# Patient Record
Sex: Female | Born: 1935
Health system: Southern US, Community
[De-identification: ages and names within clinical notes are randomized; demographics above are authoritative.]

## PROBLEM LIST (undated history)

## (undated) DIAGNOSIS — E669 Obesity, unspecified: Secondary | ICD-10-CM

## (undated) DIAGNOSIS — L509 Urticaria, unspecified: Secondary | ICD-10-CM

## (undated) DIAGNOSIS — G8929 Other chronic pain: Secondary | ICD-10-CM

## (undated) DIAGNOSIS — R569 Unspecified convulsions: Secondary | ICD-10-CM

## (undated) DIAGNOSIS — R29818 Other symptoms and signs involving the nervous system: Secondary | ICD-10-CM

## (undated) DIAGNOSIS — M109 Gout, unspecified: Secondary | ICD-10-CM

## (undated) DIAGNOSIS — M48062 Spinal stenosis, lumbar region with neurogenic claudication: Secondary | ICD-10-CM

## (undated) DIAGNOSIS — R609 Edema, unspecified: Secondary | ICD-10-CM

## (undated) DIAGNOSIS — K648 Other hemorrhoids: Secondary | ICD-10-CM

## (undated) DIAGNOSIS — G40909 Epilepsy, unspecified, not intractable, without status epilepticus: Secondary | ICD-10-CM

## (undated) DIAGNOSIS — M545 Low back pain, unspecified: Secondary | ICD-10-CM

## (undated) DIAGNOSIS — R51 Headache: Secondary | ICD-10-CM

## (undated) DIAGNOSIS — I639 Cerebral infarction, unspecified: Secondary | ICD-10-CM

## (undated) DIAGNOSIS — L309 Dermatitis, unspecified: Secondary | ICD-10-CM

## (undated) DIAGNOSIS — I10 Essential (primary) hypertension: Secondary | ICD-10-CM

## (undated) DIAGNOSIS — R519 Headache, unspecified: Secondary | ICD-10-CM

## (undated) DIAGNOSIS — K449 Diaphragmatic hernia without obstruction or gangrene: Secondary | ICD-10-CM

## (undated) DIAGNOSIS — G9519 Other vascular myelopathies: Secondary | ICD-10-CM

## (undated) DIAGNOSIS — M1990 Unspecified osteoarthritis, unspecified site: Secondary | ICD-10-CM

## (undated) HISTORY — PX: RE-EXCISION OF BREAST LUMPECTOMY: SHX6048

## (undated) HISTORY — DX: Cerebral infarction, unspecified: I63.9

## (undated) HISTORY — DX: Edema, unspecified: R60.9

## (undated) HISTORY — DX: Other symptoms and signs involving the nervous system: R29.818

## (undated) HISTORY — PX: TUBAL LIGATION: SHX77

## (undated) HISTORY — DX: Other chronic pain: G89.29

## (undated) HISTORY — DX: Urticaria, unspecified: L50.9

## (undated) HISTORY — PX: CHOLECYSTECTOMY: SHX55

## (undated) HISTORY — PX: OTHER SURGICAL HISTORY: SHX169

## (undated) HISTORY — DX: Epilepsy, unspecified, not intractable, without status epilepticus: G40.909

## (undated) HISTORY — DX: Obesity, unspecified: E66.9

## (undated) HISTORY — PX: APPENDECTOMY: SHX54

## (undated) HISTORY — DX: Low back pain: M54.5

## (undated) HISTORY — PX: ABDOMINAL HYSTERECTOMY: SHX81

## (undated) HISTORY — DX: Headache: R51

## (undated) HISTORY — DX: Other hemorrhoids: K64.8

## (undated) HISTORY — DX: Other vascular myelopathies: G95.19

## (undated) HISTORY — DX: Diaphragmatic hernia without obstruction or gangrene: K44.9

## (undated) HISTORY — DX: Unspecified osteoarthritis, unspecified site: M19.90

## (undated) HISTORY — DX: Spinal stenosis, lumbar region with neurogenic claudication: M48.062

## (undated) HISTORY — DX: Headache, unspecified: R51.9

## (undated) HISTORY — DX: Low back pain, unspecified: M54.50

## (undated) HISTORY — DX: Unspecified convulsions: R56.9

## (undated) HISTORY — PX: NEUROPLASTY / TRANSPOSITION MEDIAN NERVE AT CARPAL TUNNEL BILATERAL: SUR894

## (undated) HISTORY — DX: Gout, unspecified: M10.9

## (undated) HISTORY — DX: Dermatitis, unspecified: L30.9

## (undated) HISTORY — DX: Essential (primary) hypertension: I10

---

## 2000-03-19 ENCOUNTER — Ambulatory Visit (HOSPITAL_COMMUNITY): Admission: RE | Admit: 2000-03-19 | Discharge: 2000-03-19 | Payer: Self-pay | Admitting: Gastroenterology

## 2000-07-05 ENCOUNTER — Encounter (INDEPENDENT_AMBULATORY_CARE_PROVIDER_SITE_OTHER): Payer: Self-pay | Admitting: Specialist

## 2000-07-05 ENCOUNTER — Encounter: Payer: Self-pay | Admitting: Family Medicine

## 2000-07-05 ENCOUNTER — Encounter: Payer: Self-pay | Admitting: Internal Medicine

## 2000-07-05 ENCOUNTER — Inpatient Hospital Stay (HOSPITAL_COMMUNITY): Admission: EM | Admit: 2000-07-05 | Discharge: 2000-07-11 | Payer: Self-pay | Admitting: Emergency Medicine

## 2000-07-05 ENCOUNTER — Encounter: Payer: Self-pay | Admitting: Emergency Medicine

## 2000-07-07 ENCOUNTER — Encounter: Payer: Self-pay | Admitting: Internal Medicine

## 2000-07-09 ENCOUNTER — Encounter: Payer: Self-pay | Admitting: Internal Medicine

## 2000-08-19 ENCOUNTER — Ambulatory Visit (HOSPITAL_COMMUNITY): Admission: RE | Admit: 2000-08-19 | Discharge: 2000-08-19 | Payer: Self-pay | Admitting: Family Medicine

## 2000-08-19 ENCOUNTER — Encounter: Payer: Self-pay | Admitting: Family Medicine

## 2001-01-20 ENCOUNTER — Other Ambulatory Visit: Admission: RE | Admit: 2001-01-20 | Discharge: 2001-01-20 | Payer: Self-pay | Admitting: Family Medicine

## 2001-06-12 ENCOUNTER — Inpatient Hospital Stay (HOSPITAL_COMMUNITY): Admission: RE | Admit: 2001-06-12 | Discharge: 2001-06-16 | Payer: Self-pay | Admitting: Specialist

## 2001-06-15 ENCOUNTER — Encounter: Payer: Self-pay | Admitting: Specialist

## 2001-06-16 ENCOUNTER — Inpatient Hospital Stay (HOSPITAL_COMMUNITY)
Admission: RE | Admit: 2001-06-16 | Discharge: 2001-06-23 | Payer: Self-pay | Admitting: Physical Medicine & Rehabilitation

## 2001-07-22 ENCOUNTER — Encounter: Admission: RE | Admit: 2001-07-22 | Discharge: 2001-09-11 | Payer: Self-pay | Admitting: *Deleted

## 2001-09-10 ENCOUNTER — Ambulatory Visit (HOSPITAL_COMMUNITY): Admission: RE | Admit: 2001-09-10 | Discharge: 2001-09-10 | Payer: Self-pay | Admitting: Family Medicine

## 2001-09-10 ENCOUNTER — Encounter: Payer: Self-pay | Admitting: Family Medicine

## 2001-10-22 ENCOUNTER — Ambulatory Visit (HOSPITAL_COMMUNITY): Admission: RE | Admit: 2001-10-22 | Discharge: 2001-10-22 | Payer: Self-pay | Admitting: Gastroenterology

## 2001-10-22 ENCOUNTER — Encounter (INDEPENDENT_AMBULATORY_CARE_PROVIDER_SITE_OTHER): Payer: Self-pay | Admitting: Specialist

## 2001-12-01 ENCOUNTER — Encounter: Admission: RE | Admit: 2001-12-01 | Discharge: 2002-01-13 | Payer: Self-pay | Admitting: Specialist

## 2002-01-26 ENCOUNTER — Ambulatory Visit: Admission: RE | Admit: 2002-01-26 | Discharge: 2002-01-26 | Payer: Self-pay | Admitting: Specialist

## 2002-03-07 ENCOUNTER — Emergency Department (HOSPITAL_COMMUNITY): Admission: EM | Admit: 2002-03-07 | Discharge: 2002-03-07 | Payer: Self-pay | Admitting: *Deleted

## 2002-03-07 ENCOUNTER — Encounter: Payer: Self-pay | Admitting: *Deleted

## 2002-09-14 ENCOUNTER — Encounter: Payer: Self-pay | Admitting: Family Medicine

## 2002-09-14 ENCOUNTER — Ambulatory Visit (HOSPITAL_COMMUNITY): Admission: RE | Admit: 2002-09-14 | Discharge: 2002-09-14 | Payer: Self-pay | Admitting: Family Medicine

## 2003-02-03 ENCOUNTER — Encounter: Payer: Self-pay | Admitting: Specialist

## 2003-02-09 ENCOUNTER — Inpatient Hospital Stay (HOSPITAL_COMMUNITY): Admission: RE | Admit: 2003-02-09 | Discharge: 2003-02-15 | Payer: Self-pay | Admitting: Specialist

## 2003-02-15 ENCOUNTER — Inpatient Hospital Stay (HOSPITAL_COMMUNITY)
Admission: RE | Admit: 2003-02-15 | Discharge: 2003-02-24 | Payer: Self-pay | Admitting: Physical Medicine & Rehabilitation

## 2003-03-22 ENCOUNTER — Encounter: Admission: RE | Admit: 2003-03-22 | Discharge: 2003-06-20 | Payer: Self-pay | Admitting: Specialist

## 2003-06-21 ENCOUNTER — Encounter: Admission: RE | Admit: 2003-06-21 | Discharge: 2003-07-15 | Payer: Self-pay | Admitting: Specialist

## 2003-09-16 ENCOUNTER — Ambulatory Visit (HOSPITAL_COMMUNITY): Admission: RE | Admit: 2003-09-16 | Discharge: 2003-09-16 | Payer: Self-pay | Admitting: Family Medicine

## 2003-12-07 ENCOUNTER — Other Ambulatory Visit: Admission: RE | Admit: 2003-12-07 | Discharge: 2003-12-07 | Payer: Self-pay | Admitting: Family Medicine

## 2004-09-04 ENCOUNTER — Emergency Department (HOSPITAL_COMMUNITY): Admission: EM | Admit: 2004-09-04 | Discharge: 2004-09-04 | Payer: Self-pay | Admitting: Emergency Medicine

## 2004-10-04 ENCOUNTER — Inpatient Hospital Stay (HOSPITAL_COMMUNITY): Admission: EM | Admit: 2004-10-04 | Discharge: 2004-10-06 | Payer: Self-pay | Admitting: Emergency Medicine

## 2005-03-06 ENCOUNTER — Ambulatory Visit: Payer: Self-pay | Admitting: Orthopedic Surgery

## 2005-03-08 ENCOUNTER — Inpatient Hospital Stay (HOSPITAL_COMMUNITY): Admission: EM | Admit: 2005-03-08 | Discharge: 2005-03-14 | Payer: Self-pay | Admitting: Emergency Medicine

## 2005-03-14 ENCOUNTER — Inpatient Hospital Stay: Admission: AD | Admit: 2005-03-14 | Discharge: 2005-03-27 | Payer: Self-pay | Admitting: Pulmonary Disease

## 2006-08-11 ENCOUNTER — Emergency Department (HOSPITAL_COMMUNITY): Admission: EM | Admit: 2006-08-11 | Discharge: 2006-08-11 | Payer: Self-pay | Admitting: Emergency Medicine

## 2006-08-13 ENCOUNTER — Emergency Department (HOSPITAL_COMMUNITY): Admission: EM | Admit: 2006-08-13 | Discharge: 2006-08-13 | Payer: Self-pay | Admitting: Emergency Medicine

## 2006-09-18 ENCOUNTER — Ambulatory Visit (HOSPITAL_COMMUNITY): Admission: RE | Admit: 2006-09-18 | Discharge: 2006-09-18 | Payer: Self-pay | Admitting: Family Medicine

## 2007-01-16 ENCOUNTER — Encounter: Admission: RE | Admit: 2007-01-16 | Discharge: 2007-01-16 | Payer: Self-pay | Admitting: Neurology

## 2007-07-03 ENCOUNTER — Encounter: Admission: RE | Admit: 2007-07-03 | Discharge: 2007-07-03 | Payer: Self-pay | Admitting: Orthopedic Surgery

## 2007-08-21 ENCOUNTER — Encounter: Admission: RE | Admit: 2007-08-21 | Discharge: 2007-10-21 | Payer: Self-pay | Admitting: Specialist

## 2007-09-23 ENCOUNTER — Ambulatory Visit (HOSPITAL_COMMUNITY): Admission: RE | Admit: 2007-09-23 | Discharge: 2007-09-23 | Payer: Self-pay | Admitting: Family Medicine

## 2008-03-22 ENCOUNTER — Encounter: Admission: RE | Admit: 2008-03-22 | Discharge: 2008-04-15 | Payer: Self-pay | Admitting: Family Medicine

## 2008-04-16 ENCOUNTER — Encounter: Admission: RE | Admit: 2008-04-16 | Discharge: 2008-07-15 | Payer: Self-pay | Admitting: Family Medicine

## 2008-11-02 ENCOUNTER — Ambulatory Visit (HOSPITAL_COMMUNITY): Admission: RE | Admit: 2008-11-02 | Discharge: 2008-11-02 | Payer: Self-pay | Admitting: Family Medicine

## 2009-09-09 ENCOUNTER — Ambulatory Visit (HOSPITAL_BASED_OUTPATIENT_CLINIC_OR_DEPARTMENT_OTHER): Admission: RE | Admit: 2009-09-09 | Discharge: 2009-09-09 | Payer: Self-pay | Admitting: Orthopedic Surgery

## 2009-11-03 ENCOUNTER — Ambulatory Visit (HOSPITAL_COMMUNITY): Admission: RE | Admit: 2009-11-03 | Discharge: 2009-11-03 | Payer: Self-pay | Admitting: Family Medicine

## 2010-05-06 ENCOUNTER — Encounter: Payer: Self-pay | Admitting: Internal Medicine

## 2010-05-07 ENCOUNTER — Encounter: Payer: Self-pay | Admitting: Orthopedic Surgery

## 2010-07-03 LAB — POCT I-STAT 4, (NA,K, GLUC, HGB,HCT)
Glucose, Bld: 96 mg/dL (ref 70–99)
HCT: 37 % (ref 36.0–46.0)
Hemoglobin: 12.6 g/dL (ref 12.0–15.0)
Potassium: 3.7 mEq/L (ref 3.5–5.1)
Sodium: 142 mEq/L (ref 135–145)

## 2010-09-01 NOTE — Discharge Summary (Signed)
[name], [name]                ACCOUNT NO.:  [id number]   MEDICAL RECORD NO.:  [id number]          PATIENT TYPE:  INP   LOCATION:  A313                          FACILITY:  [facility]   PHYSICIAN:  Bonnielee Haff, MD     DATE OF BIRTH:  09-11-1923   DATE OF ADMISSION:  10/04/2004  DATE OF DISCHARGE:  06/23/2006LH                                 DISCHARGE SUMMARY   DISCHARGE DIAGNOSES:  1.  Herpes zoster involving left T11-T12 dermatomal regions, improved.  2.  Seizure disorder.  3.  Hypertension.  4.  Morbid obesity.  5.  Dehydration, resolved   Please review H&P dictated June 21 for details regarding patient's  presenting illness.   BRIEF HOSPITAL COURSE:  Problem 1.  HERPES ZOSTER:  The patient was admitted with painful lesions  which were present in the left lower abdomen and left lower back, probably  in the T11 or T12 dermatomal regions.  These lesions had been there for  about one or two weeks, and the patient was having significant problems with  the pain and with local wound care.  She lives with her husband, who was  unable to also take care of the patient because of his old age also.  The  patient was admitted to the hospital, where we started her on valacyclovir,  Neurontin and Vicodin.  She was also put on prednisone as it has shown to  improve quality of life in such individuals.  The wound care nurse also saw  the patient and made recommendations for local care.  The patient has been  doing well since her admission and was able to move about on her own without  any difficulty, and we have arranged for a home health nurse to visit the  patient tomorrow to check on her as well as for her wound care.   Problem 2.  DEHYDRATION:  The patient at the time of admission was also  found to be dehydrated with an elevated creatinine.  The patient was given  IV fluids, with which her symptoms seem to have improved and her renal  function has also improved.  At the time of  discharge the patient's vital  signs are all stable and she is able to take p.o. intake adequately.   Problem 3.  Her other medical issues, which include seizure disorder,  hypertension, all remained stable during this admission.   Once again, on the day of discharge the patient was very minimally  symptomatic, vital signs were all stable.  Her exam findings showed that the  lesions were actually draining a slight amount of blood as well as some  minimal serous drainage had improved significantly.  As mentioned above, the  wound care nurse saw the patient and put occlusive dressings on this wound,  which also seem to have helped the patient significantly.  Based on the  above, the patient was considered stable for discharge.   DISCHARGE MEDICATIONS:  1.  Valacyclovir 1 g p.o. q.12h. for five more days.  2.  Prednisone taper 30 mg for one day, followed by 20  mg for two days,      followed by 10 mg for two days and then stop.  3.  Neurontin 300 mg p.o. b.i.d. p.r.n. for pain.  4.  Vicodin 5/500 mg one to two tablets every four hours as needed for pain.  5.  The patient was asked to resume all of her other outpatient medications      except the ones which were prescribed by her physician for these      lesions.   FOLLOW-UP CARE:  1.  With Chipper Herb, M.D., as needed and if the lesions are not      improving.  2.  Home health has been set up for this patient for wound care.  A home      health nurse will be visiting the patient tomorrow to be sure that she      is doing okay at home.  A dressing change will be done for this patient      every seven days, and these instructions are also being faxed to the      home health agency.   DIET:  The patient will eat a low-salt diet.   PHYSICAL ACTIVITY:  The patient was seen by physical therapy here, and they  considered her stable and she was able to ambulate well with her walker.  She does have a walker at home.       GK/MEDQ   D:  10/06/2004  T:  10/06/2004  Job:  [id number]   cc:   Chipper Herb, M.D.  [address] [place]  [place] [address]  Fax: [phone]

## 2010-09-01 NOTE — Discharge Summary (Signed)
NAMEALZIE, [name]                          ACCOUNT NO.:  [id number]   MEDICAL RECORD NO.:  [id number]                   PATIENT TYPE:  IPS   LOCATION:  4140                                 FACILITY:  [facility]   PHYSICIAN:  Jarvis Morgan, M.D.                DATE OF BIRTH:  08-Nov-1923   DATE OF ADMISSION:  02/15/2003  DATE OF DISCHARGE:  02/24/2003                                 DISCHARGE SUMMARY   DISCHARGE DIAGNOSES:  1. Right total-knee replacement.  2. Hypertension.  3. Gastroesophageal reflux disease.  4. Neuropathy, left upper extremity.   HISTORY OF PRESENT ILLNESS:  Ms. [name] is a 77 year old female with a  history of hypertension, seizure disorder, osteoarthritis, bilateral knee  with left total knee replacement.  Now with increased pain right knee.  She  elected to undergo right total knee replacement on October 26, by Dr. Hillery Aldo.  Postop, he was weightbearing as tolerated and Coumadin for DVT  prophylaxis.   Events past surgery include problems with lethargy, question seizure  episode, and __________ was resumed.  The patient with a history of last  seizure in September 2004, with fall and left shoulder pain onset.  She has  also had problems with numbness in the left upper extremity and MRA and x-  rays done were reportedly negative.  Her shoulder was injected on October  29, but the patient continues with left shoulder pain and neuropathy that  are limiting factors.   Physical therapy has been ongoing, and the patient is total assist 50% to  transfer and take a few steps.   PAST MEDICAL HISTORY:  Significant for hypertension, depression, seizure  disorder, OA, DJD, and DVT, GERD, hysterectomy, cholecystectomy and  incontinence secondary to urgency, anemia, appendectomy, excision of  bilateral cataracts, breast reduction, hiatal hernia, rectal bleeding with  hemorrhoids, antral gastritis.   ALLERGIES:  PENICILLIN, SULFA AND MORPHINE.   SOCIAL HISTORY:   The patient is married and lives with husband in a one-  level home with five steps at entry, was independent prior to admission.  She does not use any tobacco or alcohol.   HOSPITAL COURSE:  The patient was admitted to rehabilitation on February 15, 2003, for inpatient therapy to consist of PT and OT.  Past admission,  Coumadin was continued for DVT prophylaxis.  She was also continued on  Keppra and phenobarbital for seizure prophylaxis and has been seizure free  during her stay.  Blood pressure medicines were held secondary to reports of  hypotension.  Blood pressures were monitored on a b.i.d. basis, and  initially showed good control.  As the patient's mobility improved, the  patient's blood pressure started trending upwards to systolics in [id number] to  [id number], and her Lasix and Zestoretic were resumed by the time of discharge.   LABORATORY DATA:  Labs done at admission showed hemoglobin 9.5, hematocrit  28.1, white count  8.1, platelets 204,000.  Sodium 138, potassium 4.3,  chloride 106.  PO2 27.  BUN 11, creatinine 1.0, glucose 96.   The patient was maintained on iron supplement for postoperative anemia.  Of  issue during this stay had been problems with her right shoulder, pain and  neuropathy.  She was started on Celebrex to see if this would assist with  pain control.  However, she started developing reflux with chest pain, and  this was therefore discontinued.  An EKG was done secondary to chest  discomfort.  This showed normal sinus rhythm without any signs of ischemia.  The patient's right knee incision was noted to be healing well without any  signs or symptoms of infection, no drainage nor erythema was noted.  Range  of motion of knee was limited with patient with dislike of CPM machine.  During her stay in rehab, the patient progressed to being modified  independent for transfer, modified independent for ambulating 200 feet with  a rolling walker, supervision for navigating  four stairs.  Knee flexion was  at 90 degrees actively.  The patient required assistance with sit up and  supervision for bathing.  She requires min assist for low body dressing.  She was modified independent for toileting.  Required minimum assist for  kitchen tasks.  For followup therapies to include Home Health, PT/OT by  [place] with Home [facility] R.N. arranged for pro-time,  and [facility] Pharmacist to follow Coumadin.   On February 24, 2003, the patient is discharged to home.   DISCHARGE MEDICATIONS:  1. Zestoretic one p.o. b.i.d.  2. Lexapro 10 mg daily.  3. Protonix 40 mg a day.  4. Trinsicon one p.o. b.i.d.  5. Keppra 250 mg b.i.d.  6. Phenobarbital 100 mg p.o. q.h.s.  7. OxyContin 10 mg b.i.d. x1 week and one per day until gone.  8. Coumadin 10 mg, two p.o. q.h.s.  9. Milk of Magnesia, two tablespoons daily if no bowel movement.  10.      Lasix 40 mg a day.  11.      K-Dur 20 mEq a day.  12.      Oxycodone IR 5-10 mg q.4-6h. p.r.n. for pain.  13.      Robaxin 500 mg q.i.d. p.r.n. for spasm.   DISCHARGE ACTIVITY:  1. Use a walker.  2. Diet is regular.  3. Wound care:  Keep area clean and dry.   DISCHARGE INSTRUCTIONS:  1. No alcohol, no smoking, no driving, no aspirin or aspirin-containing     products while on Coumadin.  2. Coumadin continued through March 12, 2003.   FOLLOWUP:  1. The patient to follow up with Dr. Hillery Aldo for an appointment in one     to three weeks.  2. Follow up with Dr. Jarvis Morgan as needed.      Thornton Dales, P.A.                    Jarvis Morgan, M.D.    PP/MEDQ  D:  03/15/2003  T:  03/15/2003  Job:  [id number]   cc:   Chipper Herb, M.D.  [place]  [place] [address]  Fax: [phone]   Hillery Aldo, Dr.

## 2010-09-01 NOTE — Op Note (Signed)
[place]. [facility]  Patient:    [name], [name] Visit Number: [id number] MRN: [id number]          Service Type: SUR Location: [place] 01 Attending Physician:  Cynda Familia Dictated by:   R. Hart Robinsons, M.D. Proc. Date: 06/12/01 Admit Date:  06/12/2001                             Operative Report  PREOPERATIVE DIAGNOSIS:  Left knee end-stage osteoarthritis.  POSTOPERATIVE DIAGNOSIS:  Left knee end-stage osteoarthritis.  PROCEDURE:  Left total knee arthroplasty.  SURGEON:  Daniel L. Theda Sers, M.D.  ASSISTANT:  Duncan Dull. Troncale, P.A.C.  ANESTHESIA:  General followed by a postoperative femoral nerve block.  ESTIMATED BLOOD LOSS:  Less than 50 cc.  DRAINS:  Two medium Hemovac.  COMPLICATIONS:  None.  TOURNIQUET TIME:  1 hour 20 minutes at 400 mmHg.  DISPOSITION:  To PACU stable.  OPERATIVE IMPLANTS:  Osteonics components, posterior-stabilized.  All cemented.  Size 9 femur, size 7 tibia, 10 mm polyethylene insert, and 26 mm polyethylene patella.  DESCRIPTION OF PROCEDURE:  The patient was counseled in the holding area and the correct side was identified.  IV was started.  Due to allergy, she was given vancomycin preoperatively.  The chart was reviewed and signed appropriately.  Taken to the OR, placed in the supine position, general anesthesia.  The left knee was examined in 3 degrees of recurvatum, flexed to 120.  Foley catheter placed utilizing sterile technique by the OR circulating nurse.  The left lower extremity was elevated, prepped with Duraprep, and draped in sterile fashion.  She was also appropriately padded and bumped throughout. The left lower extremity was elevated, exsanguinated with the Esmarch.  The tourniquet was inflated to 400 mmHg.  A straight midline incision made through the skin and subcutaneous tissue, medial and lateral soft tissue flaps developed at the appropriate level, and hemostasis  obtained.  Medial parapatellar arthrotomy was performed, and a proximal medial soft tissue release was secondary to the varus knee.  The knee was then flexed.  End-stage osteoarthritic changes were found to be present. The cruciate ligaments were resected.  A starter hole made in the distal femur.  Canal was irrigated, and the intramedullary rod was gently placed.  We chose a 5 degree valgus cut, took an 8 mm cut off the distal femur secondary to her recurvatum.  The distal femur was found to be a size #9.  Rotation marks were made, and the distal femur was cut to fit a size #9.  Medial and lateral menisci were removed, geniculate vessels were coagulated.  Tibial osteophytes were removed, as was the tibial eminence.  The proximal tibia was found to be a size #7.  A starter hole was made for the intramedullary rod. The step drill was utilized.  The canal was irrigated until the effluent was clear.  The intramedullary rod was gently placed.  We took a 10 mm cut based upon the lateral side and did a 5 degree posterior slope after setting rotation.  Posteromedial and posterolateral femoral osteophytes were removed under direct visualization.  Then the femoral trochlea prepared in the standard fashion.  At this point in time with a size 9 femur and size 7 tibia with a 10 mm, we had excellent range of motion and soft tissue balance in flexion and extension.  Rotational marks were made, alignment was checked, and the delta  keel was performed in standard fashion.  The patella was found to be a satisfactory thickness.  It was found to be a size 26.  It was reamed to a depth of 10 mm, locking holes were made, and excess bone was removed.  At this point in time utilizing Modern cement technique, all components were cemented in place, a size 7 tibia, size 9 femur, with a 26 mm patella.  At this point in time we put in the 10 mm insert, we allowed the cement to cure.  With the 10 mm insert we had  excellent range of motion, flexion and extension, and soft tissue balance.  Patella tracking was tilting low but not subluxing, and a modified lateral release was performed, sparing the geniculate vessels.  The knee was then flexed, the tibial trial was removed, excess cement was removed, and then the tibial insert was placed, 10 mm thickness, posterior stabilized.  Again the knee was put through a range of motion and was stable throughout.  Bone wax was placed on exposed bony surfaces.  Two medium Hemovac drains were placed.  The knee was irrigated with antibiotic solution during the closure.  A sequential closure of layers was done.  Synovium with Vicryl, arthrotomy with Vicryl, subcu Vicryl, skin closed with subcuticular Monocryl suture.  Benzoin and Steri-Strips were applied.  A sterile compressive dressing applied to the knee, the tourniquet was deflated, and normal circulation and pulse to the foot and ankle at the end of the case.  An ice pack and knee immobilizer was applied.  She also was given a femoral nerve block postoperatively for anesthesia. There were no complications.  Sponge and needle count were correct.  Awakened, extubated, taken from the operating room in stable condition. Dictated by:   R. Hart Robinsons, M.D. Attending Physician:  Cynda Familia DD:  06/12/01 TD:  06/12/01 Job: [id number]

## 2010-09-01 NOTE — Discharge Summary (Signed)
[place]. [facility]  Patient:    [name], [name] Visit Number: [id number] MRN: [id number]          Service Type: [facility] Location: [place] 02 Attending Physician:  Alger Simons T Dictated by:   Placido Sou, P.A. Admit Date:  06/16/2001 Discharge Date: 06/23/2001   CC:         R. Hart Robinsons, M.D.  Clois Dupes, M.D.   Discharge Summary  DISCHARGE DIAGNOSES: 1. Left total knee arthroplasty secondary to end-stage osteoarthritis. 2. History of hypotension. 3. History of seizure disorder. 4. Status post cellulitis.  HISTORY OF PRESENT ILLNESS:  The patient is a 77 year old female with past medical history of hypertension and seizures and end-stage osteoarthritis which failed conservative treatment and elected to undergo a left total knee replacement on June 12, 2001, by R. Hart Robinsons, M.D.  Postoperative complications significant for chest pain and anemia and anxiety. The patient received blood transfusion prior to admission to rehab.  PT report at that time indicated the patient was ambulating with close supervision with standard walker 90 feet and transfers sit to stand minimum assist. She is weightbearing as tolerated.  The patient is on Coumadin for DVT prophylaxis. The patient was transferred to [facility] Rehab Department on June 16, 2001.  PAST MEDICAL HISTORY:  Significant for hypertension, gout, anemia, GERD, peptic ulcer disease, depression, increased cholesterol and seizure disorder and hiatal hernia.  PAST SURGICAL HISTORY:  Significant for right carpal tunnel, hysterectomy, breast reduction, cholecystectomy.  ALLERGIES:  SULFA and PENICILLIN.  PRIMARY CARE PHYSICIAN:  Clois Dupes, M.D.  SOCIAL HISTORY:  The patient is married and lives in a one level home with husband. There are three to four steps to entry.  She was independent prior to admission and denied tobacco or alcohol  use.  FAMILY HISTORY:  Noncontributory.  HOSPITAL COURSE:  Mrs. Bernardy was admitted to [facility] Rehab Department on June 16, 2001, for comprehensive inpatient rehabilitation where she received more than three hours of PT and OT therapy daily.  Her six-day hospital course was significant for cellulitis, pruritus, and occasional constipation and muscle spasms. The patient remained on Coumadin throughout her entire stay for DVT prophylaxis. She was started on Lovenox on June 17, 2001, until INR was greater than 2 at 30 mg subcu q.12h.  Day #1 of rehab, the patient began to complain of severe itching all over.  She received Benadryl p.r.n. and cortisone cream as needed. This did improve the itching some.  She remained on Trinsicon p.o. b.i.d. for anemia.  Initial hemoglobin was 9.9.  She was started on Keflex 250 mg p.o. q.i.d. day #2 of rehab due to significant cellulitis of the left knee.  After addition of Keflex, cellulitis did resolve.  Blood pressure remained fairly stable while in rehab. She did demonstrate some episodes of hypotension, therefore hydrochlorothiazide was placed on hold if systolic blood pressure was less than [id number] and diastolic pressure less than 50.  For constipation, the patient received sorbitol 30 cc p.r.n. and Robaxin as needed for muscle spasms.  There was no other significant medical complications that occurred while the patient was in rehab.  Latest hemoglobin was 10.4, hematocrit 31.6, white blood cell count 8.2, platelet count 293.  She had two Hemoccults performed which were negative. Latest INR was 1.5.  Latest sodium was 132, potassium 3.7, chloride 94, CO2 31, glucose 103, BUN 25, creatinine 1.4.  She had urine culture  performed on June 21, 2001, which demonstrated no growth x1 day. At the time of discharge, the patients blood pressure was 137/56, respiratory rate 22, pulse 97 and temperature 98.3.  PT report indicated the patient  was ambulating approximately modified independently 150 feet with rolling walker and could transfer sit to stand modified independently.  She could perform ADLs modified independently. The patient had approximately only 40 degrees flexion in her left knee and she refused CPM machine several times due to pain.  She was discharged home with her family.  DISCHARGE MEDICATIONS:  1. Zestoretic 20/25 mg daily.  2. Allegra 180 mg daily.  3. Lasix 40 mg daily.  4. Allopurinol 300 mg daily.  5. Aspirin - Do not take while on Coumadin.  6. Keppra 500 mg daily.  7. Phenobarbital one half tablet _________ one and a half tablet at night.  8. Protonix 40 mg daily.  9. Zoloft 50 mg daily. 10. Oxycodone 5 mg one to two tablets every four to six hours as needed for     pain. 11. Robaxin 500 mg every six hours as needed for spasms. 12. Coumadin 15 mg in the afternoon. 13. Lovenox injections 30 mg q.12h. x3 doses. 14. Benadryl p.r.n.  ACTIVITY:  No drinking, no driving, use walker.  DIET:  Low salt.  DISCHARGE INSTRUCTIONS:  She will have [facility] for PT, OT, INR and monitor Coumadin.  First draw will be on June 24, 2001.  FOLLOW-UP:  She is to follow up with Dr. Hart Robinsons in two weeks, follow up with Alger Simons, M.D., as needed, follow up with her primary care physician within six to eight weeks, Dr. Laurance Flatten. Dictated by:   Placido Sou, P.A. Attending Physician:  Alger Simons T DD:  07/01/01 TD:  07/02/01 Job: [id number]

## 2010-09-01 NOTE — H&P (Signed)
[place]  Patient:    [name], [name] Visit Number: [id number] MRN: [id number]          Service Type: Attending:  R. Hart Robinsons, M.D. Dictated by:   Jannette Fogo. Jenean Lindau, P.A.-C. Adm. Date:  06/05/01   CC:         Dr. Littie Deeds, [place]   History and Physical  DATE OF BIRTH:  1923/11/15  SOCIAL SECURITY NUMBER:  [id number]  CHIEF COMPLAINT:  Left knee pain.  HISTORY OF THE PRESENT ILLNESS:  Ms. Kusch is a very pleasant 77 year old black female who has a long history of left knee pain that has been increasing in frequency and duration.  Patient has been seen by Dr. Alfonso Patten. Hart Robinsons here at [facility] and found to have end-stage osteoarthritis of the left knee and despite conservative treatments, i.e., steroid injections, patient continues to have continuing disability and pain in the left knee with interference of her activities of daily living now. After a lengthy discussion with the patient, decision was made to proceed with operative intervention.  Risks and benefits have been discussed with the patient in great detail.  ALLERGIES:  SULFA and PENICILLIN.  MEDICATIONS:  1. She takes Celebrex daily.  2. Zestoretic 20/25 mg one daily.  3. Allegra 180 mg one tablet daily.  4. Furosemide 40 mg one daily.  5. Allopurinol 300 mg one daily.  6. Aspirin 81 mg one daily.  7. Hemocyte Plus one tablet daily.  8. Protonix 40 mg one daily.  9. Keppra 500 mg one daily. 10. Phenobarbital 97.2 mg one and a half tablet at bedtime.  PAST MEDICAL HISTORY:  History of anemia, history of hypertension, history of gout, history of peptic ulcer disease, history of osteoarthritis, history of hypercholesterolemia, history of degenerative disk disease at L4-L5, L5-S1, history of "partial seizure disorder," history of depression, now resolved.  PAST SURGICAL HISTORY:  She has had a hysterectomy and appendectomy and  breast reduction.  SOCIAL HISTORY:  She is married with four children.  She denies any tobacco or alcohol use.  She lives in a one-level home with three steps to her entrance. She is a Oceanographer.  Her family physician is Dr. Laurance Flatten.  She is also seen by a Mrs. Margarito Liner who apparently is a Designer, jewellery who works closely with Dr. Laurance Flatten.  FAMILY HISTORY:  Noncontributory.  REVIEW OF SYSTEMS:  In general, she says that she chills and freezes easily but no history of fever, recent weight loss.  HEENT:  She does wear dentures as well as corrective lenses.  She has occasional tinnitus in the left ear. No history of chronic sore throat or runny nose.  CHEST:  Patient states she is recently getting over a chest cold, has been coughing up some whitish-to-yellowish phlegm.  She had been seen and evaluated by Dr. Laurance Flatten for this and this is improving.  Otherwise, she denies any history of a chronic cough, history of a chronic productive cough or hemoptysis.  CARDIOVASCULAR: No history of chest pains, irregular heart beats.  GI/GU:  Had a short bout of diarrhea, status post her appendectomy, which now has resolved.  She denies any history of chronic constipation.  No problems with urination. EXTREMITIES:  Please see HPI.  NEUROLOGIC:  She has a history of "partial seizures."  PHYSICAL EXAMINATION:  VITAL SIGNS:  Her blood pressure is 110/70, respirations are 16, pulse is 82.  GENERAL:  This is an extremely pleasant 77 year old  black female in no acute distress.  HEENT:  Head is atraumatic and normocephalic.  NECK:  Supple without masses or carotid bruits.  CHEST:  Clear to auscultation bilaterally.  No wheezing or rhonchi noted.  BREASTS:  Examination deferred, not pertinent to present illness.  HEART:  Regular rate and rhythm.  S1 and S2.  ABDOMEN:  Soft.  Bowel sounds are positive.  No guarding.  No rebound.  GENITOURINARY:  Deferred, not pertinent to present  illness.  EXTREMITIES:  Patient has an antalgic gait.  Left knee has range of motion of 0 to about 125 degrees, no palpable effusion.  She does have some pain with range of motion.  LABORATORY AND ACCESSORY DATA:  Radiographs show end-stage osteoarthritis of the left knee with bone-on-bone contact.  IMPRESSION:  1. Left knee osteoarthritis.  2. History of anemia.  3. History of hypertension.  4. Gout.  5. Peptic ulcer disease.  6. Osteoarthritis.  7. Hypercholesterolemia.  8. Degenerative disk disease, L4-L5, L5-S1.  9. History of depression. 10. History of seizure disorder.  PLAN:  Patient will be admitted to [facility] to undergo a left total knee arthroplasty per Dr. Theda Sers on February 27th at 7:30 a.m.  She had recently seen Dr. Laurance Flatten for a preoperative medical clearance; we are currently awaiting this via fax.  All her questions have been answered for today. Dictated by:   Jannette Fogo. Jenean Lindau, P.A.-C. Attending:  R. Hart Robinsons, M.D. DD:  06/05/01 TD:  06/05/01 Job: [id number]

## 2010-09-01 NOTE — H&P (Signed)
NAMEJACKLYN, RUESCH                ACCOUNT NO.:  [id number]   MEDICAL RECORD NO.:  [id number]          PATIENT TYPE:  INP   LOCATION:  A325                          FACILITY:  [facility]   PHYSICIAN:  Debbe Odea, M.D.     DATE OF BIRTH:  [birth year]   DATE OF ADMISSION:  03/06/2005  DATE OF DISCHARGE:  LH                                HISTORY & PHYSICAL   PRIMARY CARE PHYSICIAN:  Chipper Herb, MD.   ORTHOPEDIC SURGEON:  Cynda Familia, MD.   PODIATRIST:  Doreatha Lew. Irving Shows, DPM.   NEUROLOGISTJill Alexanders, MD, in [place].   PRESENTING COMPLAINT:  Pain in bilateral ankles and inability to walk for  two days.   HISTORY OF PRESENT ILLNESS:  This is a 77 year old, African-American female  with a past medical history of obesity and arthritis.  She has had bilateral  total knee replacements over the past two years.  She states that she had  been walking with a cane, however, was having more physical pain and  therefore resorted to a walker.  Over this past weekend, she was even having  trouble with the walker.  She states that she has had severe pain in her  ankles and in her feet.  The pain is described as a burning.  She is unable  to flex and extend her ankle and over the past two days she has been unable  to bear any weight on her ankles, therefore, has not been standing or  walking.   She states that there has been some swelling in both ankles.  She has a  history of gout and wonders if this is gout.  She has no complaint of  fevers, no complaint of leg swelling, no numbness or tingling in her  extremities, no pain radiating from her back down to her legs.   All other review of systems is negative.   PAST MEDICAL HISTORY:  1.  Episode of herpes zoster involving T11 and T12 dermatomes in June of      2006.  2.  Hypertension.  3.  Seizure disorder.  4.  Morbid obesity.  5.  Arthritis.  6.  History of gout.   PAST SURGICAL HISTORY:  1.  Bilateral total knee  replacements over this past two years.  2.  Cholecystectomy.  3.  Appendectomy.  4.  Breast reduction.   ALLERGIES:  1.  PENICILLIN, which causes a rash.  2.  SULFA, which causes a rash.  Note:  She is not allergic to morphine as mentioned in her previous H&P.   MEDICATIONS:  1.  Potassium 20 mEq daily.  2.  Keppra 500 mg a half tab b.i.d.  3.  Lasix 40 mg b.i.d.  4.  Lisinopril 20 mg daily.  5.  Metolazone 2.5 mg daily.  6.  Aspirin 81 mg daily.  7.  Zetia 10 mg daily.  8.  Celebrex 200 mg daily.  9.  Lipitor 10 mg daily.  10. Patanol drops daily.  11. Multivitamins one tab daily.   SOCIAL HISTORY:  She lives  in [place] with her husband.  She is a nonsmoker  and nonalcoholic.   FAMILY HISTORY:  Significant for heart disease, hypertension, glaucoma,  diabetes, and prostate cancer.   PHYSICAL EXAMINATION:  VITAL SIGNS:  Temperature 98.2 degrees, blood  pressure 121/66, pulse 91, respiratory rate 21, pulse-ox 99% on room air.  GENERAL:  This is an obese, African-American female, who currently appears  to be lying comfortably in bed.  HEENT:  Atraumatic, normocephalic.  Pupils are equal, round, and reacting to  light.  Oral mucosa is moist.  NECK:  Supple.  HEART:  Regular rate and rhythm.  No murmurs.  LUNGS:  Clear bilaterally.  ABDOMEN:  Soft, nontender, nondistended.  Bowel sounds are positive.  EXTREMITIES:  No cyanosis, clubbing, or edema.  RHEUMATOLOGIC AL EXAM:  There are bilateral scars over her knees.  There is  no swelling of her knees.  Her ankles, there is some slight edema over her  lateral malleolus of her right ankle.  There is no edema or swelling in the  left ankle.  There is tenderness to pressure on both ankles.  On the left  ankle, the dorsal area has a firm, protruding mass, which is non-mobile and  mild to moderately tender.  There is no loss of pinprick or touch sensation  in her feet.  She is able to flex and extend the ankles with severe pain.   NEUROLOGIC:  Strength is 5/5 in all four extremities.  Reflexes are intact.  Sensations are intact as well.   BLOOD WORK:  WBC count is 11, hemoglobin 10.1, hematocrit 31.2, platelets  178.  Sodium 137, potassium 3.6, chloride 101, bicarb 27, glucose 110, BUN  11, creatinine 1.2.  LFTs are within normal limits.  Phenobarbital level is  31.  Urine is negative for infection.   Chest x-ray done in the ER shows cardiac enlargement with mild right lower  lobe atelectasis.  CT scan without contrast shows no acute intracranial  abnormalities.  There is mild mucosal thickening in the sphenoid sinus.   ASSESSMENT AND PLAN:  This is a 77 year old, African-American lady with  severe arthritis and pain in bilateral ankles, which is limiting her  mobility.  She does not look as if she is having an attack of gout; however,  a uric acid level can be checked.  I will continue her on pain medication.  She received morphine in the emergency room, which will be continued.  I  will obtain a consult with Dr. Aline Brochure in the morning.  The patient states  that she  has received injections in both ankles by her podiatrist in the past, which  has helped with the pain, but she is unsure of what type of medication it  was.  She also received Solu-Medrol 125 mg while she was in the emergency  room.  I will hold off on continuing this for now.      Debbe Odea, M.D.  Electronically Signed     SR/MEDQ  D:  03/06/2005  T:  03/06/2005  Job:  [id number]

## 2010-09-01 NOTE — Op Note (Signed)
[name], [name] NO.:  [id number]   MEDICAL RECORD NO.:  [id number]                   PATIENT TYPE:  INP   LOCATION:  X005                                 FACILITY:  [facility]   PHYSICIAN:  Cynda Familia, M.D.         DATE OF BIRTH:  [birth year]   DATE OF PROCEDURE:  02/09/2003  DATE OF DISCHARGE:                                 OPERATIVE REPORT   PREOPERATIVE DIAGNOSIS:  Right knee end-stage osteoarthritis.   POSTOPERATIVE DIAGNOSIS:  Right knee end-stage osteoarthritis.   PROCEDURE:  Right total knee arthroplasty.   SURGEON:  Cynda Familia, M.D.   ASSISTANT:  Judith Part. Chabon, P.A.   ANESTHESIA:  General.   ESTIMATED BLOOD LOSS:  Less than 100 mL.   DRAINS:  Two medium Hemovac.   COMPLICATIONS:  None.   TOURNIQUET TIME:  2 hours at 375 mmHg.   DISPOSITION:  To PACU stable.   OPERATIVE IMPLANTS:  Osteonics components, posterior-stabilized, all  cemented.  Size 7 femur, size 7 tibia, 10 mm flex insert, 26 patella.   OPERATIVE DETAILS:  The patient was counseled in the holding area and the  correct side was identified, IV started, antibiotics were given, vancomycin  due to a significant PENICILLIN allergy.  She was then taken to the  operating room and placed in the supine position.  She had chosen general  anesthesia with the anesthesiologist prior to surgery and it was her choice,  and it was approved and administered by the anesthesiologist.  He felt it  would be appropriate and safe to perform with a general anesthetic.  Following this a Foley catheter was placed utilizing sterile technique by  the OR circulating nurse.  The body was properly padded and bumped  throughout.  The right lower extremity was elevated.  She had a 3 degree  flexion contracture with flexion to 125 degrees.  She was elevated, prepped  with Duraprep, and all draped in a sterile fashion, exsanguinated with an  Esmarch, and the tourniquet was  elevated to 375 mmHg.   A straight midline incision made through the skin and subcutaneous tissues,  and small veins electrocoagulated.  Medial and lateral soft tissue flaps  were taken at the appropriate level.  A medial parapatellar arthrotomy was  performed and a proximal medial soft tissue release was done.  The knee was  then flexed, the patella was everted.  End-stage osteoarthritic change, bone-  against-bone contact in all compartments with appropriate synovitis.  Synovectomy was performed.   Cruciate ligaments were resected.  A starter hole made in the distal femur  and the canal was irrigated until the effluent was clear.  The  intramedullary rod was then placed, making sure it was well-ventilated.  We  chose a 10 mm cut off the distal femur with a 5 degree valgus angle.  This  was found to be a size #7.  Rotation marks were made,  the distal femur cut  to a size #7.  Proximal tibial osteophytes were removed, the tibial eminence  was resected.  Medial and lateral menisci were removed under direct  visualization.  Geniculate vessels were coagulated.  Posterior neurovascular  structures were sought out protected throughout the entire case.  The  proximal tibia was found to be a size #7.  A starter hole was made, step  reamer was utilized.  The canal was then thoroughly irrigated until the  effluent was clear.  The intramedullary rod was then gently placed down the  vented canal.  I initially took a 0 degree slope with a 2 mm cut off the  defect, then supplemented that with another 2 mm off the proximal tibia.  Posteromedial and posterolateral femoral osteophytes were removed under  direct visualization.  The femoral trochlea was prepared in the standard  fashion.  Size 7 femur, size 7 tibia, with a 10 mm flex insert was applied,  posterior-stabilized.  At this point in time we had excellent range of  motion and soft tissue balance in flexion and extension.  The patellar  tracking  was excellent.  She had excellent alignment, and rotation marks  were made along the proximal tibia and the delta keel was performed in the  standard fashion.   The patella was found to be a size 26.  The osteophytes were removed, reamed  to the appropriate depth.  Locking holes were made, and excess bone was  removed.   The knee was then thoroughly irrigated with pulsatile lavage and we utilized  Modern cement technique.  I then cemented in a size 7 tibia, a size 7 femur,  with a 26 patella.  We initially did trials of 10 and 12 mm thickness.  With  the 10 mm thickness we had range of motion of 0-130 degrees, only limited by  the drapes.  She was well-balanced in flexion and extension, and  patellofemoral tracking was anatomic.   The trial was then removed, excess cement was removed.  Final hemostasis was  obtained.  Bone was placed over the exposed bony surfaces, and a 10 mm flex  insert was applied, posterior-stabilized.   Two medium Hemovac drains were placed.  A sequential closure in layers was  done, the arthrotomy closed with Vicryl, subcu Vicryl, skin closed with  subcuticular Monocryl suture.  Short Steri-Strips were applied very loosely  without Benzoin.  Each layer was irrigated with antibiotic solution during  the closure.  Anesthesia 25 mL of 0.5% Marcaine with epinephrine was placed  through the drain to the knee joint.  A sterile compressive dressing was  applied, the tourniquet was deflated.  She had excellent clinical alignment  at the end of the case.  She had normal pulses to the foot and ankle.  An  ice pack was applied, knee immobilizer.  She was then awakened.  She was  taken from the operating room to PACU in stable condition.  Sponge and  needle count were correct.  There were no complications.   ADDENDUM:  The anesthesiologist has already discussed with her and will determine if she is going to get a femoral nerve block in the PACU.                                                Cynda Familia, M.D.  RAC/MEDQ  D:  02/09/2003  T:  02/09/2003  Job:  [id number]

## 2010-09-01 NOTE — Discharge Summary (Signed)
[place]  Patient:    [name], [name] Visit Number: [id number] MRN: [id number]          Service Type: SUR Location: [id number] Attending Physician:  Cynda Familia Dictated by:   Elodia Florence Clabe Seal, P.A. Admit Date:  06/12/2001 Disc. Date: 06/16/01                             Discharge Summary  ADMITTING DIAGNOSES: 1. End-stage osteoarthritis of the left knee. 2. Hypertension. 3. Multiple allergies. 4. History of seizures. 5. Hiatal hernia/reflux. 6. Osteoarthritis.  DISCHARGE DIAGNOSES: 1. End-stage osteoarthritis of the left knee. 2. Mild postoperative anemia, post transfusion. 3. Hypertension. 4. Multiple allergies. 5. History of seizures. 6. Hiatal hernia/reflux. 7. Osteoarthritis.  OPERATION:  On June 12, 2001 the patient underwent left total knee replacement arthroplasty with Duncan Dull. Sharion Dove, P.A.C. assisting.  CONSULTS:  Dr. Alger Simons of rehabilitation medicine.  BRIEF HISTORY:  This 77 year old lady with progressive deteriorating osteoarthritis of her left knee seen by [place] for continuing problems.  She is a very heavy lady and this of course contributed to the deterioration in the face of osteoarthritis.  We counseled her and Dr. Theda Sers explained the complications of surgery.  It was felt that she would benefit from a surgical procedure and she was scheduled for admission for the total knee replacement.  COURSE IN THE HOSPITAL:  The patient tolerated the surgical procedure quite well.  She was very slow early on with her rehabilitation.  She had a moderate amount of pain and discomfort.  She also had some urticarial-type rashes which was thought to be in all probability secondary to her bed sheets and perspiration.  We used Benadryl primarily for this.  We had to transfuse her 2 units of blood as her hemoglobin dropped from 11.2 to 8.1.  This resolved nicely.  The patient was ambulating in the  hall a good 40 feet at the time of transfer.  The wound was dry.  On June 15, 2001 about 9 p.m. the patient had some substernal chest pain with left arm pain.  Troponin I was done which was normal; CK-MB was slightly elevated.  Electrocardiogram was no change.  It was felt that the patients main problem with her substernal pain was esophageal in nature due to her history of reflux.  The left arm was the site of a previous IV infiltration. Heat was applied to the left arm and reassurance, as well as Mylanta was ordered for her substernal chest pain.  I discussed with her on June 16, 2001 the probable nature of her discomfort, reassured her that in all probability it was not cardiac in origin.  She seemed relieved.  The left arm showed no edema, neurovascular was intact, excellent grip.  She had good range of motion of the left shoulder and elbow and wrist as well.  Once it was known that she would have a bed in the rehab program, we made arrangements for her to be transferred.  She can continue weightbearing as tolerated.  LABORATORY VALUES IN THE HOSPITAL:  Preoperative CBC within normal limits with a slight anemia at hemoglobin 11.2, hematocrit 34.4.  Final hemoglobin 9.8, hematocrit 30.0.  Electrolytes were normal.  Urinalysis was negative for urinary tract infection.  Chest x-ray preoperatively showed no acute distress.  Electrocardiogram showed sinus tachycardia with occasional premature supraventricular complexes and this was x 2 with a repeat  mentioned above.  CONDITION ON DISCHARGE:  Improved/stable.  PLAN:  The patient is discharged to [place] for an inpatient rehab program.  She is to have any medical concerns addressed by the rehab physicians.  We are more than happy and plan to follow along with her during her rehab stay. Dictated by:   Elodia Florence. Clabe Seal, P.A. Attending Physician:  Cynda Familia DD:  06/16/01 TD:  06/16/01 Job:  [id number]

## 2010-09-01 NOTE — Procedures (Signed)
[facility]  Patient:    [name], [name] Visit Number: [id number] MRN: [id number]          Service Type: END Location: ENDO Attending Physician:  Rafael Bihari Dictated by:   Elyse Jarvis Amedeo Plenty, M.D. Proc. Date: 10/22/01 Admit Date:  10/22/2001 Discharge Date: 10/22/2001   CC:         Dr. Sherrlyn Hock   Procedure Report  PROCEDURE:  Esophagogastroduodenoscopy.  INDICATIONS FOR PROCEDURE:  Epigastric abdominal pain and anemia with previous diarrhea recently improved. Negative colonoscopy within the last year or two.  DESCRIPTION OF PROCEDURE:  The patient was placed in the left lateral decubitus position then placed on the pulse monitor with continuous low flow oxygen delivered by nasal cannula. She was sedated with sedated with 40 mcg IV Demerol and 5 mg IV Versed. The Olympus video endoscope was advanced under direct vision into the oropharynx and esophagus. The esophagus was straight and of normal caliber with the squamocolumnar line at 38 cm. There was no visible hiatal hernia, ring, stricture, or other abnormality of the GE junction. The stomach was entered and a small amount of liquid secretions were suctioned from the fundus. Retroflexed view of the cardia was unremarkable. The fundus and body appeared normal. The antrum showed some parallel streaks of erythema extending several centimeters proximal to the pylorus with no focal erosions or ulcers. The pylorus was nondeformed and easily allowed passage of the endoscope tip into the duodenum. Both the bulb and second portion are well inspected and appear to be within normal limits. The scope was advanced as far as possible down the post bulbar duodenum and biopsies were taken to rule out celiac disease. The scope was withdrawn into the stomach and a CLOtest obtained. The scope was then withdrawn and the patient returned to the recovery room in stable condition. The patient tolerated the procedure  well and there were no immediate complications.  IMPRESSION:  Antral gastritis otherwise normal endoscopy.  PLAN:  Await all biopsies. Will continue proton pump inhibitor recently started for now. Dictated by:   Elyse Jarvis Amedeo Plenty, M.D. Attending Physician:  Rafael Bihari DD:  10/22/01 TD:  10/24/01 Job: [id number]

## 2010-09-01 NOTE — H&P (Signed)
[name], [name]                ACCOUNT NO.:  [id number]   MEDICAL RECORD NO.:  [id number]          PATIENT TYPE:  EMS   LOCATION:  ED                            FACILITY:  [facility]   PHYSICIAN:  Bonnielee Haff, MD     DATE OF BIRTH:  June 12, 1923   DATE OF ADMISSION:  10/04/2004  DATE OF DISCHARGE:  LH                                HISTORY & PHYSICAL   PRIMARY MEDICAL DOCTOR:  Dr. Laurance Flatten.  Dr. Jannifer Franklin in [place], her neurologist.  Dr. Theda Sers, orthopedic surgeon.   ADMISSION DIAGNOSES:  1.  Herpes zoster involving the left T11-12 dermatome regions.  2.  Hypertension.  3.  Seizure disorder.  4.  Morbid obesity.   CHIEF COMPLAINT:  Left-sided chest pain for the past 3 weeks.   HISTORY OF PRESENT ILLNESS:  This is a 77 year old African-American female  with a past medical history of hypertension, seizure disorder, morbid  obesity who says she was doing well until about 2-3 weeks ago when she  started noticing sharp character pain in the left side of her abdomen.  The  pain used to fleeting, come and go, never constant.  They still radiate down  to her legs as well as up to her chest at times.  All of the pain was  located mostly on the left side.  She presented to the emergency department  at that time, and I see a note in the computer system from Sep 04, 2004 at  which time patient was thought to have pain related to her back problems.  However, a few days later the patient presented to her primary doctor with  similar complaints, and she was at that time noted to have shingles.  The  primary doctor prescribed acyclovir and Lyrica and other pain medications on  September 25, 2004 and had the patient follow up.  Patient noted blister  formation more than 10 days ago on her lower abdomen and her lower back on  the left side only.  These blisters were initially fluid-filled, and they  burst with some yellowish drainage but no blood.  Patient's pain has been  uncontrollable over the past  few days.  Her husband stays with her; however,  he has back problems, and he has been unable to really care for the patient  because of his own medical problems.  Patient has had very poor p.o. intake  in the past few days.  She has not been able to ambulate because of her pain  and feels very dehydrated.  Because of all these issues the patient again  came to the ER.  Currently is mentioning about 8/10 pain, sharp and shooting  in character.  Even light touch over the area with her cloth causes intense  lightning type of pain.  She denies any shortness of breath, palpitations,  diaphoresis.  She denies any urinary problems.  She denies any nausea,  vomiting, abdominal pain or diarrhea.  The patient did not give a history of  fever or chills at home.   MEDICATIONS:  1.  Potassium chloride 20 mEq  daily.  2.  Keppra 500 mg b.i.d.  3.  Lasix 40 mg b.i.d.  4.  Calcium citrate.  5.  Lisinopril 20 mg daily.  6.  Metolazone 7.5 mg daily.  7.  Aspirin 81 mg daily.  8.  Zetia 10 mg daily.  9.  Multivitamin.  10. Phenobarbital 2 tablets 97.2 mg at bedtime.  11. Acyclovir, Vicodin and Lyrica prescribed on September 25, 2004.   ALLERGIES:  Patient is allergic to PENICILLIN, SULFA, MORPHINE SULFATE.   PAST MEDICAL HISTORY:  1.  Seizure disorder for which she sees a neurologist, Dr. Jannifer Franklin at      [facility].  2.  Hypertension.  3.  Multiple joint problems status post bilateral total knee replacements.  4.  Patient has had a cholecystectomy and appendectomy in the past.  5.  She has had breast reduction surgery, and the surgical scar was      complicated by cheloid formation.  6.  Patient also has a history of hiatal hernia.  7.  She does not give any history of myocardial infarction or any cancers.      She does mention that she may have had fibroids in her uterus.   SOCIAL HISTORY:  Patient lives in [place] with her husband.  She does not  smoke, does not drink alcohol, no illicit drug use.   She usually uses a cane  to ambulate; however, for the past few days has been needing a walker.   FAMILY HISTORY:  Significant for heart disease, hypertension, glaucoma,  diabetes and prostate cancer.   REVIEW OF SYSTEMS:  A 10-point review of systems was done which was negative  except as mentioned under the HPI.   PHYSICAL EXAMINATION:  VITAL SIGNS:  Temperature 98.2, blood pressure  102/68, pulse 92, respiratory rate 16, oxygen saturation 98% on room air.  GENERAL:  This is a very morbidly obese female, very pleasant, very  talkative and in slight discomfort because of pain at this time.  She has  been medicated by the ED physician.  HEENT:  There is no pallor, no icterus.  Oral mucous membranes are very dry.  No oral lesions are seen.  NECK:  Soft and supple.  LUNGS:  Clear to auscultation bilaterally, no wheezes, rales or rhonchi  present.  CARDIOVASCULAR:  S1 and S2 are normal, regular, no murmurs appreciated.  ABDOMEN:  Very obese, soft, nontender and nondistended.  No mass or  organomegaly present.  EXTREMITIES:  Show mild edema bilaterally.  SKIN:  There are extensive open sores seen over her lower left abdomen and  lower left back, dermatomes T11-12.  No real blister formation is seen at  this time.  Some of these open sores are draining yellowish material.  These  sores, some of them are tender to palpation.  However, there is no warmth  over any of this area.  No lesions in any other dermatomal region are seen.  NEUROLOGIC:  Patient is grossly intact.  Alert and oriented x3.   DIAGNOSTIC STUDIES:  CBC showed white count to be 8.4 with a normal  differential.  Hemoglobin was 10.1, MCV 73, platelet count 203.  Sodium 137,  potassium 5.0, chloride 100, bicarb 32, glucose 113, BUN 36, creatinine 1.6.  Patient had a normal BUN and creatinine about a year ago.  Calcium 9.5.  No LFTs are available at this time.  No other lab results are available.  No  imaging studies have  been done.   IMPRESSION:  This  is a 77 year old African-American female with a history of  hypertension, seizure disorder and a remote history of chickenpox as a child  who presents with left-sided lower abdomen and lower back sharp neuropathic  pain.  She does seem to have a herpes zoster infection.  She was prescribed  acyclovir by her primary doctor; however, she presented to the ER because of  poorly controlled pain and difficulty in taking care of herself.  Patient  appears to be clinically dehydrated.  She does have elevation in her BUN and  creatinine which could be acute.   PLAN:  1.  Herpes zoster.  Ideally antiviral medication should be started within 48-      72 hours of symptom onset; however, in an elderly patient it think it      would be prudent to continue this at this time.  We will switch her over      to valacyclovir from her acyclovir.  Because of her renal insufficiency      dosage will be adjusted.  Patient will also be given a short course of      steroids which has been shown to improve symptoms and improve quality of      life.  Local care will be given in the form of wet-to-dry dressings.  I      will also give the patient analgesic agents.  We will give her Neurontin      to control the neuropathic pain.  None of the wounds look infected to me      at this time.  We will monitor patient closely for any fever.  No      antibacterials indicated at this time.  2.  Seizure disorder.  We will continue the patient on phenobarbital and      Keppra.  3.  Hypertension.  I will hold the patient's antihypertensive agents at this      time.  She is dehydration and I would like her to be hydrated before we      re-initiate her medications.  4.  Mild acute renal failure.  This appears to be secondary to poor p.o.      intake and prerenal azotemia.  We will put the patient on IV hydration      gently and repeat a BMP in the morning.   Further management of the patient will  be based on results of initial  testing and patient's response to treatment.       GK/MEDQ  D:  10/04/2004  T:  10/04/2004  Job:  [id number]   cc:   Chipper Herb, M.D.  [place]  [place] [address]  Fax: [phone]

## 2010-09-01 NOTE — Discharge Summary (Signed)
[name], [name]                ACCOUNT NO.:  [id number]   MEDICAL RECORD NO.:  [id number]          PATIENT TYPE:  INP   LOCATION:  A325                          FACILITY:  [facility]   PHYSICIAN:  Karlyn Agee, M.D. DATE OF BIRTH:  02/15/1924   DATE OF ADMISSION:  03/06/2005  DATE OF DISCHARGE:  11/29/2006LH                                 DISCHARGE SUMMARY   PRIMARY CARE PHYSICIAN:  Chipper Herb, M.D.   CONSULTATIONS:  Dr. Aline Brochure, orthopedist.   DISCHARGE DIAGNOSES:  1.  Acute flare of polyarticular gout.  2.  Osteoarthritis.  3.  History of seizure disorder.  4.  History of hyperlipidemia.  5.  History of depression.  6.  Acute renal insufficiency, resolved.   DISPOSITION:  Discharged to a skilled nursing facility, [facility]:  Stable.   DISCHARGE MEDICATIONS:  1.  Prednisone 20 mg b.i.d. x3 days, then 20 mg daily x3 days, then 10 mg      daily x3 days, then 5 mg daily x3 days, then stop.  2.  Indomethacin 50 mg t.i.d.  3.  Phenobarbital 100 mg at bedtime.  4.  Keppra 250 mg b.i.d.  5.  Lipitor 10 mg each evening.  6.  Aspirin 81 mg daily.  7.  Zetia 10 mg daily.  8.  Patanol eye drops 0.1% both eyes daily.  9.  Citracal with vitamin D one daily.  10. Lisinopril 20 mg daily.  11. Multivitamins one daily.  12. Protonix 40 mg b.i.d.  13. Ambien 5 to 10 mg at bedtime p.r.n. for insomnia.  14. Colace 100 mg b.i.d. p.r.n. for constipation.   HOSPITAL COURSE:  Please refer also to the admission history and physical.  This is a 77 year old African-American lady with a history including morbid  obesity, arthritis, status post bilateral total knee replacement over the  past two years, and also a history of gout, who presented with severe pains  in both ankles and the right shoulder interfering with her ability to walk,  the pain being worse in the left ankle.  The patient was admitted on  observation for pain control, but when examined the  following morning, the  left ankle was exquisitely inflamed and tender to touch, so much so that  even the bed clothes caused severe pains.  The patient's serum uric acid was  noted to be elevated, and the patient had the benefit of a consult with  orthopedic surgeon to consider aspiration of the joint to test for uric acid  crystals.  The surgeon felt that it would be unwise to attempt aspiration of  the joint and she should best be treated empirically for gout.  The patient  was treated initially with intravenous Solu-Medrol, and as she continued to  require increasing doses of morphine, Indomethacin was added to the regimen.  The patient developed acute renal insufficiency with Indomethacin and  required hydration.  Also, Lasix, which she was using at home for the  swelling of her feet, was discontinued.  Since that time, the patient has  been controlled on  a tapering dose of steroids and intermittent doses of  Indomethacin.  She has received intensive GI prophylaxis because of the  doses of Indocid that she has required.   On the patient's admission records, she indicated that she was taking  Celebrex at home, but on further questioning she reports that she is  supposed to be taking it, but she takes it only intermittently.  In the  initial phase of her hospitalization Celebrex did not help her acute  inflammation.   During the course of her hospitalization, the patient was started on Lexapro  and Klonopin because it was felt that she showed signs of depression and  maybe bipolar disorder.  It is unclear whether she has this diagnosis, but  after discussion with herself and her son, it was considered best to  discontinue these medications, and she could be followed up by a  psychiatrist as an outpatient to see if she does indeed need these  medications.  Since being in the hospital, she has had no overt episodes of  psychosis and has been appropriate and normal.  She was not taking   antidepressants or sedatives as an outpatient.  Today, the patient is alert,  oriented, and calm.  She is cooperating with physical therapy.  Yesterday,  she was able to walk from her bed to the door with assistance.  Her vital's  this morning are temperature 97.5, pulse 79, respirations 18, blood pressure  128/72.  Pupils are round and equal.  Her chest is clear.  She has no  respiratory distress.  Her cardiovascular system is regular rhythm.  Her  abdomen is soft and nontender.  Her joints:  Both ankles are somewhat  swollen and tender, the left more so than the right, but they can be  palpated without her squirming.  Right shoulder has markedly increased  mobility, she can now lift the hand above her head.   LABORATORY DATA:  Her white count has normalized, it is now 9.4, hemoglobin  is stable at 9.1, MCV is 73, platelets of 262.  Her sodium is 134, potassium  3.8, chloride 103, CO2 of 26, glucose 108, BUN 15, creatinine 1.  Her  calcium is 8.5.   X-rays:  The patient had x-rays of her ankle and shoulder which showed no  fracture, evidence of osteoporosis and mild soft tissue swelling without  bony abnormalities.  She did have prominent calcaneal spurs at the plantar  aspect and Achilles tendon of her right ankle.  She did not have a calcaneal  spur on her left foot.   FOLLOWUP:  The patient is to be followed up by a rheumatologist to be  assigned, appointment to be made tomorrow.  She has agreed to have a  psychiatric assessment as an outpatient, and an appointment can be made at  such time;  however, she is having no acute psychiatric issues.  The patient  is to be followed up by her primary care physician.   45 minutes of personal attention     Karlyn Agee, M.D.  Electronically Signed    LC/MEDQ  D:  03/13/2005  T:  03/13/2005  Job:  [id number]

## 2010-09-01 NOTE — H&P (Signed)
[name], [name]                          ACCOUNT NO.:  [id number]   MEDICAL RECORD NO.:  [id number]                   PATIENT TYPE:  INP   LOCATION:  NA                                   [facility]ACILITY[facility]  [facility]   PHYSICIAN:  Cynda Familia, M.D.         DATE OF BIRTH:  05-22-1923   DATE OF ADMISSION:  02/09/2003  DATE OF DISCHARGE:                                HISTORY & PHYSICAL   CHIEF COMPLAINT:  Right knee osteoarthritis.   HISTORY OF PRESENT ILLNESS:  [name] who is well known to  [place] with previous bilateral knee osteoarthritis.  She has undergone total  knee arthroplasty on the left knee with good result and due to continued  pain and failure of conservative therapy to manage her discomfort on her  right knee is now scheduled for a total knee arthroplasty of the right knee.  Surgery, risks, benefits, and after care were reviewed with the patient.  Questions were invited and answered and surgery go ahead as scheduled.  She  has gotten clearance by her medical doctor for surgery under spinal  anesthesia.   ALLERGIES:  PENICILLIN, SULFA, MORPHINE.   CURRENT MEDICATIONS:  1. Lexapro 10 mg daily.  2. Lasix 40 mg p.o. q.a.m.  3. KCL 20 mEq one daily.  4. Zestoretic 20/25 one daily.  5. Theogen one daily.  6. Hematinic Plus one daily.  7. Protonix 40 mg one daily.  8. Phenobarbital 100 mg two q.h.s.   PAST SURGICAL HISTORY:  1. Left total knee arthroplasty.  2. Appendectomy.  3. Hysterectomy.  4. Cholecystectomy.  5. Breast reduction.  6. Bilateral cataracts.   PAST MEDICAL HISTORY:  1. Hypertension.  2. Anemia.  3. Seizures.  4. Depression.  5. GERD.  6. Hiatal hernia.   FAMILY HISTORY:  Positive for hypertension, coronary artery disease, CVA,  and cancer.   REVIEW OF SYSTEMS:  CENTRAL NERVOUS SYSTEM:  Positive for history of seizure  disorder on phenobarbital.  PULMONARY:  Negative for shortness of breath,  PND, orthopnea.   CARDIOVASCULAR:  No chest pain, palpitations.  GASTROINTESTINAL:  Positive for GERD.  GENITOURINARY:  Negative for urinary  tract difficulty.  MUSCULOSKELETAL:  Positive as in HPI.   PHYSICAL EXAMINATION:  VITAL SIGNS:  Blood pressure 140/84, respirations 18,  pulse 88 and regular.  GENERAL:  This is a well-developed, well-nourished obese lady in no acute  distress.  HEENT:  Head:  Normocephalic.  Nose patent.  Ears patent.  Pupils are equal,  round, and reactive to light.  Throat without injection.  NECK:  Supple without adenopathy.  Carotids 2+ without bruit.  CHEST:  Clear to auscultation.  No rales or rhonchi.  Respirations 18.  HEART:  Regular rate and rhythm at 88 beats per minute without murmur.  ABDOMEN:  Soft with active bowel sounds.  No masses or organomegaly.  NEUROLOGIC:  The patient alert and oriented to  time, place, and person.  Cranial nerves II-XII grossly intact.  EXTREMITIES:  Within normal limits with the exception of left knee status  post total knee arthroplasty with 0-115 degree range of motion and good  stability.  The right knee shows swelling and crepitation with range of  motion 0-120 degrees with pain.  Dorsalis pedis and posterior tibialis  pulses are 2+ and skin is in good repair.   LABORATORIES:  X-rays show end-stage osteoarthritis right knee.   IMPRESSION:  Osteoarthritis right knee.   PLAN:  Total knee arthroplasty right knee.     Judith Part. Chabon, P.A.                   Cynda Familia, M.D.    SJC/MEDQ  D:  02/03/2003  T:  02/03/2003  Job:  [id number]

## 2010-09-01 NOTE — Consult Note (Signed)
[facility]  Patient:    [name], [name]                       MRN: [id number] Proc. Date: 07/06/00 Adm. Date:  [id number] Attending:  Tera Helper CC:         Jairo Ben, M.D.  Clois Dupes, M.D.   Consultation Report  REQUESTING PHYSICIAN:  Jairo Ben, M.D.  REASON FOR CONSULTATION:  Abdominal pain, gallstones, elevated liver function tests.  HISTORY OF PRESENT ILLNESS:  [name] is a 77 year old female who recently has noted having some substernal pain following meals.  It is sharp and pressure type.  She ate some turnip greens the night of her admission and had severe pain in the epigastric and substernal area.  It was associated with nausea and vomiting and some chills.  She then had some bouts of diarrhea. She went to see Dr. Redge Gainer, who sent her to the emergency department. She was subsequently admitted.  She underwent a cardiac evaluation with troponin, enzymes, and EKG.  A myocardial infarction was ruled out.  She was noted to have elevated liver function tests and elevated white blood cell count on admission and an ultrasound done, which demonstrated a contracted gallbladder with multiple gallstones.  The common bile duct diameter was 4 mm. Her liver function tests have declined toward a normal value since admission and her white blood cell count has declined as well.  Currently, she is pain free.  PAST MEDICAL HISTORY: 1. Hypertension. 2. Hiatal hernia. 3. Gout. 4. Hyperlipidemia. 5. Seizure disorder.  PAST SURGICAL HISTORY:  Abdominal hysterectomy, appendectomy, bilateral cataract extractions, mammoplasty.  ALLERGIES: 1. PENICILLIN. 2. SULFA.  CURRENT MEDICATIONS: 1. Clindamycin. 2. Lorazepam. 3. Phenobarbital. 4. Tequin. 5. Keppra. 6. Protonix. 7. Demerol p.r.n. 8. Phenergan p.r.n.  SOCIAL HISTORY:  No tobacco or alcohol use.  REVIEW OF SYSTEMS:  Cardiovascular:  She has  known hypertension but she had ruled out for an MI by enzymes and EKG.  Pulmonary:  No chronic lung disease. GI:  Known hiatal hernia and she has intermittent constipation.  PHYSICAL EXAMINATION:  GENERAL:  Obese female in no acute distress.  Very pleasant and cooperative.  VITAL SIGNS:  She is afebrile.  HEENT:  Her eyes are not icteric.  NECK:  Supple without palpable masses.  ABDOMEN:  Soft, mild right upper quadrant and epigastric tenderness.  There is a lower midline scar.  No palpable masses noted.  IMPRESSION:  Abdominal, epigastric, and substernal chest pain; very likely secondary to biliary colic.  She may have had transient choledocholithiasis. Liver function tests are returning to normal.  She also could have some subacute cholecystitis, currently improved on antibiotics.  RECOMMENDATIONS:  I have told her I feel likely her symptoms are secondary to gallbladder disease and have recommended a laparoscopic cholecystectomy.  I did explain the procedure and the risks including but not limited to bleeding, infection, common bile duct injury, hepatic injury with bile leak, intestinal injury, and the risk of the anesthesia.  She seems to understand all these and would like to think about the potential of having the operation.  I told her I would come by tomorrow morning and speak with her again regarding this. DD:  07/06/00 TD:  07/08/00 Job: [id number]

## 2010-09-01 NOTE — Op Note (Signed)
[facility]  Patient:    [name], HENSHALL                         MRN: [id number] Proc. Date: 03/19/00 Attending:  Elyse Jarvis. Amedeo Plenty, M.D. CC:         Clois Dupes, M.D.   Operative Report  PROCEDURE:  Colonoscopy.  GASTROENTEROLOGIST:  Elyse Jarvis. Amedeo Plenty, M.D.  INDICATIONS FOR PROCEDURE:  Recent onset of rectal bleeding with some anemia.  DESCRIPTION OF PROCEDURE:   The patient was placed in the left lateral decubitus position and placed on the pulse monitor with continuous low-flow oxygen delivered by nasal cannula.  She was sedated with 60 mg IV Demerol and 6 mg IV Versed.  The Olympus video colonoscope was inserted into the rectum and advanced to the cecum, confirmed by transillumination of McBurneys point and visualization of the ileocecal valve and appendiceal orifice.  The prep was good.  The cecum, ascending, transverse, descending, and sigmoid colon all appeared normal with no masses, polyps, diverticula, or other mucosal abnormalities.  The rectum likewise appeared normal on retroflexed view.  The anus did reveal some small internal hemorrhoids.  The colonoscope was then withdrawn, and the patient returned to the recovery room in stable condition. She tolerated the procedure well, and there were no immediate complications.  IMPRESSIONS:  Internal hemorrhoids, otherwise normal colonoscopy.  PLAN:  Treat hemorrhoids symptomatically. DD:  03/19/00 TD:  03/19/00 Job: [id number]

## 2010-09-01 NOTE — Discharge Summary (Signed)
[name], [name]                          ACCOUNT NO.:  [id number]   MEDICAL RECORD NO.:  [id number]                   PATIENT TYPE:  INP   LOCATION:  [id number]                                 FACILITY:  [facility]   PHYSICIAN:  Cynda Familia, M.D.         DATE OF BIRTH:  1923-12-05   DATE OF ADMISSION:  02/09/2003  DATE OF DISCHARGE:  02/15/2003                                 DISCHARGE SUMMARY   ADMISSION DIAGNOSIS:  Osteoarthritis, end stage, of right knee.   DISCHARGE DIAGNOSIS:  Osteoarthritis, end stage, of right knee.   OPERATION:  Total knee arthroplasty, right knee.   BRIEF HISTORY:  [name] well known to [place] with previous  bilateral knee osteoarthritis. She has undergone total knee arthroplasty of  the left knee with good result and due to continued pain failure and concern  of management of her right knee. She is now scheduled for total knee  arthroplasty of the right knee. The surgery, benefits, and risks were  discussed with the patient, answers were invited and answered, and surgery  was to go ahead as scheduled. She has medical clearance by her medical  doctor for surgery under spinal anesthesia.   LABORATORY VALUES:  Admission CBC showed hemoglobin low at 11.8, hematocrit  35.4, otherwise within normal limits.  Her hemoglobin and hematocrit reached  a low of 8.5 and 26.3 on 29th, but were back up to 9.6 and 29.8 on February 15, 2003. Her BMET  was within normal limits on admission.  She had some  hyponatremia that was treated and corrected during her hospitalization and  her glucose ran elevated in the 130s to 150s throughout the admission.   COURSE IN THE HOSPITAL:  The patient tolerated the operative procedure well.  She was seen by the [facility] hospitalist and medical management was undertaken by  them.  On postoperative visit the patient was feeling fine. She had some  moderate pain. Vital signs were stable. She was afebrile. Moderate drainage  was noted in the Hemovac. Dressing was dry.  Neurovascular status was intact  in the foot. Lungs were clear. The patient was alert and oriented.  On the  first postoperative day, the patient was complaining of an appropriate level  of pain. Vital signs were stable and she was afebrile. Hemovac showed  decreasing drainage and it was discontinued intact. Sensation and  circulation were intact in the foot. Hemoglobin was 9.4. Sodium was 139.  The patient was started in physical therapy.  On the second postoperative  day, the patient was feeling okay. She had noted some pain in her left  shoulder. Her vital signs were stable. No shortness of breath or chest pain.  The lungs were clear. Bowel sounds were sluggish. Calves were negative.  Dressing was changed and the wound was benign. Hemoglobin was 9.4,  hematocrit 28.3. BMET showed hyponatremia and this was treated by the  medical service.  Her left shoulder showed a positive impingement test,  positive rapid abduction test and pain with rotation. Neurovascular status  was intact and radial and ulnar pulses were 2+. Radiographic studies of her  left shoulder were obtained. The patient was noted to have hypotension and  tachycardia later in the day. She was given IV normal saline bolus by the  medical service and her BP medications were held, and this normalized. Her  shoulder x-ray later that evening showed a type 2+ acromion and ACR  osteoarthritis. She was subsequently injected with Xylocaine/Kenalog mixture  into the subacromial bursa without complication. On the third postoperative  day, she was feeling okay, her shoulder was better, her vital signs were  stable, and she was still mildly tachycardic. Her temperature was 100.1.  Input and output were good. Hemoglobin 8.5, hematocrit 26.3. She was still  hyponatremic. Her dressing was changed and wound was benign. She had no calf  tenderness or cords. Her shoulder range of motion was increased  with  decreased pain. Her lungs were clear. Bowel sounds were sluggish. Due to her  anemia and tachycardia, she was transfused two units of packed cells.  The  third postoperative day, the patient was doing well, pain was well  controlled, and she denied any nausea and vomiting.  She was on Coumadin  protocol and hemoglobin/hematocrit levels were pending and later were found  to have a hemoglobin of 11.7. She started having increased discomfort in her  shoulder and some numbness and tingling down her arm. There was concern of a  neurologic problem versus a cervical degenerative disk disease and CT of the  brain and MRI of the neck were read by the hospitalist.  Otherwise, her knee  was doing well. Her wound was benign, her calves were negative, and  sensation and circulation were intact. Range of motion was good. She was  noted to have reflexes of 1+ in the biceps, triceps, and brachial radialis  in the left arm and a slightly decreased grip strength in the left arm. Her  shoulder was feeling better. We are awaiting the results of her CT of head  and MRI of the neck. At this point the patient was stable for transfer to  the rehab floor for further rehabilitation of her knee  and her neck will be  followed on the rehab floor.  The patient was subsequently transferred to  the rehab floor in stable condition.     Judith Part. Chabon, P.A.                   Cynda Familia, M.D.    SJC/MEDQ  D:  03/16/2003  T:  03/16/2003  Job:  [id number]

## 2010-09-01 NOTE — Op Note (Signed)
[place]  Patient:    [name], [name]                       MRN: [id number] Proc. Date: 07/09/00 Adm. Date:  [id number] Attending:  Tera Helper CC:         Clois Dupes, M.D.             Jairo Ben, M.D.                           Operative Report  PREOPERATIVE DIAGNOSIS:  Acute cholecystitis.  POSTOPERATIVE DIAGNOSIS:  Acute cholecystitis.  PROCEDURE:  Laparoscopic cholecystectomy with intraoperative cholangiogram.  SURGEON:  Odis Hollingshead, M.D.  FIRST ASSISTANT:  Jaci Carrel, M.D.  SECOND ASSISTANT:  Cleopatra Cedar, M.D.  ANESTHESIA:  General.  INDICATION:  This 77 year old female was admitted July 06, 2000.  She was having some substernal chest pain following meals.  She went to see Dr. _____ and was sent to the emergency department.  She was initially admitted by Dr. Boyce Medici to the medical service and had a myocardial infarction ruled out. Abdominal ultrasound demonstrated a contracted gallbladder with multiple stones.  She had elevated liver function tests and white blood cell count. She was placed on antibiotics.  She completed her cardiac workup and has been treated for a urinary tract infection as well and now presents for cholecystectomy.  The procedure and the risks, including bleeding, infection, bile duct injury, hepatic injury, small intestinal injury, and the risks of anesthesia, were explained to her.  DESCRIPTION OF PROCEDURE:  She was placed supine on the operating table, and a general anesthetic was administered.  The abdomen was sterilely prepped and draped.  A subumbilical incision was made, incising the skin sharply, dissecting the subcutaneous tissue bluntly.  A 1.5 cm incision was made in the midline fascia.  The peritoneal cavity was entered bluntly and under direct vision.  A pursestring suture of 0 Vicryl was placed around the fascial edges. A Hasson trocar was introduced into  the peritoneal cavity, and a pneumoperitoneum was created by insufflation with CO2 gas.  The laparoscope was introduced into the abdominal cavity, and an edematous, erythematous gallbladder was noted.  An 11 mm trocar was placed through an epigastric incision and two 5 mm trocars placed to the right midabdomen.  The fundus of the gallbladder was grasped and retracted toward the right shoulder. The infundibulum was grasped, and adhesions between the omentum and gallbladder were taken down bluntly and sharply.  Next, using careful blunt dissection and staying on the gallbladder, I was able to identify the cystic duct and the cystic artery.  The cystic artery was covered by an enlarged, edematous lymph node.  I mobilized the infundibulum and isolated the cystic duct.  I then placed a clip at the cystic duct-gallbladder junction and made an incision in the cystic duct.  A cholangiocatheter was then passed through the anterior abdominal wall and into the cystic duct, and a cholangiogram was performed.  Under real time fluoroscopy, 50% contrast material was injected through the cystic duct and promptly went into the common bile duct and drained into the duodenum.  I did not see any obvious evidence of biliary obstruction.  The cholangiocatheter was removed.  The cystic duct was clipped three times proximally and divided.  The cystic artery was identified, clipped, and divided.  I then dissected the gallbladder free  from the liver bed with the cautery.  There were multiple bleeding points, which I had to control with the cautery.  The gallbladder continued to be edematous and inflamed.  The gallbladder was removed from the liver bed intact.  I re-examined the liver bed, irrigated it, and then cauterized most of the gallbladder fossa.  I then placed Surgicel in the gallbladder fossa, but at this point it was hemostatic, and there was no evidence of bile leak.  The gallbladder was placed in  an Endopouch bag and removed through the subumbilical port.  The perihepatic area was irrigated, and the irrigation fluid was drained.  There was no obvious bile leak or bleeding.  Subsequently, the trocars were removed and the pneumoperitoneum was released. The subumbilical fascial defect was closed by tightening up and tying down the pursestring suture.  The skin incisions were closed with 4-0 Monocryl subcuticular stitches, followed by Steri-Strips and sterile dressings.  She tolerated the procedure well without any apparent complications and was taken to recovery in satisfactory condition. DD:  07/09/00 TD:  07/10/00 Job: [id number]

## 2010-09-01 NOTE — Consult Note (Signed)
NAMEMARKENZIE, [name]                ACCOUNT NO.:  [id number]   MEDICAL RECORD NO.:  [id number]          PATIENT TYPE:  INP   LOCATION:  A325                          FACILITY:  [facility]   PHYSICIAN:  Carole Civil, M.D.DATE OF BIRTH:  Aug 01, 1923   DATE OF CONSULTATION:  03/07/2005  DATE OF DISCHARGE:                                   CONSULTATION   ORTHOPEDIC SURGERY CONSULTATION:   REQUESTING PHYSICIAN:  Consult was requested by Dr. Megan Salon.   HISTORY:  This is a 77 year old female status post bilateral total knee  replacements by Dr. Theda Sers in [place].  She is ambulatory with a cane,  approximately Thursday of last week started having difficulty ambulating and  went to a walker, complains of bilateral foot and ankle pain and inability  to stand.  She describes burning pain bottom of her feet, increased warmth  and swelling of both ankles.  She does have a history of gout.   PAST MEDICAL HISTORY:  She has a past medical history of herpes zoster  dermatomes T11 and 12, hypertension, seizure disorder, morbid obesity,  arthritis, gout.  Other surgeries cholecystectomy, appendectomy, breast  reduction surgery.  Allergy to PENICILLIN and SULFA - each causing a rash.   MEDICATIONS:  Potassium, Keppra, Lasix, lisinopril, metolazone, aspirin,  Zetia, Celebrex, Lipitor, Patanol Drops, multivitamin.   SOCIAL HISTORY:  She lives in [place] with her husband, does not smoke or  drink.   FAMILY HISTORY:  Family history of heart disease, hypertension, glaucoma,  diabetes and prostate cancer.   EXAMINATION:  VITAL SIGNS:  Temperature:  She was afebrile on admission.  Blood pressure was stable.  Pulse was normal.  Respiratory rate 21.  GENERAL EXAM:  Mildly obese woman who was lying flat in bed  appeared to be  comfortable.  Development, grooming, hygiene were normal.  PERIPHERAL VASCULAR SYSTEM:  Normal pulse, perfusion, temperature and no  major edema.  SKIN:  Warm, dry, intact.  No  rash, ulceration or lesions.  LYMPH NODES:  Normal in the lower extremities.  SENSATION:  Hypersensitivity to touch plantar aspect of both feet.  UPPER EXTREMITIES:  Range of motion, strength, stability, alignment normal  except for left small finger which had a flexion contracture.  She says she  fractured that about 2-3 weeks ago.  She also has a stiff right index finger  with tenderness over the A1 pulley consistent with flexor tenosynovitis.   LOWER EXTREMITIES:  Two knees anterior incisions consistent with total knee  replacements, range of motion 0 to 115, perhaps 120.  No instability noted.   Muscle tone is good.  She does have swelling of the ankle.  No joint  effusion.  Her ankles feel hot and warm.  She has painful range of motion to  each.  There is hypersensitivity at the plantar aspect of the foot.   X-RAYS:  Radiographs show no fracture.  There is some spurring noted on the  medial malleolus.   LABORATORY WORK:  Her lab work shows her white count is 11.  Other labs seem  to be normal.  RECOMMENDATIONS:  Recommend sed rate, uric acid and then start oral or IV  steroids for presumed gout.  Medicate with analgesics as needed.      Carole Civil, M.D.  Electronically Signed     SEH/MEDQ  D:  03/07/2005  T:  03/07/2005  Job:  [id number]

## 2010-09-01 NOTE — Discharge Summary (Signed)
[place]  Patient:    [name], [name]                       MRN: [id number] Adm. Date:  [id number] Disc. Date: [id number] Attending:  Tera Helper Dictator:   Helayne Seminole, P.A. CC:         Clois Dupes, M.D.  Odis Hollingshead, M.D.   Discharge Summary  DISCHARGE DIAGNOSES:  1. Fever.  2. Leukocytosis.  3. Left arm pain.  4. Cervical disk disease.  5. Elevated liver function tests.  6. Cholelithiasis.  7. Microcytic anemia.  HISTORY OF PRESENT ILLNESS:  [name] is a 77 year old African-American female who was seen by her primary physician on the morning of admission with complaints of chest pain.  The patient reports feeling weak all week.  On the day prior to admission after lunch she developed increased bloating and belching.  She developed vomiting.  The patient was unable to keep anything down.  She had no relief with Maalox.  She then developed left chest and epigastric pain that lasted all night.  She also complained of pain in her left shoulder that radiated down to her left hand, with numbness in her first and second fingers on the left hand.  She did not have any associated dyspnea or diaphoresis.  The patient also complained of diarrhea, having initially loose stools, progressing to watery stools.  The patient has been essentially n.p.o. since July 04, 2000.  She was evaluated by Dr. Laurance Flatten this morning and noted to be hypotensive.  She was sent to the [facility] Emergency Department for evaluation of her left arm pain and to rule out angina.  PAST MEDICAL HISTORY:  1. Hypertension.  2. Hiatal hernia.  3. Bilateral carpal tunnel disease.  4. Lower extremity edema.  5. Gout.  6. Hyperlipidemia.  7. Left knee osteoarthritis with a Bakers cyst.  8. Seizure disorder.  9. Status post appendectomy. 10. Total abdominal hysterectomy secondary to fibroids. 11. Bilateral cataract repair. 12. Mammoplasty.  HOSPITAL  COURSE: #1 - INFECTIOUS DISEASE:  The patient presented with a febrile illness and a white count of 20,000.  The patient also had elevated LFTs, and we were concerned about biliary colic.  The patient did have an abdominal ultrasound that did reveal cholelithiasis.  We empirically started the patient on IV Tequin, IV Protonix, and antiemetics.  We did ask for a surgery consult, and Dr. Zella Richer did see the patient.  His impression was that her abdominal, epigastric, and substernal chest pain were likely secondary to biliary colic. He did feel she may have had a transient ______ , however, liver function tests were normalizing.  He did feel she probably subacute cholecystitis that has improved with antibiotics.  He did feel that she would need a cholecystectomy. The patient underwent a laparoscopic cholecystectomy with intraoperative cholangiogram that confirmed acute calculus cholecystitis. This was performed on July 09, 2000, and the patient has done well since surgery, without any postoperative complications.  The patient has received a total of seven days of antibiotics, and these will be discontinued at discharge.  She is afebrile, and her white count has normalized.  #2 - LEFT ARM PAIN:  This seemed to be different from the chest pain she was having, and she seemed to have some radicular symptoms.  Therefore, we did obtain an MRI of her cervical spine.  This revealed a central disk herniation at C2-3 and a central  spur at C3-4, with some left foraminal stenosis.  There was also marked left foraminal stenosis at C4-5 due to spurring and possibly a disk herniation.  There was left-sided disk herniation at C5-6 with associated spondylosis.  #3 - MICROCYTIC ANEMIA:  The patient did also have anemia.  Her hemoglobin dropped from 12.2 to 9.7, possibly acutely exacerbated by her surgery. Currently, iron studies are pending, but we will empirically start the patient on iron at  discharge.  DISCHARGE LABORATORY DATA:  CBC and iron studies are pending.  Her last hemoglobin was 9.7.  Total cholesterol was 152, triglycerides 110, HDL 56, LDL 74.  DISCHARGE MEDICATIONS: 1. Aspirin 81 mg q.d. 2. Elavil 25 mg q.h.s. 3. Zestoretic 20/25 q.d. 4. Premarin 0.625 mg q.d. 5. Keflex 500 mg b.i.d. 6. Zoloft 50 mg q.d. 7. Allegra 60 mg b.i.d. 8. Iron sulfate 325 mg b.i.d.  9. Phenobarbital as at home. 10. Lasix 40 mg 1/2 to 1 tablet as at home. 11. Protonix 40 mg q.d. 12. Tylenol for pain.  FOLLOW-UP:  The patient is to follow up with Dr. Laurance Flatten in two to three weeks and with Dr. Zella Richer in two weeks. DD:  07/11/00 TD:  07/12/00 Job: [id number]

## 2012-04-07 ENCOUNTER — Other Ambulatory Visit: Payer: Self-pay | Admitting: Nephrology

## 2012-04-07 DIAGNOSIS — N040 Nephrotic syndrome with minor glomerular abnormality: Secondary | ICD-10-CM

## 2012-04-07 DIAGNOSIS — I10 Essential (primary) hypertension: Secondary | ICD-10-CM

## 2012-04-18 ENCOUNTER — Ambulatory Visit
Admission: RE | Admit: 2012-04-18 | Discharge: 2012-04-18 | Disposition: A | Discharge status: Home / Self Care | Payer: Self-pay | Source: Ambulatory Visit | Attending: Nephrology | Admitting: Nephrology

## 2012-04-18 DIAGNOSIS — N040 Nephrotic syndrome with minor glomerular abnormality: Secondary | ICD-10-CM

## 2012-04-18 DIAGNOSIS — I10 Essential (primary) hypertension: Secondary | ICD-10-CM

## 2012-07-02 ENCOUNTER — Other Ambulatory Visit: Payer: Self-pay | Admitting: Neurology

## 2012-07-05 ENCOUNTER — Other Ambulatory Visit: Payer: Self-pay | Admitting: *Deleted

## 2012-07-05 NOTE — Telephone Encounter (Signed)
Chart has uloric 40mg . Will send chart back for review. Thanks.

## 2012-07-07 ENCOUNTER — Telehealth: Payer: Self-pay | Admitting: Family Medicine

## 2012-07-07 MED ORDER — FEBUXOSTAT 80 MG PO TABS: ORAL_TABLET | ORAL | Status: DC

## 2012-07-07 NOTE — Telephone Encounter (Signed)
Patient is completely out of two medications. Uloric Acid 80mg  tab once daily and Furosemide 80mg  1tab  In am and 1 at noon. She needs these as soon as possible.

## 2012-07-08 ENCOUNTER — Other Ambulatory Visit: Payer: Self-pay

## 2012-07-08 MED ORDER — FEBUXOSTAT 80 MG PO TABS: ORAL_TABLET | ORAL | Status: DC

## 2012-07-29 ENCOUNTER — Telehealth: Payer: Self-pay | Admitting: Nurse Practitioner

## 2012-07-29 NOTE — Telephone Encounter (Signed)
Ok for [employer]

## 2012-07-29 NOTE — Telephone Encounter (Signed)
PLEASE ADVISE.

## 2012-07-29 NOTE — Telephone Encounter (Signed)
Oops benicar samples not crestor

## 2012-07-29 NOTE — Telephone Encounter (Signed)
SAMPLES UP FRONT

## 2012-07-31 ENCOUNTER — Other Ambulatory Visit: Payer: Self-pay

## 2012-07-31 MED ORDER — OLMESARTAN MEDOXOMIL 20 MG PO TABS: 20.0000 mg | ORAL_TABLET | Freq: Every day | ORAL | Status: DC

## 2012-08-21 ENCOUNTER — Ambulatory Visit (INDEPENDENT_AMBULATORY_CARE_PROVIDER_SITE_OTHER): Payer: Medicare Other | Admitting: Nurse Practitioner

## 2012-08-21 VITALS — BP 139/83 | HR 91 | Temp 97.9°F | Ht 65.5 in | Wt 279.0 lb

## 2012-08-21 DIAGNOSIS — K449 Diaphragmatic hernia without obstruction or gangrene: Secondary | ICD-10-CM | POA: Insufficient documentation

## 2012-08-21 DIAGNOSIS — R569 Unspecified convulsions: Secondary | ICD-10-CM

## 2012-08-21 DIAGNOSIS — I10 Essential (primary) hypertension: Secondary | ICD-10-CM

## 2012-08-21 DIAGNOSIS — N61 Mastitis without abscess: Secondary | ICD-10-CM

## 2012-08-21 MED ORDER — CEFTRIAXONE SODIUM 1 G IJ SOLR
1.0000 g | INTRAMUSCULAR | Status: AC
  Administered 2012-08-21: 1 g via INTRAMUSCULAR

## 2012-08-21 MED ORDER — CEPHALEXIN 500 MG PO CAPS: 500.0000 mg | ORAL_CAPSULE | Freq: Four times a day (QID) | ORAL | Status: DC

## 2012-08-21 NOTE — Progress Notes (Signed)
  Subjective:    Patient ID: [name], female    DOB: Jul 25, 1923, 79 y.o.   MRN: [id number]  HPI- Patient in C/O rash on her breast. Noticed it this AM. Painful to touch. No nipple discharge    Review of Systemssignificant covered in HPI     Objective:   Physical Exam  Pulmonary/Chest: Right breast exhibits skin change (10 cm annular erythematos hot to touch area right breast) and tenderness. Right breast exhibits no inverted nipple and no mass. Nipple discharge: nipple removed with breast reduction. Left breast exhibits no inverted nipple, no mass, no nipple discharge, no skin change and no tenderness.            Assessment & Plan:  1. Infection of breast, right cool compresses RTO office tomorrow for echeck - cefTRIAXone (ROCEPHIN) injection 1 g; Inject 1 g into the muscle now. - cephALEXin (KEFLEX) 500 MG capsule; Take 1 capsule (500 mg total) by mouth 4 (four) times daily.  Dispense: 30 capsule; Refill: 0  Mary-Margaret Hassell Done, [name]

## 2012-08-22 ENCOUNTER — Encounter: Payer: Self-pay | Admitting: Nurse Practitioner

## 2012-08-22 ENCOUNTER — Ambulatory Visit (INDEPENDENT_AMBULATORY_CARE_PROVIDER_SITE_OTHER): Payer: Medicare Other | Admitting: Nurse Practitioner

## 2012-08-22 VITALS — BP 167/111 | HR 110 | Temp 97.5°F | Ht 65.0 in | Wt 279.0 lb

## 2012-08-22 DIAGNOSIS — N61 Mastitis without abscess: Secondary | ICD-10-CM

## 2012-08-22 DIAGNOSIS — N611 Abscess of the breast and nipple: Secondary | ICD-10-CM

## 2012-08-22 MED ORDER — CEFTRIAXONE SODIUM 1 G IJ SOLR
1.0000 g | INTRAMUSCULAR | Status: AC
  Administered 2012-08-22: 1 g via INTRAMUSCULAR

## 2012-08-22 NOTE — Progress Notes (Signed)
  Subjective:    Patient ID: [name], female    DOB: 11-Mar-1924, 79 y.o.   MRN: [id number]  HPI- Patient in tday for recheck of right breast abscess. She was given a rocephin shot yesterday an [employer] administrator for [employer]. Patient said that are started draining yesterday.    Review of Systems  All other systems reviewed and are negative.       Objective:   Physical Exam  Constitutional: She appears well-developed and well-nourished.  Cardiovascular: Normal rate and normal heart sounds.   Pulmonary/Chest: Effort normal.  Skin:  Erythema decreasing right breast- Cooler to touch then it was yesterday  No drainage noted   BP 167/111  Pulse 110  Temp(Src) 97.5 F (36.4 C) (Oral)  Ht 5\' 5"  (1.651 m)  Wt 279 lb (126.554 kg)  BMI 46.43 kg/m2        Assessment & Plan:  Abscess right breast  Rocephin 1g IM now  Continue Keflex as rx  If no better by Monday RTO  Need mmmogram when infection clears.  Mary-Margaret Hassell Done, [name]

## 2012-09-29 ENCOUNTER — Other Ambulatory Visit: Payer: Self-pay | Admitting: Neurology

## 2012-09-29 ENCOUNTER — Telehealth: Payer: Self-pay | Admitting: Nurse Practitioner

## 2012-09-30 NOTE — Telephone Encounter (Signed)
Ok FOR SAMPLES

## 2012-09-30 NOTE — Telephone Encounter (Signed)
Ok to give if available?

## 2012-09-30 NOTE — Telephone Encounter (Signed)
Samples given to daughter

## 2012-10-02 ENCOUNTER — Other Ambulatory Visit: Payer: Self-pay | Admitting: Neurology

## 2012-10-03 NOTE — Telephone Encounter (Signed)
Patient has appt scheduled

## 2012-10-30 ENCOUNTER — Other Ambulatory Visit: Payer: Self-pay

## 2012-10-30 MED ORDER — FEBUXOSTAT 80 MG PO TABS: ORAL_TABLET | ORAL | Status: DC

## 2012-11-17 ENCOUNTER — Telehealth: Payer: Self-pay | Admitting: Nurse Practitioner

## 2012-11-18 MED ORDER — OLMESARTAN MEDOXOMIL 20 MG PO TABS: 20.0000 mg | ORAL_TABLET | Freq: Every day | ORAL | Status: DC

## 2012-11-18 NOTE — Telephone Encounter (Signed)
Samples up front please send in RX

## 2012-11-18 NOTE — Telephone Encounter (Signed)
rx sent to pharmacy

## 2012-11-18 NOTE — Telephone Encounter (Signed)
Patient aware samples up front and benicar rx sent to pharmacy

## 2012-12-09 ENCOUNTER — Other Ambulatory Visit: Payer: Self-pay | Admitting: Neurology

## 2012-12-10 ENCOUNTER — Other Ambulatory Visit: Payer: Self-pay

## 2012-12-10 MED ORDER — PHENOBARBITAL 97.2 MG PO TABS: 97.2000 mg | ORAL_TABLET | Freq: Every day | ORAL | Status: DC

## 2012-12-10 NOTE — Telephone Encounter (Signed)
Patient has an appt scheduled

## 2012-12-10 NOTE — Telephone Encounter (Signed)
Rx signed and faxed.

## 2013-01-06 ENCOUNTER — Ambulatory Visit (INDEPENDENT_AMBULATORY_CARE_PROVIDER_SITE_OTHER): Payer: Medicare Other | Admitting: General Practice

## 2013-01-06 ENCOUNTER — Encounter: Payer: Self-pay | Admitting: General Practice

## 2013-01-06 ENCOUNTER — Ambulatory Visit: Payer: Medicare Other | Admitting: Family Medicine

## 2013-01-06 VITALS — BP 147/82 | HR 101 | Temp 97.9°F | Ht 65.0 in | Wt 268.0 lb

## 2013-01-06 DIAGNOSIS — I10 Essential (primary) hypertension: Secondary | ICD-10-CM

## 2013-01-06 DIAGNOSIS — Z833 Family history of diabetes mellitus: Secondary | ICD-10-CM

## 2013-01-06 DIAGNOSIS — Z8639 Personal history of other endocrine, nutritional and metabolic disease: Secondary | ICD-10-CM

## 2013-01-06 DIAGNOSIS — Z09 Encounter for follow-up examination after completed treatment for conditions other than malignant neoplasm: Secondary | ICD-10-CM

## 2013-01-06 DIAGNOSIS — M25519 Pain in unspecified shoulder: Secondary | ICD-10-CM

## 2013-01-06 DIAGNOSIS — M542 Cervicalgia: Secondary | ICD-10-CM

## 2013-01-06 DIAGNOSIS — Z862 Personal history of diseases of the blood and blood-forming organs and certain disorders involving the immune mechanism: Secondary | ICD-10-CM

## 2013-01-06 DIAGNOSIS — G8929 Other chronic pain: Secondary | ICD-10-CM

## 2013-01-06 MED ORDER — OLMESARTAN MEDOXOMIL 20 MG PO TABS: 20.0000 mg | ORAL_TABLET | Freq: Every day | ORAL | Status: DC

## 2013-01-06 MED ORDER — OLOPATADINE HCL 0.2 % OP SOLN: 1.0000 [drp] | Freq: Every day | OPHTHALMIC | Status: DC | PRN

## 2013-01-06 NOTE — Progress Notes (Signed)
  Subjective:    Patient ID: [name], female    DOB: 1923-09-16, 79 y.o.   MRN: [id number]  HPI Patient presents today for chronic health check up. She reports having some redness to right eye, which she noticed on Sunday but has gradually gotten better. She reports using some pataday eye drops. Patient has a history of seizure, hypertension, and anemia. Reports seizure management is by her neurologist.     Review of Systems  Constitutional: Negative for fever and chills.  HENT: Negative for neck pain and neck stiffness.   Respiratory: Negative for chest tightness and shortness of breath.   Cardiovascular: Negative for chest pain and palpitations.  Gastrointestinal: Negative for abdominal pain and blood in stool.  Genitourinary: Negative for hematuria.  Musculoskeletal: Positive for back pain.       Chronic shoulder and back pain  Neurological: Negative for dizziness, weakness and headaches.       Objective:   Physical Exam  Constitutional: She is oriented to person, place, and time. She appears well-developed and well-nourished.  HENT:  Head: Normocephalic and atraumatic.  Right Ear: External ear normal.  Left Ear: External ear normal.  Eyes: EOM are normal. Pupils are equal, round, and reactive to light.  Neck: Normal range of motion. Neck supple. No thyromegaly present.  Cardiovascular: Regular rhythm and normal heart sounds.   Pulmonary/Chest: Effort normal and breath sounds normal. No respiratory distress.  Abdominal: Soft. Bowel sounds are normal. She exhibits no distension. There is no tenderness.  Musculoskeletal: She exhibits tenderness.  Tenderness to bilateral shoulders with palpation and movement  Lymphadenopathy:    She has no cervical adenopathy.  Neurological: She is alert and oriented to person, place, and time.  Skin: Skin is warm and dry.  Psychiatric: She has a normal mood and affect.          Assessment & Plan:  1. Hypertension - CMP14+EGFR;  Future - olmesartan (BENICAR) 20 MG tablet; Take 1 tablet (20 mg total) by mouth daily.  Dispense: 30 tablet; Refill: 3  2. Follow-up exam, 3-6 months since previous exam - POCT CBC; Future  3. Family history of diabetes mellitus - POCT glycosylated hemoglobin (Hb A1C); Future  4. History of hyperlipidemia - NMR, lipoprofile; Future  5. Chronic shoulder pain, unspecified laterality and 6. Chronic neck pain - Ambulatory referral to Orthopedic Surgery -Continue all current medications Labs pending, cmp F/u in 3 months Discussed exercise and diet  Patient verbalized understanding Erby Pian, [name]-C

## 2013-01-07 ENCOUNTER — Other Ambulatory Visit (INDEPENDENT_AMBULATORY_CARE_PROVIDER_SITE_OTHER): Payer: Medicare Other

## 2013-01-07 DIAGNOSIS — Z09 Encounter for follow-up examination after completed treatment for conditions other than malignant neoplasm: Secondary | ICD-10-CM

## 2013-01-07 DIAGNOSIS — Z8639 Personal history of other endocrine, nutritional and metabolic disease: Secondary | ICD-10-CM

## 2013-01-07 DIAGNOSIS — I10 Essential (primary) hypertension: Secondary | ICD-10-CM

## 2013-01-07 DIAGNOSIS — Z833 Family history of diabetes mellitus: Secondary | ICD-10-CM

## 2013-01-07 LAB — POCT CBC
Granulocyte percent: 48.6 %G (ref 37–80)
Hemoglobin: 11.6 g/dL — AB (ref 12.2–16.2)
MCV: 73.7 fL — AB (ref 80–97)
MPV: 8.3 fL (ref 0–99.8)
POC Granulocyte: 3.2 (ref 2–6.9)
POC LYMPH PERCENT: 47.7 %L (ref 10–50)
RBC: 5 M/uL (ref 4.04–5.48)
WBC: 6.5 10*3/uL (ref 4.6–10.2)

## 2013-01-07 LAB — POCT GLYCOSYLATED HEMOGLOBIN (HGB A1C): Hemoglobin A1C: 5.8

## 2013-01-07 NOTE — Progress Notes (Signed)
Pt came in for labs only 

## 2013-01-09 LAB — CMP14+EGFR
AST: 22 IU/L (ref 0–40)
Albumin/Globulin Ratio: 1.5 (ref 1.1–2.5)
Albumin: 4.4 g/dL (ref 3.5–4.7)
Alkaline Phosphatase: 70 IU/L (ref 39–117)
BUN: 24 mg/dL (ref 8–27)
CO2: 31 mmol/L — ABNORMAL HIGH (ref 18–29)
Calcium: 9.8 mg/dL (ref 8.6–10.2)
Creatinine, Ser: 1.28 mg/dL — ABNORMAL HIGH (ref 0.57–1.00)
GFR calc non Af Amer: 38 mL/min/{1.73_m2} — ABNORMAL LOW (ref >59)
Globulin, Total: 3 g/dL (ref 1.5–4.5)
Sodium: 143 mmol/L (ref 134–144)
Total Protein: 7.4 g/dL (ref 6.0–8.5)

## 2013-01-09 LAB — NMR, LIPOPROFILE
HDL Cholesterol by NMR: 45 mg/dL (ref >=40)
HDL Particle Number: 33.9 umol/L (ref >=30.5)
LDL Particle Number: 1446 nmol/L — ABNORMAL HIGH (ref <1000)
LDLC SERPL CALC-MCNC: 88 mg/dL (ref <100)
Small LDL Particle Number: 1314 nmol/L — ABNORMAL HIGH (ref <=527)
Triglycerides by NMR: 203 mg/dL — ABNORMAL HIGH (ref <150)

## 2013-01-15 ENCOUNTER — Encounter: Payer: Medicare Other | Admitting: Family Medicine

## 2013-01-21 ENCOUNTER — Other Ambulatory Visit: Payer: Self-pay | Admitting: General Practice

## 2013-01-21 DIAGNOSIS — E785 Hyperlipidemia, unspecified: Secondary | ICD-10-CM

## 2013-01-21 MED ORDER — COLESEVELAM HCL 625 MG PO TABS: 1875.0000 mg | ORAL_TABLET | Freq: Two times a day (BID) | ORAL | Status: DC

## 2013-02-02 ENCOUNTER — Other Ambulatory Visit: Payer: Self-pay | Admitting: Nurse Practitioner

## 2013-02-09 ENCOUNTER — Ambulatory Visit (INDEPENDENT_AMBULATORY_CARE_PROVIDER_SITE_OTHER): Payer: Medicare Other

## 2013-02-09 ENCOUNTER — Encounter: Payer: Self-pay | Admitting: Family Medicine

## 2013-02-09 ENCOUNTER — Ambulatory Visit (INDEPENDENT_AMBULATORY_CARE_PROVIDER_SITE_OTHER): Payer: Medicare Other | Admitting: Family Medicine

## 2013-02-09 VITALS — BP 177/90 | HR 81 | Temp 98.0°F | Wt 269.2 lb

## 2013-02-09 DIAGNOSIS — R0602 Shortness of breath: Secondary | ICD-10-CM

## 2013-02-09 DIAGNOSIS — J209 Acute bronchitis, unspecified: Secondary | ICD-10-CM

## 2013-02-09 DIAGNOSIS — G5600 Carpal tunnel syndrome, unspecified upper limb: Secondary | ICD-10-CM

## 2013-02-09 MED ORDER — LEVOFLOXACIN 250 MG PO TABS: 250.0000 mg | ORAL_TABLET | Freq: Every day | ORAL | Status: DC

## 2013-02-09 MED ORDER — BENZONATATE 200 MG PO CAPS: 200.0000 mg | ORAL_CAPSULE | Freq: Two times a day (BID) | ORAL | Status: DC | PRN

## 2013-02-09 NOTE — Progress Notes (Signed)
  Subjective:    Patient ID: [name], female    DOB: [birth year], 79 y.o.   MRN: [id number]  HPI This 79 y.o. female presents for evaluation of cough and congestion. She is having some chest wall pain.  She is coughing a lot and she Is having productive cough.  She is having some left wrist pain.   Review of Systems C/o cough and uri sx's, c/o left wrist discomfort No chest pain, SOB, HA, dizziness, vision change, N/V, diarrhea, constipation, dysuria, urinary urgency or frequency, myalgias, arthralgias or rash.     Objective:   Physical Exam Vital signs noted  Well developed well nourished female.  HEENT - Head atraumatic Normocephalic                Eyes - PERRLA, Conjuctiva - clear Sclera- Clear EOMI                Ears - EAC's Wnl TM's Wnl Gross Hearing WNL                Nose - Nares patent                 Throat - oropharanx wnl Respiratory - Lungs CTA bilateral Cardiac - RRR S1 and S2 without murmur GI - Abdomen soft Nontender and bowel sounds active x 4 Extremities - No edema. Neuro - Grossly intact. 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

## 2013-02-09 NOTE — Patient Instructions (Signed)

## 2013-02-10 ENCOUNTER — Other Ambulatory Visit: Payer: Self-pay | Admitting: Family Medicine

## 2013-03-26 ENCOUNTER — Ambulatory Visit: Payer: Medicare Other | Admitting: Neurology

## 2013-03-27 ENCOUNTER — Encounter: Payer: Self-pay | Admitting: Neurology

## 2013-03-27 ENCOUNTER — Ambulatory Visit (INDEPENDENT_AMBULATORY_CARE_PROVIDER_SITE_OTHER): Payer: Medicare Other | Admitting: Neurology

## 2013-03-27 ENCOUNTER — Encounter (INDEPENDENT_AMBULATORY_CARE_PROVIDER_SITE_OTHER): Payer: Self-pay

## 2013-03-27 VITALS — BP 164/93 | HR 83 | Wt 271.0 lb

## 2013-03-27 DIAGNOSIS — R569 Unspecified convulsions: Secondary | ICD-10-CM

## 2013-03-27 DIAGNOSIS — Z5181 Encounter for therapeutic drug level monitoring: Secondary | ICD-10-CM

## 2013-03-27 MED ORDER — PHENOBARBITAL 97.2 MG PO TABS: 97.2000 mg | ORAL_TABLET | Freq: Every day | ORAL | Status: DC

## 2013-03-27 MED ORDER — LEVETIRACETAM 1000 MG PO TABS: 1000.0000 mg | ORAL_TABLET | Freq: Two times a day (BID) | ORAL | Status: DC

## 2013-03-27 NOTE — Progress Notes (Signed)
Reason for visit: Seizures  [name] is an 79 y.o. female  History of present illness:  [name] is an 79 year old right-handed black female with a history of partial complex type seizures. The patient has been on phenobarbital for number of years, and she is also on Keppra taking 1000 mg twice daily. The patient has had one or 2 seizures over the last one year. The patient does not operate a motor vehicle. The seizure episodes are associated with confusion lasting several minutes, without loss of motor tone or generalized convulsions. The patient does not fall that she is standing during the seizure. The patient is tolerating her medications fairly well. The patient returns to this office for an evaluation. The patient has a lot of problems with neck and low back discomfort, and arthritis pain in the shoulders. The patient has not been seen through this office since 2012.  Past Medical History  Diagnosis Date  . Hypertension   . Hiatal hernia   . Stroke   . Edema   . Neurogenic claudication   . Seizures   . Internal hemorrhoids   . Eczema   . Obesity   . Seizure disorder   . Headache   . Gout   . Degenerative arthritis   . Chronic low back pain     Past Surgical History  Procedure Laterality Date  . Bilateral knees    . Appendectomy    . Mammoplasty reduction    . Neuroplasty / transposition median nerve at carpal tunnel bilateral    . Tubal ligation    . Cataracts    . Cholecystectomy    . Abdominal hysterectomy    . Appendectomy    . Re-excision of breast lumpectomy      Family History  Problem Relation Age of Onset  . Diabetes Mother   . Heart disease Mother   . Heart disease Father   . Cancer Sister   . Cancer Brother   . Cancer Sister     Social history:  reports that she has never smoked. She does not have any smokeless tobacco history on file. Her alcohol and drug histories are not on file.    Allergies  Allergen Reactions  . Advicor  [Niacin-Lovastatin Er]   . Caduet [Amlodipine-Atorvastatin]   . Codeine   . Dilantin [Phenytoin]   . Lescol [Fluvastatin]   . Pamelor [Nortriptyline]   . Penicillins   . Sulfa Antibiotics   . Trileptal [Oxcarbazepine]   . Ultram [Tramadol]   . Welchol [Colesevelam]     Medications:  Current Outpatient Prescriptions on File Prior to Visit  Medication Sig Dispense Refill  . ALREX 0.2 % SUSP       . aspirin 81 MG tablet Take 81 mg by mouth daily.      . benzonatate (TESSALON) 200 MG capsule Take 1 capsule (200 mg total) by mouth 2 (two) times daily as needed for cough.  20 capsule  0  . colesevelam (WELCHOL) 625 MG tablet Take 3 tablets (1,875 mg total) by mouth 2 (two) times daily with a meal.  180 tablet  3  . ferrous sulfate 325 (65 FE) MG tablet Take 325 mg by mouth daily with breakfast.      . furosemide (LASIX) 80 MG tablet TAKE 1 TABLET IN THE MORNING AND 1 TABLET AT NOON DAILY  60 tablet  15  . Multiple Vitamins-Minerals (MULTIVITAMIN WITH MINERALS) tablet Take 1 tablet by mouth daily.      Marland Kitchen  olmesartan (BENICAR) 20 MG tablet Take 1 tablet (20 mg total) by mouth daily.  30 tablet  3  . ULORIC 80 MG TABS TAKE 1 TABLET EVERY DAY  90 tablet  1   No current facility-administered medications on file prior to visit.    ROS:  Out of a complete 14 system review of symptoms, the patient complains only of the following symptoms, and all other reviewed systems are negative.  Neck stiffness, ear pain, ringing in the ears, runny nose Eye itching, eye redness Cough, wheezing, shortness of breath Cold intolerance, excessive eating Daytime sleepiness Food allergies Joint pain, joint swelling, achy muscles Memory loss, dizziness, numbness, weakness  Blood pressure 164/93, pulse 83, weight 271 lb (122.925 kg).  Physical Exam  General: The patient is alert and cooperative at the time of the examination. The patient is markedly obese.  Neuromuscular: The patient has limited abduction  of the shoulders bilaterally, is able to abduct the arms 60 or 70 bilaterally.  Skin: 2+ edema below the knees is seen bilaterally.   Neurologic Exam  Mental status: The patient is oriented x 3.  Cranial nerves: Facial symmetry is present. Speech is normal, no aphasia or dysarthria is noted. Extraocular movements are full. Visual fields are full.  Motor: The patient has good strength in all 4 extremities.  Sensory examination: Soft touch sensation on the face, arms, and legs is symmetric.  Coordination: The patient has good finger-nose-finger and heel-to-shin bilaterally. Some apraxia with the use of the lower extremities is seen.  Gait and station: The patient has a wide-based, slightly unsteady gait. The patient walks with a cane. The patient stooped with walking. Tandem gait was not attempted. Romberg is negative. No drift is seen.  Reflexes: Deep tendon reflexes are symmetric, but are depressed.   Assessment/Plan:  1. History of partial complex seizures  2. Obesity  3. Degenerative arthritis  The patient is continued to have an occasional seizure event. The seizures or nonconvulsive, and the patient does not drive, and the seizures have a little impact on her ability to function day to day. The patient will be maintained on her current dosing regimen, and she will have blood work done today. The patient will followup through this office in one year.  Jill Alexanders MD 03/28/2013 11:39 AM  [facility] [address] [place], [place] [address]  Phone [phone] Fax [phone]

## 2013-03-27 NOTE — Patient Instructions (Signed)
Seizure, Adult A seizure is abnormal electrical activity in the brain. Seizures can cause a change in attention or behavior (altered mental status). Seizures often involve uncontrollable shaking (convulsions). Seizures usually last from 30 seconds to 2 minutes. Epilepsy is a brain disorder in which a patient has repeated seizures over time. CAUSES  There are many different problems that can cause seizures. In some cases, no cause is found. Common causes of seizures include:  Head injuries.  Brain tumors.  Infections.  Imbalance of chemicals in the blood.  Kidney failure or liver failure.  Heart disease.  Drug abuse.  Stroke.  Withdrawal from certain drugs or alcohol.  Birth defects.  Malfunction of a neurosurgical device placed in the brain. SYMPTOMS  Symptoms vary depending on the part of the brain that is involved. Right before a seizure, you may have a warning (aura) that a seizure is about to occur. An aura may include the following symptoms:   Fear or anxiety.  Nausea.  Feeling like the room is spinning (vertigo).  Vision changes, such as seeing flashing lights or spots. Common symptoms during a seizure include:  Convulsions.  Drooling.  Rapid eye movements.  Grunting.  Loss of bladder and bowel control.  Bitter taste in the mouth. After a seizure, you may feel confused and sleepy. You may also have an injury resulting from convulsions during the seizure. DIAGNOSIS  Your caregiver will perform a physical exam and run some tests to determine the type and cause of your seizure. These tests may include:  Blood tests.  A lumbar puncture test. In this test, a small amount of fluid is removed from the spine and examined.  Electrocardiography (ECG). This test records the electrical activity in your heart.  Imaging tests, such as computed tomography (CT) scans or magnetic resonance imaging (MRI).  Electroencephalography (EEG). This test records the electrical  activity in your brain. TREATMENT  Seizures usually stop on their own. Treatment will depend on the cause of your seizure. In some cases, medicine may be given to prevent future seizures. HOME CARE INSTRUCTIONS   If you are given medicines, take them exactly as prescribed by your caregiver.  Keep all follow-up appointments as directed by your caregiver.  Do not swim or drive until your caregiver says it is okay.  Teach friends and family what to do if you have a seizure. They should:  Lay you on the ground to prevent a fall.  Put a cushion under your head.  Loosen any tight clothing around your neck.  Turn you on your side. If vomiting occurs, this helps keep your airway clear.  Stay with you until you recover. SEEK IMMEDIATE MEDICAL CARE IF:  The seizure lasts longer than 2 to 5 minutes.  The seizure is severe or the person does not wake up after the seizure.  The person has altered mental status. Drive the person to the emergency department or call your local emergency services (911 in [place] MAKE SURE YOU:  Understand these instructions.  Will watch your condition.  Will get help right away if you are not doing well or get worse. Document Released: 03/30/2000 Document Revised: 06/25/2011 Document Reviewed: 03/21/2011 [employer] Patient Information 2014 [employer].  

## 2013-04-24 ENCOUNTER — Ambulatory Visit: Payer: Medicare Other | Admitting: Neurology

## 2013-07-15 ENCOUNTER — Other Ambulatory Visit: Payer: Self-pay | Admitting: Neurology

## 2013-07-15 ENCOUNTER — Other Ambulatory Visit: Payer: Medicare Other

## 2013-07-15 ENCOUNTER — Ambulatory Visit (INDEPENDENT_AMBULATORY_CARE_PROVIDER_SITE_OTHER): Payer: Medicare Other | Admitting: Family Medicine

## 2013-07-15 ENCOUNTER — Encounter: Payer: Self-pay | Admitting: Family Medicine

## 2013-07-15 VITALS — BP 182/102 | HR 100 | Temp 97.3°F | Ht 65.0 in | Wt 271.0 lb

## 2013-07-15 DIAGNOSIS — B029 Zoster without complications: Secondary | ICD-10-CM

## 2013-07-15 DIAGNOSIS — M792 Neuralgia and neuritis, unspecified: Secondary | ICD-10-CM

## 2013-07-15 DIAGNOSIS — R21 Rash and other nonspecific skin eruption: Secondary | ICD-10-CM

## 2013-07-15 DIAGNOSIS — IMO0002 Reserved for concepts with insufficient information to code with codable children: Secondary | ICD-10-CM

## 2013-07-15 MED ORDER — KETOCONAZOLE 2 % EX CREA: 1.0000 "application " | TOPICAL_CREAM | Freq: Two times a day (BID) | CUTANEOUS | Status: DC | PRN

## 2013-07-15 MED ORDER — ACYCLOVIR 800 MG PO TABS: 800.0000 mg | ORAL_TABLET | Freq: Every day | ORAL | Status: DC

## 2013-07-15 NOTE — Progress Notes (Signed)
   Subjective:    Patient ID: [name], female    DOB: July 01, 1923, 79 y.o.   MRN: [id number]  HPI This 80 y.o. female presents for evaluation of numbness and tingling throughout her body. She has numbness and tingling in her upper extremities and lower extremities.  She has hx Of shingles.  She has hx of arthritis.   Review of Systems C/o numbness and tingling No chest pain, SOB, HA, dizziness, vision change, N/V, diarrhea, constipation, dysuria, urinary urgency or frequency, myalgias, arthralgias or rash.     Objective:   Physical Exam  Vital signs noted  Well developed well nourished female.  HEENT - Head atraumatic Normocephalic                Eyes - PERRLA, Conjuctiva - clear Sclera- Clear EOMI                Ears - EAC's Wnl TM's Wnl Gross Hearing WNL                 Throat - oropharanx wnl Respiratory - Lungs CTA bilateral Cardiac - RRR S1 and S2 without murmur GI - Abdomen soft Nontender and bowel sounds active x 4 Extremities - No edema. Neuro - Grossly intact. Skin - Bilateral breasts with rash and no vesicular rash noted     Assessment & Plan:  Rash and nonspecific skin eruption - Plan: ketoconazole (NIZORAL) 2 % cream  Shingles - Plan: acyclovir (ZOVIRAX) 800 MG tablet, CANCELED: Varicella-zoster vaccine subcutaneous  Neuralgia - Plan: POCT CBC, CMP14+EGFR, Vitamin B12  HTN - Take Bp meds and follow up in 2 weeks for re-eval  Lysbeth Penner [name]

## 2013-07-16 ENCOUNTER — Other Ambulatory Visit: Payer: Medicare Other

## 2013-07-16 LAB — POCT CBC
Granulocyte percent: 58.5 %G (ref 37–80)
HCT, POC: 34.8 % — AB (ref 37.7–47.9)
Hemoglobin: 10.8 g/dL — AB (ref 12.2–16.2)
Lymph, poc: 3 (ref 0.6–3.4)
MCH, POC: 23.5 pg — AB (ref 27–31.2)
MCHC: 31 g/dL — AB (ref 31.8–35.4)
MCV: 75.6 fL — AB (ref 80–97)
MPV: 8.3 fL (ref 0–99.8)
POC Granulocyte: 4.4 (ref 2–6.9)
POC LYMPH PERCENT: 39 %L (ref 10–50)
Platelet Count, POC: 158 10*3/uL (ref 142–424)
RBC: 4.6 M/uL (ref 4.04–5.48)
RDW, POC: 15.1 %
WBC: 7.6 10*3/uL (ref 4.6–10.2)

## 2013-07-17 LAB — CMP14+EGFR
ALT: 12 IU/L (ref 0–32)
AST: 20 IU/L (ref 0–40)
Albumin/Globulin Ratio: 1.5 (ref 1.1–2.5)
Albumin: 4.2 g/dL (ref 3.5–4.7)
Alkaline Phosphatase: 68 IU/L (ref 39–117)
BUN/Creatinine Ratio: 16 (ref 11–26)
BUN: 20 mg/dL (ref 8–27)
CO2: 28 mmol/L (ref 18–29)
Calcium: 9.8 mg/dL (ref 8.7–10.3)
Chloride: 98 mmol/L (ref 97–108)
Creatinine, Ser: 1.23 mg/dL — ABNORMAL HIGH (ref 0.57–1.00)
GFR calc Af Amer: 45 mL/min/{1.73_m2} — ABNORMAL LOW (ref >59)
GFR calc non Af Amer: 39 mL/min/{1.73_m2} — ABNORMAL LOW (ref >59)
Globulin, Total: 2.8 g/dL (ref 1.5–4.5)
Glucose: 97 mg/dL (ref 65–99)
Potassium: 3.9 mmol/L (ref 3.5–5.2)
Sodium: 141 mmol/L (ref 134–144)
Total Bilirubin: 0.4 mg/dL (ref 0.0–1.2)
Total Protein: 7 g/dL (ref 6.0–8.5)

## 2013-07-17 LAB — VITAMIN B12: Vitamin B-12: 813 pg/mL (ref 211–946)

## 2013-08-03 ENCOUNTER — Other Ambulatory Visit: Payer: Self-pay | Admitting: Family Medicine

## 2013-08-05 ENCOUNTER — Ambulatory Visit (INDEPENDENT_AMBULATORY_CARE_PROVIDER_SITE_OTHER): Payer: Medicare Other | Admitting: Family Medicine

## 2013-08-05 ENCOUNTER — Ambulatory Visit (INDEPENDENT_AMBULATORY_CARE_PROVIDER_SITE_OTHER): Payer: Medicare Other

## 2013-08-05 VITALS — BP 131/82 | HR 72 | Temp 97.7°F | Ht 65.0 in | Wt 265.0 lb

## 2013-08-05 DIAGNOSIS — Z833 Family history of diabetes mellitus: Secondary | ICD-10-CM

## 2013-08-05 DIAGNOSIS — M25512 Pain in left shoulder: Secondary | ICD-10-CM

## 2013-08-05 DIAGNOSIS — M542 Cervicalgia: Secondary | ICD-10-CM

## 2013-08-05 DIAGNOSIS — M25519 Pain in unspecified shoulder: Secondary | ICD-10-CM

## 2013-08-05 DIAGNOSIS — G8929 Other chronic pain: Secondary | ICD-10-CM

## 2013-08-05 LAB — POCT CBC
Granulocyte percent: 50.5 %G (ref 37–80)
HCT, POC: 34.3 % — AB (ref 37.7–47.9)
Hemoglobin: 10.7 g/dL — AB (ref 12.2–16.2)
LYMPH, POC: 3.2 (ref 0.6–3.4)
MCH: 23.6 pg — AB (ref 27–31.2)
MCHC: 31.3 g/dL — AB (ref 31.8–35.4)
MCV: 75.6 fL — AB (ref 80–97)
MPV: 9 fL (ref 0–99.8)
PLATELET COUNT, POC: 147 10*3/uL (ref 142–424)
POC Granulocyte: 3.6 (ref 2–6.9)
POC LYMPH %: 45.5 % (ref 10–50)
RBC: 4.5 M/uL (ref 4.04–5.48)
RDW, POC: 15.1 %
WBC: 7.1 10*3/uL (ref 4.6–10.2)

## 2013-08-05 MED ORDER — PREDNISONE 10 MG PO TABS: ORAL_TABLET | ORAL | Status: DC

## 2013-08-05 NOTE — Progress Notes (Signed)
Subjective:    Patient ID: [name], female    DOB: 01-25-1924, 80 y.o.   MRN: [id number]  HPI Patient here today for left side neck and arm pain that started 2 days ago.      Patient Active Problem List   Diagnosis Date Noted  . Hypertension 08/21/2012  . Hiatal hernia 08/21/2012  . Seizures 08/21/2012   Outpatient Encounter Prescriptions as of 08/05/2013  Medication Sig  . acyclovir (ZOVIRAX) 800 MG tablet Take 1 tablet (800 mg total) by mouth 5 (five) times daily.  . ALREX 0.2 % SUSP   . aspirin 81 MG tablet Take 81 mg by mouth daily.  . ferrous sulfate 325 (65 FE) MG tablet Take 325 mg by mouth daily with breakfast.  . furosemide (LASIX) 80 MG tablet TAKE 1 TABLET IN THE MORNING AND 1 TABLET AT NOON DAILY  . ketoconazole (NIZORAL) 2 % cream Apply 1 application topically 2 (two) times daily as needed for irritation.  . levETIRAcetam (KEPPRA) 1000 MG tablet Take 1 tablet (1,000 mg total) by mouth 2 (two) times daily.  . Multiple Vitamins-Minerals (MULTIVITAMIN WITH MINERALS) tablet Take 1 tablet by mouth daily.  Marland Kitchen olmesartan (BENICAR) 20 MG tablet Take 1 tablet (20 mg total) by mouth daily.  Marland Kitchen PHENobarbital (LUMINAL) 97.2 MG tablet Take 1 tablet (97.2 mg total) by mouth daily.  Marland Kitchen ULORIC 80 MG TABS TAKE 1 TABLET EVERY DAY    Review of Systems  Constitutional: Negative.   HENT: Negative.   Eyes: Negative.   Respiratory: Negative.   Cardiovascular: Negative.   Gastrointestinal: Negative.   Endocrine: Negative.   Genitourinary: Negative.   Musculoskeletal: Positive for arthralgias (left arm and shoulder) and neck pain (left side).  Skin: Negative.   Allergic/Immunologic: Negative.   Neurological: Negative.   Hematological: Negative.   Psychiatric/Behavioral: Negative.        Objective:   Physical Exam  Nursing note and vitals reviewed. Constitutional: She is oriented to person, place, and time. She appears well-developed and well-nourished. No distress.  She  appears younger than her stated age of 18 years she is alert and able to relay information about her current condition of left shoulder and neck pain  HENT:  Head: Normocephalic and atraumatic.  Eyes: Conjunctivae and EOM are normal. Pupils are equal, round, and reactive to light. Right eye exhibits no discharge. Left eye exhibits no discharge. No scleral icterus.  Neck: Normal range of motion. Neck supple. No thyromegaly present.  Cardiovascular: Normal rate, regular rhythm and normal heart sounds.  Exam reveals no gallop and no friction rub.   No murmur heard. Pulmonary/Chest: Effort normal and breath sounds normal. No respiratory distress. She has no wheezes. She has no rales. She exhibits no tenderness.  Breath sounds are clear anteriorly and posteriorly  Abdominal:  Obesity  Musculoskeletal: She exhibits tenderness (left shoulder). She exhibits no edema.  Unable to abduct left arm without using the other arm and hand to raise it  Lymphadenopathy:    She has no cervical adenopathy.  Neurological: She is alert and oriented to person, place, and time. No cranial nerve deficit.  Skin: Skin is warm and dry. No rash noted.  Psychiatric: She has a normal mood and affect. Her behavior is normal. Judgment and thought content normal.   BP 131/82  Pulse 72  Temp(Src) 97.7 F (36.5 C) (Oral)  Ht _0  (1.651 m)  Wt 265 lb (120.203 kg)  BMI 44.10 kg/m2  EKG:  normal  sinus rhythm","unchanged from previous tracings  [facility] reading (PRIMARY) by  Dr.Moore-C-spine, left shoulder  --- degenerative changes in both cervical spine and left shoulder                                       Assessment & Plan:  1. Neck pain on left side - POCT CBC - CMP14+EGFR - predniSONE (DELTASONE) 10 MG tablet; 1 tablet 4 times a day for 2 days,  1 tablet 3 times a day for 2 days,  1 tablet 2 times a day for 2 days, 1 tablet daily for 2 days  Dispense: 20 tablet; Refill: 0 - DG Shoulder Left; Future - DG  Cervical Spine Complete; Future  2. Left shoulder pain - predniSONE (DELTASONE) 10 MG tablet; 1 tablet 4 times a day for 2 days,  1 tablet 3 times a day for 2 days,  1 tablet 2 times a day for 2 days, 1 tablet daily for 2 days  Dispense: 20 tablet; Refill: 0 - DG Shoulder Left; Future  3. Family history of diabetes mellitus  4. Chronic neck pain Patient Instructions  Take medication as directed Use warm wet compresses 20 minutes 4 times daily to the left shoulder and neck We will call you with the results of the x-rays once those results are available of the neck and shoulder Take Tylenol if needed for pain   Arrie Senate MD

## 2013-08-05 NOTE — Patient Instructions (Signed)
Take medication as directed Use warm wet compresses 20 minutes 4 times daily to the left shoulder and neck We will call you with the results of the x-rays once those results are available of the neck and shoulder Take Tylenol if needed for pain

## 2013-08-06 ENCOUNTER — Telehealth: Payer: Self-pay

## 2013-08-06 LAB — CMP14+EGFR
ALK PHOS: 62 IU/L (ref 39–117)
ALT: 15 IU/L (ref 0–32)
AST: 20 IU/L (ref 0–40)
Albumin/Globulin Ratio: 1.4 (ref 1.1–2.5)
Albumin: 4.2 g/dL (ref 3.5–4.7)
BUN / CREAT RATIO: 19 (ref 11–26)
BUN: 25 mg/dL (ref 8–27)
CALCIUM: 9.3 mg/dL (ref 8.7–10.3)
CO2: 28 mmol/L (ref 18–29)
Chloride: 98 mmol/L (ref 97–108)
Creatinine, Ser: 1.31 mg/dL — ABNORMAL HIGH (ref 0.57–1.00)
GFR calc Af Amer: 42 mL/min/{1.73_m2} — ABNORMAL LOW (ref >59)
GFR calc non Af Amer: 36 mL/min/{1.73_m2} — ABNORMAL LOW (ref >59)
GLOBULIN, TOTAL: 2.9 g/dL (ref 1.5–4.5)
Glucose: 102 mg/dL — ABNORMAL HIGH (ref 65–99)
POTASSIUM: 4.4 mmol/L (ref 3.5–5.2)
SODIUM: 141 mmol/L (ref 134–144)
Total Bilirubin: <0.2 mg/dL (ref 0.0–1.2)
Total Protein: 7.1 g/dL (ref 6.0–8.5)

## 2013-08-06 NOTE — Telephone Encounter (Signed)
Message copied by Koren Bound on Thu Aug 06, 2013  9:15 AM ------      Message from: Chipper Herb      Created: Wed Aug 05, 2013  5:48 PM       As per radiology report--- there are a lot of degenerative changes as mentioned to the patient earlier in the shoulder joint as well as the acromioclavicular joint. If the prednisone has not helped her she needs to have an appointment with the orthopedic surgeon when he is here at the next visit----he make can inject her shoulder and give her more relief ------

## 2013-08-06 NOTE — Telephone Encounter (Signed)
Pt aware of shoulder x-ray results. Will call  us if she needs to be seen by ortho

## 2013-08-11 NOTE — Addendum Note (Signed)
Addended by: Ilean China on: 08/11/2013 10:04 AM   Modules accepted: Orders

## 2013-08-27 ENCOUNTER — Other Ambulatory Visit: Payer: Self-pay | Admitting: Orthopedic Surgery

## 2013-08-31 ENCOUNTER — Ambulatory Visit: Payer: Medicare Other | Attending: Orthopedic Surgery | Admitting: Physical Therapy

## 2013-08-31 DIAGNOSIS — I10 Essential (primary) hypertension: Secondary | ICD-10-CM | POA: Insufficient documentation

## 2013-08-31 DIAGNOSIS — M25659 Stiffness of unspecified hip, not elsewhere classified: Secondary | ICD-10-CM | POA: Diagnosis not present

## 2013-08-31 DIAGNOSIS — M5030 Other cervical disc degeneration, unspecified cervical region: Secondary | ICD-10-CM | POA: Insufficient documentation

## 2013-08-31 DIAGNOSIS — M25519 Pain in unspecified shoulder: Secondary | ICD-10-CM | POA: Insufficient documentation

## 2013-08-31 DIAGNOSIS — IMO0001 Reserved for inherently not codable concepts without codable children: Secondary | ICD-10-CM | POA: Diagnosis present

## 2013-08-31 DIAGNOSIS — R51 Headache: Secondary | ICD-10-CM | POA: Diagnosis not present

## 2013-08-31 DIAGNOSIS — R5381 Other malaise: Secondary | ICD-10-CM | POA: Insufficient documentation

## 2013-08-31 DIAGNOSIS — M542 Cervicalgia: Secondary | ICD-10-CM | POA: Diagnosis not present

## 2013-09-03 ENCOUNTER — Ambulatory Visit: Payer: Medicare Other | Admitting: *Deleted

## 2013-09-03 DIAGNOSIS — IMO0001 Reserved for inherently not codable concepts without codable children: Secondary | ICD-10-CM | POA: Diagnosis not present

## 2013-09-08 ENCOUNTER — Ambulatory Visit: Payer: Medicare Other | Admitting: Physical Therapy

## 2013-09-08 DIAGNOSIS — IMO0001 Reserved for inherently not codable concepts without codable children: Secondary | ICD-10-CM | POA: Diagnosis not present

## 2013-09-10 ENCOUNTER — Ambulatory Visit: Payer: Medicare Other | Admitting: Physical Therapy

## 2013-09-10 DIAGNOSIS — IMO0001 Reserved for inherently not codable concepts without codable children: Secondary | ICD-10-CM | POA: Diagnosis not present

## 2013-09-15 ENCOUNTER — Ambulatory Visit: Payer: Medicare Other | Attending: Orthopedic Surgery | Admitting: *Deleted

## 2013-09-15 DIAGNOSIS — R51 Headache: Secondary | ICD-10-CM | POA: Insufficient documentation

## 2013-09-15 DIAGNOSIS — M5030 Other cervical disc degeneration, unspecified cervical region: Secondary | ICD-10-CM | POA: Insufficient documentation

## 2013-09-15 DIAGNOSIS — M25659 Stiffness of unspecified hip, not elsewhere classified: Secondary | ICD-10-CM | POA: Insufficient documentation

## 2013-09-15 DIAGNOSIS — M25519 Pain in unspecified shoulder: Secondary | ICD-10-CM | POA: Diagnosis not present

## 2013-09-15 DIAGNOSIS — I10 Essential (primary) hypertension: Secondary | ICD-10-CM | POA: Diagnosis not present

## 2013-09-15 DIAGNOSIS — M542 Cervicalgia: Secondary | ICD-10-CM | POA: Insufficient documentation

## 2013-09-15 DIAGNOSIS — IMO0001 Reserved for inherently not codable concepts without codable children: Secondary | ICD-10-CM | POA: Insufficient documentation

## 2013-09-15 DIAGNOSIS — R5381 Other malaise: Secondary | ICD-10-CM | POA: Insufficient documentation

## 2013-09-17 ENCOUNTER — Ambulatory Visit: Payer: Medicare Other | Admitting: *Deleted

## 2013-09-17 DIAGNOSIS — IMO0001 Reserved for inherently not codable concepts without codable children: Secondary | ICD-10-CM | POA: Diagnosis not present

## 2013-09-22 ENCOUNTER — Ambulatory Visit: Payer: Medicare Other | Admitting: Physical Therapy

## 2013-09-22 DIAGNOSIS — IMO0001 Reserved for inherently not codable concepts without codable children: Secondary | ICD-10-CM | POA: Diagnosis not present

## 2013-09-24 ENCOUNTER — Ambulatory Visit: Payer: Medicare Other | Admitting: Physical Therapy

## 2013-09-24 DIAGNOSIS — IMO0001 Reserved for inherently not codable concepts without codable children: Secondary | ICD-10-CM | POA: Diagnosis not present

## 2013-10-15 LAB — CBC WITH DIFFERENTIAL
BASOS ABS: 0.1 10*3/uL (ref 0.0–0.2)
Basos: 1 %
EOS: 4 %
Eosinophils Absolute: 0.3 10*3/uL (ref 0.0–0.4)
HCT: 34.5 % (ref 34.0–46.6)
Hemoglobin: 11.3 g/dL (ref 11.1–15.9)
IMMATURE GRANULOCYTES: 0 %
Immature Grans (Abs): 0 10*3/uL (ref 0.0–0.1)
Lymphocytes Absolute: 3.5 10*3/uL — ABNORMAL HIGH (ref 0.7–3.1)
Lymphs: 48 %
MCH: 23.9 pg — ABNORMAL LOW (ref 26.6–33.0)
MCHC: 32.8 g/dL (ref 31.5–35.7)
MCV: 73 fL — ABNORMAL LOW (ref 79–97)
MONOCYTES: 6 %
Monocytes Absolute: 0.4 10*3/uL (ref 0.1–0.9)
Neutrophils Absolute: 2.9 10*3/uL (ref 1.4–7.0)
Neutrophils Relative %: 41 %
PLATELETS: 169 10*3/uL (ref 150–379)
RBC: 4.73 x10E6/uL (ref 3.77–5.28)
RDW: 16.3 % — AB (ref 12.3–15.4)
WBC: 7.2 10*3/uL (ref 3.4–10.8)

## 2013-10-15 LAB — COMPREHENSIVE METABOLIC PANEL
ALBUMIN: 4.4 g/dL (ref 3.5–4.7)
ALT: 15 IU/L (ref 0–32)
AST: 23 IU/L (ref 0–40)
Albumin/Globulin Ratio: 1.7 (ref 1.1–2.5)
Alkaline Phosphatase: 67 IU/L (ref 39–117)
BUN/Creatinine Ratio: 19 (ref 11–26)
BUN: 23 mg/dL (ref 8–27)
CHLORIDE: 98 mmol/L (ref 97–108)
CO2: 30 mmol/L — AB (ref 18–29)
Calcium: 9.6 mg/dL (ref 8.7–10.3)
Creatinine, Ser: 1.23 mg/dL — ABNORMAL HIGH (ref 0.57–1.00)
GFR calc Af Amer: 45 mL/min/{1.73_m2} — ABNORMAL LOW (ref >59)
GFR calc non Af Amer: 39 mL/min/{1.73_m2} — ABNORMAL LOW (ref >59)
Globulin, Total: 2.6 g/dL (ref 1.5–4.5)
Glucose: 101 mg/dL — ABNORMAL HIGH (ref 65–99)
Potassium: 3.9 mmol/L (ref 3.5–5.2)
Sodium: 143 mmol/L (ref 134–144)
TOTAL PROTEIN: 7 g/dL (ref 6.0–8.5)
Total Bilirubin: 0.3 mg/dL (ref 0.0–1.2)

## 2013-10-15 LAB — PHENOBARBITAL LEVEL: PHENOBARBITAL LVL: 16 ug/mL (ref 15–40)

## 2013-10-20 ENCOUNTER — Other Ambulatory Visit: Payer: Self-pay | Admitting: Neurology

## 2013-10-20 NOTE — Telephone Encounter (Signed)
Rx signed and faxed.

## 2013-11-04 ENCOUNTER — Other Ambulatory Visit: Payer: Self-pay | Admitting: Family Medicine

## 2013-12-08 ENCOUNTER — Other Ambulatory Visit: Payer: Self-pay | Admitting: Nurse Practitioner

## 2014-01-11 ENCOUNTER — Encounter: Payer: Self-pay | Admitting: Family Medicine

## 2014-01-11 ENCOUNTER — Ambulatory Visit (INDEPENDENT_AMBULATORY_CARE_PROVIDER_SITE_OTHER): Payer: Medicare Other | Admitting: Family Medicine

## 2014-01-11 VITALS — BP 150/84 | HR 100 | Temp 97.6°F

## 2014-01-11 DIAGNOSIS — E559 Vitamin D deficiency, unspecified: Secondary | ICD-10-CM

## 2014-01-11 DIAGNOSIS — R569 Unspecified convulsions: Secondary | ICD-10-CM

## 2014-01-11 DIAGNOSIS — J209 Acute bronchitis, unspecified: Secondary | ICD-10-CM

## 2014-01-11 DIAGNOSIS — R5381 Other malaise: Secondary | ICD-10-CM

## 2014-01-11 DIAGNOSIS — M109 Gout, unspecified: Secondary | ICD-10-CM

## 2014-01-11 DIAGNOSIS — D509 Iron deficiency anemia, unspecified: Secondary | ICD-10-CM

## 2014-01-11 DIAGNOSIS — Z79899 Other long term (current) drug therapy: Secondary | ICD-10-CM

## 2014-01-11 DIAGNOSIS — E785 Hyperlipidemia, unspecified: Secondary | ICD-10-CM

## 2014-01-11 DIAGNOSIS — I10 Essential (primary) hypertension: Secondary | ICD-10-CM

## 2014-01-11 DIAGNOSIS — R5383 Other fatigue: Secondary | ICD-10-CM

## 2014-01-11 LAB — POCT CBC
Granulocyte percent: 59.5 %G (ref 37–80)
HCT, POC: 38.2 % (ref 37.7–47.9)
Hemoglobin: 11.5 g/dL — AB (ref 12.2–16.2)
Lymph, poc: 2.9 (ref 0.6–3.4)
MCH, POC: 22.8 pg — AB (ref 27–31.2)
MCHC: 30.1 g/dL — AB (ref 31.8–35.4)
MCV: 75.8 fL — AB (ref 80–97)
MPV: 8.9 fL (ref 0–99.8)
PLATELET COUNT, POC: 146 10*3/uL (ref 142–424)
POC Granulocyte: 4.7 (ref 2–6.9)
POC LYMPH %: 36.8 % (ref 10–50)
RBC: 5 M/uL (ref 4.04–5.48)
RDW, POC: 14.4 %
WBC: 7.9 10*3/uL (ref 4.6–10.2)

## 2014-01-11 MED ORDER — AZITHROMYCIN 250 MG PO TABS: ORAL_TABLET | ORAL | Status: DC

## 2014-01-11 NOTE — Patient Instructions (Addendum)
Continue current medications. Continue good therapeutic lifestyle changes which include good diet and exercise. Fall precautions discussed with patient. If an FOBT was given today- please return it to our front desk. If you are over 78 years old - you may need Prevnar 39 or the adult Pneumonia vaccine.  Flu Shots will be available at our office starting mid- September. Please call and schedule a FLU CLINIC APPOINTMENT.  Use saline nose spray frequently in the day Use  Mucinex maximum strength, blue and white in color, over-the-counter, one twice daily with a large glass of water to loosen your cough Drink plenty of fluids Watch sodium intake Monitor blood pressures at home and bring these readings back in 2 weeks for review We will call you with the results of your lab work is as those results are available Take Tylenol as needed for aches pains and fever

## 2014-01-11 NOTE — Progress Notes (Signed)
Subjective:    Patient ID: [name], female    DOB: 1924-03-19, 80 y.o.   MRN: [id number]  HPI Pt is here today for follow up of chronic medical problems which include hypertension and seizures. The patient is merely here for surgical clearance for cataract surgery. Today she complains of fatigue headache ear pain and coughing up purulent sputum. This has been going on for 2 weeks. She also complains of left shoulder pain. She comes to the visit today with her daughter. The patient indicates that her home blood pressures have been running in the 135/80 range.           Patient Active Problem List   Diagnosis Date Noted  . Hypertension 08/21/2012  . Hiatal hernia 08/21/2012  . Seizures 08/21/2012   Outpatient Encounter Prescriptions as of 01/11/2014  Medication Sig  . ALREX 0.2 % SUSP   . aspirin 81 MG tablet Take 81 mg by mouth daily.  Marland Kitchen BENICAR 20 MG tablet TAKE 1 TABLET (20 MG TOTAL) BY MOUTH DAILY.  . ferrous sulfate 325 (65 FE) MG tablet Take 325 mg by mouth daily with breakfast.  . furosemide (LASIX) 80 MG tablet TAKE 1 TABLET IN THE MORNING AND 1 TABLET AT NOON DAILY  . levETIRAcetam (KEPPRA) 1000 MG tablet Take 1 tablet (1,000 mg total) by mouth 2 (two) times daily.  . Multiple Vitamins-Minerals (MULTIVITAMIN WITH MINERALS) tablet Take 1 tablet by mouth daily.  Marland Kitchen PHENobarbital (LUMINAL) 97.2 MG tablet TAKE 1 TABLET BY MOUTH DAILY  . ULORIC 80 MG TABS TAKE 1 TABLET BY MOUTH EVERY DAY  . acyclovir (ZOVIRAX) 800 MG tablet Take 1 tablet (800 mg total) by mouth 5 (five) times daily.  . [DISCONTINUED] ketoconazole (NIZORAL) 2 % cream Apply 1 application topically 2 (two) times daily as needed for irritation.  . [DISCONTINUED] predniSONE (DELTASONE) 10 MG tablet 1 tablet 4 times a day for 2 days,  1 tablet 3 times a day for 2 days,  1 tablet 2 times a day for 2 days, 1 tablet daily for 2 days       Review of Systems  Constitutional: Positive for fatigue.  HENT:  Positive for ear pain (slight).   Eyes: Positive for visual disturbance (sees Dr Jeneen Rinks, cataracts).  Respiratory: Positive for cough. Choking: productive, thick yellow.   Cardiovascular: Positive for leg swelling (intermitent).  Endocrine: Negative.   Genitourinary: Negative for urgency and frequency.  Musculoskeletal: Positive for arthralgias (Left shoulder, continued).  Allergic/Immunologic: Negative.   Neurological: Positive for headaches. Negative for dizziness and light-headedness.  Psychiatric/Behavioral: Negative for confusion. The patient is nervous/anxious.        Objective:   Physical Exam  Nursing note and vitals reviewed. Constitutional: She is oriented to person, place, and time. She appears well-developed and well-nourished. No distress.  The patient is alert but overweight  HENT:  Head: Normocephalic and atraumatic.  Right Ear: External ear normal.  Left Ear: External ear normal.  Mouth/Throat: No oropharyngeal exudate.  There is nasal pallor and turbinate swelling bilaterally and minimal erythema in her throat. There is no drainage obvious in the throat or nose  Eyes: Conjunctivae and EOM are normal. Pupils are equal, round, and reactive to light. Right eye exhibits no discharge. Left eye exhibits no discharge. No scleral icterus.  Neck: Normal range of motion. Neck supple. No thyromegaly present.  Cardiovascular: Normal rate, regular rhythm, normal heart sounds and intact distal pulses.   No murmur heard. The rhythm is regular  at 84 per minute  Pulmonary/Chest: Effort normal and breath sounds normal. No respiratory distress. She has no wheezes. She has no rales. She exhibits no tenderness.  The lungs are clear anteriorly and posteriorly but there are a few rhonchi with coughing  Abdominal: Soft. Bowel sounds are normal. She exhibits no mass. There is no tenderness. There is no rebound and no guarding.  Musculoskeletal: Normal range of motion. She exhibits no edema  and no tenderness.  Lymphadenopathy:    She has no cervical adenopathy.  Neurological: She is alert and oriented to person, place, and time.  Skin: Skin is warm and dry. No rash noted.  Psychiatric: She has a normal mood and affect. Her behavior is normal. Judgment and thought content normal.   BP 175/76  Pulse 100  Temp(Src) 97.6 F (36.4 C) (Oral)        Assessment & Plan:  1. Essential hypertension, benign - POCT CBC - BMP8+EGFR  2. Seizures - Phenobarbital level -Keppra level  3. Other malaise and fatigue - POCT CBC - BMP8+EGFR - Thyroid Panel With TSH  4. Hyperlipemia - NMR, lipoprofile - Hepatic function panel  5. Iron deficiency anemia - POCT CBC  6. Vitamin D deficiency - Vitamin D 1,25 dihydroxy  7. Gout, unspecified - Uric acid  8. High risk medication use - Levetiracetam level  9. Acute bronchitis, unspecified organism - azithromycin (ZITHROMAX) 250 MG tablet; 2 the first day then one daily for infection until complete  Dispense: 6 tablet; Refill: 0  Meds ordered this encounter  Medications  . azithromycin (ZITHROMAX) 250 MG tablet    Sig: 2 the first day then one daily for infection until complete    Dispense:  6 tablet    Refill:  0   Patient Instructions  Continue current medications. Continue good therapeutic lifestyle changes which include good diet and exercise. Fall precautions discussed with patient. If an FOBT was given today- please return it to our front desk. If you are over 30 years old - you may need Prevnar 40 or the adult Pneumonia vaccine.  Flu Shots will be available at our office starting mid- September. Please call and schedule a FLU CLINIC APPOINTMENT.  Use saline nose spray frequently in the day Use  Mucinex maximum strength, blue and white in color, over-the-counter, one twice daily with a large glass of water to loosen your cough Drink plenty of fluids Watch sodium intake Monitor blood pressures at home and bring  these readings back in 2 weeks for review We will call you with the results of your lab work is as those results are available Take Tylenol as needed for aches pains and fever   Arrie Senate MD

## 2014-01-13 LAB — HEPATIC FUNCTION PANEL
ALT: 15 IU/L (ref 0–32)
AST: 21 IU/L (ref 0–40)
Albumin: 4.5 g/dL (ref 3.5–4.7)
Alkaline Phosphatase: 62 IU/L (ref 39–117)
Bilirubin, Direct: 0.1 mg/dL (ref 0.00–0.40)
TOTAL PROTEIN: 7.1 g/dL (ref 6.0–8.5)
Total Bilirubin: 0.2 mg/dL (ref 0.0–1.2)

## 2014-01-13 LAB — NMR, LIPOPROFILE
Cholesterol: 180 mg/dL (ref 100–199)
HDL Cholesterol by NMR: 42 mg/dL (ref >39)
HDL PARTICLE NUMBER: 33.2 umol/L (ref >=30.5)
LDL Particle Number: 916 nmol/L (ref <1000)
LDL Size: 20.9 nm (ref >20.5)
LDLC SERPL CALC-MCNC: 95 mg/dL (ref 0–99)
LP-IR Score: 50 — ABNORMAL HIGH (ref <=45)
Small LDL Particle Number: 554 nmol/L — ABNORMAL HIGH (ref <=527)
Triglycerides by NMR: 215 mg/dL — ABNORMAL HIGH (ref 0–149)

## 2014-01-13 LAB — BMP8+EGFR
BUN / CREAT RATIO: 14 (ref 11–26)
BUN: 20 mg/dL (ref 8–27)
CO2: 26 mmol/L (ref 18–29)
CREATININE: 1.41 mg/dL — AB (ref 0.57–1.00)
Calcium: 10 mg/dL (ref 8.7–10.3)
Chloride: 99 mmol/L (ref 97–108)
GFR calc Af Amer: 38 mL/min/{1.73_m2} — ABNORMAL LOW (ref >59)
GFR calc non Af Amer: 33 mL/min/{1.73_m2} — ABNORMAL LOW (ref >59)
Glucose: 110 mg/dL — ABNORMAL HIGH (ref 65–99)
Potassium: 4.2 mmol/L (ref 3.5–5.2)
Sodium: 141 mmol/L (ref 134–144)

## 2014-01-13 LAB — THYROID PANEL WITH TSH
Free Thyroxine Index: 1.5 (ref 1.2–4.9)
T3 Uptake Ratio: 29 % (ref 24–39)
T4 TOTAL: 5.1 ug/dL (ref 4.5–12.0)
TSH: 5.28 u[IU]/mL — AB (ref 0.450–4.500)

## 2014-01-13 LAB — PHENOBARBITAL LEVEL: PHENOBARBITAL LVL: 10 ug/mL — AB (ref 15–40)

## 2014-01-13 LAB — LEVETIRACETAM LEVEL: Levetiracetam Lvl: 52.2 ug/mL — ABNORMAL HIGH (ref 10.0–40.0)

## 2014-01-13 LAB — URIC ACID: Uric Acid: 4.6 mg/dL (ref 2.5–7.1)

## 2014-01-13 LAB — VITAMIN D 1,25 DIHYDROXY: Vit D, 1,25-Dihydroxy: 57.2 pg/mL (ref 19.9–79.3)

## 2014-01-22 ENCOUNTER — Telehealth: Payer: Self-pay | Admitting: Family Medicine

## 2014-01-25 ENCOUNTER — Encounter: Payer: Self-pay | Admitting: Family Medicine

## 2014-01-25 ENCOUNTER — Ambulatory Visit (INDEPENDENT_AMBULATORY_CARE_PROVIDER_SITE_OTHER): Payer: Medicare Other | Admitting: Family Medicine

## 2014-01-25 VITALS — BP 154/82 | HR 104 | Temp 97.4°F | Ht 65.0 in | Wt 271.0 lb

## 2014-01-25 DIAGNOSIS — M542 Cervicalgia: Secondary | ICD-10-CM

## 2014-01-25 DIAGNOSIS — G8929 Other chronic pain: Secondary | ICD-10-CM

## 2014-01-25 DIAGNOSIS — J069 Acute upper respiratory infection, unspecified: Secondary | ICD-10-CM

## 2014-01-25 DIAGNOSIS — I10 Essential (primary) hypertension: Secondary | ICD-10-CM

## 2014-01-25 DIAGNOSIS — Z1212 Encounter for screening for malignant neoplasm of rectum: Secondary | ICD-10-CM

## 2014-01-25 NOTE — Patient Instructions (Signed)
Bring blood pressures for review in a couple weeks Watch sodium intake Take Tylenol for arthritis pain Reschedule eye exam

## 2014-01-25 NOTE — Progress Notes (Signed)
Subjective:    Patient ID: Stephens [name], female    DOB: 03/27/1924, 80 y.o.   MRN: [id number]  HPI Patient here for 2 week follow up on recent cold/ cough and on elevated blood pressure. She is accompanied today by her daughter. The patient is still having congestion and cough. The congestion is clear and the cough is better. The patient's home blood pressures are still running high for the systolic or the top number.        Patient Active Problem List   Diagnosis Date Noted  . Hypertension 08/21/2012  . Hiatal hernia 08/21/2012  . Seizures 08/21/2012   Outpatient Encounter Prescriptions as of 01/25/2014  Medication Sig  . acyclovir (ZOVIRAX) 800 MG tablet Take 1 tablet (800 mg total) by mouth 5 (five) times daily.  . ALREX 0.2 % SUSP   . aspirin 81 MG tablet Take 81 mg by mouth daily.  Marland Kitchen BENICAR 20 MG tablet TAKE 1 TABLET (20 MG TOTAL) BY MOUTH DAILY.  . ferrous sulfate 325 (65 FE) MG tablet Take 325 mg by mouth daily with breakfast.  . furosemide (LASIX) 80 MG tablet TAKE 1 TABLET IN THE MORNING AND 1 TABLET AT NOON DAILY  . levETIRAcetam (KEPPRA) 1000 MG tablet Take 1 tablet (1,000 mg total) by mouth 2 (two) times daily.  . Multiple Vitamins-Minerals (MULTIVITAMIN WITH MINERALS) tablet Take 1 tablet by mouth daily.  Marland Kitchen PHENobarbital (LUMINAL) 97.2 MG tablet TAKE 1 TABLET BY MOUTH DAILY  . ULORIC 80 MG TABS TAKE 1 TABLET BY MOUTH EVERY DAY  . [DISCONTINUED] azithromycin (ZITHROMAX) 250 MG tablet 2 the first day then one daily for infection until complete    Review of Systems  Constitutional: Negative.   HENT: Positive for congestion (more clear drainage now).   Eyes: Negative.   Respiratory: Negative.  Cough: is better.   Cardiovascular: Negative.   Gastrointestinal: Negative.   Endocrine: Negative.   Genitourinary: Negative.   Musculoskeletal: Negative.   Skin: Negative.   Allergic/Immunologic: Negative.   Neurological: Negative.   Hematological: Negative.     Psychiatric/Behavioral: Negative.        Objective:   Physical Exam  Nursing note and vitals reviewed. Constitutional: She is oriented to person, place, and time. She appears well-developed and well-nourished. No distress.  Alert and cooperative  HENT:  Head: Normocephalic and atraumatic.  Right Ear: External ear normal.  Left Ear: External ear normal.  Mouth/Throat: Oropharynx is clear and moist.  Nasal turbinate congestion  Eyes: Conjunctivae and EOM are normal. Pupils are equal, round, and reactive to light. Right eye exhibits no discharge. Left eye exhibits no discharge. No scleral icterus.  Neck: Normal range of motion. Neck supple. No thyromegaly present.  Cardiovascular: Normal rate, regular rhythm, normal heart sounds and intact distal pulses.  Exam reveals no gallop and no friction rub.   No murmur heard. Heart is 84 per minute with a regular rate and rhythm  Pulmonary/Chest: Effort normal and breath sounds normal. No respiratory distress. She has no wheezes. She has no rales. She exhibits no tenderness.  Lungs are clear anteriorly and posteriorly  Abdominal: Soft. Bowel sounds are normal. She exhibits no mass. There is no tenderness. There is no rebound and no guarding.  Abdomen is obese and nontender  Musculoskeletal: Normal range of motion. She exhibits edema. She exhibits no tenderness.  There is minimal pedal edema.  Lymphadenopathy:    She has no cervical adenopathy.  Neurological: She is alert and oriented to  person, place, and time.  Skin: Skin is warm and dry. No rash noted.  Psychiatric: She has a normal mood and affect. Her behavior is normal. Judgment and thought content normal.   BP 154/82  Pulse 104  Temp(Src) 97.4 F (36.3 C) (Oral)  Ht 5\' 5"  (1.651 m)  Wt 271 lb (122.925 kg)  BMI 45.10 kg/m2        Assessment & Plan:  1. Essential hypertension, benign  2. Chronic neck pain  3. Upper respiratory infection -Improved  Patient Instructions   Bring blood pressures for review in a couple weeks Watch sodium intake Take Tylenol for arthritis pain Reschedule eye exam   Arrie Senate MD

## 2014-01-25 NOTE — Addendum Note (Signed)
Addended by: Selmer Dominion on: 01/25/2014 05:14 PM   Modules accepted: Orders

## 2014-01-26 ENCOUNTER — Ambulatory Visit: Payer: Medicare Other | Admitting: Family Medicine

## 2014-01-26 LAB — FECAL OCCULT BLOOD, IMMUNOCHEMICAL: FECAL OCCULT BLD: NEGATIVE

## 2014-02-01 ENCOUNTER — Other Ambulatory Visit: Payer: Self-pay | Admitting: Family Medicine

## 2014-03-03 ENCOUNTER — Other Ambulatory Visit: Payer: Self-pay | Admitting: Family Medicine

## 2014-03-16 ENCOUNTER — Other Ambulatory Visit: Payer: Self-pay | Admitting: Family Medicine

## 2014-03-29 ENCOUNTER — Encounter: Payer: Self-pay | Admitting: Neurology

## 2014-03-29 ENCOUNTER — Ambulatory Visit (INDEPENDENT_AMBULATORY_CARE_PROVIDER_SITE_OTHER): Payer: Medicare Other | Admitting: Neurology

## 2014-03-29 VITALS — BP 185/102 | HR 94 | Ht 64.0 in | Wt 272.0 lb

## 2014-03-29 DIAGNOSIS — R569 Unspecified convulsions: Secondary | ICD-10-CM

## 2014-03-29 MED ORDER — LEVETIRACETAM 1000 MG PO TABS: 1000.0000 mg | ORAL_TABLET | Freq: Two times a day (BID) | ORAL | Status: DC

## 2014-03-29 MED ORDER — PHENOBARBITAL 97.2 MG PO TABS: 97.2000 mg | ORAL_TABLET | Freq: Every day | ORAL | Status: DC

## 2014-03-29 MED ORDER — GABAPENTIN 100 MG PO CAPS: 100.0000 mg | ORAL_CAPSULE | Freq: Three times a day (TID) | ORAL | Status: DC

## 2014-03-29 NOTE — Progress Notes (Signed)
Reason for visit: Seizures  [name] is an 80 y.o. female  History of present illness:  [name] is an 80 year old right-handed black female with a history of seizures. The patient has partial complex type seizures, but she reports that she has not had any seizures over the last one year. The patient is on phenobarbital and Keppra. She has had levels drawn in September 2015 the patient had a phenobarbital level of 10, and a Keppra level that was high at 52.2. The patient however, is not having any symptoms on the medication. The patient does not operate a motor vehicle. She has not had any significant issues with memory problems. She does have some chronic renal insufficiency, and this may be elevating the Keppra blood levels. The patient reports some chronic neck, right ear, and right arm discomfort. She has undergone an epidural steroid injection previously by Dr. Nelva Bush which was not effective. The patient has a lot of arthritis pain as well. She also reports some issues with swelling in the feet. She returns for an evaluation.  Past Medical History  Diagnosis Date  . Hypertension   . Hiatal hernia   . Stroke   . Edema   . Neurogenic claudication   . Seizures   . Internal hemorrhoids   . Eczema   . Obesity   . Seizure disorder   . Headache   . Gout   . Degenerative arthritis   . Chronic low back pain     Past Surgical History  Procedure Laterality Date  . Bilateral knees    . Appendectomy    . Mammoplasty reduction    . Neuroplasty / transposition median nerve at carpal tunnel bilateral    . Tubal ligation    . Cataracts    . Cholecystectomy    . Abdominal hysterectomy    . Appendectomy    . Re-excision of breast lumpectomy      Family History  Problem Relation Age of Onset  . Diabetes Mother   . Heart disease Mother   . Heart disease Father   . Cancer Sister   . Cancer Brother   . Cancer Sister     Social history:  reports that she has never smoked.  She has never used smokeless tobacco. She reports that she does not drink alcohol or use illicit drugs.    Allergies  Allergen Reactions  . Advicor [Niacin-Lovastatin Er]   . Caduet [Amlodipine-Atorvastatin]   . Codeine   . Dilantin [Phenytoin]   . Lescol [Fluvastatin]   . Pamelor [Nortriptyline]   . Penicillins   . Sulfa Antibiotics   . Trileptal [Oxcarbazepine]   . Ultram [Tramadol]   . Welchol [Colesevelam]     Medications:  Current Outpatient Prescriptions on File Prior to Visit  Medication Sig Dispense Refill  . aspirin 81 MG tablet Take 81 mg by mouth daily.    Marland Kitchen BENICAR 20 MG tablet TAKE 1 TABLET (20 MG TOTAL) BY MOUTH DAILY. 30 tablet 5  . ferrous sulfate 325 (65 FE) MG tablet Take 325 mg by mouth daily with breakfast.    . furosemide (LASIX) 80 MG tablet TAKE 1 TABLET IN THE MORNING AND 1 TABLET AT NOON DAILY 60 tablet 4  . Multiple Vitamins-Minerals (MULTIVITAMIN WITH MINERALS) tablet Take 1 tablet by mouth daily.    Marland Kitchen ULORIC 80 MG TABS TAKE 1 TABLET BY MOUTH EVERY DAY 90 tablet 1   No current facility-administered medications on file prior to visit.  ROS:  Out of a complete 14 system review of symptoms, the patient complains only of the following symptoms, and all other reviewed systems are negative.  Blurred vision Leg swelling Numbness  Itching   Blood pressure 185/102, pulse 94, height 5\' 4"  (1.626 m), weight 272 lb (123.378 kg).  Physical Exam  General: The patient is alert and cooperative at the time of the examination.The patient is moderately to markedly obese.  Skin: 1+ edema to ankles is noted.   Neurologic Exam  Mental status: The patient is oriented x 3.  Cranial nerves: Facial symmetry is present. Speech is normal, no aphasia or dysarthria is noted. Extraocular movements are full. Visual fields are full.  Motor: The patient has good strength in all 4 extremities.  Sensory examination: Soft touch sensation is symmetric on the face,  arms, and legs.  Coordination: The patient has good finger-nose-finger and heel-to-shin bilaterally.  Gait and station: The patient has a wide-based gait. The patient walks with a cane, and gait was not attempted.  Romberg is negative. No drift is seen.  Reflexes: Deep tendon reflexes are symmetric, but are depressed.     Assessment/Plan:  1. History of seizures  2. Chronic neck, left arm pain  3. Obesity  The patient is doing well with her seizures, we will not alter the seizure medication regimen. The patient will follow-up in one year. The patient will be given a low dose of gabapentin for the neck and arm discomfort. The patient likely has cervical arthritis.    Jill Alexanders MD 03/29/2014 8:57 PM  [facility] [address] [place], [place] [address]  Phone [phone] Fax [phone]

## 2014-03-29 NOTE — Patient Instructions (Signed)

## 2014-05-06 ENCOUNTER — Ambulatory Visit (INDEPENDENT_AMBULATORY_CARE_PROVIDER_SITE_OTHER): Payer: Medicare Other | Admitting: Family Medicine

## 2014-05-06 ENCOUNTER — Encounter: Payer: Self-pay | Admitting: Family Medicine

## 2014-05-06 VITALS — BP 196/97 | HR 98 | Temp 97.2°F | Ht 64.0 in | Wt 269.0 lb

## 2014-05-06 DIAGNOSIS — M25512 Pain in left shoulder: Secondary | ICD-10-CM

## 2014-05-06 DIAGNOSIS — M25511 Pain in right shoulder: Secondary | ICD-10-CM

## 2014-05-06 DIAGNOSIS — M159 Polyosteoarthritis, unspecified: Secondary | ICD-10-CM

## 2014-05-06 DIAGNOSIS — I10 Essential (primary) hypertension: Secondary | ICD-10-CM

## 2014-05-06 DIAGNOSIS — E559 Vitamin D deficiency, unspecified: Secondary | ICD-10-CM

## 2014-05-06 DIAGNOSIS — M542 Cervicalgia: Secondary | ICD-10-CM

## 2014-05-06 DIAGNOSIS — R7989 Other specified abnormal findings of blood chemistry: Secondary | ICD-10-CM

## 2014-05-06 DIAGNOSIS — E785 Hyperlipidemia, unspecified: Secondary | ICD-10-CM

## 2014-05-06 DIAGNOSIS — D509 Iron deficiency anemia, unspecified: Secondary | ICD-10-CM

## 2014-05-06 DIAGNOSIS — R569 Unspecified convulsions: Secondary | ICD-10-CM

## 2014-05-06 DIAGNOSIS — G629 Polyneuropathy, unspecified: Secondary | ICD-10-CM

## 2014-05-06 DIAGNOSIS — R946 Abnormal results of thyroid function studies: Secondary | ICD-10-CM

## 2014-05-06 DIAGNOSIS — M150 Primary generalized (osteo)arthritis: Secondary | ICD-10-CM

## 2014-05-06 MED ORDER — OLMESARTAN MEDOXOMIL 20 MG PO TABS: ORAL_TABLET | ORAL | Status: DC

## 2014-05-06 NOTE — Addendum Note (Signed)
Addended by: Earlene Plater on: 05/06/2014 12:55 PM   Modules accepted: Orders

## 2014-05-06 NOTE — Progress Notes (Signed)
Subjective:    Patient ID: [name], female    DOB: [birth year], 81 y.o.   MRN: [id number]  HPI Pt here for follow up and management of chronic medical problems which include hypertension, hyperlipidemia, and seizure disorder. She is taking medications regularly. She is accompanied today by her daughter. Patient is having nasal congestion, headaches, and increased swelling in her feet. Her blood pressure is also elevated today. The patient saw Dr. Jannifer Franklin, the neurologist in December and no changes were made in her treatment regimen regarding her seizures. Noted that at that time her blood pressure was elevated. On her last visit here the blood pressure is elevated at 154/82. The patient's last creatinine was 1.41 and this was in September. Her TSH was also elevated at that time. Both of these will need to be followed up on today. Recent x-rays on this patient were reviewed regarding her cervical spine and left shoulder. Left shoulder films indicate she had degenerative arthritis. The C-spine films less than 1 year ago indication moderate degree of arthritis in her cervical spine.        Patient Active Problem List   Diagnosis Date Noted  . Hypertension 08/21/2012  . Hiatal hernia 08/21/2012  . Seizures 08/21/2012   Outpatient Encounter Prescriptions as of 05/06/2014  Medication Sig  . aspirin 81 MG tablet Take 81 mg by mouth daily.  Marland Kitchen BENICAR 20 MG tablet TAKE 1 TABLET (20 MG TOTAL) BY MOUTH DAILY.  . ferrous sulfate 325 (65 FE) MG tablet Take 325 mg by mouth daily with breakfast.  . furosemide (LASIX) 80 MG tablet TAKE 1 TABLET IN THE MORNING AND 1 TABLET AT NOON DAILY  . gabapentin (NEURONTIN) 100 MG capsule Take 1 capsule (100 mg total) by mouth 3 (three) times daily.  Marland Kitchen levETIRAcetam (KEPPRA) 1000 MG tablet Take 1 tablet (1,000 mg total) by mouth 2 (two) times daily.  . Multiple Vitamins-Minerals (MULTIVITAMIN WITH MINERALS) tablet Take 1 tablet by mouth daily.  Marland Kitchen PHENobarbital  (LUMINAL) 97.2 MG tablet Take 1 tablet (97.2 mg total) by mouth daily.  Marland Kitchen ULORIC 80 MG TABS TAKE 1 TABLET BY MOUTH EVERY DAY    Review of Systems  Constitutional: Negative.   HENT: Positive for congestion (nasal ).   Eyes: Negative.   Respiratory: Negative.   Cardiovascular: Positive for leg swelling (bilateral).  Gastrointestinal: Negative.   Endocrine: Negative.   Genitourinary: Negative.   Musculoskeletal: Positive for arthralgias (bilateral legs).  Skin: Negative.   Allergic/Immunologic: Negative.   Neurological: Positive for headaches (and trouble with eyes - Dr Valetta Close ).  Hematological: Negative.   Psychiatric/Behavioral: Negative.        Objective:   Physical Exam  Constitutional: She is oriented to person, place, and time. She appears well-developed and well-nourished. No distress.  The patient is alert and overweight and this is no change from the past.  HENT:  Head: Normocephalic and atraumatic.  Right Ear: External ear normal.  Left Ear: External ear normal.  Mouth/Throat: Oropharynx is clear and moist.  There is some nasal congestion bilaterally  Eyes: Conjunctivae and EOM are normal. Pupils are equal, round, and reactive to light. Right eye exhibits no discharge. Left eye exhibits no discharge. No scleral icterus.  Neck: Normal range of motion. Neck supple. No thyromegaly present.  There are no carotid bruits or anterior cervical adenopathy.  Cardiovascular: Normal rate, regular rhythm, normal heart sounds and intact distal pulses.   No murmur heard. Heart has a regular rate and  rhythm at 72/m.  Pulmonary/Chest: Effort normal and breath sounds normal. No respiratory distress. She has no wheezes. She has no rales. She exhibits no tenderness.  Abdominal: Soft. Bowel sounds are normal. She exhibits no mass. There is no tenderness. There is no rebound and no guarding.  The abdomen is obese without masses or tenderness. There are no abdominal bruits.  Musculoskeletal:  Normal range of motion. She exhibits no edema or tenderness.  She has 1+ pretibial and ankle edema on the right and minimal edema on the left. She is unable to raise her arms above shoulder length due to pain in neck and shoulders.  Lymphadenopathy:    She has no cervical adenopathy.  Neurological: She is alert and oriented to person, place, and time. She has normal reflexes. No cranial nerve deficit.  Skin: Skin is warm and dry. No rash noted.  Psychiatric: She has a normal mood and affect. Her behavior is normal. Judgment and thought content normal.  Nursing note and vitals reviewed.  BP 196/97 mmHg  Pulse 98  Temp(Src) 97.2 F (36.2 C) (Oral)  Ht '5\' 4"'  (1.626 m)  Wt 269 lb (122.018 kg)  BMI 46.15 kg/m2        Assessment & Plan:  1. Essential hypertension, benign -Home blood pressures have been as low as the 120s over the 80s. -The daughter will bring blood pressure readings by for review in a couple weeks - POCT CBC - BMP8+EGFR - Hepatic function panel  2. Hyperlipemia -The patient is statin intolerant and has to control her medication and exercise, she should continue to do this as aggressively as possible - POCT CBC - Lipid panel  3. Seizures -Continue follow-up with the neurologist as he determines - POCT CBC  4. Iron deficiency anemia -We will check the CBC today and we'll determine about the continued need of the iron. - POCT CBC  5. Vitamin D deficiency -See future lab work for any changes in treatment of this problem - POCT CBC - Vit D  25 hydroxy (rtn osteoporosis monitoring)  6. Elevated TSH -Previous TSH was elevated and we will recheck this today and start medication as needed - Thyroid Panel With TSH  7. Primary osteoarthritis involving multiple joints -Because of your continued neck and shoulder pain and after reviewing your C-spine films we will arrange for you to have an MRI of the cervical spine and we'll make up for appropriate referral if  necessary to help alleviate some of the pain you're now experiencing  8. 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 olmesartan (BENICAR) 20 MG tablet    Sig: TAKE 1 TABLET (20 MG TOTAL) BY MOUTH DAILY.    Dispense:  90 tablet    Refill:  3   Patient Instructions                       Medicare Annual Wellness Visit  [facility] and the medical providers at [facility] strive to bring you the best medical care.  In doing so we not only want to address your current medical conditions and concerns but also to detect new conditions early and prevent illness, disease and health-related problems.    Medicare offers a yearly Wellness Visit which allows our clinical staff to assess your need for preventative services including immunizations, lifestyle education, counseling to decrease risk of preventable diseases and screening for fall risk and  other medical concerns.    This visit is provided free of charge (no copay) for all Medicare recipients. The clinical pharmacists at [facility] have begun to conduct these Wellness Visits which will also include a thorough review of all your medications.    As you primary medical provider recommend that you make an appointment for your Annual Wellness Visit if you have not done so already this year.  You may set up this appointment before you leave today or you may call back [phone]) and schedule an appointment.  Please make sure when you call that you mention that you are scheduling your Annual Wellness Visit with the clinical pharmacist so that the appointment may be made for the proper length of time.      Pneumococcal Vaccine, Polyvalent suspension for injection What is this medicine? PNEUMOCOCCAL VACCINE, POLYVALENT (NEU mo KOK al vak SEEN, pol ee VEY luhnt) is a vaccine to prevent pneumococcus bacteria infection. These bacteria  are a major cause of ear infections, 'Strep throat' infections, and serious pneumonia, meningitis, or blood infections worldwide. These vaccines help the body to produce antibodies (protective substances) that help your body defend against these bacteria. This vaccine is recommended for infants and young children. This vaccine will not treat an infection. This medicine may be used for other purposes; ask your health care provider or pharmacist if you have questions. COMMON BRAND NAME(S): Prevnar 13 What should I tell my health care provider before I take this medicine? They need to know if you have any of these conditions: -bleeding problems -fever -immune system problems -low platelet count in the blood -seizures -an unusual or allergic reaction to pneumococcal vaccine, diphtheria toxoid, other vaccines, latex, other medicines, foods, dyes, or preservatives -pregnant or trying to get pregnant -breast-feeding How should I use this medicine? This vaccine is for injection into a muscle. It is given by a health care professional. A copy of Vaccine Information Statements will be given before each vaccination. Read this sheet carefully each time. The sheet may change frequently. Talk to your pediatrician regarding the use of this medicine in children. While this drug may be prescribed for children as young as 3 weeks old for selected conditions, precautions do apply. Overdosage: If you think you have taken too much of this medicine contact a poison control center or emergency room at once. NOTE: This medicine is only for you. Do not share this medicine with others. What if I miss a dose? It is important not to miss your dose. Call your doctor or health care professional if you are unable to keep an appointment. What may interact with this medicine? -medicines for cancer chemotherapy -medicines that suppress your immune function -medicines that treat or prevent blood clots like warfarin,  enoxaparin, and dalteparin -steroid medicines like prednisone or cortisone This list may not describe all possible interactions. Give your health care provider a list of all the medicines, herbs, non-prescription drugs, or dietary supplements you use. Also tell them if you smoke, drink alcohol, or use illegal drugs. Some items may interact with your medicine. What should I watch for while using this medicine? Mild fever and pain should go away in 3 days or less. Report any unusual symptoms to your doctor or health care professional. What side effects may I notice from receiving this medicine? Side effects that you should report to your doctor or health care professional as soon as possible: -allergic reactions like skin rash, itching or hives, swelling of the  face, lips, or tongue -breathing problems -confused -fever over 102 degrees F -pain, tingling, numbness in the hands or feet -seizures -unusual bleeding or bruising -unusual muscle weakness Side effects that usually do not require medical attention (report to your doctor or health care professional if they continue or are bothersome): -aches and pains -diarrhea -fever of 102 degrees F or less -headache -irritable -loss of appetite -pain, tender at site where injected -trouble sleeping This list may not describe all possible side effects. Call your doctor for medical advice about side effects. You may report side effects to FDA at 1-800-FDA-1088. Where should I keep my medicine? This does not apply. This vaccine is given in a clinic, pharmacy, doctor's office, or other health care setting and will not be stored at home. NOTE: This sheet is a summary. It may not cover all possible information. If you have questions about this medicine, talk to your doctor, pharmacist, or health care provider.  2015, Elsevier/Gold Standard. (2008-06-15 10:17:22)     Continue current medications. Continue good therapeutic lifestyle changes which  include good diet and exercise. Fall precautions discussed with patient. If an FOBT was given today- please return it to our front desk. If you are over 61 years old - you may need Prevnar 14 or the adult Pneumonia vaccine.  Flu Shots will be available at our office starting mid- September. Please call and schedule a FLU CLINIC APPOINTMENT.   Watch sodium intake Monitor Blood Pressures more closely.  Bring readings by for review in 2 weeks Continue to use saline nose spray For your degenerative arthritis take Tylenol and use warm wet compresses to the joints that are hurting you When you're sitting around in the house during the day elevate your legs and feet higher than the level of your heart Because of your continued problems with your neck and left shoulder we will arrange to get a cervical spine MRI   Arrie Senate MD

## 2014-05-06 NOTE — Patient Instructions (Addendum)
Medicare Annual Wellness Visit  [facility] and the medical providers at [facility] strive to bring you the best medical care.  In doing so we not only want to address your current medical conditions and concerns but also to detect new conditions early and prevent illness, disease and health-related problems.    Medicare offers a yearly Wellness Visit which allows our clinical staff to assess your need for preventative services including immunizations, lifestyle education, counseling to decrease risk of preventable diseases and screening for fall risk and other medical concerns.    This visit is provided free of charge (no copay) for all Medicare recipients. The clinical pharmacists at [facility] have begun to conduct these Wellness Visits which will also include a thorough review of all your medications.    As you primary medical provider recommend that you make an appointment for your Annual Wellness Visit if you have not done so already this year.  You may set up this appointment before you leave today or you may call back [phone] and schedule an appointment.  Please make sure when you call that you mention that you are scheduling your Annual Wellness Visit with the clinical pharmacist so that the appointment may be made for the proper length of time.      Pneumococcal Vaccine, Polyvalent suspension for injection What is this medicine? PNEUMOCOCCAL VACCINE, POLYVALENT (NEU mo KOK al vak SEEN, pol ee VEY luhnt) is a vaccine to prevent pneumococcus bacteria infection. These bacteria are a major cause of ear infections, 'Strep throat' infections, and serious pneumonia, meningitis, or blood infections worldwide. These vaccines help the body to produce antibodies (protective substances) that help your body defend against these bacteria. This vaccine is recommended for infants and young children. This vaccine will not treat an  infection. This medicine may be used for other purposes; ask your health care provider or pharmacist if you have questions. COMMON BRAND NAME(S): Prevnar 13 What should I tell my health care provider before I take this medicine? They need to know if you have any of these conditions: -bleeding problems -fever -immune system problems -low platelet count in the blood -seizures -an unusual or allergic reaction to pneumococcal vaccine, diphtheria toxoid, other vaccines, latex, other medicines, foods, dyes, or preservatives -pregnant or trying to get pregnant -breast-feeding How should I use this medicine? This vaccine is for injection into a muscle. It is given by a health care professional. A copy of Vaccine Information Statements will be given before each vaccination. Read this sheet carefully each time. The sheet may change frequently. Talk to your pediatrician regarding the use of this medicine in children. While this drug may be prescribed for children as young as 54 weeks old for selected conditions, precautions do apply. Overdosage: If you think you have taken too much of this medicine contact a poison control center or emergency room at once. NOTE: This medicine is only for you. Do not share this medicine with others. What if I miss a dose? It is important not to miss your dose. Call your doctor or health care professional if you are unable to keep an appointment. What may interact with this medicine? -medicines for cancer chemotherapy -medicines that suppress your immune function -medicines that treat or prevent blood clots like warfarin, enoxaparin, and dalteparin -steroid medicines like prednisone or cortisone This list may not describe all possible interactions. Give your health care provider a list of all the medicines, herbs, non-prescription drugs, or dietary supplements  you use. Also tell them if you smoke, drink alcohol, or use illegal drugs. Some items may interact with your  medicine. What should I watch for while using this medicine? Mild fever and pain should go away in 3 days or less. Report any unusual symptoms to your doctor or health care professional. What side effects may I notice from receiving this medicine? Side effects that you should report to your doctor or health care professional as soon as possible: -allergic reactions like skin rash, itching or hives, swelling of the face, lips, or tongue -breathing problems -confused -fever over 102 degrees F -pain, tingling, numbness in the hands or feet -seizures -unusual bleeding or bruising -unusual muscle weakness Side effects that usually do not require medical attention (report to your doctor or health care professional if they continue or are bothersome): -aches and pains -diarrhea -fever of 102 degrees F or less -headache -irritable -loss of appetite -pain, tender at site where injected -trouble sleeping This list may not describe all possible side effects. Call your doctor for medical advice about side effects. You may report side effects to FDA at 1-800-FDA-1088. Where should I keep my medicine? This does not apply. This vaccine is given in a clinic, pharmacy, doctor's office, or other health care setting and will not be stored at home. NOTE: This sheet is a summary. It may not cover all possible information. If you have questions about this medicine, talk to your doctor, pharmacist, or health care provider.  2015, Elsevier/Gold Standard. (2008-06-15 10:17:22)     Continue current medications. Continue good therapeutic lifestyle changes which include good diet and exercise. Fall precautions discussed with patient. If an FOBT was given today- please return it to our front desk. If you are over 73 years old - you may need Prevnar 66 or the adult Pneumonia vaccine.  Flu Shots will be available at our office starting mid- September. Please call and schedule a FLU CLINIC  APPOINTMENT.   Watch sodium intake Monitor Blood Pressures more closely.  Bring readings by for review in 2 weeks Continue to use saline nose spray For your degenerative arthritis take Tylenol and use warm wet compresses to the joints that are hurting you When you're sitting around in the house during the day elevate your legs and feet higher than the level of your heart Because of your continued problems with your neck and left shoulder we will arrange to get a cervical spine MRI

## 2014-05-06 NOTE — Addendum Note (Signed)
Addended by: Earlene Plater on: 05/06/2014 12:54 PM   Modules accepted: Orders

## 2014-05-06 NOTE — Addendum Note (Signed)
Addended by: Earlene Plater on: 05/06/2014 01:03 PM   Modules accepted: Orders

## 2014-05-06 NOTE — Addendum Note (Signed)
Addended by: Zannie Cove on: 05/06/2014 12:25 PM   Modules accepted: Orders

## 2014-05-07 LAB — CBC WITH DIFFERENTIAL
BASOS: 1 %
Basophils Absolute: 0.1 10*3/uL (ref 0.0–0.2)
Eos: 4 %
Eosinophils Absolute: 0.3 10*3/uL (ref 0.0–0.4)
HEMATOCRIT: 35.1 % (ref 34.0–46.6)
Hemoglobin: 11.6 g/dL (ref 11.1–15.9)
Immature Grans (Abs): 0 10*3/uL (ref 0.0–0.1)
Immature Granulocytes: 0 %
Lymphocytes Absolute: 2.8 10*3/uL (ref 0.7–3.1)
Lymphs: 44 %
MCH: 24.1 pg — AB (ref 26.6–33.0)
MCHC: 33 g/dL (ref 31.5–35.7)
MCV: 73 fL — ABNORMAL LOW (ref 79–97)
MONOCYTES: 6 %
MONOS ABS: 0.4 10*3/uL (ref 0.1–0.9)
Neutrophils Absolute: 2.8 10*3/uL (ref 1.4–7.0)
Neutrophils Relative %: 45 %
PLATELETS: 159 10*3/uL (ref 150–379)
RBC: 4.81 x10E6/uL (ref 3.77–5.28)
RDW: 15.8 % — AB (ref 12.3–15.4)
WBC: 6.3 10*3/uL (ref 3.4–10.8)

## 2014-05-07 LAB — LIPID PANEL
CHOL/HDL RATIO: 4 ratio (ref 0.0–4.4)
CHOLESTEROL TOTAL: 181 mg/dL (ref 100–199)
HDL: 45 mg/dL (ref >39)
LDL CALC: 97 mg/dL (ref 0–99)
Triglycerides: 195 mg/dL — ABNORMAL HIGH (ref 0–149)
VLDL Cholesterol Cal: 39 mg/dL (ref 5–40)

## 2014-05-07 LAB — HEPATIC FUNCTION PANEL
ALBUMIN: 4.4 g/dL (ref 3.5–4.7)
ALT: 13 IU/L (ref 0–32)
AST: 24 IU/L (ref 0–40)
Alkaline Phosphatase: 70 IU/L (ref 39–117)
BILIRUBIN TOTAL: 0.3 mg/dL (ref 0.0–1.2)
Bilirubin, Direct: 0.11 mg/dL (ref 0.00–0.40)
Total Protein: 7.3 g/dL (ref 6.0–8.5)

## 2014-05-07 LAB — BMP8+EGFR
BUN/Creatinine Ratio: 14 (ref 11–26)
BUN: 20 mg/dL (ref 8–27)
CO2: 34 mmol/L — AB (ref 18–29)
CREATININE: 1.41 mg/dL — AB (ref 0.57–1.00)
Calcium: 10 mg/dL (ref 8.7–10.3)
Chloride: 98 mmol/L (ref 97–108)
GFR calc Af Amer: 38 mL/min/{1.73_m2} — ABNORMAL LOW (ref >59)
GFR, EST NON AFRICAN AMERICAN: 33 mL/min/{1.73_m2} — AB (ref >59)
GLUCOSE: 93 mg/dL (ref 65–99)
POTASSIUM: 4.1 mmol/L (ref 3.5–5.2)
Sodium: 143 mmol/L (ref 134–144)

## 2014-05-07 LAB — THYROID PANEL WITH TSH
Free Thyroxine Index: 1.4 (ref 1.2–4.9)
T3 Uptake Ratio: 30 % (ref 24–39)
T4, Total: 4.7 ug/dL (ref 4.5–12.0)
TSH: 4.82 u[IU]/mL — AB (ref 0.450–4.500)

## 2014-05-07 LAB — VITAMIN D 25 HYDROXY (VIT D DEFICIENCY, FRACTURES): Vit D, 25-Hydroxy: 28.7 ng/mL — ABNORMAL LOW (ref 30.0–100.0)

## 2014-05-24 ENCOUNTER — Ambulatory Visit
Admission: RE | Admit: 2014-05-24 | Discharge: 2014-05-24 | Disposition: A | Discharge status: Home / Self Care | Payer: Self-pay | Source: Ambulatory Visit | Attending: Family Medicine | Admitting: Family Medicine

## 2014-05-24 DIAGNOSIS — M159 Polyosteoarthritis, unspecified: Secondary | ICD-10-CM

## 2014-05-24 DIAGNOSIS — M150 Primary generalized (osteo)arthritis: Principal | ICD-10-CM

## 2014-05-24 DIAGNOSIS — G629 Polyneuropathy, unspecified: Secondary | ICD-10-CM

## 2014-05-24 DIAGNOSIS — M25512 Pain in left shoulder: Secondary | ICD-10-CM

## 2014-05-24 DIAGNOSIS — M542 Cervicalgia: Secondary | ICD-10-CM

## 2014-05-24 DIAGNOSIS — M25511 Pain in right shoulder: Secondary | ICD-10-CM

## 2014-05-25 ENCOUNTER — Other Ambulatory Visit: Payer: Self-pay | Admitting: *Deleted

## 2014-05-25 DIAGNOSIS — R221 Localized swelling, mass and lump, neck: Secondary | ICD-10-CM

## 2014-05-25 DIAGNOSIS — M4802 Spinal stenosis, cervical region: Secondary | ICD-10-CM

## 2014-05-26 ENCOUNTER — Encounter: Payer: Self-pay | Admitting: *Deleted

## 2014-05-31 ENCOUNTER — Other Ambulatory Visit: Payer: Medicare Other

## 2014-06-02 ENCOUNTER — Other Ambulatory Visit: Payer: Self-pay | Admitting: Family Medicine

## 2014-06-02 ENCOUNTER — Ambulatory Visit
Admission: RE | Admit: 2014-06-02 | Discharge: 2014-06-02 | Disposition: A | Discharge status: Home / Self Care | Payer: Medicare Other | Source: Ambulatory Visit | Attending: Family Medicine | Admitting: Family Medicine

## 2014-06-02 DIAGNOSIS — R221 Localized swelling, mass and lump, neck: Secondary | ICD-10-CM

## 2014-06-08 ENCOUNTER — Telehealth: Payer: Self-pay | Admitting: *Deleted

## 2014-06-08 NOTE — Telephone Encounter (Signed)
Daughter aware to call Dr Overton Mam office to set up appt .  (they had been trying to reach them )

## 2014-06-18 ENCOUNTER — Other Ambulatory Visit: Payer: Self-pay | Admitting: Family Medicine

## 2014-06-18 NOTE — Telephone Encounter (Signed)
I do not see on patients medication list is it ok to fill

## 2014-07-31 ENCOUNTER — Other Ambulatory Visit: Payer: Self-pay | Admitting: Family Medicine

## 2014-08-19 ENCOUNTER — Other Ambulatory Visit: Payer: Self-pay | Admitting: Family Medicine

## 2014-09-16 ENCOUNTER — Ambulatory Visit: Payer: Medicare Other | Admitting: Family Medicine

## 2014-09-21 ENCOUNTER — Ambulatory Visit: Payer: Medicare Other | Admitting: Family Medicine

## 2014-09-24 ENCOUNTER — Encounter: Payer: Self-pay | Admitting: Family Medicine

## 2014-09-30 ENCOUNTER — Ambulatory Visit (INDEPENDENT_AMBULATORY_CARE_PROVIDER_SITE_OTHER): Payer: Medicare Other | Admitting: Family Medicine

## 2014-09-30 ENCOUNTER — Encounter: Payer: Self-pay | Admitting: Family Medicine

## 2014-09-30 VITALS — BP 151/84 | HR 87 | Temp 97.4°F | Ht 64.0 in | Wt 278.0 lb

## 2014-09-30 DIAGNOSIS — I10 Essential (primary) hypertension: Secondary | ICD-10-CM

## 2014-09-30 DIAGNOSIS — R569 Unspecified convulsions: Secondary | ICD-10-CM | POA: Diagnosis not present

## 2014-09-30 DIAGNOSIS — R946 Abnormal results of thyroid function studies: Secondary | ICD-10-CM | POA: Diagnosis not present

## 2014-09-30 DIAGNOSIS — N2581 Secondary hyperparathyroidism of renal origin: Secondary | ICD-10-CM

## 2014-09-30 DIAGNOSIS — D631 Anemia in chronic kidney disease: Secondary | ICD-10-CM | POA: Diagnosis not present

## 2014-09-30 DIAGNOSIS — E559 Vitamin D deficiency, unspecified: Secondary | ICD-10-CM

## 2014-09-30 DIAGNOSIS — R609 Edema, unspecified: Secondary | ICD-10-CM | POA: Insufficient documentation

## 2014-09-30 DIAGNOSIS — E785 Hyperlipidemia, unspecified: Secondary | ICD-10-CM | POA: Diagnosis not present

## 2014-09-30 DIAGNOSIS — N189 Chronic kidney disease, unspecified: Secondary | ICD-10-CM | POA: Insufficient documentation

## 2014-09-30 DIAGNOSIS — R748 Abnormal levels of other serum enzymes: Secondary | ICD-10-CM

## 2014-09-30 DIAGNOSIS — E8770 Fluid overload, unspecified: Secondary | ICD-10-CM | POA: Diagnosis not present

## 2014-09-30 DIAGNOSIS — R7989 Other specified abnormal findings of blood chemistry: Secondary | ICD-10-CM

## 2014-09-30 DIAGNOSIS — D509 Iron deficiency anemia, unspecified: Secondary | ICD-10-CM

## 2014-09-30 DIAGNOSIS — R809 Proteinuria, unspecified: Secondary | ICD-10-CM | POA: Diagnosis not present

## 2014-09-30 DIAGNOSIS — N184 Chronic kidney disease, stage 4 (severe): Secondary | ICD-10-CM | POA: Insufficient documentation

## 2014-09-30 DIAGNOSIS — N183 Chronic kidney disease, stage 3 unspecified: Secondary | ICD-10-CM

## 2014-09-30 LAB — POCT CBC
Granulocyte percent: 46.7 %G (ref 37–80)
HCT, POC: 35.2 % — AB (ref 37.7–47.9)
Hemoglobin: 11 g/dL — AB (ref 12.2–16.2)
Lymph, poc: 3.9 — AB (ref 0.6–3.4)
MCH: 23.8 pg — AB (ref 27–31.2)
MCHC: 31.2 g/dL — AB (ref 31.8–35.4)
MCV: 76.4 fL — AB (ref 80–97)
MPV: 8.7 fL (ref 0–99.8)
PLATELET COUNT, POC: 142 10*3/uL (ref 142–424)
POC Granulocyte: 3.5 (ref 2–6.9)
POC LYMPH %: 51.4 % — AB (ref 10–50)
RBC: 4.61 M/uL (ref 4.04–5.48)
RDW, POC: 15.2 %
WBC: 7.6 10*3/uL (ref 4.6–10.2)

## 2014-09-30 MED ORDER — PHENOBARBITAL 97.2 MG PO TABS: 97.2000 mg | ORAL_TABLET | Freq: Every day | ORAL | Status: DC

## 2014-09-30 MED ORDER — FUROSEMIDE 80 MG PO TABS: ORAL_TABLET | ORAL | Status: DC

## 2014-09-30 NOTE — Patient Instructions (Addendum)
Medicare Annual Wellness Visit  [facility] and the medical providers at [facility] strive to bring you the best medical care.  In doing so we not only want to address your current medical conditions and concerns but also to detect new conditions early and prevent illness, disease and health-related problems.    Medicare offers a yearly Wellness Visit which allows our clinical staff to assess your need for preventative services including immunizations, lifestyle education, counseling to decrease risk of preventable diseases and screening for fall risk and other medical concerns.    This visit is provided free of charge (no copay) for all Medicare recipients. The clinical pharmacists at [facility] have begun to conduct these Wellness Visits which will also include a thorough review of all your medications.    As you primary medical provider recommend that you make an appointment for your Annual Wellness Visit if you have not done so already this year.  You may set up this appointment before you leave today or you may call back [phone] and schedule an appointment.  Please make sure when you call that you mention that you are scheduling your Annual Wellness Visit with the clinical pharmacist so that the appointment may be made for the proper length of time.     Continue current medications. Continue good therapeutic lifestyle changes which include good diet and exercise. Fall precautions discussed with patient. If an FOBT was given today- please return it to our front desk. If you are over 79 years old - you may need Prevnar 69 or the adult Pneumonia vaccine.  Flu Shots are still available at our office. If you still haven't had one please call to set up a nurse visit to get one.   After your visit with [place] today you will receive a survey in the mail or online from [employer] regarding your care with [place]. Please take a moment to  fill this out. Your feedback is very important to [place] as you can help [place] better understand your patient needs as well as improve your experience and satisfaction. WE CARE ABOUT YOU!!!   Follow-up with nephrologist as planned in 6 weeks Take additional medication prescribed by him as directed Watch sodium intake Stay well hydrated Use MiraLAX if needed for constipation Repeat BMP in 3 weeks----you do not have to be seen at that time Lay down a couple times during the day for 20-30 minutes and prop up your feet higher than the level of your heart

## 2014-09-30 NOTE — Progress Notes (Signed)
Subjective:    Patient ID: [name], female    DOB: [birth year], 81 y.o.   MRN: [id number]  HPI Pt here for follow up and management of chronic medical problems which includes hypertension, hyperlipidemia and seizures. She is taking medications regularly. She continues to have edema. She just saw the nephrologist who added Zaroxolyn 3 times weekly to her current medication regimen. She has just started this. She comes to the visit today with her daughter. She also complains of a dry cough and decreased appetite. He is due to get lab work. The patient's last weight in this office was 269 pounds. Her weight today is 278 pounds. Patient denies chest pain shortness of breath or PND. She does complain of some constipation alternating with loose bowel movements. She does complain of a dry throat. She denies any problems passing her water.       Patient Active Problem List   Diagnosis Date Noted  . Hypertension 08/21/2012  . Hiatal hernia 08/21/2012  . Seizures 08/21/2012   Outpatient Encounter Prescriptions as of 09/30/2014  Medication Sig  . acyclovir (ZOVIRAX) 800 MG tablet TAKE 1 TABLET (800 MG TOTAL) BY MOUTH 5 (FIVE) TIMES DAILY.  Marland Kitchen aspirin 81 MG tablet Take 81 mg by mouth daily.  . ferrous sulfate 325 (65 FE) MG tablet Take 325 mg by mouth daily with breakfast.  . furosemide (LASIX) 80 MG tablet TAKE 1 TABLET IN THE MORNING AND 1 TABLET AT NOON DAILY  . gabapentin (NEURONTIN) 100 MG capsule Take 1 capsule (100 mg total) by mouth 3 (three) times daily.  Marland Kitchen levETIRAcetam (KEPPRA) 1000 MG tablet Take 1 tablet (1,000 mg total) by mouth 2 (two) times daily.  . metolazone (ZAROXOLYN) 2.5 MG tablet Take 1 tablet by mouth 3 (three) times a week.  . Multiple Vitamins-Minerals (MULTIVITAMIN WITH MINERALS) tablet Take 1 tablet by mouth daily.  Marland Kitchen olmesartan (BENICAR) 20 MG tablet TAKE 1 TABLET (20 MG TOTAL) BY MOUTH DAILY.  Marland Kitchen PHENobarbital (LUMINAL) 97.2 MG tablet Take 1 tablet (97.2 mg total)  by mouth daily.  Marland Kitchen ULORIC 80 MG TABS TAKE 1 TABLET BY MOUTH EVERY DAY   No facility-administered encounter medications on file as of 09/30/2014.     Review of Systems  Constitutional: Positive for appetite change (decreased).  HENT: Negative.   Eyes: Negative.   Respiratory: Positive for cough ("dry throat").   Cardiovascular: Positive for leg swelling.  Gastrointestinal: Negative.   Endocrine: Negative.   Genitourinary: Negative.   Musculoskeletal: Negative.   Skin: Negative.   Allergic/Immunologic: Negative.   Neurological: Negative.   Hematological: Negative.   Psychiatric/Behavioral: Negative.        Objective:   Physical Exam  Constitutional: She is oriented to person, place, and time. She appears well-developed and well-nourished. No distress.  The patient is pleasant and alert and comes to the visit with her daughter.  HENT:  Head: Normocephalic and atraumatic.  Right Ear: External ear normal.  Left Ear: External ear normal.  Nose: Nose normal.  Mouth/Throat: Oropharynx is clear and moist. No oropharyngeal exudate.  No oropharyngeal abnormalities noted  Eyes: Conjunctivae and EOM are normal. Pupils are equal, round, and reactive to light. Right eye exhibits no discharge. Left eye exhibits no discharge. No scleral icterus.  Neck: Normal range of motion. Neck supple. No thyromegaly present.  The carotid bruits or anterior cervical adenopathy  Cardiovascular: Normal rate, regular rhythm, normal heart sounds and intact distal pulses.   No murmur heard. The heart  is regular at 84/m and no murmurs were audible  Pulmonary/Chest: Effort normal and breath sounds normal. No respiratory distress. She has no wheezes. She has no rales. She exhibits no tenderness.  Clear anteriorly and posteriorly  Abdominal: Soft. Bowel sounds are normal. She exhibits no mass. There is no tenderness. There is no rebound and no guarding.  Morbid obesity  Musculoskeletal: Normal range of motion.  She exhibits edema. She exhibits no tenderness.  Lymphadenopathy:    She has no cervical adenopathy.  Neurological: She is alert and oriented to person, place, and time.  Skin: Skin is warm and dry. No rash noted. No erythema. No pallor.  Psychiatric: She has a normal mood and affect. Her behavior is normal. Judgment and thought content normal.  Nursing note and vitals reviewed.  BP 151/84 mmHg  Pulse 87  Temp(Src) 97.4 F (36.3 C) (Oral)  Ht _0  (1.626 m)  Wt 278 lb (126.1 kg)  BMI 47.70 kg/m2  Patient has ear cerumen in the right ear canal and this will be irrigated to remove the cerumen before she leaves office.      Assessment & Plan:  1. Essential hypertension, benign -Blood pressure slightly elevated today and will be no change in treatment because she is just starting an additional fluid pill. - POCT CBC - BMP8+EGFR - Hepatic function panel  2. Hyperlipemia -The patient should continue with aggressive therapeutic lifestyle changes - POCT CBC - Lipid panel  3. Seizures -She should follow-up with neurology as planned - POCT CBC  4. Iron deficiency anemia -She should continue with her iron replacement as doing pending results of lab work - POCT CBC  5. Vitamin D deficiency -Currently she is not taking any vitamin D because of elevated creatinine. - POCT CBC - Vit D  25 hydroxy (rtn osteoporosis monitoring)  6. Elevated TSH -Awake lab work results before treatment is recommended - POCT CBC  7. Fluid retention -Continue metolazone 3 times weekly as recommended by nephrology  8. Elevated serum creatinine -Follow up with nephrology as planned in 6 weeks  9. Chronic kidney disease, stage III (moderate) -Continue follow-up with renal specialist  10. Anemia associated with chronic renal failure, stage 3 (moderate) -Continue current treatment  11. Proteinuria  12. Secondary hyperparathyroidism, renal   Patient Instructions                        Medicare Annual Wellness Visit  [facility] and the medical providers at [facility] strive to bring you the best medical care.  In doing so we not only want to address your current medical conditions and concerns but also to detect new conditions early and prevent illness, disease and health-related problems.    Medicare offers a yearly Wellness Visit which allows our clinical staff to assess your need for preventative services including immunizations, lifestyle education, counseling to decrease risk of preventable diseases and screening for fall risk and other medical concerns.    This visit is provided free of charge (no copay) for all Medicare recipients. The clinical pharmacists at [facility] have begun to conduct these Wellness Visits which will also include a thorough review of all your medications.    As you primary medical provider recommend that you make an appointment for your Annual Wellness Visit if you have not done so already this year.  You may set up this appointment before you leave today or you may call back [phone])  and schedule an appointment.  Please make sure when you call that you mention that you are scheduling your Annual Wellness Visit with the clinical pharmacist so that the appointment may be made for the proper length of time.     Continue current medications. Continue good therapeutic lifestyle changes which include good diet and exercise. Fall precautions discussed with patient. If an FOBT was given today- please return it to our front desk. If you are over 77 years old - you may need Prevnar 42 or the adult Pneumonia vaccine.  Flu Shots are still available at our office. If you still haven't had one please call to set up a nurse visit to get one.   After your visit with [place] today you will receive a survey in the mail or online from [employer] regarding your care with [place]. Please take a moment to fill this out. Your  feedback is very important to [place] as you can help [place] better understand your patient needs as well as improve your experience and satisfaction. WE CARE ABOUT YOU!!!   Follow-up with nephrologist as planned in 6 weeks Take additional medication prescribed by him as directed Watch sodium intake Stay well hydrated Use MiraLAX if needed for constipation Repeat BMP in 3 weeks----you do not have to be seen at that time Lay down a couple times during the day for 20-30 minutes and prop up your feet higher than the level of your heart   Arrie Senate MD

## 2014-10-01 LAB — BMP8+EGFR
BUN/Creatinine Ratio: 15 (ref 11–26)
BUN: 19 mg/dL (ref 8–27)
CALCIUM: 9.5 mg/dL (ref 8.7–10.3)
CHLORIDE: 101 mmol/L (ref 97–108)
CO2: 26 mmol/L (ref 18–29)
Creatinine, Ser: 1.3 mg/dL — ABNORMAL HIGH (ref 0.57–1.00)
GFR calc non Af Amer: 36 mL/min/{1.73_m2} — ABNORMAL LOW (ref >59)
GFR, EST AFRICAN AMERICAN: 42 mL/min/{1.73_m2} — AB (ref >59)
GLUCOSE: 86 mg/dL (ref 65–99)
POTASSIUM: 4.1 mmol/L (ref 3.5–5.2)
Sodium: 142 mmol/L (ref 134–144)

## 2014-10-01 LAB — LIPID PANEL
CHOL/HDL RATIO: 4.4 ratio (ref 0.0–4.4)
CHOLESTEROL TOTAL: 158 mg/dL (ref 100–199)
HDL: 36 mg/dL — AB (ref >39)
LDL Calculated: 70 mg/dL (ref 0–99)
TRIGLYCERIDES: 260 mg/dL — AB (ref 0–149)
VLDL Cholesterol Cal: 52 mg/dL — ABNORMAL HIGH (ref 5–40)

## 2014-10-01 LAB — HEPATIC FUNCTION PANEL
ALBUMIN: 4.3 g/dL (ref 3.5–4.7)
ALT: 16 IU/L (ref 0–32)
AST: 18 IU/L (ref 0–40)
Alkaline Phosphatase: 62 IU/L (ref 39–117)
Bilirubin, Direct: 0.09 mg/dL (ref 0.00–0.40)
Total Protein: 7.1 g/dL (ref 6.0–8.5)

## 2014-10-01 LAB — VITAMIN D 25 HYDROXY (VIT D DEFICIENCY, FRACTURES): Vit D, 25-Hydroxy: 29.8 ng/mL — ABNORMAL LOW (ref 30.0–100.0)

## 2014-10-25 ENCOUNTER — Encounter: Payer: Self-pay | Admitting: Family Medicine

## 2014-10-25 ENCOUNTER — Ambulatory Visit (INDEPENDENT_AMBULATORY_CARE_PROVIDER_SITE_OTHER): Payer: Medicare Other | Admitting: Family Medicine

## 2014-10-25 ENCOUNTER — Ambulatory Visit (INDEPENDENT_AMBULATORY_CARE_PROVIDER_SITE_OTHER): Payer: Medicare Other

## 2014-10-25 VITALS — BP 124/73 | HR 96 | Temp 97.3°F | Ht 64.0 in | Wt 272.0 lb

## 2014-10-25 DIAGNOSIS — M546 Pain in thoracic spine: Secondary | ICD-10-CM

## 2014-10-25 DIAGNOSIS — R7989 Other specified abnormal findings of blood chemistry: Secondary | ICD-10-CM

## 2014-10-25 DIAGNOSIS — M5440 Lumbago with sciatica, unspecified side: Secondary | ICD-10-CM | POA: Diagnosis not present

## 2014-10-25 DIAGNOSIS — R748 Abnormal levels of other serum enzymes: Secondary | ICD-10-CM | POA: Diagnosis not present

## 2014-10-25 DIAGNOSIS — R946 Abnormal results of thyroid function studies: Secondary | ICD-10-CM | POA: Diagnosis not present

## 2014-10-25 DIAGNOSIS — N183 Chronic kidney disease, stage 3 unspecified: Secondary | ICD-10-CM

## 2014-10-25 DIAGNOSIS — R5383 Other fatigue: Secondary | ICD-10-CM

## 2014-10-25 LAB — POCT CBC
Granulocyte percent: 52 %G (ref 37–80)
HCT, POC: 34.7 % — AB (ref 37.7–47.9)
Hemoglobin: 10.9 g/dL — AB (ref 12.2–16.2)
Lymph, poc: 3.6 — AB (ref 0.6–3.4)
MCH, POC: 22.8 pg — AB (ref 27–31.2)
MCHC: 31.3 g/dL — AB (ref 31.8–35.4)
MCV: 72.8 fL — AB (ref 80–97)
MPV: 8.5 fL (ref 0–99.8)
POC Granulocyte: 4.3 (ref 2–6.9)
POC LYMPH PERCENT: 43.5 %L (ref 10–50)
Platelet Count, POC: 154 10*3/uL (ref 142–424)
RBC: 4.77 M/uL (ref 4.04–5.48)
RDW, POC: 14.4 %
WBC: 8.2 10*3/uL (ref 4.6–10.2)

## 2014-10-25 NOTE — Progress Notes (Signed)
Subjective:    Patient ID: [name], female    DOB: November 03, 1923, 81 y.o.   MRN: [id number]  HPI   81 year old female comes in today with complaints of weakness and general malaise x 1 month.  Patient Active Problem List   Diagnosis Date Noted  . Fluid retention 09/30/2014  . Elevated serum creatinine 09/30/2014  . Essential hypertension, benign 09/30/2014  . Secondary hyperparathyroidism, renal 09/30/2014  . Proteinuria 09/30/2014  . Anemia associated with chronic renal failure 09/30/2014  . Chronic kidney disease, stage III (moderate) 09/30/2014  . Hypertension 08/21/2012  . Hiatal hernia 08/21/2012  . Seizures 08/21/2012   Outpatient Encounter Prescriptions as of 10/25/2014  Medication Sig  . acyclovir (ZOVIRAX) 800 MG tablet TAKE 1 TABLET (800 MG TOTAL) BY MOUTH 5 (FIVE) TIMES DAILY.  Marland Kitchen aspirin 81 MG tablet Take 81 mg by mouth daily.  . ferrous sulfate 325 (65 FE) MG tablet Take 325 mg by mouth daily with breakfast.  . furosemide (LASIX) 80 MG tablet TAKE 1 TABLET IN THE MORNING AND 1 TABLET AT NOON DAILY  . gabapentin (NEURONTIN) 100 MG capsule Take 1 capsule (100 mg total) by mouth 3 (three) times daily.  Marland Kitchen levETIRAcetam (KEPPRA) 1000 MG tablet Take 1 tablet (1,000 mg total) by mouth 2 (two) times daily.  . metolazone (ZAROXOLYN) 2.5 MG tablet Take 1 tablet by mouth 3 (three) times a week.  . Multiple Vitamins-Minerals (MULTIVITAMIN WITH MINERALS) tablet Take 1 tablet by mouth daily.  Marland Kitchen olmesartan (BENICAR) 20 MG tablet TAKE 1 TABLET (20 MG TOTAL) BY MOUTH DAILY.  Marland Kitchen PHENobarbital (LUMINAL) 97.2 MG tablet Take 1 tablet (97.2 mg total) by mouth daily.  Marland Kitchen ULORIC 80 MG TABS TAKE 1 TABLET BY MOUTH EVERY DAY   No facility-administered encounter medications on file as of 10/25/2014.      Review of Systems  Constitutional: Positive for activity change (decrease in activity level) and fatigue.  HENT: Positive for postnasal drip. Drooling: episodic dry cough.   Eyes:  Negative.   Respiratory: Positive for cough and shortness of breath (with exertion).   Cardiovascular: Positive for leg swelling (swelling is improving). Negative for chest pain and palpitations.  Gastrointestinal: Positive for diarrhea and constipation. Negative for abdominal pain and blood in stool. Rectal pain: Large amount of watery stool after starting fluid pill.       Heartburn-Has been taking Alka Seltzer which helps  Endocrine: Negative.   Genitourinary: Negative.   Musculoskeletal: Positive for arthralgias (chronic).  Skin: Positive for rash (areas that come and go on face, hand, and under breast).       Pruritis  Allergic/Immunologic: Negative.   Neurological: Positive for dizziness and headaches (sharp pain).  Hematological: Negative.   Psychiatric/Behavioral: Positive for sleep disturbance.       Objective:   Physical Exam  Constitutional: She is oriented to person, place, and time. She appears well-developed and well-nourished. No distress.  Overall the patient is doing well. She is alert and responding appropriately to questions asked of her  HENT:  Head: Normocephalic and atraumatic.  Eyes: Conjunctivae and EOM are normal. Pupils are equal, round, and reactive to light. Right eye exhibits no discharge. Left eye exhibits no discharge. No scleral icterus.  Neck: Normal range of motion. Neck supple. No thyromegaly present.  The neck is without thyromegaly or adenopathy  Cardiovascular: Normal rate, regular rhythm and normal heart sounds.   No murmur heard. Heart rhythm is regular at 84/m  Pulmonary/Chest: Effort  normal and breath sounds normal. No respiratory distress. She has no wheezes. She has no rales. She exhibits no tenderness.  Lungs are clear anteriorly and posteriorly  Abdominal: Soft. Bowel sounds are normal. She exhibits no mass. There is no tenderness. There is no rebound and no guarding.  The abdomen is obese with normal bowel sounds and without masses or  organ enlargement or suprapubic tenderness.  Musculoskeletal: Normal range of motion. She exhibits no edema or tenderness.  The patient uses a cane for ambulation. She is tender in the lower thoracic upper lumbar spine area to palpation and there is no rash in this area.  Lymphadenopathy:    She has no cervical adenopathy.  Neurological: She is alert and oriented to person, place, and time. She has normal reflexes. No cranial nerve deficit.  Skin: Skin is warm and dry. No rash noted. No erythema.  Psychiatric: She has a normal mood and affect. Her behavior is normal. Judgment and thought content normal.  Nursing note and vitals reviewed.   BP 124/73 mmHg  Pulse 96  Temp(Src) 97.3 F (36.3 C) (Oral)  Ht _0  (1.626 m)  Wt 272 lb (123.378 kg)  BMI 46.67 kg/m2  [facility] reading (PRIMARY) by  Dr. Glendon Axe or lumbar spine films   --- arthritic, degenerative changes and scoliosis                                   Assessment & Plan:  1. Other fatigue -V his TSH was slightly elevated in January and we will recheck her thyroid profile today. - BMP8+EGFR - POCT CBC  2. Chronic kidney disease, stage III (moderate) -The patient follows up regularly with the nephrologist, Dr. Justin Mend. She has been taking Zaroxolyn 3 days a week and this has helped her leg edema tremendously. She will continue to take this 3 days a week until the lab work is returned and we will discuss how she will take it from that point on when that is returned.  3. Elevated serum creatinine -The most recent creatinine is improved and we will be checking that again today because of taking the Zaroxolyn.  4. Elevated TSH -She is complaining of fatigue and she has had an elevated TSH in early January.  5. Midline low back pain with sciatica, sciatica laterality unspecified -Thoracic or lumbar and LS spine films  Patient Instructions  Continue all meds--we may call you back and have you reduce the fluid pill that you're  taking Monday Wednesday and Friday but we will wait until the lab work is returned before we do that. Continue Lifestyle modification- diet and exercise Fall precautions discussed Keep follow up appointments with any specialists We will call you with lab work results once they become available--please combine pickup a copy of the one lab work that we're doing today said that you may take this to the nephrologist     Arrie Senate MD

## 2014-10-25 NOTE — Patient Instructions (Signed)
Continue all meds--we may call you back and have you reduce the fluid pill that you're taking Monday Wednesday and Friday but we will wait until the lab work is returned before we do that. Continue Lifestyle modification- diet and exercise Fall precautions discussed Keep follow up appointments with any specialists We will call you with lab work results once they become available--please combine pickup a copy of the one lab work that we're doing today said that you may take this to the nephrologist

## 2014-10-26 LAB — BMP8+EGFR
BUN/Creatinine Ratio: 26 (ref 11–26)
BUN: 69 mg/dL — AB (ref 8–27)
CO2: 28 mmol/L (ref 18–29)
CREATININE: 2.69 mg/dL — AB (ref 0.57–1.00)
Calcium: 9.9 mg/dL (ref 8.7–10.3)
Chloride: 93 mmol/L — ABNORMAL LOW (ref 97–108)
GFR calc non Af Amer: 15 mL/min/{1.73_m2} — ABNORMAL LOW (ref >59)
GFR, EST AFRICAN AMERICAN: 17 mL/min/{1.73_m2} — AB (ref >59)
Glucose: 123 mg/dL — ABNORMAL HIGH (ref 65–99)
POTASSIUM: 4 mmol/L (ref 3.5–5.2)
SODIUM: 140 mmol/L (ref 134–144)

## 2014-10-26 LAB — THYROID PANEL WITH TSH
FREE THYROXINE INDEX: 1.2 (ref 1.2–4.9)
T3 Uptake Ratio: 30 % (ref 24–39)
T4 TOTAL: 4 ug/dL — AB (ref 4.5–12.0)
TSH: 4.26 u[IU]/mL (ref 0.450–4.500)

## 2014-10-29 ENCOUNTER — Other Ambulatory Visit: Payer: Self-pay | Admitting: Family Medicine

## 2014-11-01 ENCOUNTER — Other Ambulatory Visit (INDEPENDENT_AMBULATORY_CARE_PROVIDER_SITE_OTHER): Payer: Medicare Other

## 2014-11-01 DIAGNOSIS — R799 Abnormal finding of blood chemistry, unspecified: Secondary | ICD-10-CM | POA: Diagnosis not present

## 2014-11-02 ENCOUNTER — Other Ambulatory Visit: Payer: Self-pay | Admitting: Family Medicine

## 2014-11-02 LAB — BMP8+EGFR
BUN / CREAT RATIO: 29 — AB (ref 11–26)
BUN: 51 mg/dL — ABNORMAL HIGH (ref 8–27)
CO2: 25 mmol/L (ref 18–29)
Calcium: 9.7 mg/dL (ref 8.7–10.3)
Chloride: 99 mmol/L (ref 97–108)
Creatinine, Ser: 1.77 mg/dL — ABNORMAL HIGH (ref 0.57–1.00)
GFR, EST AFRICAN AMERICAN: 29 mL/min/{1.73_m2} — AB (ref >59)
GFR, EST NON AFRICAN AMERICAN: 25 mL/min/{1.73_m2} — AB (ref >59)
Glucose: 149 mg/dL — ABNORMAL HIGH (ref 65–99)
POTASSIUM: 4.1 mmol/L (ref 3.5–5.2)
SODIUM: 142 mmol/L (ref 134–144)

## 2014-12-17 ENCOUNTER — Ambulatory Visit (INDEPENDENT_AMBULATORY_CARE_PROVIDER_SITE_OTHER): Payer: Medicare Other | Admitting: Family Medicine

## 2014-12-17 ENCOUNTER — Encounter: Payer: Self-pay | Admitting: Family Medicine

## 2014-12-17 VITALS — BP 138/76 | HR 101 | Temp 97.7°F | Ht 64.0 in | Wt 266.0 lb

## 2014-12-17 DIAGNOSIS — R2681 Unsteadiness on feet: Secondary | ICD-10-CM

## 2014-12-17 DIAGNOSIS — E860 Dehydration: Secondary | ICD-10-CM

## 2014-12-17 DIAGNOSIS — R531 Weakness: Secondary | ICD-10-CM | POA: Diagnosis not present

## 2014-12-17 DIAGNOSIS — R05 Cough: Secondary | ICD-10-CM

## 2014-12-17 DIAGNOSIS — G40909 Epilepsy, unspecified, not intractable, without status epilepticus: Secondary | ICD-10-CM

## 2014-12-17 DIAGNOSIS — R0989 Other specified symptoms and signs involving the circulatory and respiratory systems: Secondary | ICD-10-CM | POA: Diagnosis not present

## 2014-12-17 DIAGNOSIS — D509 Iron deficiency anemia, unspecified: Secondary | ICD-10-CM

## 2014-12-17 DIAGNOSIS — J189 Pneumonia, unspecified organism: Secondary | ICD-10-CM

## 2014-12-17 DIAGNOSIS — R059 Cough, unspecified: Secondary | ICD-10-CM

## 2014-12-17 MED ORDER — ALBUTEROL SULFATE (2.5 MG/3ML) 0.083% IN NEBU: 2.5000 mg | INHALATION_SOLUTION | Freq: Four times a day (QID) | RESPIRATORY_TRACT | Status: DC | PRN

## 2014-12-17 NOTE — Progress Notes (Signed)
Subjective:    Patient ID: [name], female    DOB: [birth year], 81 y.o.   MRN: [id number]  HPI Patient here today for hospital follow up. She was admitted to [facility] for pneumonia and dehydration. She has been in [place] center for rehabilitation for 2 weeks. She is accompanied today by her daughter [name]. The patient is still on the treatments as well as Lasix seizure medication and allopurinol. She has a long history of a seizure disorder. She is in good spirits and alert today during the initial talk with her. She denies any chest pain or shortness of breath currently. Her bowels are moving okay and she is not having any problems with passing her water. She has not had any seizures.       Patient Active Problem List   Diagnosis Date Noted  . Fluid retention 09/30/2014  . Elevated serum creatinine 09/30/2014  . Essential hypertension, benign 09/30/2014  . Secondary hyperparathyroidism, renal 09/30/2014  . Proteinuria 09/30/2014  . Anemia associated with chronic renal failure 09/30/2014  . Chronic kidney disease, stage III (moderate) 09/30/2014  . Hypertension 08/21/2012  . Hiatal hernia 08/21/2012  . Seizures 08/21/2012   Outpatient Encounter Prescriptions as of 12/17/2014  Medication Sig  . acyclovir (ZOVIRAX) 800 MG tablet TAKE 1 TABLET (800 MG TOTAL) BY MOUTH 5 (FIVE) TIMES DAILY.  Marland Kitchen aspirin 81 MG tablet Take 81 mg by mouth daily.  . ferrous sulfate 325 (65 FE) MG tablet Take 325 mg by mouth daily with breakfast.  . furosemide (LASIX) 80 MG tablet TAKE 1 TABLET IN THE MORNING AND 1 TABLET AT NOON DAILY  . gabapentin (NEURONTIN) 100 MG capsule Take 1 capsule (100 mg total) by mouth 3 (three) times daily.  Marland Kitchen levETIRAcetam (KEPPRA) 1000 MG tablet Take 1 tablet (1,000 mg total) by mouth 2 (two) times daily.  . metolazone (ZAROXOLYN) 2.5 MG tablet Take 1 tablet by mouth 3 (three) times a week.  . Multiple Vitamins-Minerals (MULTIVITAMIN WITH MINERALS)  tablet Take 1 tablet by mouth daily.  Marland Kitchen olmesartan (BENICAR) 20 MG tablet TAKE 1 TABLET (20 MG TOTAL) BY MOUTH DAILY.  Marland Kitchen PHENobarbital (LUMINAL) 97.2 MG tablet Take 1 tablet (97.2 mg total) by mouth daily.  Marland Kitchen ULORIC 80 MG TABS TAKE 1 TABLET BY MOUTH EVERY DAY   No facility-administered encounter medications on file as of 12/17/2014.     Review of Systems  Constitutional: Negative.   HENT: Positive for congestion (clear).   Eyes: Negative.   Respiratory: Positive for cough (after breathing tx's).   Cardiovascular: Negative.   Gastrointestinal: Negative.   Endocrine: Negative.   Genitourinary: Negative.   Musculoskeletal: Negative.   Skin: Negative.   Allergic/Immunologic: Negative.   Neurological: Negative.   Hematological: Negative.   Psychiatric/Behavioral: Negative.        Objective:   Physical Exam  Constitutional: She is oriented to person, place, and time. She appears well-developed and well-nourished. No distress.  The patient is alert and with it  HENT:  Head: Normocephalic and atraumatic.  Right Ear: External ear normal.  Left Ear: External ear normal.  Nose: Nose normal.  Mouth/Throat: Oropharynx is clear and moist. No oropharyngeal exudate.  Eyes: Conjunctivae and EOM are normal. Pupils are equal, round, and reactive to light. Right eye exhibits no discharge. Left eye exhibits no discharge. No scleral icterus.  Neck: Normal range of motion. Neck supple. No thyromegaly present.  Cardiovascular: Normal rate, regular rhythm and normal heart sounds.  Exam reveals  no friction rub.   No murmur heard. The heart is regular at 84/m  Pulmonary/Chest: Effort normal. No respiratory distress. She has wheezes. She has no rales. She exhibits no tenderness.  Sparse wheezes on inspiration otherwise lungs are clear  Abdominal: Soft. Bowel sounds are normal. There is no tenderness.  Musculoskeletal: Normal range of motion. She exhibits no edema.  Lymphadenopathy:    She has no  cervical adenopathy.  Neurological: She is alert and oriented to person, place, and time.  Skin: Skin is warm and dry. No rash noted.  Psychiatric: She has a normal mood and affect. Her behavior is normal. Judgment and thought content normal.  Nursing note and vitals reviewed.  BP 138/76 mmHg  Pulse 101  Temp(Src) 97.7 F (36.5 C) (Oral)  Ht '5\' 4"'  (1.626 m)  Wt 266 lb (120.657 kg)  BMI 45.64 kg/m2        Assessment & Plan:  1. Gait instability - Ambulatory referral to Physical Therapy  2. Weakness generalized -This is improving but we will get her to be seen by physical therapy in a week to continue her rehabilitation on an outpatient basis. - Ambulatory referral to Physical Therapy - BMP8+EGFR - CBC with Differential/Platelet - DME Other see comment  3. CAP (community acquired pneumonia) -She is continuing to improve with this. She still has some scattered wheezes. We will continue with her nebulizer treatments at home what she leaves the nursing center 4 times daily. She will return to clinic in about 2 weeks and we will get another chest x-ray and listen to her chest again at that time. - DG Chest 2 View; Future - DME Other see comment  4. Iron deficiency anemia -Currently she is not taking her iron medication and we will wait until we get the CBC back to determine if she needs further iron treatment that she was taking originally.  5. Seizure disorder -She should continue with her seizure medications  6. Cough -Continue with her current cough medicine and albuterol nebulizer treatment - DME Other see comment  7. Chest congestion -Continue with nebulizer treatments - DME Other see comment  8. Dehydration -Continue drinking fluids when she comes home and watch her sodium intake  Meds ordered this encounter  Medications  . albuterol (PROVENTIL) (2.5 MG/3ML) 0.083% nebulizer solution    Sig: Take 3 mLs (2.5 mg total) by nebulization every 6 (six) hours as needed  for wheezing or shortness of breath.    Dispense:  150 mL    Refill:  11   Patient Instructions  Continue neb treatments Continue current medication treatments Hold off for the time being on iron and Benicar Reschedule appointment with nephrologist Return to clinic in a couple weeks for recheck and get a chest x-ray at that time Wait at least one week and then start physical therapy treatments for strengthening purposes Drink plenty of water and fluids and stay well hydrated   Arrie Senate MD

## 2014-12-17 NOTE — Patient Instructions (Signed)
Continue neb treatments Continue current medication treatments Hold off for the time being on iron and Benicar Reschedule appointment with nephrologist Return to clinic in a couple weeks for recheck and get a chest x-ray at that time Wait at least one week and then start physical therapy treatments for strengthening purposes Drink plenty of water and fluids and stay well hydrated

## 2014-12-18 LAB — BMP8+EGFR
BUN / CREAT RATIO: 17 (ref 11–26)
BUN: 21 mg/dL (ref 8–27)
CO2: 31 mmol/L — AB (ref 18–29)
Calcium: 10.8 mg/dL — ABNORMAL HIGH (ref 8.7–10.3)
Chloride: 100 mmol/L (ref 97–108)
Creatinine, Ser: 1.26 mg/dL — ABNORMAL HIGH (ref 0.57–1.00)
GFR calc Af Amer: 44 mL/min/{1.73_m2} — ABNORMAL LOW (ref >59)
GFR, EST NON AFRICAN AMERICAN: 38 mL/min/{1.73_m2} — AB (ref >59)
Glucose: 90 mg/dL (ref 65–99)
Potassium: 4.8 mmol/L (ref 3.5–5.2)
SODIUM: 142 mmol/L (ref 134–144)

## 2014-12-18 LAB — CBC WITH DIFFERENTIAL/PLATELET
BASOS: 1 %
Basophils Absolute: 0.1 10*3/uL (ref 0.0–0.2)
EOS (ABSOLUTE): 0.4 10*3/uL (ref 0.0–0.4)
EOS: 6 %
HEMATOCRIT: 33.8 % — AB (ref 34.0–46.6)
HEMOGLOBIN: 11 g/dL — AB (ref 11.1–15.9)
IMMATURE GRANS (ABS): 0 10*3/uL (ref 0.0–0.1)
IMMATURE GRANULOCYTES: 0 %
LYMPHS: 40 %
Lymphocytes Absolute: 2.9 10*3/uL (ref 0.7–3.1)
MCH: 24.4 pg — ABNORMAL LOW (ref 26.6–33.0)
MCHC: 32.5 g/dL (ref 31.5–35.7)
MCV: 75 fL — AB (ref 79–97)
MONOCYTES: 5 %
MONOS ABS: 0.3 10*3/uL (ref 0.1–0.9)
NEUTROS PCT: 48 %
Neutrophils Absolute: 3.6 10*3/uL (ref 1.4–7.0)
Platelets: 238 10*3/uL (ref 150–379)
RBC: 4.51 x10E6/uL (ref 3.77–5.28)
RDW: 17.9 % — AB (ref 12.3–15.4)
WBC: 7.4 10*3/uL (ref 3.4–10.8)

## 2014-12-29 ENCOUNTER — Ambulatory Visit: Payer: Medicare Other | Admitting: Family Medicine

## 2015-01-03 ENCOUNTER — Ambulatory Visit (INDEPENDENT_AMBULATORY_CARE_PROVIDER_SITE_OTHER): Payer: Medicare Other

## 2015-01-03 ENCOUNTER — Encounter: Payer: Self-pay | Admitting: Family Medicine

## 2015-01-03 ENCOUNTER — Ambulatory Visit (INDEPENDENT_AMBULATORY_CARE_PROVIDER_SITE_OTHER): Payer: Medicare Other | Admitting: Family Medicine

## 2015-01-03 VITALS — BP 136/88 | HR 116 | Temp 98.3°F | Ht 64.0 in | Wt 259.0 lb

## 2015-01-03 DIAGNOSIS — R569 Unspecified convulsions: Secondary | ICD-10-CM | POA: Diagnosis not present

## 2015-01-03 DIAGNOSIS — R531 Weakness: Secondary | ICD-10-CM

## 2015-01-03 DIAGNOSIS — I10 Essential (primary) hypertension: Secondary | ICD-10-CM

## 2015-01-03 DIAGNOSIS — J189 Pneumonia, unspecified organism: Secondary | ICD-10-CM | POA: Diagnosis not present

## 2015-01-03 DIAGNOSIS — R2681 Unsteadiness on feet: Secondary | ICD-10-CM

## 2015-01-03 DIAGNOSIS — R059 Cough, unspecified: Secondary | ICD-10-CM

## 2015-01-03 DIAGNOSIS — R05 Cough: Secondary | ICD-10-CM | POA: Diagnosis not present

## 2015-01-03 MED ORDER — ALBUTEROL SULFATE (2.5 MG/3ML) 0.083% IN NEBU: 2.5000 mg | INHALATION_SOLUTION | Freq: Every evening | RESPIRATORY_TRACT | Status: DC | PRN

## 2015-01-03 MED ORDER — ALBUTEROL SULFATE HFA 108 (90 BASE) MCG/ACT IN AERS: 2.0000 | INHALATION_SPRAY | Freq: Four times a day (QID) | RESPIRATORY_TRACT | Status: DC | PRN

## 2015-01-03 NOTE — Progress Notes (Signed)
Subjective:    Patient ID: [name], female    DOB: 06-21-1923, 81 y.o.   MRN: [id number]  HPI Pt here for follow up on pneumonia and htn. She is accompanied today by her daughter. The patient continues to do better. She still has a cough and congestion and drainage is clear. Her blood pressure is also elevated slightly today. The family is questioning whether the she should continue with her breathing treatments are not. The patient comes to the visit today with her daughter and she is much more perky and has gotten her strength back. She has been using her nebulizer treatments regularly at home. She also brings in blood pressures for review and all of the home readings are excellent.       Patient Active Problem List   Diagnosis Date Noted  . Fluid retention 09/30/2014  . Elevated serum creatinine 09/30/2014  . Essential hypertension, benign 09/30/2014  . Secondary hyperparathyroidism, renal 09/30/2014  . Proteinuria 09/30/2014  . Anemia associated with chronic renal failure 09/30/2014  . Chronic kidney disease, stage III (moderate) 09/30/2014  . Hypertension 08/21/2012  . Hiatal hernia 08/21/2012  . Seizures 08/21/2012   Outpatient Encounter Prescriptions as of 01/03/2015  Medication Sig  . acyclovir (ZOVIRAX) 800 MG tablet TAKE 1 TABLET (800 MG TOTAL) BY MOUTH 5 (FIVE) TIMES DAILY.  Marland Kitchen albuterol (PROVENTIL) (2.5 MG/3ML) 0.083% nebulizer solution Take 3 mLs (2.5 mg total) by nebulization every 6 (six) hours as needed for wheezing or shortness of breath.  Marland Kitchen aspirin 81 MG tablet Take 81 mg by mouth daily.  . ferrous sulfate 325 (65 FE) MG tablet Take 325 mg by mouth daily with breakfast.  . furosemide (LASIX) 80 MG tablet TAKE 1 TABLET IN THE MORNING AND 1 TABLET AT NOON DAILY  . gabapentin (NEURONTIN) 100 MG capsule Take 1 capsule (100 mg total) by mouth 3 (three) times daily.  Marland Kitchen levETIRAcetam (KEPPRA) 1000 MG tablet Take 1 tablet (1,000 mg total) by mouth 2 (two) times  daily.  . metolazone (ZAROXOLYN) 2.5 MG tablet Take 1 tablet by mouth 3 (three) times a week.  . Multiple Vitamins-Minerals (MULTIVITAMIN WITH MINERALS) tablet Take 1 tablet by mouth daily.  Marland Kitchen olmesartan (BENICAR) 20 MG tablet TAKE 1 TABLET (20 MG TOTAL) BY MOUTH DAILY.  Marland Kitchen PHENobarbital (LUMINAL) 97.2 MG tablet Take 1 tablet (97.2 mg total) by mouth daily.  Marland Kitchen ULORIC 80 MG TABS TAKE 1 TABLET BY MOUTH EVERY DAY   No facility-administered encounter medications on file as of 01/03/2015.     Review of Systems  Constitutional: Negative.   HENT: Positive for congestion (slight - clear congestion - agfter treatments).   Eyes: Negative.   Respiratory: Negative.  Negative for cough.   Cardiovascular: Negative.   Gastrointestinal: Negative.   Endocrine: Negative.   Genitourinary: Negative.   Musculoskeletal: Negative.   Skin: Negative.   Allergic/Immunologic: Negative.   Neurological: Negative.   Hematological: Negative.   Psychiatric/Behavioral: Negative.        Objective:   Physical Exam  Constitutional: She is oriented to person, place, and time. She appears well-developed and well-nourished. No distress.  The patient is alert and cooperative  HENT:  Head: Normocephalic and atraumatic.  Right Ear: External ear normal.  Left Ear: External ear normal.  Nose: Nose normal.  Mouth/Throat: Oropharynx is clear and moist.  Eyes: Conjunctivae and EOM are normal. Pupils are equal, round, and reactive to light. Right eye exhibits no discharge. Left eye exhibits no discharge.  No scleral icterus.  Neck: Normal range of motion. Neck supple. No thyromegaly present.  No bruits auscultated  Cardiovascular: Normal rate, regular rhythm and normal heart sounds.  Exam reveals no friction rub.   No murmur heard. The rhythm is minimally irregular at about 84/m rate  Pulmonary/Chest: Effort normal and breath sounds normal. No respiratory distress. She has no wheezes. She has no rales. She exhibits no  tenderness.  The lungs are clear front to back with no wheezes rales  Musculoskeletal: She exhibits no edema or tenderness.  The patient uses a cane for ambulation and some assistance with getting off the table.  Lymphadenopathy:    She has no cervical adenopathy.  Neurological: She is alert and oriented to person, place, and time.  Skin: Skin is warm and dry. No rash noted.  Psychiatric: She has a normal mood and affect. Her behavior is normal. Judgment and thought content normal.  Nursing note and vitals reviewed.  BP 142/91 mmHg  Pulse 116  Temp(Src) 98.3 F (36.8 C) (Oral)  Ht 5\' 4"  (1.626 m)  Wt 259 lb (117.482 kg)  BMI 44.44 kg/m2  Repeat blood pressure 136/88 right arm large cuff sitting [facility] reading (PRIMARY) by  DrMoore-chest x-ray-the pneumonia appears to be completely resolved.                                      Assessment & Plan:  1. Cough -This has improved and she will continue to take her Mucinex and drink plenty of fluids and avoid irritating environments. - DG Chest 2 View; Future  2. Seizures -The patient has had no seizures since her last visit. She continues to take her medication.  3. Weakness generalized -Her weakness has improved.  4. Gait instability -Her gait instability has improved and she still uses a cane.  5. Essential hypertension, benign -Repeat blood pressure was good today and she will continue current treatment  6. CAP (community acquired pneumonia) -The patient is doing better and this appears to be completely resolved based on the chest x-ray reviewed today.  The patient will be given a prescription for an albuterol inhaler and we will also refill the nebulizer solutions to keep on hand at home. She will use the new inhaler as directed.Anoro  No orders of the defined types were placed in this encounter.   Patient Instructions  The patient should finish her albuterol nebulizer by using one neb nightly at bedtime. She will be  changed over to the albuterol inhaler to use every 4-6 hours as needed She will be given an anoro inhaler to use 1 puff once daily--she will call sooner couple weeks if she is doing well we will probably continue with this inhaler and call a prescription in. She will keep the nebulizer on hand at home with solution just in case she develops any further congestion or cough. She will use the Mucinex regularly one by mouth twice daily with a large glass of water   Arrie Senate MD

## 2015-01-03 NOTE — Addendum Note (Signed)
Addended by: Zannie Cove on: 01/03/2015 11:56 AM   Modules accepted: Orders

## 2015-01-03 NOTE — Patient Instructions (Signed)
The patient should finish her albuterol nebulizer by using one neb nightly at bedtime. She will be changed over to the albuterol inhaler to use every 4-6 hours as needed She will be given an anoro inhaler to use 1 puff once daily--she will call sooner couple weeks if she is doing well we will probably continue with this inhaler and call a prescription in. She will keep the nebulizer on hand at home with solution just in case she develops any further congestion or cough. She will use the Mucinex regularly one by mouth twice daily with a large glass of water

## 2015-01-04 ENCOUNTER — Ambulatory Visit: Payer: Medicare Other | Attending: Family Medicine | Admitting: Physical Therapy

## 2015-01-04 DIAGNOSIS — R2681 Unsteadiness on feet: Secondary | ICD-10-CM | POA: Insufficient documentation

## 2015-01-04 NOTE — Therapy (Signed)
[facility] [place], [place], [address] Phone: [phone]   Fax:  [phone]  Physical Therapy Evaluation  Patient Details  Name: [name] MRN: [id number] Date of Birth: 06-02-1923 Referring Provider:  Chipper Herb, MD  Encounter Date: 01/04/2015      PT End of Session - 01/04/15 1346    Visit Number 1   Number of Visits 12   Date for PT Re-Evaluation 02/15/15   PT Start Time 0100   PT Stop Time 0134   PT Time Calculation (min) 34 min   Activity Tolerance Patient tolerated treatment well   Behavior During Therapy [facility] for tasks assessed/performed      Past Medical History  Diagnosis Date  . Hypertension   . Hiatal hernia   . Stroke   . Edema   . Neurogenic claudication   . Seizures   . Internal hemorrhoids   . Eczema   . Obesity   . Seizure disorder   . Headache   . Gout   . Degenerative arthritis   . Chronic low back pain     Past Surgical History  Procedure Laterality Date  . Bilateral knees    . Appendectomy    . Mammoplasty reduction    . Neuroplasty / transposition median nerve at carpal tunnel bilateral    . Tubal ligation    . Cataracts    . Cholecystectomy    . Abdominal hysterectomy    . Appendectomy    . Re-excision of breast lumpectomy      Filed Vitals:   01/04/15 1314  SpO2: 96%    Visit Diagnosis:  Unsteadiness      Subjective Assessment - 01/04/15 1310    Subjective Most all of August I was very sick and had pneumonia.  I had therapy in the hospital.   Patient Stated Goals Get stronger.            [facility] PT Assessment - 01/04/15 0001    Assessment   Medical Diagnosis Gait instability.   Onset Date/Surgical Date --  August 2016.   Precautions   Precautions Fall   Precaution Comments Uses a cane.   Restrictions   Weight Bearing Restrictions No   Balance Screen   Has the patient fallen in the past 6 months No   Has the patient had a decrease in  activity level because of a fear of falling?  No   Is the patient reluctant to leave their home because of a fear of falling?  No   Home Environment   Living Environment Private residence   Prior Function   Level of Independence Requires assistive device for independence   ROM / Strength   AROM / PROM / Strength AROM;Strength   AROM   Overall AROM Comments WFL for bilateral LE's.   Strength   Overall Strength Comments Bilateral LE strength= 4+/5 for major muscle groups.   Special Tests    Special Tests --  Unable to elicit bil LE DTR's.   Ambulation/Gait   Gait Comments Wide BOS gait wiht a straight cane.   Standardized Balance Assessment   Standardized Balance Assessment Berg Balance Test   Berg Balance Test   Sit to Stand Able to stand without using hands and stabilize independently   Standing Unsupported Able to stand safely 2 minutes   Sitting with Back Unsupported but Feet Supported on Floor or Stool Able to sit safely and securely 2 minutes   Stand to  Sit Sits safely with minimal use of hands   Transfers Able to transfer safely, minor use of hands   Standing Unsupported with Eyes Closed Able to stand 10 seconds with supervision   Standing Ubsupported with Feet Together Able to place feet together independently and stand for 1 minute with supervision   From Standing, Reach Forward with Outstretched Arm Can reach forward >12 cm safely (5")   From Standing Position, Pick up Object from Floor Able to pick up shoe, needs supervision   From Standing Position, Turn to Look Behind Over each Shoulder Looks behind one side only/other side shows less weight shift   Turn 360 Degrees Able to turn 360 degrees safely but slowly   Standing Unsupported, Alternately Place Feet on Step/Stool Able to complete >2 steps/needs minimal assist   Standing Unsupported, One Foot in Front Able to take small step independently and hold 30 seconds   Standing on One Leg Able to lift leg independently and hold  equal to or more than 3 seconds   Total Score 42                             PT Short Term Goals - 01-05-2015 1347    PT SHORT TERM GOAL #1   Title Ind with HEP.   Time 6   Period Weeks   Status New   PT SHORT TERM GOAL #2   Title --           PT Long Term Goals - 01-05-2015 1350    PT LONG TERM GOAL #1   Title Increase Berg score to 48-50-56.   Time 6   Period Weeks   Status New               Plan - 01-05-15 1343    Clinical Impression Statement The patint became very ill due to pneumonia in August of 2016 which required an extended hospital stay and rehab.  She is doing much better would would like to continue working on balance and strength.   Pt will benefit from skilled therapeutic intervention in order to improve on the following deficits Decreased activity tolerance;Abnormal gait   Rehab Potential Excellent   PT Frequency 2x / week   PT Duration 6 weeks   PT Treatment/Interventions Therapeutic exercise;Balance training;Neuromuscular re-education;Patient/family education   PT Next Visit Plan Balance and gait program; include LE exercise.          G-Codes - 05-Jan-2015 1339    Functional Assessment Tool Used Clinical Judgement.   Functional Limitation Mobility: Walking and moving around   Mobility: Walking and Moving Around Current Status [phone]) At least 40 percent but less than 60 percent impaired, limited or restricted   Mobility: Walking and Moving Around Goal Status [phone]) At least 1 percent but less than 20 percent impaired, limited or restricted       Problem List Patient Active Problem List   Diagnosis Date Noted  . Fluid retention 09/30/2014  . Elevated serum creatinine 09/30/2014  . Essential hypertension, benign 09/30/2014  . Secondary hyperparathyroidism, renal 09/30/2014  . Proteinuria 09/30/2014  . Anemia associated with chronic renal failure 09/30/2014  . Chronic kidney disease, stage III (moderate) 09/30/2014  .  Hypertension 08/21/2012  . Hiatal hernia 08/21/2012  . Seizures 08/21/2012    APPLEGATE, [place] MPT 01-05-15, 1:53 PM  [facility] [address] [place], [place], [address] Phone: [phone]   Fax:  [phone]     

## 2015-01-06 ENCOUNTER — Ambulatory Visit: Payer: Medicare Other | Admitting: Physical Therapy

## 2015-01-06 ENCOUNTER — Encounter: Payer: Self-pay | Admitting: Physical Therapy

## 2015-01-06 DIAGNOSIS — R2681 Unsteadiness on feet: Secondary | ICD-10-CM | POA: Diagnosis not present

## 2015-01-06 NOTE — Patient Instructions (Signed)
  Bridging   Slowly raise buttocks from floor, keeping stomach tight. Repeat _10___ times per set. Do __2__ sets per session. Do __2__ sessions per day.   Hip Flexion   Hold on to counter or chair then march legs left then right. Hold __2__ seconds. Repeat on other leg. Do __10__ repetitions, _2___ sets.     Half Squat to Chair   Stand with feet shoulder width apart. Push buttocks backward and lower slowly, sitting in chair lightly and returning to standing position. Complete _2_ sets of 10_ repetitions. Perform __2-3_ sessions per day.

## 2015-01-06 NOTE — Therapy (Signed)
[facility] [place], [place], [address] Phone: [phone]   Fax:  [phone]  Physical Therapy Treatment  Patient Details  Name: [name] MRN: [id number] Date of Birth: 18-Apr-1923 Referring Provider:  Chipper Herb, MD  Encounter Date: 01/06/2015      PT End of Session - 01/06/15 0939    Visit Number 2   Number of Visits 12   Date for PT Re-Evaluation 02/15/15   PT Start Time 0904   PT Stop Time 0944   PT Time Calculation (min) 40 min   Activity Tolerance Patient tolerated treatment well   Behavior During Therapy [facility] for tasks assessed/performed      Past Medical History  Diagnosis Date  . Hypertension   . Hiatal hernia   . Stroke   . Edema   . Neurogenic claudication   . Seizures   . Internal hemorrhoids   . Eczema   . Obesity   . Seizure disorder   . Headache   . Gout   . Degenerative arthritis   . Chronic low back pain     Past Surgical History  Procedure Laterality Date  . Bilateral knees    . Appendectomy    . Mammoplasty reduction    . Neuroplasty / transposition median nerve at carpal tunnel bilateral    . Tubal ligation    . Cataracts    . Cholecystectomy    . Abdominal hysterectomy    . Appendectomy    . Re-excision of breast lumpectomy      There were no vitals filed for this visit.  Visit Diagnosis:  Unsteadiness      Subjective Assessment - 01/06/15 0908    Subjective feeling good today per patient   Patient Stated Goals Get stronger.   Currently in Pain? No/denies                         [facility] Adult PT Treatment/Exercise - 01/06/15 0001    Exercises   Exercises Lumbar;Knee/Hip   Lumbar Exercises: Seated   Sit to Stand 10 reps   Lumbar Exercises: Supine   Bridge 10 reps   Knee/Hip Exercises: Aerobic   Nustep x69mn UE/LE monitored   Knee/Hip Exercises: Standing   Forward Step Up Both;2 sets;10 reps;Hand Hold: 1;Step Height: 6"             Balance Exercises - 01/06/15 0922    Balance Exercises: Standing   Standing Eyes Opened Wide (BOA);Foam/compliant surface  airex 3105m    Rockerboard Anterior/posterior;UE support;Intermittent UE support  49m16m  Step Ups Forward;6 inch;UE support 1  toe taps for weight shifting / balance 2x10   Marching Limitations x5           PT Education - 01/06/15 0938    Education provided Yes   Education Details HEP   Person(s) Educated Patient   Methods Explanation;Demonstration;Handout   Comprehension Verbalized understanding;Returned demonstration          PT Short Term Goals - 01/06/15 0943    PT SHORT TERM GOAL #1   Title Ind with HEP.   Time 6   Period Weeks   Status Achieved           PT Long Term Goals - 01/04/15 1350    PT LONG TERM GOAL #1   Title Increase Berg score to 48-50-56.   Time 6   Period Weeks   Status New  Plan - 01/06/15 0944    Clinical Impression Statement Patient progressing with all strength and balance activities. Patient was given HEP today. Patient was able to tolerate all exercises with no difficulty. Patient met STG #1 others ongoing due to balance deficits   Pt will benefit from skilled therapeutic intervention in order to improve on the following deficits Decreased activity tolerance;Abnormal gait   Rehab Potential Excellent   PT Frequency 2x / week   PT Duration 6 weeks   PT Treatment/Interventions Therapeutic exercise;Balance training;Neuromuscular re-education;Patient/family education   PT Next Visit Plan Balance and gait program; include LE exercise.   Consulted and Agree with Plan of Care Patient        Problem List Patient Active Problem List   Diagnosis Date Noted  . Fluid retention 09/30/2014  . Elevated serum creatinine 09/30/2014  . Essential hypertension, benign 09/30/2014  . Secondary hyperparathyroidism, renal 09/30/2014  . Proteinuria 09/30/2014  . Anemia associated with chronic renal  failure 09/30/2014  . Chronic kidney disease, stage III (moderate) 09/30/2014  . Hypertension 08/21/2012  . Hiatal hernia 08/21/2012  . Seizures 08/21/2012    Phillips Climes, PTA 01/06/2015, 9:46 AM  [facility] [address] [place], [place], [address] Phone: [phone]   Fax:  [phone]

## 2015-01-10 ENCOUNTER — Ambulatory Visit: Payer: Medicare Other | Admitting: Physical Therapy

## 2015-01-10 DIAGNOSIS — R2681 Unsteadiness on feet: Secondary | ICD-10-CM | POA: Diagnosis not present

## 2015-01-10 NOTE — Therapy (Signed)
[facility] [place], [place], [address] Phone: [phone]   Fax:  [phone]  Physical Therapy Treatment  Patient Details  Name: SANTRICE GRIGNON MRN: [id number] Date of Birth: [birth year] Referring Provider:  Chipper Herb, MD  Encounter Date: 01/10/2015      PT End of Session - 01/10/15 1516    Visit Number 3   Number of Visits 12   Date for PT Re-Evaluation 02/15/15   PT Start Time 0230   PT Stop Time 0306   PT Time Calculation (min) 36 min   Activity Tolerance Patient tolerated treatment well   Behavior During Therapy [facility] for tasks assessed/performed      Past Medical History  Diagnosis Date  . Hypertension   . Hiatal hernia   . Stroke   . Edema   . Neurogenic claudication   . Seizures   . Internal hemorrhoids   . Eczema   . Obesity   . Seizure disorder   . Headache   . Gout   . Degenerative arthritis   . Chronic low back pain     Past Surgical History  Procedure Laterality Date  . Bilateral knees    . Appendectomy    . Mammoplasty reduction    . Neuroplasty / transposition median nerve at carpal tunnel bilateral    . Tubal ligation    . Cataracts    . Cholecystectomy    . Abdominal hysterectomy    . Appendectomy    . Re-excision of breast lumpectomy      There were no vitals filed for this visit.  Visit Diagnosis:  Unsteadiness      Subjective Assessment - 01/10/15 1512    Subjective Feeling good.                                   PT Short Term Goals - 01/06/15 [id number]    PT SHORT TERM GOAL #1   Title Ind with HEP.   Time 6   Period Weeks   Status Achieved           PT Long Term Goals - 01/04/15 1350    PT LONG TERM GOAL #1   Title Increase Berg score to 48-50-56.   Time 6   Period Weeks   Status New               Problem List Patient Active Problem List   Diagnosis Date Noted  . Fluid retention 09/30/2014  . Elevated serum  creatinine 09/30/2014  . Essential hypertension, benign 09/30/2014  . Secondary hyperparathyroidism, renal 09/30/2014  . Proteinuria 09/30/2014  . Anemia associated with chronic renal failure 09/30/2014  . Chronic kidney disease, stage III (moderate) 09/30/2014  . Hypertension 08/21/2012  . Hiatal hernia 08/21/2012  . Seizures 08/21/2012   Treatment:  Nustep level 4 x 20 minutes; XTS orange resisted walking forward and backward 4 minutes; Rockerboard in parallel bars x 2 minutes; Airex standing x 2 minutes and 6 inches step-up 2 x 15.  Akeria Hedstrom, [place] MPT 01/10/2015, 3:16 PM  [facility] [address] [place], [place], [address] Phone: [phone]   Fax:  [phone]

## 2015-01-13 ENCOUNTER — Ambulatory Visit: Payer: Medicare Other | Admitting: Physical Therapy

## 2015-01-13 ENCOUNTER — Encounter: Payer: Self-pay | Admitting: Physical Therapy

## 2015-01-13 DIAGNOSIS — R2681 Unsteadiness on feet: Secondary | ICD-10-CM

## 2015-01-13 NOTE — Therapy (Signed)
[facility] [place], [place], [address] Phone: [phone]   Fax:  [phone]  Physical Therapy Treatment  Patient Details  Name: [name] MRN: [id number] Date of Birth: 1923-08-03 Referring Provider:  Chipper Herb, MD  Encounter Date: 01/13/2015      PT End of Session - 01/13/15 1310    Visit Number 4   Number of Visits 12   Date for PT Re-Evaluation 02/15/15   PT Start Time 1307   PT Stop Time 1342   PT Time Calculation (min) 35 min   Activity Tolerance Patient tolerated treatment well   Behavior During Therapy [facility] for tasks assessed/performed      Past Medical History  Diagnosis Date  . Hypertension   . Hiatal hernia   . Stroke   . Edema   . Neurogenic claudication   . Seizures   . Internal hemorrhoids   . Eczema   . Obesity   . Seizure disorder   . Headache   . Gout   . Degenerative arthritis   . Chronic low back pain     Past Surgical History  Procedure Laterality Date  . Bilateral knees    . Appendectomy    . Mammoplasty reduction    . Neuroplasty / transposition median nerve at carpal tunnel bilateral    . Tubal ligation    . Cataracts    . Cholecystectomy    . Abdominal hysterectomy    . Appendectomy    . Re-excision of breast lumpectomy      There were no vitals filed for this visit.  Visit Diagnosis:  Unsteadiness      Subjective Assessment - 01/13/15 1308    Subjective Reports not feeling too well today and her knees have been stiff. Had a sharp pain across her head yesterday and the day before and wants BP taken before she leaves today.   Patient Stated Goals Get stronger.   Currently in Pain? No/denies            [facility] PT Assessment - 01/13/15 0001    Assessment   Medical Diagnosis Gait instability.   Precautions   Precautions Fall   Precaution Comments Uses a cane.                     [facility] Adult PT Treatment/Exercise - 01/13/15 0001    Lumbar Exercises: Aerobic   Stationary Bike NuStep L5 x15 min, seat 14   Lumbar Exercises: Seated   Sit to Stand 20 reps             Balance Exercises - 01/13/15 1339    Balance Exercises: Standing   Standing Eyes Opened Wide (BOA);Foam/compliant surface  x2 min   Standing Eyes Closed Wide (BOA);Foam/compliant surface;Other reps (comment)  x2 min   Rockerboard Anterior/posterior;EO;UE support  3 min   Step Ups Forward;UE support 2  x20 reps   Marching Limitations x30 reps on airex             PT Short Term Goals - 01/06/15 [id number]    PT SHORT TERM GOAL #1   Title Ind with HEP.   Time 6   Period Weeks   Status Achieved           PT Long Term Goals - 01/04/15 1350    PT LONG TERM GOAL #1   Title Increase Berg score to 48-50-56.   Time 6   Period Weeks   Status New  Plan - 01/13/15 1401    Clinical Impression Statement Patient tolerated today's treatment fairly well today although she had reported some stiffness in BLEs today. Completed all exercises well today and as directed with minimal multimodal cueing and required supervision and close supervision with all balance exercises. Vitals taken following today's treatment were measured at 124/78, 87 bpm, 98% O2 sat.   Pt will benefit from skilled therapeutic intervention in order to improve on the following deficits Decreased activity tolerance;Abnormal gait   Rehab Potential Excellent   PT Frequency 2x / week   PT Duration 6 weeks   PT Treatment/Interventions Therapeutic exercise;Balance training;Neuromuscular re-education;Patient/family education   PT Next Visit Plan Balance and gait program; include LE exercise.   Consulted and Agree with Plan of Care Patient        Problem List Patient Active Problem List   Diagnosis Date Noted  . Fluid retention 09/30/2014  . Elevated serum creatinine 09/30/2014  . Essential hypertension, benign 09/30/2014  . Secondary hyperparathyroidism,  renal 09/30/2014  . Proteinuria 09/30/2014  . Anemia associated with chronic renal failure 09/30/2014  . Chronic kidney disease, stage III (moderate) 09/30/2014  . Hypertension 08/21/2012  . Hiatal hernia 08/21/2012  . Seizures 08/21/2012    Wynelle Fanny, PTA 01/13/2015, 2:08 PM  [facility] [address] [place], [place], [address] Phone: [phone]   Fax:  [phone]

## 2015-01-18 ENCOUNTER — Encounter: Payer: Self-pay | Admitting: *Deleted

## 2015-01-18 ENCOUNTER — Ambulatory Visit: Payer: Medicare Other | Attending: Family Medicine | Admitting: *Deleted

## 2015-01-18 DIAGNOSIS — R2681 Unsteadiness on feet: Secondary | ICD-10-CM | POA: Insufficient documentation

## 2015-01-18 NOTE — Therapy (Signed)
[facility] [place], [place], [address] Phone: [phone]   Fax:  [phone]  Physical Therapy Treatment  Patient Details  Name: [name] MRN: [id number] Date of Birth: 1923-12-11 Referring Provider:  Chipper Herb, MD  Encounter Date: 01/18/2015      PT End of Session - 01/18/15 1527    Visit Number 5   Number of Visits 12   Date for PT Re-Evaluation 02/15/15   PT Start Time [id number]   PT Stop Time 1605   PT Time Calculation (min) 50 min   Activity Tolerance Patient tolerated treatment well   Behavior During Therapy [facility] for tasks assessed/performed      Past Medical History  Diagnosis Date  . Hypertension   . Hiatal hernia   . Stroke ([place])   . Edema   . Neurogenic claudication   . Seizures ([place])   . Internal hemorrhoids   . Eczema   . Obesity   . Seizure disorder ([place])   . Headache   . Gout   . Degenerative arthritis   . Chronic low back pain     Past Surgical History  Procedure Laterality Date  . Bilateral knees    . Appendectomy    . Mammoplasty reduction    . Neuroplasty / transposition median nerve at carpal tunnel bilateral    . Tubal ligation    . Cataracts    . Cholecystectomy    . Abdominal hysterectomy    . Appendectomy    . Re-excision of breast lumpectomy      There were no vitals filed for this visit.  Visit Diagnosis:  Unsteadiness      Subjective Assessment - 01/18/15 1528    Subjective Both shldrs hurt today.Balance is doing better. LT is swollen   Patient Stated Goals Get stronger.                         [facility] Adult PT Treatment/Exercise - 01/18/15 0001    Lumbar Exercises: Aerobic   Stationary Bike --   Lumbar Exercises: Seated   Sit to Stand 20 reps   Knee/Hip Exercises: Aerobic   Nustep x30min UE/LE monitored for tolerance and progression   Knee/Hip Exercises: Seated   Long Arc Quad Strengthening;Both;2 sets;20 reps              Balance Exercises - 01/18/15 1721    Balance Exercises: Standing   Standing Eyes Opened Wide (BOA);Foam/compliant surface  x2 min   Standing Eyes Closed Wide (BOA);Foam/compliant surface;Other reps (comment)  x2 min   Rockerboard Anterior/posterior;EO;UE support  3 min   Step Ups Forward;UE support 2  x20 reps   Marching Limitations x30 reps on airex                                                                                                    Standing on airex. Reaching diagonals both UEs 3x10 each side, mini-squats 2x10        PT Short Term Goals - 01/06/15 [id number]  PT SHORT TERM GOAL #1   Title Ind with HEP.   Time 6   Period Weeks   Status Achieved           PT Long Term Goals - 01/04/15 1350    PT LONG TERM GOAL #1   Title Increase Berg score to 48-50-56.   Time 6   Period Weeks   Status New               Plan - 01/18/15 1732    Clinical Impression Statement Pt did well today and was able to perform exs and balance act.'s. She continues to improve with balance and strength. She had more endurance today and was able to perform more act.'s today.    Pt will benefit from skilled therapeutic intervention in order to improve on the following deficits Decreased activity tolerance;Abnormal gait   Rehab Potential Excellent   PT Frequency 2x / week   PT Duration 6 weeks   PT Treatment/Interventions Therapeutic exercise;Balance training;Neuromuscular re-education;Patient/family education   PT Next Visit Plan Balance and gait program; include LE exercise.   Consulted and Agree with Plan of Care Patient        Problem List Patient Active Problem List   Diagnosis Date Noted  . Fluid retention 09/30/2014  . Elevated serum creatinine 09/30/2014  . Essential hypertension, benign 09/30/2014  . Secondary hyperparathyroidism, renal ([place]) 09/30/2014  . Proteinuria 09/30/2014  . Anemia associated with chronic renal failure 09/30/2014  . Chronic kidney  disease, stage III (moderate) 09/30/2014  . Hypertension 08/21/2012  . Hiatal hernia 08/21/2012  . Seizures ([place]) 08/21/2012    RAMSEUR,CHRIS, PTA 01/18/2015, 5:51 PM  [facility] [address] [place], [place], [address] Phone: [phone]   Fax:  [phone]

## 2015-01-20 ENCOUNTER — Encounter: Payer: Self-pay | Admitting: Physical Therapy

## 2015-01-20 ENCOUNTER — Ambulatory Visit: Payer: Medicare Other | Admitting: Physical Therapy

## 2015-01-20 DIAGNOSIS — R2681 Unsteadiness on feet: Secondary | ICD-10-CM

## 2015-01-20 NOTE — Therapy (Signed)
[facility] [place], [place], [address] Phone: [phone]   Fax:  [phone]  Physical Therapy Treatment  Patient Details  Name: [name] MRN: [id number] Date of Birth: 10-25-1923 Referring Provider:  Chipper Herb, MD  Encounter Date: 01/20/2015      PT End of Session - 01/20/15 1317    Visit Number 6   Number of Visits 12   Date for PT Re-Evaluation 02/15/15   PT Start Time [id number]   PT Stop Time 1339   PT Time Calculation (min) 26 min   Activity Tolerance Patient tolerated treatment well   Behavior During Therapy [facility] for tasks assessed/performed      Past Medical History  Diagnosis Date  . Hypertension   . Hiatal hernia   . Stroke ([place])   . Edema   . Neurogenic claudication   . Seizures ([place])   . Internal hemorrhoids   . Eczema   . Obesity   . Seizure disorder ([place])   . Headache   . Gout   . Degenerative arthritis   . Chronic low back pain     Past Surgical History  Procedure Laterality Date  . Bilateral knees    . Appendectomy    . Mammoplasty reduction    . Neuroplasty / transposition median nerve at carpal tunnel bilateral    . Tubal ligation    . Cataracts    . Cholecystectomy    . Abdominal hysterectomy    . Appendectomy    . Re-excision of breast lumpectomy      There were no vitals filed for this visit.  Visit Diagnosis:  Unsteadiness      Subjective Assessment - 01/20/15 1315    Subjective Reports arthritis is trying to get the worst of her today mostly in shoulders and neck.   Patient Stated Goals Get stronger.   Currently in Pain? No/denies            [facility] PT Assessment - 01/20/15 0001    Assessment   Medical Diagnosis Gait instability.   Precautions   Precautions Fall   Precaution Comments Uses a cane.                     [facility] Adult PT Treatment/Exercise - 01/20/15 0001    Lumbar Exercises: Aerobic   Stationary Bike NuStep L5 x15 min    Knee/Hip Exercises: Seated   Long Arc Quad Strengthening;Both;3 sets;10 reps   Long Arc Quad Weight 3 lbs.             Balance Exercises - 01/20/15 1332    Balance Exercises: Standing   Standing Eyes Closed Foam/compliant surface  x2 min   Tandem Stance Eyes open;Intermittent upper extremity support;Other reps (comment)  x1 min   Rockerboard Anterior/posterior;EO;UE support  x3 min   Marching Limitations --             PT Short Term Goals - 01/06/15 0943    PT SHORT TERM GOAL #1   Title Ind with HEP.   Time 6   Period Weeks   Status Achieved           PT Long Term Goals - 01/04/15 1350    PT LONG TERM GOAL #1   Title Increase Berg score to 48-50-56.   Time 6   Period Weeks   Status New               Plan - 01/20/15 1357  Clinical Impression Statement Patient tolerated today's treatment well with all exercises and balance activities. Continues to show improvement regarding strength and balance. BP taken following today's treatment was 125/70 with 82 bpm.   Pt will benefit from skilled therapeutic intervention in order to improve on the following deficits Decreased activity tolerance;Abnormal gait   Rehab Potential Excellent   PT Frequency 2x / week   PT Duration 6 weeks   PT Treatment/Interventions Therapeutic exercise;Balance training;Neuromuscular re-education;Patient/family education   PT Next Visit Plan Balance and gait program; include LE exercise.   Consulted and Agree with Plan of Care Patient        Problem List Patient Active Problem List   Diagnosis Date Noted  . Fluid retention 09/30/2014  . Elevated serum creatinine 09/30/2014  . Essential hypertension, benign 09/30/2014  . Secondary hyperparathyroidism, renal ([place]) 09/30/2014  . Proteinuria 09/30/2014  . Anemia associated with chronic renal failure 09/30/2014  . Chronic kidney disease, stage III (moderate) 09/30/2014  . Hypertension 08/21/2012  . Hiatal hernia 08/21/2012   . Seizures ([place]) 08/21/2012    Wynelle Fanny, PTA 01/20/2015, 2:00 PM  [facility] [address] [place], [place], [address] Phone: [phone]   Fax:  [phone]

## 2015-01-24 ENCOUNTER — Encounter: Payer: Self-pay | Admitting: Physical Therapy

## 2015-01-24 ENCOUNTER — Ambulatory Visit: Payer: Medicare Other | Admitting: Physical Therapy

## 2015-01-24 DIAGNOSIS — R2681 Unsteadiness on feet: Secondary | ICD-10-CM | POA: Diagnosis not present

## 2015-01-24 NOTE — Therapy (Signed)
[facility] [place], [place], [address] Phone: [phone]   Fax:  [phone]  Physical Therapy Treatment  Patient Details  Name: [name] MRN: [id number] Date of Birth: May 17, 1923 Referring Provider:  Chipper Herb, MD  Encounter Date: 01/24/2015      PT End of Session - 01/24/15 1020    Visit Number 7   Number of Visits 12   Date for PT Re-Evaluation 02/15/15   PT Start Time 0949   PT Stop Time 1020   PT Time Calculation (min) 31 min   Activity Tolerance Patient tolerated treatment well;Patient limited by fatigue;Patient limited by pain   Behavior During Therapy [facility] for tasks assessed/performed      Past Medical History  Diagnosis Date  . Hypertension   . Hiatal hernia   . Stroke ([place])   . Edema   . Neurogenic claudication   . Seizures ([place])   . Internal hemorrhoids   . Eczema   . Obesity   . Seizure disorder ([place])   . Headache   . Gout   . Degenerative arthritis   . Chronic low back pain     Past Surgical History  Procedure Laterality Date  . Bilateral knees    . Appendectomy    . Mammoplasty reduction    . Neuroplasty / transposition median nerve at carpal tunnel bilateral    . Tubal ligation    . Cataracts    . Cholecystectomy    . Abdominal hysterectomy    . Appendectomy    . Re-excision of breast lumpectomy      There were no vitals filed for this visit.  Visit Diagnosis:  Unsteadiness      Subjective Assessment - 01/24/15 0954    Subjective stiff and sore back today per patient   Patient Stated Goals Get stronger.   Currently in Pain? No/denies            [facility] PT Assessment - 01/24/15 0001    Berg Balance Test   Sit to Stand Able to stand without using hands and stabilize independently   Standing Unsupported Able to stand safely 2 minutes   Sitting with Back Unsupported but Feet Supported on Floor or Stool Able to sit safely and securely 2 minutes   Stand to Sit  Sits safely with minimal use of hands   Transfers Able to transfer safely, minor use of hands   Standing Unsupported with Eyes Closed Able to stand 10 seconds safely   Standing Ubsupported with Feet Together Able to place feet together independently and stand 1 minute safely   From Standing, Reach Forward with Outstretched Arm Can reach confidently >25 cm (10")   From Standing Position, Pick up Object from Floor Able to pick up shoe safely and easily   From Standing Position, Turn to Look Behind Over each Shoulder Looks behind one side only/other side shows less weight shift   Turn 360 Degrees Able to turn 360 degrees safely but slowly   Standing Unsupported, Alternately Place Feet on Step/Stool Able to complete 4 steps without aid or supervision   Standing Unsupported, One Foot in Front Able to take small step independently and hold 30 seconds   Standing on One Leg Able to lift leg independently and hold equal to or more than 3 seconds   Total Score 47                     [facility] Adult PT  Treatment/Exercise - 01/24/15 0001    Lumbar Exercises: Aerobic   Stationary Bike NuStep L5 x15 min, monitored for progression   Knee/Hip Exercises: Seated   Long Arc Quad Strengthening;Both;3 sets;10 reps   Long Arc Quad Weight 3 lbs.   Other Seated Knee/Hip Exercises marching 3x10   Marching Weights 3 lbs.                  PT Short Term Goals - 01/06/15 [id number]    PT SHORT TERM GOAL #1   Title Ind with HEP.   Time 6   Period Weeks   Status Achieved           PT Long Term Goals - 01/24/15 1024    PT LONG TERM GOAL #1   Title Increase Berg score to 48-50-56.   Time 6   Period Weeks   Status On-going  47/56 (01/24/15)               Plan - 01/24/15 1025    Clinical Impression Statement Patient tolerated treatment well, yet had to end early due to pain in back and overall fatigue. BP after ex's 132/74 then BP after rest  121/ 69 Patient was able to tolerate  seated exercise and some standing balance exercises for BERG test. Patient improved BERG score yet unable to meet goal today due to balance deficits.   Pt will benefit from skilled therapeutic intervention in order to improve on the following deficits Decreased activity tolerance;Abnormal gait   Rehab Potential Excellent   PT Frequency 2x / week   PT Duration 6 weeks   PT Treatment/Interventions Therapeutic exercise;Balance training;Neuromuscular re-education;Patient/family education   PT Next Visit Plan Balance and gait program; include LE exercise.   Consulted and Agree with Plan of Care Patient;Family member/caregiver        Problem List Patient Active Problem List   Diagnosis Date Noted  . Fluid retention 09/30/2014  . Elevated serum creatinine 09/30/2014  . Essential hypertension, benign 09/30/2014  . Secondary hyperparathyroidism, renal ([place]) 09/30/2014  . Proteinuria 09/30/2014  . Anemia associated with chronic renal failure 09/30/2014  . Chronic kidney disease, stage III (moderate) 09/30/2014  . Hypertension 08/21/2012  . Hiatal hernia 08/21/2012  . Seizures ([place]) 08/21/2012    Corri Delapaz P, PTA 01/24/2015, 10:34 AM  [facility] [address] [place], [place], [address] Phone: [phone]   Fax:  [phone]

## 2015-01-26 ENCOUNTER — Encounter: Payer: Self-pay | Admitting: Physical Therapy

## 2015-01-26 ENCOUNTER — Ambulatory Visit: Payer: Medicare Other | Admitting: Physical Therapy

## 2015-01-26 DIAGNOSIS — R2681 Unsteadiness on feet: Secondary | ICD-10-CM

## 2015-01-26 NOTE — Therapy (Signed)
[facility] [place], [place], [address] Phone: [phone]   Fax:  [phone]  Physical Therapy Treatment  Patient Details  Name: [name] MRN: [id number] Date of Birth: December 17, 1923 Referring Provider:  Chipper Herb, MD  Encounter Date: 01/26/2015      PT End of Session - 01/26/15 1018    Visit Number 8   Number of Visits 12   Date for PT Re-Evaluation 02/15/15   PT Start Time 0952   PT Stop Time 1022   PT Time Calculation (min) 30 min   Activity Tolerance Patient limited by pain;Patient tolerated treatment well   Behavior During Therapy [facility] for tasks assessed/performed      Past Medical History  Diagnosis Date  . Hypertension   . Hiatal hernia   . Stroke ([place])   . Edema   . Neurogenic claudication   . Seizures ([place])   . Internal hemorrhoids   . Eczema   . Obesity   . Seizure disorder ([place])   . Headache   . Gout   . Degenerative arthritis   . Chronic low back pain     Past Surgical History  Procedure Laterality Date  . Bilateral knees    . Appendectomy    . Mammoplasty reduction    . Neuroplasty / transposition median nerve at carpal tunnel bilateral    . Tubal ligation    . Cataracts    . Cholecystectomy    . Abdominal hysterectomy    . Appendectomy    . Re-excision of breast lumpectomy      There were no vitals filed for this visit.  Visit Diagnosis:  Unsteadiness      Subjective Assessment - 01/26/15 0959    Subjective back is very sore today and limiting her from a lot of standing activities.   Patient Stated Goals Get stronger.   Currently in Pain? Other (Comment)  back pain in low back limiting her from standing activities 6-8/10                         [facility] Adult PT Treatment/Exercise - 01/26/15 0001    Lumbar Exercises: Aerobic   Stationary Bike NuStep L5 x17 min, monitored for progression and cues for posture   Knee/Hip Exercises: Seated   Long Arc  Quad Strengthening;Both;3 sets;10 reps   Long Arc Quad Weight 3 lbs.   Other Seated Knee/Hip Exercises marching 2x10   Marching Weights 3 lbs.                  PT Short Term Goals - 01/06/15 [id number]    PT SHORT TERM GOAL #1   Title Ind with HEP.   Time 6   Period Weeks   Status Achieved           PT Long Term Goals - 01/24/15 1024    PT LONG TERM GOAL #1   Title Increase Berg score to 48-50-56.   Time 6   Period Weeks   Status On-going  47/56 (01/24/15)               Plan - 01/26/15 1022    Clinical Impression Statement Patient tolerated seated exercises well today, although unable to perform standing activities due to high pain level in low back. Patient is to call MD for back pain. Patient has improved overall yet continues to be limited this week from back pain. Patient unable to meet any current  goals due to balace deficit.   Pt will benefit from skilled therapeutic intervention in order to improve on the following deficits Decreased activity tolerance;Abnormal gait   Rehab Potential Excellent   PT Frequency 2x / week   PT Duration 6 weeks   PT Treatment/Interventions Therapeutic exercise;Balance training;Neuromuscular re-education;Patient/family education   PT Next Visit Plan Balance and gait program; include LE exercise.   Consulted and Agree with Plan of Care Patient        Problem List Patient Active Problem List   Diagnosis Date Noted  . Fluid retention 09/30/2014  . Elevated serum creatinine 09/30/2014  . Essential hypertension, benign 09/30/2014  . Secondary hyperparathyroidism, renal ([place]) 09/30/2014  . Proteinuria 09/30/2014  . Anemia associated with chronic renal failure 09/30/2014  . Chronic kidney disease, stage III (moderate) 09/30/2014  . Hypertension 08/21/2012  . Hiatal hernia 08/21/2012  . Seizures ([place]) 08/21/2012    Kori Goins P, PTA 01/26/2015, 10:25 AM  [facility] [address] [place], [place], [address] Phone: [phone]   Fax:  [phone]

## 2015-02-01 ENCOUNTER — Encounter: Payer: Self-pay | Admitting: Physical Therapy

## 2015-02-01 ENCOUNTER — Ambulatory Visit: Payer: Medicare Other | Admitting: Physical Therapy

## 2015-02-01 DIAGNOSIS — R2681 Unsteadiness on feet: Secondary | ICD-10-CM

## 2015-02-01 NOTE — Therapy (Signed)
[facility] [place], [place], [address] Phone: [phone]   Fax:  [phone]  Physical Therapy Treatment  Patient Details  Name: [name] MRN: [id number] Date of Birth: 12-05-1923 No Data Recorded  Encounter Date: 02/01/2015      PT End of Session - 02/01/15 1023    Visit Number 9   Number of Visits 12   Date for PT Re-Evaluation 02/15/15   PT Start Time 0954   PT Stop Time 1023   PT Time Calculation (min) 29 min      Past Medical History  Diagnosis Date  . Hypertension   . Hiatal hernia   . Stroke ([place])   . Edema   . Neurogenic claudication   . Seizures ([place])   . Internal hemorrhoids   . Eczema   . Obesity   . Seizure disorder ([place])   . Headache   . Gout   . Degenerative arthritis   . Chronic low back pain     Past Surgical History  Procedure Laterality Date  . Bilateral knees    . Appendectomy    . Mammoplasty reduction    . Neuroplasty / transposition median nerve at carpal tunnel bilateral    . Tubal ligation    . Cataracts    . Cholecystectomy    . Abdominal hysterectomy    . Appendectomy    . Re-excision of breast lumpectomy      There were no vitals filed for this visit.  Visit Diagnosis:  Unsteadiness      Subjective Assessment - 02/01/15 0956    Subjective back seems to be some better today   Patient Stated Goals Get stronger.   Currently in Pain? No/denies                         [facility] Adult PT Treatment/Exercise - 02/01/15 0001    Lumbar Exercises: Aerobic   Stationary Bike Nustep L5 x96min monitored progression   Knee/Hip Exercises: Seated   Long Arc Quad Strengthening;Both;3 sets;10 reps   Long Arc Quad Weight 3 lbs.   Other Seated Knee/Hip Exercises marching 3x10   Marching Weights 3 lbs.   Abd/Adduction Limitations seated hip abd with red-t-band 4x10                  PT Short Term Goals - 01/06/15 0943    PT SHORT TERM GOAL #1   Title Ind with HEP.   Time 6   Period Weeks   Status Achieved           PT Long Term Goals - 01/24/15 1024    PT LONG TERM GOAL #1   Title Increase Berg score to 48-50-56.   Time 6   Period Weeks   Status On-going  47/56 (01/24/15)               Plan - 02/01/15 1023    Clinical Impression Statement Patient progressing slowly today. patient only wanted to perform seated exercises today and had c/o foot pain from a pedicure. patient was able to perform LE exercises with no difficulty. unable to meet any current goals due to balance deficit and unable to perform balance activitied due to patinet refusing standing balance at this time.   Pt will benefit from skilled therapeutic intervention in order to improve on the following deficits Decreased activity tolerance;Abnormal gait   Rehab Potential Excellent   PT Frequency 2x / week  PT Duration 6 weeks   PT Treatment/Interventions Therapeutic exercise;Balance training;Neuromuscular re-education;Patient/family education   PT Next Visit Plan Balance and gait program; include LE exercise.   Consulted and Agree with Plan of Care Patient        Problem List Patient Active Problem List   Diagnosis Date Noted  . Fluid retention 09/30/2014  . Elevated serum creatinine 09/30/2014  . Essential hypertension, benign 09/30/2014  . Secondary hyperparathyroidism, renal ([place]) 09/30/2014  . Proteinuria 09/30/2014  . Anemia associated with chronic renal failure 09/30/2014  . Chronic kidney disease, stage III (moderate) 09/30/2014  . Hypertension 08/21/2012  . Hiatal hernia 08/21/2012  . Seizures ([place]) 08/21/2012    [name], PTA 02/01/2015, 10:30 AM  [facility] [place], [place], [address] Phone: [phone]   Fax:  [phone]  Name: [name] MRN: [id number] Date of Birth: 1924/02/10

## 2015-02-02 ENCOUNTER — Ambulatory Visit: Payer: Medicare Other | Admitting: Family Medicine

## 2015-02-03 ENCOUNTER — Encounter: Payer: Self-pay | Admitting: Family Medicine

## 2015-02-03 ENCOUNTER — Ambulatory Visit: Payer: Medicare Other | Admitting: Physical Therapy

## 2015-02-03 DIAGNOSIS — R2681 Unsteadiness on feet: Secondary | ICD-10-CM | POA: Diagnosis not present

## 2015-02-03 NOTE — Therapy (Signed)
[facility] [place], [place], [address] Phone: [phone]   Fax:  [phone]  Physical Therapy Treatment  Patient Details  Name: [name] MRN: [id number] Date of Birth: Jun 05, 1923 No Data Recorded  Encounter Date: 02/03/2015      PT End of Session - 02/03/15 0959    Visit Number 10   Number of Visits 12   Date for PT Re-Evaluation 02/15/15   PT Start Time 0955   PT Stop Time 1030   PT Time Calculation (min) 35 min   Activity Tolerance Patient limited by fatigue   Behavior During Therapy [facility] for tasks assessed/performed      Past Medical History  Diagnosis Date  . Hypertension   . Hiatal hernia   . Stroke ([place])   . Edema   . Neurogenic claudication   . Seizures ([place])   . Internal hemorrhoids   . Eczema   . Obesity   . Seizure disorder ([place])   . Headache   . Gout   . Degenerative arthritis   . Chronic low back pain     Past Surgical History  Procedure Laterality Date  . Bilateral knees    . Appendectomy    . Mammoplasty reduction    . Neuroplasty / transposition median nerve at carpal tunnel bilateral    . Tubal ligation    . Cataracts    . Cholecystectomy    . Abdominal hysterectomy    . Appendectomy    . Re-excision of breast lumpectomy      There were no vitals filed for this visit.  Visit Diagnosis:  Unsteadiness      Subjective Assessment - 02/03/15 1002    Subjective Patient 10 min late. Patient reports she had a bad night and her foot stil hurts. She is going to doctor for foot after PT. She reports improvement overall. She is able to do her washing and work a little around the house which she was unable to do before. She states she can squat down fairly well to do chores.   Patient Stated Goals Get stronger.   Currently in Pain? No/denies            [facility] PT Assessment - 02/03/15 0001    Assessment   Medical Diagnosis Gait instability.   Berg Balance Test   Sit to  Stand Able to stand without using hands and stabilize independently   Standing Unsupported Able to stand safely 2 minutes   Sitting with Back Unsupported but Feet Supported on Floor or Stool Able to sit safely and securely 2 minutes   Stand to Sit Sits safely with minimal use of hands   Transfers Able to transfer safely, minor use of hands   Standing Unsupported with Eyes Closed Able to stand 10 seconds safely   Standing Ubsupported with Feet Together Able to place feet together independently and stand 1 minute safely   From Standing, Reach Forward with Outstretched Arm Can reach confidently >25 cm (10")   From Standing Position, Pick up Object from Floor Able to pick up shoe safely and easily   From Standing Position, Turn to Look Behind Over each Shoulder Looks behind one side only/other side shows less weight shift   Turn 360 Degrees Able to turn 360 degrees safely but slowly   Standing Unsupported, Alternately Place Feet on Step/Stool Able to complete >2 steps/needs minimal assist   Standing Unsupported, One Foot in Front Able to plae foot ahead of the  other independently and hold 30 seconds   Standing on One Leg Able to lift leg independently and hold equal to or more than 3 seconds   Total Score 47                     [facility] Adult PT Treatment/Exercise - February 18, 2015 0001    Lumbar Exercises: Aerobic   Stationary Bike Nustep L5 x72min monitored progression  attempted stationary bike, but couldn't keep feet in pedals                  PT Short Term Goals - 01/06/15 0943    PT SHORT TERM GOAL #1   Title Ind with HEP.   Time 6   Period Weeks   Status Achieved           PT Long Term Goals - 2015/02/18 1234    PT LONG TERM GOAL #1   Title Increase Berg score to 48-50-56.  47/56 on 02/18/2015   Time 6   Status On-going               Plan - 2015-02-18 1229    Clinical Impression Statement Patient did not tolerate therex at all today other than NuStep.  Her score on the BERG stayed the same since eval. She improved in one area but decreased in alternating toe taps which may be due to her foot pain which she is seeing the doctor for today.  Patient does not seem as willing to participate in therapy the past few visits which may be the reason for her lack of progress.   Pt will benefit from skilled therapeutic intervention in order to improve on the following deficits Decreased activity tolerance;Abnormal gait   Rehab Potential Excellent   PT Frequency 2x / week   PT Duration 6 weeks   PT Treatment/Interventions Therapeutic exercise;Balance training;Neuromuscular re-education;Patient/family education   PT Next Visit Plan Balance and gait program; include LE exercise. Work on alternating toe taps.          G-Codes - 18-Feb-2015 1234    Functional Assessment Tool Used Clinical Judgement.   Functional Limitation Mobility: Walking and moving around   Mobility: Walking and Moving Around Current Status [phone]) At least 40 percent but less than 60 percent impaired, limited or restricted   Mobility: Walking and Moving Around Goal Status [phone]) At least 1 percent but less than 20 percent impaired, limited or restricted      Problem List Patient Active Problem List   Diagnosis Date Noted  . Fluid retention 09/30/2014  . Elevated serum creatinine 09/30/2014  . Essential hypertension, benign 09/30/2014  . Secondary hyperparathyroidism, renal ([place]) 09/30/2014  . Proteinuria 09/30/2014  . Anemia associated with chronic renal failure 09/30/2014  . Chronic kidney disease, stage III (moderate) 09/30/2014  . Hypertension 08/21/2012  . Hiatal hernia 08/21/2012  . Seizures ([place]) 08/21/2012    Laraya Pestka 2015/02/18, 12:36 PM  [facility] [address] [place], [place], [address] Phone: [phone]   Fax:  [phone]  Name: ANNETRA BEAGLEY MRN: [id number] Date of Birth: 1923/12/22

## 2015-02-07 ENCOUNTER — Ambulatory Visit: Payer: Medicare Other | Admitting: Physical Therapy

## 2015-02-07 ENCOUNTER — Encounter: Payer: Self-pay | Admitting: Physical Therapy

## 2015-02-07 DIAGNOSIS — R2681 Unsteadiness on feet: Secondary | ICD-10-CM

## 2015-02-07 NOTE — Therapy (Signed)
[facility] [place], [place], [address] Phone: [phone]   Fax:  [phone]  Physical Therapy Treatment  Patient Details  Name: [name] MRN: [id number] Date of Birth: [birth year] No Data Recorded  Encounter Date: 02/07/2015      PT End of Session - 02/07/15 1147    Visit Number 11   Number of Visits 12   Date for PT Re-Evaluation 02/15/15   PT Start Time 1115   PT Stop Time 1144   PT Time Calculation (min) 29 min   Activity Tolerance Patient limited by fatigue   Behavior During Therapy [facility] for tasks assessed/performed      Past Medical History  Diagnosis Date  . Hypertension   . Hiatal hernia   . Stroke ([place])   . Edema   . Neurogenic claudication   . Seizures ([place])   . Internal hemorrhoids   . Eczema   . Obesity   . Seizure disorder ([place])   . Headache   . Gout   . Degenerative arthritis   . Chronic low back pain     Past Surgical History  Procedure Laterality Date  . Bilateral knees    . Appendectomy    . Mammoplasty reduction    . Neuroplasty / transposition median nerve at carpal tunnel bilateral    . Tubal ligation    . Cataracts    . Cholecystectomy    . Abdominal hysterectomy    . Appendectomy    . Re-excision of breast lumpectomy      There were no vitals filed for this visit.  Visit Diagnosis:  Unsteadiness      Subjective Assessment - 02/07/15 1119    Subjective Patient arrived with c/o legs not feeling strong and has not called Dr. about back, foot or legs at this time due to having too many other apts   Patient Stated Goals Get stronger.   Currently in Pain? No/denies                         [facility] Adult PT Treatment/Exercise - 02/07/15 0001    Lumbar Exercises: Aerobic   Stationary Bike Nustep L5 x14min monitored progression   Knee/Hip Exercises: Seated   Long Arc Quad Strengthening;Both;3 sets;10 reps   Long Arc Quad Weight 3 lbs.   Other Seated  Knee/Hip Exercises marching x10 each LE   Marching Weights 3 lbs.                  PT Short Term Goals - 01/06/15 [id number]    PT SHORT TERM GOAL #1   Title Ind with HEP.   Time 6   Period Weeks   Status Achieved           PT Long Term Goals - 02/03/15 1234    PT LONG TERM GOAL #1   Title Increase Berg score to 48-50-56.  47/56 on 02/03/15   Time 6   Status On-going               Plan - 02/07/15 1157    Clinical Impression Statement patient slowly progressing due to other medical issues with fatigue and weakness all over. patient only wanted to perform seated exercises today and ended treatment early due to fatigue. unable to meet any further goals due to balance and new ongoing fatigue. educated patient on seeing an MD to address new issues   Pt will benefit from skilled therapeutic intervention  in order to improve on the following deficits Decreased activity tolerance;Abnormal gait   Rehab Potential Excellent   PT Frequency 2x / week   PT Duration 6 weeks   PT Treatment/Interventions Therapeutic exercise;Balance training;Neuromuscular re-education;Patient/family education   PT Next Visit Plan Balance and gait program; include LE exercise. Work on alternating toe taps./ 1 visit left   Consulted and Agree with Plan of Care Patient        Problem List Patient Active Problem List   Diagnosis Date Noted  . Fluid retention 09/30/2014  . Elevated serum creatinine 09/30/2014  . Essential hypertension, benign 09/30/2014  . Secondary hyperparathyroidism, renal ([place]) 09/30/2014  . Proteinuria 09/30/2014  . Anemia associated with chronic renal failure 09/30/2014  . Chronic kidney disease, stage III (moderate) 09/30/2014  . Hypertension 08/21/2012  . Hiatal hernia 08/21/2012  . Seizures ([place]) 08/21/2012    Dylynn Ketner P, PTA 02/07/2015, 12:05 PM  [facility] [place], [place], [address] Phone:  [phone]   Fax:  [phone]  Name: [name] MRN: [id number] Date of Birth: 11-Jan-1924

## 2015-02-09 ENCOUNTER — Encounter: Payer: Self-pay | Admitting: Physical Therapy

## 2015-02-09 ENCOUNTER — Ambulatory Visit: Payer: Medicare Other | Admitting: Physical Therapy

## 2015-02-09 DIAGNOSIS — R2681 Unsteadiness on feet: Secondary | ICD-10-CM

## 2015-02-09 NOTE — Therapy (Signed)
[facility] [place], [place], [address] Phone: [phone]   Fax:  [phone]  Physical Therapy Treatment  Patient Details  Name: [name] MRN: [id number] Date of Birth: April 03, 1924 No Data Recorded  Encounter Date: 02/09/2015      PT End of Session - 02/09/15 0941    Visit Number 12   Number of Visits 12   Date for PT Re-Evaluation 02/15/15   PT Start Time 0903   PT Stop Time 0938   PT Time Calculation (min) 35 min   Activity Tolerance Patient limited by fatigue;Patient tolerated treatment well   Behavior During Therapy [facility] for tasks assessed/performed      Past Medical History  Diagnosis Date  . Hypertension   . Hiatal hernia   . Stroke ([place])   . Edema   . Neurogenic claudication   . Seizures ([place])   . Internal hemorrhoids   . Eczema   . Obesity   . Seizure disorder ([place])   . Headache   . Gout   . Degenerative arthritis   . Chronic low back pain     Past Surgical History  Procedure Laterality Date  . Bilateral knees    . Appendectomy    . Mammoplasty reduction    . Neuroplasty / transposition median nerve at carpal tunnel bilateral    . Tubal ligation    . Cataracts    . Cholecystectomy    . Abdominal hysterectomy    . Appendectomy    . Re-excision of breast lumpectomy      There were no vitals filed for this visit.  Visit Diagnosis:  Unsteadiness      Subjective Assessment - 02/09/15 0911    Subjective Patient has not contacted dr at this time yet reported she will soon, today she would like to DC   Patient Stated Goals Get stronger.   Currently in Pain? No/denies            [facility] PT Assessment - 02/09/15 0001    Berg Balance Test   Sit to Stand Able to stand without using hands and stabilize independently   Standing Unsupported Able to stand safely 2 minutes   Sitting with Back Unsupported but Feet Supported on Floor or Stool Able to sit safely and securely 2 minutes   Stand to Sit Sits safely with minimal use of hands   Transfers Able to transfer safely, minor use of hands   Standing Unsupported with Eyes Closed Able to stand 10 seconds safely   Standing Ubsupported with Feet Together Able to place feet together independently and stand 1 minute safely   From Standing, Reach Forward with Outstretched Arm Can reach confidently >25 cm (10")   From Standing Position, Pick up Object from Floor Able to pick up shoe safely and easily   From Standing Position, Turn to Look Behind Over each Shoulder Looks behind from both sides and weight shifts well   Turn 360 Degrees Able to turn 360 degrees safely but slowly   Standing Unsupported, Alternately Place Feet on Step/Stool Able to complete >2 steps/needs minimal assist   Standing Unsupported, One Foot in Front Able to plae foot ahead of the other independently and hold 30 seconds   Standing on One Leg Able to lift leg independently and hold equal to or more than 3 seconds   Total Score 48  [facility] Adult PT Treatment/Exercise - 02/27/2015 0001    Lumbar Exercises: Aerobic   Stationary Bike Nustep L5 x45mn monitored progression             Balance Exercises - 111/13/160932    Balance Exercises: Standing   Standing Eyes Opened Wide (BOA);Foam/compliant surface   Tandem Stance Eyes open;Intermittent upper extremity support;Other reps (comment)   Step Ups Forward;6 inch   Partial Tandem Stance Eyes open;Intermittent upper extremity support;4 reps   Turning Both;Limitations  slow yet able to shift independently             PT Short Term Goals - 01/06/15 0943    PT SHORT TERM GOAL #1   Title Ind with HEP.   Time 6   Period Weeks   Status Achieved           PT Long Term Goals - 12016/11/130942    PT LONG TERM GOAL #1   Title Increase Berg score to 48-50-56.   Time 6   Period Weeks   Status Achieved  48/56 12016-11-13              Plan - [id number]     Clinical Impression Statement Patient has met all current goals for therapy and she would like today to be last day. She continues to have minimal balance difficulty although uses a cane for safety and she will be contacting MD re: fatigue all over.   Pt will benefit from skilled therapeutic intervention in order to improve on the following deficits Decreased activity tolerance;Abnormal gait   Rehab Potential Excellent   PT Frequency 2x / week   PT Duration 6 weeks   PT Treatment/Interventions Therapeutic exercise;Balance training;Neuromuscular re-education;Patient/family education   PT Next Visit Plan DC per patient/MPT   Consulted and Agree with Plan of Care Patient          G-Codes - 12016-11-131043    Functional Assessment Tool Used Clinical Judgement.   Functional Limitation Mobility: Walking and moving around   Mobility: Walking and Moving Around Current Status [phone] At least 1 percent but less than 20 percent impaired, limited or restricted   Mobility: Walking and Moving Around Goal Status [phone] At least 1 percent but less than 20 percent impaired, limited or restricted   Mobility: Walking and Moving Around Discharge Status [phone] At least 1 percent but less than 20 percent impaired, limited or restricted      Problem List Patient Active Problem List   Diagnosis Date Noted  . Fluid retention 09/30/2014  . Elevated serum creatinine 09/30/2014  . Essential hypertension, benign 09/30/2014  . Secondary hyperparathyroidism, renal ([place] 09/30/2014  . Proteinuria 09/30/2014  . Anemia associated with chronic renal failure 09/30/2014  . Chronic kidney disease, stage III (moderate) 09/30/2014  . Hypertension 08/21/2012  . Hiatal hernia 08/21/2012  . Seizures ([place] 08/21/2012   PHYSICAL THERAPY DISCHARGE SUMMARY  Visits from Start of Care: 12  Current functional level related to goals / functional outcomes: All goals met.   Remaining deficits: All goals met.   Education /  Equipment: HEP Plan: Patient agrees to discharge.  Patient goals were met. Patient is being discharged due to meeting the stated rehab goals.  ?????       Davena Julian, CMaliMPT 111/13/2016 10:44 AM  [facility] [address]Phone: [phone]  Fax:  [phone] Name: [name] GODBEEMRN: [id number] of Birth: 208-21-1925

## 2015-02-09 NOTE — Therapy (Signed)
[facility] [place], [place], [address] Phone: [phone]   Fax:  [phone]  Physical Therapy Treatment  Patient Details  Name: [name] MRN: [id number] Date of Birth: 01/05/1924 No Data Recorded  Encounter Date: 02/09/2015      PT End of Session - 02/09/15 0941    Visit Number 12   Number of Visits 12   Date for PT Re-Evaluation 02/15/15   PT Start Time 0903   PT Stop Time 0938   PT Time Calculation (min) 35 min   Activity Tolerance Patient limited by fatigue;Patient tolerated treatment well   Behavior During Therapy [facility] for tasks assessed/performed      Past Medical History  Diagnosis Date  . Hypertension   . Hiatal hernia   . Stroke ([place])   . Edema   . Neurogenic claudication   . Seizures ([place])   . Internal hemorrhoids   . Eczema   . Obesity   . Seizure disorder ([place])   . Headache   . Gout   . Degenerative arthritis   . Chronic low back pain     Past Surgical History  Procedure Laterality Date  . Bilateral knees    . Appendectomy    . Mammoplasty reduction    . Neuroplasty / transposition median nerve at carpal tunnel bilateral    . Tubal ligation    . Cataracts    . Cholecystectomy    . Abdominal hysterectomy    . Appendectomy    . Re-excision of breast lumpectomy      There were no vitals filed for this visit.  Visit Diagnosis:  Unsteadiness      Subjective Assessment - 02/09/15 0911    Subjective Patient has not contacted dr at this time yet reported she will soon, today she would like to DC   Patient Stated Goals Get stronger.   Currently in Pain? No/denies            [facility] PT Assessment - 02/09/15 0001    Berg Balance Test   Sit to Stand Able to stand without using hands and stabilize independently   Standing Unsupported Able to stand safely 2 minutes   Sitting with Back Unsupported but Feet Supported on Floor or Stool Able to sit safely and securely 2 minutes   Stand to Sit Sits safely with minimal use of hands   Transfers Able to transfer safely, minor use of hands   Standing Unsupported with Eyes Closed Able to stand 10 seconds safely   Standing Ubsupported with Feet Together Able to place feet together independently and stand 1 minute safely   From Standing, Reach Forward with Outstretched Arm Can reach confidently >25 cm (10")   From Standing Position, Pick up Object from Floor Able to pick up shoe safely and easily   From Standing Position, Turn to Look Behind Over each Shoulder Looks behind from both sides and weight shifts well   Turn 360 Degrees Able to turn 360 degrees safely but slowly   Standing Unsupported, Alternately Place Feet on Step/Stool Able to complete >2 steps/needs minimal assist   Standing Unsupported, One Foot in Front Able to plae foot ahead of the other independently and hold 30 seconds   Standing on One Leg Able to lift leg independently and hold equal to or more than 3 seconds   Total Score 48  [facility] Adult PT Treatment/Exercise - 02/09/15 0001    Lumbar Exercises: Aerobic   Stationary Bike Nustep L5 x29mn monitored progression             Balance Exercises - 02/09/15 0932    Balance Exercises: Standing   Standing Eyes Opened Wide (BOA);Foam/compliant surface   Tandem Stance Eyes open;Intermittent upper extremity support;Other reps (comment)   Step Ups Forward;6 inch   Partial Tandem Stance Eyes open;Intermittent upper extremity support;4 reps   Turning Both;Limitations  slow yet able to shift independently             PT Short Term Goals - 01/06/15 0943    PT SHORT TERM GOAL #1   Title Ind with HEP.   Time 6   Period Weeks   Status Achieved           PT Long Term Goals - 02/09/15 0942    PT LONG TERM GOAL #1   Title Increase Berg score to 48-50-56.   Time 6   Period Weeks   Status Achieved  48/56 02/09/15               Plan - 02/09/15 0942     Clinical Impression Statement Patient has met all current goals for therapy and she would like today to be last day. She continues to have minimal balance difficulty although uses a cane for safety and she will be contacting MD re: fatigue all over.   Pt will benefit from skilled therapeutic intervention in order to improve on the following deficits Decreased activity tolerance;Abnormal gait   Rehab Potential Excellent   PT Frequency 2x / week   PT Duration 6 weeks   PT Treatment/Interventions Therapeutic exercise;Balance training;Neuromuscular re-education;Patient/family education   PT Next Visit Plan DC per patient/MPT   Consulted and Agree with Plan of Care Patient        Problem List Patient Active Problem List   Diagnosis Date Noted  . Fluid retention 09/30/2014  . Elevated serum creatinine 09/30/2014  . Essential hypertension, benign 09/30/2014  . Secondary hyperparathyroidism, renal ([place] 09/30/2014  . Proteinuria 09/30/2014  . Anemia associated with chronic renal failure 09/30/2014  . Chronic kidney disease, stage III (moderate) 09/30/2014  . Hypertension 08/21/2012  . Hiatal hernia 08/21/2012  . [place]iz[place] ([place] 08/21/2012   CLadean Raya PTA 02/09/2015 9:45 AM   Britley Gashi, CVenetia Maxon PTA 02/09/2015, 9:45 AM  [facility] [address]Phone: [phone]  Fax:  [phone] Name: [name] SCALIMRN: [id number] of Birth: [birth year]

## 2015-02-14 ENCOUNTER — Encounter: Payer: Medicare Other | Admitting: Physical Therapy

## 2015-02-16 ENCOUNTER — Encounter: Payer: Medicare Other | Admitting: Physical Therapy

## 2015-02-18 ENCOUNTER — Other Ambulatory Visit: Payer: Self-pay | Admitting: Family Medicine

## 2015-03-08 ENCOUNTER — Encounter: Payer: Self-pay | Admitting: Family Medicine

## 2015-03-08 ENCOUNTER — Ambulatory Visit (INDEPENDENT_AMBULATORY_CARE_PROVIDER_SITE_OTHER): Payer: Medicare Other | Admitting: Family Medicine

## 2015-03-08 VITALS — BP 141/84 | HR 111 | Temp 97.4°F | Ht 64.0 in | Wt 259.0 lb

## 2015-03-08 DIAGNOSIS — M546 Pain in thoracic spine: Secondary | ICD-10-CM | POA: Diagnosis not present

## 2015-03-08 DIAGNOSIS — M549 Dorsalgia, unspecified: Secondary | ICD-10-CM | POA: Diagnosis not present

## 2015-03-08 NOTE — Progress Notes (Signed)
Subjective:    Patient ID: [name], female    DOB: 1923-05-04, 81 y.o.   MRN: [id number]  HPI Patient here today for back pain and back swollen x 1 week. Pain is mid back and neck pain. She is accompanied today by her daughter, [name]. The patient was upset today because her nephew is not doing well after having a massive heart attack and is at [facility]. She is also complaining with some mid back pain with no history of any injury. The patient was also concerned about why she was taking the medications that she was taking. With her daughter present we went through each medication and the reason that she was taking each medication and both the patient and her daughter understood the reasoning behind the medicines that are being taken.      Patient Active Problem List   Diagnosis Date Noted  . Fluid retention 09/30/2014  . Elevated serum creatinine 09/30/2014  . Essential hypertension, benign 09/30/2014  . Secondary hyperparathyroidism, renal ([place]) 09/30/2014  . Proteinuria 09/30/2014  . Anemia associated with chronic renal failure 09/30/2014  . Chronic kidney disease, stage III (moderate) 09/30/2014  . Hypertension 08/21/2012  . Hiatal hernia 08/21/2012  . Seizures ([place]) 08/21/2012   Outpatient Encounter Prescriptions as of 03/08/2015  Medication Sig  . acyclovir (ZOVIRAX) 800 MG tablet TAKE 1 TABLET (800 MG TOTAL) BY MOUTH 5 (FIVE) TIMES DAILY.  Marland Kitchen albuterol (PROVENTIL HFA;VENTOLIN HFA) 108 (90 BASE) MCG/ACT inhaler Inhale 2 puffs into the lungs every 6 (six) hours as needed for wheezing or shortness of breath.  Marland Kitchen albuterol (PROVENTIL) (2.5 MG/3ML) 0.083% nebulizer solution Take 3 mLs (2.5 mg total) by nebulization at bedtime as needed for wheezing or shortness of breath.  Marland Kitchen aspirin 81 MG tablet Take 81 mg by mouth daily.  . ferrous sulfate 325 (65 FE) MG tablet Take 325 mg by mouth daily with breakfast.  . furosemide (LASIX) 80 MG tablet TAKE 1 TABLET IN THE MORNING AND  1 TABLET AT NOON DAILY  . gabapentin (NEURONTIN) 100 MG capsule Take 1 capsule (100 mg total) by mouth 3 (three) times daily.  Marland Kitchen levETIRAcetam (KEPPRA) 1000 MG tablet Take 1 tablet (1,000 mg total) by mouth 2 (two) times daily.  . metolazone (ZAROXOLYN) 2.5 MG tablet Take 1 tablet by mouth 3 (three) times a week.  . Multiple Vitamins-Minerals (MULTIVITAMIN WITH MINERALS) tablet Take 1 tablet by mouth daily.  Marland Kitchen olmesartan (BENICAR) 20 MG tablet TAKE 1 TABLET (20 MG TOTAL) BY MOUTH DAILY.  Marland Kitchen PHENobarbital (LUMINAL) 97.2 MG tablet Take 1 tablet (97.2 mg total) by mouth daily.  Marland Kitchen ULORIC 80 MG TABS TAKE 1 TABLET BY MOUTH EVERY DAY   No facility-administered encounter medications on file as of 03/08/2015.      Review of Systems  Constitutional: Negative.   HENT: Negative.   Eyes: Negative.   Respiratory: Negative.   Cardiovascular: Negative.   Gastrointestinal: Negative.   Endocrine: Negative.   Genitourinary: Negative.   Musculoskeletal: Positive for back pain (and swelling).  Skin: Negative.   Allergic/Immunologic: Negative.   Neurological: Negative.   Hematological: Negative.   Psychiatric/Behavioral: Negative.        Objective:   Physical Exam  Constitutional: She is oriented to person, place, and time. She appears well-developed and well-nourished. She appears distressed.  The patient is alert and kyphotic and will soon be 81 years of age. She uses a cane for ambulation.  HENT:  Head: Normocephalic and atraumatic.  Eyes: Conjunctivae and EOM are normal. Pupils are equal, round, and reactive to light. Right eye exhibits no discharge. Left eye exhibits no discharge. No scleral icterus.  Neck: Normal range of motion.  Cardiovascular: Normal rate, regular rhythm and normal heart sounds.   No murmur heard. At 96/m  Pulmonary/Chest: Effort normal and breath sounds normal. No respiratory distress. She has no wheezes. She has no rales. She exhibits no tenderness.  Musculoskeletal:  She exhibits no edema.  The patient is tender in the mid thoracic spine area there is no sign of any rash or swelling or redness. The patient is kyphotic and uses a cane for ambulation  Neurological: She is alert and oriented to person, place, and time.  Skin: Skin is warm and dry. No rash noted.  Psychiatric: She has a normal mood and affect. Her behavior is normal. Judgment and thought content normal.  Nursing note and vitals reviewed.  BP 141/84 mmHg  Pulse 111  Temp(Src) 97.4 F (36.3 C) (Oral)  Ht 5' 4" (1.626 m)  Wt 259 lb (117.482 kg)  BMI 44.44 kg/m2        Assessment & Plan:  1. Back pain, unspecified location -Use warm wet compresses 20 minutes 4 times daily -Take Tylenol arthritis every 8 hours if needed -Try salonpas - POCT urinalysis dipstick - POCT UA - Microscopic Only - Urine culture - BMP8+EGFR  2. Thoracic spine pain -Do as recommended above -If pain continues beyond 2 weeks return to clinic and get thoracic spine films  3. Chronic kidney disease -BMP and CBC--- fax to Dr. Justin Mend  Patient Instructions  Take Tylenol arthritis Consider Solonpas  We will call you with labs results Use Moist heat 20 mins four times a day   Arrie Senate MD

## 2015-03-08 NOTE — Patient Instructions (Signed)
Take Tylenol arthritis Consider Solonpas  We will call you with labs results Use Moist heat 20 mins four times a day

## 2015-03-09 ENCOUNTER — Other Ambulatory Visit: Payer: Medicare Other

## 2015-03-09 LAB — POCT URINALYSIS DIPSTICK
Bilirubin, UA: NEGATIVE
Glucose, UA: NEGATIVE
KETONES UA: NEGATIVE
Nitrite, UA: NEGATIVE
PH UA: 5
PROTEIN UA: NEGATIVE
SPEC GRAV UA: 1.015
Urobilinogen, UA: NEGATIVE

## 2015-03-09 LAB — BMP8+EGFR
BUN / CREAT RATIO: 18 (ref 11–26)
BUN: 35 mg/dL — ABNORMAL HIGH (ref 8–27)
CO2: 27 mmol/L (ref 18–29)
Calcium: 10.7 mg/dL — ABNORMAL HIGH (ref 8.7–10.3)
Chloride: 99 mmol/L (ref 97–106)
Creatinine, Ser: 1.98 mg/dL — ABNORMAL HIGH (ref 0.57–1.00)
GFR calc Af Amer: 25 mL/min/{1.73_m2} — ABNORMAL LOW (ref >59)
GFR calc non Af Amer: 22 mL/min/{1.73_m2} — ABNORMAL LOW (ref >59)
GLUCOSE: 92 mg/dL (ref 65–99)
POTASSIUM: 4.2 mmol/L (ref 3.5–5.2)
SODIUM: 141 mmol/L (ref 136–144)

## 2015-03-09 LAB — POCT UA - MICROSCOPIC ONLY
CASTS, UR, LPF, POC: NEGATIVE
Crystals, Ur, HPF, POC: NEGATIVE
MUCUS UA: NEGATIVE
RBC, urine, microscopic: NEGATIVE
WBC, UR, HPF, POC: NEGATIVE
YEAST UA: NEGATIVE

## 2015-03-11 LAB — URINE CULTURE: Organism ID, Bacteria: NO GROWTH

## 2015-03-21 ENCOUNTER — Other Ambulatory Visit: Payer: Self-pay | Admitting: Neurology

## 2015-03-21 ENCOUNTER — Other Ambulatory Visit: Payer: Self-pay | Admitting: Family Medicine

## 2015-03-23 ENCOUNTER — Other Ambulatory Visit: Payer: Medicare Other

## 2015-03-23 ENCOUNTER — Other Ambulatory Visit: Payer: Self-pay | Admitting: *Deleted

## 2015-03-23 DIAGNOSIS — D649 Anemia, unspecified: Secondary | ICD-10-CM

## 2015-03-23 MED ORDER — ACYCLOVIR 800 MG PO TABS: 800.0000 mg | ORAL_TABLET | Freq: Every day | ORAL | Status: DC

## 2015-03-23 NOTE — Progress Notes (Signed)
Lab only 

## 2015-03-24 LAB — CBC WITH DIFFERENTIAL/PLATELET
BASOS ABS: 0.1 10*3/uL (ref 0.0–0.2)
BASOS: 2 %
EOS (ABSOLUTE): 0.4 10*3/uL (ref 0.0–0.4)
Eos: 6 %
HEMOGLOBIN: 10.6 g/dL — AB (ref 11.1–15.9)
Hematocrit: 32.7 % — ABNORMAL LOW (ref 34.0–46.6)
IMMATURE GRANS (ABS): 0 10*3/uL (ref 0.0–0.1)
IMMATURE GRANULOCYTES: 0 %
LYMPHS: 49 %
Lymphocytes Absolute: 3 10*3/uL (ref 0.7–3.1)
MCH: 23.8 pg — ABNORMAL LOW (ref 26.6–33.0)
MCHC: 32.4 g/dL (ref 31.5–35.7)
MCV: 74 fL — AB (ref 79–97)
MONOCYTES: 5 %
Monocytes Absolute: 0.3 10*3/uL (ref 0.1–0.9)
NEUTROS PCT: 38 %
Neutrophils Absolute: 2.3 10*3/uL (ref 1.4–7.0)
PLATELETS: 166 10*3/uL (ref 150–379)
RBC: 4.45 x10E6/uL (ref 3.77–5.28)
RDW: 16.9 % — ABNORMAL HIGH (ref 12.3–15.4)
WBC: 6.2 10*3/uL (ref 3.4–10.8)

## 2015-03-30 ENCOUNTER — Ambulatory Visit: Payer: Medicare Other | Admitting: Adult Health

## 2015-04-04 ENCOUNTER — Other Ambulatory Visit: Payer: Self-pay | Admitting: Neurology

## 2015-04-12 ENCOUNTER — Other Ambulatory Visit: Payer: Self-pay | Admitting: Family Medicine

## 2015-04-19 ENCOUNTER — Telehealth: Payer: Self-pay | Admitting: Family Medicine

## 2015-04-19 NOTE — Telephone Encounter (Signed)
Take extra strength Tylenol 1 every 4-6 hours if needed for severe pain

## 2015-04-19 NOTE — Telephone Encounter (Signed)
Taking medication for her outbreak but it seems she needs a stronger dose or something else. Please advise.  Can not come to office because she broke her teeth and just to anxious and sick with this.

## 2015-04-19 NOTE — Telephone Encounter (Signed)
PT DAUGHTER AWARE 

## 2015-05-21 ENCOUNTER — Other Ambulatory Visit: Payer: Self-pay | Admitting: Family Medicine

## 2015-05-21 NOTE — Therapy (Signed)
[facility] [place], [place], [address] Phone: [phone]   Fax:  [phone]  Physical Therapy Treatment  Patient Details  Name: RAIZY AUZENNE MRN: [id number] Date of Birth: Feb 11, 1924 No Data Recorded  Encounter Date: 02/09/2015    Past Medical History  Diagnosis Date  . Hypertension   . Hiatal hernia   . Stroke ([place])   . Edema   . Neurogenic claudication   . Seizures ([place])   . Internal hemorrhoids   . Eczema   . Obesity   . Seizure disorder ([place])   . Headache   . Gout   . Degenerative arthritis   . Chronic low back pain     Past Surgical History  Procedure Laterality Date  . Bilateral knees    . Appendectomy    . Mammoplasty reduction    . Neuroplasty / transposition median nerve at carpal tunnel bilateral    . Tubal ligation    . Cataracts    . Cholecystectomy    . Abdominal hysterectomy    . Appendectomy    . Re-excision of breast lumpectomy      There were no vitals filed for this visit.  Visit Diagnosis:  Unsteadiness                                 PT Short Term Goals - 01/06/15 0943    PT SHORT TERM GOAL #1   Title Ind with HEP.   Time 6   Period Weeks   Status Achieved           PT Long Term Goals - 02/09/15 0942    PT LONG TERM GOAL #1   Title Increase Berg score to 48-50-56.   Time 6   Period Weeks   Status Achieved  48/56 02/09/15               Problem List Patient Active Problem List   Diagnosis Date Noted  . Fluid retention 09/30/2014  . Elevated serum creatinine 09/30/2014  . Essential hypertension, benign 09/30/2014  . Secondary hyperparathyroidism, renal ([place]) 09/30/2014  . Proteinuria 09/30/2014  . Anemia associated with chronic renal failure 09/30/2014  . Chronic kidney disease, stage III (moderate) 09/30/2014  . Hypertension 08/21/2012  . Hiatal hernia 08/21/2012  . Seizures ([place]) 08/21/2012   PHYSICAL THERAPY  DISCHARGE SUMMARY  Visits from Start of Care: 13  Current functional level related to goals / functional outcomes: Please see above.   Remaining deficits: None.  Education / Equipment: HEP.  Plan: Patient agrees to discharge.  Patient goals were met. Patient is being discharged due to meeting the stated rehab goals.  ?????      Tarhonda Hollenberg, [place] MPT 05/21/2015, 4:23 PM  [facility] [address] [place], [place], [address] Phone: [phone]   Fax:  [phone]  Name: DOREENE FORREY MRN: [id number] Date of Birth: 1923/04/29

## 2015-05-30 ENCOUNTER — Other Ambulatory Visit: Payer: Self-pay | Admitting: Family Medicine

## 2015-05-30 NOTE — Telephone Encounter (Signed)
Last seen 03/08/15 DWM  If approved route to nurse to call into [facility]

## 2015-06-13 ENCOUNTER — Other Ambulatory Visit: Payer: Self-pay | Admitting: Family Medicine

## 2015-06-19 ENCOUNTER — Other Ambulatory Visit: Payer: Self-pay | Admitting: Neurology

## 2015-06-21 ENCOUNTER — Other Ambulatory Visit: Payer: Self-pay | Admitting: Family Medicine

## 2015-06-28 ENCOUNTER — Other Ambulatory Visit: Payer: Self-pay | Admitting: Neurology

## 2015-06-28 ENCOUNTER — Other Ambulatory Visit: Payer: Self-pay | Admitting: Family Medicine

## 2015-07-07 ENCOUNTER — Ambulatory Visit (INDEPENDENT_AMBULATORY_CARE_PROVIDER_SITE_OTHER): Payer: Medicare Other | Admitting: Neurology

## 2015-07-07 ENCOUNTER — Encounter: Payer: Self-pay | Admitting: Neurology

## 2015-07-07 VITALS — BP 134/72 | HR 101 | Ht 64.0 in | Wt 250.0 lb

## 2015-07-07 DIAGNOSIS — R569 Unspecified convulsions: Secondary | ICD-10-CM

## 2015-07-07 MED ORDER — LEVETIRACETAM ER 500 MG PO TB24: 1500.0000 mg | ORAL_TABLET | Freq: Every day | ORAL | Status: DC

## 2015-07-07 NOTE — Progress Notes (Signed)
Reason for visit: Seizures  [name] is an 82 y.o. female  History of present illness:  [name] is a 82 year old right-handed black female with a history of partial complex type seizures. The patient has been on phenobarbital for number of years, and she has also been on Keppra. The patient is on 1000 mg twice daily of the Keppra, but she believes this is causing some drowsiness during the day. The patient indicates that she naps off and on throughout the day, but she has difficulty sleeping at nighttime. The patient takes phenobarbital at nighttime. She has had one seizure that occurred in early December 2016. She lives alone, her seizures are usually associated with a brief staring episode. She does not operate a motor vehicle. She wishes to cut back on her medication because of the drowsiness. She comes to this office for an evaluation.  Past Medical History  Diagnosis Date  . Hypertension   . Hiatal hernia   . Stroke ([place])   . Edema   . Neurogenic claudication   . Seizures ([place])   . Internal hemorrhoids   . Eczema   . Obesity   . Seizure disorder ([place])   . Headache   . Gout   . Degenerative arthritis   . Chronic low back pain     Past Surgical History  Procedure Laterality Date  . Bilateral knees    . Appendectomy    . Mammoplasty reduction    . Neuroplasty / transposition median nerve at carpal tunnel bilateral    . Tubal ligation    . Cataracts    . Cholecystectomy    . Abdominal hysterectomy    . Appendectomy    . Re-excision of breast lumpectomy      Family History  Problem Relation Age of Onset  . Diabetes Mother   . Heart disease Mother   . Heart disease Father   . Cancer Sister   . Cancer Brother   . Cancer Sister     Social history:  reports that she has never smoked. She has never used smokeless tobacco. She reports that she does not drink alcohol or use illicit drugs.    Allergies  Allergen Reactions  . Advicor [Niacin-Lovastatin Er]    . Caduet [Amlodipine-Atorvastatin]   . Codeine   . Dilantin [Phenytoin]   . Lescol [Fluvastatin]   . Pamelor [Nortriptyline]   . Penicillins   . Sulfa Antibiotics   . Trileptal [Oxcarbazepine]   . Ultram [Tramadol]   . Welchol [Colesevelam]     Medications:  Prior to Admission medications   Medication Sig Start Date End Date Taking? Authorizing Provider  acyclovir (ZOVIRAX) 800 MG tablet TAKE 1 TABLET (800 MG TOTAL) BY MOUTH 5 (FIVE) TIMES DAILY. 06/22/15   Chipper Herb, MD  albuterol (PROVENTIL HFA;VENTOLIN HFA) 108 (90 BASE) MCG/ACT inhaler Inhale 2 puffs into the lungs every 6 (six) hours as needed for wheezing or shortness of breath. 01/03/15   Chipper Herb, MD  albuterol (PROVENTIL) (2.5 MG/3ML) 0.083% nebulizer solution Take 3 mLs (2.5 mg total) by nebulization at bedtime as needed for wheezing or shortness of breath. 01/03/15   Chipper Herb, MD  aspirin 81 MG tablet Take 81 mg by mouth daily.    Historical Provider, MD  ferrous sulfate 325 (65 FE) MG tablet Take 325 mg by mouth daily with breakfast.    Historical Provider, MD  furosemide (LASIX) 80 MG tablet TAKE 1 TABLET IN THE MORNING  AND 1 TABLET AT NOON DAILY 04/13/15   Chipper Herb, MD  gabapentin (NEURONTIN) 100 MG capsule Take 1 capsule (100 mg total) by mouth 3 (three) times daily. 03/29/14   Kathrynn Ducking, MD  levETIRAcetam (KEPPRA) 1000 MG tablet TAKE 1 TABLET (1,000 MG TOTAL) BY MOUTH 2 (TWO) TIMES DAILY. 06/28/15   Kathrynn Ducking, MD  metolazone (ZAROXOLYN) 2.5 MG tablet TAKE 1 TABLET ON MON, WED, AND FRIDAY 06/29/15   Chipper Herb, MD  Multiple Vitamins-Minerals (MULTIVITAMIN WITH MINERALS) tablet Take 1 tablet by mouth daily.    Historical Provider, MD  olmesartan (BENICAR) 20 MG tablet TAKE 1 TABLET (20 MG TOTAL) BY MOUTH DAILY. 06/13/15   Chipper Herb, MD  PHENobarbital (LUMINAL) 97.2 MG tablet TAKE 1 TABLET EVERY DAY 05/30/15   Chipper Herb, MD  ULORIC 80 MG TABS TAKE 1 TABLET BY MOUTH EVERY DAY 05/23/15    Chipper Herb, MD    ROS:  Out of a complete 14 system review of symptoms, the patient complains only of the following symptoms, and all other reviewed systems are negative.  Seizures Drowsiness  Blood pressure 134/72, pulse 101, height 5\' 4"  (1.626 m), weight 250 lb (113.399 kg).  Physical Exam  General: The patient is alert and cooperative at the time of the examination. The patient is moderately to markedly obese.  Skin: No significant peripheral edema is noted.   Neurologic Exam  Mental status: The patient is alert and oriented x 3 at the time of the examination. The patient has apparent normal recent and remote memory, with an apparently normal attention span and concentration ability.   Cranial nerves: Facial symmetry is present. Speech is normal, no aphasia or dysarthria is noted. Extraocular movements are full. Visual fields are full.  Motor: The patient has good strength in all 4 extremities.  Sensory examination: Soft touch sensation is symmetric on the face, arms, and legs.  Coordination: The patient has good finger-nose-finger and heel-to-shin bilaterally.  Gait and station: The patient has a somewhat wide-based gait. The patient uses a cane for ambulation. Tandem gait was not attempted. Romberg is negative. No drift is seen.  Reflexes: Deep tendon reflexes are symmetric, but are depressed.   Assessment/Plan:  1. Partial complex seizures  2. Gait disorder  3. Excessive daytime drowsiness  The patient is having some drowsiness on the seizure medications. We will cut back on the Keppra taking 1500 mg daily. If the seizure frequency significantly increases, we may have to go back up on the dose. The patient will be changed to a long-acting preparation taking 500 mg XR tablet, 3 at night. The patient may take the medication at the same time she takes the phenobarbital. She will follow-up through this office in one year, sooner if needed. A prescription was  written for the Rainbow.  Jill Alexanders MD 07/07/2015 6:47 PM  [facility] [address] [place], [place] [address]  Phone [phone] Fax [phone]

## 2015-08-04 ENCOUNTER — Other Ambulatory Visit: Payer: Medicare Other

## 2015-08-04 DIAGNOSIS — N183 Chronic kidney disease, stage 3 unspecified: Secondary | ICD-10-CM

## 2015-08-04 DIAGNOSIS — D509 Iron deficiency anemia, unspecified: Secondary | ICD-10-CM

## 2015-08-04 DIAGNOSIS — I10 Essential (primary) hypertension: Secondary | ICD-10-CM

## 2015-08-04 DIAGNOSIS — E559 Vitamin D deficiency, unspecified: Secondary | ICD-10-CM

## 2015-08-04 DIAGNOSIS — G40909 Epilepsy, unspecified, not intractable, without status epilepticus: Secondary | ICD-10-CM

## 2015-08-04 DIAGNOSIS — E785 Hyperlipidemia, unspecified: Secondary | ICD-10-CM

## 2015-08-05 LAB — CBC WITH DIFFERENTIAL/PLATELET
BASOS: 1 %
Basophils Absolute: 0.1 10*3/uL (ref 0.0–0.2)
EOS (ABSOLUTE): 1.6 10*3/uL — ABNORMAL HIGH (ref 0.0–0.4)
EOS: 19 %
HEMATOCRIT: 32.8 % — AB (ref 34.0–46.6)
HEMOGLOBIN: 10.6 g/dL — AB (ref 11.1–15.9)
IMMATURE GRANS (ABS): 0 10*3/uL (ref 0.0–0.1)
Immature Granulocytes: 0 %
LYMPHS: 34 %
Lymphocytes Absolute: 2.8 10*3/uL (ref 0.7–3.1)
MCH: 24.1 pg — AB (ref 26.6–33.0)
MCHC: 32.3 g/dL (ref 31.5–35.7)
MCV: 75 fL — AB (ref 79–97)
MONOCYTES: 4 %
Monocytes Absolute: 0.3 10*3/uL (ref 0.1–0.9)
NEUTROS ABS: 3.6 10*3/uL (ref 1.4–7.0)
Neutrophils: 42 %
Platelets: 168 10*3/uL (ref 150–379)
RBC: 4.4 x10E6/uL (ref 3.77–5.28)
RDW: 16.2 % — ABNORMAL HIGH (ref 12.3–15.4)
WBC: 8.4 10*3/uL (ref 3.4–10.8)

## 2015-08-05 LAB — LIPID PANEL
CHOLESTEROL TOTAL: 172 mg/dL (ref 100–199)
Chol/HDL Ratio: 4.5 ratio units — ABNORMAL HIGH (ref 0.0–4.4)
HDL: 38 mg/dL — ABNORMAL LOW (ref >39)
LDL Calculated: 83 mg/dL (ref 0–99)
Triglycerides: 255 mg/dL — ABNORMAL HIGH (ref 0–149)
VLDL Cholesterol Cal: 51 mg/dL — ABNORMAL HIGH (ref 5–40)

## 2015-08-05 LAB — BMP8+EGFR
BUN/Creatinine Ratio: 24 (ref 12–28)
BUN: 47 mg/dL — AB (ref 10–36)
CALCIUM: 10.5 mg/dL — AB (ref 8.7–10.3)
CO2: 27 mmol/L (ref 18–29)
CREATININE: 1.98 mg/dL — AB (ref 0.57–1.00)
Chloride: 94 mmol/L — ABNORMAL LOW (ref 96–106)
GFR, EST AFRICAN AMERICAN: 25 mL/min/{1.73_m2} — AB (ref >59)
GFR, EST NON AFRICAN AMERICAN: 22 mL/min/{1.73_m2} — AB (ref >59)
Glucose: 106 mg/dL — ABNORMAL HIGH (ref 65–99)
POTASSIUM: 3.7 mmol/L (ref 3.5–5.2)
Sodium: 139 mmol/L (ref 134–144)

## 2015-08-05 LAB — HEPATIC FUNCTION PANEL
ALK PHOS: 50 IU/L (ref 39–117)
ALT: 14 IU/L (ref 0–32)
AST: 22 IU/L (ref 0–40)
Albumin: 4.4 g/dL (ref 3.2–4.6)
BILIRUBIN TOTAL: 0.3 mg/dL (ref 0.0–1.2)
BILIRUBIN, DIRECT: 0.09 mg/dL (ref 0.00–0.40)
Total Protein: 7.2 g/dL (ref 6.0–8.5)

## 2015-08-05 LAB — VITAMIN D 25 HYDROXY (VIT D DEFICIENCY, FRACTURES): VIT D 25 HYDROXY: 32.1 ng/mL (ref 30.0–100.0)

## 2015-08-19 ENCOUNTER — Other Ambulatory Visit: Payer: Self-pay | Admitting: Family Medicine

## 2015-09-13 ENCOUNTER — Other Ambulatory Visit: Payer: Self-pay | Admitting: Family Medicine

## 2015-09-13 NOTE — Telephone Encounter (Signed)
last seen 03/08/15  DWM

## 2015-09-15 ENCOUNTER — Other Ambulatory Visit: Payer: Self-pay | Admitting: Family Medicine

## 2015-09-15 ENCOUNTER — Ambulatory Visit: Payer: Medicare Other | Admitting: Family

## 2015-09-16 ENCOUNTER — Ambulatory Visit (INDEPENDENT_AMBULATORY_CARE_PROVIDER_SITE_OTHER): Payer: Medicare Other | Admitting: Family

## 2015-09-16 ENCOUNTER — Encounter: Payer: Self-pay | Admitting: Family

## 2015-09-16 VITALS — BP 95/67 | HR 104 | Temp 97.4°F | Ht 64.0 in | Wt 238.8 lb

## 2015-09-16 DIAGNOSIS — B372 Candidiasis of skin and nail: Secondary | ICD-10-CM

## 2015-09-16 DIAGNOSIS — Z0000 Encounter for general adult medical examination without abnormal findings: Secondary | ICD-10-CM | POA: Diagnosis not present

## 2015-09-16 DIAGNOSIS — L309 Dermatitis, unspecified: Secondary | ICD-10-CM

## 2015-09-16 MED ORDER — HYDROXYZINE HCL 50 MG PO TABS: 50.0000 mg | ORAL_TABLET | Freq: Three times a day (TID) | ORAL | Status: DC | PRN

## 2015-09-16 MED ORDER — NYSTATIN-TRIAMCINOLONE 100000-0.1 UNIT/GM-% EX OINT: 1.0000 "application " | TOPICAL_OINTMENT | Freq: Two times a day (BID) | CUTANEOUS | Status: DC

## 2015-09-16 MED ORDER — NYSTATIN 100000 UNIT/GM EX POWD: Freq: Three times a day (TID) | CUTANEOUS | Status: DC

## 2015-09-16 NOTE — Progress Notes (Signed)
Subjective:   [name] is a 82 y.o. female who presents for Medicare Annual (Subsequent) preventive examination.  Review of Systems:  Generalized rash under breast, groin, and abdomen.        Objective:     Vitals: BP 95/67 mmHg  Pulse 104  Temp(Src) 97.4 F (36.3 C) (Oral)  Ht 5\' 4"  (1.626 m)  Wt 238 lb 12.8 oz (108.319 kg)  BMI 40.97 kg/m2  Body mass index is 40.97 kg/(m^2).   Tobacco History  Smoking status  . Never Smoker   Smokeless tobacco  . Never Used     Counseling given: Not Answered   Past Medical History  Diagnosis Date  . Hypertension   . Hiatal hernia   . Stroke ([place])   . Edema   . Neurogenic claudication   . Seizures ([place])   . Internal hemorrhoids   . Eczema   . Obesity   . Seizure disorder ([place])   . Headache   . Gout   . Degenerative arthritis   . Chronic low back pain    Past Surgical History  Procedure Laterality Date  . Bilateral knees    . Appendectomy    . Mammoplasty reduction    . Neuroplasty / transposition median nerve at carpal tunnel bilateral    . Tubal ligation    . Cataracts    . Cholecystectomy    . Abdominal hysterectomy    . Appendectomy    . Re-excision of breast lumpectomy     Family History  Problem Relation Age of Onset  . Diabetes Mother   . Heart disease Mother   . Heart disease Father   . Cancer Sister   . Cancer Brother   . Cancer Sister    History  Sexual Activity  . Sexual Activity: Not on file    Outpatient Encounter Prescriptions as of 09/16/2015  Medication Sig  . acyclovir (ZOVIRAX) 800 MG tablet TAKE 1 TABLET (800 MG TOTAL) BY MOUTH 5 (FIVE) TIMES DAILY.  Marland Kitchen albuterol (PROVENTIL HFA;VENTOLIN HFA) 108 (90 BASE) MCG/ACT inhaler Inhale 2 puffs into the lungs every 6 (six) hours as needed for wheezing or shortness of breath.  Marland Kitchen albuterol (PROVENTIL) (2.5 MG/3ML) 0.083% nebulizer solution Take 3 mLs (2.5 mg total) by nebulization at bedtime as needed for wheezing or shortness of breath.    Marland Kitchen aspirin 81 MG tablet Take 81 mg by mouth daily.  . ferrous sulfate 325 (65 FE) MG tablet Take 325 mg by mouth daily with breakfast.  . furosemide (LASIX) 80 MG tablet TAKE 1 TABLET IN THE MORNING AND 1 TABLET AT NOON DAILY  . gabapentin (NEURONTIN) 100 MG capsule Take 1 capsule (100 mg total) by mouth 3 (three) times daily.  Marland Kitchen levETIRAcetam (KEPPRA XR) 500 MG 24 hr tablet Take 3 tablets (1,500 mg total) by mouth at bedtime.  . metolazone (ZAROXOLYN) 2.5 MG tablet TAKE 1 TABLET ON MON, WED, AND FRIDAY  . Multiple Vitamins-Minerals (MULTIVITAMIN WITH MINERALS) tablet Take 1 tablet by mouth daily.  Marland Kitchen olmesartan (BENICAR) 20 MG tablet TAKE 1 TABLET (20 MG TOTAL) BY MOUTH DAILY.  Marland Kitchen PHENobarbital (LUMINAL) 97.2 MG tablet TAKE 1 TABLET EVERY DAY  . ULORIC 80 MG TABS TAKE 1 TABLET BY MOUTH EVERY DAY  . levETIRAcetam (KEPPRA) 1000 MG tablet Reported on 09/16/2015  . triamcinolone cream (KENALOG) 0.1 % APPLY 1 APPLICATION ON THE SKIN TWICE A DAY AS NEEDED (PRN)   No facility-administered encounter medications on file as of  09/16/2015.    Activities of Daily Living In your present state of health, do you have any difficulty performing the following activities: 09/16/2015  Hearing? N  Vision? N  Difficulty concentrating or making decisions? N  Walking or climbing stairs? Y  Dressing or bathing? Y  Doing errands, shopping? Y  Preparing Food and eating ? Y  Using the Toilet? Y  In the past six months, have you accidently leaked urine? Y  Do you have problems with loss of bowel control? Y  Managing your Medications? N  Managing your Finances? N  Housekeeping or managing your Housekeeping? N    Patient Care Team: [name], [name] as PCP - General (Family Medicine) Edrick Oh, MD as Consulting Physician (Nephrology)    Assessment:    Exercise Activities and Dietary recommendations  Increase physical activity and encourage healthy diet  Goals    None     Fall Risk Fall Risk  09/16/2015  03/08/2015 01/03/2015 12/17/2014 10/25/2014  Falls in the past year? No No No No No   Depression Screen PHQ 2/9 Scores 09/16/2015 03/08/2015 01/03/2015 12/17/2014  PHQ - 2 Score 1 0 0 0  PHQ- 9 Score 7 - - -     Cognitive Testing No flowsheet data found.  There is no immunization history for the selected administration types on file for this patient. Screening Tests Health Maintenance  Topic Date Due  . TETANUS/TDAP  05/26/1942  . ZOSTAVAX  05/26/1985  . DEXA SCAN  05/26/1990  . PNA vac Low Risk Adult (1 of 2 - PCV13) 05/26/1990  . INFLUENZA VACCINE  11/15/2015      Plan:   During the course of the visit the patient was educated and counseled about the following appropriate screening and preventive services:   Vaccines to include Pneumoccal, Influenza, Hepatitis B, Td, Zostavax, HCV  Electrocardiogram  Cardiovascular Disease  Colorectal cancer screening  Bone density screening  Diabetes screening  Glaucoma screening  Mammography/PAP  Nutrition counseling   Patient Instructions (the written plan) was given to the patient.   [name], [place]  09/16/2015

## 2015-09-16 NOTE — Patient Instructions (Addendum)
Health Maintenance, Female Adopting a healthy lifestyle and getting preventive care can go a long way to promote health and wellness. Talk with your health care provider about what schedule of regular examinations is right for you. This is a good chance for you to check in with your provider about disease prevention and staying healthy. In between checkups, there are plenty of things you can do on your own. Experts have done a lot of research about which lifestyle changes and preventive measures are most likely to keep you healthy. Ask your health care provider for more information. WEIGHT AND DIET  Eat a healthy diet  Be sure to include plenty of vegetables, fruits, low-fat dairy products, and lean protein.  Do not eat a lot of foods high in solid fats, added sugars, or salt.  Get regular exercise. This is one of the most important things you can do for your health.  Most adults should exercise for at least 150 minutes each week. The exercise should increase your heart rate and make you sweat (moderate-intensity exercise).  Most adults should also do strengthening exercises at least twice a week. This is in addition to the moderate-intensity exercise.  Maintain a healthy weight  Body mass index (BMI) is a measurement that can be used to identify possible weight problems. It estimates body fat based on height and weight. Your health care provider can help determine your BMI and help you achieve or maintain a healthy weight.  For females 20 years of age and older:   A BMI below 18.5 is considered underweight.  A BMI of 18.5 to 24.9 is normal.  A BMI of 25 to 29.9 is considered overweight.  A BMI of 30 and above is considered obese.  Watch levels of cholesterol and blood lipids  You should start having your blood tested for lipids and cholesterol at 80 years of age, then have this test every 5 years.  You may need to have your cholesterol levels checked more often if:  Your lipid  or cholesterol levels are high.  You are older than 80 years of age.  You are at high risk for heart disease.  CANCER SCREENING   Lung Cancer  Lung cancer screening is recommended for adults 55-80 years old who are at high risk for lung cancer because of a history of smoking.  A yearly low-dose CT scan of the lungs is recommended for people who:  Currently smoke.  Have quit within the past 15 years.  Have at least a 30-pack-year history of smoking. A pack year is smoking an average of one pack of cigarettes a day for 1 year.  Yearly screening should continue until it has been 15 years since you quit.  Yearly screening should stop if you develop a health problem that would prevent you from having lung cancer treatment.  Breast Cancer  Practice breast self-awareness. This means understanding how your breasts normally appear and feel.  It also means doing regular breast self-exams. Let your health care provider know about any changes, no matter how small.  If you are in your 20s or 30s, you should have a clinical breast exam (CBE) by a health care provider every 1-3 years as part of a regular health exam.  If you are 40 or older, have a CBE every year. Also consider having a breast X-ray (mammogram) every year.  If you have a family history of breast cancer, talk to your health care provider about genetic screening.  If you   are at high risk for breast cancer, talk to your health care provider about having an MRI and a mammogram every year.  Breast cancer gene (BRCA) assessment is recommended for women who have family members with BRCA-related cancers. BRCA-related cancers include:  Breast.  Ovarian.  Tubal.  Peritoneal cancers.  Results of the assessment will determine the need for genetic counseling and BRCA1 and BRCA2 testing. Cervical Cancer Your health care provider may recommend that you be screened regularly for cancer of the pelvic organs (ovaries, uterus, and  vagina). This screening involves a pelvic examination, including checking for microscopic changes to the surface of your cervix (Pap test). You may be encouraged to have this screening done every 3 years, beginning at age 21.  For women ages 30-65, health care providers may recommend pelvic exams and Pap testing every 3 years, or they may recommend the Pap and pelvic exam, combined with testing for human papilloma virus (HPV), every 5 years. Some types of HPV increase your risk of cervical cancer. Testing for HPV may also be done on women of any age with unclear Pap test results.  Other health care providers may not recommend any screening for nonpregnant women who are considered low risk for pelvic cancer and who do not have symptoms. Ask your health care provider if a screening pelvic exam is right for you.  If you have had past treatment for cervical cancer or a condition that could lead to cancer, you need Pap tests and screening for cancer for at least 20 years after your treatment. If Pap tests have been discontinued, your risk factors (such as having a new sexual partner) need to be reassessed to determine if screening should resume. Some women have medical problems that increase the chance of getting cervical cancer. In these cases, your health care provider may recommend more frequent screening and Pap tests. Colorectal Cancer  This type of cancer can be detected and often prevented.  Routine colorectal cancer screening usually begins at 80 years of age and continues through 80 years of age.  Your health care provider may recommend screening at an earlier age if you have risk factors for colon cancer.  Your health care provider may also recommend using home test kits to check for hidden blood in the stool.  A small camera at the end of a tube can be used to examine your colon directly (sigmoidoscopy or colonoscopy). This is done to check for the earliest forms of colorectal  cancer.  Routine screening usually begins at age 50.  Direct examination of the colon should be repeated every 5-10 years through 80 years of age. However, you may need to be screened more often if early forms of precancerous polyps or small growths are found. Skin Cancer  Check your skin from head to toe regularly.  Tell your health care provider about any new moles or changes in moles, especially if there is a change in a mole's shape or color.  Also tell your health care provider if you have a mole that is larger than the size of a pencil eraser.  Always use sunscreen. Apply sunscreen liberally and repeatedly throughout the day.  Protect yourself by wearing long sleeves, pants, a wide-brimmed hat, and sunglasses whenever you are outside. HEART DISEASE, DIABETES, AND HIGH BLOOD PRESSURE   High blood pressure causes heart disease and increases the risk of stroke. High blood pressure is more likely to develop in:  People who have blood pressure in the high end   of the normal range (130-139/85-89 mm Hg).  People who are overweight or obese.  People who are African American.  If you are 38-23 years of age, have your blood pressure checked every 3-5 years. If you are 61 years of age or older, have your blood pressure checked every year. You should have your blood pressure measured twice--once when you are at a hospital or clinic, and once when you are not at a hospital or clinic. Record the average of the two measurements. To check your blood pressure when you are not at a hospital or clinic, you can use:  An automated blood pressure machine at a pharmacy.  A home blood pressure monitor.  If you are between 45 years and 39 years old, ask your health care provider if you should take aspirin to prevent strokes.  Have regular diabetes screenings. This involves taking a blood sample to check your fasting blood sugar level.  If you are at a normal weight and have a low risk for diabetes,  have this test once every three years after 80 years of age.  If you are overweight and have a high risk for diabetes, consider being tested at a younger age or more often. PREVENTING INFECTION  Hepatitis B  If you have a higher risk for hepatitis B, you should be screened for this virus. You are considered at high risk for hepatitis B if:  You were born in a country where hepatitis B is common. Ask your health care provider which countries are considered high risk.  Your parents were born in a high-risk country, and you have not been immunized against hepatitis B (hepatitis B vaccine).  You have HIV or AIDS.  You use needles to inject street drugs.  You live with someone who has hepatitis B.  You have had sex with someone who has hepatitis B.  You get hemodialysis treatment.  You take certain medicines for conditions, including cancer, organ transplantation, and autoimmune conditions. Hepatitis C  Blood testing is recommended for:  Everyone born from [birth year] through 1963.  Anyone with known risk factors for hepatitis C. Sexually transmitted infections (STIs)  You should be screened for sexually transmitted infections (STIs) including gonorrhea and chlamydia if:  You are sexually active and are younger than 80 years of age.  You are older than 80 years of age and your health care provider tells you that you are at risk for this type of infection.  Your sexual activity has changed since you were last screened and you are at an increased risk for chlamydia or gonorrhea. Ask your health care provider if you are at risk.  If you do not have HIV, but are at risk, it may be recommended that you take a prescription medicine daily to prevent HIV infection. This is called pre-exposure prophylaxis (PrEP). You are considered at risk if:  You are sexually active and do not regularly use condoms or know the HIV status of your partner(s).  You take drugs by injection.  You are sexually  active with a partner who has HIV. Talk with your health care provider about whether you are at high risk of being infected with HIV. If you choose to begin PrEP, you should first be tested for HIV. You should then be tested every 3 months for as long as you are taking PrEP.  PREGNANCY   If you are premenopausal and you may become pregnant, ask your health care provider about preconception counseling.  If you may  become pregnant, take 400 to 800 micrograms (mcg) of folic acid every day.  If you want to prevent pregnancy, talk to your health care provider about birth control (contraception). OSTEOPOROSIS AND MENOPAUSE   Osteoporosis is a disease in which the bones lose minerals and strength with aging. This can result in serious bone fractures. Your risk for osteoporosis can be identified using a bone density scan.  If you are 10 years of age or older, or if you are at risk for osteoporosis and fractures, ask your health care provider if you should be screened.  Ask your health care provider whether you should take a calcium or vitamin D supplement to lower your risk for osteoporosis.  Menopause may have certain physical symptoms and risks.  Hormone replacement therapy may reduce some of these symptoms and risks. Talk to your health care provider about whether hormone replacement therapy is right for you.  HOME CARE INSTRUCTIONS   Schedule regular health, dental, and eye exams.  Stay current with your immunizations.   Do not use any tobacco products including cigarettes, chewing tobacco, or electronic cigarettes.  If you are pregnant, do not drink alcohol.  If you are breastfeeding, limit how much and how often you drink alcohol.  Limit alcohol intake to no more than 1 drink per day for nonpregnant women. One drink equals 12 ounces of beer, 5 ounces of wine, or 1 ounces of hard liquor.  Do not use street drugs.  Do not share needles.  Ask your health care provider for help if  you need support or information about quitting drugs.  Tell your health care provider if you often feel depressed.  Tell your health care provider if you have ever been abused or do not feel safe at home.   This information is not intended to replace advice given to you by your health care provider. Make sure you discuss any questions you have with your health care provider.   Document Released: 10/16/2010 Document Revised: 04/23/2014 Document Reviewed: 03/04/2013 Elsevier Interactive Patient Education 2016 [place] Maintenance, Female Adopting a healthy lifestyle and getting preventive care can go a long way to promote health and wellness. Talk with your health care provider about what schedule of regular examinations is right for you. This is a good chance for you to check in with your provider about disease prevention and staying healthy. In between checkups, there are plenty of things you can do on your own. Experts have done a lot of research about which lifestyle changes and preventive measures are most likely to keep you healthy. Ask your health care provider for more information. WEIGHT AND DIET  Eat a healthy diet  Be sure to include plenty of vegetables, fruits, low-fat dairy products, and lean protein.  Do not eat a lot of foods high in solid fats, added sugars, or salt.  Get regular exercise. This is one of the most important things you can do for your health.  Most adults should exercise for at least 150 minutes each week. The exercise should increase your heart rate and make you sweat (moderate-intensity exercise).  Most adults should also do strengthening exercises at least twice a week. This is in addition to the moderate-intensity exercise.  Maintain a healthy weight  Body mass index (BMI) is a measurement that can be used to identify possible weight problems. It estimates body fat based on height and weight. Your health care provider can help determine  your BMI and help you  achieve or maintain a healthy weight.  For females 32 years of age and older:   A BMI below 18.5 is considered underweight.  A BMI of 18.5 to 24.9 is normal.  A BMI of 25 to 29.9 is considered overweight.  A BMI of 30 and above is considered obese.  Watch levels of cholesterol and blood lipids  You should start having your blood tested for lipids and cholesterol at 80 years of age, then have this test every 5 years.  You may need to have your cholesterol levels checked more often if:  Your lipid or cholesterol levels are high.  You are older than 80 years of age.  You are at high risk for heart disease.  CANCER SCREENING   Lung Cancer  Lung cancer screening is recommended for adults 75-68 years old who are at high risk for lung cancer because of a history of smoking.  A yearly low-dose CT scan of the lungs is recommended for people who:  Currently smoke.  Have quit within the past 15 years.  Have at least a 30-pack-year history of smoking. A pack year is smoking an average of one pack of cigarettes a day for 1 year.  Yearly screening should continue until it has been 15 years since you quit.  Yearly screening should stop if you develop a health problem that would prevent you from having lung cancer treatment.  Breast Cancer  Practice breast self-awareness. This means understanding how your breasts normally appear and feel.  It also means doing regular breast self-exams. Let your health care provider know about any changes, no matter how small.  If you are in your 20s or 30s, you should have a clinical breast exam (CBE) by a health care provider every 1-3 years as part of a regular health exam.  If you are 89 or older, have a CBE every year. Also consider having a breast X-ray (mammogram) every year.  If you have a family history of breast cancer, talk to your health care provider about genetic screening.  If you are at high risk for breast  cancer, talk to your health care provider about having an MRI and a mammogram every year.  Breast cancer gene (BRCA) assessment is recommended for women who have family members with BRCA-related cancers. BRCA-related cancers include:  Breast.  Ovarian.  Tubal.  Peritoneal cancers.  Results of the assessment will determine the need for genetic counseling and BRCA1 and BRCA2 testing. Cervical Cancer Your health care provider may recommend that you be screened regularly for cancer of the pelvic organs (ovaries, uterus, and vagina). This screening involves a pelvic examination, including checking for microscopic changes to the surface of your cervix (Pap test). You may be encouraged to have this screening done every 3 years, beginning at age 45.  For women ages 22-65, health care providers may recommend pelvic exams and Pap testing every 3 years, or they may recommend the Pap and pelvic exam, combined with testing for human papilloma virus (HPV), every 5 years. Some types of HPV increase your risk of cervical cancer. Testing for HPV may also be done on women of any age with unclear Pap test results.  Other health care providers may not recommend any screening for nonpregnant women who are considered low risk for pelvic cancer and who do not have symptoms. Ask your health care provider if a screening pelvic exam is right for you.  If you have had past treatment for cervical cancer or a condition  that could lead to cancer, you need Pap tests and screening for cancer for at least 20 years after your treatment. If Pap tests have been discontinued, your risk factors (such as having a new sexual partner) need to be reassessed to determine if screening should resume. Some women have medical problems that increase the chance of getting cervical cancer. In these cases, your health care provider may recommend more frequent screening and Pap tests. Colorectal Cancer  This type of cancer can be detected and  often prevented.  Routine colorectal cancer screening usually begins at 80 years of age and continues through 80 years of age.  Your health care provider may recommend screening at an earlier age if you have risk factors for colon cancer.  Your health care provider may also recommend using home test kits to check for hidden blood in the stool.  A small camera at the end of a tube can be used to examine your colon directly (sigmoidoscopy or colonoscopy). This is done to check for the earliest forms of colorectal cancer.  Routine screening usually begins at age 57.  Direct examination of the colon should be repeated every 5-10 years through 79 years of age. However, you may need to be screened more often if early forms of precancerous polyps or small growths are found. Skin Cancer  Check your skin from head to toe regularly.  Tell your health care provider about any new moles or changes in moles, especially if there is a change in a mole's shape or color.  Also tell your health care provider if you have a mole that is larger than the size of a pencil eraser.  Always use sunscreen. Apply sunscreen liberally and repeatedly throughout the day.  Protect yourself by wearing long sleeves, pants, a wide-brimmed hat, and sunglasses whenever you are outside. HEART DISEASE, DIABETES, AND HIGH BLOOD PRESSURE   High blood pressure causes heart disease and increases the risk of stroke. High blood pressure is more likely to develop in:  People who have blood pressure in the high end of the normal range (130-139/85-89 mm Hg).  People who are overweight or obese.  People who are African American.  If you are 71-45 years of age, have your blood pressure checked every 3-5 years. If you are 74 years of age or older, have your blood pressure checked every year. You should have your blood pressure measured twice--once when you are at a hospital or clinic, and once when you are not at a hospital or clinic.  Record the average of the two measurements. To check your blood pressure when you are not at a hospital or clinic, you can use:  An automated blood pressure machine at a pharmacy.  A home blood pressure monitor.  If you are between 60 years and 22 years old, ask your health care provider if you should take aspirin to prevent strokes.  Have regular diabetes screenings. This involves taking a blood sample to check your fasting blood sugar level.  If you are at a normal weight and have a low risk for diabetes, have this test once every three years after 80 years of age.  If you are overweight and have a high risk for diabetes, consider being tested at a younger age or more often. PREVENTING INFECTION  Hepatitis B  If you have a higher risk for hepatitis B, you should be screened for this virus. You are considered at high risk for hepatitis B if:  You were born  in a country where hepatitis B is common. Ask your health care provider which countries are considered high risk.  Your parents were born in a high-risk country, and you have not been immunized against hepatitis B (hepatitis B vaccine).  You have HIV or AIDS.  You use needles to inject street drugs.  You live with someone who has hepatitis B.  You have had sex with someone who has hepatitis B.  You get hemodialysis treatment.  You take certain medicines for conditions, including cancer, organ transplantation, and autoimmune conditions. Hepatitis C  Blood testing is recommended for:  Everyone born from [birth year] through 1963.  Anyone with known risk factors for hepatitis C. Sexually transmitted infections (STIs)  You should be screened for sexually transmitted infections (STIs) including gonorrhea and chlamydia if:  You are sexually active and are younger than 80 years of age.  You are older than 80 years of age and your health care provider tells you that you are at risk for this type of infection.  Your sexual activity  has changed since you were last screened and you are at an increased risk for chlamydia or gonorrhea. Ask your health care provider if you are at risk.  If you do not have HIV, but are at risk, it may be recommended that you take a prescription medicine daily to prevent HIV infection. This is called pre-exposure prophylaxis (PrEP). You are considered at risk if:  You are sexually active and do not regularly use condoms or know the HIV status of your partner(s).  You take drugs by injection.  You are sexually active with a partner who has HIV. Talk with your health care provider about whether you are at high risk of being infected with HIV. If you choose to begin PrEP, you should first be tested for HIV. You should then be tested every 3 months for as long as you are taking PrEP.  PREGNANCY   If you are premenopausal and you may become pregnant, ask your health care provider about preconception counseling.  If you may become pregnant, take 400 to 800 micrograms (mcg) of folic acid every day.  If you want to prevent pregnancy, talk to your health care provider about birth control (contraception). OSTEOPOROSIS AND MENOPAUSE   Osteoporosis is a disease in which the bones lose minerals and strength with aging. This can result in serious bone fractures. Your risk for osteoporosis can be identified using a bone density scan.  If you are 80 years of age or older, or if you are at risk for osteoporosis and fractures, ask your health care provider if you should be screened.  Ask your health care provider whether you should take a calcium or vitamin D supplement to lower your risk for osteoporosis.  Menopause may have certain physical symptoms and risks.  Hormone replacement therapy may reduce some of these symptoms and risks. Talk to your health care provider about whether hormone replacement therapy is right for you.  HOME CARE INSTRUCTIONS   Schedule regular health, dental, and eye  exams.  Stay current with your immunizations.   Do not use any tobacco products including cigarettes, chewing tobacco, or electronic cigarettes.  If you are pregnant, do not drink alcohol.  If you are breastfeeding, limit how much and how often you drink alcohol.  Limit alcohol intake to no more than 1 drink per day for nonpregnant women. One drink equals 12 ounces of beer, 5 ounces of wine, or 1 ounces of hard liquor.  Do not use street drugs.  Do not share needles.  Ask your health care provider for help if you need support or information about quitting drugs.  Tell your health care provider if you often feel depressed.  Tell your health care provider if you have ever been abused or do not feel safe at home.   This information is not intended to replace advice given to you by your health care provider. Make sure you discuss any questions you have with your health care provider.   Document Released: 10/16/2010 Document Revised: 04/23/2014 Document Reviewed: 03/04/2013 [employer] Interactive Patient Education 2016 [employer] [employer] Eczema Eczema, also called atopic dermatitis, is a skin disorder that causes inflammation of the skin. It causes a red rash and dry, scaly skin. The skin becomes very itchy. Eczema is generally worse during the cooler winter months and often improves with the warmth of summer. Eczema usually starts showing signs in infancy. Some children outgrow eczema, but it may last through adulthood.  CAUSES  The exact cause of eczema is not known, but it appears to run in families. People with eczema often have a family history of eczema, allergies, asthma, or hay fever. Eczema is not contagious. Flare-ups of the condition may be caused by:   Contact with something you are sensitive or allergic to.   Stress. SIGNS AND SYMPTOMS  Dry, scaly skin.   Red, itchy rash.   Itchiness. This may occur before the skin rash and may be very intense.  DIAGNOSIS  The  diagnosis of eczema is usually made based on symptoms and medical history. TREATMENT  Eczema cannot be cured, but symptoms usually can be controlled with treatment and other strategies. A treatment plan might include:  Controlling the itching and scratching.   Use over-the-counter antihistamines as directed for itching. This is especially useful at night when the itching tends to be worse.   Use over-the-counter steroid creams as directed for itching.   Avoid scratching. Scratching makes the rash and itching worse. It may also result in a skin infection (impetigo) due to a break in the skin caused by scratching.   Keeping the skin well moisturized with creams every day. This will seal in moisture and help prevent dryness. Lotions that contain alcohol and water should be avoided because they can dry the skin.   Limiting exposure to things that you are sensitive or allergic to (allergens).   Recognizing situations that cause stress.   Developing a plan to manage stress.  HOME CARE INSTRUCTIONS   Only take over-the-counter or prescription medicines as directed by your health care provider.   Do not use anything on the skin without checking with your health care provider.   Keep baths or showers short (5 minutes) in warm (not hot) water. Use mild cleansers for bathing. These should be unscented. You may add nonperfumed bath oil to the bath water. It is best to avoid soap and bubble bath.   Immediately after a bath or shower, when the skin is still damp, apply a moisturizing ointment to the entire body. This ointment should be a petroleum ointment. This will seal in moisture and help prevent dryness. The thicker the ointment, the better. These should be unscented.   Keep fingernails cut short. Children with eczema may need to wear soft gloves or mittens at night after applying an ointment.   Dress in clothes made of cotton or cotton blends. Dress lightly, because heat increases  itching.   A child with eczema should  stay away from anyone with fever blisters or cold sores. The virus that causes fever blisters (herpes simplex) can cause a serious skin infection in children with eczema. SEEK MEDICAL CARE IF:   Your itching interferes with sleep.   Your rash gets worse or is not better within 1 week after starting treatment.   You see pus or soft yellow scabs in the rash area.   You have a fever.   You have a rash flare-up after contact with someone who has fever blisters.    This information is not intended to replace advice given to you by your health care provider. Make sure you discuss any questions you have with your health care provider.   Document Released: 03/30/2000 Document Revised: 01/21/2013 Document Reviewed: 11/03/2012 Elsevier Interactive Patient Education [employer].

## 2015-09-27 ENCOUNTER — Other Ambulatory Visit: Payer: Self-pay | Admitting: Family Medicine

## 2015-09-29 ENCOUNTER — Other Ambulatory Visit: Payer: Self-pay | Admitting: Family

## 2015-10-03 ENCOUNTER — Telehealth: Payer: Self-pay

## 2015-10-03 ENCOUNTER — Other Ambulatory Visit: Payer: Self-pay | Admitting: Family

## 2015-10-03 MED ORDER — CYPROHEPTADINE HCL 4 MG PO TABS: ORAL_TABLET | ORAL | Status: DC

## 2015-10-03 NOTE — Telephone Encounter (Signed)
Patient not eating and feeling weak. Daughter would like appetite stimulant. Please send to [facility]. Daughter is already aware that you will send

## 2015-10-04 ENCOUNTER — Telehealth: Payer: Self-pay

## 2015-10-04 NOTE — Telephone Encounter (Signed)
Decreased appetite, weakness

## 2015-10-04 NOTE — Telephone Encounter (Signed)
Insurance needing prior auth for Cyproheptadine   What dx do I use for this?

## 2015-10-07 ENCOUNTER — Telehealth: Payer: Self-pay

## 2015-10-07 NOTE — Telephone Encounter (Signed)
Insurance denied authorization for Cyrproheptadine HCL  Drugs used for anorexia ,weight loss or weight gain are excluded from coverage under Medicare rules

## 2015-10-07 NOTE — Telephone Encounter (Signed)
Pt aware.

## 2015-10-07 NOTE — Telephone Encounter (Signed)
Drugs used for anorexia ,weight loss or weight gain are excluded from coverage under Medicare rules     Please let family know. Thanks

## 2015-10-11 ENCOUNTER — Telehealth: Payer: Self-pay | Admitting: *Deleted

## 2015-10-11 ENCOUNTER — Emergency Department (HOSPITAL_COMMUNITY): Payer: Medicare Other

## 2015-10-11 ENCOUNTER — Encounter (HOSPITAL_COMMUNITY): Payer: Self-pay | Admitting: Emergency Medicine

## 2015-10-11 ENCOUNTER — Observation Stay (HOSPITAL_COMMUNITY)
Admission: EM | Admit: 2015-10-11 | Discharge: 2015-10-14 | Disposition: A | Discharge status: Home / Self Care | Payer: Medicare Other | Attending: Internal Medicine | Admitting: Internal Medicine

## 2015-10-11 DIAGNOSIS — N179 Acute kidney failure, unspecified: Principal | ICD-10-CM

## 2015-10-11 DIAGNOSIS — E860 Dehydration: Secondary | ICD-10-CM | POA: Diagnosis present

## 2015-10-11 DIAGNOSIS — G9519 Other vascular myelopathies: Secondary | ICD-10-CM | POA: Insufficient documentation

## 2015-10-11 DIAGNOSIS — Z79899 Other long term (current) drug therapy: Secondary | ICD-10-CM | POA: Diagnosis not present

## 2015-10-11 DIAGNOSIS — R55 Syncope and collapse: Secondary | ICD-10-CM | POA: Diagnosis not present

## 2015-10-11 DIAGNOSIS — Z7982 Long term (current) use of aspirin: Secondary | ICD-10-CM | POA: Diagnosis not present

## 2015-10-11 DIAGNOSIS — R531 Weakness: Secondary | ICD-10-CM

## 2015-10-11 DIAGNOSIS — N183 Chronic kidney disease, stage 3 unspecified: Secondary | ICD-10-CM | POA: Diagnosis present

## 2015-10-11 DIAGNOSIS — M48062 Spinal stenosis, lumbar region with neurogenic claudication: Secondary | ICD-10-CM | POA: Insufficient documentation

## 2015-10-11 DIAGNOSIS — I10 Essential (primary) hypertension: Secondary | ICD-10-CM | POA: Diagnosis not present

## 2015-10-11 DIAGNOSIS — R627 Adult failure to thrive: Secondary | ICD-10-CM | POA: Diagnosis not present

## 2015-10-11 DIAGNOSIS — I959 Hypotension, unspecified: Secondary | ICD-10-CM | POA: Diagnosis present

## 2015-10-11 DIAGNOSIS — R569 Unspecified convulsions: Secondary | ICD-10-CM

## 2015-10-11 DIAGNOSIS — I9589 Other hypotension: Secondary | ICD-10-CM

## 2015-10-11 DIAGNOSIS — G40909 Epilepsy, unspecified, not intractable, without status epilepticus: Secondary | ICD-10-CM

## 2015-10-11 DIAGNOSIS — I639 Cerebral infarction, unspecified: Secondary | ICD-10-CM | POA: Insufficient documentation

## 2015-10-11 DIAGNOSIS — R609 Edema, unspecified: Secondary | ICD-10-CM | POA: Insufficient documentation

## 2015-10-11 LAB — URINALYSIS, ROUTINE W REFLEX MICROSCOPIC
Bilirubin Urine: NEGATIVE
GLUCOSE, UA: NEGATIVE mg/dL
HGB URINE DIPSTICK: NEGATIVE
Ketones, ur: NEGATIVE mg/dL
LEUKOCYTES UA: NEGATIVE
Nitrite: NEGATIVE
PH: 5.5 (ref 5.0–8.0)
Protein, ur: NEGATIVE mg/dL
Specific Gravity, Urine: 1.01 (ref 1.005–1.030)

## 2015-10-11 LAB — COMPREHENSIVE METABOLIC PANEL
ALBUMIN: 4.4 g/dL (ref 3.5–5.0)
ALK PHOS: 33 U/L — AB (ref 38–126)
ALT: 15 U/L (ref 14–54)
AST: 20 U/L (ref 15–41)
Anion gap: 12 (ref 5–15)
BUN: 98 mg/dL — AB (ref 6–20)
CALCIUM: 10.3 mg/dL (ref 8.9–10.3)
CO2: 27 mmol/L (ref 22–32)
CREATININE: 4.1 mg/dL — AB (ref 0.44–1.00)
Chloride: 96 mmol/L — ABNORMAL LOW (ref 101–111)
GFR calc Af Amer: 10 mL/min — ABNORMAL LOW (ref >60)
GFR calc non Af Amer: 9 mL/min — ABNORMAL LOW (ref >60)
GLUCOSE: 91 mg/dL (ref 65–99)
Potassium: 4.2 mmol/L (ref 3.5–5.1)
SODIUM: 135 mmol/L (ref 135–145)
Total Bilirubin: 0.5 mg/dL (ref 0.3–1.2)
Total Protein: 7.7 g/dL (ref 6.5–8.1)

## 2015-10-11 LAB — CBC
HEMATOCRIT: 30.3 % — AB (ref 36.0–46.0)
Hemoglobin: 10.1 g/dL — ABNORMAL LOW (ref 12.0–15.0)
MCH: 24.4 pg — AB (ref 26.0–34.0)
MCHC: 33.3 g/dL (ref 30.0–36.0)
MCV: 73.2 fL — AB (ref 78.0–100.0)
Platelets: 151 10*3/uL (ref 150–400)
RBC: 4.14 MIL/uL (ref 3.87–5.11)
RDW: 14 % (ref 11.5–15.5)
WBC: 7.4 10*3/uL (ref 4.0–10.5)

## 2015-10-11 LAB — CBG MONITORING, ED: Glucose-Capillary: 96 mg/dL (ref 65–99)

## 2015-10-11 LAB — TSH: TSH: 3.451 u[IU]/mL (ref 0.350–4.500)

## 2015-10-11 LAB — TROPONIN I: Troponin I: <0.03 ng/mL (ref <0.03)

## 2015-10-11 MED ORDER — LORAZEPAM 2 MG/ML IJ SOLN
1.0000 mg | Freq: Once | INTRAMUSCULAR | Status: AC
  Administered 2015-10-12: 1 mg via INTRAVENOUS
  Filled 2015-10-11: qty 1

## 2015-10-11 MED ORDER — ACETAMINOPHEN 325 MG PO TABS
650.0000 mg | ORAL_TABLET | Freq: Four times a day (QID) | ORAL | Status: DC | PRN
  Administered 2015-10-14: 650 mg via ORAL
  Filled 2015-10-11 (×2): qty 2

## 2015-10-11 MED ORDER — FEBUXOSTAT 40 MG PO TABS
80.0000 mg | ORAL_TABLET | Freq: Every day | ORAL | Status: DC
  Administered 2015-10-12 – 2015-10-14 (×3): 80 mg via ORAL
  Filled 2015-10-11 (×6): qty 2

## 2015-10-11 MED ORDER — ALBUTEROL SULFATE (2.5 MG/3ML) 0.083% IN NEBU: 3.0000 mL | INHALATION_SOLUTION | Freq: Four times a day (QID) | RESPIRATORY_TRACT | Status: DC | PRN

## 2015-10-11 MED ORDER — ASPIRIN 81 MG PO CHEW
81.0000 mg | CHEWABLE_TABLET | Freq: Every day | ORAL | Status: DC
  Administered 2015-10-12 – 2015-10-14 (×3): 81 mg via ORAL
  Filled 2015-10-11 (×4): qty 1

## 2015-10-11 MED ORDER — NYSTATIN-TRIAMCINOLONE 100000-0.1 UNIT/GM-% EX OINT
1.0000 "application " | TOPICAL_OINTMENT | Freq: Two times a day (BID) | CUTANEOUS | Status: DC
  Administered 2015-10-11: 1 via TOPICAL
  Filled 2015-10-11: qty 15

## 2015-10-11 MED ORDER — SODIUM CHLORIDE 0.9 % IV BOLUS (SEPSIS)
1000.0000 mL | Freq: Once | INTRAVENOUS | Status: AC
  Administered 2015-10-11: 1000 mL via INTRAVENOUS

## 2015-10-11 MED ORDER — NYSTATIN 100000 UNIT/GM EX POWD
Freq: Three times a day (TID) | CUTANEOUS | Status: DC
  Administered 2015-10-11 – 2015-10-14 (×7): via TOPICAL
  Filled 2015-10-11: qty 15

## 2015-10-11 MED ORDER — CYPROHEPTADINE HCL 4 MG PO TABS
4.0000 mg | ORAL_TABLET | Freq: Four times a day (QID) | ORAL | Status: DC
  Administered 2015-10-11 – 2015-10-14 (×9): 4 mg via ORAL
  Filled 2015-10-11 (×23): qty 1

## 2015-10-11 MED ORDER — LEVETIRACETAM 500 MG PO TABS
ORAL_TABLET | ORAL | Status: AC
  Filled 2015-10-11: qty 3

## 2015-10-11 MED ORDER — ALBUTEROL SULFATE (2.5 MG/3ML) 0.083% IN NEBU: 2.5000 mg | INHALATION_SOLUTION | Freq: Every evening | RESPIRATORY_TRACT | Status: DC | PRN

## 2015-10-11 MED ORDER — POLYETHYLENE GLYCOL 3350 17 G PO PACK: 17.0000 g | PACK | Freq: Every day | ORAL | Status: DC | PRN

## 2015-10-11 MED ORDER — SODIUM CHLORIDE 0.9 % IV SOLN
INTRAVENOUS | Status: DC
  Administered 2015-10-11: 21:00:00 via INTRAVENOUS

## 2015-10-11 MED ORDER — PHENOBARBITAL 97.2 MG PO TABS
97.2000 mg | ORAL_TABLET | Freq: Every day | ORAL | Status: DC
  Administered 2015-10-12 – 2015-10-14 (×3): 97.2 mg via ORAL
  Filled 2015-10-11 (×4): qty 1

## 2015-10-11 MED ORDER — GABAPENTIN 100 MG PO CAPS
100.0000 mg | ORAL_CAPSULE | Freq: Three times a day (TID) | ORAL | Status: DC
  Administered 2015-10-11 – 2015-10-14 (×7): 100 mg via ORAL
  Filled 2015-10-11 (×8): qty 1

## 2015-10-11 MED ORDER — LEVETIRACETAM ER 500 MG PO TB24
1500.0000 mg | ORAL_TABLET | Freq: Every day | ORAL | Status: DC
  Administered 2015-10-11 – 2015-10-13 (×3): 1500 mg via ORAL
  Filled 2015-10-11 (×6): qty 3

## 2015-10-11 MED ORDER — ACETAMINOPHEN 650 MG RE SUPP: 650.0000 mg | Freq: Four times a day (QID) | RECTAL | Status: DC | PRN

## 2015-10-11 MED ORDER — NYSTATIN 100000 UNIT/GM EX POWD
CUTANEOUS | Status: AC
  Filled 2015-10-11: qty 15

## 2015-10-11 MED ORDER — ENOXAPARIN SODIUM 30 MG/0.3ML ~~LOC~~ SOLN
30.0000 mg | SUBCUTANEOUS | Status: DC
  Administered 2015-10-12 – 2015-10-13 (×2): 30 mg via SUBCUTANEOUS
  Filled 2015-10-11 (×2): qty 0.3

## 2015-10-11 NOTE — Telephone Encounter (Signed)
Patients BP was 68/47 and she was complaining with feeling weak, blurred vision and chest pain. Patients daughter advised that she should call 911 and have patient transported to the ER for evaluation.

## 2015-10-11 NOTE — ED Notes (Addendum)
Went to move pt to room and pt having labs drawn currently.   Will room when she returns.

## 2015-10-11 NOTE — H&P (Signed)
History and Physical  [name] [id number] DOB: 09-01-1923 DOA: 10/11/2015  Referring physician: Dr Sabra Heck, ED physician PCP: [name], [name]  Outpatient Specialists:   Dr Jannifer Franklin (Neuro)  Chief Complaint: Weakness  HPI: [name] is a 82 y.o. female with a history of seizure disorder, hypertension, history of stroke, obesity, degenerative arthritis, peripheral edema on diuretics. Patient presents with 40 pound weight loss over the past year due to decreased oral intake of both food and liquid and weakness with hypotension earlier today. Patient was out shopping with her daughter and when she returned home she felt very weak and laid on the bed. Her daughter took her blood pressure and found it to be 60/40. She was brought to the emergency department for evaluation. She was given a couple liters of fluid, which improved her blood pressure. Patient would feel dizzy and lightheaded when standing. No fevers, chills, nausea, vomiting. She has been on an appetite stimulant, but this hasn't helped much for her weight loss.   Review of Systems:   Pt denies any fevers, chills, nausea, vomiting, diarrhea, constipation, abdominal pain, shortness of breath, dyspnea on exertion, orthopnea, cough, wheezing, palpitations, headache, vision changes, lightheadedness, dizziness, melena, rectal bleeding.  Review of systems are otherwise negative  Past Medical History  Diagnosis Date  . Hypertension   . Hiatal hernia   . Stroke ([place])   . Edema   . Neurogenic claudication   . Seizures ([place])   . Internal hemorrhoids   . Eczema   . Obesity   . Seizure disorder ([place])   . Headache   . Gout   . Degenerative arthritis   . Chronic low back pain    Past Surgical History  Procedure Laterality Date  . Bilateral knees    . Appendectomy    . Mammoplasty reduction    . Neuroplasty / transposition median nerve at carpal tunnel bilateral    . Tubal ligation    . Cataracts    . Cholecystectomy     . Abdominal hysterectomy    . Appendectomy    . Re-excision of breast lumpectomy     Social History:  reports that she has never smoked. She has never used smokeless tobacco. She reports that she does not drink alcohol or use illicit drugs. Patient lives at [place]  . Advicor [Niacin-Lovastatin Er]   . Caduet [Amlodipine-Atorvastatin]   . Codeine   . Dilantin [Phenytoin]   . Lescol [Fluvastatin]   . Pamelor [Nortriptyline]   . Penicillins   . Sulfa Antibiotics   . Trileptal [Oxcarbazepine]   . Ultram [Tramadol]   . Welchol [Colesevelam]     Family History  Problem Relation Age of Onset  . Diabetes Mother   . Heart disease Mother   . Heart disease Father   . Cancer Sister   . Cancer Brother   . Cancer Sister      Prior to Admission medications   Medication Sig Start Date End Date Taking? Authorizing Provider  acyclovir (ZOVIRAX) 800 MG tablet TAKE 1 TABLET (800 MG TOTAL) BY MOUTH 5 (FIVE) TIMES DAILY. 06/22/15   Chipper Herb, MD  albuterol (PROVENTIL HFA;VENTOLIN HFA) 108 (90 BASE) MCG/ACT inhaler Inhale 2 puffs into the lungs every 6 (six) hours as needed for wheezing or shortness of breath. 01/03/15   Chipper Herb, MD  albuterol (PROVENTIL) (2.5 MG/3ML) 0.083% nebulizer solution Take 3 mLs (2.5 mg total) by nebulization at bedtime as needed for  wheezing or shortness of breath. 01/03/15   Chipper Herb, MD  aspirin 81 MG tablet Take 81 mg by mouth daily.    Historical Provider, MD  cyproheptadine (PERIACTIN) 4 MG tablet Start 2 mg QID then increase by 2 mg weekly until 8 mg qid 10/03/15   [name], [name]  ferrous sulfate 325 (65 FE) MG tablet Take 325 mg by mouth daily with breakfast.    Historical Provider, MD  furosemide (LASIX) 80 MG tablet TAKE 1 TABLET IN THE MORNING AND 1 TABLET AT NOON DAILY 09/15/15   [name], [name]  gabapentin (NEURONTIN) 100 MG capsule Take 1 capsule (100 mg total) by mouth 3 (three) times daily. 03/29/14    Kathrynn Ducking, MD  hydrOXYzine (ATARAX/VISTARIL) 50 MG tablet TAKE 1 TABLET (50 MG TOTAL) BY MOUTH EVERY 8 (EIGHT) HOURS AS NEEDED FOR ITCHING. 09/30/15   [name], [name]  levETIRAcetam (KEPPRA XR) 500 MG 24 hr tablet Take 3 tablets (1,500 mg total) by mouth at bedtime. 07/07/15   Kathrynn Ducking, MD  levETIRAcetam (KEPPRA) 1000 MG tablet Reported on 09/16/2015 06/28/15   Historical Provider, MD  metolazone (ZAROXOLYN) 2.5 MG tablet TAKE 1 TABLET ON MON, WED, AND FRIDAY 09/27/15   Chipper Herb, MD  Multiple Vitamins-Minerals (MULTIVITAMIN WITH MINERALS) tablet Take 1 tablet by mouth daily.    Historical Provider, MD  nystatin (NYSTATIN) powder Apply topically 3 (three) times daily. 09/16/15   [name], [name]  nystatin-triamcinolone ointment (MYCOLOG) Apply 1 application topically 2 (two) times daily. 09/16/15   [name], [name]  olmesartan (BENICAR) 20 MG tablet TAKE 1 TABLET (20 MG TOTAL) BY MOUTH DAILY. 09/13/15   Chipper Herb, MD  PHENobarbital (LUMINAL) 97.2 MG tablet TAKE 1 TABLET EVERY DAY 05/30/15   Chipper Herb, MD  triamcinolone cream (KENALOG) 0.1 % APPLY 1 APPLICATION ON THE SKIN TWICE A DAY AS NEEDED (PRN) 09/06/15   Historical Provider, MD  ULORIC 80 MG TABS TAKE 1 TABLET BY MOUTH EVERY DAY 08/22/15   Chipper Herb, MD    Physical Exam: BP 100/53 mmHg  Pulse 86  Temp(Src) 98.6 F (37 C) (Oral)  Resp 13  Ht 5\' 6"  (1.676 m)  Wt 107.956 kg (238 lb)  BMI 38.43 kg/m2  SpO2 100%  General: Elderly black female. Awake and alert and oriented x3. No acute cardiopulmonary distress.  HEENT: Normocephalic atraumatic.  Right and left ears normal in appearance.  Pupils equal, round, reactive to light. Extraocular muscles are intact. Sclerae anicteric and noninjected.  Moist mucosal membranes. No mucosal lesions.  Neck: Neck supple without lymphadenopathy. No carotid bruits. No masses palpated.  Cardiovascular: Regular rate with normal S1-S2 sounds. No murmurs, rubs, gallops  auscultated. No JVD.  Respiratory: Good respiratory effort with no wheezes, rales, rhonchi. Lungs clear to auscultation bilaterally.  No accessory muscle use. Abdomen: Soft, nontender, nondistended. Active bowel sounds. No masses or hepatosplenomegaly  Skin: No rashes, lesions, or ulcerations.  Dry, warm to touch. 2+ dorsalis pedis and radial pulses. Musculoskeletal: No calf or leg pain. All major joints not erythematous nontender.  No upper or lower joint deformation.  Good ROM.  No contractures  Psychiatric: Intact judgment and insight. Pleasant and cooperative. Neurologic: No focal neurological deficits. Strength is 5/5 and symmetric in upper and lower extremities.  Cranial nerves II through XII are grossly intact.           Labs on Admission: I have personally reviewed following  labs and imaging studies  CBC:  Recent Labs Lab 10/11/15 1545  WBC 7.4  HGB 10.1*  HCT 30.3*  MCV 73.2*  PLT 123XX123   Basic Metabolic Panel:  Recent Labs Lab 10/11/15 1545  NA 135  K 4.2  CL 96*  CO2 27  GLUCOSE 91  BUN 98*  CREATININE 4.10*  CALCIUM 10.3   GFR: Estimated Creatinine Clearance: 11.3 mL/min (by C-G formula based on Cr of 4.1). Liver Function Tests:  Recent Labs Lab 10/11/15 1545  AST 20  ALT 15  ALKPHOS 33*  BILITOT 0.5  PROT 7.7  ALBUMIN 4.4   No results for input(s): LIPASE, AMYLASE in the last 168 hours. No results for input(s): AMMONIA in the last 168 hours. Coagulation Profile: No results for input(s): INR, PROTIME in the last 168 hours. Cardiac Enzymes:  Recent Labs Lab 10/11/15 1545  TROPONINI <0.03   BNP (last 3 results) No results for input(s): PROBNP in the last 8760 hours. HbA1C: No results for input(s): HGBA1C in the last 72 hours. CBG:  Recent Labs Lab 10/11/15 1611  GLUCAP 96   Lipid Profile: No results for input(s): CHOL, HDL, LDLCALC, TRIG, CHOLHDL, LDLDIRECT in the last 72 hours. Thyroid Function Tests:  Recent Labs  10/11/15 1545   TSH 3.451   Anemia Panel: No results for input(s): VITAMINB12, FOLATE, FERRITIN, TIBC, IRON, RETICCTPCT in the last 72 hours. Urine analysis:    Component Value Date/Time   BILIRUBINUR neg 03/09/2015 1652   PROTEINUR neg 03/09/2015 1652   UROBILINOGEN negative 03/09/2015 1652   NITRITE neg 03/09/2015 1652   LEUKOCYTESUR Trace* 03/09/2015 1652   Sepsis Labs: @LABRCNTIP (procalcitonin:4,lacticidven:4) )No results found for this or any previous visit (from the past 240 hour(s)).   Radiological Exams on Admission: Ct Head Wo Contrast  10/11/2015  CLINICAL DATA:  Syncope EXAM: CT HEAD WITHOUT CONTRAST TECHNIQUE: Contiguous axial images were obtained from the base of the skull through the vertex without intravenous contrast. COMPARISON:  06/08/2014 FINDINGS: Brain: No intracranial hemorrhage, mass effect or midline shift. No acute cortical infarction. No mass lesion is noted on this unenhanced scan. Vascular: Minimal atherosclerotic calcifications of carotid siphon. Skull: No skull fracture. Sinuses/Orbits: Paranasal sinuses and mastoid air cells are unremarkable. Other: None IMPRESSION: No acute intracranial abnormality. Electronically Signed   By: Lahoma Crocker M.D.   On: 10/11/2015 18:04    EKG: Independently reviewed. Sinus rhythm with borderline left axis deviation. No acute ST elevation or depression. Voltage is low.  Assessment/Plan: Principal Problem:   Acute renal failure superimposed on stage 3 chronic kidney disease (HCC) Active Problems:   Seizures ([place])   Dehydration   Failure to thrive in adult   Hypotension    This patient was discussed with the ED physician, including pertinent vitals, physical exam findings, labs, and imaging.  We also discussed care given by the ED provider.  #1 acute renal failure superimposed on stage III chronic kidney disease  Observation overnight  Recheck metabolic panel in the morning  We'll run IV fluids at 100 mL per hour. Patient has  received 2 L of fluid bolus in the emergency department #2 Dehydration  Continue rehydration as above  Hold diuretics #3 failure to thrive in adult  Nutrition consult  Patient is not extremely malnourished given her albumin and protein levels are normal  Check vitamin 0000000 and folic acid  TSH and UA are normal #4 hypotension  Hold antihypertensives #5 seizure disorder  Continue antiepileptics  DVT prophylaxis: Lovenox Consultants: Nutritional  services Code Status: Full code Family Communication: None  Disposition Plan: Observation with likely discharge to home tomorrow   Truett Mainland, DO [facility] Pager [phone]  If 7PM-7AM, please contact night-coverage [email or web address] Password [email or web address]

## 2015-10-11 NOTE — ED Notes (Signed)
Pt states she has not felt good for "a while now" states she has just been very weak and fatigued.  Worse today after trying to go out with her daughter.

## 2015-10-11 NOTE — ED Provider Notes (Signed)
CSN: [id number]     Arrival date & time 10/11/15  1444 History   First MD Initiated Contact with Patient 10/11/15 1457     Chief Complaint  Patient presents with  . Weakness     (Consider location/radiation/quality/duration/timing/severity/associated sxs/prior Treatment) HPI  The patient is a 82 year old female, she currently lives by herself but lives near a family member, her daughter, who checks on her daily. The patient did have breakfast this morning which was a sausage and egg biscuit, she ate the whole thing, drank some coffee and was in the car with her daughter running some errands today. The patient reports that she has had approximately 100 pound weight loss since February, she has not had an appetite, she has been rapidly losing weight and does not drink very much fluid. In addition to that the patient has a history of peripheral edema for which she takes both Lasix and Zaroxolyn. She does have a history of stroke as well as hypertension as well as seizure disorder. The daughter reports that after they came in from the house the patient was feeling weak, she took her blood pressure and it was 60/40. This occurred immediately after the patient had what appeared to be a syncopal episode after trying to lean over and pick up some lotion from the floor. The daughter reports that it lasted a short time, there was no postictal period, there is no witnessed seizure activity. The patient does not have memory of this. The patient does report that she has been having some intermittent chest pains which she describes as acid reflux or heartburn  Past Medical History  Diagnosis Date  . Hypertension   . Hiatal hernia   . Stroke ([place])   . Edema   . Neurogenic claudication   . Seizures ([place])   . Internal hemorrhoids   . Eczema   . Obesity   . Seizure disorder ([place])   . Headache   . Gout   . Degenerative arthritis   . Chronic low back pain    Past Surgical History  Procedure Laterality  Date  . Bilateral knees    . Appendectomy    . Mammoplasty reduction    . Neuroplasty / transposition median nerve at carpal tunnel bilateral    . Tubal ligation    . Cataracts    . Cholecystectomy    . Abdominal hysterectomy    . Appendectomy    . Re-excision of breast lumpectomy     Family History  Problem Relation Age of Onset  . Diabetes Mother   . Heart disease Mother   . Heart disease Father   . Cancer Sister   . Cancer Brother   . Cancer Sister    Social History  Substance Use Topics  . Smoking status: Never Smoker   . Smokeless tobacco: Never Used  . Alcohol Use: No   OB History    No data available     Review of Systems  All other systems reviewed and are negative.     Allergies  Advicor; Caduet; Codeine; Dilantin; Lescol; Pamelor; Penicillins; Sulfa antibiotics; Trileptal; Ultram; and Welchol  Home Medications   Prior to Admission medications   Medication Sig Start Date End Date Taking? Authorizing Provider  acyclovir (ZOVIRAX) 800 MG tablet TAKE 1 TABLET (800 MG TOTAL) BY MOUTH 5 (FIVE) TIMES DAILY. 06/22/15   Chipper Herb, MD  albuterol (PROVENTIL HFA;VENTOLIN HFA) 108 (90 BASE) MCG/ACT inhaler Inhale 2 puffs into the lungs every 6 (six)  hours as needed for wheezing or shortness of breath. 01/03/15   Chipper Herb, MD  albuterol (PROVENTIL) (2.5 MG/3ML) 0.083% nebulizer solution Take 3 mLs (2.5 mg total) by nebulization at bedtime as needed for wheezing or shortness of breath. 01/03/15   Chipper Herb, MD  aspirin 81 MG tablet Take 81 mg by mouth daily.    Historical Provider, MD  cyproheptadine (PERIACTIN) 4 MG tablet Start 2 mg QID then increase by 2 mg weekly until 8 mg qid 10/03/15   [name], [name]  ferrous sulfate 325 (65 FE) MG tablet Take 325 mg by mouth daily with breakfast.    Historical Provider, MD  furosemide (LASIX) 80 MG tablet TAKE 1 TABLET IN THE MORNING AND 1 TABLET AT NOON DAILY 09/15/15   [name], [name]  gabapentin (NEURONTIN)  100 MG capsule Take 1 capsule (100 mg total) by mouth 3 (three) times daily. 03/29/14   Kathrynn Ducking, MD  hydrOXYzine (ATARAX/VISTARIL) 50 MG tablet TAKE 1 TABLET (50 MG TOTAL) BY MOUTH EVERY 8 (EIGHT) HOURS AS NEEDED FOR ITCHING. 09/30/15   [name], [name]  levETIRAcetam (KEPPRA XR) 500 MG 24 hr tablet Take 3 tablets (1,500 mg total) by mouth at bedtime. 07/07/15   Kathrynn Ducking, MD  levETIRAcetam (KEPPRA) 1000 MG tablet Reported on 09/16/2015 06/28/15   Historical Provider, MD  metolazone (ZAROXOLYN) 2.5 MG tablet TAKE 1 TABLET ON MON, WED, AND FRIDAY 09/27/15   Chipper Herb, MD  Multiple Vitamins-Minerals (MULTIVITAMIN WITH MINERALS) tablet Take 1 tablet by mouth daily.    Historical Provider, MD  nystatin (NYSTATIN) powder Apply topically 3 (three) times daily. 09/16/15   [name], [name]  nystatin-triamcinolone ointment (MYCOLOG) Apply 1 application topically 2 (two) times daily. 09/16/15   [name], [name]  olmesartan (BENICAR) 20 MG tablet TAKE 1 TABLET (20 MG TOTAL) BY MOUTH DAILY. 09/13/15   Chipper Herb, MD  PHENobarbital (LUMINAL) 97.2 MG tablet TAKE 1 TABLET EVERY DAY 05/30/15   Chipper Herb, MD  triamcinolone cream (KENALOG) 0.1 % APPLY 1 APPLICATION ON THE SKIN TWICE A DAY AS NEEDED (PRN) 09/06/15   Historical Provider, MD  ULORIC 80 MG TABS TAKE 1 TABLET BY MOUTH EVERY DAY 08/22/15   Chipper Herb, MD   BP 100/53 mmHg  Pulse 86  Temp(Src) 98.6 F (37 C) (Oral)  Resp 13  Ht 5\' 6"  (1.676 m)  Wt 238 lb (107.956 kg)  BMI 38.43 kg/m2  SpO2 100% Physical Exam  Constitutional: She appears well-developed and well-nourished. No distress.  HENT:  Head: Normocephalic and atraumatic.  Mouth/Throat: No oropharyngeal exudate.  Mucous membranes are slightly dehydrated  Eyes: Conjunctivae and EOM are normal. Pupils are equal, round, and reactive to light. Right eye exhibits no discharge. Left eye exhibits no discharge. No scleral icterus.  Neck: Normal range of motion. Neck  supple. No JVD present. No thyromegaly present.  Cardiovascular: Normal rate, regular rhythm, normal heart sounds and intact distal pulses.  Exam reveals no gallop and no friction rub.   No murmur heard. Pulmonary/Chest: Effort normal and breath sounds normal. No respiratory distress. She has no wheezes. She has no rales.  Abdominal: Soft. Bowel sounds are normal. She exhibits no distension and no mass. There is no tenderness.  Musculoskeletal: Normal range of motion. She exhibits no edema or tenderness.  Lymphadenopathy:    She has no cervical adenopathy.  Neurological: She is alert. Coordination normal.  The patient is  awake, alert, follows commands without difficulty, normal strength in all 4 extremities. Speech is normal, cranial nerves III through XII appear normal.  Skin: Skin is warm and dry. No rash noted. No erythema.  Psychiatric: She has a normal mood and affect. Her behavior is normal.  Nursing note and vitals reviewed.   ED Course  Procedures (including critical care time) Labs Review Labs Reviewed  CBC - Abnormal; Notable for the following:    Hemoglobin 10.1 (*)    HCT 30.3 (*)    MCV 73.2 (*)    MCH 24.4 (*)    All other components within normal limits  COMPREHENSIVE METABOLIC PANEL - Abnormal; Notable for the following:    Chloride 96 (*)    BUN 98 (*)    Creatinine, Ser 4.10 (*)    Alkaline Phosphatase 33 (*)    GFR calc non Af Amer 9 (*)    GFR calc Af Amer 10 (*)    All other components within normal limits  URINE CULTURE  TROPONIN I  TSH  URINALYSIS, ROUTINE W REFLEX MICROSCOPIC (NOT AT [facility])  CBG MONITORING, ED    Imaging Review Ct Head Wo Contrast  10/11/2015  CLINICAL DATA:  Syncope EXAM: CT HEAD WITHOUT CONTRAST TECHNIQUE: Contiguous axial images were obtained from the base of the skull through the vertex without intravenous contrast. COMPARISON:  06/08/2014 FINDINGS: Brain: No intracranial hemorrhage, mass effect or midline shift. No acute cortical  infarction. No mass lesion is noted on this unenhanced scan. Vascular: Minimal atherosclerotic calcifications of carotid siphon. Skull: No skull fracture. Sinuses/Orbits: Paranasal sinuses and mastoid air cells are unremarkable. Other: None IMPRESSION: No acute intracranial abnormality. Electronically Signed   By: Lahoma Crocker M.D.   On: 10/11/2015 18:04   I have personally reviewed and evaluated these images and lab results as part of my medical decision-making.   EKG Interpretation   Date/Time:  Tuesday October 11 2015 16:04:42 EDT Ventricular Rate:  87 PR Interval:  160 QRS Duration: 95 QT Interval:  386 QTC Calculation: 465 R Axis:   -19 Text Interpretation:  Sinus rhythm Borderline left axis deviation Low  voltage, extremity and precordial leads since last tracing no significant  change Confirmed by Rehan Holness  MD, Jazsmine Macari ([id number]) on 10/11/2015 4:20:54 PM      MDM   Final diagnoses:  AKI (acute kidney injury) ([place])  Weakness    The patient has had generalized fatigue which has been progressive associated with a massive weight loss. She is also on diuretic medications which could indicate that the patient has significant dehydration or possible renal injury, this does not sound much like seizure activity as it does a possible syncopal episode. Her blood pressure on arrival was 90 systolic but without any intervention it has spontaneously risen to [id number] systolic. She has strong peripheral pulses and no signs of edema or JVD. Lungs are clear, heart is regular, EKG is unremarkable other than low voltage diffusely. We'll obtain labs, CT scan of the brain, cardiac monitoring.  AKI with Cr of close to 4 an BUN of 98, likely pre renal, d/w Dr. Nehemiah Settle - will dmit Holding orders requested.  Meds given in ED:  Medications  sodium chloride 0.9 % bolus 1,000 mL (not administered)  sodium chloride 0.9 % bolus 1,000 mL (1,000 mLs Intravenous New Bag/Given 10/11/15 1709)       Noemi Chapel,  MD 10/11/15 [phone]

## 2015-10-11 NOTE — ED Notes (Signed)
Patient placed on continuous cardiac monitoring, continuous pulse 0x monitoring, and placed patient of 2L O2 [address].  EKG completed

## 2015-10-12 ENCOUNTER — Other Ambulatory Visit: Payer: Self-pay | Admitting: Family

## 2015-10-12 DIAGNOSIS — R627 Adult failure to thrive: Secondary | ICD-10-CM | POA: Diagnosis not present

## 2015-10-12 DIAGNOSIS — N183 Chronic kidney disease, stage 3 (moderate): Secondary | ICD-10-CM

## 2015-10-12 DIAGNOSIS — N179 Acute kidney failure, unspecified: Secondary | ICD-10-CM | POA: Diagnosis not present

## 2015-10-12 LAB — FOLATE: Folate: 35 ng/mL (ref >5.9)

## 2015-10-12 LAB — BASIC METABOLIC PANEL
ANION GAP: 6 (ref 5–15)
BUN: 90 mg/dL — ABNORMAL HIGH (ref 6–20)
CALCIUM: 9.5 mg/dL (ref 8.9–10.3)
CO2: 30 mmol/L (ref 22–32)
Chloride: 101 mmol/L (ref 101–111)
Creatinine, Ser: 3.59 mg/dL — ABNORMAL HIGH (ref 0.44–1.00)
GFR calc Af Amer: 12 mL/min — ABNORMAL LOW (ref >60)
GFR, EST NON AFRICAN AMERICAN: 10 mL/min — AB (ref >60)
Glucose, Bld: 91 mg/dL (ref 65–99)
POTASSIUM: 3.7 mmol/L (ref 3.5–5.1)
SODIUM: 137 mmol/L (ref 135–145)

## 2015-10-12 LAB — VITAMIN B12: VITAMIN B 12: 1134 pg/mL — AB (ref 180–914)

## 2015-10-12 MED ORDER — PRO-STAT SUGAR FREE PO LIQD
30.0000 mL | Freq: Two times a day (BID) | ORAL | Status: DC
  Administered 2015-10-12 – 2015-10-14 (×4): 30 mL via ORAL
  Filled 2015-10-12 (×3): qty 30

## 2015-10-12 MED ORDER — NYSTATIN-TRIAMCINOLONE 100000-0.1 UNIT/GM-% EX CREA
TOPICAL_CREAM | Freq: Two times a day (BID) | CUTANEOUS | Status: DC
  Administered 2015-10-12 – 2015-10-14 (×5): via TOPICAL
  Filled 2015-10-12 (×2): qty 15

## 2015-10-12 NOTE — Care Management Obs Status (Signed)
[facility] NOTIFICATION   Patient Details  Name: [name] MRN: [id number] Date of Birth: 12-Jul-1923   Medicare Observation Status Notification Given:  Yes    Sherald Barge, RN 10/12/2015, 2:12 PM

## 2015-10-12 NOTE — Progress Notes (Signed)
Initial Nutrition Assessment  DOCUMENTATION CODES:   Obesity unspecified  INTERVENTION:  Boost Breeze po TID, each supplement provides 250 kcal and 9 grams of protein   ProStat 30 ml TID (each 30 ml provides 100 kcal, 15 gr protein)   NUTRITION DIAGNOSIS:   Inadequate oral intake related to poor appetite as evidenced by per patient/family report lack of interest in food or beverages and 15% or (40#) unplanned weight loss over the past year.   GOAL:   Patient will meet greater than or equal to 90% of their needs   MONITOR:   Po intake (meals and supplements), labs and wt trends     REASON FOR ASSESSMENT:   Malnutrition Screening Tool, Consult Poor PO  ASSESSMENT:  Patient presents with acute kidney injury, poor appetite and unplanned weight loss. Her weight has decreased 40# (15%) over the past year.  She lives alone and says she feels like since her husband died back in 2008/11/10 she has just been "floating along". Pt says she uses a breathing machine at night and has been having pain in her knees which she says limits her mobility and ability to stand to prepare her meals. Her daughter helps her with set-up for her showers but question patients ability to maintain adeuquate nutrition independently.  Pt says she just doesn't "think about" eating and sometimes she has a taste for something such as a hamburger she will eat 1-2 bites and be finished with it. She says this has been happening several months and she is concerned about it and understands she needs to eat and drink sufficiently in order to help maintain her health. Her intake here is very poor - only 20% of meal thus far.   She doesn't drink milk products at all or regular Ayesha Rumpf but is agreeable to try [employer].   NFPE: unable to perform- full exam at this time. Upper body -no visible signs of malnutrition or edema.   Recent Labs Lab 10/11/15 1545 10/12/15 0540  NA 135 137  K 4.2 3.7  CL 96* 101   CO2 27 30  BUN 98* 90*  CREATININE 4.10* 3.59*  CALCIUM 10.3 9.5  GLUCOSE 91 91    Labs: BUN and Creatinine-high   Diet Order:  Diet regular Room service appropriate?: Yes; Fluid consistency:: Thin  Skin:    intact  Last BM:  6/28  Height:   Ht Readings from Last 1 Encounters:  10/11/15 5\' 6"  (1.676 m)    Weight:   Wt Readings from Last 1 Encounters:  10/11/15 230 lb (104.327 kg)    Ideal Body Weight:  59 kg  BMI:  Body mass index is 37.14 kg/(m^2).  Estimated Nutritional Needs:   Kcal:  [id number]  Protein:  118 gr  Fluid:  >1.6 liters daily  EDUCATION NEEDS:   No education needs identified at this time  Colman Cater MS,RD,CSG,LDN Office: [phone] Pager: [phone]

## 2015-10-12 NOTE — Progress Notes (Signed)
PROGRESS NOTE    [name]  [id number] DOB: September 21, 1923 DOA: 10/11/2015 PCP: [name], [name]     Brief Narrative:  82 year old woman admitted on 6/27 with complaints of generalized weakness. Patient went out shopping with her daughter yesterday but she felt very weak, her blood pressure was found to be 60/40, given some IV fluids but was found to be in acute renal failure which is the reason for admission.   Assessment & Plan:   Principal Problem:   Acute renal failure superimposed on stage 3 chronic kidney disease (HCC) Active Problems:   Seizures (HCC)   Dehydration   Failure to thrive in adult   Hypotension   Acute on chronic kidney disease stage III -Baseline creatinine appears to be around 1.9, creatinine was 4.1 on admission and down to 3.59 on 6/28.  -Due to prerenal azotemia plus likely a component of ATN given hypotension. -Continue IV fluids. -Recheck renal function in a.m.  Seizure disorder -Continue antiepileptics, no active seizures while in the hospital  Hypotension -BP low-normal, agree with holding antihypertensive agents today.   DVT prophylaxis: Lovenox Code Status: Full code Family Communication: Son at bedside updated on plan of care and all questions answered Disposition Plan: Discharge home once medically ready, will request PT evaluation  Consultants:   None  Procedures:   None  Antimicrobials:   None    Subjective: Feels improved, still weak  Objective: Filed Vitals:   10/11/15 2023 10/12/15 0550 10/12/15 0933 10/12/15 1436  BP: 148/74 92/54 101/51 124/53  Pulse: 89 79  78  Temp: 97.8 F (36.6 C) 98.5 F (36.9 C)  97.4 F (36.3 C)  TempSrc: Axillary Oral  Oral  Resp: 20 20  18   Height: 5\' 6"  (1.676 m)     Weight: 104.327 kg (230 lb)     SpO2: 100% 97%  100%    Intake/Output Summary (Last 24 hours) at 10/12/15 1644 Last data filed at 10/12/15 1621  Gross per 24 hour  Intake    480 ml  Output    850 ml  Net    -370 ml   Filed Weights   10/11/15 1449 10/11/15 2023  Weight: 107.956 kg (238 lb) 104.327 kg (230 lb)    Examination:  General exam: Alert, awake, oriented x 3 Respiratory system: Clear to auscultation. Respiratory effort normal. Cardiovascular system:RRR. No murmurs, rubs, gallops. Gastrointestinal system: Abdomen is nondistended, soft and nontender. No organomegaly or masses felt. Normal bowel sounds heard. Central nervous system: Alert and oriented. No focal neurological deficits. Extremities: No C/C/E, +pedal pulses Skin: No rashes, lesions or ulcers Psychiatry: Judgement and insight appear normal. Mood & affect appropriate.     Data Reviewed: I have personally reviewed following labs and imaging studies  CBC:  Recent Labs Lab 10/11/15 1545  WBC 7.4  HGB 10.1*  HCT 30.3*  MCV 73.2*  PLT [id number]   Basic Metabolic Panel:  Recent Labs Lab 10/11/15 1545 10/12/15 0540  NA 135 137  K 4.2 3.7  CL 96* 101  CO2 27 30  GLUCOSE 91 91  BUN 98* 90*  CREATININE 4.10* 3.59*  CALCIUM 10.3 9.5   GFR: Estimated Creatinine Clearance: 12.7 mL/min (by C-G formula based on Cr of 3.59). Liver Function Tests:  Recent Labs Lab 10/11/15 1545  AST 20  ALT 15  ALKPHOS 33*  BILITOT 0.5  PROT 7.7  ALBUMIN 4.4   No results for input(s): LIPASE, AMYLASE in the last 168 hours. No results for input(s):  AMMONIA in the last 168 hours. Coagulation Profile: No results for input(s): INR, PROTIME in the last 168 hours. Cardiac Enzymes:  Recent Labs Lab 10/11/15 1545  TROPONINI <0.03   BNP (last 3 results) No results for input(s): PROBNP in the last 8760 hours. HbA1C: No results for input(s): HGBA1C in the last 72 hours. CBG:  Recent Labs Lab 10/11/15 1611  GLUCAP 96   Lipid Profile: No results for input(s): CHOL, HDL, LDLCALC, TRIG, CHOLHDL, LDLDIRECT in the last 72 hours. Thyroid Function Tests:  Recent Labs  10/11/15 1545  TSH 3.451   Anemia Panel:  Recent  Labs  10/12/15 0543 10/12/15 0544  VITAMINB12 1134*  --   FOLATE  --  35.0   Urine analysis:    Component Value Date/Time   COLORURINE YELLOW 10/11/2015 1805   APPEARANCEUR CLEAR 10/11/2015 1805   LABSPEC 1.010 10/11/2015 1805   PHURINE 5.5 10/11/2015 1805   GLUCOSEU NEGATIVE 10/11/2015 1805   HGBUR NEGATIVE 10/11/2015 1805   BILIRUBINUR NEGATIVE 10/11/2015 1805   BILIRUBINUR neg 03/09/2015 1652   KETONESUR NEGATIVE 10/11/2015 1805   PROTEINUR NEGATIVE 10/11/2015 1805   PROTEINUR neg 03/09/2015 1652   UROBILINOGEN negative 03/09/2015 1652   NITRITE NEGATIVE 10/11/2015 1805   NITRITE neg 03/09/2015 1652   LEUKOCYTESUR NEGATIVE 10/11/2015 1805   Sepsis Labs: @LABRCNTIP (procalcitonin:4,lacticidven:4)  )No results found for this or any previous visit (from the past 240 hour(s)).       Radiology Studies: Ct Head Wo Contrast  10/11/2015  CLINICAL DATA:  Syncope EXAM: CT HEAD WITHOUT CONTRAST TECHNIQUE: Contiguous axial images were obtained from the base of the skull through the vertex without intravenous contrast. COMPARISON:  06/08/2014 FINDINGS: Brain: No intracranial hemorrhage, mass effect or midline shift. No acute cortical infarction. No mass lesion is noted on this unenhanced scan. Vascular: Minimal atherosclerotic calcifications of carotid siphon. Skull: No skull fracture. Sinuses/Orbits: Paranasal sinuses and mastoid air cells are unremarkable. Other: None IMPRESSION: No acute intracranial abnormality. Electronically Signed   By: Lahoma Crocker M.D.   On: 10/11/2015 18:04        Scheduled Meds: . aspirin  81 mg Oral Daily  . cyproheptadine  4 mg Oral QID  . enoxaparin (LOVENOX) injection  30 mg Subcutaneous Q24H  . febuxostat  80 mg Oral Daily  . feeding supplement (PRO-STAT SUGAR FREE 64)  30 mL Oral BID  . gabapentin  100 mg Oral TID  . levETIRAcetam  1,500 mg Oral QHS  . nystatin   Topical TID  . nystatin-triamcinolone   Topical BID  . PHENobarbital  97.2 mg  Oral Daily   Continuous Infusions: . sodium chloride 100 mL/hr at 10/11/15 2030        Time spent: 25 minutes. Greater than 50% of this time was spent in direct contact with the patient coordinating care.     Lelon Frohlich, MD [facility] Pager [phone]  If 7PM-7AM, please contact night-coverage [email or web address] Password [facility] 10/12/2015, 4:44 PM

## 2015-10-12 NOTE — Care Management Note (Signed)
Case Management Note  Patient Details  Name: LURA SHUSTERMAN MRN: [id number] Date of Birth: [birth year]  Subjective/Objective:                  Pt is from home, lives alone and is ind with ADL's. Pt has daughter who visits daily, takes her to appointments and to do shopping and who prepares meals when pt is unable to do. Pt ambulates with cane. Pt states she has been in SNF last year for approx 2 weeks and did no receive [facility] services afterwards. Pt plans to return home with self care and support of her daughter. Pt also has son who lives in [place].   Action/Plan: Anticipate return home with self care. No CM needs anticipated.   Expected Discharge Date:  10/12/15               Expected Discharge Plan:  Home/Self Care  In-House Referral:  NA  Discharge planning Services  CM Consult  Post Acute Care Choice:  NA Choice offered to:  NA  DME Arranged:    DME Agency:     HH Arranged:    HH Agency:     Status of Service:  Completed, signed off  If discussed at [employer] of Stay Meetings, dates discussed:    Additional Comments:  Sherald Barge, RN 10/12/2015, 2:13 PM

## 2015-10-13 DIAGNOSIS — N179 Acute kidney failure, unspecified: Secondary | ICD-10-CM | POA: Diagnosis not present

## 2015-10-13 DIAGNOSIS — N183 Chronic kidney disease, stage 3 (moderate): Secondary | ICD-10-CM | POA: Diagnosis not present

## 2015-10-13 LAB — BASIC METABOLIC PANEL
ANION GAP: 7 (ref 5–15)
BUN: 81 mg/dL — ABNORMAL HIGH (ref 6–20)
CALCIUM: 9.1 mg/dL (ref 8.9–10.3)
CHLORIDE: 105 mmol/L (ref 101–111)
CO2: 28 mmol/L (ref 22–32)
Creatinine, Ser: 2.54 mg/dL — ABNORMAL HIGH (ref 0.44–1.00)
GFR, EST AFRICAN AMERICAN: 18 mL/min — AB (ref >60)
GFR, EST NON AFRICAN AMERICAN: 16 mL/min — AB (ref >60)
GLUCOSE: 90 mg/dL (ref 65–99)
POTASSIUM: 4.3 mmol/L (ref 3.5–5.1)
Sodium: 140 mmol/L (ref 135–145)

## 2015-10-13 LAB — URINE CULTURE: Culture: NO GROWTH

## 2015-10-13 MED ORDER — LEVETIRACETAM 500 MG PO TABS
ORAL_TABLET | ORAL | Status: AC
  Filled 2015-10-13: qty 1

## 2015-10-13 NOTE — Evaluation (Signed)
Physical Therapy Evaluation Patient Details Name: [name] MRN: [id number] DOB: [birth year] Today's Date: 10/13/2015   History of Present Illness  82 yo F admitted with acute renal failure superimposed on stage 3 chronic kidney disease, dehydration, hypotension, and failure to thrive. Chief complaint is weakness. Head CT (-). PMH: seizure disorder, HTN, CVA, obesity, degenerative arthritis, peripheral edema, hiatal hernia, neurogenic claudication, chronic LBP, B knee surgery, appendectomy, mammoplasty reduction, neuroplasty - transposition median nerve at carpal tunnel, appendectomy.   Clinical Impression  Pt received in bed, and was agreeable to PT evaluation.  Pt states she normally walks with a cane at home, and she independent with dressing, but her dtr assists her with showering.  Her dtr comes by every day to take her to doctor's appointments and such.  During today's evaluation, she ambulated 131ft with cane, and supervision, as well as negotiated 3 steps with cane and supervision.  Pt expressed that she is at her baseline level of functioning.  No skilled PT needs at this time, therefore, will sign off.     Follow Up Recommendations No PT follow up    Equipment Recommendations  None recommended by PT    Recommendations for Other Services       Precautions / Restrictions Precautions Precautions: None Restrictions Weight Bearing Restrictions: No      Mobility  Bed Mobility Overal bed mobility: Modified Independent                Transfers Overall transfer level: Modified independent Equipment used: None                Ambulation/Gait Ambulation/Gait assistance: Min guard;Supervision Ambulation Distance (Feet): 150 Feet Assistive device: Straight cane Gait Pattern/deviations: Step-through pattern;Wide base of support;Trunk flexed     General Gait Details: Pt demonstrates fwd flexed posture, and increased lateral sway during gait, however no LOB.    Stairs Stairs: Yes Stairs assistance: Supervision Stair Management: Alternating pattern;One rail Left;With cane Number of Stairs: 3 General stair comments: Pt negotiated stairs quickly, and safely.   Wheelchair Mobility    Modified Rankin (Stroke Patients Only)       Balance Overall balance assessment: Needs assistance         Standing balance support: Single extremity supported Standing balance-Leahy Scale: Fair                               Pertinent Vitals/Pain Pain Assessment: No/denies pain    Home Living   Living Arrangements: Alone   Type of Home: House Home Access: Stairs to enter Entrance Stairs-Rails: Right Entrance Stairs-Number of Steps: 3 steps going in the back Home Layout: One level Home Equipment: Bedside commode;Cane - single point;Walker - 2 wheels;Walker - 4 wheels;Shower seat      Prior Function     Gait / Transfers Assistance Needed: Pt states that she walks with a cane all the time.  She also uses a RW occasionally.   ADL's / Homemaking Assistance Needed: Dtr assists with shower, but able to dress herself.         Hand Dominance   Dominant Hand: Right    Extremity/Trunk Assessment                         Communication   Communication: No difficulties  Cognition Arousal/Alertness: Awake/alert Behavior During Therapy: WFL for tasks assessed/performed Overall Cognitive Status: Within Functional Limits for tasks assessed  General Comments      Exercises        Assessment/Plan    PT Assessment Patent does not need any further PT services  PT Diagnosis Difficulty walking;Generalized weakness;Abnormality of gait   PT Problem List    PT Treatment Interventions     PT Goals (Current goals can be found in the Care Plan section) Acute Rehab PT Goals Patient Stated Goal: Pt wants to go home.  PT Goal Formulation: With patient Time For Goal Achievement: 10/20/15 Potential  to Achieve Goals: Good    Frequency     Barriers to discharge        Co-evaluation               End of Session Equipment Utilized During Treatment: Gait belt Activity Tolerance: Patient tolerated treatment well Patient left: in chair Nurse Communication: Mobility status    Functional Assessment Tool Used: [employer] "6-clicks"  Functional Limitation: Mobility: Walking and moving around Mobility: Walking and Moving Around Current Status [phone]): At least 1 percent but less than 20 percent impaired, limited or restricted Mobility: Walking and Moving Around Goal Status [phone]): At least 1 percent but less than 20 percent impaired, limited or restricted Mobility: Walking and Moving Around Discharge Status [phone]): At least 1 percent but less than 20 percent impaired, limited or restricted    Time: 1006-1040 PT Time Calculation (min) (ACUTE ONLY): 34 min   Charges:   PT Evaluation $PT Eval Low Complexity: 1 Procedure PT Treatments $Gait Training: 8-22 mins   PT G Codes:   PT G-Codes **NOT FOR INPATIENT CLASS** Functional Assessment Tool Used: [employer] "6-clicks"  Functional Limitation: Mobility: Walking and moving around Mobility: Walking and Moving Around Current Status [phone]): At least 1 percent but less than 20 percent impaired, limited or restricted Mobility: Walking and Moving Around Goal Status [phone]): At least 1 percent but less than 20 percent impaired, limited or restricted Mobility: Walking and Moving Around Discharge Status [phone]): At least 1 percent but less than 20 percent impaired, limited or restricted    Beth Tramane Gorum, PT, DPT X: [phone]

## 2015-10-13 NOTE — Progress Notes (Signed)
PROGRESS NOTE    NIMRAT [name]  [id number] DOB: 1923/10/01 DOA: 10/11/2015 PCP: [name], [name]     Brief Narrative:  82 year old woman admitted on 6/27 with complaints of generalized weakness. Patient went out shopping with her daughter yesterday but she felt very weak, her blood pressure was found to be 60/40, given some IV fluids but was found to be in acute renal failure which is the reason for admission.   Assessment & Plan:   Principal Problem:   Acute renal failure superimposed on stage 3 chronic kidney disease (HCC) Active Problems:   Seizures (HCC)   Dehydration   Failure to thrive in adult   Hypotension   Acute on chronic kidney disease stage III -Baseline creatinine appears to be around 1.9, creatinine was 4.1 on admission and down to 3.59 on 6/28, 2.54 on 6/29. -Due to prerenal azotemia plus likely a component of ATN given hypotension. -Continue IV fluids. -Recheck renal function in a.m.  Seizure disorder -Continue antiepileptics, no active seizures while in the hospital  Hypotension -BP low-normal, agree with holding antihypertensive agents today.   DVT prophylaxis: Lovenox Code Status: Full code Family Communication: Patient only Disposition Plan: Discharge home once medically ready, will request PT evaluation  Consultants:   None  Procedures:   None  Antimicrobials:   None    Subjective: Feels improved, still weak  Objective: Filed Vitals:   10/12/15 1436 10/12/15 1937 10/12/15 2148 10/13/15 1500  BP: 124/53  121/57 107/60  Pulse: 78  84 85  Temp: 97.4 F (36.3 C)  98.2 F (36.8 C) 97.7 F (36.5 C)  TempSrc: Oral  Oral Oral  Resp: 18  20 18   Height:      Weight:      SpO2: 100% 93% 100% 99%    Intake/Output Summary (Last 24 hours) at 10/13/15 1628 Last data filed at 10/13/15 1500  Gross per 24 hour  Intake    720 ml  Output    950 ml  Net   -230 ml   Filed Weights   10/11/15 1449 10/11/15 2023  Weight: 107.956 kg  (238 lb) 104.327 kg (230 lb)    Examination:  General exam: Alert, awake, oriented x 3 Respiratory system: Clear to auscultation. Respiratory effort normal. Cardiovascular system:RRR. No murmurs, rubs, gallops. Gastrointestinal system: Abdomen is nondistended, soft and nontender. No organomegaly or masses felt. Normal bowel sounds heard. Central nervous system: Alert and oriented. No focal neurological deficits. Extremities: No C/C/E, +pedal pulses Skin: No rashes, lesions or ulcers Psychiatry: Judgement and insight appear normal. Mood & affect appropriate.     Data Reviewed: I have personally reviewed following labs and imaging studies  CBC:  Recent Labs Lab 10/11/15 1545  WBC 7.4  HGB 10.1*  HCT 30.3*  MCV 73.2*  PLT [id number]   Basic Metabolic Panel:  Recent Labs Lab 10/11/15 1545 10/12/15 0540 10/13/15 0551  NA 135 137 140  K 4.2 3.7 4.3  CL 96* 101 105  CO2 27 30 28   GLUCOSE 91 91 90  BUN 98* 90* 81*  CREATININE 4.10* 3.59* 2.54*  CALCIUM 10.3 9.5 9.1   GFR: Estimated Creatinine Clearance: 18 mL/min (by C-G formula based on Cr of 2.54). Liver Function Tests:  Recent Labs Lab 10/11/15 1545  AST 20  ALT 15  ALKPHOS 33*  BILITOT 0.5  PROT 7.7  ALBUMIN 4.4   No results for input(s): LIPASE, AMYLASE in the last 168 hours. No results for input(s): AMMONIA in the last  168 hours. Coagulation Profile: No results for input(s): INR, PROTIME in the last 168 hours. Cardiac Enzymes:  Recent Labs Lab 10/11/15 1545  TROPONINI <0.03   BNP (last 3 results) No results for input(s): PROBNP in the last 8760 hours. HbA1C: No results for input(s): HGBA1C in the last 72 hours. CBG:  Recent Labs Lab 10/11/15 1611  GLUCAP 96   Lipid Profile: No results for input(s): CHOL, HDL, LDLCALC, TRIG, CHOLHDL, LDLDIRECT in the last 72 hours. Thyroid Function Tests:  Recent Labs  10/11/15 1545  TSH 3.451   Anemia Panel:  Recent Labs  10/12/15 0543  10/12/15 0544  VITAMINB12 1134*  --   FOLATE  --  35.0   Urine analysis:    Component Value Date/Time   COLORURINE YELLOW 10/11/2015 1805   APPEARANCEUR CLEAR 10/11/2015 1805   LABSPEC 1.010 10/11/2015 1805   PHURINE 5.5 10/11/2015 1805   GLUCOSEU NEGATIVE 10/11/2015 1805   HGBUR NEGATIVE 10/11/2015 1805   BILIRUBINUR NEGATIVE 10/11/2015 1805   BILIRUBINUR neg 03/09/2015 1652   KETONESUR NEGATIVE 10/11/2015 1805   PROTEINUR NEGATIVE 10/11/2015 1805   PROTEINUR neg 03/09/2015 1652   UROBILINOGEN negative 03/09/2015 1652   NITRITE NEGATIVE 10/11/2015 1805   NITRITE neg 03/09/2015 1652   LEUKOCYTESUR NEGATIVE 10/11/2015 1805   Sepsis Labs: @LABRCNTIP (procalcitonin:4,lacticidven:4)  ) Recent Results (from the past 240 hour(s))  Urine culture     Status: None   Collection Time: 10/11/15  6:05 PM  Result Value Ref Range Status   Specimen Description URINE, CATHETERIZED  Final   Special Requests NONE  Final   Culture NO GROWTH Performed at [facility]   Final   Report Status 10/13/2015 FINAL  Final         Radiology Studies: Ct Head Wo Contrast  10/11/2015  CLINICAL DATA:  Syncope EXAM: CT HEAD WITHOUT CONTRAST TECHNIQUE: Contiguous axial images were obtained from the base of the skull through the vertex without intravenous contrast. COMPARISON:  06/08/2014 FINDINGS: Brain: No intracranial hemorrhage, mass effect or midline shift. No acute cortical infarction. No mass lesion is noted on this unenhanced scan. Vascular: Minimal atherosclerotic calcifications of carotid siphon. Skull: No skull fracture. Sinuses/Orbits: Paranasal sinuses and mastoid air cells are unremarkable. Other: None IMPRESSION: No acute intracranial abnormality. Electronically Signed   By: Lahoma Crocker M.D.   On: 10/11/2015 18:04        Scheduled Meds: . aspirin  81 mg Oral Daily  . cyproheptadine  4 mg Oral QID  . enoxaparin (LOVENOX) injection  30 mg Subcutaneous Q24H  . febuxostat  80 mg  Oral Daily  . feeding supplement (PRO-STAT SUGAR FREE 64)  30 mL Oral BID  . gabapentin  100 mg Oral TID  . levETIRAcetam  1,500 mg Oral QHS  . nystatin   Topical TID  . nystatin-triamcinolone   Topical BID  . PHENobarbital  97.2 mg Oral Daily   Continuous Infusions: . sodium chloride 100 mL/hr at 10/11/15 2030        Time spent: 25 minutes. Greater than 50% of this time was spent in direct contact with the patient coordinating care.     Lelon Frohlich, MD [facility] Pager [phone]  If 7PM-7AM, please contact night-coverage [email or web address] Password [facility] 10/13/2015, 4:28 PM

## 2015-10-14 DIAGNOSIS — N179 Acute kidney failure, unspecified: Secondary | ICD-10-CM | POA: Diagnosis not present

## 2015-10-14 DIAGNOSIS — N183 Chronic kidney disease, stage 3 (moderate): Secondary | ICD-10-CM | POA: Diagnosis not present

## 2015-10-14 DIAGNOSIS — R627 Adult failure to thrive: Secondary | ICD-10-CM

## 2015-10-14 LAB — BASIC METABOLIC PANEL
Anion gap: 5 (ref 5–15)
BUN: 71 mg/dL — ABNORMAL HIGH (ref 6–20)
CALCIUM: 9 mg/dL (ref 8.9–10.3)
CO2: 27 mmol/L (ref 22–32)
CREATININE: 2.11 mg/dL — AB (ref 0.44–1.00)
Chloride: 106 mmol/L (ref 101–111)
GFR calc Af Amer: 23 mL/min — ABNORMAL LOW (ref >60)
GFR calc non Af Amer: 20 mL/min — ABNORMAL LOW (ref >60)
GLUCOSE: 86 mg/dL (ref 65–99)
Potassium: 4 mmol/L (ref 3.5–5.1)
Sodium: 138 mmol/L (ref 135–145)

## 2015-10-14 MED ORDER — FUROSEMIDE 40 MG PO TABS: 40.0000 mg | ORAL_TABLET | Freq: Every day | ORAL | Status: DC

## 2015-10-14 NOTE — Progress Notes (Signed)
Patient to be discharged home.  Reviewed DC instructions and medications, pt verbalizes understanding.  Follow up in place. IV removed - WNL.  No questions at this time.  Awaiting for daughter to arrive for pick - up.  Patient in NAD at this time.

## 2015-10-14 NOTE — Discharge Summary (Signed)
Physician Discharge Summary  [name] [id number] DOB: February 27, 1924 DOA: 10/11/2015  PCP: [name], [name]  Admit date: 10/11/2015 Discharge date: 10/14/2015  Time spent: 45 minutes  Recommendations for Outpatient Follow-up:  -Will be discharged home today. -Advised to follow up with PCP in 1 weeks at which time renal function should be checked. -Please note that lasix dose has been decreased and metolazone has been discontinued.  Discharge Diagnoses:  Principal Problem:   Acute renal failure superimposed on stage 3 chronic kidney disease ([place]) Active Problems:   Seizures ([place])   Dehydration   Failure to thrive in adult   Hypotension   Discharge Condition: Stable and improved  Filed Weights   10/11/15 1449 10/11/15 2023  Weight: 107.956 kg (238 lb) 104.327 kg (230 lb)    History of present illness:  As per Dr. Nehemiah Settle on 6/27: [name] ZWEIFEL is a 82 y.o. female with a history of seizure disorder, hypertension, history of stroke, obesity, degenerative arthritis, peripheral edema on diuretics. Patient presents with 40 pound weight loss over the past year due to decreased oral intake of both food and liquid and weakness with hypotension earlier today. Patient was out shopping with her daughter and when she returned home she felt very weak and laid on the bed. Her daughter took her blood pressure and found it to be 60/40. She was brought to the emergency department for evaluation. She was given a couple liters of fluid, which improved her blood pressure. Patient would feel dizzy and lightheaded when standing. No fevers, chills, nausea, vomiting. She has been on an appetite stimulant, but this hasn't helped much for her weight loss.  Hospital Course:   Acute on chronic kidney disease stage III -Baseline creatinine appears to be around 1.9, creatinine was 4.1 on admission and down to 3.59 on 6/28, 2.54 on 6/29 and 2.01 on day of DC. -Due to prerenal azotemia plus likely a  component of ATN given hypotension. -Lasix dose has been decreased from 80 mg BID to 40 mg daily. Metolazone has been discontinued. ARB remains on hold as well.  Seizure disorder -Continue antiepileptics, no active seizures while in the hospital  Hypotension -BP has recovered.  Procedures:  None   Consultations:  None  Discharge Instructions  Discharge Instructions    Diet - low sodium heart healthy    Complete by:  As directed      Increase activity slowly    Complete by:  As directed             Medication List    STOP taking these medications        metolazone 2.5 MG tablet  Commonly known as:  ZAROXOLYN     olmesartan 20 MG tablet  Commonly known as:  BENICAR     triamcinolone cream 0.1 %  Commonly known as:  KENALOG      TAKE these medications        albuterol 108 (90 Base) MCG/ACT inhaler  Commonly known as:  PROVENTIL HFA;VENTOLIN HFA  Inhale 2 puffs into the lungs every 6 (six) hours as needed for wheezing or shortness of breath.     albuterol (2.5 MG/3ML) 0.083% nebulizer solution  Commonly known as:  PROVENTIL  Take 3 mLs (2.5 mg total) by nebulization at bedtime as needed for wheezing or shortness of breath.     aspirin 81 MG tablet  Take 81 mg by mouth daily.     cyproheptadine 4 MG tablet  Commonly  known as:  PERIACTIN  Start 2 mg QID then increase by 2 mg weekly until 8 mg qid     ferrous sulfate 325 (65 FE) MG tablet  Take 325 mg by mouth every Monday, Wednesday, and Friday.     furosemide 40 MG tablet  Commonly known as:  LASIX  Take 1 tablet (40 mg total) by mouth daily.     hydrOXYzine 50 MG tablet  Commonly known as:  ATARAX/VISTARIL  TAKE 1 TABLET (50 MG TOTAL) BY MOUTH EVERY 8 (EIGHT) HOURS AS NEEDED FOR ITCHING.     levETIRAcetam 500 MG 24 hr tablet  Commonly known as:  KEPPRA XR  Take 3 tablets (1,500 mg total) by mouth at bedtime.     multivitamin with minerals tablet  Take 1 tablet by mouth daily.     nystatin  powder  Commonly known as:  nystatin  Apply topically 3 (three) times daily.     nystatin-triamcinolone ointment  Commonly known as:  MYCOLOG  Apply 1 application topically 2 (two) times daily.     PHENobarbital 97.2 MG tablet  Commonly known as:  LUMINAL  TAKE 1 TABLET EVERY DAY     ULORIC 80 MG Tabs  Generic drug:  Febuxostat  TAKE 1 TABLET BY MOUTH EVERY DAY       Allergies  Allergen Reactions  . Advicor [Niacin-Lovastatin Er] Other (See Comments)    unknown  . Caduet [Amlodipine-Atorvastatin] Other (See Comments)    unknown  . Codeine Other (See Comments)    unknown  . Dilantin [Phenytoin] Other (See Comments)    unknown  . Lescol [Fluvastatin] Other (See Comments)  . Pamelor [Nortriptyline] Other (See Comments)    unknown  . Penicillins Other (See Comments)    unknown  . Sulfa Antibiotics Other (See Comments)    unknown  . Trileptal [Oxcarbazepine] Other (See Comments)    unknown  . Ultram [Tramadol] Other (See Comments)    unknown  . Welchol [Colesevelam] Other (See Comments)    unknown       Follow-up Information    Follow up with [name], [name]. Schedule an appointment as soon as possible for a visit on 11/01/2015.   Specialty:  Family Medicine   Why:  11:25   Contact information:   [place] [address] [phone]        The results of significant diagnostics from this hospitalization (including imaging, microbiology, ancillary and laboratory) are listed below for reference.    Significant Diagnostic Studies: Ct Head Wo Contrast  10/11/2015  CLINICAL DATA:  Syncope EXAM: CT HEAD WITHOUT CONTRAST TECHNIQUE: Contiguous axial images were obtained from the base of the skull through the vertex without intravenous contrast. COMPARISON:  06/08/2014 FINDINGS: Brain: No intracranial hemorrhage, mass effect or midline shift. No acute cortical infarction. No mass lesion is noted on this unenhanced scan. Vascular: Minimal  atherosclerotic calcifications of carotid siphon. Skull: No skull fracture. Sinuses/Orbits: Paranasal sinuses and mastoid air cells are unremarkable. Other: None IMPRESSION: No acute intracranial abnormality. Electronically Signed   By: Lahoma Crocker M.D.   On: 10/11/2015 18:04    Microbiology: Recent Results (from the past 240 hour(s))  Urine culture     Status: None   Collection Time: 10/11/15  6:05 PM  Result Value Ref Range Status   Specimen Description URINE, CATHETERIZED  Final   Special Requests NONE  Final   Culture NO GROWTH Performed at [facility]   Final   Report  Status 10/13/2015 FINAL  Final     Labs: Basic Metabolic Panel:  Recent Labs Lab 10/11/15 1545 10/12/15 0540 10/13/15 0551 10/14/15 0623  NA 135 137 140 138  K 4.2 3.7 4.3 4.0  CL 96* 101 105 106  CO2 27 30 28 27   GLUCOSE 91 91 90 86  BUN 98* 90* 81* 71*  CREATININE 4.10* 3.59* 2.54* 2.11*  CALCIUM 10.3 9.5 9.1 9.0   Liver Function Tests:  Recent Labs Lab 10/11/15 1545  AST 20  ALT 15  ALKPHOS 33*  BILITOT 0.5  PROT 7.7  ALBUMIN 4.4   No results for input(s): LIPASE, AMYLASE in the last 168 hours. No results for input(s): AMMONIA in the last 168 hours. CBC:  Recent Labs Lab 10/11/15 1545  WBC 7.4  HGB 10.1*  HCT 30.3*  MCV 73.2*  PLT 151   Cardiac Enzymes:  Recent Labs Lab 10/11/15 1545  TROPONINI <0.03   BNP: BNP (last 3 results) No results for input(s): BNP in the last 8760 hours.  ProBNP (last 3 results) No results for input(s): PROBNP in the last 8760 hours.  CBG:  Recent Labs Lab 10/11/15 1611  GLUCAP 96       Signed:  HERNANDEZ ACOSTA,ESTELA  [facility] Pager: [phone] 10/14/2015, 2:41 PM

## 2015-11-01 ENCOUNTER — Ambulatory Visit (INDEPENDENT_AMBULATORY_CARE_PROVIDER_SITE_OTHER): Payer: Medicare Other | Admitting: Family

## 2015-11-01 ENCOUNTER — Encounter: Payer: Self-pay | Admitting: Family

## 2015-11-01 VITALS — BP 101/64 | HR 107 | Temp 97.8°F | Ht 66.0 in | Wt 231.8 lb

## 2015-11-01 DIAGNOSIS — R748 Abnormal levels of other serum enzymes: Secondary | ICD-10-CM

## 2015-11-01 DIAGNOSIS — I9589 Other hypotension: Secondary | ICD-10-CM | POA: Diagnosis not present

## 2015-11-01 DIAGNOSIS — N183 Chronic kidney disease, stage 3 unspecified: Secondary | ICD-10-CM

## 2015-11-01 DIAGNOSIS — I10 Essential (primary) hypertension: Secondary | ICD-10-CM | POA: Diagnosis not present

## 2015-11-01 DIAGNOSIS — R7989 Other specified abnormal findings of blood chemistry: Secondary | ICD-10-CM

## 2015-11-01 DIAGNOSIS — N179 Acute kidney failure, unspecified: Secondary | ICD-10-CM

## 2015-11-01 DIAGNOSIS — Z09 Encounter for follow-up examination after completed treatment for conditions other than malignant neoplasm: Secondary | ICD-10-CM

## 2015-11-01 MED ORDER — FUROSEMIDE 40 MG PO TABS: 40.0000 mg | ORAL_TABLET | Freq: Every day | ORAL | Status: DC

## 2015-11-01 NOTE — Progress Notes (Signed)
Subjective:    Patient ID: [name], female    DOB: 11-16-1923, 82 y.o.   MRN: [id number]  HPI Pt presents to the office today for hospital follow up. PT went to the ED on 10/11/15 for hypotension and chronic kidney disease. Pt was found to have a creatinine of 4.1 on admission. Pt's baseline is creatinine 1.9. When patient was discharged from the hospital she was told to decrease Lasix to 40 mg daily from 80 mg daily. Pt states when she picked up her rx she picked up 80 mg daily. PT states she has been taking her lasix 80 mg daily and benicar 20 mg. Pt denies any weight gain, edema, or leg swelling.    Review of Systems  Constitutional: Positive for fatigue.  HENT: Negative.   Eyes: Negative.   Respiratory: Positive for shortness of breath ("at times").   Cardiovascular: Negative.  Negative for palpitations.  Gastrointestinal: Negative.   Endocrine: Negative.   Genitourinary: Negative.   Musculoskeletal: Negative.   Neurological: Negative.  Negative for headaches.  Hematological: Negative.   Psychiatric/Behavioral: Negative.   All other systems reviewed and are negative.      Objective:   Physical Exam  Constitutional: She is oriented to person, place, and time. She appears well-developed and well-nourished. No distress.  Obese   HENT:  Head: Normocephalic and atraumatic.  Right Ear: External ear normal.  Mouth/Throat: Oropharynx is clear and moist.  Eyes: Pupils are equal, round, and reactive to light.  Neck: Normal range of motion. Neck supple. No thyromegaly present.  Cardiovascular: Normal rate, regular rhythm, normal heart sounds and intact distal pulses.   No murmur heard. Pulmonary/Chest: Effort normal and breath sounds normal. No respiratory distress. She has no wheezes.  Abdominal: Soft. Bowel sounds are normal. She exhibits no distension. There is no tenderness.  Musculoskeletal: Normal range of motion. She exhibits no edema or tenderness.  Pt walking with  can  Neurological: She is alert and oriented to person, place, and time.  Skin: Skin is warm and dry.  Psychiatric: She has a normal mood and affect. Her behavior is normal. Judgment and thought content normal.  Vitals reviewed.     BP 101/64 mmHg  Pulse 107  Temp(Src) 97.8 F (36.6 C)  Ht '5\' 6"'  (1.676 m)  Wt 231 lb 12.8 oz (105.144 kg)  BMI 37.43 kg/m2     Assessment & Plan:   1. Essential hypertension, benign - furosemide (LASIX) 40 MG tablet; Take 1 tablet (40 mg total) by mouth daily.  Dispense: 30 tablet; Refill: 3 - BMP8+EGFR  2. Elevated serum creatinine - furosemide (LASIX) 40 MG tablet; Take 1 tablet (40 mg total) by mouth daily.  Dispense: 30 tablet; Refill: 3 - BMP8+EGFR  3. Acute renal failure superimposed on stage 3 chronic kidney disease (HCC) - furosemide (LASIX) 40 MG tablet; Take 1 tablet (40 mg total) by mouth daily.  Dispense: 30 tablet; Refill: 3 - BMP8+EGFR  4. Chronic kidney disease, stage III (moderate) - furosemide (LASIX) 40 MG tablet; Take 1 tablet (40 mg total) by mouth daily.  Dispense: 30 tablet; Refill: 3 - BMP8+EGFR  5. Other specified hypotension - furosemide (LASIX) 40 MG tablet; Take 1 tablet (40 mg total) by mouth daily.  Dispense: 30 tablet; Refill: 3 - BMP8+EGFR  6. Hospital discharge follow-up - furosemide (LASIX) 40 MG tablet; Take 1 tablet (40 mg total) by mouth daily.  Dispense: 30 tablet; Refill: 3 - BMP8+EGFR  PT's lasix decreased to 40  mg from 80 mg daily Pt told stop benicar 20 mg daily Pt told to stop metolazone Print out of new medication list given to patient and long discussion of what medicaitons to take RTO in 2 weeks to recheck BP  [name], [name]

## 2015-11-01 NOTE — Patient Instructions (Signed)
Lasix decreased to 40 mg from 80 mg daily Stop benicar 20 mg daily Stop metolazone

## 2015-11-02 LAB — BMP8+EGFR
BUN/Creatinine Ratio: 20 (ref 12–28)
BUN: 69 mg/dL — AB (ref 10–36)
CALCIUM: 10.4 mg/dL — AB (ref 8.7–10.3)
CHLORIDE: 93 mmol/L — AB (ref 96–106)
CO2: 24 mmol/L (ref 18–29)
CREATININE: 3.39 mg/dL — AB (ref 0.57–1.00)
GFR, EST AFRICAN AMERICAN: 13 mL/min/{1.73_m2} — AB (ref >59)
GFR, EST NON AFRICAN AMERICAN: 11 mL/min/{1.73_m2} — AB (ref >59)
Glucose: 113 mg/dL — ABNORMAL HIGH (ref 65–99)
Potassium: 4.6 mmol/L (ref 3.5–5.2)
Sodium: 137 mmol/L (ref 134–144)

## 2015-11-03 ENCOUNTER — Other Ambulatory Visit: Payer: Self-pay | Admitting: Family

## 2015-11-03 DIAGNOSIS — R7989 Other specified abnormal findings of blood chemistry: Secondary | ICD-10-CM

## 2015-11-03 DIAGNOSIS — N183 Chronic kidney disease, stage 3 (moderate): Secondary | ICD-10-CM

## 2015-11-03 DIAGNOSIS — N179 Acute kidney failure, unspecified: Secondary | ICD-10-CM

## 2015-11-04 ENCOUNTER — Other Ambulatory Visit: Payer: Medicare Other

## 2015-11-04 DIAGNOSIS — N183 Chronic kidney disease, stage 3 unspecified: Secondary | ICD-10-CM

## 2015-11-04 DIAGNOSIS — N179 Acute kidney failure, unspecified: Secondary | ICD-10-CM

## 2015-11-04 DIAGNOSIS — R7989 Other specified abnormal findings of blood chemistry: Secondary | ICD-10-CM

## 2015-11-05 LAB — BMP8+EGFR
BUN / CREAT RATIO: 23 (ref 12–28)
BUN: 67 mg/dL — AB (ref 10–36)
CO2: 25 mmol/L (ref 18–29)
CREATININE: 2.88 mg/dL — AB (ref 0.57–1.00)
Calcium: 9.8 mg/dL (ref 8.7–10.3)
Chloride: 96 mmol/L (ref 96–106)
GFR, EST AFRICAN AMERICAN: 16 mL/min/{1.73_m2} — AB (ref >59)
GFR, EST NON AFRICAN AMERICAN: 14 mL/min/{1.73_m2} — AB (ref >59)
Glucose: 113 mg/dL — ABNORMAL HIGH (ref 65–99)
Potassium: 4.3 mmol/L (ref 3.5–5.2)
Sodium: 140 mmol/L (ref 134–144)

## 2015-11-16 ENCOUNTER — Ambulatory Visit (INDEPENDENT_AMBULATORY_CARE_PROVIDER_SITE_OTHER): Payer: Medicare Other | Admitting: Family

## 2015-11-16 ENCOUNTER — Encounter: Payer: Self-pay | Admitting: Family

## 2015-11-16 VITALS — BP 120/75 | HR 105 | Temp 97.1°F | Ht 66.0 in | Wt 233.4 lb

## 2015-11-16 DIAGNOSIS — N183 Chronic kidney disease, stage 3 unspecified: Secondary | ICD-10-CM

## 2015-11-16 DIAGNOSIS — I10 Essential (primary) hypertension: Secondary | ICD-10-CM

## 2015-11-16 DIAGNOSIS — R609 Edema, unspecified: Secondary | ICD-10-CM | POA: Diagnosis not present

## 2015-11-16 DIAGNOSIS — R748 Abnormal levels of other serum enzymes: Secondary | ICD-10-CM | POA: Diagnosis not present

## 2015-11-16 DIAGNOSIS — B372 Candidiasis of skin and nail: Secondary | ICD-10-CM | POA: Diagnosis not present

## 2015-11-16 DIAGNOSIS — R7989 Other specified abnormal findings of blood chemistry: Secondary | ICD-10-CM

## 2015-11-16 MED ORDER — NYSTATIN 100000 UNIT/GM EX POWD: Freq: Three times a day (TID) | CUTANEOUS | 0 refills | Status: DC

## 2015-11-16 MED ORDER — NYSTATIN-TRIAMCINOLONE 100000-0.1 UNIT/GM-% EX OINT: 1.0000 "application " | TOPICAL_OINTMENT | Freq: Two times a day (BID) | CUTANEOUS | 1 refills | Status: DC

## 2015-11-16 NOTE — Progress Notes (Signed)
   Subjective:    Patient ID: [name], female    DOB: 01-02-1924, 82 y.o.   MRN: [id number]  HPI PT presents to the office today to recheck creatinine. PT went to the ED on 10/11/15 for chronic kidney disease and hypotension. Pt's lasix was decreased to 40 mg a day. Pt had misunderstood and continued to take laxis 80 mg daily and her benicar 20 mg. PT has since decreased her lasix to 40 mg daily and discontinued her benicar. PT's BP is WNL. Pt denies any SOB, chest pain, edema, or weight gain.    Review of Systems  Constitutional: Positive for fatigue.  All other systems reviewed and are negative.      Objective:   Physical Exam  Constitutional: She is oriented to person, place, and time. She appears well-developed and well-nourished. No distress.  Morbid obese   HENT:  Head: Normocephalic and atraumatic.  Eyes: Pupils are equal, round, and reactive to light.  Neck: Normal range of motion. Neck supple. No thyromegaly present.  Cardiovascular: Normal rate, regular rhythm, normal heart sounds and intact distal pulses.   No murmur heard. Pulmonary/Chest: Effort normal and breath sounds normal. No respiratory distress. She has no wheezes.  Abdominal: Soft. Bowel sounds are normal. She exhibits no distension. There is no tenderness.  Musculoskeletal: Normal range of motion. She exhibits no edema or tenderness.  Neurological: She is alert and oriented to person, place, and time.  Skin: Skin is warm and dry. Rash noted.  Yeast rash under bilateral breast, groin, and lower back   Psychiatric: She has a normal mood and affect. Her behavior is normal. Judgment and thought content normal.  Vitals reviewed.     BP 120/75   Pulse (!) 105   Temp 97.1 F (36.2 C) (Oral)   Ht '5\' 6"'$  (1.676 m)   Wt 233 lb 6.4 oz (105.9 kg)   BMI 37.67 kg/m      Assessment & Plan:  1. Skin yeast infection -Keep clean and dry -Do not scratch - nystatin (NYSTATIN) powder; Apply topically 3  (three) times daily.  Dispense: 60 g; Refill: 0 - nystatin-triamcinolone ointment (MYCOLOG); Apply 1 application topically 2 (two) times daily.  Dispense: 90 g; Refill: 1 - BMP8+EGFR  2. Chronic kidney disease, stage III (moderate) - BMP8+EGFR  3. Essential hypertension, benign - BMP8+EGFR  4. Elevated serum creatinine - BMP8+EGFR  5. Edema, unspecified type - BMP8+EGFR   Continue all meds, continue lasix at 40 mg daily, continue to stop Benicar Labs pending Diet and exercise encouraged RTO 1 month  [name], [name]

## 2015-11-16 NOTE — Patient Instructions (Signed)
Cutaneous Candidiasis °Cutaneous candidiasis is a condition in which there is an overgrowth of yeast (candida) on the skin. Yeast normally live on the skin, but in small enough numbers not to cause any symptoms. In certain cases, increased growth of the yeast may cause an actual yeast infection. This kind of infection usually occurs in areas of the skin that are constantly warm and moist, such as the armpits or the groin. Yeast is the most common cause of diaper rash in babies and in people who cannot control their bowel movements (incontinence). °CAUSES  °The fungus that most often causes cutaneous candidiasis is Candida albicans. Conditions that can increase the risk of getting a yeast infection of the skin include: °· Obesity. °· Pregnancy. °· Diabetes. °· Taking antibiotic medicine. °· Taking birth control pills. °· Taking steroid medicines. °· Thyroid disease. °· An iron or zinc deficiency. °· Problems with the immune system. °SYMPTOMS  °· Red, swollen area of the skin. °· Bumps on the skin. °· Itchiness. °DIAGNOSIS  °The diagnosis of cutaneous candidiasis is usually based on its appearance. Light scrapings of the skin may also be taken and viewed under a microscope to identify the presence of yeast. °TREATMENT  °Antifungal creams may be applied to the infected skin. In severe cases, oral medicines may be needed.  °HOME CARE INSTRUCTIONS  °· Keep your skin clean and dry. °· Maintain a healthy weight. °· If you have diabetes, keep your blood sugar under control. °SEEK IMMEDIATE MEDICAL CARE IF: °· Your rash continues to spread despite treatment. °· You have a fever, chills, or abdominal pain. °  °This information is not intended to replace advice given to you by your health care provider. Make sure you discuss any questions you have with your health care provider. °  °Document Released: 12/19/2010 Document Revised: 06/25/2011 Document Reviewed: 10/04/2014 °Elsevier Interactive Patient Education ©2016 Elsevier  Inc. ° °

## 2015-11-17 LAB — BMP8+EGFR
BUN / CREAT RATIO: 17 (ref 12–28)
BUN: 33 mg/dL (ref 10–36)
CHLORIDE: 99 mmol/L (ref 96–106)
CO2: 24 mmol/L (ref 18–29)
CREATININE: 2 mg/dL — AB (ref 0.57–1.00)
Calcium: 10.2 mg/dL (ref 8.7–10.3)
GFR calc non Af Amer: 22 mL/min/{1.73_m2} — ABNORMAL LOW (ref >59)
GFR, EST AFRICAN AMERICAN: 25 mL/min/{1.73_m2} — AB (ref >59)
Glucose: 93 mg/dL (ref 65–99)
POTASSIUM: 4.3 mmol/L (ref 3.5–5.2)
SODIUM: 141 mmol/L (ref 134–144)

## 2015-11-21 ENCOUNTER — Other Ambulatory Visit: Payer: Self-pay | Admitting: Family Medicine

## 2015-12-11 ENCOUNTER — Other Ambulatory Visit: Payer: Self-pay | Admitting: Family Medicine

## 2015-12-26 ENCOUNTER — Ambulatory Visit (INDEPENDENT_AMBULATORY_CARE_PROVIDER_SITE_OTHER): Payer: Medicare Other | Admitting: Family

## 2015-12-26 ENCOUNTER — Encounter: Payer: Self-pay | Admitting: Family

## 2015-12-26 VITALS — BP 135/69 | HR 101 | Temp 97.4°F | Ht 66.0 in | Wt 224.4 lb

## 2015-12-26 DIAGNOSIS — I10 Essential (primary) hypertension: Secondary | ICD-10-CM | POA: Diagnosis not present

## 2015-12-26 DIAGNOSIS — R7989 Other specified abnormal findings of blood chemistry: Secondary | ICD-10-CM

## 2015-12-26 DIAGNOSIS — N179 Acute kidney failure, unspecified: Secondary | ICD-10-CM | POA: Diagnosis not present

## 2015-12-26 DIAGNOSIS — R748 Abnormal levels of other serum enzymes: Secondary | ICD-10-CM

## 2015-12-26 DIAGNOSIS — B360 Pityriasis versicolor: Secondary | ICD-10-CM | POA: Diagnosis not present

## 2015-12-26 DIAGNOSIS — N183 Chronic kidney disease, stage 3 unspecified: Secondary | ICD-10-CM

## 2015-12-26 LAB — CMP14+EGFR
A/G RATIO: 1.4 (ref 1.2–2.2)
ALBUMIN: 4.2 g/dL (ref 3.2–4.6)
ALT: 10 IU/L (ref 0–32)
AST: 17 IU/L (ref 0–40)
Alkaline Phosphatase: 46 IU/L (ref 39–117)
BILIRUBIN TOTAL: 0.3 mg/dL (ref 0.0–1.2)
BUN / CREAT RATIO: 24 (ref 12–28)
BUN: 57 mg/dL — AB (ref 10–36)
CALCIUM: 10.7 mg/dL — AB (ref 8.7–10.3)
CHLORIDE: 93 mmol/L — AB (ref 96–106)
CO2: 31 mmol/L — ABNORMAL HIGH (ref 18–29)
Creatinine, Ser: 2.36 mg/dL — ABNORMAL HIGH (ref 0.57–1.00)
GFR, EST AFRICAN AMERICAN: 20 mL/min/{1.73_m2} — AB (ref >59)
GFR, EST NON AFRICAN AMERICAN: 18 mL/min/{1.73_m2} — AB (ref >59)
GLUCOSE: 93 mg/dL (ref 65–99)
Globulin, Total: 2.9 g/dL (ref 1.5–4.5)
Potassium: 3.6 mmol/L (ref 3.5–5.2)
Sodium: 139 mmol/L (ref 134–144)
Total Protein: 7.1 g/dL (ref 6.0–8.5)

## 2015-12-26 NOTE — Progress Notes (Signed)
   Subjective:    Patient ID: [name], female    DOB: [birth year], 82 y.o.   MRN: [id number]  Pt presents to the office today to recheck HTN, chronic kidney disease, and rash. PT's BP is WNL today. Pt's rash is gradually improved.   Hypertension  This is a chronic problem. The current episode started more than 1 year ago. The problem has been resolved since onset. The problem is controlled. Pertinent negatives include no headaches, malaise/fatigue, palpitations, peripheral edema or shortness of breath. Risk factors for coronary artery disease include dyslipidemia and obesity. The current treatment provides mild improvement.  Rash  This is a chronic problem. The current episode started more than 1 year ago. The problem has been waxing and waning since onset. The affected locations include the back. The rash is characterized by dryness and itchiness. She was exposed to nothing. Pertinent negatives include no shortness of breath. Past treatments include anti-itch cream, antihistamine and moisturizer. The treatment provided mild relief.      Review of Systems  Constitutional: Negative for malaise/fatigue.  Respiratory: Negative for shortness of breath.   Cardiovascular: Negative for palpitations.  Skin: Positive for rash.  Neurological: Negative for headaches.  All other systems reviewed and are negative.      Objective:   Physical Exam  Constitutional: She is oriented to person, place, and time. She appears well-developed and well-nourished. No distress.  HENT:  Head: Normocephalic and atraumatic.  Eyes: Pupils are equal, round, and reactive to light.  Neck: Normal range of motion. Neck supple. No thyromegaly present.  Cardiovascular: Normal rate, regular rhythm, normal heart sounds and intact distal pulses.   No murmur heard. Pulmonary/Chest: Effort normal and breath sounds normal. No respiratory distress. She has no wheezes.  Abdominal: Soft. Bowel sounds are normal. She exhibits  no distension. There is no tenderness.  Musculoskeletal: Normal range of motion. She exhibits no edema or tenderness.  Neurological: She is alert and oriented to person, place, and time.  Skin: Skin is warm and dry. Rash noted.  Fungal rash under bilateral breast and abdomen, improved from last visit  Psychiatric: She has a normal mood and affect. Her behavior is normal. Judgment and thought content normal.  Vitals reviewed.     BP 135/69   Pulse (!) 101   Temp 97.4 F (36.3 C) (Oral)   Ht '5\' 6"'$  (1.676 m)   Wt 224 lb 6.4 oz (101.8 kg)   BMI 36.22 kg/m      Assessment & Plan:  1. Essential hypertension, benign -Labs pending -Dash diet information given -Exercise encouraged - Stress Management  -Continue current meds -RTO in 3 months - CMP14+EGFR  2. Acute renal failure superimposed on stage 3 chronic kidney disease ([place]) - CMP14+EGFR  3. Elevated serum creatinine - CMP14+EGFR  4. Tinea versicolor -Continue nystatin -Do not scratch -start using Head and Shoulders during shower   Continue all meds Labs pending Health Maintenance reviewed Diet and exercise encouraged RTO [place], [name]

## 2015-12-26 NOTE — Patient Instructions (Signed)
Tinea Versicolor Tinea versicolor is a common fungal infection of the skin. It causes a rash that appears as light or dark patches on the skin. The rash most often occurs on the chest, back, neck, or upper arms. This condition is more common during warm weather. Other than affecting how your skin looks, tinea versicolor usually does not cause other problems. In most cases, the infection goes away in a few weeks with treatment. It may take a few months for the patches on your skin to clear up. CAUSES Tinea versicolor occurs when a type of fungus that is normally present on the skin starts to overgrow. This fungus is a kind of yeast. The exact cause of the overgrowth is not known. This condition cannot be passed from one person to another (noncontagious). RISK FACTORS This condition is more likely to develop when certain factors are present, such as:  Heat and humidity.  Sweating too much.  Hormone changes.  Oily skin.  A weak defense (immune) system. SYMPTOMS Symptoms of this condition may include:  A rash on your skin that is made up of light or dark patches. The rash may have:  Patches of tan or pink spots on light skin.  Patches of white or brown spots on dark skin.  Patches of skin that do not tan.  Well-marked edges.  Scales on the discolored areas.  Mild itching. DIAGNOSIS A health care provider can usually diagnose this condition by looking at your skin. During the exam, he or she may use ultraviolet light to help determine the extent of the infection. In some cases, a skin sample may be taken by scraping the rash. This sample will be viewed under a microscope to check for yeast overgrowth. TREATMENT Treatment for this condition may include:  Dandruff shampoo that is applied to the affected skin during showers or bathing.  Over-the-counter medicated skin cream, lotion, or soaps.  Prescription antifungal medicine in the form of skin cream or pills.  Medicine to help  reduce itching. HOME CARE INSTRUCTIONS  Take medicines only as directed by your health care provider.  Apply dandruff shampoo to the affected area if told to do so by your health care provider. You may be instructed to scrub the affected skin for several minutes each day.  Do not scratch the affected area of skin.  Avoid hot and humid conditions.  Do not use tanning booths.  Try to avoid sweating a lot. SEEK MEDICAL CARE IF:  Your symptoms get worse.  You have a fever.  You have redness, swelling, or pain at the site of your rash.  You have fluid, blood, or pus coming from your rash.  Your rash returns after treatment.   This information is not intended to replace advice given to you by your health care provider. Make sure you discuss any questions you have with your health care provider.   Document Released: 03/30/2000 Document Revised: 04/23/2014 Document Reviewed: 01/12/2014 Elsevier Interactive Patient Education 2016 Elsevier Inc.  

## 2015-12-27 ENCOUNTER — Other Ambulatory Visit: Payer: Self-pay | Admitting: Family

## 2015-12-27 MED ORDER — FUROSEMIDE 20 MG PO TABS: 20.0000 mg | ORAL_TABLET | Freq: Every day | ORAL | 1 refills | Status: DC

## 2016-01-04 ENCOUNTER — Other Ambulatory Visit: Payer: Self-pay | Admitting: Family Medicine

## 2016-01-13 ENCOUNTER — Other Ambulatory Visit: Payer: Self-pay | Admitting: Neurology

## 2016-02-17 ENCOUNTER — Other Ambulatory Visit: Payer: Self-pay | Admitting: Family

## 2016-02-19 ENCOUNTER — Other Ambulatory Visit: Payer: Self-pay | Admitting: Family

## 2016-02-27 ENCOUNTER — Emergency Department (HOSPITAL_COMMUNITY): Payer: Medicare Other

## 2016-02-27 ENCOUNTER — Encounter (HOSPITAL_COMMUNITY): Payer: Self-pay | Admitting: Emergency Medicine

## 2016-02-27 ENCOUNTER — Emergency Department (HOSPITAL_COMMUNITY)
Admission: EM | Admit: 2016-02-27 | Discharge: 2016-02-28 | Disposition: A | Discharge status: Home / Self Care | Payer: Medicare Other | Attending: Emergency Medicine | Admitting: Emergency Medicine

## 2016-02-27 DIAGNOSIS — N183 Chronic kidney disease, stage 3 (moderate): Secondary | ICD-10-CM | POA: Diagnosis not present

## 2016-02-27 DIAGNOSIS — M7989 Other specified soft tissue disorders: Secondary | ICD-10-CM | POA: Diagnosis not present

## 2016-02-27 DIAGNOSIS — Z79899 Other long term (current) drug therapy: Secondary | ICD-10-CM | POA: Insufficient documentation

## 2016-02-27 DIAGNOSIS — I129 Hypertensive chronic kidney disease with stage 1 through stage 4 chronic kidney disease, or unspecified chronic kidney disease: Secondary | ICD-10-CM | POA: Diagnosis not present

## 2016-02-27 DIAGNOSIS — Z7982 Long term (current) use of aspirin: Secondary | ICD-10-CM | POA: Diagnosis not present

## 2016-02-27 LAB — CBC WITH DIFFERENTIAL/PLATELET
BASOS ABS: 0.1 10*3/uL (ref 0.0–0.1)
BASOS PCT: 1 %
EOS ABS: 0.8 10*3/uL — AB (ref 0.0–0.7)
EOS PCT: 10 %
HCT: 28.1 % — ABNORMAL LOW (ref 36.0–46.0)
HEMOGLOBIN: 9.1 g/dL — AB (ref 12.0–15.0)
Lymphocytes Relative: 39 %
Lymphs Abs: 3.2 10*3/uL (ref 0.7–4.0)
MCH: 23.7 pg — AB (ref 26.0–34.0)
MCHC: 32.4 g/dL (ref 30.0–36.0)
MCV: 73.2 fL — ABNORMAL LOW (ref 78.0–100.0)
Monocytes Absolute: 0.3 10*3/uL (ref 0.1–1.0)
Monocytes Relative: 4 %
NEUTROS PCT: 46 %
Neutro Abs: 3.9 10*3/uL (ref 1.7–7.7)
PLATELETS: 147 10*3/uL — AB (ref 150–400)
RBC: 3.84 MIL/uL — AB (ref 3.87–5.11)
RDW: 16.5 % — ABNORMAL HIGH (ref 11.5–15.5)
WBC: 8.4 10*3/uL (ref 4.0–10.5)

## 2016-02-27 LAB — BASIC METABOLIC PANEL
Anion gap: 5 (ref 5–15)
BUN: 20 mg/dL (ref 6–20)
CO2: 26 mmol/L (ref 22–32)
Calcium: 9.5 mg/dL (ref 8.9–10.3)
Chloride: 108 mmol/L (ref 101–111)
Creatinine, Ser: 1.47 mg/dL — ABNORMAL HIGH (ref 0.44–1.00)
GFR, EST AFRICAN AMERICAN: 35 mL/min — AB (ref >60)
GFR, EST NON AFRICAN AMERICAN: 30 mL/min — AB (ref >60)
Glucose, Bld: 86 mg/dL (ref 65–99)
POTASSIUM: 4 mmol/L (ref 3.5–5.1)
SODIUM: 139 mmol/L (ref 135–145)

## 2016-02-27 MED ORDER — CEPHALEXIN 250 MG PO CAPS: 250.0000 mg | ORAL_CAPSULE | Freq: Four times a day (QID) | ORAL | 0 refills | Status: DC

## 2016-02-27 NOTE — ED Notes (Signed)
Pt taken to xray via stretcher  

## 2016-02-27 NOTE — ED Notes (Signed)
This nurse and patient spoke with pt daughter on speaker phone with pt permission to speak about medical tests, results, and plan.  Informed daughter of plan including xray and blood work. Informed daughter that I did not anticipate her mother to be admitted to hospital.  Will call daughter with disposition.

## 2016-02-27 NOTE — ED Provider Notes (Signed)
[facility] DEPT Provider Note   CSN: [id number] Arrival date & time: 02/27/16  1859     History   Chief Complaint Chief Complaint  Patient presents with  . Leg Swelling  . Wound Infection    HPI [name] is a 82 y.o. female.  HPI Patient presents to the emergency room with complaints of foot swelling and a rash. She states she's had a diffuse rash for the last 6 months. It involves areas in her back, abdomen, as well as her lower extremities. She has seen her dermatologist and has been treated for a possible fungal infection according to the patient. She has various creams that she puts on her rash. She has had a rash on the dorsal aspect of her right big toe as well as some chronic discoloration. However today she started noticing increasing swelling. This concerned her so she came to the emergency room for evaluation. She denies any fevers or chills. No recent injuries. Past Medical History:  Diagnosis Date  . Chronic low back pain   . Degenerative arthritis   . Eczema   . Edema   . Gout   . Headache   . Hiatal hernia   . Hypertension   . Internal hemorrhoids   . Neurogenic claudication   . Obesity   . Seizure disorder ([place])   . Seizures ([place])   . Stroke [facility])     Patient Active Problem List   Diagnosis Date Noted  . Acute renal failure superimposed on stage 3 chronic kidney disease ([place]) 10/11/2015  . Dehydration 10/11/2015  . Failure to thrive in adult 10/11/2015  . Hypotension 10/11/2015  . Stroke ([place])   . Neurogenic claudication   . Obesity   . Edema   . Fluid retention 09/30/2014  . Elevated serum creatinine 09/30/2014  . Essential hypertension, benign 09/30/2014  . Secondary hyperparathyroidism, renal ([place]) 09/30/2014  . Proteinuria 09/30/2014  . Anemia associated with chronic renal failure 09/30/2014  . Chronic kidney disease, stage III (moderate) 09/30/2014  . Hypertension 08/21/2012  . Hiatal hernia 08/21/2012  . Seizures ([place])  08/21/2012    Past Surgical History:  Procedure Laterality Date  . ABDOMINAL HYSTERECTOMY    . APPENDECTOMY    . APPENDECTOMY    . bilateral knees    . cataracts    . CHOLECYSTECTOMY    . mammoplasty reduction    . NEUROPLASTY / TRANSPOSITION MEDIAN NERVE AT CARPAL TUNNEL BILATERAL    . RE-EXCISION OF BREAST LUMPECTOMY    . TUBAL LIGATION      OB History    Gravida Para Term Preterm AB Living   6 4 4   2      SAB TAB Ectopic Multiple Live Births   2               Home Medications    Prior to Admission medications   Medication Sig Start Date End Date Taking? Authorizing Provider  albuterol (PROVENTIL HFA;VENTOLIN HFA) 108 (90 BASE) MCG/ACT inhaler Inhale 2 puffs into the lungs every 6 (six) hours as needed for wheezing or shortness of breath. 01/03/15  Yes Chipper Herb, MD  albuterol (PROVENTIL) (2.5 MG/3ML) 0.083% nebulizer solution Take 3 mLs (2.5 mg total) by nebulization at bedtime as needed for wheezing or shortness of breath. Patient taking differently: Take 2.5 mg by nebulization every 6 (six) hours as needed for wheezing or shortness of breath.  01/03/15  Yes Chipper Herb, MD  aspirin 81 MG tablet Take  81 mg by mouth daily.   Yes Historical Provider, MD  aspirin-sod bicarb-citric acid (ALKA-SELTZER) 325 MG TBEF tablet Take 325-650 mg by mouth daily as needed (for pain).   Yes Historical Provider, MD  diphenhydrAMINE (BENADRYL) 25 MG tablet Take 25 mg by mouth every morning.   Yes Historical Provider, MD  levETIRAcetam (KEPPRA XR) 500 MG 24 hr tablet TAKE 3 TABLETS (1,500 MG TOTAL) BY MOUTH AT BEDTIME. 01/13/16  Yes Kathrynn Ducking, MD  nystatin (NYSTATIN) powder Apply topically 3 (three) times daily. 11/16/15  Yes [name], [name]  nystatin-triamcinolone ointment (MYCOLOG) Apply 1 application topically 2 (two) times daily. 11/16/15  Yes [name], [name]  ULORIC 80 MG TABS TAKE 1 TABLET EVERY DAY 02/17/16  Yes [name], [name]  cephALEXin (KEFLEX) 250 MG capsule  Take 1 capsule (250 mg total) by mouth 4 (four) times daily. 02/27/16   Dorie Rank, MD  ferrous sulfate 325 (65 FE) MG tablet Take 325 mg by mouth every Monday, Wednesday, and Friday.     Historical Provider, MD  furosemide (LASIX) 20 MG tablet TAKE 1 TABLET (20 MG TOTAL) BY MOUTH DAILY. Patient not taking: Reported on 02/27/2016 02/20/16   [name], [name]  hydrOXYzine (ATARAX/VISTARIL) 50 MG tablet TAKE 1 TABLET (50 MG TOTAL) BY MOUTH EVERY 8 (EIGHT) HOURS AS NEEDED FOR ITCHING. Patient not taking: Reported on 11/16/2015 09/30/15   [name], [name]  PHENobarbital (LUMINAL) 97.2 MG tablet TAKE 1 TABLET EVERY DAY Patient not taking: Reported on 02/27/2016 05/30/15   Chipper Herb, MD    Family History Family History  Problem Relation Age of Onset  . Diabetes Mother   . Heart disease Mother   . Heart disease Father   . Cancer Sister   . Cancer Brother   . Cancer Sister     Social History Social History  Substance Use Topics  . Smoking status: Never Smoker  . Smokeless tobacco: Never Used  . Alcohol use No     Allergies   Advicor [niacin-lovastatin er]; Caduet [amlodipine-atorvastatin]; Codeine; Dilantin [phenytoin]; Lescol [fluvastatin]; Pamelor [nortriptyline]; Penicillins; Sulfa antibiotics; Trileptal [oxcarbazepine]; Ultram [tramadol]; and Welchol [colesevelam]   Review of Systems Review of Systems  All other systems reviewed and are negative.    Physical Exam Updated Vital Signs BP 162/88   Pulse 85   Temp 98.1 F (36.7 C) (Oral)   Resp 17   Ht 5\' 6"  (1.676 m)   Wt 105.2 kg   SpO2 99%   BMI 37.45 kg/m   Physical Exam  Constitutional: No distress.  Overweight  HENT:  Head: Normocephalic and atraumatic.  Right Ear: External ear normal.  Left Ear: External ear normal.  Eyes: Conjunctivae are normal. Right eye exhibits no discharge. Left eye exhibits no discharge. No scleral icterus.  Neck: Neck supple. No tracheal deviation present.  Cardiovascular:  Normal rate, regular rhythm and intact distal pulses.   Pulmonary/Chest: Effort normal and breath sounds normal. No stridor. No respiratory distress. She has no wheezes. She has no rales.  Abdominal: Soft. Bowel sounds are normal. She exhibits no distension. There is no tenderness. There is no rebound and no guarding.  Musculoskeletal: She exhibits edema. She exhibits no tenderness.  Bilateral onychomycosis of the great toes, dark discoloration of the dorsal aspect of the right big toe, distal tip of the toe is pink and well perfused, normal pulses bilaterally, mild edema without any surrounding erythema, no increased warmth or purulent drainage  Neurological:  She is alert. She has normal strength. No cranial nerve deficit (no facial droop, extraocular movements intact, no slurred speech) or sensory deficit. She exhibits normal muscle tone. She displays no seizure activity. Coordination normal.  Skin: Skin is warm and dry. Rash noted.  Few areas of erythematous plaques with scaling, no surrounding erythema  Psychiatric: She has a normal mood and affect.  Nursing note and vitals reviewed.    ED Treatments / Results  Labs (all labs ordered are listed, but only abnormal results are displayed) Labs Reviewed  CBC WITH DIFFERENTIAL/PLATELET - Abnormal; Notable for the following:       Result Value   RBC 3.84 (*)    Hemoglobin 9.1 (*)    HCT 28.1 (*)    MCV 73.2 (*)    MCH 23.7 (*)    RDW 16.5 (*)    Platelets 147 (*)    Eosinophils Absolute 0.8 (*)    All other components within normal limits  BASIC METABOLIC PANEL - Abnormal; Notable for the following:    Creatinine, Ser 1.47 (*)    GFR calc non Af Amer 30 (*)    GFR calc Af Amer 35 (*)    All other components within normal limits     Radiology Dg Foot Complete Right  Result Date: 02/27/2016 CLINICAL DATA:  Acute onset of right great toe discoloration and diffuse right toe swelling. Initial encounter. EXAM: RIGHT FOOT COMPLETE - 3+  VIEW COMPARISON:  Right ankle radiographs performed 03/07/2005 FINDINGS: There is no evidence of fracture or dislocation. The joint spaces are preserved. There is no evidence of talar subluxation; the subtalar joint is unremarkable in appearance. An os peroneum is noted. Plantar and posterior calcaneal spurs are seen. Mild degenerative change is noted at the midfoot. Mild soft tissue swelling is noted at the forefoot. IMPRESSION: 1. No evidence of fracture or dislocation. 2. Os peroneum noted. Electronically Signed   By: Garald Balding M.D.   On: 02/27/2016 20:48    Procedures Procedures (including critical care time)  Medications Ordered in ED Medications - No data to display   Initial Impression / Assessment and Plan / ED Course  I have reviewed the triage vital signs and the nursing notes.  Pertinent labs & imaging results that were available during my care of the patient were reviewed by me and considered in my medical decision making (see chart for details).  Clinical Course    The discoloration on the patient's toe appears chronic. She has no evidence of lymphangitic streaking, no increased warmth.  However, the patient complains there is new swelling today.  I will start her on a course of Keflex for the possibility of mild early cellulitis. I recommend follow-up with her primary doctor and dermatologist  Final Clinical Impressions(s) / ED Diagnoses   Final diagnoses:  Toe swelling    New Prescriptions New Prescriptions   CEPHALEXIN (KEFLEX) 250 MG CAPSULE    Take 1 capsule (250 mg total) by mouth 4 (four) times daily.     Dorie Rank, MD 02/27/16 [id number]

## 2016-02-27 NOTE — Discharge Instructions (Signed)
Take the medications as prescribed, follow up with your primary care doctor and or dermatologist

## 2016-02-27 NOTE — ED Notes (Signed)
Spoke with pt daughter, [name] who says she is unable to come pick up pt for discharge. Pt son, Eddie Dibbles called and spoke with pt and myself on speaker phone informing him of situation with pt permission. Eddie Dibbles is coming from [place] to pick up pt for discharge. Informed Eddie Dibbles that his mother was resting in stretcher beside nursing desk and had been given warm blanket and is comfortable. Eddie Dibbles expressed gratitude and said he was on his way.

## 2016-02-27 NOTE — ED Triage Notes (Signed)
Pt reports LE edema with clear blistering, has been treated by PCP but continues to worsen. Pt also has infection in her R great toe.

## 2016-02-29 ENCOUNTER — Other Ambulatory Visit: Payer: Self-pay | Admitting: Family Medicine

## 2016-03-10 ENCOUNTER — Emergency Department (HOSPITAL_COMMUNITY): Payer: Medicare Other

## 2016-03-10 ENCOUNTER — Emergency Department (HOSPITAL_COMMUNITY)
Admission: EM | Admit: 2016-03-10 | Discharge: 2016-03-10 | Disposition: A | Discharge status: Home / Self Care | Payer: Medicare Other | Attending: Emergency Medicine | Admitting: Emergency Medicine

## 2016-03-10 ENCOUNTER — Encounter (HOSPITAL_COMMUNITY): Payer: Self-pay | Admitting: Emergency Medicine

## 2016-03-10 DIAGNOSIS — Z8673 Personal history of transient ischemic attack (TIA), and cerebral infarction without residual deficits: Secondary | ICD-10-CM | POA: Insufficient documentation

## 2016-03-10 DIAGNOSIS — K5900 Constipation, unspecified: Secondary | ICD-10-CM | POA: Insufficient documentation

## 2016-03-10 DIAGNOSIS — I129 Hypertensive chronic kidney disease with stage 1 through stage 4 chronic kidney disease, or unspecified chronic kidney disease: Secondary | ICD-10-CM | POA: Insufficient documentation

## 2016-03-10 DIAGNOSIS — Z7982 Long term (current) use of aspirin: Secondary | ICD-10-CM | POA: Diagnosis not present

## 2016-03-10 DIAGNOSIS — N183 Chronic kidney disease, stage 3 (moderate): Secondary | ICD-10-CM | POA: Insufficient documentation

## 2016-03-10 MED ORDER — ACETAMINOPHEN 325 MG PO TABS
650.0000 mg | ORAL_TABLET | Freq: Once | ORAL | Status: AC
  Administered 2016-03-10: 650 mg via ORAL
  Filled 2016-03-10: qty 2

## 2016-03-10 NOTE — ED Notes (Signed)
Bed: FK92 Expected date: 03/10/16 Expected time: 3:52 PM Means of arrival: Ambulance Comments:  Rm 19 Constipation

## 2016-03-10 NOTE — Discharge Instructions (Signed)
Please read attached information. If you experience any new or worsening signs or symptoms please return to the emergency room for evaluation. Please follow-up with your primary care provider or specialist as discussed.  °

## 2016-03-10 NOTE — ED Triage Notes (Signed)
Patient here via EMS from home with complaints of constipation. No bowel movement since Wednesday. Denies n/v.

## 2016-03-10 NOTE — ED Provider Notes (Signed)
[facility] DEPT Provider Note   CSN: [id number] Arrival date & time: 03/10/16  1554   History   Chief Complaint Chief Complaint  Patient presents with  . Constipation    HPI [name] is a 82 y.o. female.  HPI    Abdominal pain since Wednesday not passing stool or gas, no nausea or vomiting, decreased PO intake, drinking water. Patient started to settle reports she is constipated, history of the same, reports that she needs disimpaction. She denies any other concerning etiology. No history of abdominal surgeries.     Past Medical History:  Diagnosis Date  . Chronic low back pain   . Degenerative arthritis   . Eczema   . Edema   . Gout   . Headache   . Hiatal hernia   . Hypertension   . Internal hemorrhoids   . Neurogenic claudication   . Obesity   . Seizure disorder ([place])   . Seizures ([place])   . Stroke [facility])     Patient Active Problem List   Diagnosis Date Noted  . Acute renal failure superimposed on stage 3 chronic kidney disease ([place]) 10/11/2015  . Dehydration 10/11/2015  . Failure to thrive in adult 10/11/2015  . Hypotension 10/11/2015  . Stroke ([place])   . Neurogenic claudication   . Obesity   . Edema   . Fluid retention 09/30/2014  . Elevated serum creatinine 09/30/2014  . Essential hypertension, benign 09/30/2014  . Secondary hyperparathyroidism, renal ([place]) 09/30/2014  . Proteinuria 09/30/2014  . Anemia associated with chronic renal failure 09/30/2014  . Chronic kidney disease, stage III (moderate) 09/30/2014  . Hypertension 08/21/2012  . Hiatal hernia 08/21/2012  . Seizures ([place]) 08/21/2012    Past Surgical History:  Procedure Laterality Date  . ABDOMINAL HYSTERECTOMY    . APPENDECTOMY    . APPENDECTOMY    . bilateral knees    . cataracts    . CHOLECYSTECTOMY    . mammoplasty reduction    . NEUROPLASTY / TRANSPOSITION MEDIAN NERVE AT CARPAL TUNNEL BILATERAL    . RE-EXCISION OF BREAST LUMPECTOMY    . TUBAL LIGATION      OB  History    Gravida Para Term Preterm AB Living   6 4 4   2      SAB TAB Ectopic Multiple Live Births   2               Home Medications    Prior to Admission medications   Medication Sig Start Date End Date Taking? Authorizing Provider  albuterol (PROVENTIL HFA;VENTOLIN HFA) 108 (90 BASE) MCG/ACT inhaler Inhale 2 puffs into the lungs every 6 (six) hours as needed for wheezing or shortness of breath. 01/03/15   Chipper Herb, MD  albuterol (PROVENTIL) (2.5 MG/3ML) 0.083% nebulizer solution Take 3 mLs (2.5 mg total) by nebulization at bedtime as needed for wheezing or shortness of breath. Patient taking differently: Take 2.5 mg by nebulization every 6 (six) hours as needed for wheezing or shortness of breath.  01/03/15   Chipper Herb, MD  aspirin 81 MG tablet Take 81 mg by mouth daily.    Historical Provider, MD  aspirin-sod bicarb-citric acid (ALKA-SELTZER) 325 MG TBEF tablet Take 325-650 mg by mouth daily as needed (for pain).    Historical Provider, MD  cephALEXin (KEFLEX) 250 MG capsule Take 1 capsule (250 mg total) by mouth 4 (four) times daily. 02/27/16   Dorie Rank, MD  diphenhydrAMINE (BENADRYL) 25 MG tablet Take 25 mg  by mouth every morning.    Historical Provider, MD  ferrous sulfate 325 (65 FE) MG tablet Take 325 mg by mouth every Monday, Wednesday, and Friday.     Historical Provider, MD  furosemide (LASIX) 20 MG tablet TAKE 1 TABLET (20 MG TOTAL) BY MOUTH DAILY. Patient not taking: Reported on 02/27/2016 02/20/16   [name], [name]  hydrOXYzine (ATARAX/VISTARIL) 50 MG tablet TAKE 1 TABLET (50 MG TOTAL) BY MOUTH EVERY 8 (EIGHT) HOURS AS NEEDED FOR ITCHING. Patient not taking: Reported on 11/16/2015 09/30/15   [name], [name]  levETIRAcetam (KEPPRA XR) 500 MG 24 hr tablet TAKE 3 TABLETS (1,500 MG TOTAL) BY MOUTH AT BEDTIME. 01/13/16   Kathrynn Ducking, MD  metolazone (ZAROXOLYN) 2.5 MG tablet TAKE 1 TABLET ON MON, WED, AND FRIDAY 02/29/16   [name], [name]  nystatin  (NYSTATIN) powder Apply topically 3 (three) times daily. 11/16/15   [name], [name]  nystatin-triamcinolone ointment (MYCOLOG) Apply 1 application topically 2 (two) times daily. 11/16/15   [name], [name]  PHENobarbital (LUMINAL) 97.2 MG tablet TAKE 1 TABLET EVERY DAY Patient not taking: Reported on 02/27/2016 05/30/15   Chipper Herb, MD  ULORIC 31 MG TABS TAKE 1 TABLET EVERY DAY 02/17/16   [name], [name]    Family History Family History  Problem Relation Age of Onset  . Diabetes Mother   . Heart disease Mother   . Heart disease Father   . Cancer Sister   . Cancer Brother   . Cancer Sister     Social History Social History  Substance Use Topics  . Smoking status: Never Smoker  . Smokeless tobacco: Never Used  . Alcohol use No     Allergies   Advicor [niacin-lovastatin er]; Caduet [amlodipine-atorvastatin]; Codeine; Dilantin [phenytoin]; Lescol [fluvastatin]; Pamelor [nortriptyline]; Penicillins; Sulfa antibiotics; Trileptal [oxcarbazepine]; Ultram [tramadol]; and Welchol [colesevelam]   Review of Systems Review of Systems  All other systems reviewed and are negative.   Physical Exam Updated Vital Signs BP 162/98 (BP Location: Left Arm)   Pulse 105   Temp 98.5 F (36.9 C) (Oral)   Resp 18   SpO2 97%   Physical Exam  Constitutional: She is oriented to person, place, and time. She appears well-developed and well-nourished.  HENT:  Head: Normocephalic and atraumatic.  Eyes: Conjunctivae are normal. Pupils are equal, round, and reactive to light. Right eye exhibits no discharge. Left eye exhibits no discharge. No scleral icterus.  Neck: Normal range of motion. No JVD present. No tracheal deviation present.  Pulmonary/Chest: Effort normal. No stridor.  Abdominal: Soft. She exhibits no distension and no mass. There is no tenderness. There is no rebound and no guarding. No hernia.  Genitourinary:  Genitourinary Comments: Numerous impacted stool balls    Neurological: She is alert and oriented to person, place, and time. Coordination normal.  Psychiatric: She has a normal mood and affect. Her behavior is normal. Judgment and thought content normal.  Nursing note and vitals reviewed.   ED Treatments / Results  Labs (all labs ordered are listed, but only abnormal results are displayed) Labs Reviewed - No data to display  EKG  EKG Interpretation None       Radiology Dg Abdomen 1 View  Result Date: 03/10/2016 CLINICAL DATA:  Constipation. EXAM: ABDOMEN - 1 VIEW COMPARISON:  None. FINDINGS: Visualized bowel gas pattern is nonobstructive. No evidence of soft tissue mass or abnormal fluid collection. No evidence of free intraperitoneal air. Cholecystectomy  clips noted in the right upper quadrant. Lung bases appear clear. Degenerative changes of the thoracolumbar spine, at least moderate in degree. No acute appearing osseous abnormality. IMPRESSION: Nonobstructive bowel gas pattern and no evidence of acute intra-abdominal abnormality. Electronically Signed   By: Franki Cabot M.D.   On: 03/10/2016 17:40    Procedures Procedures (including critical care time)  Medications Ordered in ED Medications  acetaminophen (TYLENOL) tablet 650 mg (not administered)     Initial Impression / Assessment and Plan / ED Course  I have reviewed the triage vital signs and the nursing notes.  Pertinent labs & imaging results that were available during my care of the patient were reviewed by me and considered in my medical decision making (see chart for details).  Clinical Course     Labs:   Imaging:  Consults:  Therapeutics:  Discharge Meds:   Assessment/Plan:  Patient presents with constipation secondary to rectal impaction. I disimpacted a large volume of stool. Patient reports immediately relief of discomfort. No concern for bowel obstruction are concerning etiology. Patient be discharged home with her daughter with primary care  follow-up, instructions to drink plenty of water, fiber. The patient and her daughter verbalize understanding and agreement to today's plan had no further questions or concerns.   Final Clinical Impressions(s) / ED Diagnoses   Final diagnoses:  Constipation, unspecified constipation type    New Prescriptions New Prescriptions   No medications on file     Okey Regal, PA-C 03/10/16 2010    Sherwood Gambler, MD 03/19/16 [phone]

## 2016-03-11 ENCOUNTER — Other Ambulatory Visit: Payer: Self-pay | Admitting: Family

## 2016-03-15 ENCOUNTER — Other Ambulatory Visit: Payer: Self-pay | Admitting: Family

## 2016-05-14 ENCOUNTER — Telehealth: Payer: Self-pay | Admitting: Family

## 2016-05-14 NOTE — Telephone Encounter (Signed)
LMOVM NTBS. Last OV here in our office was 12/2015. To noted ED visits were in November.

## 2016-05-17 ENCOUNTER — Telehealth: Payer: Self-pay | Admitting: Family

## 2016-05-17 NOTE — Telephone Encounter (Signed)
Pt given appt with [name] tomorrow at 9:40.

## 2016-05-18 ENCOUNTER — Ambulatory Visit (INDEPENDENT_AMBULATORY_CARE_PROVIDER_SITE_OTHER): Payer: Medicare Other | Admitting: Family

## 2016-05-18 ENCOUNTER — Encounter: Payer: Self-pay | Admitting: Family

## 2016-05-18 VITALS — BP 139/83 | HR 97 | Temp 98.3°F | Ht 66.0 in | Wt 223.8 lb

## 2016-05-18 DIAGNOSIS — L209 Atopic dermatitis, unspecified: Secondary | ICD-10-CM

## 2016-05-18 DIAGNOSIS — B372 Candidiasis of skin and nail: Secondary | ICD-10-CM | POA: Diagnosis not present

## 2016-05-18 MED ORDER — FLUCONAZOLE 150 MG PO TABS: 150.0000 mg | ORAL_TABLET | ORAL | 0 refills | Status: DC | PRN

## 2016-05-18 MED ORDER — BETAMETHASONE DIPROPIONATE 0.05 % EX LOTN: TOPICAL_LOTION | Freq: Two times a day (BID) | CUTANEOUS | 0 refills | Status: DC

## 2016-05-18 NOTE — Patient Instructions (Signed)
Atopic Dermatitis Atopic dermatitis is a skin disorder that causes inflammation of the skin. This is the most common type of eczema. Eczema is a group of skin conditions that cause the skin to be itchy, red, and swollen. This condition is generally worse during the cooler winter months and often improves during the warm summer months. Symptoms can vary from person to person. Atopic dermatitis usually starts showing signs in infancy and can last through adulthood. This condition cannot be passed from one person to another (non-contagious), but is more common in families. Atopic dermatitis may not always be present. When it is present, it is called a flare-up. What are the causes? The exact cause of this condition is not known. Flare-ups of the condition may be triggered by:  Contact with something you are sensitive or allergic to.  Stress.  Certain foods.  Extremely hot or cold weather.  Harsh chemicals and soaps.  Dry air.  Chlorine. What increases the risk? This condition is more likely to develop in people who have a personal history or family history of eczema, allergies, asthma, or hay fever. What are the signs or symptoms? Symptoms of this condition include:  Dry, scaly skin.  Red, itchy rash.  Itchiness, which can be severe. This may occur before the skin rash. This can make sleeping difficult.  Skin thickening and cracking can occur over time. How is this diagnosed? This condition is diagnosed based on your symptoms, a medical history, and a physical exam. How is this treated? There is no cure for this condition, but symptoms can usually be controlled. Treatment focuses on:  Controlling the itching and scratching. You may be given medicines, such as antihistamines or steroid creams.  Limiting exposure to things that you are sensitive or allergic to (allergens).  Recognizing situations that cause stress and developing a plan to manage stress. If your atopic dermatitis  does not get better with medicines or is all over your body (widespread) , a treatment using a specific type of light (phototherapy) may be used. Follow these instructions at home: Skin care  Keep your skin well-moisturized. This seals in moisture and help prevent dryness.  Use unscented lotions that have petroleum in them.  Avoid lotions that contain alcohol and water. They can dry the skin.  Keep baths or showers short (less than 5 minutes) in warm water. Do not use hot water.  Use mild, unscented cleansers for bathing. Avoid soap and bubble bath.  Apply a moisturizer to your skin right after a bath or shower.   Do not apply anything to your skin without checking with your health care provider. General instructions  Dress in clothes made of cotton or cotton blends. Dress lightly because heat increases itching.  When washing your clothes, rinse your clothes twice so all of the soap is removed.  Avoid any triggers that can cause a flare-up.  Try to manage your stress.  Keep your fingernails cut short.  Avoid scratching. Scratching makes the rash and itching worse. It may also result in a skin infection (impetigo) due to a break in the skin caused by scratching.  Take or apply over-the-counter and prescription medicines only as told by your health care provider.  Keep all follow-up visits as told by your health care provider. This is important.  Do not be around people who have cold sores or fever blisters. If you get the infection, it may cause your atopic dermatitis to worsen. Contact a health care provider if:  Your itching  interferes with sleep.  Your rash gets worse or is not better within one week of starting treatment.  You have a fever.  You have a rash flare-up after having contact with someone who has cold sores or fever blisters. Get help right away if:  You develop pus or soft yellow scabs in the rash area. Summary  This condition causes a red rash and  itchy, dry, scaly skin.  Treatment focuses on controlling the itching and scratching, limiting exposure to things that you are sensitive or allergic to (allergens), and recognizing situations that cause stress and developing a plan to manage stress.  Keep your skin well-moisturized.  Keep baths or showers less than 5 minutes. This information is not intended to replace advice given to you by your health care provider. Make sure you discuss any questions you have with your health care provider. Document Released: 03/30/2000 Document Revised: 09/08/2015 Document Reviewed: 11/03/2012 Elsevier Interactive Patient Education  2017 [employer].

## 2016-05-18 NOTE — Telephone Encounter (Signed)
Patient is here being seen now

## 2016-05-18 NOTE — Progress Notes (Signed)
   Subjective:    Patient ID: [name], female    DOB: [birth year], 83 y.o.   MRN: [id number]  Pt presents to the office today  Hypertension  Pertinent negatives include no shortness of breath.  Rash  This is a recurrent problem. The current episode started more than 1 year ago. The problem has been waxing and waning since onset. The affected locations include the chest, back, groin and abdomen. The rash is characterized by itchiness. Pertinent negatives include no diarrhea, eye pain, rhinorrhea, shortness of breath or sore throat. Past treatments include cold compress, moisturizer, antihistamine and anti-itch cream. The treatment provided mild relief. Her past medical history is significant for allergies.      Review of Systems  HENT: Negative for rhinorrhea and sore throat.   Eyes: Negative for pain.  Respiratory: Negative for shortness of breath.   Gastrointestinal: Negative for diarrhea.  Skin: Positive for rash.       Objective:   Physical Exam  Constitutional: She is oriented to person, place, and time. She appears well-developed and well-nourished. No distress.  HENT:  Head: Normocephalic.  Eyes: Pupils are equal, round, and reactive to light.  Neck: Normal range of motion. Neck supple. No thyromegaly present.  Cardiovascular: Normal rate, regular rhythm, normal heart sounds and intact distal pulses.   No murmur heard. Pulmonary/Chest: Effort normal and breath sounds normal. No respiratory distress. She has no wheezes.  Abdominal: Soft. Bowel sounds are normal. She exhibits no distension. There is no tenderness.  Musculoskeletal: Normal range of motion. She exhibits no edema or tenderness.  Neurological: She is alert and oriented to person, place, and time. She has normal reflexes. No cranial nerve deficit.  Skin: Skin is warm and dry. Rash noted.  Dry darken rash on back, abdomen, and groin  Psychiatric: She has a normal mood and affect. Her behavior is normal.  Judgment and thought content normal.     BP 139/83   Pulse 97   Temp 98.3 F (36.8 C) (Oral)   Ht 5\' 6"  (1.676 m)   Wt 223 lb 12.8 oz (101.5 kg)   BMI 36.12 kg/m      Assessment & Plan:  1. Atopic dermatitis, unspecified type - Ambulatory referral to Allergy - betamethasone dipropionate 0.05 % lotion; Apply topically 2 (two) times daily.  Dispense: 120 mL; Refill: 0  2. Candidiasis of skin - fluconazole (DIFLUCAN) 150 MG tablet; Take 1 tablet (150 mg total) by mouth every three (3) days as needed.  Dispense: 4 tablet; Refill: 0  Limit hot showers Continue with aveeno lotion Referral to Allergen specialists to see if an agent is making rash worse Avoid scratching RTO Prn   [name], [name]

## 2016-05-19 ENCOUNTER — Other Ambulatory Visit: Payer: Self-pay | Admitting: Family

## 2016-05-21 ENCOUNTER — Ambulatory Visit: Payer: Medicare Other | Admitting: Family

## 2016-07-10 ENCOUNTER — Ambulatory Visit (INDEPENDENT_AMBULATORY_CARE_PROVIDER_SITE_OTHER): Payer: Medicare Other | Admitting: Adult Health

## 2016-07-10 ENCOUNTER — Encounter: Payer: Self-pay | Admitting: Adult Health

## 2016-07-10 VITALS — BP 153/82 | HR 87 | Ht 66.0 in | Wt 227.0 lb

## 2016-07-10 DIAGNOSIS — R569 Unspecified convulsions: Secondary | ICD-10-CM

## 2016-07-10 MED ORDER — LEVETIRACETAM ER 500 MG PO TB24: 1500.0000 mg | ORAL_TABLET | Freq: Every day | ORAL | 11 refills | Status: DC

## 2016-07-10 NOTE — Progress Notes (Signed)
I have read the note, and I agree with the clinical assessment and plan.  Audie Wieser KEITH   

## 2016-07-10 NOTE — Patient Instructions (Signed)
Continue Keppra.  If your symptoms worsen or you develop new symptoms please let us know.   

## 2016-07-10 NOTE — Progress Notes (Signed)
PATIENT: DECLYN OFFIELD DOB: 1923/11/01  REASON FOR VISIT: follow up- seizures HISTORY FROM: patient  HISTORY OF PRESENT ILLNESS: Ms. Korol is a 83 year old female with a history of partial complex type seizures. She returns today for follow-up. At the last visit she was switched to Keppra extended release 1500 mg daily. It was intended that the patient also stay on phenobarbital however she states that she was confused and stopped the phenobarbital and  only been on Keppra for the last year. The patient has not had any seizures. She has done well just on Keppra. She does not operate a motor vehicle. Her daughter helps her with her medications. She states her main concern today is itching and a rash that has persisted over the last year. She states that she did see her primary care and she thought they were referring her to a dermatologist. She returns today for an evaluation.  HISTORY 07/07/15: Ms. Seifried is a 82 year old right-handed black female with a history of partial complex type seizures. The patient has been on phenobarbital for number of years, and she has also been on Keppra. The patient is on 1000 mg twice daily of the Keppra, but she believes this is causing some drowsiness during the day. The patient indicates that she naps off and on throughout the day, but she has difficulty sleeping at nighttime. The patient takes phenobarbital at nighttime. She has had one seizure that occurred in early December 2016. She lives alone, her seizures are usually associated with a brief staring episode. She does not operate a motor vehicle. She wishes to cut back on her medication because of the drowsiness. She comes to this office for an evaluation.  REVIEW OF SYSTEMS: Out of a complete 14 system review of symptoms, the patient complains only of the following symptoms, and all other reviewed systems are negative.  See history of present illness  ALLERGIES: Allergies  Allergen Reactions  . Advicor  [Niacin-Lovastatin Er] Other (See Comments)    unknown  . Caduet [Amlodipine-Atorvastatin] Other (See Comments)    unknown  . Codeine Other (See Comments)    unknown  . Dilantin [Phenytoin] Other (See Comments)    unknown  . Lescol [Fluvastatin] Other (See Comments)  . Pamelor [Nortriptyline] Other (See Comments)    unknown  . Penicillins Other (See Comments)    unknown  . Sulfa Antibiotics Other (See Comments)    unknown  . Trileptal [Oxcarbazepine] Other (See Comments)    unknown  . Ultram [Tramadol] Other (See Comments)    unknown  . Welchol [Colesevelam] Other (See Comments)    unknown    HOME MEDICATIONS: Outpatient Medications Prior to Visit  Medication Sig Dispense Refill  . albuterol (PROVENTIL HFA;VENTOLIN HFA) 108 (90 BASE) MCG/ACT inhaler Inhale 2 puffs into the lungs every 6 (six) hours as needed for wheezing or shortness of breath. 1 Inhaler 6  . albuterol (PROVENTIL) (2.5 MG/3ML) 0.083% nebulizer solution Take 3 mLs (2.5 mg total) by nebulization at bedtime as needed for wheezing or shortness of breath. (Patient taking differently: Take 2.5 mg by nebulization every 6 (six) hours as needed for wheezing or shortness of breath. ) 90 mL 3  . aspirin 81 MG tablet Take 81 mg by mouth daily.    . betamethasone dipropionate 0.05 % lotion Apply topically 2 (two) times daily. 120 mL 0  . diphenhydrAMINE (BENADRYL) 25 MG tablet Take 25 mg by mouth every morning.    . ferrous sulfate 325 (65 FE)  MG tablet Take 325 mg by mouth every Monday, Wednesday, and Friday.     . fluconazole (DIFLUCAN) 150 MG tablet Take 1 tablet (150 mg total) by mouth every three (3) days as needed. 4 tablet 0  . furosemide (LASIX) 20 MG tablet TAKE 1 TABLET (20 MG TOTAL) BY MOUTH DAILY. 30 tablet 3  . hydrOXYzine (ATARAX/VISTARIL) 50 MG tablet TAKE 1 TABLET (50 MG TOTAL) BY MOUTH EVERY 8 (EIGHT) HOURS AS NEEDED FOR ITCHING. (Patient not taking: Reported on 05/18/2016) 30 tablet 2  . levETIRAcetam (KEPPRA  XR) 500 MG 24 hr tablet TAKE 3 TABLETS (1,500 MG TOTAL) BY MOUTH AT BEDTIME. 90 tablet 5  . metolazone (ZAROXOLYN) 2.5 MG tablet TAKE 1 TABLET ON MON, WED, AND FRIDAY 15 tablet 1  . nystatin-triamcinolone ointment (MYCOLOG) Apply 1 application topically 2 (two) times daily. 90 g 1  . ULORIC 80 MG TABS TAKE 1 TABLET EVERY DAY 90 tablet 1   No facility-administered medications prior to visit.     PAST MEDICAL HISTORY: Past Medical History:  Diagnosis Date  . Chronic low back pain   . Degenerative arthritis   . Eczema   . Edema   . Gout   . Headache   . Hiatal hernia   . Hypertension   . Internal hemorrhoids   . Neurogenic claudication   . Obesity   . Seizure disorder ([place])   . Seizures ([place])   . Stroke [facility])     PAST SURGICAL HISTORY: Past Surgical History:  Procedure Laterality Date  . ABDOMINAL HYSTERECTOMY    . APPENDECTOMY    . APPENDECTOMY    . bilateral knees    . cataracts    . CHOLECYSTECTOMY    . mammoplasty reduction    . NEUROPLASTY / TRANSPOSITION MEDIAN NERVE AT CARPAL TUNNEL BILATERAL    . RE-EXCISION OF BREAST LUMPECTOMY    . TUBAL LIGATION      FAMILY HISTORY: Family History  Problem Relation Age of Onset  . Diabetes Mother   . Heart disease Mother   . Heart disease Father   . Cancer Sister   . Cancer Brother   . Cancer Sister     SOCIAL HISTORY: Social History   Social History  . Marital status: Widowed    Spouse name: N/A  . Number of children: 4  . Years of education: N/A   Occupational History  . Not on file.   Social History Main Topics  . Smoking status: Never Smoker  . Smokeless tobacco: Never Used  . Alcohol use No  . Drug use: No  . Sexual activity: Not on file   Other Topics Concern  . Not on file   Social History Narrative   Patient is right handed.   Patient drinks some caffeine daily.         PHYSICAL EXAM  Vitals:   07/10/16 0923 07/10/16 1017  BP: (!) 193/87 (!) 153/82  Pulse: 87   Weight: 227 lb  (103 kg)   Height: 5\' 6"  (1.676 m)    Body mass index is 36.64 kg/m.  Generalized: Well developed, in no acute distress   Neurological examination  Mentation: Alert oriented to time, place, history taking. Follows all commands speech and language fluent Cranial nerve II-XII: Pupils were equal round reactive to light. Extraocular movements were full, visual field were full on confrontational test. Facial sensation and strength were normal. Uvula tongue midline. Head turning and shoulder shrug  were normal and symmetric. Motor: The  motor testing reveals 5 over 5 strength of all 4 extremities. Good symmetric motor tone is noted throughout.  Sensory: Sensory testing is intact to soft touch on all 4 extremities. No evidence of extinction is noted.  Coordination: Cerebellar testing reveals good finger-nose-finger and heel-to-shin bilaterally.  Gait and station: Patient uses a cane when ambulating. Tandem gait not attempted. Reflexes: Deep tendon reflexes are symmetric and normal bilaterally.   DIAGNOSTIC DATA (LABS, IMAGING, TESTING) - I reviewed patient records, labs, notes, testing and imaging myself where available.  Lab Results  Component Value Date   WBC 8.4 02/27/2016   HGB 9.1 (L) 02/27/2016   HCT 28.1 (L) 02/27/2016   MCV 73.2 (L) 02/27/2016   PLT 147 (L) 02/27/2016      Component Value Date/Time   NA 139 02/27/2016 2028   NA 139 12/26/2015 1150   K 4.0 02/27/2016 2028   CL 108 02/27/2016 2028   CO2 26 02/27/2016 2028   GLUCOSE 86 02/27/2016 2028   BUN 20 02/27/2016 2028   BUN 57 (H) 12/26/2015 1150   CREATININE 1.47 (H) 02/27/2016 2028   CALCIUM 9.5 02/27/2016 2028   PROT 7.1 12/26/2015 1150   ALBUMIN 4.2 12/26/2015 1150   AST 17 12/26/2015 1150   ALT 10 12/26/2015 1150   ALKPHOS 46 12/26/2015 1150   BILITOT 0.3 12/26/2015 1150   GFRNONAA 30 (L) 02/27/2016 2028   GFRAA 35 (L) 02/27/2016 2028   Lab Results  Component Value Date   CHOL 172 08/04/2015   HDL 38 (L)  08/04/2015   LDLCALC 83 08/04/2015   TRIG 255 (H) 08/04/2015   CHOLHDL 4.5 (H) 08/04/2015   Lab Results  Component Value Date   HGBA1C 5.8% 01/07/2013   Lab Results  Component Value Date   VITAMINB12 1,134 (H) 10/12/2015   Lab Results  Component Value Date   TSH 3.451 10/11/2015      ASSESSMENT AND PLAN 83 y.o. year old female  has a past medical history of Chronic low back pain; Degenerative arthritis; Eczema; Edema; Gout; Headache; Hiatal hernia; Hypertension; Internal hemorrhoids; Neurogenic claudication; Obesity; Seizure disorder ([place]); Seizures ([place]); and Stroke ([place]). here with:  1. Seizures  Overall the patient has done well. At the last visit she did stop taking phenobarbital and is only been taking Keppra. Fortunately she has done well on Keppra. At this time we will not restart phenobarbital. She will continue on Keppra XR 1500 mg daily. Advised that if she has any seizure events she should let us know. Also advised that she should follow-up with her primary care for skin issues. She will return to our office in one year or sooner if needed.  I spent 15 minutes with the patient 50% of this time was spent reviewing the patient's medication.     Ward Givens, MSN, NP-C 07/10/2016, 9:23 AM [facility] [address], [place], [place] [address] [phone]

## 2016-07-17 ENCOUNTER — Encounter: Payer: Self-pay | Admitting: Family

## 2016-07-17 ENCOUNTER — Ambulatory Visit (INDEPENDENT_AMBULATORY_CARE_PROVIDER_SITE_OTHER): Payer: Medicare Other | Admitting: Family

## 2016-07-17 VITALS — BP 152/87 | HR 102 | Temp 97.4°F | Ht 66.0 in | Wt 226.0 lb

## 2016-07-17 DIAGNOSIS — K5900 Constipation, unspecified: Secondary | ICD-10-CM

## 2016-07-17 DIAGNOSIS — I10 Essential (primary) hypertension: Secondary | ICD-10-CM

## 2016-07-17 DIAGNOSIS — F411 Generalized anxiety disorder: Secondary | ICD-10-CM

## 2016-07-17 DIAGNOSIS — L209 Atopic dermatitis, unspecified: Secondary | ICD-10-CM | POA: Diagnosis not present

## 2016-07-17 MED ORDER — ESCITALOPRAM OXALATE 5 MG PO TABS: 5.0000 mg | ORAL_TABLET | Freq: Every day | ORAL | 1 refills | Status: DC

## 2016-07-17 NOTE — Patient Instructions (Signed)

## 2016-07-17 NOTE — Progress Notes (Signed)
Subjective:    Patient ID: [name], female    DOB: [birth year], 83 y.o.   MRN: [id number]  Pt presents to the office today for recurrent pruritus. Pt was referred to an [facility], but states she was never contacted with an appointment.   Rash  This is a recurrent problem. The current episode started more than 1 year ago. The problem has been waxing and waning since onset. The affected locations include the chest, back, groin and abdomen. The rash is characterized by itchiness. Pertinent negatives include no diarrhea, eye pain, rhinorrhea, shortness of breath or sore throat. Past treatments include cold compress, moisturizer, antihistamine and anti-itch cream. The treatment provided mild relief. Her past medical history is significant for allergies.  Hypertension  This is a chronic problem. The current episode started more than 1 year ago. The problem has been waxing and waning since onset. The problem is uncontrolled. Associated symptoms include anxiety and peripheral edema. Pertinent negatives include no headaches, palpitations or shortness of breath. Risk factors for coronary artery disease include obesity and post-menopausal state. Past treatments include diuretics. The current treatment provides mild improvement.  Constipation  This is a chronic problem. The current episode started more than 1 year ago. Her stool frequency is 2 to 3 times per week. Pertinent negatives include no diarrhea. Risk factors include obesity. She has tried laxatives for the symptoms. The treatment provided mild relief.  Anxiety  Presents for follow-up visit. Symptoms include decreased concentration, depressed mood, excessive worry, irritability and nervous/anxious behavior. Patient reports no palpitations or shortness of breath. Symptoms occur occasionally. The severity of symptoms is moderate.        Review of Systems  Constitutional: Positive for irritability.  HENT: Negative for rhinorrhea and  sore throat.   Eyes: Negative for pain.  Respiratory: Negative for shortness of breath.   Cardiovascular: Positive for leg swelling. Negative for palpitations.  Gastrointestinal: Positive for constipation. Negative for diarrhea.  Skin: Positive for rash.  Neurological: Negative for headaches.  Psychiatric/Behavioral: Positive for decreased concentration. The patient is nervous/anxious.   All other systems reviewed and are negative.      Objective:   Physical Exam  Constitutional: She is oriented to person, place, and time. She appears well-developed and well-nourished. No distress.  Morbid obese   HENT:  Head: Normocephalic.  Eyes: Pupils are equal, round, and reactive to light.  Neck: Normal range of motion. Neck supple. No thyromegaly present.  Cardiovascular: Normal rate, regular rhythm, normal heart sounds and intact distal pulses.   No murmur heard. Pulmonary/Chest: Effort normal and breath sounds normal. No respiratory distress. She has no wheezes.  Abdominal: Soft. Bowel sounds are normal. She exhibits no distension. There is no tenderness.  Musculoskeletal: Normal range of motion. She exhibits no edema or tenderness.  Neurological: She is alert and oriented to person, place, and time.  Skin: Skin is warm and dry. Rash noted.  Dry darken rash on back, abdomen, and groin  Psychiatric: Her behavior is normal. Judgment and thought content normal. Her mood appears anxious.     BP (!) 152/87   Pulse (!) 102   Temp 97.4 F (36.3 C) (Oral)   Ht 5\' 6"  (1.676 m)   Wt 226 lb (102.5 kg)   BMI 36.48 kg/m      Assessment & Plan:  1. Atopic dermatitis, unspecified type - Ambulatory referral to Dermatology - Ambulatory referral to Allergy  2. Essential hypertension -Start lasix daily  3. Constipation, unspecified constipation  type -Start daily stool soften and mirlax as needed  4. GAD (generalized anxiety disorder) -Started on Lexapro 5 mg today Stress management  RTO  in 6 weeks - escitalopram (LEXAPRO) 5 MG tablet; Take 1 tablet (5 mg total) by mouth daily.  Dispense: 90 tablet; Refill: 1   Limit hot showers Continue with aveeno lotion Referral to [facility] specialists to see if an agent is making rash worse Avoid scratching RTO 6 weeks to recheck GAD   [name], [name]

## 2016-08-01 ENCOUNTER — Ambulatory Visit (INDEPENDENT_AMBULATORY_CARE_PROVIDER_SITE_OTHER): Payer: Medicare Other | Admitting: Allergy and Immunology

## 2016-08-01 ENCOUNTER — Encounter: Payer: Self-pay | Admitting: Allergy and Immunology

## 2016-08-01 VITALS — BP 138/76 | HR 92 | Temp 97.8°F | Resp 16 | Ht 66.0 in | Wt 284.0 lb

## 2016-08-01 DIAGNOSIS — D721 Eosinophilia, unspecified: Secondary | ICD-10-CM

## 2016-08-01 DIAGNOSIS — L989 Disorder of the skin and subcutaneous tissue, unspecified: Secondary | ICD-10-CM

## 2016-08-01 DIAGNOSIS — D649 Anemia, unspecified: Secondary | ICD-10-CM | POA: Diagnosis not present

## 2016-08-01 DIAGNOSIS — L308 Other specified dermatitis: Secondary | ICD-10-CM | POA: Diagnosis not present

## 2016-08-01 DIAGNOSIS — L500 Allergic urticaria: Secondary | ICD-10-CM

## 2016-08-01 DIAGNOSIS — R634 Abnormal weight loss: Secondary | ICD-10-CM | POA: Diagnosis not present

## 2016-08-01 MED ORDER — MONTELUKAST SODIUM 10 MG PO TABS: 10.0000 mg | ORAL_TABLET | Freq: Every day | ORAL | 5 refills | Status: DC

## 2016-08-01 MED ORDER — CETIRIZINE HCL 10 MG PO TABS: 10.0000 mg | ORAL_TABLET | Freq: Two times a day (BID) | ORAL | 5 refills | Status: DC

## 2016-08-01 MED ORDER — MOMETASONE FUROATE 0.1 % EX CREA: 1.0000 "application " | TOPICAL_CREAM | Freq: Every day | CUTANEOUS | 0 refills | Status: DC

## 2016-08-01 MED ORDER — METHYLPREDNISOLONE ACETATE 80 MG/ML IJ SUSP
80.0000 mg | Freq: Once | INTRAMUSCULAR | Status: AC
  Administered 2016-08-01: 80 mg via INTRAMUSCULAR

## 2016-08-01 MED ORDER — RANITIDINE HCL 150 MG PO TABS: 150.0000 mg | ORAL_TABLET | Freq: Two times a day (BID) | ORAL | 5 refills | Status: DC

## 2016-08-01 NOTE — Progress Notes (Signed)
Dear [name],  Thank you for referring [name] to the [facility] on 08/01/2016.   Below is a summation of this patient's evaluation and recommendations.  Thank you for your referral. I will keep you informed about this patient's response to treatment.   If you have any questions please do not hesitate to contact me.   Sincerely,  Jiles Prows, MD Allergy / Immunology [place]   ______________________________________________________________________    NEW PATIENT NOTE  Referring Provider: [name], [name] Primary Provider: [name], [name] Date of office visit: 08/01/2016    Subjective:   Chief Complaint:  [name] (DOB: Aug 27, 1923) is a 83 y.o. female who presents to the clinic on 08/01/2016 with a chief complaint of Urticaria (has "blister" like bumps that pop up at random all over. leave very dark circles when they go away. lots of itching. ) .     HPI: [name] presents to this clinic in evaluation of a itchy dermatitis. Apparently at the very beginning of 2017 she developed a rash on her back that progressed to involve all areas of her body. She has intense itching and intense scratching. She states that she develops "blisters". She did develop what sounds like a localized cutaneous infection affecting her left inner shin that left behind a scar but otherwise she has been developing hyperpigmentation across her body but no obvious scar. She's been given multiple creams and pills which do not really help her. She has no associated systemic or constitutional symptoms and no obvious provoking factor giving rise to this issue.  She apparently does have an anemia although the cause of this anemia is not entirely clear. She has apparently lost 50 pounds of weight in 2017 because she has "no appetite". She has never had a colonoscopy. She does have heartburn for  which she will use Tums one time per week.  She does have a history of a pneumonia in 2016 for which she was given albuterol nebulization. She's been using albuterol nebulization 3 times per day since that event although she has no coughing or wheezing or shortness of breath. She occasionally does get nasal congestion and some sneezing. There is no obvious provoking factor giving rise to this issue.  Past Medical History:  Diagnosis Date  . Chronic low back pain   . Degenerative arthritis   . Eczema   . Edema   . Gout   . Headache   . Hiatal hernia   . Hypertension   . Internal hemorrhoids   . Neurogenic claudication   . Obesity   . Seizure disorder ([place])   . Seizures ([place])   . Stroke ([place])   . Urticaria     Past Surgical History:  Procedure Laterality Date  . ABDOMINAL HYSTERECTOMY    . APPENDECTOMY    . APPENDECTOMY    . bilateral knees    . cataracts    . CHOLECYSTECTOMY    . mammoplasty reduction    . NEUROPLASTY / TRANSPOSITION MEDIAN NERVE AT CARPAL TUNNEL BILATERAL    . RE-EXCISION OF BREAST LUMPECTOMY    . TUBAL LIGATION      Allergies as of 08/01/2016      Reactions   Advicor [niacin-lovastatin Er] Other (See Comments)   unknown   Caduet [amlodipine-atorvastatin] Other (See Comments)   unknown   Codeine Other (See Comments)   unknown  Dilantin [phenytoin] Other (See Comments)   unknown   Lescol [fluvastatin] Other (See Comments)   Pamelor [nortriptyline] Other (See Comments)   unknown   Penicillins Other (See Comments)   unknown   Sulfa Antibiotics Other (See Comments)   unknown   Trileptal [oxcarbazepine] Other (See Comments)   unknown   Ultram [tramadol] Other (See Comments)   unknown   Welchol [colesevelam] Other (See Comments)   unknown      Medication List      albuterol 108 (90 Base) MCG/ACT inhaler Commonly known as:  PROVENTIL HFA;VENTOLIN HFA Inhale 2 puffs into the lungs every 6 (six) hours as needed for wheezing or shortness of  breath.   albuterol (2.5 MG/3ML) 0.083% nebulizer solution Commonly known as:  PROVENTIL Take 3 mLs (2.5 mg total) by nebulization at bedtime as needed for wheezing or shortness of breath.   aspirin 81 MG tablet Take 81 mg by mouth daily.   betamethasone dipropionate 0.05 % lotion Apply topically 2 (two) times daily.   diphenhydrAMINE 25 MG tablet Commonly known as:  BENADRYL Take 25 mg by mouth every morning.   escitalopram 5 MG tablet Commonly known as:  LEXAPRO Take 1 tablet (5 mg total) by mouth daily.   ferrous sulfate 325 (65 FE) MG tablet Take 325 mg by mouth every Monday, Wednesday, and Friday.   furosemide 20 MG tablet Commonly known as:  LASIX TAKE 1 TABLET (20 MG TOTAL) BY MOUTH DAILY.   hydrOXYzine 50 MG tablet Commonly known as:  ATARAX/VISTARIL TAKE 1 TABLET (50 MG TOTAL) BY MOUTH EVERY 8 (EIGHT) HOURS AS NEEDED FOR ITCHING.   levETIRAcetam 500 MG 24 hr tablet Commonly known as:  KEPPRA XR Take 3 tablets (1,500 mg total) by mouth at bedtime.   metolazone 2.5 MG tablet Commonly known as:  ZAROXOLYN TAKE 1 TABLET ON MON, WED, AND FRIDAY   nystatin-triamcinolone ointment Commonly known as:  MYCOLOG Apply 1 application topically 2 (two) times daily.   ULORIC 80 MG Tabs Generic drug:  Febuxostat TAKE 1 TABLET EVERY DAY       Review of systems negative except as noted in HPI / PMHx or noted below:  Review of Systems  Constitutional: Negative.   HENT: Negative.   Eyes: Negative.   Respiratory: Negative.   Cardiovascular: Negative.   Gastrointestinal: Negative.   Genitourinary: Negative.   Musculoskeletal: Negative.   Skin: Negative.   Neurological: Negative.   Endo/Heme/Allergies: Negative.   Psychiatric/Behavioral: Negative.     Family History  Problem Relation Age of Onset  . Diabetes Mother   . Heart disease Mother   . Heart disease Father   . Cancer Sister   . Cancer Brother   . Cancer Sister     Social History   Social History   . Marital status: Widowed    Spouse name: N/A  . Number of children: 4  . Years of education: N/A   Occupational History  . Not on file.   Social History Main Topics  . Smoking status: Never Smoker  . Smokeless tobacco: Never Used  . Alcohol use No  . Drug use: No  . Sexual activity: Not on file   Other Topics Concern  . Not on file   Social History Narrative   Patient is right handed.   Patient drinks some caffeine daily.       Environmental and Social history  Lives in a house with a dry environment, no animals located inside the household, carpeting in  the bedroom, plastic on the bed and pillow, and no smoking ongoing with inside the household.  Objective:   Vitals:   08/01/16 0918  BP: 138/76  Pulse: 92  Resp: 16  Temp: 97.8 F (36.6 C)   Height: 5\' 6"  (167.6 cm) Weight: 284 lb (128.8 kg)  Physical Exam  Constitutional: She is well-developed, well-nourished, and in no distress.  HENT:  Head: Normocephalic. Head is without right periorbital erythema and without left periorbital erythema.  Right Ear: Tympanic membrane, external ear and ear canal normal.  Left Ear: Tympanic membrane, external ear and ear canal normal.  Nose: Nose normal. No mucosal edema or rhinorrhea.  Mouth/Throat: Uvula is midline, oropharynx is clear and moist and mucous membranes are normal. No oropharyngeal exudate.  Eyes: Conjunctivae and lids are normal. Pupils are equal, round, and reactive to light.  Neck: Trachea normal. No tracheal tenderness present. No tracheal deviation present. No thyromegaly present.  Cardiovascular: Normal rate, regular rhythm, S1 normal, S2 normal and normal heart sounds.   No murmur heard. Pulmonary/Chest: Effort normal and breath sounds normal. No stridor. No tachypnea. No respiratory distress. She has no wheezes. She has no rales. She exhibits no tenderness.  Abdominal: Soft. She exhibits no distension and no mass. There is no hepatosplenomegaly. There is  no tenderness. There is no rebound and no guarding.  Musculoskeletal: She exhibits no edema or tenderness.  Lymphadenopathy:       Head (right side): No tonsillar adenopathy present.       Head (left side): No tonsillar adenopathy present.    She has no cervical adenopathy.    She has no axillary adenopathy.  Neurological: She is alert. Gait normal.  Skin: No rash (Symmetrical hyperpigmentation and slight lichenification affecting trunk and perineum and buttocks. A single 2 cm diameter hyperpigmented area affecting left medial upper shin) noted. She is not diaphoretic. No erythema. No pallor. Nails show no clubbing.  Psychiatric: Mood and affect normal.    Diagnostics: Allergy skin tests were not performed secondary to the recent administration of antihistamine..   Results of blood tests obtained 02/27/2016 identified a white blood cell count of 8.4 with a total eosinophil count of 800, hemoglobin 9.1, platelets 147.  Results of blood tests obtained 12/26/2015 identified normal hepatic function and a creatinine of 2.36.  Results of a chest x-ray obtained 01/03/2015 identified the following:  Heart is upper limits normal in size. Mediastinal contours are within normal limits. Mild hyperinflation. No confluent opacities or effusions. No acute bony abnormality.   Assessment and Plan:    1. Allergic urticaria   2. Inflammatory dermatosis   3. Anemia, mild   4. Weight loss   5. Eosinophilia     1. Blood - CBC w/diff, CMP, SED, TSH, T4, TP, IgE, Anemia profile B, ANA with reflex  2. Every day use the following:   A. Cetirizine 10mg  one tablet two times per day  B. Ranitidine 150 one tablet two times per day  C. Montelukast 10mg  one tablet one time per day  D. Shower followed by mometasone 0.1% ointment one time per day  3. ONLY use albuterol nebulization or Proventil HFA IF NEEDED  4. Depo-Medrol 80 IM delivered in clinic today  5. Return to clinic in 2 weeks or earlier if  problem  [name] has some form of immunological hyperreactivity presenting as a inflammatory dermatosis with urticarial features and eosinophilia. It is worrisome that she has had a 70 pound weight loss throughout 2017 suggesting the  possibility that there is a neoplastic process within her viscera giving rise to her immunological hyperreactivity. This would certainly explain her anemia but she certainly has other reasons to have anemia including her chronic renal disease. I'm going to order some blood tests in investigation of a systemic disease contributing to her immunological hyperreactivity and I have given her a plan to address this issue with the hope of raising her itch threshold and also decreasing the inflammation on her skin. She has been using albuterol nebulization ever since her pneumonia although she has no symptoms to suggest significant airway obstruction and I have informed her that she should only use this medication if needed. I'll see her back in this clinic in 2 weeks or earlier if there is a problem.  Jiles Prows, MD [facility]

## 2016-08-01 NOTE — Patient Instructions (Addendum)
  1. Blood - CBC w/diff, CMP, SED, TSH, T4, TP, IgE, Anemia profile B, ANA with reflex  2. Every day use the following:   A. Cetirizine 10mg  one tablet two times per day  B. Ranitidine 150 one tablet two times per day  C. Montelukast 10mg  one tablet one time per day  D. Shower followed by mometasone 0.1% ointment one time per day  3. ONLY use albuterol nebulization or Proventil HFA IF NEEDED  4. Depo-Medrol 80 IM delivered in clinic today  5. Return to clinic in 2 weeks or earlier if problem

## 2016-08-06 LAB — ANEMIA PROFILE B
Basophils Absolute: 0.1 10*3/uL (ref 0.0–0.2)
Basos: 2 %
EOS (ABSOLUTE): 0.4 10*3/uL (ref 0.0–0.4)
EOS: 5 %
FERRITIN: 319 ng/mL — AB (ref 15–150)
Folate: >20 ng/mL (ref >3.0)
HEMATOCRIT: 33.4 % — AB (ref 34.0–46.6)
HEMOGLOBIN: 10.9 g/dL — AB (ref 11.1–15.9)
IMMATURE GRANS (ABS): 0 10*3/uL (ref 0.0–0.1)
IMMATURE GRANULOCYTES: 0 %
IRON SATURATION: 23 % (ref 15–55)
Iron: 73 ug/dL (ref 27–139)
LYMPHS: 37 %
Lymphocytes Absolute: 3.1 10*3/uL (ref 0.7–3.1)
MCH: 23.6 pg — ABNORMAL LOW (ref 26.6–33.0)
MCHC: 32.6 g/dL (ref 31.5–35.7)
MCV: 72 fL — ABNORMAL LOW (ref 79–97)
MONOCYTES: 3 %
Monocytes Absolute: 0.2 10*3/uL (ref 0.1–0.9)
NEUTROS PCT: 53 %
Neutrophils Absolute: 4.4 10*3/uL (ref 1.4–7.0)
Platelets: 162 10*3/uL (ref 150–379)
RBC: 4.62 x10E6/uL (ref 3.77–5.28)
RDW: 17.8 % — ABNORMAL HIGH (ref 12.3–15.4)
RETIC CT PCT: 0.9 % (ref 0.6–2.6)
TIBC: 320 ug/dL (ref 250–450)
UIBC: 247 ug/dL (ref 118–369)
Vitamin B-12: 1367 pg/mL — ABNORMAL HIGH (ref 232–1245)
WBC: 8.3 10*3/uL (ref 3.4–10.8)

## 2016-08-06 LAB — COMPREHENSIVE METABOLIC PANEL
ALT: 12 IU/L (ref 0–32)
AST: 17 IU/L (ref 0–40)
Albumin/Globulin Ratio: 1.7 (ref 1.2–2.2)
Albumin: 4.5 g/dL (ref 3.2–4.6)
Alkaline Phosphatase: 45 IU/L (ref 39–117)
BUN/Creatinine Ratio: 32 — ABNORMAL HIGH (ref 12–28)
BUN: 43 mg/dL — AB (ref 10–36)
Bilirubin Total: 0.4 mg/dL (ref 0.0–1.2)
CALCIUM: 10.5 mg/dL — AB (ref 8.7–10.3)
CO2: 30 mmol/L — ABNORMAL HIGH (ref 18–29)
Chloride: 95 mmol/L — ABNORMAL LOW (ref 96–106)
Creatinine, Ser: 1.34 mg/dL — ABNORMAL HIGH (ref 0.57–1.00)
GFR calc non Af Amer: 35 mL/min/{1.73_m2} — ABNORMAL LOW (ref >59)
GFR, EST AFRICAN AMERICAN: 40 mL/min/{1.73_m2} — AB (ref >59)
GLUCOSE: 85 mg/dL (ref 65–99)
Globulin, Total: 2.7 g/dL (ref 1.5–4.5)
Potassium: 4.5 mmol/L (ref 3.5–5.2)
Sodium: 140 mmol/L (ref 134–144)
TOTAL PROTEIN: 7.2 g/dL (ref 6.0–8.5)

## 2016-08-06 LAB — T4, FREE: Free T4: 1.16 ng/dL (ref 0.82–1.77)

## 2016-08-06 LAB — TRYPTASE: TRYPTASE: 6.3 ug/L (ref 2.2–13.2)

## 2016-08-06 LAB — ANA W/REFLEX IF POSITIVE: Anti Nuclear Antibody(ANA): NEGATIVE

## 2016-08-06 LAB — TSH: TSH: 1.74 u[IU]/mL (ref 0.450–4.500)

## 2016-08-06 LAB — IGE: IGE (IMMUNOGLOBULIN E), SERUM: 1776 [IU]/mL — AB (ref 0–100)

## 2016-08-06 LAB — SEDIMENTATION RATE: Sed Rate: 14 mm/hr (ref 0–40)

## 2016-08-21 ENCOUNTER — Encounter: Payer: Self-pay | Admitting: Allergy and Immunology

## 2016-08-21 ENCOUNTER — Ambulatory Visit (INDEPENDENT_AMBULATORY_CARE_PROVIDER_SITE_OTHER): Payer: Medicare Other | Admitting: Allergy and Immunology

## 2016-08-21 VITALS — BP 120/70 | HR 72 | Resp 16

## 2016-08-21 DIAGNOSIS — L308 Other specified dermatitis: Secondary | ICD-10-CM | POA: Diagnosis not present

## 2016-08-21 DIAGNOSIS — J4531 Mild persistent asthma with (acute) exacerbation: Secondary | ICD-10-CM | POA: Diagnosis not present

## 2016-08-21 DIAGNOSIS — D721 Eosinophilia, unspecified: Secondary | ICD-10-CM

## 2016-08-21 DIAGNOSIS — L500 Allergic urticaria: Secondary | ICD-10-CM | POA: Diagnosis not present

## 2016-08-21 DIAGNOSIS — D649 Anemia, unspecified: Secondary | ICD-10-CM | POA: Diagnosis not present

## 2016-08-21 DIAGNOSIS — L989 Disorder of the skin and subcutaneous tissue, unspecified: Secondary | ICD-10-CM

## 2016-08-21 DIAGNOSIS — K219 Gastro-esophageal reflux disease without esophagitis: Secondary | ICD-10-CM

## 2016-08-21 MED ORDER — FLUTICASONE FUROATE 200 MCG/ACT IN AEPB
1.0000 | INHALATION_SPRAY | Freq: Every day | RESPIRATORY_TRACT | 5 refills | Status: DC
Start: 2016-08-21 — End: 2016-11-13

## 2016-08-21 MED ORDER — MOMETASONE FUROATE 0.1 % EX OINT: TOPICAL_OINTMENT | CUTANEOUS | 3 refills | Status: DC

## 2016-08-21 MED ORDER — OMEPRAZOLE 40 MG PO CPDR: 40.0000 mg | DELAYED_RELEASE_CAPSULE | Freq: Every day | ORAL | 5 refills | Status: DC

## 2016-08-21 NOTE — Patient Instructions (Addendum)
  1. Start the following:   A. Arnuity 200 - one inhalation one time per day  B. Omeprazole 40 - one tablet one time per day  2. Continue using the following every day:   A. Cetirizine 10mg  one tablet two times per day  B. Ranitidine 150 one tablet two times per day  C. Montelukast 10mg  one tablet one time per day  D. Shower followed by mometasone 0.1% ointment one time per day  3. ONLY use albuterol nebulization or Proventil HFA IF NEEDED  4. Return to clinic in 4 weeks or earlier if problem

## 2016-08-21 NOTE — Progress Notes (Signed)
Follow-up Note  Referring Provider: [name], [name] Primary Provider: [name], [name] Date of Office Visit: 08/21/2016  Subjective:   [name] (DOB: [birth year]) is a 83 y.o. female who returns to the [facility] [place] on 08/21/2016 in re-evaluation of the following:  HPI: [name] presents to this clinic in reevaluation of her pruritic dermatitis manifested as urticaria with inflammatory component, anemia, asthma, and reflux. I last saw her in this clinic during her initial evaluation of 08/01/2016.  With medical therapy established during her last visit she has had a dramatic improvement regarding her skin condition. She has minimally itching at this point in time and does not rub her skin anymore and she has noticed a dramatic improvement regarding the color and appearance of her skin. She has continued to use medical therapy prescribed.  She states that she attempted to discontinue her albuterol but has a need to use this medication for if she does not use albuterol she gets "blocked up" in her chest and she has coughing.  As well, she has regurgitation and burning up in her sternal region for which she will use Tums a few times per week.  Allergies as of 08/21/2016      Reactions   Advicor [niacin-lovastatin Er] Other (See Comments)   unknown   Caduet [amlodipine-atorvastatin] Other (See Comments)   unknown   Codeine Other (See Comments)   unknown   Dilantin [phenytoin] Other (See Comments)   unknown   Lescol [fluvastatin] Other (See Comments)   Pamelor [nortriptyline] Other (See Comments)   unknown   Penicillins Other (See Comments)   unknown   Sulfa Antibiotics Other (See Comments)   unknown   Trileptal [oxcarbazepine] Other (See Comments)   unknown   Ultram [tramadol] Other (See Comments)   unknown   Welchol [colesevelam] Other (See Comments)   unknown      Medication List      albuterol 108 (90 Base) MCG/ACT inhaler Commonly known  as:  PROVENTIL HFA;VENTOLIN HFA Inhale 2 puffs into the lungs every 6 (six) hours as needed for wheezing or shortness of breath.   albuterol (2.5 MG/3ML) 0.083% nebulizer solution Commonly known as:  PROVENTIL Take 3 mLs (2.5 mg total) by nebulization at bedtime as needed for wheezing or shortness of breath.   aspirin 81 MG tablet Take 81 mg by mouth daily.   betamethasone dipropionate 0.05 % lotion Apply topically 2 (two) times daily.   cetirizine 10 MG tablet Commonly known as:  ZYRTEC Take 1 tablet (10 mg total) by mouth 2 (two) times daily.   diphenhydrAMINE 25 MG tablet Commonly known as:  BENADRYL Take 25 mg by mouth every morning.   escitalopram 5 MG tablet Commonly known as:  LEXAPRO Take 1 tablet (5 mg total) by mouth daily.   ferrous sulfate 325 (65 FE) MG tablet Take 325 mg by mouth every Monday, Wednesday, and Friday.   furosemide 20 MG tablet Commonly known as:  LASIX TAKE 1 TABLET (20 MG TOTAL) BY MOUTH DAILY.   hydrOXYzine 50 MG tablet Commonly known as:  ATARAX/VISTARIL TAKE 1 TABLET (50 MG TOTAL) BY MOUTH EVERY 8 (EIGHT) HOURS AS NEEDED FOR ITCHING.   levETIRAcetam 500 MG 24 hr tablet Commonly known as:  KEPPRA XR Take 3 tablets (1,500 mg total) by mouth at bedtime.   metolazone 2.5 MG tablet Commonly known as:  ZAROXOLYN TAKE 1 TABLET ON MON, WED, AND FRIDAY   mometasone 0.1 % cream Commonly  known as:  ELOCON Apply 1 application topically daily.   montelukast 10 MG tablet Commonly known as:  SINGULAIR Take 1 tablet (10 mg total) by mouth at bedtime.   nystatin-triamcinolone ointment Commonly known as:  MYCOLOG Apply 1 application topically 2 (two) times daily.   ranitidine 150 MG tablet Commonly known as:  ZANTAC Take 1 tablet (150 mg total) by mouth 2 (two) times daily.   ULORIC 80 MG Tabs Generic drug:  Febuxostat TAKE 1 TABLET EVERY DAY       Past Medical History:  Diagnosis Date  . Chronic low back pain   . Degenerative  arthritis   . Eczema   . Edema   . Gout   . Headache   . Hiatal hernia   . Hypertension   . Internal hemorrhoids   . Neurogenic claudication   . Obesity   . Seizure disorder ([place])   . Seizures ([place])   . Stroke ([place])   . Urticaria     Past Surgical History:  Procedure Laterality Date  . ABDOMINAL HYSTERECTOMY    . APPENDECTOMY    . APPENDECTOMY    . bilateral knees    . cataracts    . CHOLECYSTECTOMY    . mammoplasty reduction    . NEUROPLASTY / TRANSPOSITION MEDIAN NERVE AT CARPAL TUNNEL BILATERAL    . RE-EXCISION OF BREAST LUMPECTOMY    . TUBAL LIGATION      Review of systems negative except as noted in HPI / PMHx or noted below:  Review of Systems  Constitutional: Negative.   HENT: Negative.   Eyes: Negative.   Respiratory: Negative.   Cardiovascular: Negative.   Gastrointestinal: Negative.   Genitourinary: Negative.   Musculoskeletal: Negative.   Skin: Negative.   Neurological: Negative.   Endo/Heme/Allergies: Negative.   Psychiatric/Behavioral: Negative.      Objective:   Vitals:   08/21/16 0935  BP: 120/70  Pulse: 72  Resp: 16          Physical Exam  Constitutional: She is well-developed, well-nourished, and in no distress.  HENT:  Head: Normocephalic.  Right Ear: Tympanic membrane, external ear and ear canal normal.  Left Ear: Tympanic membrane, external ear and ear canal normal.  Nose: Nose normal. No mucosal edema or rhinorrhea.  Mouth/Throat: Uvula is midline, oropharynx is clear and moist and mucous membranes are normal. No oropharyngeal exudate.  Eyes: Conjunctivae are normal.  Neck: Trachea normal. No tracheal tenderness present. No tracheal deviation present. No thyromegaly present.  Cardiovascular: Normal rate, regular rhythm, S1 normal, S2 normal and normal heart sounds.   No murmur heard. Pulmonary/Chest: Breath sounds normal. No stridor. No respiratory distress. She has no wheezes. She has no rales.  Musculoskeletal: She  exhibits no edema.  Lymphadenopathy:       Head (right side): No tonsillar adenopathy present.       Head (left side): No tonsillar adenopathy present.    She has no cervical adenopathy.  Neurological: She is alert. Gait normal.  Skin: Rash (Dramatic decrease in hyperpigmentation and lichenification and scale affecting trunk and buttocks.) noted. She is not diaphoretic. No erythema. Nails show no clubbing.  Psychiatric: Mood and affect normal.    Diagnostics: Results of blood tests obtained 08/01/2016 identified normal hepatic and renal function other than a creatinine of 1.34 which is less than her creatinine of 1.47 obtained on 02/27/2016. White blood cell count 8.3 with a normal differential and an absolute eosinophil count of 400, hemoglobin 10.9, platelet 162.  Sedimentation rate 14. Normal TSH and T4. Tryptase 6.3 g/L. Negative ANA. IgE 1776 international units/mL. Iron 73 g/DL, ferritin 319 ng/ML, B12 1367 pg/mL, folate greater than 20 ng/ML.  Spirometry was performed. She demonstrated a FEV1 of 1.57 which is 94% of predicted.  Assessment and Plan:   1. Allergic urticaria   2. Inflammatory dermatosis   3. Eosinophilia   4. Anemia, mild   5. Asthma, not well controlled, mild persistent, with acute exacerbation   6. Gastroesophageal reflux disease, esophagitis presence not specified     1. Start the following:   A. Arnuity 200 - one inhalation one time per day  B. Omeprazole 40 - one tablet one time per day  2. Continue using the following every day:   A. Cetirizine 10mg  one tablet two times per day  B. Ranitidine 150 one tablet two times per day  C. Montelukast 10mg  one tablet one time per day  D. Shower followed by mometasone 0.1% ointment one time per day  3. ONLY use albuterol nebulization or Proventil HFA IF NEEDED  4. Return to clinic in 4 weeks or earlier if problem  Lashanna appears to have a pretty good response regarding her pruritic dermatitis manifested as  urticaria with inflammatory component. We will keep her on the plan mentioned above at this point in time. I have made an attempt to address the issue with her lower airway complaints and her reflux by introducing Arnuity and omeprazole and we will see what type of response we get over the course of the next 4 weeks. She has significant immunologic dysregulation with eosinophilia and a very high IgE. At some point we will need to get her skin tested but I would like for her to be a little bit more stable before we remove her antihistamines for skin testing. I will see her back in this clinic in 4 weeks or earlier if there is a problem.  Allena Katz, MD Allergy / Immunology [place]

## 2016-08-30 ENCOUNTER — Encounter: Payer: Self-pay | Admitting: Family

## 2016-08-30 ENCOUNTER — Ambulatory Visit: Payer: Medicare Other | Admitting: Family

## 2016-08-30 ENCOUNTER — Ambulatory Visit (INDEPENDENT_AMBULATORY_CARE_PROVIDER_SITE_OTHER): Payer: Medicare Other | Admitting: Family

## 2016-08-30 VITALS — BP 118/77 | HR 78 | Temp 97.6°F | Ht 66.0 in | Wt 221.0 lb

## 2016-08-30 DIAGNOSIS — N183 Chronic kidney disease, stage 3 unspecified: Secondary | ICD-10-CM

## 2016-08-30 DIAGNOSIS — R609 Edema, unspecified: Secondary | ICD-10-CM | POA: Diagnosis not present

## 2016-08-30 DIAGNOSIS — R569 Unspecified convulsions: Secondary | ICD-10-CM | POA: Diagnosis not present

## 2016-08-30 DIAGNOSIS — L500 Allergic urticaria: Secondary | ICD-10-CM | POA: Diagnosis not present

## 2016-08-30 DIAGNOSIS — M1A0720 Idiopathic chronic gout, left ankle and foot, without tophus (tophi): Secondary | ICD-10-CM

## 2016-08-30 DIAGNOSIS — I10 Essential (primary) hypertension: Secondary | ICD-10-CM | POA: Diagnosis not present

## 2016-08-30 DIAGNOSIS — M109 Gout, unspecified: Secondary | ICD-10-CM | POA: Insufficient documentation

## 2016-08-30 DIAGNOSIS — D631 Anemia in chronic kidney disease: Secondary | ICD-10-CM | POA: Diagnosis not present

## 2016-08-30 DIAGNOSIS — F411 Generalized anxiety disorder: Secondary | ICD-10-CM

## 2016-08-30 DIAGNOSIS — N189 Chronic kidney disease, unspecified: Secondary | ICD-10-CM

## 2016-08-30 NOTE — Progress Notes (Signed)
Subjective:    Patient ID: [name], female    DOB: 03/20/1924, 83 y.o.   MRN: [id number]  PT presents to the office today to recheck rash. PT is followed by Allergy and was diagnosed with "pruritic dermatitis manifested as urticaria with inflammatory component, anemia, asthma, and reflux." Pt was started on Arnuity, omeprazole and told to continue her zyrtec, zantac, singulair, and mometasone ointment daily. PT states her rash is improving and is "itching less and my skin color is improving".   PT was also started on Lexapro 5 mg for GAD. PT states he helps sometimes, but her skin continues to burn, itch, and stings and this increases her anxiety.  Hypertension  This is a chronic problem. The current episode started more than 1 year ago. The problem has been resolved since onset. The problem is controlled. Associated symptoms include anxiety, malaise/fatigue and peripheral edema ("at times"). Pertinent negatives include no palpitations or shortness of breath. The current treatment provides moderate improvement. Hypertensive end-organ damage includes CVA.  Anxiety  Presents for follow-up visit. Symptoms include excessive worry, irritability and nervous/anxious behavior. Patient reports no palpitations or shortness of breath. Symptoms occur occasionally. The quality of sleep is good.   Her past medical history is significant for anemia.  Seizures   This is a chronic (if followed by Neurologists annually) problem. Episode onset: taking keppra daily. Progression since onset: had one about 3 months ago. Characteristics do not include bowel incontinence or bladder incontinence.  Anemia  Presents for follow-up visit. Symptoms include malaise/fatigue. There has been no bruising/bleeding easily, leg swelling or palpitations.  Edema PT currently taking 20 mg daily. Do well. Gout Pt currently taking Uloric 80 mg daily. States she can not "remember her last attack, but I have weakness in my ankles  at times". Pt states her gout started in left ankle.    Review of Systems  Constitutional: Positive for irritability and malaise/fatigue.  Respiratory: Negative for shortness of breath.   Cardiovascular: Negative for palpitations.  Gastrointestinal: Negative for bowel incontinence.  Genitourinary: Negative for bladder incontinence.  Skin: Positive for rash.  Neurological: Positive for seizures.  Hematological: Does not bruise/bleed easily.  Psychiatric/Behavioral: The patient is nervous/anxious.   All other systems reviewed and are negative.       Objective:   Physical Exam  Constitutional: She is oriented to person, place, and time. She appears well-developed and well-nourished. No distress.  HENT:  Head: Normocephalic and atraumatic.  Right Ear: External ear normal.  Left Ear: External ear normal.  Nose: Nose normal.  Mouth/Throat: Oropharynx is clear and moist.  Eyes: Pupils are equal, round, and reactive to light.  Neck: Normal range of motion. Neck supple. No thyromegaly present.  Cardiovascular: Normal rate, regular rhythm, normal heart sounds and intact distal pulses.   No murmur heard. Pulmonary/Chest: Effort normal and breath sounds normal. No respiratory distress. She has no wheezes.  Abdominal: Soft. Bowel sounds are normal. She exhibits no distension. There is no tenderness.  Musculoskeletal: Normal range of motion. She exhibits no edema or tenderness.  Neurological: She is alert and oriented to person, place, and time. She has normal reflexes. No cranial nerve deficit.  Skin: Skin is warm and dry. Rash noted.  hyperpigmentation  Present on lower back and buttocks-improved since last visit   Psychiatric: She has a normal mood and affect. Her behavior is normal. Judgment and thought content normal.  Vitals reviewed.   BP 118/77   Pulse 78   Temp  97.6 F (36.4 C) (Oral)   Ht '5\' 6"'  (1.676 m)   Wt 221 lb (100.2 kg)   BMI 35.67 kg/m       Assessment & Plan:    1. Essential hypertension, benign - CMP14+EGFR  2. Chronic kidney disease, stage III (moderate) - CMP14+EGFR  3. Morbid obesity ([place]) - CMP14+EGFR  4. Allergic urticari - CMP14+EGFR  5. Edema, unspecified typ - CMP14+EGFR  6. GAD (generalized anxiety disorder - CMP14+EGFR  7. Seizures  - CMP14+EGFR  8. Anemia associated with chronic renal failure - CMP14+EGFR - Anemia Profile B  9. Chronic gout of left ankle, unspecified cause - CMP14+EGFR - Uric acid   Continue all meds and keep all follow up appts with Specialists  Labs pending Health Maintenance reviewed Diet and exercise encouraged RTO 6 months   [name], [name]

## 2016-08-30 NOTE — Patient Instructions (Signed)

## 2016-08-31 LAB — CMP14+EGFR
A/G RATIO: 1.5 (ref 1.2–2.2)
ALBUMIN: 4.2 g/dL (ref 3.2–4.6)
ALK PHOS: 44 IU/L (ref 39–117)
ALT: 13 IU/L (ref 0–32)
AST: 20 IU/L (ref 0–40)
BILIRUBIN TOTAL: 0.5 mg/dL (ref 0.0–1.2)
BUN / CREAT RATIO: 19 (ref 12–28)
BUN: 35 mg/dL (ref 10–36)
CALCIUM: 9.9 mg/dL (ref 8.7–10.3)
CO2: 31 mmol/L — ABNORMAL HIGH (ref 18–29)
Chloride: 95 mmol/L — ABNORMAL LOW (ref 96–106)
Creatinine, Ser: 1.87 mg/dL — ABNORMAL HIGH (ref 0.57–1.00)
GFR calc Af Amer: 27 mL/min/{1.73_m2} — ABNORMAL LOW (ref >59)
GFR calc non Af Amer: 23 mL/min/{1.73_m2} — ABNORMAL LOW (ref >59)
GLOBULIN, TOTAL: 2.8 g/dL (ref 1.5–4.5)
Glucose: 82 mg/dL (ref 65–99)
POTASSIUM: 3.7 mmol/L (ref 3.5–5.2)
SODIUM: 137 mmol/L (ref 134–144)
Total Protein: 7 g/dL (ref 6.0–8.5)

## 2016-08-31 LAB — ANEMIA PROFILE B
BASOS ABS: 0.1 10*3/uL (ref 0.0–0.2)
Basos: 2 %
EOS (ABSOLUTE): 0.3 10*3/uL (ref 0.0–0.4)
EOS: 3 %
Ferritin: 395 ng/mL — ABNORMAL HIGH (ref 15–150)
HEMOGLOBIN: 10.1 g/dL — AB (ref 11.1–15.9)
Hematocrit: 32 % — ABNORMAL LOW (ref 34.0–46.6)
IMMATURE GRANS (ABS): 0 10*3/uL (ref 0.0–0.1)
IRON SATURATION: 21 % (ref 15–55)
IRON: 57 ug/dL (ref 27–139)
Immature Granulocytes: 0 %
LYMPHS ABS: 2.8 10*3/uL (ref 0.7–3.1)
LYMPHS: 32 %
MCH: 23.7 pg — AB (ref 26.6–33.0)
MCHC: 31.6 g/dL (ref 31.5–35.7)
MCV: 75 fL — ABNORMAL LOW (ref 79–97)
MONOS ABS: 0.3 10*3/uL (ref 0.1–0.9)
Monocytes: 4 %
Neutrophils Absolute: 5.2 10*3/uL (ref 1.4–7.0)
Neutrophils: 59 %
PLATELETS: 163 10*3/uL (ref 150–379)
RBC: 4.27 x10E6/uL (ref 3.77–5.28)
RDW: 18.6 % — ABNORMAL HIGH (ref 12.3–15.4)
Retic Ct Pct: 1 % (ref 0.6–2.6)
TIBC: 275 ug/dL (ref 250–450)
UIBC: 218 ug/dL (ref 118–369)
Vitamin B-12: 1654 pg/mL — ABNORMAL HIGH (ref 232–1245)
WBC: 8.8 10*3/uL (ref 3.4–10.8)

## 2016-08-31 LAB — URIC ACID: URIC ACID: 3 mg/dL (ref 2.5–7.1)

## 2016-09-03 ENCOUNTER — Other Ambulatory Visit: Payer: Self-pay | Admitting: Family

## 2016-09-03 MED ORDER — FEBUXOSTAT 40 MG PO TABS: 40.0000 mg | ORAL_TABLET | Freq: Every day | ORAL | 1 refills | Status: DC

## 2016-09-05 ENCOUNTER — Other Ambulatory Visit: Payer: Self-pay | Admitting: Family Medicine

## 2016-09-18 ENCOUNTER — Ambulatory Visit (INDEPENDENT_AMBULATORY_CARE_PROVIDER_SITE_OTHER): Payer: Medicare Other | Admitting: Allergy and Immunology

## 2016-09-18 ENCOUNTER — Encounter: Payer: Self-pay | Admitting: Allergy and Immunology

## 2016-09-18 VITALS — BP 130/72 | HR 80 | Resp 20

## 2016-09-18 DIAGNOSIS — L500 Allergic urticaria: Secondary | ICD-10-CM | POA: Diagnosis not present

## 2016-09-18 DIAGNOSIS — L308 Other specified dermatitis: Secondary | ICD-10-CM

## 2016-09-18 DIAGNOSIS — J4530 Mild persistent asthma, uncomplicated: Secondary | ICD-10-CM | POA: Diagnosis not present

## 2016-09-18 DIAGNOSIS — K219 Gastro-esophageal reflux disease without esophagitis: Secondary | ICD-10-CM | POA: Diagnosis not present

## 2016-09-18 DIAGNOSIS — L989 Disorder of the skin and subcutaneous tissue, unspecified: Secondary | ICD-10-CM

## 2016-09-18 NOTE — Patient Instructions (Addendum)
  1. Continue the following:   A. Arnuity 200 - one inhalation one time per day  B. Omeprazole 40 - one tablet one time per day  C. Cetirizine 10mg  one tablet two times per day  D. Ranitidine 150 one tablet two times per day  E. Montelukast 10mg  one tablet one time per day  F. Shower followed by mometasone 0.1% ointment one time per day  2. ONLY use albuterol nebulization or Proventil HFA IF NEEDED  3. Return to clinic in 12 weeks or earlier if problem

## 2016-09-18 NOTE — Progress Notes (Signed)
Follow-up Note  Referring Provider: [name], [name] Primary Provider: [name], [name] Date of Office Visit: 09/18/2016  Subjective:   [name] (DOB: [birth year]) is a 83 y.o. female who returns to the [facility] [place] on 09/18/2016 in re-evaluation of the following:  HPI: [name] returns to this clinic in evaluation of her pruritic dermatitis with a component of her urticaria and inflammatory component and asthma and reflux. Her last visit to this clinic was May 2018.  Her skin still appears to be doing relatively well. She is now starting the taper off her topical steroid. She has noticed that she might have a little bit more activity involving her right flank and maybe her forearm since she has stopped her topical steroid.  During her last visit in this clinic we started her on an inhaled steroid because she felt that her airway was getting "blocked up" in her chest along with coughing. She has resolved all her respiratory tract issues at this point in time. She has no need to use a short acting bronchodilator at this point.  As well, during her last visit she complained about regurgitation and burning in her sternal region for which we started her on a proton pump inhibitor in addition to an H2 receptor blocker and she has resolved this issue.  Allergies as of 09/18/2016      Reactions   Advicor [niacin-lovastatin Er] Other (See Comments)   unknown   Caduet [amlodipine-atorvastatin] Other (See Comments)   unknown   Codeine Other (See Comments)   unknown   Dilantin [phenytoin] Other (See Comments)   unknown   Lescol [fluvastatin] Other (See Comments)   Pamelor [nortriptyline] Other (See Comments)   unknown   Penicillins Other (See Comments)   unknown   Sulfa Antibiotics Other (See Comments)   unknown   Trileptal [oxcarbazepine] Other (See Comments)   unknown   Ultram [tramadol] Other (See Comments)   unknown   Welchol [colesevelam] Other (See  Comments)   unknown      Medication List      albuterol 108 (90 Base) MCG/ACT inhaler Commonly known as:  PROVENTIL HFA;VENTOLIN HFA Inhale 2 puffs into the lungs every 6 (six) hours as needed for wheezing or shortness of breath.   albuterol (2.5 MG/3ML) 0.083% nebulizer solution Commonly known as:  PROVENTIL USE 3 ML EVERY 6 HOURS AS NEEDED FOR WHEEZING OR SHORTNESS OF BREATH   aspirin 81 MG tablet Take 81 mg by mouth daily.   betamethasone dipropionate 0.05 % lotion Apply topically 2 (two) times daily.   cetirizine 10 MG tablet Commonly known as:  ZYRTEC Take 1 tablet (10 mg total) by mouth 2 (two) times daily.   diphenhydrAMINE 25 MG tablet Commonly known as:  BENADRYL Take 25 mg by mouth every morning.   escitalopram 5 MG tablet Commonly known as:  LEXAPRO Take 1 tablet (5 mg total) by mouth daily.   ULORIC 80 MG Tabs Generic drug:  Febuxostat Take 1 tablet by mouth daily.   febuxostat 40 MG tablet Commonly known as:  ULORIC Take 1 tablet (40 mg total) by mouth daily.   ferrous sulfate 325 (65 FE) MG tablet Take 325 mg by mouth every Monday, Wednesday, and Friday.   Fluticasone Furoate 200 MCG/ACT Aepb Commonly known as:  ARNUITY ELLIPTA Inhale 1 Dose into the lungs daily. Rinse, gargle, and spit after use.   furosemide 20 MG tablet Commonly known as:  LASIX TAKE 1 TABLET (  20 MG TOTAL) BY MOUTH DAILY.   hydrOXYzine 50 MG tablet Commonly known as:  ATARAX/VISTARIL TAKE 1 TABLET (50 MG TOTAL) BY MOUTH EVERY 8 (EIGHT) HOURS AS NEEDED FOR ITCHING.   levETIRAcetam 500 MG 24 hr tablet Commonly known as:  KEPPRA XR Take 3 tablets (1,500 mg total) by mouth at bedtime.   metolazone 2.5 MG tablet Commonly known as:  ZAROXOLYN TAKE 1 TABLET ON MON, WED, AND FRIDAY   metolazone 2.5 MG tablet Commonly known as:  ZAROXOLYN TAKE 1 TABLET ON MON, WED, AND FRIDAY   mometasone 0.1 % ointment Commonly known as:  ELOCON Apply to skin once daily as directed after  bathing.   montelukast 10 MG tablet Commonly known as:  SINGULAIR Take 1 tablet (10 mg total) by mouth at bedtime.   nystatin-triamcinolone ointment Commonly known as:  MYCOLOG Apply 1 application topically 2 (two) times daily.   omeprazole 40 MG capsule Commonly known as:  PRILOSEC Take 1 capsule (40 mg total) by mouth daily.   ranitidine 150 MG tablet Commonly known as:  ZANTAC Take 1 tablet (150 mg total) by mouth 2 (two) times daily.       Past Medical History:  Diagnosis Date  . Chronic low back pain   . Degenerative arthritis   . Eczema   . Edema   . Gout   . Headache   . Hiatal hernia   . Hypertension   . Internal hemorrhoids   . Neurogenic claudication   . Obesity   . Seizure disorder ([place])   . Seizures ([place])   . Stroke ([place])   . Urticaria     Past Surgical History:  Procedure Laterality Date  . ABDOMINAL HYSTERECTOMY    . APPENDECTOMY    . APPENDECTOMY    . bilateral knees    . cataracts    . CHOLECYSTECTOMY    . mammoplasty reduction    . NEUROPLASTY / TRANSPOSITION MEDIAN NERVE AT CARPAL TUNNEL BILATERAL    . RE-EXCISION OF BREAST LUMPECTOMY    . TUBAL LIGATION      Review of systems negative except as noted in HPI / PMHx or noted below:  Review of Systems  Constitutional: Negative.   HENT: Negative.   Eyes: Negative.   Respiratory: Negative.   Cardiovascular: Negative.   Gastrointestinal: Negative.   Genitourinary: Negative.   Musculoskeletal: Negative.   Skin: Negative.   Neurological: Negative.   Endo/Heme/Allergies: Negative.   Psychiatric/Behavioral: Negative.      Objective:   Vitals:   09/18/16 1020  BP: 130/72  Pulse: 80  Resp: 20          Physical Exam  Constitutional: She is well-developed, well-nourished, and in no distress.  HENT:  Head: Normocephalic.  Right Ear: Tympanic membrane, external ear and ear canal normal.  Left Ear: Tympanic membrane, external ear and ear canal normal.  Nose: Nose normal. No  mucosal edema or rhinorrhea.  Mouth/Throat: Uvula is midline, oropharynx is clear and moist and mucous membranes are normal. No oropharyngeal exudate.  Eyes: Conjunctivae are normal.  Neck: Trachea normal. No tracheal tenderness present. No tracheal deviation present. No thyromegaly present.  Cardiovascular: Normal rate, regular rhythm, S1 normal, S2 normal and normal heart sounds.   No murmur heard. Pulmonary/Chest: Breath sounds normal. No stridor. No respiratory distress. She has no wheezes. She has no rales.  Musculoskeletal: She exhibits no edema.  Lymphadenopathy:       Head (right side): No tonsillar adenopathy present.  Head (left side): No tonsillar adenopathy present.    She has no cervical adenopathy.  Neurological: She is alert. Gait normal.  Skin: Rash (multiple areas of hyperpigmentation involving flanks without any evidence of significant erythema.) noted. She is not diaphoretic. No erythema. Nails show no clubbing.  Psychiatric: Mood and affect normal.    Diagnostics:    Spirometry was performed and demonstrated an FEV1 of 1.60 at 92 % of predicted.  The patient had an Asthma Control Test with the following results: ACT Total Score: 20.    Assessment and Plan:   1. Allergic urticaria   2. Inflammatory dermatosis   3. Asthma, well controlled, mild persistent   4. Gastroesophageal reflux disease, esophagitis presence not specified     1. Continue the following:   A. Arnuity 200 - one inhalation one time per day  B. Omeprazole 40 - one tablet one time per day  C. Cetirizine 10mg  one tablet two times per day  D. Ranitidine 150 one tablet two times per day  E. Montelukast 10mg  one tablet one time per day  F. Shower followed by mometasone 0.1% ointment one time per day  2. ONLY use albuterol nebulization or Proventil HFA IF NEEDED  3. Return to clinic in 12 weeks or earlier if problem  Cameka appears to be doing relatively well. I'm going to continue to have  her use a very large collection medical therapy for the next 12 weeks to make sure that this plan is working correctly. If she has a good 12 week interval there may be an opportunity to consolidate her treatment with her next visit. She will contact me should she have significant problems as she moves forward with this plan.  Allena Katz, MD Allergy / Immunology [place]

## 2016-09-19 NOTE — Addendum Note (Signed)
Addended by: Martyn Malay on: 09/19/2016 11:45 AM   Modules accepted: Orders

## 2016-10-03 ENCOUNTER — Ambulatory Visit: Payer: Medicare Other | Admitting: Allergy and Immunology

## 2016-10-06 ENCOUNTER — Other Ambulatory Visit: Payer: Self-pay | Admitting: Family

## 2016-10-24 ENCOUNTER — Other Ambulatory Visit: Payer: Self-pay | Admitting: Family

## 2016-10-24 DIAGNOSIS — F411 Generalized anxiety disorder: Secondary | ICD-10-CM

## 2016-11-13 ENCOUNTER — Observation Stay (HOSPITAL_COMMUNITY)
Admission: EM | Admit: 2016-11-13 | Discharge: 2016-11-15 | Disposition: A | Discharge status: Home / Self Care | Payer: Medicare Other | Attending: Internal Medicine | Admitting: Internal Medicine

## 2016-11-13 ENCOUNTER — Emergency Department (HOSPITAL_COMMUNITY): Payer: Medicare Other

## 2016-11-13 ENCOUNTER — Encounter (HOSPITAL_COMMUNITY): Payer: Self-pay | Admitting: Emergency Medicine

## 2016-11-13 DIAGNOSIS — D631 Anemia in chronic kidney disease: Secondary | ICD-10-CM

## 2016-11-13 DIAGNOSIS — Z7982 Long term (current) use of aspirin: Secondary | ICD-10-CM | POA: Diagnosis not present

## 2016-11-13 DIAGNOSIS — N2581 Secondary hyperparathyroidism of renal origin: Secondary | ICD-10-CM | POA: Diagnosis not present

## 2016-11-13 DIAGNOSIS — I129 Hypertensive chronic kidney disease with stage 1 through stage 4 chronic kidney disease, or unspecified chronic kidney disease: Secondary | ICD-10-CM | POA: Insufficient documentation

## 2016-11-13 DIAGNOSIS — R112 Nausea with vomiting, unspecified: Secondary | ICD-10-CM | POA: Diagnosis not present

## 2016-11-13 DIAGNOSIS — Z87898 Personal history of other specified conditions: Secondary | ICD-10-CM | POA: Diagnosis not present

## 2016-11-13 DIAGNOSIS — N183 Chronic kidney disease, stage 3 (moderate): Secondary | ICD-10-CM | POA: Diagnosis not present

## 2016-11-13 DIAGNOSIS — Z8669 Personal history of other diseases of the nervous system and sense organs: Secondary | ICD-10-CM | POA: Diagnosis not present

## 2016-11-13 DIAGNOSIS — L500 Allergic urticaria: Secondary | ICD-10-CM

## 2016-11-13 DIAGNOSIS — I10 Essential (primary) hypertension: Secondary | ICD-10-CM | POA: Diagnosis present

## 2016-11-13 DIAGNOSIS — N184 Chronic kidney disease, stage 4 (severe): Secondary | ICD-10-CM | POA: Diagnosis present

## 2016-11-13 DIAGNOSIS — Z8673 Personal history of transient ischemic attack (TIA), and cerebral infarction without residual deficits: Secondary | ICD-10-CM

## 2016-11-13 DIAGNOSIS — R079 Chest pain, unspecified: Principal | ICD-10-CM | POA: Insufficient documentation

## 2016-11-13 DIAGNOSIS — N189 Chronic kidney disease, unspecified: Secondary | ICD-10-CM | POA: Diagnosis present

## 2016-11-13 LAB — CBC
HCT: 32.5 % — ABNORMAL LOW (ref 36.0–46.0)
HCT: 32 % — ABNORMAL LOW (ref 36.0–46.0)
HEMOGLOBIN: 10.6 g/dL — AB (ref 12.0–15.0)
Hemoglobin: 10.5 g/dL — ABNORMAL LOW (ref 12.0–15.0)
MCH: 23.8 pg — ABNORMAL LOW (ref 26.0–34.0)
MCH: 24.2 pg — ABNORMAL LOW (ref 26.0–34.0)
MCHC: 32.6 g/dL (ref 30.0–36.0)
MCHC: 32.8 g/dL (ref 30.0–36.0)
MCV: 72.9 fL — ABNORMAL LOW (ref 78.0–100.0)
MCV: 73.9 fL — AB (ref 78.0–100.0)
Platelets: 169 10*3/uL (ref 150–400)
Platelets: 142 10*3/uL — ABNORMAL LOW (ref 150–400)
RBC: 4.46 MIL/uL (ref 3.87–5.11)
RBC: 4.33 MIL/uL (ref 3.87–5.11)
RDW: 17.9 % — ABNORMAL HIGH (ref 11.5–15.5)
RDW: 18 % — ABNORMAL HIGH (ref 11.5–15.5)
WBC: 10.4 10*3/uL (ref 4.0–10.5)
WBC: 10.3 10*3/uL (ref 4.0–10.5)

## 2016-11-13 LAB — BASIC METABOLIC PANEL
ANION GAP: 12 (ref 5–15)
BUN: 43 mg/dL — ABNORMAL HIGH (ref 6–20)
CALCIUM: 10.1 mg/dL (ref 8.9–10.3)
CO2: 27 mmol/L (ref 22–32)
Chloride: 100 mmol/L — ABNORMAL LOW (ref 101–111)
Creatinine, Ser: 2.02 mg/dL — ABNORMAL HIGH (ref 0.44–1.00)
GFR calc non Af Amer: 20 mL/min — ABNORMAL LOW (ref >60)
GFR, EST AFRICAN AMERICAN: 24 mL/min — AB (ref >60)
Glucose, Bld: 137 mg/dL — ABNORMAL HIGH (ref 65–99)
POTASSIUM: 3.2 mmol/L — AB (ref 3.5–5.1)
Sodium: 139 mmol/L (ref 135–145)

## 2016-11-13 LAB — TROPONIN I

## 2016-11-13 LAB — HEPATIC FUNCTION PANEL
ALT: 12 U/L — ABNORMAL LOW (ref 14–54)
AST: 28 U/L (ref 15–41)
Albumin: 3.9 g/dL (ref 3.5–5.0)
Alkaline Phosphatase: 48 U/L (ref 38–126)
Bilirubin, Direct: <0.1 mg/dL — ABNORMAL LOW (ref 0.1–0.5)
Total Bilirubin: 0.3 mg/dL (ref 0.3–1.2)
Total Protein: 7.2 g/dL (ref 6.5–8.1)

## 2016-11-13 LAB — CREATININE, SERUM
CREATININE: 1.89 mg/dL — AB (ref 0.44–1.00)
GFR, EST AFRICAN AMERICAN: 26 mL/min — AB (ref >60)
GFR, EST NON AFRICAN AMERICAN: 22 mL/min — AB (ref >60)

## 2016-11-13 LAB — I-STAT TROPONIN, ED: TROPONIN I, POC: 0 ng/mL (ref 0.00–0.08)

## 2016-11-13 LAB — LIPASE, BLOOD: Lipase: 53 U/L — ABNORMAL HIGH (ref 11–51)

## 2016-11-13 MED ORDER — ONDANSETRON HCL 4 MG/2ML IJ SOLN: 4.0000 mg | Freq: Four times a day (QID) | INTRAMUSCULAR | Status: DC | PRN

## 2016-11-13 MED ORDER — POLYETHYLENE GLYCOL 3350 17 G PO PACK
17.0000 g | PACK | Freq: Every day | ORAL | Status: DC
  Administered 2016-11-13: 17 g via ORAL
  Filled 2016-11-13 (×3): qty 1

## 2016-11-13 MED ORDER — ASPIRIN 81 MG PO TABS: 81.0000 mg | ORAL_TABLET | Freq: Every day | ORAL | Status: DC

## 2016-11-13 MED ORDER — BETAMETHASONE DIPROPIONATE 0.05 % EX LOTN: TOPICAL_LOTION | Freq: Two times a day (BID) | CUTANEOUS | Status: DC

## 2016-11-13 MED ORDER — ASPIRIN 81 MG PO CHEW
324.0000 mg | CHEWABLE_TABLET | Freq: Once | ORAL | Status: AC
  Administered 2016-11-13: 324 mg via ORAL
  Filled 2016-11-13: qty 4

## 2016-11-13 MED ORDER — POTASSIUM CHLORIDE CRYS ER 20 MEQ PO TBCR
40.0000 meq | EXTENDED_RELEASE_TABLET | Freq: Once | ORAL | Status: AC
  Administered 2016-11-13: 40 meq via ORAL
  Filled 2016-11-13: qty 2

## 2016-11-13 MED ORDER — SENNOSIDES-DOCUSATE SODIUM 8.6-50 MG PO TABS: 1.0000 | ORAL_TABLET | Freq: Every evening | ORAL | Status: DC | PRN

## 2016-11-13 MED ORDER — MONTELUKAST SODIUM 10 MG PO TABS
10.0000 mg | ORAL_TABLET | Freq: Every day | ORAL | Status: DC
  Administered 2016-11-13 – 2016-11-14 (×2): 10 mg via ORAL
  Filled 2016-11-13 (×2): qty 1

## 2016-11-13 MED ORDER — SODIUM CHLORIDE 0.9 % IV SOLN: INTRAVENOUS | Status: AC

## 2016-11-13 MED ORDER — FLUTICASONE FUROATE-VILANTEROL 100-25 MCG/INH IN AEPB
1.0000 | INHALATION_SPRAY | Freq: Every day | RESPIRATORY_TRACT | Status: DC
  Administered 2016-11-14 – 2016-11-15 (×2): 1 via RESPIRATORY_TRACT
  Filled 2016-11-13: qty 28

## 2016-11-13 MED ORDER — MORPHINE SULFATE (PF) 2 MG/ML IV SOLN: 2.0000 mg | INTRAVENOUS | Status: DC | PRN

## 2016-11-13 MED ORDER — FAMOTIDINE 20 MG PO TABS
20.0000 mg | ORAL_TABLET | Freq: Two times a day (BID) | ORAL | Status: DC
Start: 2016-11-13 — End: 2016-11-14
  Administered 2016-11-13 – 2016-11-14 (×2): 20 mg via ORAL
  Filled 2016-11-13 (×2): qty 1

## 2016-11-13 MED ORDER — LORATADINE 10 MG PO TABS
10.0000 mg | ORAL_TABLET | Freq: Every day | ORAL | Status: DC
  Administered 2016-11-14 – 2016-11-15 (×2): 10 mg via ORAL
  Filled 2016-11-13 (×2): qty 1

## 2016-11-13 MED ORDER — ACETAMINOPHEN 325 MG PO TABS
650.0000 mg | ORAL_TABLET | ORAL | Status: DC | PRN
  Administered 2016-11-14: 650 mg via ORAL
  Filled 2016-11-13: qty 2

## 2016-11-13 MED ORDER — ASPIRIN EC 81 MG PO TBEC
81.0000 mg | DELAYED_RELEASE_TABLET | Freq: Every day | ORAL | Status: DC
  Administered 2016-11-14 – 2016-11-15 (×2): 81 mg via ORAL
  Filled 2016-11-13 (×2): qty 1

## 2016-11-13 MED ORDER — ESCITALOPRAM OXALATE 10 MG PO TABS
5.0000 mg | ORAL_TABLET | Freq: Every day | ORAL | Status: DC
  Administered 2016-11-14 – 2016-11-15 (×2): 5 mg via ORAL
  Filled 2016-11-13 (×2): qty 1

## 2016-11-13 MED ORDER — ENOXAPARIN SODIUM 30 MG/0.3ML ~~LOC~~ SOLN
30.0000 mg | SUBCUTANEOUS | Status: DC
  Administered 2016-11-13 – 2016-11-14 (×2): 30 mg via SUBCUTANEOUS
  Filled 2016-11-13 (×2): qty 0.3

## 2016-11-13 MED ORDER — FLUTICASONE FUROATE 200 MCG/ACT IN AEPB: 1.0000 | INHALATION_SPRAY | Freq: Every day | RESPIRATORY_TRACT | Status: DC

## 2016-11-13 MED ORDER — FEBUXOSTAT 80 MG PO TABS: 1.0000 | ORAL_TABLET | Freq: Every day | ORAL | Status: DC

## 2016-11-13 MED ORDER — ONDANSETRON HCL 4 MG/2ML IJ SOLN
4.0000 mg | Freq: Once | INTRAMUSCULAR | Status: AC
  Administered 2016-11-13: 4 mg via INTRAVENOUS
  Filled 2016-11-13: qty 2

## 2016-11-13 MED ORDER — FEBUXOSTAT 40 MG PO TABS
80.0000 mg | ORAL_TABLET | Freq: Every day | ORAL | Status: DC
Start: 2016-11-14 — End: 2016-11-15
  Administered 2016-11-14 – 2016-11-15 (×2): 80 mg via ORAL
  Filled 2016-11-13 (×2): qty 2

## 2016-11-13 MED ORDER — LEVETIRACETAM ER 500 MG PO TB24
1500.0000 mg | ORAL_TABLET | Freq: Every day | ORAL | Status: DC
  Administered 2016-11-13 – 2016-11-14 (×2): 1500 mg via ORAL
  Filled 2016-11-13 (×2): qty 3

## 2016-11-13 MED ORDER — POTASSIUM CHLORIDE CRYS ER 20 MEQ PO TBCR: 40.0000 meq | EXTENDED_RELEASE_TABLET | Freq: Once | ORAL | Status: DC

## 2016-11-13 MED ORDER — PANTOPRAZOLE SODIUM 40 MG PO TBEC
40.0000 mg | DELAYED_RELEASE_TABLET | Freq: Every day | ORAL | Status: DC
  Administered 2016-11-14 – 2016-11-15 (×2): 40 mg via ORAL
  Filled 2016-11-13 (×2): qty 1

## 2016-11-13 MED ORDER — FERROUS SULFATE 325 (65 FE) MG PO TABS
325.0000 mg | ORAL_TABLET | ORAL | Status: DC
  Administered 2016-11-14: 325 mg via ORAL
  Filled 2016-11-13: qty 1

## 2016-11-13 NOTE — H&P (Signed)
History and Physical:    [name]   GQQ:[id number] DOB: December 31, 1923 DOA: 11/13/2016  Referring MD/provider: Forde Dandy, MD PCP: [name], [name]   Patient coming from:   Chief Complaint: Diaphoresis, left chest pain, nausea and vomiting  History of Present Illness:   [name] is an 83 y.o. female Is a 83 year old female with a PMH of stage III chronic kidney disease, stroke, seizure disorder, hypertension who presented to the hospital via EMS after she developed acute onset of nausea, vomiting, diaphoresis followed by chest discomfort while at her nephrologist's office earlier today. Patient vomited several times which made her feel a bit better. She describes the chest pain as a tightness.  ED Course:  EKG negative for ischemic changes, chest x-ray unremarkable, initial troponin negative.  ROS:   Review of Systems  Constitutional: Positive for weight loss.       277 pounds last year, down to 224.56 pounds at her physician's office today.  HENT: Negative.   Eyes: Negative.   Respiratory: Positive for shortness of breath. Negative for cough.   Cardiovascular: Positive for chest pain. Negative for palpitations and leg swelling.  Gastrointestinal: Positive for constipation, nausea and vomiting. Negative for abdominal pain, blood in stool, diarrhea and melena.  Genitourinary: Negative.   Musculoskeletal: Positive for back pain.  Skin: Positive for rash.  Neurological: Positive for dizziness, seizures and weakness.       History of seizures, none recently  Endo/Heme/Allergies: Positive for environmental allergies.  Psychiatric/Behavioral: Negative.     Past Medical History:   Past Medical History:  Diagnosis Date  . Chronic low back pain   . Degenerative arthritis   . Eczema   . Edema   . Gout   . Headache   . Hiatal hernia   . Hypertension   . Internal hemorrhoids   . Neurogenic claudication   . Obesity   . Seizure disorder ([place])   . Seizures  ([place])   . Stroke ([place])   . Urticaria     Past Surgical History:   Past Surgical History:  Procedure Laterality Date  . ABDOMINAL HYSTERECTOMY    . APPENDECTOMY    . APPENDECTOMY    . bilateral knees    . cataracts    . CHOLECYSTECTOMY    . mammoplasty reduction    . NEUROPLASTY / TRANSPOSITION MEDIAN NERVE AT CARPAL TUNNEL BILATERAL    . RE-EXCISION OF BREAST LUMPECTOMY    . TUBAL LIGATION      Social History:   Social History   Social History  . Marital status: Widowed    Spouse name: N/A  . Number of children: 4  . Years of education: N/A   Occupational History  . Not on file.   Social History Main Topics  . Smoking status: Never Smoker  . Smokeless tobacco: Never Used  . Alcohol use No  . Drug use: No  . Sexual activity: Not on file   Other Topics Concern  . Not on file   Social History Narrative   Patient is right handed.   Patient drinks some caffeine daily.       Allergies   Advicor [niacin-lovastatin er]; Caduet [amlodipine-atorvastatin]; Codeine; Dilantin [phenytoin]; Lescol [fluvastatin]; Pamelor [nortriptyline]; Penicillins; Sulfa antibiotics; Trileptal [oxcarbazepine]; Ultram [tramadol]; and Welchol [colesevelam]  Family history:   Family History  Problem Relation Age of Onset  . Diabetes Mother   . Heart disease Mother   . Heart disease Father   .  Cancer Sister   . Cancer Brother   . Cancer Sister     Current Medications:   Prior to Admission medications   Medication Sig Start Date End Date Taking? Authorizing Provider  aspirin 81 MG tablet Take 81 mg by mouth daily.   Yes [provider]  betamethasone dipropionate 0.05 % lotion Apply topically 2 (two) times daily. 05/18/16  Yes Hawks, Christy A, [name]  albuterol (PROVENTIL HFA;VENTOLIN HFA) 108 (90 BASE) MCG/ACT inhaler Inhale 2 puffs into the lungs every 6 (six) hours as needed for wheezing or shortness of breath. Patient not taking: Reported on 11/13/2016 01/03/15   Chipper Herb, MD  cetirizine (ZYRTEC) 10 MG tablet Take 1 tablet (10 mg total) by mouth 2 (two) times daily. 08/01/16   Kozlow, Donnamarie Poag, MD  diphenhydrAMINE (BENADRYL) 25 MG tablet Take 25 mg by mouth every morning.    [provider]  escitalopram (LEXAPRO) 5 MG tablet TAKE 1 TABLET (5 MG TOTAL) BY MOUTH DAILY. 10/25/16   [name], [name]  febuxostat (ULORIC) 40 MG tablet Take 1 tablet (40 mg total) by mouth daily. 09/03/16   [name], [name]  ferrous sulfate 325 (65 FE) MG tablet Take 325 mg by mouth every Monday, Wednesday, and Friday.     [provider]  Fluticasone Furoate (ARNUITY ELLIPTA) 200 MCG/ACT AEPB Inhale 1 Dose into the lungs daily. Rinse, gargle, and spit after use. 08/21/16   Kozlow, Donnamarie Poag, MD  furosemide (LASIX) 20 MG tablet TAKE 1 TABLET (20 MG TOTAL) BY MOUTH DAILY. Patient not taking: Reported on 11/13/2016 02/20/16   [name] A, [name]  furosemide (LASIX) 20 MG tablet TAKE 1 TABLET (20 MG TOTAL) BY MOUTH DAILY. 10/08/16   [name] A, [name]  hydrOXYzine (ATARAX/VISTARIL) 50 MG tablet TAKE 1 TABLET (50 MG TOTAL) BY MOUTH EVERY 8 (EIGHT) HOURS AS NEEDED FOR ITCHING. 09/30/15   [name] A, [name]  levETIRAcetam (KEPPRA XR) 500 MG 24 hr tablet Take 3 tablets (1,500 mg total) by mouth at bedtime. 07/10/16   Ward Givens, NP  metolazone (ZAROXOLYN) 2.5 MG tablet TAKE 1 TABLET ON MON, WED, AND FRIDAY 03/12/16   Hawks, Alyse Low A, [name]  metolazone (ZAROXOLYN) 2.5 MG tablet TAKE 1 TABLET ON MON, WED, AND FRIDAY Patient not taking: Reported on 11/13/2016 09/05/16   [name] A, [name]  mometasone (ELOCON) 0.1 % ointment Apply to skin once daily as directed after bathing. 08/21/16   Kozlow, Donnamarie Poag, MD  montelukast (SINGULAIR) 10 MG tablet Take 1 tablet (10 mg total) by mouth at bedtime. 08/01/16   Kozlow, Donnamarie Poag, MD  nystatin-triamcinolone ointment [facility]) Apply 1 application topically 2 (two) times daily. 11/16/15   [name], [name]  omeprazole (PRILOSEC) 40  MG capsule Take 1 capsule (40 mg total) by mouth daily. 08/21/16   Kozlow, Donnamarie Poag, MD  ranitidine (ZANTAC) 150 MG tablet Take 1 tablet (150 mg total) by mouth 2 (two) times daily. 08/01/16   Kozlow, Donnamarie Poag, MD  ULORIC 80 MG TABS Take 1 tablet by mouth daily. 08/20/16   [provider]    Physical Exam:   Vitals:   11/13/16 1500 11/13/16 1515 11/13/16 1530 11/13/16 1545  BP: 124/64 124/70 (!) 87/64 132/71  Pulse: 66 71 (!) 59 60  Resp: 12 18 12  (!) 22  Temp:      TempSrc:      SpO2: 100% 100% 100% 98%  Weight:      Height:  Physical Exam: Blood pressure 132/71, pulse 60, temperature 97.7 F (36.5 C), temperature source Oral, resp. rate (!) 22, height 5\' 6"  (1.676 m), weight 99.8 kg (220 lb), SpO2 98 %. Gen: No acute distress. Head: Normocephalic, atraumatic. Eyes: Pupils equal, round and reactive to light. Extraocular movements intact.  Sclerae nonicteric. No lid lag. Mouth: Oropharynx reveals moist mucous membranes. Edentulous with dentures to the upper and lower palate. Neck: Supple, no thyromegaly, no lymphadenopathy, no jugular venous distention. Chest: Lungs are clear to auscultation with good air movement. No rales, rhonchi or wheezes.  CV: Heart sounds are slightly irregular with an S1, S2. No murmurs, rubs, clicks, or gallops.  Abdomen: Soft, nontender, nondistended with normal active bowel sounds. No hepatosplenomegaly or palpable masses. Extremities: Extremities are without clubbing, or cyanosis. No edema. Pedal pulses 2+.  Skin: Warm and dry. No rashes, lesions or wounds. Neuro: Alert and oriented times 3; palate rises symmetrically, tongue midline, no focal neurological deficits, moves all extremities 4 equally.  Psych: Insight is good and judgment is appropriate. Mood and affect normal.   Data Review:    Labs: Basic Metabolic Panel:  Recent Labs Lab 11/13/16 1439  NA 139  K 3.2*  CL 100*  CO2 27  GLUCOSE 137*  BUN 43*  CREATININE 2.02*    CALCIUM 10.1   Liver Function Tests:  Recent Labs Lab 11/13/16 1453  AST 28  ALT 12*  ALKPHOS 48  BILITOT 0.3  PROT 7.2  ALBUMIN 3.9    Recent Labs Lab 11/13/16 1453  LIPASE 53*   No results for input(s): AMMONIA in the last 168 hours. CBC:  Recent Labs Lab 11/13/16 1439  WBC 10.4  HGB 10.6*  HCT 32.5*  MCV 72.9*  PLT 169   Cardiac Enzymes: No results for input(s): CKTOTAL, CKMB, CKMBINDEX, TROPONINI in the last 168 hours.  BNP (last 3 results) No results for input(s): PROBNP in the last 8760 hours. CBG: No results for input(s): GLUCAP in the last 168 hours.  Urinalysis    Component Value Date/Time   COLORURINE YELLOW 10/11/2015 1805   APPEARANCEUR CLEAR 10/11/2015 1805   LABSPEC 1.010 10/11/2015 1805   PHURINE 5.5 10/11/2015 1805   GLUCOSEU NEGATIVE 10/11/2015 1805   HGBUR NEGATIVE 10/11/2015 1805   BILIRUBINUR NEGATIVE 10/11/2015 1805   BILIRUBINUR neg 03/09/2015 1652   KETONESUR NEGATIVE 10/11/2015 1805   PROTEINUR NEGATIVE 10/11/2015 1805   UROBILINOGEN negative 03/09/2015 1652   NITRITE NEGATIVE 10/11/2015 1805   LEUKOCYTESUR NEGATIVE 10/11/2015 1805      Radiographic Studies: Dg Chest 2 View 11/13/2016: Personally reviewed and is as pictured below. No infiltrates. Heart not appreciably enlarged.    EKG: Independently reviewed. Sinus rhythm at 80 bpm with multiple PVCs. Left axis deviation. QTC 475 ms.   Assessment/Plan:   Principal Problem:   Chest pain associated with nausea, vomiting and diaphoresis Observe on telemetry overnight. Cycle cardiac markers. Continue aspirin. Use morphine as needed for pain control. Use Zofran as needed for nausea.  Active Problems:   Essential hypertension, benign Blood pressure is low/soft. Hold all antihypertensives for now.    Stage III chronic kidney disease/Secondary hyperparathyroidism, renal (HCC) Creatinine elevated over usual baseline values. Gently hydrate. Monitor.    Anemia associated  with chronic renal failure Hemoglobin 10.6, reasonable.    History of Morbid obesity (HCC)/abnormal weight loss Patient has lost approximately 50 pounds. May need a outpatient malignancy evaluation. BMI is currently 35.51 kg/m.    Allergic urticaria Continue antihistamines.  History of seizures Continue Keppra.    History of stroke Continue risk factor modification.    Hypokalemia Give 40 mEq of oral potassium now.   Other information:   DVT prophylaxis: Lovenox ordered. Code Status: Full code. Family Communication: No family at bedside, daughter is identified as next of kin.  Disposition Plan: Home 11/14/16. Consults called: None. Admission status: Observation.  The medical decision making on this patient was of high complexity and the patient is at high risk for clinical deterioration, therefore this is a level 3 visit.   RAMA,CHRISTINA [facility] Pager [phone] Cell: [phone]   If 7PM-7AM, please contact night-coverage [email or web address] Password [facility] 11/13/2016, 5:53 PM

## 2016-11-13 NOTE — Progress Notes (Signed)
Fully oriented patient received from ER. Pt was experiencing N/V and chest pain at Dr. Gabriel Carina - went to ER- CXR , troponin level, 12 leads EKG are normal and patient was transfered to telemetry for observation. Placed on cardiac monitor- get oriented to the unit- care plan and orders reviewed with patient- pt verbalized understanding.

## 2016-11-13 NOTE — ED Notes (Signed)
EDP aware of patient's blood pressure

## 2016-11-13 NOTE — ED Notes (Signed)
Pt denies any chest pain at this time 

## 2016-11-13 NOTE — ED Provider Notes (Signed)
[place] DEPT Provider Note   CSN: [id number] Arrival date & time: 11/13/16  1428     History   Chief Complaint Chief Complaint  Patient presents with  . Chest Pain    HPI [name] is a 83 y.o. female.  The history is provided by the patient.  Chest Pain   This is a new problem. The current episode started 1 to 2 hours ago. The problem occurs rarely. The problem has been resolved. The pain is associated with rest. The pain is present in the lateral region. The pain is mild. The quality of the pain is described as brief. The pain does not radiate. Associated symptoms include diaphoresis, nausea, near-syncope, vomiting and weakness. Pertinent negatives include no back pain, no cough, no fever, no leg pain, no lower extremity edema, no shortness of breath and no syncope. She has tried nothing for the symptoms. The treatment provided no relief.   83 year old female who presents with chest pain. She has a history of seizure disorder, TIA, and hypertension. States that she has been in her usual state of health. Was in the waiting area to see her nephrologist today when she suddenly be felt generalized weakness, unwell, with nausea, vomiting and diaphoresis. It was associated with mild discomfort over the left side of her chest. It was nonradiating. Denies any abdominal pain or diarrhea. No recent fevers. No similar symptoms in the past. Denies any vision or speech changes, focal numbness or weakness. Past Medical History:  Diagnosis Date  . Chronic low back pain   . Degenerative arthritis   . Eczema   . Edema   . Gout   . Headache   . Hiatal hernia   . Hypertension   . Internal hemorrhoids   . Neurogenic claudication   . Obesity   . Seizure disorder ([place])   . Seizures ([place])   . Stroke ([place])   . Urticaria     Patient Active Problem List   Diagnosis Date Noted  . Allergic urticaria 08/30/2016  . GAD (generalized anxiety disorder) 08/30/2016  . Gout 08/30/2016  .  Acute renal failure superimposed on stage 3 chronic kidney disease ([place]) 10/11/2015  . Stroke ([place])   . Neurogenic claudication   . Morbid obesity ([place])   . Edema   . Fluid retention 09/30/2014  . Elevated serum creatinine 09/30/2014  . Essential hypertension, benign 09/30/2014  . Secondary hyperparathyroidism, renal ([place]) 09/30/2014  . Proteinuria 09/30/2014  . Anemia associated with chronic renal failure 09/30/2014  . Chronic kidney disease, stage III (moderate) 09/30/2014  . Hiatal hernia 08/21/2012  . Seizures ([place]) 08/21/2012    Past Surgical History:  Procedure Laterality Date  . ABDOMINAL HYSTERECTOMY    . APPENDECTOMY    . APPENDECTOMY    . bilateral knees    . cataracts    . CHOLECYSTECTOMY    . mammoplasty reduction    . NEUROPLASTY / TRANSPOSITION MEDIAN NERVE AT CARPAL TUNNEL BILATERAL    . RE-EXCISION OF BREAST LUMPECTOMY    . TUBAL LIGATION      OB History    Gravida Para Term Preterm AB Living   6 4 4   2      SAB TAB Ectopic Multiple Live Births   2               Home Medications    Prior to Admission medications   Medication Sig Start Date End Date Taking? Authorizing Provider  albuterol (PROVENTIL HFA;VENTOLIN HFA) 108 (90  BASE) MCG/ACT inhaler Inhale 2 puffs into the lungs every 6 (six) hours as needed for wheezing or shortness of breath. Patient not taking: Reported on 11/13/2016 01/03/15   Chipper Herb, MD  albuterol (PROVENTIL) (2.5 MG/3ML) 0.083% nebulizer solution USE 3 ML EVERY 6 HOURS AS NEEDED FOR WHEEZING OR SHORTNESS OF BREATH 08/20/16   [provider]  aspirin 81 MG tablet Take 81 mg by mouth daily.    [provider]  betamethasone dipropionate 0.05 % lotion Apply topically 2 (two) times daily. 05/18/16   [name], [name]  cetirizine (ZYRTEC) 10 MG tablet Take 1 tablet (10 mg total) by mouth 2 (two) times daily. 08/01/16   Kozlow, Donnamarie Poag, MD  diphenhydrAMINE (BENADRYL) 25 MG tablet Take 25 mg by mouth every morning.     [provider]  escitalopram (LEXAPRO) 5 MG tablet TAKE 1 TABLET (5 MG TOTAL) BY MOUTH DAILY. 10/25/16   [name], [name]  febuxostat (ULORIC) 40 MG tablet Take 1 tablet (40 mg total) by mouth daily. 09/03/16   [name], [name]  ferrous sulfate 325 (65 FE) MG tablet Take 325 mg by mouth every Monday, Wednesday, and Friday.     [provider]  Fluticasone Furoate (ARNUITY ELLIPTA) 200 MCG/ACT AEPB Inhale 1 Dose into the lungs daily. Rinse, gargle, and spit after use. 08/21/16   Kozlow, Donnamarie Poag, MD  furosemide (LASIX) 20 MG tablet TAKE 1 TABLET (20 MG TOTAL) BY MOUTH DAILY. 02/20/16   [name] A, [name]  furosemide (LASIX) 20 MG tablet TAKE 1 TABLET (20 MG TOTAL) BY MOUTH DAILY. 10/08/16   [name] A, [name]  hydrOXYzine (ATARAX/VISTARIL) 50 MG tablet TAKE 1 TABLET (50 MG TOTAL) BY MOUTH EVERY 8 (EIGHT) HOURS AS NEEDED FOR ITCHING. 09/30/15   [name] A, [name]  levETIRAcetam (KEPPRA XR) 500 MG 24 hr tablet Take 3 tablets (1,500 mg total) by mouth at bedtime. 07/10/16   Ward Givens, NP  metolazone (ZAROXOLYN) 2.5 MG tablet TAKE 1 TABLET ON MON, WED, AND FRIDAY 03/12/16   Hawks, Alyse Low A, [name]  metolazone (ZAROXOLYN) 2.5 MG tablet TAKE 1 TABLET ON MON, WED, AND FRIDAY 09/05/16   Hawks, Christy A, [name]  mometasone (ELOCON) 0.1 % ointment Apply to skin once daily as directed after bathing. 08/21/16   Kozlow, Donnamarie Poag, MD  montelukast (SINGULAIR) 10 MG tablet Take 1 tablet (10 mg total) by mouth at bedtime. 08/01/16   Kozlow, Donnamarie Poag, MD  nystatin-triamcinolone ointment [facility]) Apply 1 application topically 2 (two) times daily. 11/16/15   [name], [name]  omeprazole (PRILOSEC) 40 MG capsule Take 1 capsule (40 mg total) by mouth daily. 08/21/16   Kozlow, Donnamarie Poag, MD  ranitidine (ZANTAC) 150 MG tablet Take 1 tablet (150 mg total) by mouth 2 (two) times daily. 08/01/16   Kozlow, Donnamarie Poag, MD  ULORIC 80 MG TABS Take 1 tablet by mouth daily. 08/20/16   [provider]     Family History Family History  Problem Relation Age of Onset  . Diabetes Mother   . Heart disease Mother   . Heart disease Father   . Cancer Sister   . Cancer Brother   . Cancer Sister     Social History Social History  Substance Use Topics  . Smoking status: Never Smoker  . Smokeless tobacco: Never Used  . Alcohol use No     Allergies   Advicor [niacin-lovastatin er]; Caduet [amlodipine-atorvastatin]; Codeine; Dilantin [phenytoin]; Lescol [fluvastatin]; Pamelor [  nortriptyline]; Penicillins; Sulfa antibiotics; Trileptal [oxcarbazepine]; Ultram [tramadol]; and Welchol [colesevelam]   Review of Systems Review of Systems  Constitutional: Positive for diaphoresis. Negative for fever.  Respiratory: Negative for cough and shortness of breath.   Cardiovascular: Positive for chest pain and near-syncope. Negative for syncope.  Gastrointestinal: Positive for nausea and vomiting.  Musculoskeletal: Negative for back pain.  Neurological: Positive for weakness.  All other systems reviewed and are negative.    Physical Exam Updated Vital Signs BP 132/71   Pulse 60   Temp 97.7 F (36.5 C) (Oral)   Resp (!) 22   Ht 5\' 6"  (1.676 m)   Wt 99.8 kg (220 lb)   SpO2 98%   BMI 35.51 kg/m   Physical Exam Physical Exam  Nursing note and vitals reviewed. Constitutional: Well developed, well nourished, non-toxic, and in no acute distress Head: Normocephalic and atraumatic.  Mouth/Throat: Oropharynx is clear and moist.  Neck: Normal range of motion. Neck supple.  Cardiovascular: Normal rate and regular rhythm.   Pulmonary/Chest: Effort normal and breath sounds normal.  Abdominal: Soft. There is no tenderness. There is no rebound and no guarding.  Musculoskeletal: Normal range of motion.  Neurological: Alert, PERRL, EOMI,  fluent speech, moves all extremities symmetrically, full strength in bilateral hand grip, no pronator drift, full strength bilateral ankle dorsi/plantarflexion.   Skin: Skin is warm and dry.  Psychiatric: Cooperative   ED Treatments / Results  Labs (all labs ordered are listed, but only abnormal results are displayed) Labs Reviewed  BASIC METABOLIC PANEL - Abnormal; Notable for the following:       Result Value   Potassium 3.2 (*)    Chloride 100 (*)    Glucose, Bld 137 (*)    BUN 43 (*)    Creatinine, Ser 2.02 (*)    GFR calc non Af Amer 20 (*)    GFR calc Af Amer 24 (*)    All other components within normal limits  CBC - Abnormal; Notable for the following:    Hemoglobin 10.6 (*)    HCT 32.5 (*)    MCV 72.9 (*)    MCH 23.8 (*)    RDW 17.9 (*)    All other components within normal limits  HEPATIC FUNCTION PANEL - Abnormal; Notable for the following:    ALT 12 (*)    Bilirubin, Direct <0.1 (*)    All other components within normal limits  LIPASE, BLOOD - Abnormal; Notable for the following:    Lipase 53 (*)    All other components within normal limits  I-STAT TROPONIN, ED    EKG  EKG Interpretation  Date/Time:  Tuesday November 13 2016 14:35:52 EDT Ventricular Rate:  80 PR Interval:    QRS Duration: 110 QT Interval:  411 QTC Calculation: 475 R Axis:   -35 Text Interpretation:  Sinus rhythm Multiple ventricular premature complexes Left axis deviation Low voltage, precordial leads similar to previous EKG other than PVCs Confirmed by Brantley Stage [phone]) on 11/13/2016 2:39:43 PM       Radiology Dg Chest 2 View  Result Date: 11/13/2016 CLINICAL DATA:  Chest pain, weakness EXAM: CHEST  2 VIEW COMPARISON:  01/03/2015 FINDINGS: Lungs are clear.  No pleural effusion or pneumothorax. The heart is normal in size. Degenerative changes of the visualized thoracolumbar spine. IMPRESSION: No evidence of acute cardiopulmonary disease. Electronically Signed   By: Julian Hy M.D.   On: 11/13/2016 16:06    Procedures Procedures (including critical care time)  Medications  Ordered in ED Medications  aspirin chewable tablet 324 mg (not  administered)  ondansetron (ZOFRAN) injection 4 mg (4 mg Intravenous Given 11/13/16 1518)     Initial Impression / Assessment and Plan / ED Course  I have reviewed the triage vital signs and the nursing notes.  Pertinent labs & imaging results that were available during my care of the patient were reviewed by me and considered in my medical decision making (see chart for details).     Presenting with episode of diaphoresis, nausea vomiting, and left-sided chest discomfort. The patient noted to be very diaphoretic she was being brought in by EMS. During ED course, symptoms resolved.   Her initial EKG does not show acute ischemic changes. Her first troponin is normal. She does have progressively worsening kidney disease with creatinine of 2.02 today. Chest x-ray visualized and shows no acute cardiopulmonary processes. History is not suggestive of PE or dissection at this time.  Heart score of 4-5, given concerning history. Plan to admit for ACS rule out.  Final Clinical Impressions(s) / ED Diagnoses   Final diagnoses:  Nonspecific chest pain  Non-intractable vomiting with nausea, unspecified vomiting type    New Prescriptions New Prescriptions   No medications on file     Forde Dandy, MD 11/13/16 [phone]

## 2016-11-13 NOTE — ED Notes (Signed)
ED Provider at bedside. 

## 2016-11-13 NOTE — ED Notes (Signed)
EDP at bedside  

## 2016-11-13 NOTE — ED Notes (Signed)
Got patient undress on the monitor did ekg shown to Dr Oleta Mouse

## 2016-11-13 NOTE — ED Triage Notes (Signed)
Pt arrives from [facility] for acute onset CP with generalized weakness, N/V, diaphoresis.

## 2016-11-14 ENCOUNTER — Other Ambulatory Visit: Payer: Self-pay | Admitting: Family

## 2016-11-14 ENCOUNTER — Observation Stay (HOSPITAL_BASED_OUTPATIENT_CLINIC_OR_DEPARTMENT_OTHER): Payer: Medicare Other

## 2016-11-14 DIAGNOSIS — N183 Chronic kidney disease, stage 3 (moderate): Secondary | ICD-10-CM

## 2016-11-14 DIAGNOSIS — R079 Chest pain, unspecified: Secondary | ICD-10-CM | POA: Diagnosis not present

## 2016-11-14 DIAGNOSIS — D631 Anemia in chronic kidney disease: Secondary | ICD-10-CM | POA: Diagnosis not present

## 2016-11-14 DIAGNOSIS — I10 Essential (primary) hypertension: Secondary | ICD-10-CM

## 2016-11-14 LAB — ECHOCARDIOGRAM COMPLETE
Height: 66 in
WEIGHTICAEL: 3547.2 [oz_av]

## 2016-11-14 LAB — NM MYOCAR MULTI W/SPECT W/WALL MOTION / EF
CHL CUP RESTING HR STRESS: 62 {beats}/min
CSEPED: 5 min
CSEPEW: 1 METS
CSEPPHR: 91 {beats}/min
Exercise duration (sec): 0 s

## 2016-11-14 LAB — MRSA PCR SCREENING: MRSA BY PCR: NEGATIVE

## 2016-11-14 LAB — BASIC METABOLIC PANEL
ANION GAP: 10 (ref 5–15)
BUN: 43 mg/dL — AB (ref 6–20)
CALCIUM: 9.7 mg/dL (ref 8.9–10.3)
CO2: 30 mmol/L (ref 22–32)
Chloride: 101 mmol/L (ref 101–111)
Creatinine, Ser: 1.73 mg/dL — ABNORMAL HIGH (ref 0.44–1.00)
GFR, EST AFRICAN AMERICAN: 29 mL/min — AB (ref >60)
GFR, EST NON AFRICAN AMERICAN: 25 mL/min — AB (ref >60)
Glucose, Bld: 92 mg/dL (ref 65–99)
POTASSIUM: 3.5 mmol/L (ref 3.5–5.1)
Sodium: 141 mmol/L (ref 135–145)

## 2016-11-14 LAB — TROPONIN I
Troponin I: <0.03 ng/mL (ref <0.03)
Troponin I: <0.03 ng/mL (ref <0.03)

## 2016-11-14 MED ORDER — TECHNETIUM TC 99M TETROFOSMIN IV KIT
30.0000 | PACK | Freq: Once | INTRAVENOUS | Status: AC | PRN
  Administered 2016-11-14: 30 via INTRAVENOUS

## 2016-11-14 MED ORDER — FAMOTIDINE 20 MG PO TABS
20.0000 mg | ORAL_TABLET | Freq: Every day | ORAL | Status: DC
Start: 2016-11-15 — End: 2016-11-15
  Administered 2016-11-15: 20 mg via ORAL
  Filled 2016-11-14: qty 1

## 2016-11-14 MED ORDER — REGADENOSON 0.4 MG/5ML IV SOLN
INTRAVENOUS | Status: AC
  Filled 2016-11-14: qty 5

## 2016-11-14 MED ORDER — TECHNETIUM TC 99M TETROFOSMIN IV KIT
10.0000 | PACK | Freq: Once | INTRAVENOUS | Status: AC | PRN
  Administered 2016-11-14: 10 via INTRAVENOUS

## 2016-11-14 MED ORDER — SODIUM CHLORIDE 0.9 % IV SOLN
INTRAVENOUS | Status: AC
  Administered 2016-11-14: 10:00:00 via INTRAVENOUS

## 2016-11-14 MED ORDER — REGADENOSON 0.4 MG/5ML IV SOLN
0.4000 mg | Freq: Once | INTRAVENOUS | Status: AC
  Administered 2016-11-14: 0.4 mg via INTRAVENOUS
  Filled 2016-11-14: qty 5

## 2016-11-14 NOTE — Discharge Summary (Signed)
Physician Discharge Summary  [name] JIR:[id number] DOB: 31-May-1923 DOA: 11/13/2016  PCP: [name], [name]  Admit date: 11/13/2016 Discharge date: 11/14/2016   Recommendations for Outpatient Follow-Up:   1. BMP 1 week re: Cr-- lasix held 2. Weight loss- outpatient malignancy work up if PCP finds necessary 3.    Discharge Diagnosis:   Principal Problem:   Chest pain Active Problems:   Essential hypertension, benign   Secondary hyperparathyroidism, renal (HCC)   Anemia associated with chronic renal failure   Chronic kidney disease, stage III (moderate)   Morbid obesity (HCC)   Allergic urticaria   History of seizures   History of stroke   Discharge disposition:  Home.  Discharge Condition: Improved.  Diet recommendation: Low sodium, heart healthy.  Wound care: None.   History of Present Illness:  [name] is an 83 y.o. female Is a 83 year old female with a PMH of stage III chronic kidney disease, stroke, seizure disorder, hypertension who presented to the hospital via EMS after she developed acute onset of nausea, vomiting, diaphoresis followed by chest discomfort while at her nephrologist's office earlier today. Patient vomited several times which made her feel a bit better. She describes the chest pain as a tightness.   Hospital Course by Problem:  Chest pain -echo/ST done by cardiology -no further work up indicated  AKI on CKD stage III -holding lasix -follow up outpatient  Hypotension -hold lasix  Anemia associated with chronic renal failure -stable  History of Morbid obesity (HCC)/abnormal weight loss Patient has lost approximately 50 pounds - defer to PCP outpatient malignancy evaluation. BMI is currently 35.51 kg/m.  Allergic urticaria Continue antihistamines.  History of seizures Continue Keppra.  History of stroke Continue risk factor modification.  Hypokalemia replete   Medical Consultants:     cards   Discharge Exam:   Vitals:   11/14/16 7510 11/14/16 1839  BP: 124/70 104/82  Pulse:    Resp:    Temp:     Vitals:   11/14/16 1241 11/14/16 1242 11/14/16 1244 11/14/16 1839  BP: (!) 157/87 131/79 124/70 104/82  Pulse:      Resp:      Temp:      TempSrc:      SpO2:      Weight:      Height:        Gen:  NAD   The results of significant diagnostics from this hospitalization (including imaging, microbiology, ancillary and laboratory) are listed below for reference.     Procedures and Diagnostic Studies:   Dg Chest 2 View  Result Date: 11/13/2016 CLINICAL DATA:  Chest pain, weakness EXAM: CHEST  2 VIEW COMPARISON:  01/03/2015 FINDINGS: Lungs are clear.  No pleural effusion or pneumothorax. The heart is normal in size. Degenerative changes of the visualized thoracolumbar spine. IMPRESSION: No evidence of acute cardiopulmonary disease. Electronically Signed   By: Julian Hy M.D.   On: 11/13/2016 16:06   Nm Myocar Multi W/spect W/wall Motion / Ef  Result Date: 11/14/2016 CLINICAL DATA:  Chest pain, hypertension, shortness of breath, chronic kidney disease stage 3 EXAM: MYOCARDIAL IMAGING WITH SPECT (REST AND PHARMACOLOGIC-STRESS) GATED LEFT VENTRICULAR WALL MOTION STUDY LEFT VENTRICULAR EJECTION FRACTION TECHNIQUE: Standard myocardial SPECT imaging was performed after resting intravenous injection of 10 mCi Tc-77m tetrofosmin. Subsequently, intravenous infusion of Lexiscan was performed under the supervision of the Cardiology staff. At peak effect of the drug, 30 mCi Tc-22m tetrofosmin was injected intravenously and standard myocardial SPECT imaging  was performed. Quantitative gated imaging was also performed to evaluate left ventricular wall motion, and estimate left ventricular ejection fraction. COMPARISON:  None available FINDINGS: Perfusion: Diffuse fixed perfusion defect of the apex, septum, and inferolateral walls. No definite significant inducible or  reversible ischemia with pharmacologic stress. Wall Motion: Mild global hypokinesis with apical dyskinesis. Left Ventricular Ejection Fraction: 54 % End diastolic volume 99 ml End systolic volume 40 ml IMPRESSION: 1. Diffuse fixed perfusion defect of the apex, septum and inferolateral walls. No significant reversible ischemia with pharmacologic stress within the limits of the study. 2. Global hypokinesis with apical dyskinesis. 3. Left ventricular ejection fraction 54% 4. Non invasive risk stratification*: Intermediate *2012 Appropriate Use Criteria for Coronary Revascularization Focused Update: J Am Coll Cardiol. 2778;24(2):353-614. http://content.airportbarriers.com.aspx?articleid=[email or web address] Electronically Signed   By: Jerilynn Mages.  Shick M.D.   On: 11/14/2016 16:09     Labs:   Basic Metabolic Panel:  Recent Labs Lab 11/13/16 1439 11/13/16 2052 11/14/16 0832  NA 139  --  141  K 3.2*  --  3.5  CL 100*  --  101  CO2 27  --  30  GLUCOSE 137*  --  92  BUN 43*  --  43*  CREATININE 2.02* 1.89* 1.73*  CALCIUM 10.1  --  9.7   GFR Estimated Creatinine Clearance: 25.3 mL/min (A) (by C-G formula based on SCr of 1.73 mg/dL (H)). Liver Function Tests:  Recent Labs Lab 11/13/16 1453  AST 28  ALT 12*  ALKPHOS 48  BILITOT 0.3  PROT 7.2  ALBUMIN 3.9    Recent Labs Lab 11/13/16 1453  LIPASE 53*   No results for input(s): AMMONIA in the last 168 hours. Coagulation profile No results for input(s): INR, PROTIME in the last 168 hours.  CBC:  Recent Labs Lab 11/13/16 1439 11/13/16 2052  WBC 10.4 10.3  HGB 10.6* 10.5*  HCT 32.5* 32.0*  MCV 72.9* 73.9*  PLT 169 142*   Cardiac Enzymes:  Recent Labs Lab 11/13/16 2052 11/14/16 0010 11/14/16 0253  TROPONINI <0.03 <0.03 <0.03   BNP: Invalid input(s): POCBNP CBG: No results for input(s): GLUCAP in the last 168 hours. D-Dimer No results for input(s): DDIMER in the last 72 hours. Hgb A1c No results for input(s): HGBA1C in the last 72  hours. Lipid Profile No results for input(s): CHOL, HDL, LDLCALC, TRIG, CHOLHDL, LDLDIRECT in the last 72 hours. Thyroid function studies No results for input(s): TSH, T4TOTAL, T3FREE, THYROIDAB in the last 72 hours.  Invalid input(s): FREET3 Anemia work up No results for input(s): VITAMINB12, FOLATE, FERRITIN, TIBC, IRON, RETICCTPCT in the last 72 hours. Microbiology Recent Results (from the past 240 hour(s))  MRSA PCR Screening     Status: None   Collection Time: 11/14/16  6:54 AM  Result Value Ref Range Status   MRSA by PCR NEGATIVE NEGATIVE Final    Comment:        The GeneXpert MRSA Assay (FDA approved for NASAL specimens only), is one component of a comprehensive MRSA colonization surveillance program. It is not intended to diagnose MRSA infection nor to guide or monitor treatment for MRSA infections.      Discharge Instructions:   Discharge Instructions    Diet - low sodium heart healthy    Complete by:  As directed    Discharge instructions    Complete by:  As directed    BMP 1 week Hold lasix until seen by PCP-- if unable to lay flat or starting to have LE edema- call PCP  to be seen ASAP   Increase activity slowly    Complete by:  As directed      Allergies as of 11/14/2016      Reactions   Advicor [niacin-lovastatin Er] Other (See Comments)   unknown   Caduet [amlodipine-atorvastatin] Other (See Comments)   unknown   Codeine Other (See Comments)   unknown   Dilantin [phenytoin] Other (See Comments)   unknown   Lescol [fluvastatin] Other (See Comments)   Pamelor [nortriptyline] Other (See Comments)   unknown   Penicillins Other (See Comments)   unknown   Sulfa Antibiotics Other (See Comments)   unknown   Trileptal [oxcarbazepine] Other (See Comments)   unknown   Ultram [tramadol] Other (See Comments)   unknown   Welchol [colesevelam] Other (See Comments)   unknown      Medication List    STOP taking these medications   albuterol 108 (90  Base) MCG/ACT inhaler Commonly known as:  PROVENTIL HFA;VENTOLIN HFA   furosemide 20 MG tablet Commonly known as:  LASIX     TAKE these medications   ARNUITY ELLIPTA 200 MCG/ACT Aepb Generic drug:  Fluticasone Furoate Inhale 1 puff into the lungs daily.   aspirin 81 MG tablet Take 81 mg by mouth daily.   betamethasone dipropionate 0.05 % lotion Apply topically 2 (two) times daily.   cetirizine 10 MG tablet Commonly known as:  ZYRTEC Take 1 tablet (10 mg total) by mouth 2 (two) times daily.   diphenhydrAMINE 25 MG tablet Commonly known as:  BENADRYL Take 25 mg by mouth every morning.   escitalopram 5 MG tablet Commonly known as:  LEXAPRO TAKE 1 TABLET (5 MG TOTAL) BY MOUTH DAILY.   ferrous sulfate 325 (65 FE) MG tablet Take 325 mg by mouth every Monday, Wednesday, and Friday.   hydrOXYzine 50 MG tablet Commonly known as:  ATARAX/VISTARIL TAKE 1 TABLET (50 MG TOTAL) BY MOUTH EVERY 8 (EIGHT) HOURS AS NEEDED FOR ITCHING.   levETIRAcetam 500 MG 24 hr tablet Commonly known as:  KEPPRA XR Take 3 tablets (1,500 mg total) by mouth at bedtime.   metolazone 2.5 MG tablet Commonly known as:  ZAROXOLYN TAKE 1 TABLET ON MON, WED, AND FRIDAY   mometasone 0.1 % ointment Commonly known as:  ELOCON Apply to skin once daily as directed after bathing.   montelukast 10 MG tablet Commonly known as:  SINGULAIR Take 1 tablet (10 mg total) by mouth at bedtime.   nystatin-triamcinolone ointment Commonly known as:  MYCOLOG Apply 1 application topically 2 (two) times daily.   omeprazole 40 MG capsule Commonly known as:  PRILOSEC Take 1 capsule (40 mg total) by mouth daily.   ranitidine 150 MG tablet Commonly known as:  ZANTAC Take 1 tablet (150 mg total) by mouth 2 (two) times daily.   ULORIC 80 MG Tabs Generic drug:  Febuxostat Take 1 tablet by mouth daily. What changed:  Another medication with the same name was removed. Continue taking this medication, and follow the  directions you see here.      Follow-up Information    Imogene Burn, PA-C Follow up on 11/26/2016.   Specialty:  Cardiology Why:  11:15 am for hospital follow up Contact information: [address] [place] [address] [phone]        [name] A, [name] Follow up in 1 week(s).   Specialty:  Family Medicine Contact information: [place] [address] [phone]  Time coordinating discharge: 35 min  Signed:  Loretto Belinsky U Clifford Benninger   [facility] 11/14/2016, 7:21 PM

## 2016-11-14 NOTE — Plan of Care (Signed)
Problem: Activity: Goal: Ability to tolerate increased activity will improve Outcome: Progressing Verbalized understanding

## 2016-11-14 NOTE — Consult Note (Signed)
Cardiology Consult    Patient ID: [name] MRN: [id number], DOB/AGE: July 11, 1923   Admit date: 11/13/2016 Date of Consult: 11/14/2016  Primary Physician: [name], [name] Primary Cardiologist: new - Dr. Radford Pax Requesting Provider: Dr. Eliseo Squires  Reason for Consult: chest pain  Patient Profile    [name] has a PMH significant for HTN, CKD stage III, hx of obesity, seizure disorder, hx of stroke, and LEE. She presented to [facility] with chest discomfort, nausea, and vomiting.  [name] is a 83 y.o. female who is being seen today for the evaluation of chest pain at the request of Dr. Eliseo Squires.   Past Medical History   Past Medical History:  Diagnosis Date  . Chronic low back pain   . Degenerative arthritis   . Eczema   . Edema   . Gout   . Headache   . Hiatal hernia   . Hypertension   . Internal hemorrhoids   . Neurogenic claudication   . Obesity   . Seizure disorder ([place])   . Seizures ([place])   . Stroke ([place])   . Urticaria     Past Surgical History:  Procedure Laterality Date  . ABDOMINAL HYSTERECTOMY    . APPENDECTOMY    . APPENDECTOMY    . bilateral knees    . cataracts    . CHOLECYSTECTOMY    . mammoplasty reduction    . NEUROPLASTY / TRANSPOSITION MEDIAN NERVE AT CARPAL TUNNEL BILATERAL    . RE-EXCISION OF BREAST LUMPECTOMY    . TUBAL LIGATION       Allergies  Allergies  Allergen Reactions  . Advicor [Niacin-Lovastatin Er] Other (See Comments)    unknown  . Caduet [Amlodipine-Atorvastatin] Other (See Comments)    unknown  . Codeine Other (See Comments)    unknown  . Dilantin [Phenytoin] Other (See Comments)    unknown  . Lescol [Fluvastatin] Other (See Comments)  . Pamelor [Nortriptyline] Other (See Comments)    unknown  . Penicillins Other (See Comments)    unknown  . Sulfa Antibiotics Other (See Comments)    unknown  . Trileptal [Oxcarbazepine] Other (See Comments)    unknown  . Ultram [Tramadol] Other (See Comments)    unknown  .  Welchol [Colesevelam] Other (See Comments)    unknown    History of Present Illness    [name] does not have a cardiac history and does not see a cardiologist at this time. History was difficult to obtain from patient.   On my interview, she describes and episode at her nephrology OP clinic visit. She was in a wheelchair and became diaphoretic and flush. She requested to open the room door for influx of cool air. She tried to go to the bathroom, but became nauseous and vomited several times. After vomiting, she felt much better. She denied chest pain. The nephrologist suggested she present to ED for further evaluation.  She states that she has been sick since 2016. She had PNA at that time and developed "chest pain" on her left side associated with the PNA. The pain is described as a nagging pain and has no relieving or aggravating factors. There are no associated symptoms. She also complains of left neck, shoulder, and arm tingling/pain that she attributes to arthritis. She has had this pain "all year" without relieving or aggravating factors. She denies chest pain in the past week and denies chest pain yesterday. She also denies palpitations, SOB, dizziness, and feelings of syncope.  Inpatient Medications    . aspirin EC  81 mg Oral Daily  . enoxaparin (LOVENOX) injection  30 mg Subcutaneous Q24H  . escitalopram  5 mg Oral Daily  . famotidine  20 mg Oral BID  . febuxostat  80 mg Oral Daily  . ferrous sulfate  325 mg Oral Q M,W,F  . fluticasone furoate-vilanterol  1 puff Inhalation Daily  . levETIRAcetam  1,500 mg Oral QHS  . loratadine  10 mg Oral Daily  . montelukast  10 mg Oral QHS  . pantoprazole  40 mg Oral Daily  . polyethylene glycol  17 g Oral Daily     Outpatient Medications    Prior to Admission medications   Medication Sig Start Date End Date Taking? Authorizing Provider  ARNUITY ELLIPTA 200 MCG/ACT AEPB Inhale 1 puff into the lungs daily. 10/30/16  Yes [provider]  aspirin 81 MG tablet Take 81 mg by mouth daily.   Yes [provider]  betamethasone dipropionate 0.05 % lotion Apply topically 2 (two) times daily. 05/18/16  Yes Hawks, Christy A, [name]  cetirizine (ZYRTEC) 10 MG tablet Take 1 tablet (10 mg total) by mouth 2 (two) times daily. 08/01/16  Yes Kozlow, Donnamarie Poag, MD  diphenhydrAMINE (BENADRYL) 25 MG tablet Take 25 mg by mouth every morning.   Yes [provider]  escitalopram (LEXAPRO) 5 MG tablet TAKE 1 TABLET (5 MG TOTAL) BY MOUTH DAILY. 10/25/16  Yes Hawks, Christy A, [name]  febuxostat (ULORIC) 40 MG tablet Take 1 tablet (40 mg total) by mouth daily. Patient taking differently: Take 20 mg by mouth daily.  09/03/16  Yes [name] A, [name]  ferrous sulfate 325 (65 FE) MG tablet Take 325 mg by mouth every Monday, Wednesday, and Friday.    Yes [provider]  furosemide (LASIX) 20 MG tablet TAKE 1 TABLET (20 MG TOTAL) BY MOUTH DAILY. 10/08/16  Yes Hawks, Christy A, [name]  levETIRAcetam (KEPPRA XR) 500 MG 24 hr tablet Take 3 tablets (1,500 mg total) by mouth at bedtime. 07/10/16  Yes Ward Givens, NP  metolazone (ZAROXOLYN) 2.5 MG tablet TAKE 1 TABLET ON MON, WED, AND FRIDAY 03/12/16  Yes Hawks, Christy A, [name]  mometasone (ELOCON) 0.1 % ointment Apply to skin once daily as directed after bathing. 08/21/16  Yes Kozlow, Donnamarie Poag, MD  montelukast (SINGULAIR) 10 MG tablet Take 1 tablet (10 mg total) by mouth at bedtime. 08/01/16  Yes Kozlow, Donnamarie Poag, MD  nystatin-triamcinolone ointment [facility]) Apply 1 application topically 2 (two) times daily. 11/16/15  Yes Hawks, Christy A, [name]  omeprazole (PRILOSEC) 40 MG capsule Take 1 capsule (40 mg total) by mouth daily. 08/21/16  Yes Kozlow, Donnamarie Poag, MD  ranitidine (ZANTAC) 150 MG tablet Take 1 tablet (150 mg total) by mouth 2 (two) times daily. 08/01/16  Yes Kozlow, Donnamarie Poag, MD  ULORIC 80 MG TABS Take 1 tablet by mouth daily. 08/20/16  Yes [provider]  albuterol (PROVENTIL  HFA;VENTOLIN HFA) 108 (90 BASE) MCG/ACT inhaler Inhale 2 puffs into the lungs every 6 (six) hours as needed for wheezing or shortness of breath. Patient not taking: Reported on 11/13/2016 01/03/15   Chipper Herb, MD  hydrOXYzine (ATARAX/VISTARIL) 50 MG tablet TAKE 1 TABLET (50 MG TOTAL) BY MOUTH EVERY 8 (EIGHT) HOURS AS NEEDED FOR ITCHING. 09/30/15   [name], [name]     Family History     Family History  Problem Relation Age of Onset  . Diabetes Mother   .  Heart disease Mother   . Heart disease Father   . Cancer Sister   . Cancer Brother   . Cancer Sister     Social History    Social History   Social History  . Marital status: Widowed    Spouse name: N/A  . Number of children: 4  . Years of education: N/A   Occupational History  . Not on file.   Social History Main Topics  . Smoking status: Never Smoker  . Smokeless tobacco: Never Used  . Alcohol use No  . Drug use: No  . Sexual activity: Not on file   Other Topics Concern  . Not on file   Social History Narrative   Patient is right handed.   Patient drinks some caffeine daily.        Review of Systems    General:  No chills, fever, night sweats or weight changes.  Cardiovascular:  No chest pain, dyspnea on exertion, edema, orthopnea, palpitations, paroxysmal nocturnal dyspnea. Dermatological: No rash, lesions/masses Respiratory: No cough, dyspnea Urologic: No hematuria, dysuria Abdominal:   No nausea, vomiting, diarrhea, bright red blood per rectum, melena, or hematemesis Neurologic:  No visual changes, wkns, changes in mental status. All other systems reviewed and are otherwise negative except as noted above.  Physical Exam    Blood pressure (!) 106/49, pulse 84, temperature 97.9 F (36.6 C), temperature source Oral, resp. rate 18, height 5\' 6"  (1.676 m), weight 221 lb 11.2 oz (100.6 kg), SpO2 100 %.  General: Pleasant, NAD Psych: Normal affect. Neuro: Alert and oriented X 3. Moves all  extremities spontaneously. HEENT: Normal  Neck: Supple without bruits or JVD. Lungs:  Resp regular and unlabored, CTA. Heart: RRR no s3, s4, or murmurs. Abdomen: Soft, non-tender, non-distended, BS + x 4.  Extremities: No clubbing, cyanosis or edema. DP/PT/Radials 2+ and equal bilaterally.  Labs    Troponin [facility] Test)  Recent Labs  11/13/16 1457  TROPIPOC 0.00    Recent Labs  11/13/16 2052 11/14/16 0010 11/14/16 0253  TROPONINI <0.03 <0.03 <0.03   Lab Results  Component Value Date   WBC 10.3 11/13/2016   HGB 10.5 (L) 11/13/2016   HCT 32.0 (L) 11/13/2016   MCV 73.9 (L) 11/13/2016   PLT 142 (L) 11/13/2016    Recent Labs Lab 11/13/16 1439 11/13/16 1453 11/13/16 2052  NA 139  --   --   K 3.2*  --   --   CL 100*  --   --   CO2 27  --   --   BUN 43*  --   --   CREATININE 2.02*  --  1.89*  CALCIUM 10.1  --   --   PROT  --  7.2  --   BILITOT  --  0.3  --   ALKPHOS  --  48  --   ALT  --  12*  --   AST  --  28  --   GLUCOSE 137*  --   --    Lab Results  Component Value Date   CHOL 172 08/04/2015   HDL 38 (L) 08/04/2015   LDLCALC 83 08/04/2015   TRIG 255 (H) 08/04/2015   No results found for: [facility]   Radiology Studies    Dg Chest 2 View  Result Date: 11/13/2016 CLINICAL DATA:  Chest pain, weakness EXAM: CHEST  2 VIEW COMPARISON:  01/03/2015 FINDINGS: Lungs are clear.  No pleural effusion or pneumothorax. The heart is normal  in size. Degenerative changes of the visualized thoracolumbar spine. IMPRESSION: No evidence of acute cardiopulmonary disease. Electronically Signed   By: Julian Hy M.D.   On: 11/13/2016 16:06    ECG & Cardiac Imaging    EKG 11/13/16: sinus rhythm  Assessment & Plan    1. Chest pain - troponin x 3 negative - EKG without signs of ischemia - administered ASA 324 mg  She describes atypical chest pain and left neck/shoulder/arm tingling since Jan 2018. She is somewhat of a poor historian.  Her episode yesterday at  the MD office is concerning though.  She had eaten about an hour before and then started having tightness in her chest.  She has risk factors for ACS including obesity and HTN.    2. HTN - pressure has been labile this admission, will hold off on antihypertensives for now - she is not on BP meds at home   3. CKD stage III - per primary team   4. Hx of seizures - continue keppra   5. Hypokalemia - replaced K 3.2 with kdur last night - repeat BMP, replace as needed    Signed, Ledora Bottcher, PA-C 11/14/2016, 7:42 AM [phone]  Patient seen and independently examined with Fabian Sharp, PA. We discussed all aspects of the encounter. I agree with the assessment and plan as stated above.  Patient has no history of CAD but has CRFs including advanced age, obesity, h/o CVA, remote tobacco use, family history of heart disease and HTN.  She has been having problems with left sided neck and shoulder pain since January and also occasional CP on her left side which is somewhat atypica.  Yesterday had eaten breakfast and felt tight in her chest.  She started to break out in a sweat and then have significant emesis.  She was brought to [facility] and EKG showed no ischemia.  Her Trop was negative x 3.  Despite her age, she gets around well and is able to do her ADLs with no problems.  She does have CKD and creatinine at baseline is around 1.3 but on admit was 2 likely from vomiting.  Her exam is benign today with clear lungs, heart RRR with no M/R/R and no edema of the LE.  Recommend proceeding with nuclear stress test to rule out ischemia.  At this time, as long as creatinine remains elevated, would only consider cath if there is a large amount of ischemia.   Signed: Fransico Him, MD [facility] 11/14/2016

## 2016-11-14 NOTE — Progress Notes (Signed)
  Echocardiogram 2D Echocardiogram has been performed.  [name] 11/14/2016, 2:07 PM

## 2016-11-14 NOTE — Plan of Care (Signed)
Problem: Health Behavior/Discharge Planning: Goal: Ability to safely manage health-related needs after discharge will improve Outcome: Progressing Verbalized understanding

## 2016-11-14 NOTE — Progress Notes (Signed)
PROGRESS NOTE    [name]  TGG:[id number] DOB: Oct 25, 1923 DOA: 11/13/2016 PCP: [name], [name]   Outpatient Specialists:     Brief Narrative:  [name] is an 83 y.o. female Is a 83 year old female with a PMH of stage III chronic kidney disease, stroke, seizure disorder, hypertension who presented to the hospital via EMS after she developed acute onset of nausea, vomiting, diaphoresis followed by chest discomfort while at her nephrologist's office earlier today. Patient vomited several times which made her feel a bit better. She describes the chest pain as a tightness.   Assessment & Plan:   Principal Problem:   Chest pain Active Problems:   Essential hypertension, benign   Secondary hyperparathyroidism, renal (HCC)   Anemia associated with chronic renal failure   Chronic kidney disease, stage III (moderate)   Morbid obesity (HCC)   Allergic urticaria   History of seizures   History of stroke   Chest pain associated with nausea, vomiting and diaphoresis -aspirin -CE: negative -cards consult-- plan for nuclear medicine    Essential hypertension, benign -BP low -Hold all antihypertensives for now.    Stage III chronic kidney disease/Secondary hyperparathyroidism, renal (HCC) -monitor Cr -avoid nephrotoxins    Anemia associated with chronic renal failure -stable    History of Morbid obesity (HCC)/abnormal weight loss Patient has lost approximately 50 pounds - defer to PCP outpatient malignancy evaluation. BMI is currently 35.51 kg/m.    Allergic urticaria Continue antihistamines.    History of seizures Continue Keppra.    History of stroke Continue risk factor modification.    Hypokalemia replete   DVT prophylaxis:  Lovenox   Code Status: Full Code   Family Communication:   Disposition Plan:     Consultants:   cards    Subjective: No further chest pain  Objective: Vitals:   11/14/16 1228 11/14/16 1241  11/14/16 1242 11/14/16 1244  BP: (!) 148/77 (!) 157/87 131/79 124/70  Pulse:      Resp:      Temp:      TempSrc:      SpO2:      Weight:      Height:        Intake/Output Summary (Last 24 hours) at 11/14/16 1319 Last data filed at 11/14/16 1000  Gross per 24 hour  Intake              437 ml  Output              750 ml  Net             -313 ml   Filed Weights   11/13/16 1438 11/13/16 2039 11/14/16 0300  Weight: 99.8 kg (220 lb) 100.4 kg (221 lb 6.4 oz) 100.6 kg (221 lb 11.2 oz)    Examination:  General exam: Appears calm and comfortable - teeth out and speech difficult Respiratory system: Clear to auscultation. Respiratory effort normal. Cardiovascular system: S1 & S2 heard, RRR. No JVD, murmurs, rubs, gallops or clicks. No pedal edema. Gastrointestinal system: Abdomen is nondistended, soft and nontender. No organomegaly or masses felt. Normal bowel sounds heard. Central nervous system: Alert. No focal neurological deficits. Extremities: Symmetric 5 x 5 power. Skin: No rashes, lesions or ulcers     Data Reviewed: I have personally reviewed following labs and imaging studies  CBC:  Recent Labs Lab 11/13/16 1439 11/13/16 2052  WBC 10.4 10.3  HGB 10.6* 10.5*  HCT 32.5* 32.0*  MCV 72.9* 73.9*  PLT 169 696*   Basic Metabolic Panel:  Recent Labs Lab 11/13/16 1439 11/13/16 2052 11/14/16 0832  NA 139  --  141  K 3.2*  --  3.5  CL 100*  --  101  CO2 27  --  30  GLUCOSE 137*  --  92  BUN 43*  --  43*  CREATININE 2.02* 1.89* 1.73*  CALCIUM 10.1  --  9.7   GFR: Estimated Creatinine Clearance: 25.3 mL/min (A) (by C-G formula based on SCr of 1.73 mg/dL (H)). Liver Function Tests:  Recent Labs Lab 11/13/16 1453  AST 28  ALT 12*  ALKPHOS 48  BILITOT 0.3  PROT 7.2  ALBUMIN 3.9    Recent Labs Lab 11/13/16 1453  LIPASE 53*   No results for input(s): AMMONIA in the last 168 hours. Coagulation Profile: No results for input(s): INR, PROTIME in the last  168 hours. Cardiac Enzymes:  Recent Labs Lab 11/13/16 2052 11/14/16 0010 11/14/16 0253  TROPONINI <0.03 <0.03 <0.03   BNP (last 3 results) No results for input(s): PROBNP in the last 8760 hours. HbA1C: No results for input(s): HGBA1C in the last 72 hours. CBG: No results for input(s): GLUCAP in the last 168 hours. Lipid Profile: No results for input(s): CHOL, HDL, LDLCALC, TRIG, CHOLHDL, LDLDIRECT in the last 72 hours. Thyroid Function Tests: No results for input(s): TSH, T4TOTAL, FREET4, T3FREE, THYROIDAB in the last 72 hours. Anemia Panel: No results for input(s): VITAMINB12, FOLATE, FERRITIN, TIBC, IRON, RETICCTPCT in the last 72 hours. Urine analysis:    Component Value Date/Time   COLORURINE YELLOW 10/11/2015 1805   APPEARANCEUR CLEAR 10/11/2015 1805   LABSPEC 1.010 10/11/2015 1805   PHURINE 5.5 10/11/2015 1805   GLUCOSEU NEGATIVE 10/11/2015 1805   HGBUR NEGATIVE 10/11/2015 1805   BILIRUBINUR NEGATIVE 10/11/2015 1805   BILIRUBINUR neg 03/09/2015 1652   KETONESUR NEGATIVE 10/11/2015 1805   PROTEINUR NEGATIVE 10/11/2015 1805   UROBILINOGEN negative 03/09/2015 1652   NITRITE NEGATIVE 10/11/2015 1805   LEUKOCYTESUR NEGATIVE 10/11/2015 1805    ) Recent Results (from the past 240 hour(s))  MRSA PCR Screening     Status: None   Collection Time: 11/14/16  6:54 AM  Result Value Ref Range Status   MRSA by PCR NEGATIVE NEGATIVE Final    Comment:        The GeneXpert MRSA Assay (FDA approved for NASAL specimens only), is one component of a comprehensive MRSA colonization surveillance program. It is not intended to diagnose MRSA infection nor to guide or monitor treatment for MRSA infections.       Anti-infectives    None       Radiology Studies: Dg Chest 2 View  Result Date: 11/13/2016 CLINICAL DATA:  Chest pain, weakness EXAM: CHEST  2 VIEW COMPARISON:  01/03/2015 FINDINGS: Lungs are clear.  No pleural effusion or pneumothorax. The heart is normal in  size. Degenerative changes of the visualized thoracolumbar spine. IMPRESSION: No evidence of acute cardiopulmonary disease. Electronically Signed   By: Julian Hy M.D.   On: 11/13/2016 16:06        Scheduled Meds: . aspirin EC  81 mg Oral Daily  . enoxaparin (LOVENOX) injection  30 mg Subcutaneous Q24H  . escitalopram  5 mg Oral Daily  . [START ON 11/15/2016] famotidine  20 mg Oral Daily  . febuxostat  80 mg Oral Daily  . ferrous sulfate  325 mg Oral Q M,W,F  . fluticasone furoate-vilanterol  1 puff Inhalation Daily  . levETIRAcetam  1,500 mg Oral QHS  . loratadine  10 mg Oral Daily  . montelukast  10 mg Oral QHS  . pantoprazole  40 mg Oral Daily  . polyethylene glycol  17 g Oral Daily  . regadenoson       Continuous Infusions: . sodium chloride 75 mL/hr at 11/14/16 1000     LOS: 0 days    Time spent: 25 min    [place], DO [facility] Pager [phone]  If 7PM-7AM, please contact night-coverage [email or web address] Password [email or web address] 11/14/2016, 1:19 PM

## 2016-11-14 NOTE — Plan of Care (Signed)
Problem: Cardiac: Goal: Ability to achieve and maintain adequate cardiovascular perfusion will improve Outcome: Progressing Verbalized understanding  Problem: Health Behavior/Discharge Planning: Goal: Ability to safely manage health-related needs after discharge will improve Outcome: Progressing Verbalized understanding

## 2016-11-14 NOTE — Care Management Obs Status (Signed)
[facility] NOTIFICATION   Patient Details  Name: [name] MRN: [id number] Date of Birth: 10/22/1923   Medicare Observation Status Notification Given:  Yes    Bethena Roys, RN 11/14/2016, 3:50 PM

## 2016-11-14 NOTE — Progress Notes (Signed)
Myoview result below:  IMPRESSION: 1. Diffuse fixed perfusion defect of the apex, septum and inferolateral walls.  No significant reversible ischemia with pharmacologic stress within the limits of the study.  2. Global hypokinesis with apical dyskinesis.  3. Left ventricular ejection fraction 54%  4. Non invasive risk stratification*: Intermediate   Echo result: LV EF: 55% -   60%  ------------------------------------------------------------------- Indications:      Chest pain 786.51.  ------------------------------------------------------------------- History:   Risk factors:  Hypertension. Obese.  ------------------------------------------------------------------- Study Conclusions  - Left ventricle: The cavity size was normal. Wall thickness was   increased in a pattern of mild LVH. Systolic function was normal.   The estimated ejection fraction was in the range of 55% to 60%.   Although no diagnostic regional wall motion abnormality was   identified, this possibility cannot be completely excluded on the   basis of this study. Doppler parameters are consistent with   abnormal left ventricular relaxation (grade 1 diastolic   dysfunction). - Aortic valve: There was no stenosis. - Mitral valve: There was trivial regurgitation. - Left atrium: The atrium was moderately dilated. - Right ventricle: The cavity size was normal. Systolic function   was normal. - Right atrium: The atrium was mildly dilated. - Tricuspid valve: Peak RV-RA gradient (S): 26 mm Hg. - Pulmonary arteries: PA peak pressure: 29 mm Hg (S). - Inferior vena cava: The vessel was normal in size. The   respirophasic diameter changes were in the normal range (>= 50%),   consistent with normal central venous pressure.  Impressions:  - Normal LV size with mild LV hypertrophy. EF 55-60%. Normal RV   size and systolic function. Biatrial enlargement. No significant   valvular  abnormalities.      No further workup from cardiology perspective. Patient is stable for discharge.  Hilbert Corrigan PA Pager: [phone]

## 2016-11-15 LAB — BASIC METABOLIC PANEL
ANION GAP: 9 (ref 5–15)
BUN: 38 mg/dL — AB (ref 6–20)
CO2: 28 mmol/L (ref 22–32)
Calcium: 9.5 mg/dL (ref 8.9–10.3)
Chloride: 102 mmol/L (ref 101–111)
Creatinine, Ser: 1.66 mg/dL — ABNORMAL HIGH (ref 0.44–1.00)
GFR calc Af Amer: 30 mL/min — ABNORMAL LOW (ref >60)
GFR calc non Af Amer: 26 mL/min — ABNORMAL LOW (ref >60)
Glucose, Bld: 91 mg/dL (ref 65–99)
POTASSIUM: 3.4 mmol/L — AB (ref 3.5–5.1)
Sodium: 139 mmol/L (ref 135–145)

## 2016-11-15 LAB — CBC
HEMATOCRIT: 29.6 % — AB (ref 36.0–46.0)
Hemoglobin: 9.7 g/dL — ABNORMAL LOW (ref 12.0–15.0)
MCH: 24.2 pg — AB (ref 26.0–34.0)
MCHC: 32.8 g/dL (ref 30.0–36.0)
MCV: 73.8 fL — AB (ref 78.0–100.0)
PLATELETS: 133 10*3/uL — AB (ref 150–400)
RBC: 4.01 MIL/uL (ref 3.87–5.11)
RDW: 17.8 % — AB (ref 11.5–15.5)
WBC: 8.4 10*3/uL (ref 4.0–10.5)

## 2016-11-15 MED ORDER — FUROSEMIDE 20 MG PO TABS: 20.0000 mg | ORAL_TABLET | Freq: Every day | ORAL | 4 refills | Status: DC

## 2016-11-15 NOTE — Progress Notes (Signed)
Nuclear stress test with no high risk features and no obvious ischemia.  Echo with normal LVF.  Given advanced age would not pursue further workup.  Will sign off.  Call with any questions.

## 2016-11-15 NOTE — Telephone Encounter (Signed)
Patient in the hospital right now   Did nt know if I should fill in case they change her meds

## 2016-11-15 NOTE — Progress Notes (Signed)
82 year old admitted for chest discomfort, underwent stress test, without any obvious signs of ischemia, not a candidate for further work up as per cardiology. Was discharged yesterday,but couldn't go home as she didn't have a ride.  She is stable to be discharged today. Please see d/c summary by dr Eliseo Squires from 8/1.    Hosie Poisson, MD [phone]

## 2016-11-16 ENCOUNTER — Telehealth: Payer: Self-pay | Admitting: *Deleted

## 2016-11-16 NOTE — Telephone Encounter (Signed)
Call Completed and Appointment Scheduled: Yes, Date: 11/23/16 with [name], [name]   DISCHARGE INFORMATION Date of Discharge:11/14/16  Discharge Facility: [facility]  Principal Discharge Diagnosis: chest pain  Patient and/or caregiver is knowledgeable of his/her condition(s) and treatment: Yes. Spoke with patient's daughter, [name]  MEDICATION RECONCILIATION Current medication list reviewed with patient:Yes  Outpatient Encounter Prescriptions as of 11/16/2016  Medication Sig  . ARNUITY ELLIPTA 200 MCG/ACT AEPB Inhale 1 puff into the lungs daily.  Marland Kitchen aspirin 81 MG tablet Take 81 mg by mouth daily.  . betamethasone dipropionate 0.05 % lotion Apply topically 2 (two) times daily.  . cetirizine (ZYRTEC) 10 MG tablet Take 1 tablet (10 mg total) by mouth 2 (two) times daily.  . diphenhydrAMINE (BENADRYL) 25 MG tablet Take 25 mg by mouth every morning.  . escitalopram (LEXAPRO) 5 MG tablet TAKE 1 TABLET (5 MG TOTAL) BY MOUTH DAILY.  . ferrous sulfate 325 (65 FE) MG tablet Take 325 mg by mouth every Monday, Wednesday, and Friday.   . hydrOXYzine (ATARAX/VISTARIL) 50 MG tablet TAKE 1 TABLET (50 MG TOTAL) BY MOUTH EVERY 8 (EIGHT) HOURS AS NEEDED FOR ITCHING.  . levETIRAcetam (KEPPRA XR) 500 MG 24 hr tablet Take 3 tablets (1,500 mg total) by mouth at bedtime.  . metolazone (ZAROXOLYN) 2.5 MG tablet TAKE 1 TABLET ON MON, WED, AND FRIDAY  . mometasone (ELOCON) 0.1 % ointment Apply to skin once daily as directed after bathing.  . montelukast (SINGULAIR) 10 MG tablet Take 1 tablet (10 mg total) by mouth at bedtime.  Marland Kitchen nystatin-triamcinolone ointment (MYCOLOG) Apply 1 application topically 2 (two) times daily.  Marland Kitchen omeprazole (PRILOSEC) 40 MG capsule Take 1 capsule (40 mg total) by mouth daily.  . ranitidine (ZANTAC) 150 MG tablet Take 1 tablet (150 mg total) by mouth 2 (two) times daily.  Marland Kitchen ULORIC 80 MG TABS TAKE 1 TABLET EVERY DAY  . [START ON 11/22/2016] furosemide (LASIX) 20 MG tablet Take 1 tablet (20 mg  total) by mouth daily. (Patient not taking: Reported on 11/16/2016)  . [DISCONTINUED] ULORIC 80 MG TABS Take 1 tablet by mouth daily.   No facility-administered encounter medications on file as of 11/16/2016.     Discharge Medications reviewed and reconciled with current medications.yes  Patient is able to obtain needed medications:Yes  ACTIVITIES OF DAILY LIVING  Is the patient able to perform his/her own ADLs: No. Has help from daughter.  Patient is receiving home health services: No.  PATIENT EDUCATION Questions/Concerns Discussed: Patient is holding lasix per instructions at discharge. No difficulty breathing or lower extremity edema. They will f/u sooner if necessary.  Needs BMP at visit.

## 2016-11-23 ENCOUNTER — Encounter: Payer: Self-pay | Admitting: *Deleted

## 2016-11-23 ENCOUNTER — Encounter: Payer: Self-pay | Admitting: Family

## 2016-11-23 ENCOUNTER — Ambulatory Visit (INDEPENDENT_AMBULATORY_CARE_PROVIDER_SITE_OTHER): Payer: Medicare Other | Admitting: Family

## 2016-11-23 VITALS — BP 136/87 | HR 96 | Temp 97.0°F | Ht 66.0 in | Wt 225.0 lb

## 2016-11-23 DIAGNOSIS — N183 Chronic kidney disease, stage 3 unspecified: Secondary | ICD-10-CM

## 2016-11-23 DIAGNOSIS — R112 Nausea with vomiting, unspecified: Secondary | ICD-10-CM

## 2016-11-23 DIAGNOSIS — I10 Essential (primary) hypertension: Secondary | ICD-10-CM

## 2016-11-23 DIAGNOSIS — Z09 Encounter for follow-up examination after completed treatment for conditions other than malignant neoplasm: Secondary | ICD-10-CM | POA: Diagnosis not present

## 2016-11-23 DIAGNOSIS — R079 Chest pain, unspecified: Secondary | ICD-10-CM | POA: Diagnosis not present

## 2016-11-23 MED ORDER — FEBUXOSTAT 40 MG PO TABS: 40.0000 mg | ORAL_TABLET | Freq: Every day | ORAL | 1 refills | Status: DC

## 2016-11-23 NOTE — Progress Notes (Signed)
   Subjective:    Patient ID: [name], female    DOB: Nov 19, 1923, 83 y.o.   MRN: [id number]  HPI Pt presents to the office for hospital follow up for nausea, vomiting, diaphoresis, and chest tightness on 11/13/16. PT was discharged on 11/14/16 and had RN contact pt on 11/16/16.  PT was told to hold lasix until we check her creatinine today. Pt' had negative ECHO, CXR.   Pt denies any nausea, vomiting, or chest tightness. States she is feeling much better.    Review of Systems  Constitutional: Positive for fatigue.  All other systems reviewed and are negative.      Objective:   Physical Exam  Constitutional: She is oriented to person, place, and time. She appears well-developed and well-nourished. No distress.  Morbid obese  HENT:  Head: Normocephalic and atraumatic.  Right Ear: External ear normal.  Left Ear: External ear normal.  Nose: Nose normal.  Mouth/Throat: Oropharynx is clear and moist.  Eyes: Pupils are equal, round, and reactive to light.  Neck: Normal range of motion. Neck supple. No thyromegaly present.  Cardiovascular: Normal rate, regular rhythm, normal heart sounds and intact distal pulses.   No murmur heard. Pulmonary/Chest: Effort normal and breath sounds normal. No respiratory distress. She has no wheezes.  Abdominal: Soft. Bowel sounds are normal. She exhibits no distension. There is no tenderness.  Musculoskeletal: Normal range of motion. She exhibits no edema or tenderness.  Neurological: She is alert and oriented to person, place, and time.  Skin: Skin is warm and dry.  Psychiatric: She has a normal mood and affect. Her behavior is normal. Judgment and thought content normal.  Vitals reviewed.    BP 136/87   Pulse 96   Temp (!) 97 F (36.1 C) (Oral)   Ht '5\' 6"'$  (1.676 m)   Wt 225 lb (102.1 kg)   BMI 36.32 kg/m      Assessment & Plan:  1. Hospital discharge follow-up - CMP14+EGFR  2. Essential hypertension, benign - CMP14+EGFR  3.  Chronic kidney disease, stage III (moderate) - CMP14+EGFR  4. Chest pain, unspecified type - CMP14+EGFR  Chest pain resolved at this time CMP pending- Pt has already restarted Lasix at this time Keep all follow up appts with Allergens and Nephrologists  RTO and keep chronic follow up   [name], [name]

## 2016-11-23 NOTE — Patient Instructions (Signed)
Chronic Kidney Disease, Adult Chronic kidney disease (CKD) occurs when the kidneys are damaged during a period of 3 or more months. The kidneys are two organs that do many important jobs in the body, which include:  Removing wastes and extra fluids from the blood.  Making hormones that maintain the amount of fluid in your tissues and blood vessels.  Maintaining the right amount of fluids and chemicals in the body.  A small amount of kidney damage may not cause problems, but a large amount of damage may make it difficult or impossible for the kidneys to work the way they should. If steps are not taken to slow down the kidney damage or stop it from getting worse, the kidneys may stop working permanently (end stage kidney disease). Most of the time, CKD does not go away, but it can often be controlled. People who have CKD can usually live normal lives. What are the causes? The most common causes of this condition are diabetes and high blood pressure (hypertension). Other causes include:  Heart and blood vessel (cardiovascular) disease.  Kidney diseases: ? Glomerulonephritis. ? Interstitial nephritis. ? Polycystic kidney disease. ? Renal vascular disease.  Diseases that affect the immune system.  Genetic diseases.  Medicines that damage the kidneys, such as anti-inflammatory medicines.  Poisoning.  Being around or in contact with poisonous (toxic) substances.  A kidney or urinary infection that occurs again (recurs).  Vasculitis.  Repeat kidney infections.  A problem with urine flow that may be caused by: ? Cancer. ? Having kidney stones more than one time. ? An enlarged prostate in males.  What increases the risk? This condition is more likely to develop in people who are:  Older than age 68.  Female.  Of African-American descent.  Current smokers or former smokers.  Obese.  You may also have an increased risk for CKD if you have a family history of CKDor  youfrequently take medicines that are damaging to the kidneys. What are the signs or symptoms? Symptoms develop slowly and may not be obvious until the kidney damage becomes severe. It is possible to have a kidney disease for years without showing any symptoms. Symptoms of this condition can include:  Swelling (edema) of the face, legs, ankles, or feet.  Numbness, tingling, or loss of feeling (sensation) in the hands or feet.  Tiredness (lethargy).  Nausea or vomiting.  Confusion or trouble concentrating.  Problems with urination, such as: ? Painful or burning feeling during urination. ? Decreased urine production. ? Frequent urination, especially at night. ? Bloody urine.  Muscle twitches and cramps, especially in the legs.  Shortness of breath.  Weakness.  Constant itchiness.  Loss of appetite.  Metallic taste in the mouth.  Trouble sleeping.  Pale lining of the eyelids and surface of the eye (conjunctiva).  How is this diagnosed? This condition may be diagnosed with various tests. Tests may include:  Blood tests.  Urine tests.  Imaging tests.  A test in which a sample of tissue is removed from the kidneys to be looked at under a microscope (kidney biopsy).  These test results will help your health care provider determine what class of CKD you have. How is this treated? Most cases of CKD cannot be cured. Treatment usually involves relieving symptoms and preventing or slowing the progression of the disease. Treatment may include:  A special diet, which may require you to avoid alcohol, salty foods (sodium), and foods that are high in potassium, calcium, and protein.  Medicines: ? To lower blood pressure. ? To relieve low blood count (anemia). ? To relieve swelling. ? To protect your bones. ? To improve the balance of electrolytes in your blood.  Removing toxic waste from the body by using hemodialysis or peritoneal dialysis if the kidneys can no longer do  their job (kidney failure).  Management of other conditions that are causing your CKD or making it worse.  Follow these instructions at home:  Follow your prescribed diet.  Take over-the-counter and prescription medicines only as told by your health care provider. ? Do not take any new medicines unless approved by your health care provider. Many medicines can worsen your kidney damage. ? Do not take any vitamin and mineral supplements unless approved by your health care provider. Many nutritional supplements can worsen your kidney damage. ? The dose of some medicines that you take may need to be adjusted.  Do not use any tobacco products, such as cigarettes, chewing tobacco, and e-cigarettes. If you need help quitting, ask your health care provider.  Keep all follow-up visits as told by your health care provider. This is important.  Keep track of your blood pressure. Report changes in your blood pressure as told by your health care provider.  Achieve and maintain a healthy weight. If you need help with this, ask your health care provider.  Start or continue an exercise plan. Try to exercise at least 30 minutes a day, 5 days a week.  Stay current with immunizations as told by your health care provider. Where to find more information:  American Association of Kidney Patients: BombTimer.gl  National Kidney Foundation: www.kidney.[place]: https://mathis.com/  [employer] [facility]: www.lifeoptions.org and www.kidneyschool.org Contact a health care provider if:  Your symptoms get worse.  You develop new symptoms. Get help right away if:  You develop symptoms of end-stage kidney disease, which include: ? Headaches. ? Abnormally dark or light skin. ? Numbness in the hands or feet. ? Easy bruising. ? Frequent hiccups. ? Chest pain. ? Shortness of breath. ? End of menstruation in women.  You have a fever.  You have decreased urine  production.  You have pain or bleeding when you urinate. This information is not intended to replace advice given to you by your health care provider. Make sure you discuss any questions you have with your health care provider. Document Released: 01/10/2008 Document Revised: 09/08/2015 Document Reviewed: 11/30/2011 Elsevier Interactive Patient Education  2017 [employer].

## 2016-11-24 LAB — CMP14+EGFR
ALT: 12 IU/L (ref 0–32)
AST: 20 IU/L (ref 0–40)
Albumin/Globulin Ratio: 1.6 (ref 1.2–2.2)
Albumin: 4.3 g/dL (ref 3.2–4.6)
Alkaline Phosphatase: 53 IU/L (ref 39–117)
BUN/Creatinine Ratio: 17 (ref 12–28)
BUN: 32 mg/dL (ref 10–36)
Bilirubin Total: 0.3 mg/dL (ref 0.0–1.2)
CALCIUM: 9.9 mg/dL (ref 8.7–10.3)
CO2: 27 mmol/L (ref 20–29)
Chloride: 99 mmol/L (ref 96–106)
Creatinine, Ser: 1.93 mg/dL — ABNORMAL HIGH (ref 0.57–1.00)
GFR, EST AFRICAN AMERICAN: 26 mL/min/{1.73_m2} — AB (ref >59)
GFR, EST NON AFRICAN AMERICAN: 22 mL/min/{1.73_m2} — AB (ref >59)
GLUCOSE: 80 mg/dL (ref 65–99)
Globulin, Total: 2.7 g/dL (ref 1.5–4.5)
Potassium: 4.1 mmol/L (ref 3.5–5.2)
Sodium: 140 mmol/L (ref 134–144)
TOTAL PROTEIN: 7 g/dL (ref 6.0–8.5)

## 2016-11-26 ENCOUNTER — Telehealth: Payer: Self-pay | Admitting: Family

## 2016-11-26 ENCOUNTER — Ambulatory Visit: Payer: Medicare Other | Admitting: Physician Assistant

## 2016-11-26 NOTE — Telephone Encounter (Signed)
Aware,  to stop lasix,(furosemide).

## 2016-12-19 ENCOUNTER — Ambulatory Visit (INDEPENDENT_AMBULATORY_CARE_PROVIDER_SITE_OTHER): Payer: Medicare Other | Admitting: Allergy and Immunology

## 2016-12-19 ENCOUNTER — Other Ambulatory Visit: Payer: Self-pay | Admitting: Family

## 2016-12-19 VITALS — BP 130/70 | HR 78 | Resp 16

## 2016-12-19 DIAGNOSIS — L500 Allergic urticaria: Secondary | ICD-10-CM | POA: Diagnosis not present

## 2016-12-19 DIAGNOSIS — L989 Disorder of the skin and subcutaneous tissue, unspecified: Secondary | ICD-10-CM

## 2016-12-19 DIAGNOSIS — L308 Other specified dermatitis: Secondary | ICD-10-CM | POA: Diagnosis not present

## 2016-12-19 DIAGNOSIS — J4530 Mild persistent asthma, uncomplicated: Secondary | ICD-10-CM | POA: Diagnosis not present

## 2016-12-19 DIAGNOSIS — K219 Gastro-esophageal reflux disease without esophagitis: Secondary | ICD-10-CM

## 2016-12-19 DIAGNOSIS — D649 Anemia, unspecified: Secondary | ICD-10-CM

## 2016-12-19 NOTE — Progress Notes (Signed)
Follow-up Note  Referring Provider: [name], [name] Primary Provider: [name], [name] Date of Office Visit: 12/19/2016  Subjective:   [name] (DOB: 03-Oct-1923) is a 83 y.o. female who returns to the [facility] on 12/19/2016 in re-evaluation of the following:  HPI: [name] returns to this clinic in reevaluation of her pruritic dermatitis, asthma, and reflux-induced respiratory disease. I last saw her in his clinic June 2018.  Her skin has done wonderful. She has very little itching and all the abnormalities on her skin have resolved. She is now using triamcinolone to her flanks almost every day. She does take an oatmeal bath and uses oatmeal soap.  She had no problems with her chest. She no longer gets "blocked up". She no longer needs to use a short acting bronchodilator.  She has not been having any reflux while utilizing her therapy. However, she has noticed a lot of gas throughout the daytime.  Allergies as of 12/19/2016      Reactions   Advicor [niacin-lovastatin Er] Other (See Comments)   unknown   Caduet [amlodipine-atorvastatin] Other (See Comments)   unknown   Codeine Other (See Comments)   unknown   Dilantin [phenytoin] Other (See Comments)   unknown   Lescol [fluvastatin] Other (See Comments)   Pamelor [nortriptyline] Other (See Comments)   unknown   Penicillins Other (See Comments)   unknown   Sulfa Antibiotics Other (See Comments)   unknown   Trileptal [oxcarbazepine] Other (See Comments)   unknown   Ultram [tramadol] Other (See Comments)   unknown   Welchol [colesevelam] Other (See Comments)   unknown      Medication List      ARNUITY ELLIPTA 200 MCG/ACT Aepb Generic drug:  Fluticasone Furoate Inhale 1 puff into the lungs daily.   aspirin 81 MG tablet Take 81 mg by mouth daily.   betamethasone dipropionate 0.05 % lotion Apply topically 2 (two) times daily.   cetirizine 10 MG tablet Commonly known as:   ZYRTEC Take 1 tablet (10 mg total) by mouth 2 (two) times daily.   diphenhydrAMINE 25 MG tablet Commonly known as:  BENADRYL Take 25 mg by mouth every morning.   escitalopram 5 MG tablet Commonly known as:  LEXAPRO TAKE 1 TABLET (5 MG TOTAL) BY MOUTH DAILY.   febuxostat 40 MG tablet Commonly known as:  ULORIC Take 1 tablet (40 mg total) by mouth daily.   ferrous sulfate 325 (65 FE) MG tablet Take 325 mg by mouth every Monday, Wednesday, and Friday.   furosemide 20 MG tablet Commonly known as:  LASIX Take 1 tablet (20 mg total) by mouth daily.   hydrOXYzine 50 MG tablet Commonly known as:  ATARAX/VISTARIL TAKE 1 TABLET (50 MG TOTAL) BY MOUTH EVERY 8 (EIGHT) HOURS AS NEEDED FOR ITCHING.   levETIRAcetam 500 MG 24 hr tablet Commonly known as:  KEPPRA XR Take 3 tablets (1,500 mg total) by mouth at bedtime.   metolazone 2.5 MG tablet Commonly known as:  ZAROXOLYN TAKE 1 TABLET ON MON, WED, AND FRIDAY   mometasone 0.1 % ointment Commonly known as:  ELOCON Apply to skin once daily as directed after bathing.   montelukast 10 MG tablet Commonly known as:  SINGULAIR Take 1 tablet (10 mg total) by mouth at bedtime.   nystatin-triamcinolone ointment Commonly known as:  MYCOLOG Apply 1 application topically 2 (two) times daily.   omeprazole 40 MG capsule Commonly known as:  PRILOSEC Take 1  capsule (40 mg total) by mouth daily.   ranitidine 150 MG tablet Commonly known as:  ZANTAC Take 1 tablet (150 mg total) by mouth 2 (two) times daily.       Past Medical History:  Diagnosis Date  . Chronic low back pain   . Degenerative arthritis   . Eczema   . Edema   . Gout   . Headache   . Hiatal hernia   . Hypertension   . Internal hemorrhoids   . Neurogenic claudication   . Obesity   . Seizure disorder ([place])   . Seizures ([place])   . Stroke ([place])   . Urticaria     Past Surgical History:  Procedure Laterality Date  . ABDOMINAL HYSTERECTOMY    . APPENDECTOMY    .  APPENDECTOMY    . bilateral knees    . cataracts    . CHOLECYSTECTOMY    . mammoplasty reduction    . NEUROPLASTY / TRANSPOSITION MEDIAN NERVE AT CARPAL TUNNEL BILATERAL    . RE-EXCISION OF BREAST LUMPECTOMY    . TUBAL LIGATION      Review of systems negative except as noted in HPI / PMHx or noted below:  Review of Systems  Constitutional: Negative.   HENT: Negative.   Eyes: Negative.   Respiratory: Negative.   Cardiovascular: Negative.   Gastrointestinal: Negative.   Genitourinary: Negative.   Musculoskeletal: Negative.   Skin: Negative.   Neurological: Negative.   Endo/Heme/Allergies: Negative.   Psychiatric/Behavioral: Negative.      Objective:   Vitals:   12/19/16 1107  BP: 130/70  Pulse: 78  Resp: 16  SpO2: 98%          Physical Exam  Constitutional: She is well-developed, well-nourished, and in no distress.  HENT:  Head: Normocephalic.  Right Ear: Tympanic membrane, external ear and ear canal normal.  Left Ear: Tympanic membrane, external ear and ear canal normal.  Nose: Nose normal. No mucosal edema or rhinorrhea.  Mouth/Throat: Uvula is midline, oropharynx is clear and moist and mucous membranes are normal. No oropharyngeal exudate.  Eyes: Conjunctivae are normal.  Neck: Trachea normal. No tracheal tenderness present. No tracheal deviation present. No thyromegaly present.  Cardiovascular: Normal rate, regular rhythm, S1 normal, S2 normal and normal heart sounds.   No murmur heard. Pulmonary/Chest: Breath sounds normal. No stridor. No respiratory distress. She has no wheezes. She has no rales.  Musculoskeletal: She exhibits no edema.  Lymphadenopathy:       Head (right side): No tonsillar adenopathy present.       Head (left side): No tonsillar adenopathy present.    She has no cervical adenopathy.  Neurological: She is alert. Gait normal.  Skin: Rash ( minimal hyperpigmented areas involving flanks bilaterally) noted. She is not diaphoretic. No  erythema. Nails show no clubbing.  Psychiatric: Mood and affect normal.    Diagnostics:    Spirometry was performed and demonstrated an FEV1 of 2.60 at 78  % of predicted.  The patient had an Asthma Control Test with the following results: ACT Total Score: 21.    Assessment and Plan:   1. Allergic urticaria   2. Inflammatory dermatosis   3. Asthma, well controlled, mild persistent   4. Gastroesophageal reflux disease, esophagitis presence not specified   5. Anemia, mild     1. Continue the following:   A. Arnuity 200 - one inhalation one time per day  B. Cetirizine 10mg  one tablet two times per day  C. Ranitidine  150 one tablet two times per day  D. Montelukast 10mg  one tablet one time per day  E. Shower followed by steroid ointment if needed  2. ONLY use albuterol nebulization or Proventil HFA IF NEEDED  3. DISCONTINUE Omeprazole.   4. Return to clinic in January 2019 or earlier if problem  5. Obtain fall flu vaccine  Mishelle appears to be doing very well regarding all her issues involving her skin and her respiratory tract. She is having lots of flatulence and we will now see if she does as well with her condition without the use of omeprazole and resolves her flatulence with this manipulation as well. If she does well I will see her back in this clinic in January 2019 or earlier if there is a problem.  Allena Katz, MD Allergy / Immunology [place]

## 2016-12-19 NOTE — Patient Instructions (Addendum)
  1. Continue the following:   A. Arnuity 200 - one inhalation one time per day  B. Cetirizine 10mg  one tablet two times per day  C. Ranitidine 150 one tablet two times per day  D. Montelukast 10mg  one tablet one time per day  E. Shower followed by steroid ointment if needed  2. ONLY use albuterol nebulization or Proventil HFA IF NEEDED - 3. DISCONTINUE Omeprazole.   4. Return to clinic in January 2019 or earlier if problem  5. Obtain fall flu vaccine

## 2016-12-20 ENCOUNTER — Encounter: Payer: Self-pay | Admitting: Allergy and Immunology

## 2016-12-20 ENCOUNTER — Ambulatory Visit (INDEPENDENT_AMBULATORY_CARE_PROVIDER_SITE_OTHER): Payer: Medicare Other | Admitting: Physician Assistant

## 2016-12-20 ENCOUNTER — Encounter: Payer: Self-pay | Admitting: Physician Assistant

## 2016-12-20 VITALS — BP 134/60 | HR 94 | Ht 66.0 in | Wt 230.8 lb

## 2016-12-20 DIAGNOSIS — I10 Essential (primary) hypertension: Secondary | ICD-10-CM | POA: Diagnosis not present

## 2016-12-20 DIAGNOSIS — R609 Edema, unspecified: Secondary | ICD-10-CM

## 2016-12-20 DIAGNOSIS — R079 Chest pain, unspecified: Secondary | ICD-10-CM | POA: Diagnosis not present

## 2016-12-20 DIAGNOSIS — N183 Chronic kidney disease, stage 3 unspecified: Secondary | ICD-10-CM

## 2016-12-20 NOTE — Patient Instructions (Signed)
Medication Instructions:  Your physician recommends that you continue on your current medications as directed. Please refer to the Current Medication list given to you today.   Labwork: -None  Testing/Procedures: -None  Follow-Up: Your physician wants you to follow-up in: 5 months with Dr. Radford Pax.  You will receive a reminder letter in the mail two months in advance. If you don't receive a letter, please call our office to schedule the follow-up appointment.   Any Other Special Instructions Will Be Listed Below (If Applicable).     If you need a refill on your cardiac medications before your next appointment, please call your pharmacy.  Low-Sodium Eating Plan Sodium, which is an element that makes up salt, helps you maintain a healthy balance of fluids in your body. Too much sodium can increase your blood pressure and cause fluid and waste to be held in your body. Your health care provider or dietitian may recommend following this plan if you have high blood pressure (hypertension), kidney disease, liver disease, or heart failure. Eating less sodium can help lower your blood pressure, reduce swelling, and protect your heart, liver, and kidneys. What are tips for following this plan? General guidelines  Most people on this plan should limit their sodium intake to 1,500-2,000 mg (milligrams) of sodium each day. Reading food labels  The Nutrition Facts label lists the amount of sodium in one serving of the food. If you eat more than one serving, you must multiply the listed amount of sodium by the number of servings.  Choose foods with less than 140 mg of sodium per serving.  Avoid foods with 300 mg of sodium or more per serving. Shopping  Look for lower-sodium products, often labeled as "low-sodium" or "no salt added."  Always check the sodium content even if foods are labeled as "unsalted" or "no salt added".  Buy fresh foods. ? Avoid canned foods and premade or frozen  meals. ? Avoid canned, cured, or processed meats  Buy breads that have less than 80 mg of sodium per slice. Cooking  Eat more home-cooked food and less restaurant, buffet, and fast food.  Avoid adding salt when cooking. Use salt-free seasonings or herbs instead of table salt or sea salt. Check with your health care provider or pharmacist before using salt substitutes.  Cook with plant-based oils, such as canola, sunflower, or olive oil. Meal planning  When eating at a restaurant, ask that your food be prepared with less salt or no salt, if possible.  Avoid foods that contain MSG (monosodium glutamate). MSG is sometimes added to [place] food, bouillon, and some canned foods. What foods are recommended? The items listed may not be a complete list. Talk with your dietitian about what dietary choices are best for you. Grains Low-sodium cereals, including oats, puffed wheat and rice, and shredded wheat. Low-sodium crackers. Unsalted rice. Unsalted pasta. Low-sodium bread. Whole-grain breads and whole-grain pasta. Vegetables Fresh or frozen vegetables. "No salt added" canned vegetables. "No salt added" tomato sauce and paste. Low-sodium or reduced-sodium tomato and vegetable juice. Fruits Fresh, frozen, or canned fruit. Fruit juice. Meats and other protein foods Fresh or frozen (no salt added) meat, poultry, seafood, and fish. Low-sodium canned tuna and salmon. Unsalted nuts. Dried peas, beans, and lentils without added salt. Unsalted canned beans. Eggs. Unsalted nut butters. Dairy Milk. Soy milk. Cheese that is naturally low in sodium, such as ricotta cheese, fresh mozzarella, or Swiss cheese Low-sodium or reduced-sodium cheese. Cream cheese. Yogurt. Fats and oils Unsalted butter.  Unsalted margarine with no trans fat. Vegetable oils such as canola or olive oils. Seasonings and other foods Fresh and dried herbs and spices. Salt-free seasonings. Low-sodium mustard and ketchup. Sodium-free  salad dressing. Sodium-free light mayonnaise. Fresh or refrigerated horseradish. Lemon juice. Vinegar. Homemade, reduced-sodium, or low-sodium soups. Unsalted popcorn and pretzels. Low-salt or salt-free chips. What foods are not recommended? The items listed may not be a complete list. Talk with your dietitian about what dietary choices are best for you. Grains Instant hot cereals. Bread stuffing, pancake, and biscuit mixes. Croutons. Seasoned rice or pasta mixes. Noodle soup cups. Boxed or frozen macaroni and cheese. Regular salted crackers. Self-rising flour. Vegetables Sauerkraut, pickled vegetables, and relishes. Olives. [place] fries. Onion rings. Regular canned vegetables (not low-sodium or reduced-sodium). Regular canned tomato sauce and paste (not low-sodium or reduced-sodium). Regular tomato and vegetable juice (not low-sodium or reduced-sodium). Frozen vegetables in sauces. Meats and other protein foods Meat or fish that is salted, canned, smoked, spiced, or pickled. Bacon, ham, sausage, hotdogs, corned beef, chipped beef, packaged lunch meats, salt pork, jerky, pickled herring, anchovies, regular canned tuna, sardines, salted nuts. Dairy Processed cheese and cheese spreads. Cheese curds. Blue cheese. Feta cheese. String cheese. Regular cottage cheese. Buttermilk. Canned milk. Fats and oils Salted butter. Regular margarine. Ghee. Bacon fat. Seasonings and other foods Onion salt, garlic salt, seasoned salt, table salt, and sea salt. Canned and packaged gravies. Worcestershire sauce. Tartar sauce. Barbecue sauce. Teriyaki sauce. Soy sauce, including reduced-sodium. Steak sauce. Fish sauce. Oyster sauce. Cocktail sauce. Horseradish that you find on the shelf. Regular ketchup and mustard. Meat flavorings and tenderizers. Bouillon cubes. Hot sauce and Tabasco sauce. Premade or packaged marinades. Premade or packaged taco seasonings. Relishes. Regular salad dressings. Salsa. Potato and tortilla  chips. Corn chips and puffs. Salted popcorn and pretzels. Canned or dried soups. Pizza. Frozen entrees and pot pies. Summary  Eating less sodium can help lower your blood pressure, reduce swelling, and protect your heart, liver, and kidneys.  Most people on this plan should limit their sodium intake to 1,500-2,000 mg (milligrams) of sodium each day.  Canned, boxed, and frozen foods are high in sodium. Restaurant foods, fast foods, and pizza are also very high in sodium. You also get sodium by adding salt to food.  Try to cook at home, eat more fresh fruits and vegetables, and eat less fast food, canned, processed, or prepared foods. This information is not intended to replace advice given to you by your health care provider. Make sure you discuss any questions you have with your health care provider. Document Released: 09/22/2001 Document Revised: 03/26/2016 Document Reviewed: 03/26/2016 Elsevier Interactive Patient Education  2017 [employer].

## 2016-12-20 NOTE — Progress Notes (Signed)
Cardiology Office Note    Date:  12/20/2016   ID:  [name], [name] Jan 21, 1924, MRN [id number]  PCP:  [name], [name]  Cardiologist: Dr. Radford Pax  Chief Complaint  Patient presents with  . Follow-up    History of Present Illness:  [name] is a 83 y.o. female has a PMH significant for HTN, CKD stage III, hx of obesity, seizure disorder, hx of stroke, and LEE. She presented to [name] with chest discomfort, nausea, and vomiting.  She was admitted to the [name] 11/14/16 with chest pain. Troponins were negative 3 and EKG without signs of ischemia. Chest pain was felt to be atypical and she is a very poor historian. She underwent stress testing that showed diffuse fixed perfusion defect at the apex septum and inferior lateral walls with global hypokinesis and apical dyskinesis EF 44% but no ischemia. 2-D echo showed normal LV function EF 55-60% with mild LVH. No significant wall motion abnormalities. Grade 1 DD.  Patient comes in today for post [name] follow-up accompanied by her daughter. Overall she is doing well. She denies any chest pain, palpitations, dyspnea, dyspnea on exertion, dizziness or presyncope. She does have some lower extremity edema. Her Lasix was stopped in the [name] because of worsening renal sufficiency and hypotension. She takes metolazone 3 times a week prescribed by Dr. Justin Mend. Last creatinine 11/23/16 was 1.93.       Past Medical History:  Diagnosis Date  . Chronic low back pain   . Degenerative arthritis   . Eczema   . Edema   . Gout   . Headache   . Hiatal hernia   . Hypertension   . Internal hemorrhoids   . Neurogenic claudication   . Obesity   . Seizure disorder ([name])   . Seizures ([name])   . Stroke ([place])   . Urticaria     Past Surgical History:  Procedure Laterality Date  . ABDOMINAL HYSTERECTOMY    . APPENDECTOMY    . APPENDECTOMY    . bilateral knees    . cataracts    . CHOLECYSTECTOMY    . mammoplasty reduction    .  NEUROPLASTY / TRANSPOSITION MEDIAN NERVE AT CARPAL TUNNEL BILATERAL    . RE-EXCISION OF BREAST LUMPECTOMY    . TUBAL LIGATION      Current Medications: Current Meds  Medication Sig  . ARNUITY ELLIPTA 200 MCG/ACT AEPB Inhale 1 puff into the lungs daily.  Marland Kitchen aspirin 81 MG tablet Take 81 mg by mouth daily.  . betamethasone dipropionate 0.05 % lotion Apply topically 2 (two) times daily.  . cetirizine (ZYRTEC) 10 MG tablet Take 1 tablet (10 mg total) by mouth 2 (two) times daily.  . diphenhydrAMINE (BENADRYL) 25 MG tablet Take 25 mg by mouth every morning.  . escitalopram (LEXAPRO) 5 MG tablet TAKE 1 TABLET (5 MG TOTAL) BY MOUTH DAILY.  . febuxostat (ULORIC) 40 MG tablet Take 1 tablet (40 mg total) by mouth daily.  . ferrous sulfate 325 (65 FE) MG tablet Take 325 mg by mouth every Monday, Wednesday, and Friday.   . furosemide (LASIX) 20 MG tablet Take 1 tablet (20 mg total) by mouth daily.  . hydrOXYzine (ATARAX/VISTARIL) 50 MG tablet TAKE 1 TABLET (50 MG TOTAL) BY MOUTH EVERY 8 (EIGHT) HOURS AS NEEDED FOR ITCHING.  . levETIRAcetam (KEPPRA XR) 500 MG 24 hr tablet Take 3 tablets (1,500 mg total) by mouth at bedtime.  . metolazone (ZAROXOLYN) 2.5 MG tablet TAKE 1 TABLET  ON MON, WED, AND FRIDAY  . mometasone (ELOCON) 0.1 % ointment Apply to skin once daily as directed after bathing.  . montelukast (SINGULAIR) 10 MG tablet Take 1 tablet (10 mg total) by mouth at bedtime.  Marland Kitchen nystatin-triamcinolone ointment (MYCOLOG) Apply 1 application topically 2 (two) times daily.  . ranitidine (ZANTAC) 150 MG tablet Take 1 tablet (150 mg total) by mouth 2 (two) times daily.     Allergies:   Advicor [niacin-lovastatin er]; Caduet [amlodipine-atorvastatin]; Codeine; Dilantin [phenytoin]; Lescol [fluvastatin]; Pamelor [nortriptyline]; Penicillins; Sulfa antibiotics; Trileptal [oxcarbazepine]; Ultram [tramadol]; and Welchol [colesevelam]   Social History   Social History  . Marital status: Widowed    Spouse name:  N/A  . Number of children: 4  . Years of education: N/A   Social History Main Topics  . Smoking status: Never Smoker  . Smokeless tobacco: Never Used  . Alcohol use No  . Drug use: No  . Sexual activity: Not Asked   Other Topics Concern  . None   Social History Narrative   Patient is right handed.   Patient drinks some caffeine daily.        Family History:  The patient's family history includes Cancer in her brother, sister, and sister; Diabetes in her mother; Heart disease in her father and mother.   ROS:   Please see the history of present illness.    Review of Systems  Constitution: Negative.  HENT: Negative.   Eyes: Negative.   Cardiovascular: Positive for leg swelling.  Respiratory: Negative.   Hematologic/Lymphatic: Negative.   Musculoskeletal: Positive for arthritis and joint swelling. Negative for joint pain.  Gastrointestinal: Negative.   Genitourinary: Negative.   Neurological: Negative.    All other systems reviewed and are negative.   PHYSICAL EXAM:   VS:  BP 134/60   Pulse 94   Ht 5\' 6"  (1.676 m)   Wt 230 lb 12.8 oz (104.7 kg)   SpO2 97%   BMI 37.25 kg/m   Physical Exam  GEN: Well nourished, well developed, in no acute distress  HEENT: normal  Neck: no JVD, carotid bruits, or masses Cardiac:RRR; no murmurs, rubs, or gallops  Respiratory:  clear to auscultation bilaterally, normal work of breathing GI: soft, nontender, nondistended, + BS Ext: without cyanosis, clubbing, or edema, Good distal pulses bilaterally MS: no deformity or atrophy  Skin: warm and dry, no rash Neuro:  Alert and Oriented x 3, Strength and sensation are intact Psych: euthymic mood, full affect  Wt Readings from Last 3 Encounters:  12/20/16 230 lb 12.8 oz (104.7 kg)  11/23/16 225 lb (102.1 kg)  11/15/16 224 lb (101.6 kg)      Studies/Labs Reviewed:   EKG:  EKG is not ordered today.    Recent Labs: 08/01/2016: TSH 1.740 11/15/2016: Hemoglobin 9.7; Platelets  133 11/23/2016: ALT 12; BUN 32; Creatinine, Ser 1.93; Potassium 4.1; Sodium 140   Lipid Panel    Component Value Date/Time   CHOL 172 08/04/2015 1124   TRIG 255 (H) 08/04/2015 1124   TRIG 215 (H) 01/11/2014 0903   HDL 38 (L) 08/04/2015 1124   HDL 42 01/11/2014 0903   CHOLHDL 4.5 (H) 08/04/2015 1124   LDLCALC 83 08/04/2015 1124   [place] 95 01/11/2014 0903    Additional studies/ records that were reviewed today include:   Myoview 11/14/16  IMPRESSION: 1. Diffuse fixed perfusion defect of the apex, septum and inferolateral walls.   No significant reversible ischemia with pharmacologic stress within the limits of the  study.   2. Global hypokinesis with apical dyskinesis.   3. Left ventricular ejection fraction 54%   4. Non invasive risk stratification*: Intermediate     Echo result: LV EF: 55% -   60%   Study Conclusions   - Left ventricle: The cavity size was normal. Wall thickness was   increased in a pattern of mild LVH. Systolic function was normal.   The estimated ejection fraction was in the range of 55% to 60%.   Although no diagnostic regional wall motion abnormality was   identified, this possibility cannot be completely excluded on the   basis of this study. Doppler parameters are consistent with   abnormal left ventricular relaxation (grade 1 diastolic   dysfunction). - Aortic valve: There was no stenosis. - Mitral valve: There was trivial regurgitation. - Left atrium: The atrium was moderately dilated. - Right ventricle: The cavity size was normal. Systolic function   was normal. - Right atrium: The atrium was mildly dilated. - Tricuspid valve: Peak RV-RA gradient (S): 26 mm Hg. - Pulmonary arteries: PA peak pressure: 29 mm Hg (S). - Inferior vena cava: The vessel was normal in size. The   respirophasic diameter changes were in the normal range (>= 50%),   consistent with normal central venous pressure.   Impressions:   - Normal LV size with mild LV  hypertrophy. EF 55-60%. Normal RV   size and systolic function. Biatrial enlargement. No significant   valvular abnormalities.   ASSESSMENT:    1. Chest pain, unspecified type   2. Essential hypertension, benign   3. Chronic kidney disease, stage III (moderate)   4. Edema, unspecified type      PLAN:  In order of problems listed above:  Chest pain felt to be atypical resolved. Intermediate risk Myoview because of fixed defect in the am. Walls EF 54% no ischemia. Follow-up echo normal LV size and function EF 55-60% with mild LVH. No wall motion abnormalities. Grade 1 DD. Follow-up with Dr. Radford Pax in 4-5 months.  Essential hypertension blood pressure controlled  CKD stage III followed closely by Dr. Justin Mend. Recommend follow-up with him with repeat labs.  Edema lower extremity patient has mild ankle edema today. She did get extra sodium in her diet. Reiterate 2 g sodium diet. She is on metolazone 3 times weekly prescribed by Dr. Justin Mend and Lasix was stopped in the [name] because of worsening renal function and hypotension. Follow-up with Dr. Justin Mend.    Medication Adjustments/Labs and Tests Ordered: Current medicines are reviewed at length with the patient today.  Concerns regarding medicines are outlined above.  Medication changes, Labs and Tests ordered today are listed in the Patient Instructions below. There are no Patient Instructions on file for this visit.   Sumner Boast, PA-C  12/20/2016 11:20 AM    [facility] [address], [place], [place]  [address] Phone: [phone]; Fax: [phone]

## 2017-01-16 ENCOUNTER — Other Ambulatory Visit: Payer: Self-pay | Admitting: Allergy and Immunology

## 2017-01-26 ENCOUNTER — Other Ambulatory Visit: Payer: Self-pay | Admitting: Allergy and Immunology

## 2017-02-01 ENCOUNTER — Other Ambulatory Visit: Payer: Self-pay | Admitting: Family

## 2017-02-02 ENCOUNTER — Other Ambulatory Visit: Payer: Self-pay | Admitting: Allergy and Immunology

## 2017-02-12 ENCOUNTER — Encounter: Payer: Self-pay | Admitting: Family

## 2017-02-12 ENCOUNTER — Ambulatory Visit (INDEPENDENT_AMBULATORY_CARE_PROVIDER_SITE_OTHER): Payer: Medicare Other | Admitting: Family

## 2017-02-12 VITALS — BP 126/75 | HR 87 | Temp 97.4°F | Ht 66.0 in | Wt 234.0 lb

## 2017-02-12 DIAGNOSIS — J45909 Unspecified asthma, uncomplicated: Secondary | ICD-10-CM | POA: Insufficient documentation

## 2017-02-12 DIAGNOSIS — R609 Edema, unspecified: Secondary | ICD-10-CM

## 2017-02-12 DIAGNOSIS — I10 Essential (primary) hypertension: Secondary | ICD-10-CM | POA: Diagnosis not present

## 2017-02-12 DIAGNOSIS — J4530 Mild persistent asthma, uncomplicated: Secondary | ICD-10-CM

## 2017-02-12 DIAGNOSIS — N184 Chronic kidney disease, stage 4 (severe): Secondary | ICD-10-CM

## 2017-02-12 NOTE — Patient Instructions (Signed)

## 2017-02-12 NOTE — Progress Notes (Signed)
   Subjective:    Patient ID: [name], female    DOB: 12/18/1923, 83 y.o.   MRN: [id number]  Pt presents to the office today for chronic follow up. Pt saw Cardiologist on 12/20/16 for HTN, chest pain, and edema. Pt had ECHO of 54%. Pt is followed by Nephrologists CKD stage 4 annually.  Hypertension  This is a chronic problem. The current episode started more than 1 year ago. The problem has been resolved since onset. The problem is controlled. Associated symptoms include peripheral edema ("at times"). Pertinent negatives include no blurred vision, headaches, malaise/fatigue or shortness of breath. The current treatment provides moderate improvement. Hypertensive end-organ damage includes kidney disease and CVA.  Asthma  There is no cough, shortness of breath or wheezing. This is a chronic problem. The current episode started more than 1 year ago. The problem has been resolved. Pertinent negatives include no fever, headaches, malaise/fatigue, rhinorrhea or trouble swallowing. Her past medical history is significant for asthma.  Peripheral Edema Complaining of intermittent swelling of ankles. Taking Metolazone Mon, Wed, and Friday.     Review of Systems  Constitutional: Negative for fever and malaise/fatigue.  HENT: Negative for rhinorrhea and trouble swallowing.   Eyes: Negative for blurred vision.  Respiratory: Negative for cough, shortness of breath and wheezing.   Neurological: Negative for headaches.  All other systems reviewed and are negative.      Objective:   Physical Exam  Constitutional: She is oriented to person, place, and time. She appears well-developed and well-nourished. No distress.  Morbidly obese   HENT:  Head: Normocephalic and atraumatic.  Right Ear: External ear normal.  Left Ear: External ear normal.  Nose: Nose normal.  Mouth/Throat: Oropharynx is clear and moist.  Eyes: Pupils are equal, round, and reactive to light.  Neck: Normal range of motion.  Neck supple. No thyromegaly present.  Cardiovascular: Normal rate, regular rhythm, normal heart sounds and intact distal pulses.   No murmur heard. Pulmonary/Chest: Effort normal and breath sounds normal. No respiratory distress. She has no wheezes.  Abdominal: Soft. Bowel sounds are normal. She exhibits no distension. There is no tenderness.  Musculoskeletal: Normal range of motion. She exhibits no edema (none noted) or tenderness.  Neurological: She is alert and oriented to person, place, and time. She has normal reflexes. No cranial nerve deficit.  Skin: Skin is warm and dry.  Psychiatric: She has a normal mood and affect. Her behavior is normal. Judgment and thought content normal.  Vitals reviewed.     BP 126/75   Pulse 87   Temp (!) 97.4 F (36.3 C) (Oral)   Ht '5\' 6"'$  (1.676 m)   Wt 234 lb (106.1 kg)   BMI 37.77 kg/m      Assessment & Plan:  1. Essential hypertension, benign - BMP8+EGFR  2. Chronic kidney disease (CKD), stage IV (severe) (HCC) - BMP8+EGFR  3. Morbid obesity ([place]) - BMP8+EGFR  4. Edema, unspecified type - BMP8+EGFR  5. Mild persistent asthma without complication   Continue all meds and keep all specialists appts Labs pending Health Maintenance reviewed Diet and exercise encouraged RTO 6 months   [name], [name]

## 2017-02-13 LAB — BMP8+EGFR
BUN / CREAT RATIO: 18 (ref 12–28)
BUN: 31 mg/dL (ref 10–36)
CHLORIDE: 105 mmol/L (ref 96–106)
CO2: 26 mmol/L (ref 20–29)
Calcium: 9.9 mg/dL (ref 8.7–10.3)
Creatinine, Ser: 1.71 mg/dL — ABNORMAL HIGH (ref 0.57–1.00)
GFR calc Af Amer: 30 mL/min/{1.73_m2} — ABNORMAL LOW (ref >59)
GFR calc non Af Amer: 26 mL/min/{1.73_m2} — ABNORMAL LOW (ref >59)
GLUCOSE: 98 mg/dL (ref 65–99)
Potassium: 4.5 mmol/L (ref 3.5–5.2)
SODIUM: 142 mmol/L (ref 134–144)

## 2017-03-15 ENCOUNTER — Other Ambulatory Visit: Payer: Self-pay

## 2017-03-15 MED ORDER — METOLAZONE 2.5 MG PO TABS: ORAL_TABLET | ORAL | 0 refills | Status: DC

## 2017-03-16 ENCOUNTER — Other Ambulatory Visit: Payer: Self-pay | Admitting: Family

## 2017-03-17 ENCOUNTER — Other Ambulatory Visit: Payer: Self-pay | Admitting: Allergy and Immunology

## 2017-03-19 ENCOUNTER — Other Ambulatory Visit: Payer: Self-pay | Admitting: *Deleted

## 2017-03-19 NOTE — Telephone Encounter (Signed)
Closing encounter This was sent on 03/15/17 for #45 which is a 90d supply

## 2017-04-01 ENCOUNTER — Other Ambulatory Visit: Payer: Self-pay | Admitting: Allergy and Immunology

## 2017-04-12 ENCOUNTER — Other Ambulatory Visit: Payer: Self-pay | Admitting: Family

## 2017-04-19 ENCOUNTER — Other Ambulatory Visit: Payer: Self-pay

## 2017-04-19 DIAGNOSIS — F411 Generalized anxiety disorder: Secondary | ICD-10-CM

## 2017-04-19 MED ORDER — ESCITALOPRAM OXALATE 5 MG PO TABS: 5.0000 mg | ORAL_TABLET | Freq: Every day | ORAL | 0 refills | Status: DC

## 2017-05-17 ENCOUNTER — Encounter: Payer: Self-pay | Admitting: Family

## 2017-05-17 ENCOUNTER — Ambulatory Visit: Payer: Medicare Other | Admitting: Family

## 2017-05-17 VITALS — BP 136/83 | HR 80 | Temp 97.3°F | Ht 66.0 in | Wt 239.0 lb

## 2017-05-17 DIAGNOSIS — I10 Essential (primary) hypertension: Secondary | ICD-10-CM

## 2017-05-17 DIAGNOSIS — R066 Hiccough: Secondary | ICD-10-CM | POA: Diagnosis not present

## 2017-05-17 NOTE — Patient Instructions (Signed)
Hiccups A hiccup is the result of a sudden shortening of the muscle below your lungs (diaphragm). This movement of your diaphragm causes a sudden inhalation followed by the closing of your vocal cords, which causes the hiccup sound. Most people get the hiccups. Typically, hiccups last only a short amount of time. There are three types of hiccups:  Benign. These hiccups last less than 48 hours.  Persistent. These hiccups last more than 48 hours, but less than 1 month.  Intractable. These hiccups last more than 1 month.  A hiccup is a reflex. You cannot control reflexes. Follow these instructions at home: Watch your hiccups for any changes. The following actions may help to lessen any discomfort that you are feeling:  Eat small meals.  Limit alcohol intake to no more than 1 drink per day for nonpregnant women and 2 drinks per day for men. One drink equals 12 oz of beer, 5 oz of wine, or 1 oz of hard liquor.  Limit drinking carbonated or fizzy drinks, such as soda.  Eat and chew your food slowly.  Avoid eating or drinking hot or spicy foods and drinks.  Take medicines only as directed by your health care provider.  Contact a health care provider if:  Your hiccups last for more than 48 hours.  Your hiccups do not improve with treatment.  You cannot sleep or eat due to the hiccups.  You have unexpected weight loss due to the hiccups.  You have a fever.  You have trouble breathing or swallowing.  You develop severe pain in your abdomen.  You develop numbness, tingling, or weakness. This information is not intended to replace advice given to you by your health care provider. Make sure you discuss any questions you have with your health care provider. Document Released: 06/11/2001 Document Revised: 09/08/2015 Document Reviewed: 03/29/2014 Elsevier Interactive Patient Education  [employer].

## 2017-05-17 NOTE — Progress Notes (Signed)
   Subjective:    Patient ID: [name], female    DOB: 04-07-1924, 84 y.o.   MRN: [id number]    Pt presents to the office today with elevated blood pressure readings at home. Reports she has been taken off her BP medications because of her kidney function. Pt's BP at home has been averaging 150's/80's.   Pt complaining of hiccups in the morning that started recently. Pt reports drinking Ginger Ale.  Hypertension  This is a chronic problem. The current episode started more than 1 year ago. The problem has been waxing and waning since onset. Associated symptoms include headaches, malaise/fatigue and peripheral edema. Pertinent negatives include no shortness of breath.      Review of Systems  Constitutional: Positive for malaise/fatigue.  Respiratory: Negative for shortness of breath.   Neurological: Positive for headaches.  All other systems reviewed and are negative.      Objective:   Physical Exam  Constitutional: She is oriented to person, place, and time. She appears well-developed and well-nourished. No distress.  HENT:  Head: Normocephalic and atraumatic.  Eyes: Pupils are equal, round, and reactive to light.  Neck: Normal range of motion. Neck supple. No thyromegaly present.  Cardiovascular: Normal rate, regular rhythm, normal heart sounds and intact distal pulses.  No murmur heard. Pulmonary/Chest: Effort normal. No respiratory distress. She has no wheezes.  Abdominal: Soft. She exhibits no distension. There is no tenderness.  Musculoskeletal: Normal range of motion. She exhibits edema (trace BLE). She exhibits no tenderness.  Neurological: She is alert and oriented to person, place, and time.  Skin: Skin is warm and dry.  Psychiatric: She has a normal mood and affect. Her behavior is normal. Judgment and thought content normal.  Vitals reviewed.     BP 136/83   Pulse 80   Temp (!) 97.3 F (36.3 C) (Oral)   Ht 5' 6" (1.676 m)   Wt 239 lb (108.4 kg)   BMI  38.58 kg/m      Assessment & Plan:  1. Essential hypertension BP is at goal today Discussed only taking BP once a day Labs pending Low salt diet - BMP8+EGFR  2. Hiccups Could be from sodas or chewing gum at night? I think this is benign   - BMP8+EGFR    cancer

## 2017-05-18 LAB — BMP8+EGFR
BUN / CREAT RATIO: 18 (ref 12–28)
BUN: 31 mg/dL (ref 10–36)
CO2: 27 mmol/L (ref 20–29)
CREATININE: 1.71 mg/dL — AB (ref 0.57–1.00)
Calcium: 10 mg/dL (ref 8.7–10.3)
Chloride: 100 mmol/L (ref 96–106)
GFR calc Af Amer: 30 mL/min/{1.73_m2} — ABNORMAL LOW (ref >59)
GFR, EST NON AFRICAN AMERICAN: 26 mL/min/{1.73_m2} — AB (ref >59)
Glucose: 92 mg/dL (ref 65–99)
Potassium: 4.8 mmol/L (ref 3.5–5.2)
Sodium: 142 mmol/L (ref 134–144)

## 2017-05-30 ENCOUNTER — Encounter: Payer: Self-pay | Admitting: Cardiology

## 2017-06-07 ENCOUNTER — Ambulatory Visit: Payer: Medicare Other | Admitting: Cardiology

## 2017-06-07 ENCOUNTER — Encounter: Payer: Self-pay | Admitting: Cardiology

## 2017-06-07 VITALS — BP 124/82 | HR 94 | Wt 243.4 lb

## 2017-06-07 DIAGNOSIS — R079 Chest pain, unspecified: Secondary | ICD-10-CM

## 2017-06-07 DIAGNOSIS — I10 Essential (primary) hypertension: Secondary | ICD-10-CM

## 2017-06-07 DIAGNOSIS — N184 Chronic kidney disease, stage 4 (severe): Secondary | ICD-10-CM

## 2017-06-07 NOTE — Patient Instructions (Signed)
Medication Instructions:  Your physician recommends that you continue on your current medications as directed. Please refer to the Current Medication list given to you today.  If you need a refill on your cardiac medications, please contact your pharmacy first.  Labwork: None ordered   Testing/Procedures: None ordered   Follow-Up: Your physician wants you to follow-up as needed with Dr. Radford Pax.   Any Other Special Instructions Will Be Listed Below (If Applicable).   Thank you for choosing [facility], RN  [phone]  If you need a refill on your cardiac medications before your next appointment, please call your pharmacy.

## 2017-06-07 NOTE — Progress Notes (Signed)
Cardiology Office Note:    Date:  06/07/2017   ID:  [name], DOB [birth year], MRN [id number]  PCP:  [name], [name]  Cardiologist:  No primary care provider on file.    Referring MD: [name], [name]   Chief Complaint  Patient presents with  . Chest Pain  . Hypertension    History of Present Illness:    [name] is a 84 y.o. female with a hx of HTN, CKD stage III, hx of obesity, seizure disorder, hx of stroke, and LEE.  She was admitted to the hospital 11/14/16 with chest pain. Troponins were negative 3 and EKG without signs of ischemia. Chest pain was felt to be atypical and she is a very poor historian. She underwent stress testing that showed diffuse fixed perfusion defect at the apex septum and inferior lateral walls with global hypokinesis and apical dyskinesis EF 44% but no ischemia. 2-D echo showed normal LV function EF 55-60% with mild LVH. No significant wall motion abnormalities. Grade 1 DD.  Given her advanced age and comorbidities medical management was recommended.    She is here today for followup and is doing well.  She has problems with indigestion.  She denies any anginal chest pain or pressure, PND, orthopnea,  dizziness, palpitations or syncope. She has chronic DOE which she attributes to her very sedentary state.  She has chronic LE edema which is stable.  She is compliant with her meds and is tolerating meds with no SE.     Past Medical History:  Diagnosis Date  . Chronic low back pain   . Degenerative arthritis   . Eczema   . Edema   . Gout   . Headache   . Hiatal hernia   . Hypertension   . Internal hemorrhoids   . Neurogenic claudication   . Obesity   . Seizure disorder ([place])   . Seizures ([place])   . Stroke ([place])   . Urticaria     Past Surgical History:  Procedure Laterality Date  . ABDOMINAL HYSTERECTOMY    . APPENDECTOMY    . APPENDECTOMY    . bilateral knees    . cataracts    . CHOLECYSTECTOMY    . mammoplasty reduction     . NEUROPLASTY / TRANSPOSITION MEDIAN NERVE AT CARPAL TUNNEL BILATERAL    . RE-EXCISION OF BREAST LUMPECTOMY    . TUBAL LIGATION      Current Medications: Current Meds  Medication Sig  . ARNUITY ELLIPTA 200 MCG/ACT AEPB INHALE 1 DOSE INTO THE LUNGS DAILY. RINSE, GARGLE, AND SPIT AFTER USE.  Marland Kitchen aspirin 81 MG tablet Take 81 mg by mouth daily.  . betamethasone dipropionate 0.05 % lotion Apply topically 2 (two) times daily.  . cetirizine (ZYRTEC) 10 MG tablet TAKE 1 TABLET (10 MG TOTAL) BY MOUTH 2 (TWO) TIMES DAILY.  . diphenhydrAMINE (BENADRYL) 25 MG tablet Take 25 mg by mouth every morning.  . escitalopram (LEXAPRO) 5 MG tablet Take 1 tablet (5 mg total) by mouth daily.  . febuxostat (ULORIC) 40 MG tablet Take 1 tablet (40 mg total) by mouth daily.  . ferrous sulfate 325 (65 FE) MG tablet Take 325 mg by mouth every Monday, Wednesday, and Friday.   . hydrOXYzine (ATARAX/VISTARIL) 50 MG tablet TAKE 1 TABLET (50 MG TOTAL) BY MOUTH EVERY 8 (EIGHT) HOURS AS NEEDED FOR ITCHING.  . levETIRAcetam (KEPPRA XR) 500 MG 24 hr tablet Take 3 tablets (1,500 mg total) by mouth at bedtime.  Marland Kitchen  metolazone (ZAROXOLYN) 2.5 MG tablet TAKE 1 TABLET ON MON, WED, AND FRIDAY  . mometasone (ELOCON) 0.1 % ointment Apply to skin once daily as directed after bathing.  . montelukast (SINGULAIR) 10 MG tablet TAKE 1 TABLET BY MOUTH EVERYDAY AT BEDTIME  . nystatin-triamcinolone ointment (MYCOLOG) Apply 1 application topically 2 (two) times daily.  Marland Kitchen omeprazole (PRILOSEC) 40 MG capsule TAKE 1 CAPSULE BY MOUTH EVERY DAY  . ranitidine (ZANTAC) 150 MG tablet TAKE 1 TABLET (150 MG TOTAL) BY MOUTH 2 (TWO) TIMES DAILY.     Allergies:   Advicor [niacin-lovastatin er]; Caduet [amlodipine-atorvastatin]; Codeine; Dilantin [phenytoin]; Lescol [fluvastatin]; Pamelor [nortriptyline]; Penicillins; Sulfa antibiotics; Trileptal [oxcarbazepine]; Ultram [tramadol]; and Welchol [colesevelam]   Social History   Socioeconomic History  .  Marital status: Widowed    Spouse name: None  . Number of children: 4  . Years of education: None  . Highest education level: None  Social Needs  . Financial resource strain: None  . Food insecurity - worry: None  . Food insecurity - inability: None  . Transportation needs - medical: None  . Transportation needs - non-medical: None  Occupational History  . None  Tobacco Use  . Smoking status: Never Smoker  . Smokeless tobacco: Never Used  Substance and Sexual Activity  . Alcohol use: No  . Drug use: No  . Sexual activity: None  Other Topics Concern  . None  Social History Narrative   Patient is right handed.   Patient drinks some caffeine daily.     Family History: The patient's family history includes Cancer in her brother, sister, and sister; Diabetes in her mother; Heart disease in her father and mother.  ROS:   Please see the history of present illness.    ROS  All other systems reviewed and negative.   EKGs/Labs/Other Studies Reviewed:    The following studies were reviewed today: none  EKG:  EKG is not ordered today.    Recent Labs: 08/01/2016: TSH 1.740 11/15/2016: Hemoglobin 9.7; Platelets 133 11/23/2016: ALT 12 05/17/2017: BUN 31; Creatinine, Ser 1.71; Potassium 4.8; Sodium 142   Recent Lipid Panel    Component Value Date/Time   CHOL 172 08/04/2015 1124   TRIG 255 (H) 08/04/2015 1124   TRIG 215 (H) 01/11/2014 0903   HDL 38 (L) 08/04/2015 1124   HDL 42 01/11/2014 0903   CHOLHDL 4.5 (H) 08/04/2015 1124   LDLCALC 83 08/04/2015 1124   LDLCALC 95 01/11/2014 0903    Physical Exam:    VS:  BP 124/82   Pulse 94   Wt 243 lb 6.4 oz (110.4 kg)   SpO2 98%   BMI 39.29 kg/m     Wt Readings from Last 3 Encounters:  06/07/17 243 lb 6.4 oz (110.4 kg)  05/17/17 239 lb (108.4 kg)  02/12/17 234 lb (106.1 kg)     GEN:  Well nourished, well developed in no acute distress HEENT: Normal NECK: No JVD; No carotid bruits LYMPHATICS: No lymphadenopathy CARDIAC:  RRR, no murmurs, rubs, gallops RESPIRATORY:  Clear to auscultation without rales, wheezing or rhonchi  ABDOMEN: Soft, non-tender, non-distended MUSCULOSKELETAL:  Trace edema; No deformity  SKIN: Warm and dry NEUROLOGIC:  Alert and oriented x 3 PSYCHIATRIC:  Normal affect   ASSESSMENT:    1. Chest pain, unspecified type   2. Essential hypertension, benign   3. Chronic kidney disease (CKD), stage IV (severe) (HCC)    PLAN:    In order of problems listed above:  1.  Chest  pain -she has not had any further episodes of chest pain.  Nuclear stress test done last year showed no inducible ischemia.  2D echo cardio gram showed normal LV function.  Her chest pain was felt to be atypical and no further cardiac workup was recommended after nuclear stress test showed no ischemia given her advanced age and comorbidities including chronic kidney disease stage IV.  She will continue aspirin 81 mg daily.  2.  Hypertension -blood pressures well controlled on exam today.  She is not on any antihypertensive medications at this time.  3.  Chronic kidney disease stage IV-this is followed by her nephrologist.3.    I recommended that she follow-up on a as needed basis given that she has not had any further chest pain and nuclear stress test showed no ischemia.   Medication Adjustments/Labs and Tests Ordered: Current medicines are reviewed at length with the patient today.  Concerns regarding medicines are outlined above.  No orders of the defined types were placed in this encounter.  No orders of the defined types were placed in this encounter.   Signed, Fransico Him, MD  06/07/2017 10:32 AM    [place]

## 2017-06-09 ENCOUNTER — Other Ambulatory Visit: Payer: Self-pay | Admitting: Allergy and Immunology

## 2017-06-10 ENCOUNTER — Other Ambulatory Visit: Payer: Self-pay

## 2017-06-10 NOTE — Telephone Encounter (Signed)
Received fax for 90 day supply of Omeprazole. Patient was last seen 12/19/2016. Patient was to return in 6 months. Patient needs office visit.

## 2017-06-14 ENCOUNTER — Other Ambulatory Visit: Payer: Self-pay | Admitting: Allergy and Immunology

## 2017-06-16 ENCOUNTER — Other Ambulatory Visit: Payer: Self-pay | Admitting: Adult Health

## 2017-07-09 ENCOUNTER — Other Ambulatory Visit: Payer: Self-pay

## 2017-07-10 ENCOUNTER — Ambulatory Visit: Payer: Medicare Other | Admitting: Adult Health

## 2017-07-10 ENCOUNTER — Encounter: Payer: Self-pay | Admitting: Adult Health

## 2017-07-10 VITALS — BP 148/94 | HR 72 | Wt 245.0 lb

## 2017-07-10 DIAGNOSIS — R569 Unspecified convulsions: Secondary | ICD-10-CM

## 2017-07-10 NOTE — Progress Notes (Signed)
I have read the note, and I agree with the clinical assessment and plan.  Gilverto Dileonardo K Carleta Woodrow   

## 2017-07-10 NOTE — Patient Instructions (Signed)
Your Plan:  Continue Keppra XR If you have any seizures please let us know If your symptoms worsen or you develop new symptoms please let us know.    Thank you for coming to see [place] at [facility]. I hope we have been able to provide you high quality care today.  You may receive a patient satisfaction survey over the next few weeks. We would appreciate your feedback and comments so that we may continue to improve ourselves and the health of our patients.

## 2017-07-10 NOTE — Progress Notes (Signed)
PATIENT: [name] DOB: October 28, 1923  REASON FOR VISIT: follow up HISTORY FROM: patient  HISTORY OF PRESENT ILLNESS: Today 07/10/17  [name] is a 84 year old female with a history of partial complex type seizures.  She returns today for follow-up.  She remains on Keppra extended release 1500 mg at bedtime.  She denies any seizure events.  She lives at home alone.  Is able to complete most ADLs independently.  She does require some supervision when taking a shower.  She does require help with housework.  She does not operate a motor vehicle.  Overall she reports that she is doing well.  She returns today for evaluation.  HISTORY 07/10/16: [name] is a 84 year old female with a history of partial complex type seizures. She returns today for follow-up. At the last visit she was switched to Keppra extended release 1500 mg daily. It was intended that the patient also stay on phenobarbital however she states that she was confused and stopped the phenobarbital and  only been on Keppra for the last year. The patient has not had any seizures. She has done well just on Keppra. She does not operate a motor vehicle. Her daughter helps her with her medications. She states her main concern today is itching and a rash that has persisted over the last year. She states that she did see her primary care and she thought they were referring her to a dermatologist. She returns today for an evaluation.    REVIEW OF SYSTEMS: Out of a complete 14 system review of symptoms, the patient complains only of the following symptoms, and all other reviewed systems are negative.  See HPI  ALLERGIES: Allergies  Allergen Reactions  . Advicor [Niacin-Lovastatin Er] Other (See Comments)    unknown  . Caduet [Amlodipine-Atorvastatin] Other (See Comments)    unknown  . Codeine Other (See Comments)    unknown  . Dilantin [Phenytoin] Other (See Comments)    unknown  . Lescol [Fluvastatin] Other (See Comments)  .  Pamelor [Nortriptyline] Other (See Comments)    unknown  . Penicillins Other (See Comments)    unknown  . Sulfa Antibiotics Other (See Comments)    unknown  . Trileptal [Oxcarbazepine] Other (See Comments)    unknown  . Ultram [Tramadol] Other (See Comments)    unknown  . Welchol [Colesevelam] Other (See Comments)    unknown    HOME MEDICATIONS: Outpatient Medications Prior to Visit  Medication Sig Dispense Refill  . ARNUITY ELLIPTA 200 MCG/ACT AEPB INHALE 1 DOSE INTO THE LUNGS DAILY. RINSE, GARGLE, AND SPIT AFTER USE. 30 each 0  . aspirin 81 MG tablet Take 81 mg by mouth daily.    . betamethasone dipropionate 0.05 % lotion Apply topically 2 (two) times daily. 120 mL 0  . cetirizine (ZYRTEC) 10 MG tablet TAKE 1 TABLET (10 MG TOTAL) BY MOUTH 2 (TWO) TIMES DAILY. 60 tablet 5  . diphenhydrAMINE (BENADRYL) 25 MG tablet Take 25 mg by mouth every morning.    . escitalopram (LEXAPRO) 5 MG tablet Take 1 tablet (5 mg total) by mouth daily. 90 tablet 0  . febuxostat (ULORIC) 40 MG tablet Take 1 tablet (40 mg total) by mouth daily. 90 tablet 1  . ferrous sulfate 325 (65 FE) MG tablet Take 325 mg by mouth every Monday, Wednesday, and Friday.     . hydrOXYzine (ATARAX/VISTARIL) 50 MG tablet TAKE 1 TABLET (50 MG TOTAL) BY MOUTH EVERY 8 (EIGHT) HOURS AS NEEDED FOR ITCHING. [place]  tablet 2  . levETIRAcetam (KEPPRA XR) 500 MG 24 hr tablet TAKE 3 TABLETS (1,500 MG TOTAL) BY MOUTH AT BEDTIME. 90 tablet 11  . metolazone (ZAROXOLYN) 2.5 MG tablet TAKE 1 TABLET ON MON, WED, AND FRIDAY 45 tablet 0  . mometasone (ELOCON) 0.1 % ointment Apply to skin once daily as directed after bathing. 90 g 3  . montelukast (SINGULAIR) 10 MG tablet TAKE 1 TABLET BY MOUTH EVERYDAY AT BEDTIME 30 tablet 2  . nystatin-triamcinolone ointment (MYCOLOG) Apply 1 application topically 2 (two) times daily. 90 g 1  . omeprazole (PRILOSEC) 40 MG capsule TAKE 1 CAPSULE BY MOUTH EVERY DAY 30 capsule 5  . ranitidine (ZANTAC) 150 MG tablet  TAKE 1 TABLET (150 MG TOTAL) BY MOUTH 2 (TWO) TIMES DAILY. 60 tablet 1   No facility-administered medications prior to visit.     PAST MEDICAL HISTORY: Past Medical History:  Diagnosis Date  . Chronic low back pain   . Degenerative arthritis   . Eczema   . Edema   . Gout   . Headache   . Hiatal hernia   . Hypertension   . Internal hemorrhoids   . Neurogenic claudication   . Obesity   . Seizure disorder ([place])   . Seizures ([place])   . Stroke ([place])   . Urticaria     PAST SURGICAL HISTORY: Past Surgical History:  Procedure Laterality Date  . ABDOMINAL HYSTERECTOMY    . APPENDECTOMY    . APPENDECTOMY    . bilateral knees    . cataracts    . CHOLECYSTECTOMY    . mammoplasty reduction    . NEUROPLASTY / TRANSPOSITION MEDIAN NERVE AT CARPAL TUNNEL BILATERAL    . RE-EXCISION OF BREAST LUMPECTOMY    . TUBAL LIGATION      FAMILY HISTORY: Family History  Problem Relation Age of Onset  . Diabetes Mother   . Heart disease Mother   . Heart disease Father   . Cancer Sister   . Cancer Brother   . Cancer Sister     SOCIAL HISTORY: Social History   Socioeconomic History  . Marital status: Widowed    Spouse name: Not on file  . Number of children: 4  . Years of education: Not on file  . Highest education level: Not on file  Occupational History  . Not on file  Social Needs  . Financial resource strain: Not on file  . Food insecurity:    Worry: Not on file    Inability: Not on file  . Transportation needs:    Medical: Not on file    Non-medical: Not on file  Tobacco Use  . Smoking status: Never Smoker  . Smokeless tobacco: Never Used  Substance and Sexual Activity  . Alcohol use: No  . Drug use: No  . Sexual activity: Not on file  Lifestyle  . Physical activity:    Days per week: Not on file    Minutes per session: Not on file  . Stress: Not on file  Relationships  . Social connections:    Talks on phone: Not on file    Gets together: Not on file     Attends religious service: Not on file    Active member of club or organization: Not on file    Attends meetings of clubs or organizations: Not on file    Relationship status: Not on file  . Intimate partner violence:    Fear of current or ex partner: Not on  file    Emotionally abused: Not on file    Physically abused: Not on file    Forced sexual activity: Not on file  Other Topics Concern  . Not on file  Social History Narrative   Patient is right handed.   Patient drinks some caffeine daily.      PHYSICAL EXAM  Vitals:   07/10/17 1007  BP: (!) 148/94  Pulse: 72  Weight: 245 lb (111.1 kg)   Body mass index is 39.54 kg/m.  Generalized: Well developed, in no acute distress   Neurological examination  Mentation: Alert oriented to time, place, history taking. Follows all commands speech and language fluent Cranial nerve II-XII: Pupils were equal round reactive to light. Extraocular movements were full, visual field were full on confrontational test. Facial sensation and strength were normal. Uvula tongue midline. Head turning and shoulder shrug  were normal and symmetric. Motor: The motor testing reveals 5 over 5 strength of all 4 extremities. Good symmetric motor tone is noted throughout.  Sensory: Sensory testing is intact to soft touch on all 4 extremities. No evidence of extinction is noted.  Coordination: Cerebellar testing reveals good finger-nose-finger and heel-to-shin bilaterally.  Gait and station: Patient uses a cane when ambulating.  Slightly stooped posture.  Tandem gait not attempted. Reflexes: Deep tendon reflexes are symmetric and normal bilaterally.   DIAGNOSTIC DATA (LABS, IMAGING, TESTING) - I reviewed patient records, labs, notes, testing and imaging myself where available.  Lab Results  Component Value Date   WBC 8.4 11/15/2016   HGB 9.7 (L) 11/15/2016   HCT 29.6 (L) 11/15/2016   MCV 73.8 (L) 11/15/2016   PLT 133 (L) 11/15/2016      Component  Value Date/Time   NA 142 05/17/2017 1323   K 4.8 05/17/2017 1323   CL 100 05/17/2017 1323   CO2 27 05/17/2017 1323   GLUCOSE 92 05/17/2017 1323   GLUCOSE 91 11/15/2016 0252   BUN 31 05/17/2017 1323   CREATININE 1.71 (H) 05/17/2017 1323   CALCIUM 10.0 05/17/2017 1323   PROT 7.0 11/23/2016 1028   ALBUMIN 4.3 11/23/2016 1028   AST 20 11/23/2016 1028   ALT 12 11/23/2016 1028   ALKPHOS 53 11/23/2016 1028   BILITOT 0.3 11/23/2016 1028   GFRNONAA 26 (L) 05/17/2017 1323   GFRAA 30 (L) 05/17/2017 1323   Lab Results  Component Value Date   CHOL 172 08/04/2015   HDL 38 (L) 08/04/2015   LDLCALC 83 08/04/2015   TRIG 255 (H) 08/04/2015   CHOLHDL 4.5 (H) 08/04/2015   Lab Results  Component Value Date   HGBA1C 5.8% 01/07/2013   Lab Results  Component Value Date   VITAMINB12 1,654 (H) 08/30/2016   Lab Results  Component Value Date   TSH 1.740 08/01/2016      ASSESSMENT AND PLAN 84 y.o. year old female  has a past medical history of Chronic low back pain, Degenerative arthritis, Eczema, Edema, Gout, Headache, Hiatal hernia, Hypertension, Internal hemorrhoids, Neurogenic claudication, Obesity, Seizure disorder ([place]), Seizures ([place]), Stroke ([place]), and Urticaria. here with :    1.  Seizures  Overall the patient is doing well.  He will continue on Keppra extended release 1500.  She is advised that if she has any seizure events she should let us know.  She will follow-up in 1 year or sooner if needed.  I spent 15 minutes with the patient. 50% of this time was spent discussing her medication   Ward Givens, MSN, NP-C 07/10/2017,  10:25 AM [facility] [address] [place], [place] [address] [phone]

## 2017-07-12 ENCOUNTER — Other Ambulatory Visit: Payer: Self-pay | Admitting: Allergy and Immunology

## 2017-07-12 ENCOUNTER — Other Ambulatory Visit: Payer: Self-pay

## 2017-07-12 DIAGNOSIS — F411 Generalized anxiety disorder: Secondary | ICD-10-CM

## 2017-07-12 MED ORDER — ESCITALOPRAM OXALATE 5 MG PO TABS: 5.0000 mg | ORAL_TABLET | Freq: Every day | ORAL | 0 refills | Status: DC

## 2017-07-30 ENCOUNTER — Other Ambulatory Visit: Payer: Self-pay

## 2017-07-30 MED ORDER — CETIRIZINE HCL 10 MG PO TABS: 10.0000 mg | ORAL_TABLET | Freq: Two times a day (BID) | ORAL | 0 refills | Status: DC

## 2017-08-03 ENCOUNTER — Other Ambulatory Visit: Payer: Self-pay | Admitting: Family

## 2017-08-09 ENCOUNTER — Other Ambulatory Visit: Payer: Self-pay | Admitting: Allergy and Immunology

## 2017-08-09 NOTE — Telephone Encounter (Signed)
RF for Arnuity denied at [facility], pt needs an OV

## 2017-08-30 ENCOUNTER — Other Ambulatory Visit: Payer: Self-pay | Admitting: Allergy and Immunology

## 2017-09-04 ENCOUNTER — Other Ambulatory Visit: Payer: Self-pay | Admitting: Allergy and Immunology

## 2017-09-10 ENCOUNTER — Encounter: Payer: Self-pay | Admitting: Allergy and Immunology

## 2017-09-10 ENCOUNTER — Ambulatory Visit: Payer: Medicare Other | Admitting: Allergy and Immunology

## 2017-09-10 VITALS — BP 114/70 | HR 84 | Resp 20

## 2017-09-10 DIAGNOSIS — L500 Allergic urticaria: Secondary | ICD-10-CM

## 2017-09-10 DIAGNOSIS — L308 Other specified dermatitis: Secondary | ICD-10-CM

## 2017-09-10 DIAGNOSIS — K219 Gastro-esophageal reflux disease without esophagitis: Secondary | ICD-10-CM | POA: Diagnosis not present

## 2017-09-10 DIAGNOSIS — J4530 Mild persistent asthma, uncomplicated: Secondary | ICD-10-CM

## 2017-09-10 DIAGNOSIS — L989 Disorder of the skin and subcutaneous tissue, unspecified: Secondary | ICD-10-CM

## 2017-09-10 MED ORDER — CETIRIZINE HCL 10 MG PO TABS: 10.0000 mg | ORAL_TABLET | Freq: Two times a day (BID) | ORAL | 5 refills | Status: DC

## 2017-09-10 MED ORDER — FLUTICASONE FUROATE 200 MCG/ACT IN AEPB: 1.0000 | INHALATION_SPRAY | Freq: Every day | RESPIRATORY_TRACT | 5 refills | Status: DC

## 2017-09-10 MED ORDER — RANITIDINE HCL 150 MG PO TABS: 150.0000 mg | ORAL_TABLET | Freq: Two times a day (BID) | ORAL | 5 refills | Status: DC

## 2017-09-10 MED ORDER — MONTELUKAST SODIUM 10 MG PO TABS: ORAL_TABLET | ORAL | 5 refills | Status: DC

## 2017-09-10 NOTE — Progress Notes (Signed)
Follow-up Note  Referring Provider: [name], [name] Primary Provider: [name], [name] Date of Office Visit: 09/10/2017  Subjective:   [name] (DOB: December 24, 1923) is a 84 y.o. female who returns to the [facility] [place] on 09/10/2017 in re-evaluation of the following:  HPI: [name] presents to this clinic in evaluation of her pruritic dermatitis with a combination of urticaria and inflammatory dermatosis, asthma, and history of reflux induced respiratory disease.  Her last visit to this clinic was 19 December 2016.  There is some confusion exactly what medication she is using but as far as I can tell she does use Arnuity and she uses cetirizine and montelukast.  She may not be treating her reflux on a consistent basis although occasionally she may take some ranitidine.  She does use a steroid ointment after taking a shower.  Overall she believes that her airway is doing very well and she rarely uses a short acting bronchodilator and she has not required a systemic steroid to treat an exacerbation.  She does not really exercise because of musculoskeletal issues.  She still has some itchiness of her skin although she is much better overall on her current plan.  She does rub her skin on occasion but does not need to scratch her skin.  She has apparently been having some increased activity with seizure disorder and she is following up with her neurologist.  Allergies as of 09/10/2017      Reactions   Advicor [niacin-lovastatin Er] Other (See Comments)   unknown   Caduet [amlodipine-atorvastatin] Other (See Comments)   unknown   Codeine Other (See Comments)   unknown   Dilantin [phenytoin] Other (See Comments)   unknown   Lescol [fluvastatin] Other (See Comments)   Pamelor [nortriptyline] Other (See Comments)   unknown   Penicillins Other (See Comments)   unknown   Sulfa Antibiotics Other (See Comments)   unknown   Trileptal [oxcarbazepine] Other (See  Comments)   unknown   Ultram [tramadol] Other (See Comments)   unknown   Welchol [colesevelam] Other (See Comments)   unknown      Medication List      ARNUITY ELLIPTA 200 MCG/ACT Aepb Generic drug:  Fluticasone Furoate INHALE 1 DOSE INTO THE LUNGS DAILY. RINSE, GARGLE, AND SPIT AFTER USE.   aspirin 81 MG tablet Take 81 mg by mouth daily.   betamethasone dipropionate 0.05 % lotion Apply topically 2 (two) times daily.   cetirizine 10 MG tablet Commonly known as:  ZYRTEC Take 1 tablet (10 mg total) by mouth 2 (two) times daily.   diphenhydrAMINE 25 MG tablet Commonly known as:  BENADRYL Take 25 mg by mouth every morning.   escitalopram 5 MG tablet Commonly known as:  LEXAPRO Take 1 tablet (5 mg total) by mouth daily.   febuxostat 40 MG tablet Commonly known as:  ULORIC Take 1 tablet (40 mg total) by mouth daily.   ferrous sulfate 325 (65 FE) MG tablet Take 325 mg by mouth every Monday, Wednesday, and Friday.   hydrOXYzine 50 MG tablet Commonly known as:  ATARAX/VISTARIL TAKE 1 TABLET (50 MG TOTAL) BY MOUTH EVERY 8 (EIGHT) HOURS AS NEEDED FOR ITCHING.   levETIRAcetam 500 MG 24 hr tablet Commonly known as:  KEPPRA XR TAKE 3 TABLETS (1,500 MG TOTAL) BY MOUTH AT BEDTIME.   metolazone 2.5 MG tablet Commonly known as:  ZAROXOLYN TAKE 1 TABLET ON MON, WED, AND FRIDAY   mometasone 0.1 % ointment  Commonly known as:  ELOCON Apply to skin once daily as directed after bathing.   montelukast 10 MG tablet Commonly known as:  SINGULAIR TAKE 1 TABLET BY MOUTH EVERYDAY AT BEDTIME   nystatin-triamcinolone ointment Commonly known as:  MYCOLOG Apply 1 application topically 2 (two) times daily.   omeprazole 40 MG capsule Commonly known as:  PRILOSEC TAKE 1 CAPSULE BY MOUTH EVERY DAY   ranitidine 150 MG tablet Commonly known as:  ZANTAC TAKE 1 TABLET BY MOUTH TWICE A DAY       Past Medical History:  Diagnosis Date  . Chronic low back pain   . Degenerative  arthritis   . Eczema   . Edema   . Gout   . Headache   . Hiatal hernia   . Hypertension   . Internal hemorrhoids   . Neurogenic claudication   . Obesity   . Seizure disorder ([place])   . Seizures ([place])   . Stroke ([place])   . Urticaria     Past Surgical History:  Procedure Laterality Date  . ABDOMINAL HYSTERECTOMY    . APPENDECTOMY    . APPENDECTOMY    . bilateral knees    . cataracts    . CHOLECYSTECTOMY    . mammoplasty reduction    . NEUROPLASTY / TRANSPOSITION MEDIAN NERVE AT CARPAL TUNNEL BILATERAL    . RE-EXCISION OF BREAST LUMPECTOMY    . TUBAL LIGATION      Review of systems negative except as noted in HPI / PMHx or noted below:  Review of Systems  Constitutional: Negative.   HENT: Negative.   Eyes: Negative.   Respiratory: Negative.   Cardiovascular: Negative.   Gastrointestinal: Negative.   Genitourinary: Negative.   Musculoskeletal: Negative.   Skin: Negative.   Neurological: Negative.   Endo/Heme/Allergies: Negative.   Psychiatric/Behavioral: Negative.      Objective:   Vitals:   09/10/17 1605  BP: 114/70  Pulse: 84  Resp: 20          Physical Exam  HENT:  Head: Normocephalic.  Right Ear: Tympanic membrane, external ear and ear canal normal.  Left Ear: Tympanic membrane, external ear and ear canal normal.  Nose: Nose normal. No mucosal edema or rhinorrhea.  Mouth/Throat: Uvula is midline, oropharynx is clear and moist and mucous membranes are normal. No oropharyngeal exudate.  Eyes: Conjunctivae are normal.  Neck: Trachea normal. No tracheal tenderness present. No tracheal deviation present. No thyromegaly present.  Cardiovascular: Normal rate, regular rhythm, S1 normal, S2 normal and normal heart sounds.  No murmur heard. Pulmonary/Chest: Breath sounds normal. No stridor. No respiratory distress. She has no wheezes. She has no rales.  Musculoskeletal: She exhibits no edema.  Lymphadenopathy:       Head (right side): No tonsillar  adenopathy present.       Head (left side): No tonsillar adenopathy present.    She has no cervical adenopathy.  Neurological: She is alert.  Skin: No rash noted. She is not diaphoretic. No erythema. Nails show no clubbing.    Diagnostics:   The patient had an Asthma Control Test with the following results: ACT Total Score: 24.    Assessment and Plan:   1. Asthma, well controlled, mild persistent   2. Gastroesophageal reflux disease, esophagitis presence not specified   3. Allergic urticaria   4. Inflammatory dermatosis     1. Continue the following:   A. Arnuity 200 - one inhalation one time per day  B. Cetirizine 10mg  one tablet  two times per day  C. Ranitidine 150 one tablet two times per day  D. Montelukast 10mg  one tablet one time per day  E. Shower followed by steroid ointment if needed  2. ONLY use albuterol nebulization or Proventil HFA IF NEEDED  3. Return to clinic in 6 months or earlier if problem    Aiana appears to be doing okay.  I have given her a written list of the medications that she should be using on a consistent basis to prevent her from developing significant problems with her skin and her airway.  If she does well I will see her back in this clinic in 6 months or earlier if there is a problem.  Allena Katz, MD Allergy / Immunology [place]

## 2017-09-10 NOTE — Patient Instructions (Addendum)
  1. Continue the following:   A. Arnuity 200 - one inhalation one time per day  B. Cetirizine 10mg  one tablet two times per day  C. Ranitidine 150 one tablet two times per day  D. Montelukast 10mg  one tablet one time per day  E. Shower followed by steroid ointment if needed  2. ONLY use albuterol nebulization or Proventil HFA IF NEEDED  3. Return to clinic in 6 months or earlier if problem

## 2017-09-11 ENCOUNTER — Encounter: Payer: Self-pay | Admitting: Allergy and Immunology

## 2017-09-12 ENCOUNTER — Encounter: Payer: Self-pay | Admitting: Adult Health

## 2017-09-12 ENCOUNTER — Ambulatory Visit: Payer: Medicare Other | Admitting: Adult Health

## 2017-09-12 VITALS — BP 149/85 | HR 85 | Ht 66.0 in | Wt 244.2 lb

## 2017-09-12 DIAGNOSIS — R569 Unspecified convulsions: Secondary | ICD-10-CM | POA: Diagnosis not present

## 2017-09-12 DIAGNOSIS — Z5181 Encounter for therapeutic drug level monitoring: Secondary | ICD-10-CM | POA: Diagnosis not present

## 2017-09-12 MED ORDER — LEVETIRACETAM ER 500 MG PO TB24: 2000.0000 mg | ORAL_TABLET | Freq: Every day | ORAL | 11 refills | Status: DC

## 2017-09-12 NOTE — Patient Instructions (Signed)
Your Plan:  Increase Keppra XR from 3 tablets at bedtime to 4 tablets at bedtime for a total of 2000 mg.  If your symptoms worsen or you develop new symptoms please let us know.   Thank you for coming to see [place] at [facility]. I hope we have been able to provide you high quality care today.  You may receive a patient satisfaction survey over the next few weeks. We would appreciate your feedback and comments so that we may continue to improve ourselves and the health of our patients.

## 2017-09-12 NOTE — Progress Notes (Signed)
I have read the note, and I agree with the clinical assessment and plan.  [name] K Nanami [place]   

## 2017-09-12 NOTE — Progress Notes (Signed)
PATIENT: LINDSAY STRAKA DOB: 03-23-1924  REASON FOR VISIT: follow up HISTORY FROM: patient  HISTORY OF PRESENT ILLNESS: Today 09/12/17 Ms. Saldarriaga is a 84 year old female with a history of partial complex type seizures.  She returns today for follow-up.  She is currently on Keppra extended release 1500 mg at bedtime.  She reports that since March she had 3 seizure events.  She reports she really does not occur during the night.  She states the third event she was sitting at her table when she had her neighbor over.  The neighbor said that she started mumbling and was "spacing out."  Her daughter is with her today.  She reports that this is typically how her seizures present.  In the past she has been on phenobarbital but there was some confusion on her part with her medication and she has stopped this medication several visits ago.  She reports that she has tolerated Keppra well.  She denies missing any medication.  There does seem to be some confusion surrounding her medications.  The patient does not operate a motor vehicle.  She returns today for evaluation.  HISTORY Merkle is a 84 year old female with a history of partial complex type seizures.  She returns today for follow-up.  She remains on Keppra extended release 1500 mg at bedtime.  She denies any seizure events.  She lives at home alone.  Is able to complete most ADLs independently.  She does require some supervision when taking a shower.  She does require help with housework.  She does not operate a motor vehicle.  Overall she reports that she is doing well.  She returns today for evaluation.    REVIEW OF SYSTEMS: Out of a complete 14 system review of symptoms, the patient complains only of the following symptoms, and all other reviewed systems are negative.  Chills, fatigue, facial swelling, ear pain, ringing in ears, runny nose, cough, seizure  ALLERGIES: Allergies  Allergen Reactions  . Advicor [Niacin-Lovastatin Er] Other (See  Comments)    unknown  . Caduet [Amlodipine-Atorvastatin] Other (See Comments)    unknown  . Codeine Other (See Comments)    unknown  . Dilantin [Phenytoin] Other (See Comments)    unknown  . Lescol [Fluvastatin] Other (See Comments)  . Pamelor [Nortriptyline] Other (See Comments)    unknown  . Penicillins Other (See Comments)    unknown  . Sulfa Antibiotics Other (See Comments)    unknown  . Trileptal [Oxcarbazepine] Other (See Comments)    unknown  . Ultram [Tramadol] Other (See Comments)    unknown  . Welchol [Colesevelam] Other (See Comments)    unknown    HOME MEDICATIONS: Outpatient Medications Prior to Visit  Medication Sig Dispense Refill  . aspirin 81 MG tablet Take 81 mg by mouth daily.    . betamethasone dipropionate 0.05 % lotion Apply topically 2 (two) times daily. 120 mL 0  . cetirizine (ZYRTEC) 10 MG tablet Take 1 tablet (10 mg total) by mouth 2 (two) times daily. 30 tablet 5  . diphenhydrAMINE (BENADRYL) 25 MG tablet Take 25 mg by mouth every morning.    . escitalopram (LEXAPRO) 5 MG tablet Take 1 tablet (5 mg total) by mouth daily. 90 tablet 0  . febuxostat (ULORIC) 40 MG tablet Take 1 tablet (40 mg total) by mouth daily. 90 tablet 1  . Fluticasone Furoate (ARNUITY ELLIPTA) 200 MCG/ACT AEPB Inhale 1 puff into the lungs daily. 30 each 5  . hydrOXYzine (ATARAX/VISTARIL) 50  MG tablet TAKE 1 TABLET (50 MG TOTAL) BY MOUTH EVERY 8 (EIGHT) HOURS AS NEEDED FOR ITCHING. 30 tablet 2  . levETIRAcetam (KEPPRA XR) 500 MG 24 hr tablet TAKE 3 TABLETS (1,500 MG TOTAL) BY MOUTH AT BEDTIME. 90 tablet 11  . mometasone (ELOCON) 0.1 % ointment Apply to skin once daily as directed after bathing. 90 g 3  . montelukast (SINGULAIR) 10 MG tablet TAKE 1 TABLET BY MOUTH EVERYDAY AT BEDTIME 30 tablet 5  . nystatin-triamcinolone ointment (MYCOLOG) Apply 1 application topically 2 (two) times daily. 90 g 1  . omeprazole (PRILOSEC) 40 MG capsule TAKE 1 CAPSULE BY MOUTH EVERY DAY 30 capsule 5  .  ranitidine (ZANTAC) 150 MG tablet Take 1 tablet (150 mg total) by mouth 2 (two) times daily. 30 tablet 5   No facility-administered medications prior to visit.     PAST MEDICAL HISTORY: Past Medical History:  Diagnosis Date  . Chronic low back pain   . Degenerative arthritis   . Eczema   . Edema   . Gout   . Headache   . Hiatal hernia   . Hypertension   . Internal hemorrhoids   . Neurogenic claudication   . Obesity   . Seizure disorder ([place])   . Seizures ([place])   . Stroke ([place])   . Urticaria     PAST SURGICAL HISTORY: Past Surgical History:  Procedure Laterality Date  . ABDOMINAL HYSTERECTOMY    . APPENDECTOMY    . APPENDECTOMY    . bilateral knees    . cataracts    . CHOLECYSTECTOMY    . mammoplasty reduction    . NEUROPLASTY / TRANSPOSITION MEDIAN NERVE AT CARPAL TUNNEL BILATERAL    . RE-EXCISION OF BREAST LUMPECTOMY    . TUBAL LIGATION      FAMILY HISTORY: Family History  Problem Relation Age of Onset  . Diabetes Mother   . Heart disease Mother   . Heart disease Father   . Cancer Sister   . Cancer Brother   . Cancer Sister     SOCIAL HISTORY: Social History   Socioeconomic History  . Marital status: Widowed    Spouse name: Not on file  . Number of children: 4  . Years of education: Not on file  . Highest education level: Not on file  Occupational History  . Not on file  Social Needs  . Financial resource strain: Not on file  . Food insecurity:    Worry: Not on file    Inability: Not on file  . Transportation needs:    Medical: Not on file    Non-medical: Not on file  Tobacco Use  . Smoking status: Never Smoker  . Smokeless tobacco: Never Used  Substance and Sexual Activity  . Alcohol use: No  . Drug use: No  . Sexual activity: Not on file  Lifestyle  . Physical activity:    Days per week: Not on file    Minutes per session: Not on file  . Stress: Not on file  Relationships  . Social connections:    Talks on phone: Not on file     Gets together: Not on file    Attends religious service: Not on file    Active member of club or organization: Not on file    Attends meetings of clubs or organizations: Not on file    Relationship status: Not on file  . Intimate partner violence:    Fear of current or ex partner: Not  on file    Emotionally abused: Not on file    Physically abused: Not on file    Forced sexual activity: Not on file  Other Topics Concern  . Not on file  Social History Narrative   Patient is right handed.   Patient drinks some caffeine daily.      PHYSICAL EXAM  Vitals:   09/12/17 0747  BP: (!) 149/85  Pulse: 85  Weight: 244 lb 3.2 oz (110.8 kg)  Height: 5\' 6"  (1.676 m)   Body mass index is 39.41 kg/m.  Generalized: Well developed, in no acute distress   Neurological examination  Mentation: Alert oriented to time, place, history taking. Follows all commands speech and language fluent Cranial nerve II-XII: Pupils were equal round reactive to light. Extraocular movements were full, visual field were full on confrontational test. Facial sensation and strength were normal. Uvula tongue midline. Head turning and shoulder shrug  were normal and symmetric. Motor: The motor testing reveals 5 over 5 strength of all 4 extremities. Good symmetric motor tone is noted throughout.  Sensory: Sensory testing is intact to soft touch on all 4 extremities. No evidence of extinction is noted.  Coordination: Cerebellar testing reveals good finger-nose-finger and heel-to-shin bilaterally.  Gait and station: Patient uses a cane when ambulating.  Tandem gait not attempted.  Posture when ambulating. Reflexes: Deep tendon reflexes are symmetric and normal bilaterally.   DIAGNOSTIC DATA (LABS, IMAGING, TESTING) - I reviewed patient records, labs, notes, testing and imaging myself where available.  Lab Results  Component Value Date   WBC 8.4 11/15/2016   HGB 9.7 (L) 11/15/2016   HCT 29.6 (L) 11/15/2016   MCV 73.8  (L) 11/15/2016   PLT 133 (L) 11/15/2016      Component Value Date/Time   NA 142 05/17/2017 1323   K 4.8 05/17/2017 1323   CL 100 05/17/2017 1323   CO2 27 05/17/2017 1323   GLUCOSE 92 05/17/2017 1323   GLUCOSE 91 11/15/2016 0252   BUN 31 05/17/2017 1323   CREATININE 1.71 (H) 05/17/2017 1323   CALCIUM 10.0 05/17/2017 1323   PROT 7.0 11/23/2016 1028   ALBUMIN 4.3 11/23/2016 1028   AST 20 11/23/2016 1028   ALT 12 11/23/2016 1028   ALKPHOS 53 11/23/2016 1028   BILITOT 0.3 11/23/2016 1028   GFRNONAA 26 (L) 05/17/2017 1323   GFRAA 30 (L) 05/17/2017 1323   Lab Results  Component Value Date   CHOL 172 08/04/2015   HDL 38 (L) 08/04/2015   LDLCALC 83 08/04/2015   TRIG 255 (H) 08/04/2015   CHOLHDL 4.5 (H) 08/04/2015   Lab Results  Component Value Date   HGBA1C 5.8% 01/07/2013   Lab Results  Component Value Date   VITAMINB12 1,654 (H) 08/30/2016   Lab Results  Component Value Date   TSH 1.740 08/01/2016      ASSESSMENT AND PLAN 84 y.o. year old female  has a past medical history of Chronic low back pain, Degenerative arthritis, Eczema, Edema, Gout, Headache, Hiatal hernia, Hypertension, Internal hemorrhoids, Neurogenic claudication, Obesity, Seizure disorder ([place]), Seizures ([place]), Stroke ([place]), and Urticaria. here with:  1.  Seizures  The patient has had 3 additional seizure since March.  I will increase Keppra extended release to 2000 mg at bedtime.  I have advised that if she is unable to tolerate this medication she will let me know.  Also recommended using a pillbox for her medication.  Perhaps her daughter could oversee this.  They voiced understanding.  I will check blood work today.  I advised that if she has any additional seizure events they should let us know.  We will follow-up in a month or sooner if needed.  I spent 15 minutes with the patient. 50% of this time was spent discussing seizure precaution   Ward Givens, MSN, NP-C 09/12/2017, 7:53 AM [facility] [address], [place], [place] [address] [phone]

## 2017-09-15 LAB — LEVETIRACETAM LEVEL: Levetiracetam Lvl: 69.4 ug/mL — ABNORMAL HIGH (ref 10.0–40.0)

## 2017-09-16 ENCOUNTER — Telehealth: Payer: Self-pay | Admitting: *Deleted

## 2017-09-16 NOTE — Telephone Encounter (Signed)
Spoke with patient and informed her that her Keppra level was slightly elevated, but it has been so in the past. Advised her there is no change in therapy. She is to continue taking Keppra 500 mg, take 4 tablets every night. She repeated directions correctly, verbalized understanding, appreciation.

## 2017-10-14 ENCOUNTER — Other Ambulatory Visit: Payer: Self-pay | Admitting: Family

## 2017-10-14 DIAGNOSIS — F411 Generalized anxiety disorder: Secondary | ICD-10-CM

## 2017-10-27 ENCOUNTER — Other Ambulatory Visit: Payer: Self-pay | Admitting: Family

## 2017-11-29 ENCOUNTER — Other Ambulatory Visit: Payer: Self-pay | Admitting: Family

## 2017-11-29 MED ORDER — ALLOPURINOL 100 MG PO TABS: 50.0000 mg | ORAL_TABLET | Freq: Every day | ORAL | 1 refills | Status: DC

## 2017-11-29 NOTE — Progress Notes (Signed)
Patients daughter aware and verbalizes understanding.

## 2017-11-29 NOTE — Progress Notes (Signed)
The FDA released a new black box warning for febuxostat regarding a higher rate of mortality in patients with cardiovascular disease. With her history of CVA we will  stop Uloric and start allopurinol 50 mg daily.

## 2017-12-07 ENCOUNTER — Other Ambulatory Visit: Payer: Self-pay | Admitting: Allergy and Immunology

## 2017-12-30 ENCOUNTER — Ambulatory Visit (INDEPENDENT_AMBULATORY_CARE_PROVIDER_SITE_OTHER): Payer: Medicare Other | Admitting: *Deleted

## 2017-12-30 ENCOUNTER — Encounter: Payer: Self-pay | Admitting: *Deleted

## 2017-12-30 VITALS — BP 132/74 | HR 84 | Ht 61.0 in | Wt 246.0 lb

## 2017-12-30 DIAGNOSIS — Z0000 Encounter for general adult medical examination without abnormal findings: Secondary | ICD-10-CM

## 2017-12-30 NOTE — Progress Notes (Addendum)
Subjective:   [name] is a 84 y.o. female who presents for a Medicare Annual Wellness Visit. [name] lives at home alone. She has four children. Her daughter lives in [place]. Her sons live in [place], [place], and [place]. Her daughter comes to check on her and talks with her often on the phone. She also has a neighbor that has a key to her home that checks on her daily.  She is a retired Education officer, museum and taught for 33 years. She had a large family; 48 brothers and sisters. Only two brothers are living.   Review of Systems    Patient reports that her overall health is unchanged compared to last year.  Cardiac Risk Factors include: obesity (BMI >30kg/m2);sedentary lifestyle;hypertension;dyslipidemia;advanced age (>62men, >69 women);family history of premature cardiovascular disease  Musculoskeletal: chronic lower back pain. Some left shoulder and arm pain since falling last week. Pain is improving though.  Neuro: Missed at least one dose of Keppra last week and then had an episode a week ago where she woke up on the floor wedged between the bed and the side table. She had some left shoulder and arm pain afterwards but no headache. Unsure if she lost consciousness. Likely seizure activity. Her neighbor heard her hollering for help and went in and found her. EMS was called and helped her out of the floor but they did not transport her for evaluation.    All other systems negative       Current Medications (verified) Outpatient Encounter Medications as of 12/30/2017  Medication Sig  . allopurinol (ZYLOPRIM) 100 MG tablet Take 0.5 tablets (50 mg total) by mouth daily.  Marland Kitchen aspirin 81 MG tablet Take 81 mg by mouth daily.  . betamethasone dipropionate 0.05 % lotion Apply topically 2 (two) times daily.  . cetirizine (ZYRTEC) 10 MG tablet TAKE 1 TABLET BY MOUTH TWICE A DAY  . diphenhydrAMINE (BENADRYL) 25 MG tablet Take 25 mg by mouth every morning.  . escitalopram (LEXAPRO) 5 MG  tablet TAKE 1 TABLET BY MOUTH EVERY DAY  . Fluticasone Furoate (ARNUITY ELLIPTA) 200 MCG/ACT AEPB Inhale 1 puff into the lungs daily.  . hydrOXYzine (ATARAX/VISTARIL) 50 MG tablet TAKE 1 TABLET (50 MG TOTAL) BY MOUTH EVERY 8 (EIGHT) HOURS AS NEEDED FOR ITCHING.  . levETIRAcetam (KEPPRA XR) 500 MG 24 hr tablet Take 4 tablets (2,000 mg total) by mouth at bedtime.  . metolazone (ZAROXOLYN) 2.5 MG tablet TAKE 1 TABLET ON MONDAYS, WEDNESDAYS, AND FRIDAYS  . mometasone (ELOCON) 0.1 % ointment Apply to skin once daily as directed after bathing.  . montelukast (SINGULAIR) 10 MG tablet TAKE 1 TABLET BY MOUTH EVERYDAY AT BEDTIME  . nystatin-triamcinolone ointment (MYCOLOG) Apply 1 application topically 2 (two) times daily.  Marland Kitchen omeprazole (PRILOSEC) 40 MG capsule TAKE 1 CAPSULE BY MOUTH EVERY DAY  . ranitidine (ZANTAC) 150 MG tablet TAKE 1 TABLET BY MOUTH TWICE A DAY   No facility-administered encounter medications on file as of 12/30/2017.     Allergies (verified) Advicor [niacin-lovastatin er]; Caduet [amlodipine-atorvastatin]; Codeine; Dilantin [phenytoin]; Lescol [fluvastatin]; Pamelor [nortriptyline]; Penicillins; Sulfa antibiotics; Trileptal [oxcarbazepine]; Ultram [tramadol]; and Welchol [colesevelam]   History: Past Medical History:  Diagnosis Date  . Chronic low back pain   . Degenerative arthritis   . Eczema   . Edema   . Gout   . Headache   . Hiatal hernia   . Hypertension   . Internal hemorrhoids   . Neurogenic claudication   . Obesity   .  Seizure disorder ([place])   . Seizures ([place])   . Stroke ([place])   . Urticaria    Past Surgical History:  Procedure Laterality Date  . ABDOMINAL HYSTERECTOMY    . APPENDECTOMY    . APPENDECTOMY    . bilateral knees    . cataracts    . CHOLECYSTECTOMY    . mammoplasty reduction    . NEUROPLASTY / TRANSPOSITION MEDIAN NERVE AT CARPAL TUNNEL BILATERAL    . RE-EXCISION OF BREAST LUMPECTOMY    . TUBAL LIGATION     Family History  Problem  Relation Age of Onset  . Diabetes Mother   . Heart disease Mother   . Heart disease Father   . Cancer Sister   . Cancer Brother   . Cancer Sister    Social History   Socioeconomic History  . Marital status: Widowed    Spouse name: Not on file  . Number of children: 4  . Years of education: Not on file  . Highest education level: Not on file  Occupational History  . Occupation: Pharmacist, hospital    Comment: retired  Scientific laboratory technician  . Financial resource strain: Not hard at all  . Food insecurity:    Worry: Never true    Inability: Never true  . Transportation needs:    Medical: No    Non-medical: No  Tobacco Use  . Smoking status: Never Smoker  . Smokeless tobacco: Never Used  Substance and Sexual Activity  . Alcohol use: No  . Drug use: No  . Sexual activity: Not Currently  Lifestyle  . Physical activity:    Days per week: 0 days    Minutes per session: 0 min  . Stress: Only a little  Relationships  . Social connections:    Talks on phone: More than three times a week    Gets together: More than three times a week    Attends religious service: More than 4 times per year    Active member of club or organization: Yes    Attends meetings of clubs or organizations: More than 4 times per year    Relationship status: Widowed  Other Topics Concern  . Not on file  Social History Narrative   Patient is right handed.   Patient drinks some caffeine daily.    Tobacco Use No.  Clinical Intake:  Pre-visit preparation completed: No  Pain : 0-10 Pain Score: 4  Pain Type: Acute pain Pain Location: Arm Pain Orientation: Left Pain Descriptors / Indicators: Aching Pain Onset: In the past 7 days Pain Frequency: Constant Effect of Pain on Daily Activities: moderate     Nutritional Status: BMI > 30  Obese Diabetes: No  How often do you need to have someone help you when you read instructions, pamphlets, or other written materials from your doctor or pharmacy?: 1 - Never What  is the last grade level you completed in school?: bachelor's degree-teacher     Information entered by :: Chong Sicilian, RN   Activities of Daily Living In your present state of health, do you have any difficulty performing the following activities: 12/30/2017  Hearing? Y  Comment thinks she has some wax buildup  Vision? N  Comment has routine eye exams  Difficulty concentrating or making decisions? N  Walking or climbing stairs? Y  Dressing or bathing? N  Doing errands, shopping? N  Preparing Food and eating ? N  Using the Toilet? N  In the past six months, have you accidently leaked  urine? Y  Do you have problems with loss of bowel control? Y  Managing your Medications? N  Managing your Finances? N  Housekeeping or managing your Housekeeping? N  Some recent data might be hidden    Diet Avtivia or Cereal for breakfast Daughter brings food at times. Has soups and quick foods available  Doesn't usually eat 3 meals but snacks more or less Decreased appetite-feels like she wants things but can't eat it all   Exercise Current Exercise Habits: The patient does not participate in regular exercise at present, Exercise limited by: neurologic condition(s);orthopedic condition(s)   Depression Screen PHQ 2/9 Scores 05/17/2017 02/12/2017 11/23/2016 08/30/2016 07/17/2016 05/18/2016 12/26/2015  PHQ - 2 Score 1 1 2  0 0 2 2  PHQ- 9 Score - - 6 - 3 6 6      Fall Risk Fall Risk  07/10/2017 05/17/2017 02/12/2017 11/23/2016 08/30/2016  Falls in the past year? No No No No No    Safety Is the patient's home free of loose throw rugs in walkways, pet beds, electrical cords, etc?   no      Handrails on the stairs?   yes      Adequate lighting?   yes  Patient Care Team: [name], [name] as PCP - General (Family Medicine) Edrick Oh, MD as Consulting Physician (Nephrology)  Hospitalizations, surgeries, and ER visits in previous 12 months No hospitalizations, ER visits, or surgeries this past  year.   Objective:    Today's Vitals   12/30/17 1111 12/30/17 1112  BP: (!) 157/92 132/74  Pulse: 86 84  Weight: 246 lb (111.6 kg)   Height: 5\' 1"  (1.549 m)   PainSc:  4    Body mass index is 46.48 kg/m.  Advanced Directives 11/13/2016 11/13/2016 02/27/2016 10/11/2015 09/16/2015 01/04/2015 03/29/2014  Does Patient Have a Medical Advance Directive? No No No No No No Yes  Type of Advance Directive - - - - - - Living will  Would patient like information on creating a medical advance directive? No - Patient declined - No - patient declined information No - patient declined information Yes - Educational materials given - -    Hearing/Vision  No hearing or vision deficits noted during visit.  Cognitive Function: MMSE - Mini Mental State Exam 12/30/2017 09/16/2015  Orientation to time 4 5  Orientation to Place 5 5  Registration 3 3  Attention/ Calculation 2 3  Recall 3 1  Language- name 2 objects 2 2  Language- repeat 1 1  Language- follow 3 step command 3 3  Language- read & follow direction 1 1  Write a sentence 1 1  Copy design 0 1  Total score 25 26       Normal Cognitive Function Screening: No: No real change from previous though    Immunizations and Health Maintenance There is no immunization history for the selected administration types on file for this patient. Health Maintenance Due  Topic Date Due  . TETANUS/TDAP  05/26/1944  . DEXA SCAN  05/26/1990  . PNA vac Low Risk Adult (1 of 2 - PCV13) 05/26/1990  . INFLUENZA VACCINE  11/14/2017   Health Maintenance  Topic Date Due  . Samul Dada  05/26/1944  . DEXA SCAN  05/26/1990  . PNA vac Low Risk Adult (1 of 2 - PCV13) 05/26/1990  . INFLUENZA VACCINE  11/14/2017        Assessment:   This is a routine wellness examination for [name].    Plan:  Goals    . Exercise 150 min/wk Moderate Activity        Additional Screening Recommendations: Lung: Low Dose CT Chest recommended if Age 34-80 years, 30  pack-year currently smoking OR have quit w/in 15years. Patient does not qualify. Hepatitis C Screening recommended: no   Keep f/u with [name], [name] and any other specialty appointments you may have Continue current medications Move carefully to avoid falls. Recommended rolling walker with seat and brakes instead of a one point cane. Aim for at least 150 minutes of moderate activity a week. This can be done with chair exercises if necessary. Handout given and reviewed.  Aveeno lotion for dry/itchy skin Read or work on puzzles daily Stay connected with friends and family  I have personally reviewed and noted the following in the patient's chart:   . Medical and social history . Use of alcohol, tobacco or illicit drugs  . Current medications and supplements . Functional ability and status . Nutritional status . Physical activity . Advanced directives . List of other physicians . Hospitalizations, surgeries, and ER visits in previous 12 months . Vitals . Screenings to include cognitive, depression, and falls . Referrals and appointments  In addition, I have reviewed and discussed with patient certain preventive protocols, quality metrics, and best practice recommendations. A written personalized care plan for preventive services as well as general preventive health recommendations were provided to patient.     Chong Sicilian, RN   12/30/2017     I have reviewed and agree with the above AWV documentation.   [name], [name]

## 2017-12-30 NOTE — Patient Instructions (Addendum)
  Ms. Badgett , Thank you for taking time to come for your Medicare Wellness Visit. I appreciate your ongoing commitment to your health goals. Please review the following plan we discussed and let me know if I can assist you in the future.   These are the goals we discussed: Goals    . Exercise 150 min/wk Moderate Activity       This is a list of the screening recommended for you and due dates:  Health Maintenance  Topic Date Due  . Tetanus Vaccine  05/26/1944  . DEXA scan (bone density measurement)  05/26/1990  . Pneumonia vaccines (1 of 2 - PCV13) 05/26/1990  . Flu Shot  11/14/2017   Aveeno or generic for dry skin

## 2018-01-05 ENCOUNTER — Other Ambulatory Visit: Payer: Self-pay | Admitting: Family

## 2018-01-06 NOTE — Telephone Encounter (Signed)
Last seen 05/17/17  [name]

## 2018-01-07 ENCOUNTER — Other Ambulatory Visit: Payer: Self-pay | Admitting: Family

## 2018-01-07 DIAGNOSIS — F411 Generalized anxiety disorder: Secondary | ICD-10-CM

## 2018-01-24 ENCOUNTER — Other Ambulatory Visit: Payer: Self-pay | Admitting: Family

## 2018-01-24 DIAGNOSIS — F411 Generalized anxiety disorder: Secondary | ICD-10-CM

## 2018-03-02 ENCOUNTER — Other Ambulatory Visit: Payer: Self-pay | Admitting: Allergy and Immunology

## 2018-03-04 ENCOUNTER — Encounter: Payer: Self-pay | Admitting: Allergy and Immunology

## 2018-03-04 ENCOUNTER — Ambulatory Visit: Payer: Medicare Other | Admitting: Allergy and Immunology

## 2018-03-04 VITALS — BP 124/98 | HR 93 | Resp 18 | Ht 64.0 in | Wt 248.0 lb

## 2018-03-04 DIAGNOSIS — K219 Gastro-esophageal reflux disease without esophagitis: Secondary | ICD-10-CM

## 2018-03-04 DIAGNOSIS — L500 Allergic urticaria: Secondary | ICD-10-CM

## 2018-03-04 DIAGNOSIS — L308 Other specified dermatitis: Secondary | ICD-10-CM | POA: Diagnosis not present

## 2018-03-04 DIAGNOSIS — J4530 Mild persistent asthma, uncomplicated: Secondary | ICD-10-CM | POA: Diagnosis not present

## 2018-03-04 DIAGNOSIS — L989 Disorder of the skin and subcutaneous tissue, unspecified: Secondary | ICD-10-CM

## 2018-03-04 DIAGNOSIS — Z87898 Personal history of other specified conditions: Secondary | ICD-10-CM

## 2018-03-04 MED ORDER — FAMOTIDINE 40 MG PO TABS: 40.0000 mg | ORAL_TABLET | Freq: Every day | ORAL | 5 refills | Status: DC

## 2018-03-04 MED ORDER — MOMETASONE FUROATE 0.1 % EX OINT: TOPICAL_OINTMENT | CUTANEOUS | 3 refills | Status: DC

## 2018-03-04 NOTE — Progress Notes (Signed)
Follow-up Note  Referring Provider: [name], [name] Primary Provider: [name], [name] Date of Office Visit: 03/04/2018  Subjective:   [name] (DOB: 05-Apr-1924) is a 84 y.o. female who returns to the [facility] on 03/04/2018 in re-evaluation of the following:  HPI: [name] returns to this clinic in reevaluation of her pruritic dermatitis with an inflammatory component, asthma, and reflux induced respiratory disease.  Her last visit to this clinic was 10 Sep 2017.  Her skin is dramatically improved.  She no longer sees any type of rash on her skin.  She still has some intermittent itching occasionally that appears to be episodes of 15 minutes or so may be 1 time per day for which she will utilize her topical agents with relief.  Her asthma has under excellent control.  She does not use a short acting bronchodilator and she has not had an exacerbation of asthma and has not required a systemic steroid to treat this condition.  She continues on Arnuity on a regular basis.  She has been having some epigastric "feelings".  It should be noted that she discontinued her ranitidine because of the media attention to contaminants.  She has also had some postnasal drip stuck in her throat since discontinuing this agent.  There appears to be 2 episodes of possible seizure activity in October.  She cannot remember the details of the event other than the fact that she has woken up on the ground after "falling out".  This appeared to occur on Keppra in the treatment of her seizure disorder but she may have missed a few pills around that point in time.  She is scheduled to see her neurologist on 24 February 2018.  Allergies as of 03/04/2018      Reactions   Advicor [niacin-lovastatin Er] Other (See Comments)   unknown   Caduet [amlodipine-atorvastatin] Other (See Comments)   unknown   Codeine Other (See Comments)   unknown   Dilantin [phenytoin] Other (See Comments)     unknown   Lescol [fluvastatin] Other (See Comments)   Pamelor [nortriptyline] Other (See Comments)   unknown   Penicillins Other (See Comments)   unknown   Sulfa Antibiotics Other (See Comments)   unknown   Trileptal [oxcarbazepine] Other (See Comments)   unknown   Ultram [tramadol] Other (See Comments)   unknown   Welchol [colesevelam] Other (See Comments)   unknown      Medication List      allopurinol 100 MG tablet Commonly known as:  ZYLOPRIM Take 0.5 tablets (50 mg total) by mouth daily.   aspirin 81 MG tablet Take 81 mg by mouth daily.   cetirizine 10 MG tablet Commonly known as:  ZYRTEC TAKE 1 TABLET BY MOUTH TWICE A DAY   diphenhydrAMINE 25 MG tablet Commonly known as:  BENADRYL Take 25 mg by mouth every morning.   escitalopram 5 MG tablet Commonly known as:  LEXAPRO TAKE 1 TABLET (5 MG TOTAL) BY MOUTH DAILY. (NEEDS TO BE SEEN BEFORE NEXT REFILL)   Fluticasone Furoate 200 MCG/ACT Aepb Inhale 1 puff into the lungs daily.   hydrOXYzine 50 MG tablet Commonly known as:  ATARAX/VISTARIL TAKE 1 TABLET (50 MG TOTAL) BY MOUTH EVERY 8 (EIGHT) HOURS AS NEEDED FOR ITCHING.   levETIRAcetam 500 MG 24 hr tablet Commonly known as:  KEPPRA XR Take 4 tablets (2,000 mg total) by mouth at bedtime.   metolazone 2.5 MG tablet Commonly known as:  ZAROXOLYN TAKE 1 TABLET ON MONDAYS, WEDNESDAYS, AND FRIDAYS   mometasone 0.1 % ointment Commonly known as:  ELOCON Apply to skin once daily as directed after bathing.   montelukast 10 MG tablet Commonly known as:  SINGULAIR TAKE 1 TABLET BY MOUTH EVERYDAY AT BEDTIME   nystatin-triamcinolone ointment Commonly known as:  MYCOLOG Apply 1 application topically 2 (two) times daily.   omeprazole 40 MG capsule Commonly known as:  PRILOSEC TAKE 1 CAPSULE BY MOUTH EVERY DAY   ranitidine 150 MG tablet Commonly known as:  ZANTAC TAKE 1 TABLET BY MOUTH TWICE A DAY       Past Medical History:  Diagnosis Date  . Chronic  low back pain   . Degenerative arthritis   . Eczema   . Edema   . Gout   . Headache   . Hiatal hernia   . Hypertension   . Internal hemorrhoids   . Neurogenic claudication   . Obesity   . Seizure disorder ([place])   . Seizures ([place])   . Stroke ([place])   . Urticaria     Past Surgical History:  Procedure Laterality Date  . ABDOMINAL HYSTERECTOMY    . APPENDECTOMY    . APPENDECTOMY    . bilateral knees    . cataracts    . CHOLECYSTECTOMY    . mammoplasty reduction    . NEUROPLASTY / TRANSPOSITION MEDIAN NERVE AT CARPAL TUNNEL BILATERAL    . RE-EXCISION OF BREAST LUMPECTOMY    . TUBAL LIGATION      Review of systems negative except as noted in HPI / PMHx or noted below:  Review of Systems  Constitutional: Negative.   HENT: Negative.   Eyes: Negative.   Respiratory: Negative.   Cardiovascular: Negative.   Gastrointestinal: Negative.   Genitourinary: Negative.   Musculoskeletal: Negative.   Skin: Negative.   Neurological: Negative.   Endo/Heme/Allergies: Negative.   Psychiatric/Behavioral: Negative.      Objective:   Vitals:   03/04/18 1000  BP: (!) 124/98  Pulse: 93  Resp: 18  SpO2: 98%   Height: 5\' 4"  (162.6 cm)  Weight: 248 lb (112.5 kg)   Physical Exam  HENT:  Head: Normocephalic.  Right Ear: Tympanic membrane, external ear and ear canal normal.  Left Ear: Tympanic membrane, external ear and ear canal normal.  Nose: Nose normal. No mucosal edema or rhinorrhea.  Mouth/Throat: Uvula is midline, oropharynx is clear and moist and mucous membranes are normal. No oropharyngeal exudate.  Eyes: Conjunctivae are normal.  Neck: Trachea normal. No tracheal tenderness present. No tracheal deviation present. No thyromegaly present.  Cardiovascular: Normal rate, regular rhythm, S1 normal, S2 normal and normal heart sounds.  No murmur heard. Pulmonary/Chest: Breath sounds normal. No stridor. No respiratory distress. She has no wheezes. She has no rales.    Musculoskeletal: She exhibits no edema.  Lymphadenopathy:       Head (right side): No tonsillar adenopathy present.       Head (left side): No tonsillar adenopathy present.    She has no cervical adenopathy.  Neurological: She is alert.  Skin: No rash noted. She is not diaphoretic. No erythema. Nails show no clubbing.    Diagnostics:    Spirometry was performed and demonstrated an FEV1 of 1.64 at 134 % of predicted.  The patient had an Asthma Control Test with the following results: ACT Total Score: 17.    Assessment and Plan:   1. Asthma, well controlled, mild persistent   2. Allergic  urticaria   3. Inflammatory dermatosis   4. Gastroesophageal reflux disease, esophagitis presence not specified   5. History of seizures     1. Continue the following:   A. Arnuity 200 - one inhalation one time per day  B. Cetirizine 10mg  one tablet two times per day  C. Famotidine 40 mg 1 tablet 1 time per day (replaces ranitidine)  D. Montelukast 10mg  one tablet one time per day  E. Shower followed by steroid ointment if needed  2. ONLY use albuterol nebulization or Proventil HFA IF NEEDED  3. Contact neurologist about episodes of seizure activity that developed in October  4. Return to clinic in 6 months or earlier if problem    Chanley appears to be doing relatively well and I am not really going to change much of her therapy today other than to encourage her to use an H2 receptor blocker as she probably has a component of reflux and LPR contributing to the postnasal drip stuck in her throat.  Overall her overactive immune system which previously was associated with eosinophilia appears to be under good control with her current plan.  She will continue on this collection of medical therapy and I will see her back in this clinic in 6 months or earlier if there is a problem.   Allena Katz, MD Allergy / Immunology [place]

## 2018-03-04 NOTE — Patient Instructions (Addendum)
  1. Continue the following:   A. Arnuity 200 - one inhalation one time per day  B. Cetirizine 10mg  one tablet two times per day  C. Famotidine 40 mg 1 tablet 1 time per day (replaces ranitidine)  D. Montelukast 10mg  one tablet one time per day  E. Shower followed by steroid ointment if needed  2. ONLY use albuterol nebulization or Proventil HFA IF NEEDED  3. Contact neurologist about episodes of seizure activity that developed in October  4. Return to clinic in 6 months or earlier if problem

## 2018-03-05 ENCOUNTER — Encounter: Payer: Self-pay | Admitting: Allergy and Immunology

## 2018-03-17 ENCOUNTER — Encounter: Payer: Self-pay | Admitting: Family

## 2018-03-17 ENCOUNTER — Ambulatory Visit: Payer: Medicare Other | Admitting: Family

## 2018-03-17 VITALS — BP 130/77 | HR 79 | Temp 97.7°F | Ht 64.0 in | Wt 241.8 lb

## 2018-03-17 DIAGNOSIS — I10 Essential (primary) hypertension: Secondary | ICD-10-CM | POA: Diagnosis not present

## 2018-03-17 DIAGNOSIS — J4530 Mild persistent asthma, uncomplicated: Secondary | ICD-10-CM | POA: Diagnosis not present

## 2018-03-17 DIAGNOSIS — N189 Chronic kidney disease, unspecified: Secondary | ICD-10-CM

## 2018-03-17 DIAGNOSIS — N184 Chronic kidney disease, stage 4 (severe): Secondary | ICD-10-CM | POA: Diagnosis not present

## 2018-03-17 DIAGNOSIS — L500 Allergic urticaria: Secondary | ICD-10-CM | POA: Diagnosis not present

## 2018-03-17 DIAGNOSIS — B370 Candidal stomatitis: Secondary | ICD-10-CM

## 2018-03-17 DIAGNOSIS — F411 Generalized anxiety disorder: Secondary | ICD-10-CM

## 2018-03-17 DIAGNOSIS — D631 Anemia in chronic kidney disease: Secondary | ICD-10-CM

## 2018-03-17 MED ORDER — ESCITALOPRAM OXALATE 5 MG PO TABS: ORAL_TABLET | ORAL | 1 refills | Status: DC

## 2018-03-17 MED ORDER — NYSTATIN 100000 UNIT/ML MT SUSP: 5.0000 mL | Freq: Four times a day (QID) | OROMUCOSAL | 0 refills | Status: DC

## 2018-03-17 NOTE — Progress Notes (Signed)
Subjective:    Patient ID: [name], female    DOB: 06-05-1923, 84 y.o.   MRN: [id number]  Chief Complaint  Patient presents with  . Medical Management of Chronic Issues    Pt presents to the office today for chronic follow up. Pt seen Cardiologist for HTN, chest pain, and edema. Pt had ECHO of 54%. Pt is followed by Nephrologists CKD stage 4 annually. She is followed by Neurologists every 6 months for seizures. She is followed by Allergists  For asthma, allergic urticaria, and GERD.    Hypertension  This is a chronic problem. The current episode started more than 1 year ago. The problem has been resolved since onset. The problem is controlled. Associated symptoms include anxiety, malaise/fatigue and peripheral edema ("at times"). Pertinent negatives include no headaches or shortness of breath. Risk factors for coronary artery disease include dyslipidemia, obesity and sedentary lifestyle. The current treatment provides moderate improvement. Hypertensive end-organ damage includes heart failure.  Asthma  She complains of hoarse voice. There is no cough ("every once in awhile"), shortness of breath or wheezing. This is a chronic problem. The current episode started more than 1 year ago. The problem occurs intermittently. Associated symptoms include malaise/fatigue. Pertinent negatives include no headaches. Her past medical history is significant for asthma.  Anxiety  Presents for follow-up visit. Symptoms include excessive worry, irritability, nervous/anxious behavior and restlessness. Patient reports no shortness of breath. Symptoms occur most days. The severity of symptoms is moderate. The quality of sleep is good.   Her past medical history is significant for anemia and asthma.  Anemia  Presents for follow-up visit. Symptoms include bruises/bleeds easily and malaise/fatigue. Past medical history includes heart failure.  Mouth Lesions   The current episode started more than 2 weeks ago.  The onset was sudden. The problem occurs occasionally. The problem is mild. Associated symptoms include mouth sores. Pertinent negatives include no headaches, no cough ("every once in awhile") and no wheezing.      Review of Systems  Constitutional: Positive for irritability and malaise/fatigue.  HENT: Positive for hoarse voice and mouth sores.   Respiratory: Negative for cough ("every once in awhile"), shortness of breath and wheezing.   Neurological: Negative for headaches.  Hematological: Bruises/bleeds easily.  Psychiatric/Behavioral: The patient is nervous/anxious.   All other systems reviewed and are negative.      Objective:   Physical Exam  Constitutional: She is oriented to person, place, and time. She appears well-developed and well-nourished. No distress.  HENT:  Head: Normocephalic and atraumatic.  Right Ear: External ear normal.  Left Ear: External ear normal.  Mouth/Throat: Oropharynx is clear and moist. She has dentures. Oral lesions present.  Eyes: Pupils are equal, round, and reactive to light.  Neck: Normal range of motion. Neck supple. No thyromegaly present.  Cardiovascular: Normal rate, regular rhythm, normal heart sounds and intact distal pulses.  No murmur heard. Pulmonary/Chest: Effort normal. No respiratory distress. She has decreased breath sounds. She has no wheezes.  Abdominal: Soft. Bowel sounds are normal. She exhibits no distension. There is no tenderness.  Musculoskeletal: Normal range of motion. She exhibits edema (trace BLE). She exhibits no tenderness.  Neurological: She is alert and oriented to person, place, and time. She has normal reflexes. No cranial nerve deficit.  Skin: Skin is warm and dry.  Psychiatric: She has a normal mood and affect. Her behavior is normal. Judgment and thought content normal.  Vitals reviewed.     BP 130/77  Pulse 79   Temp 97.7 F (36.5 C) (Oral)   Ht '5\' 4"'  (1.626 m)   Wt 241 lb 12.8 oz (109.7 kg)   BMI  41.50 kg/m      Assessment & Plan:  [name] comes in today with chief complaint of Medical Management of Chronic Issues   Diagnosis and orders addressed:  1. Mild persistent asthma without complication - QSY99+PMVA  2. Essential hypertension, benign - CMP14+EGFR  3. Allergic urticaria - CMP14+EGFR  4. Chronic kidney disease (CKD), stage IV (severe) (HCC) - CMP14+EGFR  5. Morbid obesity ([place]) - CMP14+EGFR  6. GAD (generalized anxiety disorder) We will increase Lexapro to 10 mg. She has not taken in over a week. So we will start 5 mg for 2 weeks and then increase to 10 mg. Stress management discussed - CMP14+EGFR - escitalopram (LEXAPRO) 5 MG tablet; Take 1 tablet (5 mg total) by mouth daily for 14 days, THEN 2 tablets (10 mg total) daily for 14 days.  Dispense: 90 tablet; Refill: 1  7. Anemia associated with chronic renal failure - Anemia Profile B - CMP14+EGFR  8. Oral thrush Rinse mouth after using inhalers - escitalopram (LEXAPRO) 5 MG tablet; Take 1 tablet (5 mg total) by mouth daily for 14 days, THEN 2 tablets (10 mg total) daily for 14 days.  Dispense: 90 tablet; Refill: 1   Labs pending Health Maintenance reviewed Diet and exercise encouraged  Follow up plan: 6 months    [name], [name]

## 2018-03-17 NOTE — Patient Instructions (Signed)
Oral Thrush, Adult Oral thrush, also called oral candidiasis, is a fungal infection that develops in the mouth and throat and on the tongue. It causes white patches to form on the mouth and tongue. Thrush is most common in older adults, but it can occur at any age. Many cases of thrush are mild, but this infection can also be serious. Thrush can be a repeated (recurrent) problem for certain people who have a weak body defense system (immune system). The weakness can be caused by chronic illnesses, or by taking medicines that limit the body's ability to fight infection. If a person has difficulty fighting infection, the fungus that causes thrush can spread through the body. This can cause life-threatening blood or organ infections. What are the causes? This condition is caused by a fungus (yeast) called Candida albicans.  This fungus is normally present in small amounts in the mouth and on other mucous membranes. It usually causes no harm.  If conditions are present that allow the fungus to grow without control, it invades surrounding tissues and becomes an infection.  Other Candida species can also lead to thrush (rare).  What increases the risk? This condition is more likely to develop in:  People with a weakened immune system.  Older adults.  People with HIV (human immunodeficiency virus).  People with diabetes.  People with dry mouth (xerostomia).  Pregnant women.  People with poor dental care, especially people who have false teeth.  People who use antibiotic medicines.  What are the signs or symptoms? Symptoms of this condition can vary from mild and moderate to severe and persistent. Symptoms may include:  A burning feeling in the mouth and throat. This can occur at the start of a thrush infection.  White patches that stick to the mouth and tongue. The tissue around the patches may be red, raw, and painful. If rubbed (during tooth brushing, for example), the patches and the  tissue of the mouth may bleed easily.  A bad taste in the mouth or difficulty tasting foods.  A cottony feeling in the mouth.  Pain during eating and swallowing.  Poor appetite.  Cracking at the corners of the mouth.  How is this diagnosed? This condition is diagnosed based on:  Physical exam. Your health care provider will look in your mouth.  Health history. Your health care provider will ask you questions about your health.  How is this treated? This condition is treated with medicines called antifungals, which prevent the growth of fungi. These medicines are either applied directly to the affected area (topical) or swallowed (oral). The treatment will depend on the severity of the condition. Mild thrush Mild cases of thrush may clear up with the use of an antifungal mouth rinse or lozenges. Treatment usually lasts about 14 days. Moderate to severe thrush  More severe thrush infections that have spread to the esophagus are treated with an oral antifungal medicine. A topical antifungal medicine may also be used.  For some severe infections, treatment may need to continue for more than 14 days.  Oral antifungal medicines are rarely used during pregnancy because they may be harmful to the unborn child. If you are pregnant, talk with your health care provider about options for treatment. Persistent or recurrent thrush For cases of thrush that do not go away or keep coming back:  Treatment may be needed twice as long as the symptoms last.  Treatment will include both oral and topical antifungal medicines.  People with a weakened immune   system can take an antifungal medicine on a continuous basis to prevent thrush infections.  It is important to treat conditions that make a person more likely to get thrush, such as diabetes or HIV. Follow these instructions at home: Medicines  Take over-the-counter and prescription medicines only as told by your health care provider.  Talk  with your health care provider about an over-the-counter medicine called gentian violet, which kills bacteria and fungi. Relieving soreness and discomfort To help reduce the discomfort of thrush:  Drink cold liquids such as water or iced tea.  Try flavored ice treats or frozen juices.  Eat foods that are easy to swallow, such as gelatin, ice cream, or custard.  Try drinking from a straw if the patches in your mouth are painful.  General instructions  Eat plain, unflavored yogurt as directed by your health care provider. Check the label to make sure the yogurt contains live cultures. This yogurt can help healthy bacteria to grow in the mouth and can stop the growth of the fungus that causes thrush.  If you wear dentures, remove the dentures before going to bed, brush them vigorously, and soak them in a cleaning solution as directed by your health care provider.  Rinse your mouth with a warm salt-water mixture several times a day. To make a salt-water mixture, completely dissolve 1/2-1 tsp of salt in 1 cup of warm water. Contact a health care provider if:  Your symptoms are getting worse or are not improving within 7 days of starting treatment.  You have symptoms of a spreading infection, such as white patches on the skin outside of the mouth. This information is not intended to replace advice given to you by your health care provider. Make sure you discuss any questions you have with your health care provider. Document Released: 12/27/2003 Document Revised: 12/26/2015 Document Reviewed: 12/26/2015 Elsevier Interactive Patient Education  2017 [employer]  

## 2018-03-18 ENCOUNTER — Telehealth: Payer: Self-pay | Admitting: *Deleted

## 2018-03-18 LAB — CMP14+EGFR
A/G RATIO: 1.5 (ref 1.2–2.2)
ALT: 11 IU/L (ref 0–32)
AST: 18 IU/L (ref 0–40)
Albumin: 4.2 g/dL (ref 3.2–4.6)
Alkaline Phosphatase: 57 IU/L (ref 39–117)
BUN/Creatinine Ratio: 17 (ref 12–28)
BUN: 25 mg/dL (ref 10–36)
Bilirubin Total: 0.4 mg/dL (ref 0.0–1.2)
CO2: 29 mmol/L (ref 20–29)
CREATININE: 1.47 mg/dL — AB (ref 0.57–1.00)
Calcium: 10 mg/dL (ref 8.7–10.3)
Chloride: 99 mmol/L (ref 96–106)
GFR, EST AFRICAN AMERICAN: 35 mL/min/{1.73_m2} — AB (ref >59)
GFR, EST NON AFRICAN AMERICAN: 31 mL/min/{1.73_m2} — AB (ref >59)
Globulin, Total: 2.8 g/dL (ref 1.5–4.5)
Glucose: 78 mg/dL (ref 65–99)
POTASSIUM: 4.3 mmol/L (ref 3.5–5.2)
Sodium: 142 mmol/L (ref 134–144)
Total Protein: 7 g/dL (ref 6.0–8.5)

## 2018-03-18 LAB — ANEMIA PROFILE B
BASOS ABS: 0.2 10*3/uL (ref 0.0–0.2)
BASOS: 2 %
EOS (ABSOLUTE): 0.3 10*3/uL (ref 0.0–0.4)
Eos: 4 %
FERRITIN: 225 ng/mL — AB (ref 15–150)
Folate: >20 ng/mL (ref >3.0)
HEMATOCRIT: 33.6 % — AB (ref 34.0–46.6)
HEMOGLOBIN: 11 g/dL — AB (ref 11.1–15.9)
IRON SATURATION: 25 % (ref 15–55)
IRON: 68 ug/dL (ref 27–139)
Immature Grans (Abs): 0.1 10*3/uL (ref 0.0–0.1)
Immature Granulocytes: 1 %
Lymphocytes Absolute: 2.9 10*3/uL (ref 0.7–3.1)
Lymphs: 33 %
MCH: 25.8 pg — ABNORMAL LOW (ref 26.6–33.0)
MCHC: 32.7 g/dL (ref 31.5–35.7)
MCV: 79 fL (ref 79–97)
MONOCYTES: 4 %
Monocytes Absolute: 0.3 10*3/uL (ref 0.1–0.9)
NEUTROS ABS: 4.9 10*3/uL (ref 1.4–7.0)
NRBC: 1 % — AB (ref 0–0)
Neutrophils: 56 %
Platelets: 162 10*3/uL (ref 150–450)
RBC: 4.27 x10E6/uL (ref 3.77–5.28)
RDW: 19.6 % — ABNORMAL HIGH (ref 12.3–15.4)
Retic Ct Pct: 1.6 % (ref 0.6–2.6)
TIBC: 270 ug/dL (ref 250–450)
UIBC: 202 ug/dL (ref 118–369)
Vitamin B-12: 1882 pg/mL — ABNORMAL HIGH (ref 232–1245)
WBC: 8.7 10*3/uL (ref 3.4–10.8)

## 2018-03-18 MED ORDER — TRIAMCINOLONE ACETONIDE 0.1 % EX CREA: 1.0000 "application " | TOPICAL_CREAM | Freq: Two times a day (BID) | CUTANEOUS | 2 refills | Status: DC

## 2018-03-18 NOTE — Telephone Encounter (Signed)
Patient called needed triamcinolone cream called in. States we sent in diff one writer did send in cream per last ov Dr Neldon Mc had verbal ordered all creams patient needed and we sent in

## 2018-03-26 ENCOUNTER — Other Ambulatory Visit: Payer: Self-pay | Admitting: Allergy and Immunology

## 2018-03-26 ENCOUNTER — Encounter: Payer: Self-pay | Admitting: Adult Health

## 2018-03-26 ENCOUNTER — Ambulatory Visit: Payer: Medicare Other | Admitting: Adult Health

## 2018-03-26 VITALS — BP 107/66 | HR 100 | Ht 64.0 in | Wt 241.0 lb

## 2018-03-26 DIAGNOSIS — R569 Unspecified convulsions: Secondary | ICD-10-CM

## 2018-03-26 DIAGNOSIS — Z5181 Encounter for therapeutic drug level monitoring: Secondary | ICD-10-CM

## 2018-03-26 MED ORDER — LEVETIRACETAM ER 500 MG PO TB24: 2000.0000 mg | ORAL_TABLET | Freq: Every day | ORAL | 3 refills | Status: DC

## 2018-03-26 NOTE — Progress Notes (Signed)
PATIENT: [name] DOB: 1923-08-24  REASON FOR VISIT: follow up HISTORY FROM: patient  HISTORY OF PRESENT ILLNESS: Today 03/26/18:   [name] is a 84 year old female with a history of partial complex type seizures.  She returns today for follow-up.  She states that she had 2 events in October.  These were unwitnessed events but she was found on the floor.  She states with both events she had to call EMS to help her get up.  She did refuse to go to the hospital.  She states that she is having some left arm pain from those falls but she has followed with her primary care provider.  She is unsure if these were seizure events.  Her daughter does state that during that time she was running out of her Keppra but did not make her daughter aware.  The patient does use a pillbox for her morning medications but does not use a pillbox for her nighttime medications.  She typically takes Keppra at bedtime.  She returns today for evaluation.  HISTORY 09/12/17 [name] is a 84 year old female with a history of partial complex type seizures.  She returns today for follow-up.  She is currently on Keppra extended release 1500 mg at bedtime.  She reports that since March she had 3 seizure events.  She reports she really does not occur during the night.  She states the third event she was sitting at her table when she had her neighbor over.  The neighbor said that she started mumbling and was "spacing out."  Her daughter is with her today.  She reports that this is typically how her seizures present.  In the past she has been on phenobarbital but there was some confusion on her part with her medication and she has stopped this medication several visits ago.  She reports that she has tolerated Keppra well.  She denies missing any medication.  There does seem to be some confusion surrounding her medications.  The patient does not operate a motor vehicle.  She returns today for evaluation.  REVIEW OF SYSTEMS: Out  of a complete 14 system review of symptoms, the patient complains only of the following symptoms, and all other reviewed systems are negative.  ALLERGIES: Allergies  Allergen Reactions  . Advicor [Niacin-Lovastatin Er] Other (See Comments)    unknown  . Caduet [Amlodipine-Atorvastatin] Other (See Comments)    unknown  . Codeine Other (See Comments)    unknown  . Dilantin [Phenytoin] Other (See Comments)    unknown  . Lescol [Fluvastatin] Other (See Comments)  . Pamelor [Nortriptyline] Other (See Comments)    unknown  . Penicillins Other (See Comments)    unknown  . Sulfa Antibiotics Other (See Comments)    unknown  . Trileptal [Oxcarbazepine] Other (See Comments)    unknown  . Ultram [Tramadol] Other (See Comments)    unknown  . Welchol [Colesevelam] Other (See Comments)    unknown    HOME MEDICATIONS: Outpatient Medications Prior to Visit  Medication Sig Dispense Refill  . acetaminophen (TYLENOL) 500 MG tablet Take 1,000 mg by mouth as needed.    Marland Kitchen allopurinol (ZYLOPRIM) 100 MG tablet Take 0.5 tablets (50 mg total) by mouth daily. 45 tablet 1  . ARNUITY ELLIPTA 200 MCG/ACT AEPB TAKE 1 PUFF BY MOUTH EVERY DAY 30 each 5  . aspirin 81 MG tablet Take 81 mg by mouth daily.    Marland Kitchen escitalopram (LEXAPRO) 5 MG tablet Take 1 tablet (5  mg total) by mouth daily for 14 days, THEN 2 tablets (10 mg total) daily for 14 days. 90 tablet 1  . famotidine (PEPCID) 40 MG tablet Take 1 tablet (40 mg total) by mouth daily. 30 tablet 5  . levETIRAcetam (KEPPRA XR) 500 MG 24 hr tablet Take 4 tablets (2,000 mg total) by mouth at bedtime. 120 tablet 11  . metolazone (ZAROXOLYN) 2.5 MG tablet TAKE 1 TABLET ON MONDAYS, WEDNESDAYS, AND FRIDAYS 36 tablet 1  . montelukast (SINGULAIR) 10 MG tablet TAKE 1 TABLET BY MOUTH EVERYDAY AT BEDTIME 30 tablet 5  . Propylene Glycol (SYSTANE BALANCE OP) Apply 1 drop to eye 2 (two) times daily.    Marland Kitchen triamcinolone cream (KENALOG) 0.1 % Apply 1 application topically 2 (two)  times daily. 453.6 g 2  . mometasone (ELOCON) 0.1 % ointment Apply to skin once daily as directed after bathing. (Patient not taking: Reported on 03/26/2018) 90 g 3  . nystatin (MYCOSTATIN) 100000 UNIT/ML suspension Take 5 mLs (500,000 Units total) by mouth 4 (four) times daily. (Patient not taking: Reported on 03/26/2018) 473 mL 0  . nystatin-triamcinolone ointment (MYCOLOG) Apply 1 application topically 2 (two) times daily. (Patient not taking: Reported on 03/26/2018) 90 g 1   No facility-administered medications prior to visit.     PAST MEDICAL HISTORY: Past Medical History:  Diagnosis Date  . Chronic low back pain   . Degenerative arthritis   . Eczema   . Edema   . Gout   . Headache   . Hiatal hernia   . Hypertension   . Internal hemorrhoids   . Neurogenic claudication   . Obesity   . Seizure disorder ([place])   . Seizures ([place])   . Stroke ([place])   . Urticaria     PAST SURGICAL HISTORY: Past Surgical History:  Procedure Laterality Date  . ABDOMINAL HYSTERECTOMY    . APPENDECTOMY    . APPENDECTOMY    . bilateral knees    . cataracts    . CHOLECYSTECTOMY    . mammoplasty reduction    . NEUROPLASTY / TRANSPOSITION MEDIAN NERVE AT CARPAL TUNNEL BILATERAL    . RE-EXCISION OF BREAST LUMPECTOMY    . TUBAL LIGATION      FAMILY HISTORY: Family History  Problem Relation Age of Onset  . Diabetes Mother   . Heart disease Mother   . Heart disease Father   . Cancer Sister   . Cancer Brother   . Cancer Sister     SOCIAL HISTORY: Social History   Socioeconomic History  . Marital status: Widowed    Spouse name: Not on file  . Number of children: 4  . Years of education: Not on file  . Highest education level: Not on file  Occupational History  . Occupation: Pharmacist, hospital    Comment: retired  Scientific laboratory technician  . Financial resource strain: Not hard at all  . Food insecurity:    Worry: Never true    Inability: Never true  . Transportation needs:    Medical: No     Non-medical: No  Tobacco Use  . Smoking status: Never Smoker  . Smokeless tobacco: Never Used  Substance and Sexual Activity  . Alcohol use: No  . Drug use: No  . Sexual activity: Not Currently  Lifestyle  . Physical activity:    Days per week: 0 days    Minutes per session: 0 min  . Stress: Only a little  Relationships  . Social connections:  Talks on phone: More than three times a week    Gets together: More than three times a week    Attends religious service: More than 4 times per year    Active member of club or organization: Yes    Attends meetings of clubs or organizations: More than 4 times per year    Relationship status: Widowed  . Intimate partner violence:    Fear of current or ex partner: No    Emotionally abused: No    Physically abused: No    Forced sexual activity: No  Other Topics Concern  . Not on file  Social History Narrative   Patient is right handed.   Patient drinks some caffeine daily.      PHYSICAL EXAM  Vitals:   03/26/18 0840  BP: 107/66  Pulse: 100  Weight: 241 lb (109.3 kg)  Height: 5\' 4"  (1.626 m)   Body mass index is 41.37 kg/m.  Generalized: Well developed, in no acute distress   Neurological examination  Mentation: Alert oriented to time, place, history taking. Follows all commands speech and language fluent Cranial nerve II-XII: Pupils were equal round reactive to light. Extraocular movements were full, visual field were full on confrontational test. Facial sensation and strength were normal. Uvula tongue midline. Head turning and shoulder shrug  were normal and symmetric. Motor: The motor testing reveals 5 over 5 strength of all 4 extremities. Good symmetric motor tone is noted throughout.  Sensory: Sensory testing is intact to soft touch on all 4 extremities. No evidence of extinction is noted.  Coordination: Cerebellar testing reveals good finger-nose-finger and heel-to-shin bilaterally.  Gait and station: Patient uses a  cane when ambulating.  Tandem gait not attempted. Reflexes: Deep tendon reflexes are symmetric and normal bilaterally.   DIAGNOSTIC DATA (LABS, IMAGING, TESTING) - I reviewed patient records, labs, notes, testing and imaging myself where available.  Lab Results  Component Value Date   WBC 8.7 03/17/2018   HGB 11.0 (L) 03/17/2018   HCT 33.6 (L) 03/17/2018   MCV 79 03/17/2018   PLT 162 03/17/2018      Component Value Date/Time   NA 142 03/17/2018 1051   K 4.3 03/17/2018 1051   CL 99 03/17/2018 1051   CO2 29 03/17/2018 1051   GLUCOSE 78 03/17/2018 1051   GLUCOSE 91 11/15/2016 0252   BUN 25 03/17/2018 1051   CREATININE 1.47 (H) 03/17/2018 1051   CALCIUM 10.0 03/17/2018 1051   PROT 7.0 03/17/2018 1051   ALBUMIN 4.2 03/17/2018 1051   AST 18 03/17/2018 1051   ALT 11 03/17/2018 1051   ALKPHOS 57 03/17/2018 1051   BILITOT 0.4 03/17/2018 1051   GFRNONAA 31 (L) 03/17/2018 1051   GFRAA 35 (L) 03/17/2018 1051   Lab Results  Component Value Date   CHOL 172 08/04/2015   HDL 38 (L) 08/04/2015   LDLCALC 83 08/04/2015   TRIG 255 (H) 08/04/2015   CHOLHDL 4.5 (H) 08/04/2015   Lab Results  Component Value Date   HGBA1C 5.8% 01/07/2013   Lab Results  Component Value Date   VITAMINB12 1,882 (H) 03/17/2018   Lab Results  Component Value Date   TSH 1.740 08/01/2016      ASSESSMENT AND PLAN 84 y.o. year old female  has a past medical history of Chronic low back pain, Degenerative arthritis, Eczema, Edema, Gout, Headache, Hiatal hernia, Hypertension, Internal hemorrhoids, Neurogenic claudication, Obesity, Seizure disorder ([place]), Seizures ([place]), Stroke ([place]), and Urticaria. here with:  1.  Seizures  The patient is encouraged to take [place] consistently.  She will continue on Keppra extended release 2000 mg at bedtime.  I have sent in a prescription for 90-day supply.  I encouraged the patient to get a nighttime pillbox in order to keep up with her medications.  She voiced  understanding.  Advised that she should never skip a dose of Keppra.  She was advised that if she has any additional events she should let us know.  She will follow-up in 6 months or sooner if needed.    Ward Givens, MSN, NP-C 03/26/2018, 8:55 AM [facility] [address] [place], [place] [address] [phone]

## 2018-03-26 NOTE — Patient Instructions (Signed)
Your Plan:  Continue Keppra ER 2000 mg Get pillbox for nighttime meds If your symptoms worsen or you develop new symptoms please let us know.   Thank you for coming to see [place] at [facility]. I hope we have been able to provide you high quality care today.  You may receive a patient satisfaction survey over the next few weeks. We would appreciate your feedback and comments so that we may continue to improve ourselves and the health of our patients.

## 2018-03-26 NOTE — Progress Notes (Signed)
I have read the note, and I agree with the clinical assessment and plan.  Josedejesus Marcum K Jobe Mutch   

## 2018-04-14 ENCOUNTER — Emergency Department (HOSPITAL_COMMUNITY)
Admission: EM | Admit: 2018-04-14 | Discharge: 2018-04-14 | Disposition: A | Discharge status: Home / Self Care | Payer: Medicare Other | Attending: Emergency Medicine | Admitting: Emergency Medicine

## 2018-04-14 ENCOUNTER — Encounter (HOSPITAL_COMMUNITY): Payer: Self-pay | Admitting: *Deleted

## 2018-04-14 ENCOUNTER — Emergency Department (HOSPITAL_COMMUNITY): Payer: Medicare Other

## 2018-04-14 ENCOUNTER — Other Ambulatory Visit: Payer: Self-pay

## 2018-04-14 DIAGNOSIS — Y9389 Activity, other specified: Secondary | ICD-10-CM | POA: Diagnosis not present

## 2018-04-14 DIAGNOSIS — W010XXA Fall on same level from slipping, tripping and stumbling without subsequent striking against object, initial encounter: Secondary | ICD-10-CM | POA: Insufficient documentation

## 2018-04-14 DIAGNOSIS — R000 Tachycardia, unspecified: Secondary | ICD-10-CM | POA: Diagnosis not present

## 2018-04-14 DIAGNOSIS — S8002XA Contusion of left knee, initial encounter: Secondary | ICD-10-CM | POA: Insufficient documentation

## 2018-04-14 DIAGNOSIS — N184 Chronic kidney disease, stage 4 (severe): Secondary | ICD-10-CM | POA: Diagnosis not present

## 2018-04-14 DIAGNOSIS — Y999 Unspecified external cause status: Secondary | ICD-10-CM | POA: Insufficient documentation

## 2018-04-14 DIAGNOSIS — J45909 Unspecified asthma, uncomplicated: Secondary | ICD-10-CM | POA: Diagnosis not present

## 2018-04-14 DIAGNOSIS — Z79899 Other long term (current) drug therapy: Secondary | ICD-10-CM | POA: Insufficient documentation

## 2018-04-14 DIAGNOSIS — I129 Hypertensive chronic kidney disease with stage 1 through stage 4 chronic kidney disease, or unspecified chronic kidney disease: Secondary | ICD-10-CM | POA: Diagnosis not present

## 2018-04-14 DIAGNOSIS — S8992XA Unspecified injury of left lower leg, initial encounter: Secondary | ICD-10-CM | POA: Diagnosis present

## 2018-04-14 DIAGNOSIS — Y92009 Unspecified place in unspecified non-institutional (private) residence as the place of occurrence of the external cause: Secondary | ICD-10-CM | POA: Diagnosis not present

## 2018-04-14 LAB — URINALYSIS, ROUTINE W REFLEX MICROSCOPIC
BILIRUBIN URINE: NEGATIVE
Glucose, UA: NEGATIVE mg/dL
Hgb urine dipstick: NEGATIVE
Ketones, ur: NEGATIVE mg/dL
Leukocytes, UA: NEGATIVE
Nitrite: NEGATIVE
Protein, ur: NEGATIVE mg/dL
Specific Gravity, Urine: 1.011 (ref 1.005–1.030)
pH: 6 (ref 5.0–8.0)

## 2018-04-14 LAB — COMPREHENSIVE METABOLIC PANEL
ALK PHOS: 43 U/L (ref 38–126)
ALT: 19 U/L (ref 0–44)
AST: 28 U/L (ref 15–41)
Albumin: 3.5 g/dL (ref 3.5–5.0)
Anion gap: 7 (ref 5–15)
BUN: 24 mg/dL — ABNORMAL HIGH (ref 8–23)
CO2: 27 mmol/L (ref 22–32)
Calcium: 9.8 mg/dL (ref 8.9–10.3)
Chloride: 106 mmol/L (ref 98–111)
Creatinine, Ser: 1.53 mg/dL — ABNORMAL HIGH (ref 0.44–1.00)
GFR calc Af Amer: 34 mL/min — ABNORMAL LOW (ref >60)
GFR calc non Af Amer: 29 mL/min — ABNORMAL LOW (ref >60)
Glucose, Bld: 121 mg/dL — ABNORMAL HIGH (ref 70–99)
Potassium: 3.6 mmol/L (ref 3.5–5.1)
SODIUM: 140 mmol/L (ref 135–145)
Total Bilirubin: 0.4 mg/dL (ref 0.3–1.2)
Total Protein: 6.4 g/dL — ABNORMAL LOW (ref 6.5–8.1)

## 2018-04-14 LAB — CBC WITH DIFFERENTIAL/PLATELET
Abs Immature Granulocytes: 0.06 10*3/uL (ref 0.00–0.07)
Basophils Absolute: 0.2 10*3/uL — ABNORMAL HIGH (ref 0.0–0.1)
Basophils Relative: 1 %
Eosinophils Absolute: 0.2 10*3/uL (ref 0.0–0.5)
Eosinophils Relative: 2 %
HCT: 32.5 % — ABNORMAL LOW (ref 36.0–46.0)
Hemoglobin: 10.2 g/dL — ABNORMAL LOW (ref 12.0–15.0)
Immature Granulocytes: 0 %
LYMPHS PCT: 15 %
Lymphs Abs: 2.1 10*3/uL (ref 0.7–4.0)
MCH: 25.1 pg — ABNORMAL LOW (ref 26.0–34.0)
MCHC: 31.4 g/dL (ref 30.0–36.0)
MCV: 79.9 fL — ABNORMAL LOW (ref 80.0–100.0)
Monocytes Absolute: 0.3 10*3/uL (ref 0.1–1.0)
Monocytes Relative: 2 %
Neutro Abs: 11.3 10*3/uL — ABNORMAL HIGH (ref 1.7–7.7)
Neutrophils Relative %: 80 %
Platelets: 172 10*3/uL (ref 150–400)
RBC: 4.07 MIL/uL (ref 3.87–5.11)
RDW: 19.3 % — ABNORMAL HIGH (ref 11.5–15.5)
WBC: 14.2 10*3/uL — ABNORMAL HIGH (ref 4.0–10.5)
nRBC: 0.6 % — ABNORMAL HIGH (ref 0.0–0.2)

## 2018-04-14 LAB — I-STAT TROPONIN, ED: Troponin i, poc: 0.05 ng/mL (ref 0.00–0.08)

## 2018-04-14 LAB — I-STAT CG4 LACTIC ACID, ED: Lactic Acid, Venous: 1.56 mmol/L (ref 0.5–1.9)

## 2018-04-14 MED ORDER — SODIUM CHLORIDE 0.9 % IV BOLUS
500.0000 mL | Freq: Once | INTRAVENOUS | Status: AC
  Administered 2018-04-14: 500 mL via INTRAVENOUS

## 2018-04-14 MED ORDER — ACETAMINOPHEN 325 MG PO TABS
650.0000 mg | ORAL_TABLET | Freq: Once | ORAL | Status: AC
  Administered 2018-04-14: 650 mg via ORAL
  Filled 2018-04-14: qty 2

## 2018-04-14 MED ORDER — DILTIAZEM HCL 25 MG/5ML IV SOLN
5.0000 mg | Freq: Once | INTRAVENOUS | Status: AC
  Administered 2018-04-14: 5 mg via INTRAVENOUS
  Filled 2018-04-14: qty 5

## 2018-04-14 MED ORDER — IBUPROFEN 400 MG PO TABS
400.0000 mg | ORAL_TABLET | Freq: Once | ORAL | Status: AC
  Administered 2018-04-14: 400 mg via ORAL
  Filled 2018-04-14: qty 1

## 2018-04-14 NOTE — ED Triage Notes (Signed)
Pt brought in by [facility] for c/o fall; pt hit her life alert button cause she fell in the floor; pt states she has been feeling weak for the last couple of weeks;

## 2018-04-14 NOTE — ED Provider Notes (Signed)
[facility] EMERGENCY DEPARTMENT Provider Note   CSN: [id number] Arrival date & time: 04/14/18  2633     History   Chief Complaint Chief Complaint  Patient presents with  . Fall    HPI [name] is a 84 y.o. female.  Patient brought to the emergency department by ambulance from home.  Patient had a fall tonight.  She thinks she was trying to reach to a bedside table for her water and slipped and fell.  She landed mostly on her knees but was unable to get back up after she fell, activated her life alert button.  Patient reports that she has been feeling very weak for the last couple of weeks.  She has not had any flu symptoms, vomiting, diarrhea, cough, shortness of breath, chest pain.  She reports that her left knee hurts since the fall.     Past Medical History:  Diagnosis Date  . Chronic low back pain   . Degenerative arthritis   . Eczema   . Edema   . Gout   . Headache   . Hiatal hernia   . Hypertension   . Internal hemorrhoids   . Neurogenic claudication   . Obesity   . Seizure disorder ([place])   . Seizures ([place])   . Stroke ([place])   . Urticaria     Patient Active Problem List   Diagnosis Date Noted  . Asthma 02/12/2017  . Chest pain 11/13/2016  . History of seizures 11/13/2016  . History of stroke 11/13/2016  . Allergic urticaria 08/30/2016  . GAD (generalized anxiety disorder) 08/30/2016  . Gout 08/30/2016  . Acute renal failure superimposed on stage 3 chronic kidney disease ([place]) 10/11/2015  . Neurogenic claudication   . Morbid obesity ([place])   . Edema   . Essential hypertension, benign 09/30/2014  . Secondary hyperparathyroidism, renal ([place]) 09/30/2014  . Proteinuria 09/30/2014  . Anemia associated with chronic renal failure 09/30/2014  . Chronic kidney disease (CKD), stage IV (severe) ([place]) 09/30/2014  . Hiatal hernia 08/21/2012    Past Surgical History:  Procedure Laterality Date  . ABDOMINAL HYSTERECTOMY    . APPENDECTOMY    .  APPENDECTOMY    . bilateral knees    . cataracts    . CHOLECYSTECTOMY    . mammoplasty reduction    . NEUROPLASTY / TRANSPOSITION MEDIAN NERVE AT CARPAL TUNNEL BILATERAL    . RE-EXCISION OF BREAST LUMPECTOMY    . TUBAL LIGATION       OB History    Gravida  6   Para  4   Term  4   Preterm      AB  2   Living        SAB  2   TAB      Ectopic      Multiple      Live Births               Home Medications    Prior to Admission medications   Medication Sig Start Date End Date Taking? Authorizing Provider  acetaminophen (TYLENOL) 500 MG tablet Take 1,000 mg by mouth as needed.    [provider]  allopurinol (ZYLOPRIM) 100 MG tablet Take 0.5 tablets (50 mg total) by mouth daily. 11/29/17   [name], [name]  ARNUITY ELLIPTA 200 MCG/ACT AEPB TAKE 1 PUFF BY MOUTH EVERY DAY 03/26/18   Kozlow, Donnamarie Poag, MD  aspirin 81 MG tablet Take 81 mg by mouth daily.  [provider]  escitalopram (LEXAPRO) 5 MG tablet Take 1 tablet (5 mg total) by mouth daily for 14 days, THEN 2 tablets (10 mg total) daily for 14 days. 03/17/18 04/14/18  [name], [name]  famotidine (PEPCID) 40 MG tablet Take 1 tablet (40 mg total) by mouth daily. 03/04/18   Kozlow, Donnamarie Poag, MD  levETIRAcetam (KEPPRA XR) 500 MG 24 hr tablet Take 4 tablets (2,000 mg total) by mouth at bedtime. 03/26/18   Ward Givens, NP  metolazone (ZAROXOLYN) 2.5 MG tablet TAKE 1 TABLET ON MONDAYS, WEDNESDAYS, AND FRIDAYS 10/29/17   Hawks, Christy A, [name]  mometasone (ELOCON) 0.1 % ointment Apply to skin once daily as directed after bathing. Patient not taking: Reported on 03/26/2018 03/04/18   Jiles Prows, MD  montelukast (SINGULAIR) 10 MG tablet TAKE 1 TABLET BY MOUTH EVERYDAY AT BEDTIME 09/10/17   Kozlow, Donnamarie Poag, MD  nystatin (MYCOSTATIN) 100000 UNIT/ML suspension Take 5 mLs (500,000 Units total) by mouth 4 (four) times daily. Patient not taking: Reported on 03/26/2018 03/17/18   [name], [name]    nystatin-triamcinolone ointment [facility]) Apply 1 application topically 2 (two) times daily. Patient not taking: Reported on 03/26/2018 11/16/15   [name] A, [name]  Propylene Glycol (SYSTANE BALANCE OP) Apply 1 drop to eye 2 (two) times daily.    [provider]  triamcinolone cream (KENALOG) 0.1 % Apply 1 application topically 2 (two) times daily. 03/18/18   Kozlow, Donnamarie Poag, MD    Family History Family History  Problem Relation Age of Onset  . Diabetes Mother   . Heart disease Mother   . Heart disease Father   . Cancer Sister   . Cancer Brother   . Cancer Sister     Social History Social History   Tobacco Use  . Smoking status: Never Smoker  . Smokeless tobacco: Never Used  Substance Use Topics  . Alcohol use: No  . Drug use: No     Allergies   Advicor [niacin-lovastatin er]; Caduet [amlodipine-atorvastatin]; Codeine; Dilantin [phenytoin]; Lescol [fluvastatin]; Pamelor [nortriptyline]; Penicillins; Sulfa antibiotics; Trileptal [oxcarbazepine]; Ultram [tramadol]; and Welchol [colesevelam]   Review of Systems Review of Systems  Constitutional: Positive for fatigue.  Neurological: Positive for weakness.  All other systems reviewed and are negative.    Physical Exam Updated Vital Signs BP 111/81   Pulse (!) 125   Temp 97.9 F (36.6 C) (Oral)   Resp (!) 23   Ht 5\' 4"  (1.626 m)   Wt 109 kg   SpO2 97%   BMI 41.25 kg/m   Physical Exam Vitals signs and nursing note reviewed.  Constitutional:      General: She is not in acute distress.    Appearance: Normal appearance. She is well-developed.  HENT:     Head: Normocephalic and atraumatic.     Right Ear: Hearing normal.     Left Ear: Hearing normal.     Nose: Nose normal.  Eyes:     Conjunctiva/sclera: Conjunctivae normal.     Pupils: Pupils are equal, round, and reactive to light.  Neck:     Musculoskeletal: Normal range of motion and neck supple.  Cardiovascular:     Rate and Rhythm: Regular  rhythm. Tachycardia present.     Heart sounds: S1 normal and S2 normal. No murmur. No friction rub. No gallop.   Pulmonary:     Effort: Pulmonary effort is normal. No respiratory distress.     Breath sounds: Normal breath sounds.  Chest:     Chest wall: No tenderness.  Abdominal:     General: Bowel sounds are normal.     Palpations: Abdomen is soft.     Tenderness: There is no abdominal tenderness. There is no guarding or rebound. Negative signs include Murphy's sign and McBurney's sign.     Hernia: No hernia is present.  Musculoskeletal: Normal range of motion.     Left knee: She exhibits normal range of motion, no swelling, no effusion and no deformity. Tenderness found.       Legs:  Skin:    General: Skin is warm and dry.     Findings: No rash.  Neurological:     Mental Status: She is alert and oriented to person, place, and time.     GCS: GCS eye subscore is 4. GCS verbal subscore is 5. GCS motor subscore is 6.     Cranial Nerves: No cranial nerve deficit.     Sensory: No sensory deficit.     Coordination: Coordination normal.  Psychiatric:        Speech: Speech normal.        Behavior: Behavior normal.        Thought Content: Thought content normal.      ED Treatments / Results  Labs (all labs ordered are listed, but only abnormal results are displayed) Labs Reviewed  CBC WITH DIFFERENTIAL/PLATELET - Abnormal; Notable for the following components:      Result Value   WBC 14.2 (*)    Hemoglobin 10.2 (*)    HCT 32.5 (*)    MCV 79.9 (*)    MCH 25.1 (*)    RDW 19.3 (*)    nRBC 0.6 (*)    Neutro Abs 11.3 (*)    Basophils Absolute 0.2 (*)    All other components within normal limits  COMPREHENSIVE METABOLIC PANEL - Abnormal; Notable for the following components:   Glucose, Bld 121 (*)    BUN 24 (*)    Creatinine, Ser 1.53 (*)    Total Protein 6.4 (*)    GFR calc non Af Amer 29 (*)    GFR calc Af Amer 34 (*)    All other components within normal limits    URINALYSIS, ROUTINE W REFLEX MICROSCOPIC  I-STAT CG4 LACTIC ACID, ED  I-STAT TROPONIN, ED    EKG EKG Interpretation  Date/Time:  Monday April 14 2018 04:10:18 EST Ventricular Rate:  129 PR Interval:    QRS Duration: 96 QT Interval:  329 QTC Calculation: 482 R Axis:   14 Text Interpretation:  Ectopic atrial tachycardia Prolonged PR interval Consider left atrial enlargement Low voltage, precordial leads Repolarization abnormality, prob rate related Confirmed by Orpah Greek [phone]) on 04/14/2018 7:10:12 AM   Radiology Dg Chest 2 View  Result Date: 04/14/2018 CLINICAL DATA:  Fall EXAM: CHEST - 2 VIEW COMPARISON:  11/13/2016 FINDINGS: There is shallow lung inflation. The cardiomediastinal contours are normal. There is no focal airspace consolidation or pulmonary edema. There is no pleural effusion or pneumothorax. IMPRESSION: No active cardiopulmonary disease. Electronically Signed   By: Ulyses Jarred M.D.   On: 04/14/2018 05:49   Dg Knee Complete 4 Views Left  Result Date: 04/14/2018 CLINICAL DATA:  Status post fall, with left knee pain. Initial encounter. EXAM: LEFT KNEE - COMPLETE 4+ VIEW COMPARISON:  None. FINDINGS: There is no evidence of fracture or dislocation. The patient's total knee arthroplasty is unremarkable in appearance, without evidence of loosening. No significant joint effusion is  seen. The visualized soft tissues are normal in appearance. IMPRESSION: No evidence of fracture or dislocation. Total knee arthroplasty is unremarkable in appearance. Electronically Signed   By: Garald Balding M.D.   On: 04/14/2018 05:49    Procedures Procedures (including critical care time)  Medications Ordered in ED Medications  sodium chloride 0.9 % bolus 500 mL (500 mLs Intravenous New Bag/Given 04/14/18 0635)  ibuprofen (ADVIL,MOTRIN) tablet 400 mg (has no administration in time range)  acetaminophen (TYLENOL) tablet 650 mg (has no administration in time range)   diltiazem (CARDIZEM) injection 5 mg (5 mg Intravenous Given 04/14/18 0636)     Initial Impression / Assessment and Plan / ED Course  I have reviewed the triage vital signs and the nursing notes.  Pertinent labs & imaging results that were available during my care of the patient were reviewed by me and considered in my medical decision making (see chart for details).     Patient presents to the emergency department for evaluation after a fall.  Patient fell from her bed, landing onto her knees.  She is able to move both hips without difficulty at arrival.  X-ray left knee shows intact prosthesis, no abnormality.  No other evidence of injury on examination.  Patient noted to be tachycardic at arrival.  Patient was 130 bpm, appeared to be an ectopic atrial tachycardia.  Patient given a small dose of Cardizem and converted to sinus rhythm.  No evidence of atrial fibrillation or any malignant arrhythmia that would require any further intervention or treatment.  Final Clinical Impressions(s) / ED Diagnoses   Final diagnoses:  Contusion of left knee, initial encounter    ED Discharge Orders    None       Jeanie Mccard, Gwenyth Allegra, MD 04/14/18 [phone]

## 2018-04-14 NOTE — ED Notes (Signed)
Pt ambulated in hallway with cane with stand-by nursing assist. Pt ambulated with steady gait and without difficulties.

## 2018-04-14 NOTE — ED Notes (Signed)
Checked on pt for urine sample,pt states she went before Ems got there.Will try back in 30 minutes.

## 2018-04-14 NOTE — ED Notes (Signed)
Patient repositioned in the bed.

## 2018-04-22 ENCOUNTER — Other Ambulatory Visit: Payer: Self-pay | Admitting: Family

## 2018-05-02 ENCOUNTER — Other Ambulatory Visit: Payer: Self-pay | Admitting: Family

## 2018-05-06 ENCOUNTER — Other Ambulatory Visit: Payer: Self-pay | Admitting: Allergy and Immunology

## 2018-05-06 ENCOUNTER — Other Ambulatory Visit: Payer: Self-pay | Admitting: Family

## 2018-07-03 ENCOUNTER — Telehealth: Payer: Self-pay | Admitting: Allergy and Immunology

## 2018-07-03 ENCOUNTER — Other Ambulatory Visit: Payer: Self-pay | Admitting: *Deleted

## 2018-07-03 MED ORDER — CETIRIZINE HCL 10 MG PO TABS: 10.0000 mg | ORAL_TABLET | Freq: Every day | ORAL | 5 refills | Status: DC

## 2018-07-03 MED ORDER — MONTELUKAST SODIUM 10 MG PO TABS: ORAL_TABLET | ORAL | 1 refills | Status: DC

## 2018-07-03 MED ORDER — OMEPRAZOLE 40 MG PO CPDR: 40.0000 mg | DELAYED_RELEASE_CAPSULE | Freq: Every day | ORAL | 5 refills | Status: AC

## 2018-07-03 NOTE — Telephone Encounter (Signed)
called pt and reschedule  And needs [facility] Fluticasone, singulair, prilosec, zyrtec [facility] [place] [phone].

## 2018-07-03 NOTE — Telephone Encounter (Signed)
Refills sent in

## 2018-07-08 ENCOUNTER — Ambulatory Visit: Payer: Medicare Other | Admitting: Allergy and Immunology

## 2018-07-14 ENCOUNTER — Ambulatory Visit: Payer: Medicare Other | Admitting: Adult Health

## 2018-08-12 ENCOUNTER — Ambulatory Visit (INDEPENDENT_AMBULATORY_CARE_PROVIDER_SITE_OTHER): Payer: Medicare Other | Admitting: Allergy and Immunology

## 2018-08-12 ENCOUNTER — Encounter: Payer: Self-pay | Admitting: Allergy and Immunology

## 2018-08-12 DIAGNOSIS — D72824 Basophilia: Secondary | ICD-10-CM

## 2018-08-12 DIAGNOSIS — L500 Allergic urticaria: Secondary | ICD-10-CM

## 2018-08-12 DIAGNOSIS — L989 Disorder of the skin and subcutaneous tissue, unspecified: Secondary | ICD-10-CM

## 2018-08-12 DIAGNOSIS — D721 Eosinophilia, unspecified: Secondary | ICD-10-CM

## 2018-08-12 DIAGNOSIS — D729 Disorder of white blood cells, unspecified: Secondary | ICD-10-CM

## 2018-08-12 DIAGNOSIS — L308 Other specified dermatitis: Secondary | ICD-10-CM | POA: Diagnosis not present

## 2018-08-12 DIAGNOSIS — K219 Gastro-esophageal reflux disease without esophagitis: Secondary | ICD-10-CM | POA: Diagnosis not present

## 2018-08-12 DIAGNOSIS — J4530 Mild persistent asthma, uncomplicated: Secondary | ICD-10-CM | POA: Diagnosis not present

## 2018-08-12 NOTE — Patient Instructions (Addendum)
  1. Continue the following:   A. Arnuity 200 - one inhalation one time per day  B. Cetirizine 10mg  - one tablet two times per day  C. Montelukast 10mg  - one tablet one time per day  D. Famotidine 40 mg - 1 tablet 1 time per day  E. Omeprazole 40 mg - one tablet one time per day  F. Triamcinolone 0.1% cream applied to skin after shower  2. Albuterol nebulization or Proventil HFA IF NEEDED  3. Return to clinic in 3 months or earlier if problem   4. Obtain blood: FISH for CML, Jak2 with reflex including CALR.

## 2018-08-12 NOTE — Progress Notes (Signed)
[facility]   Follow-up Note  Referring Provider: [name], [name] Primary Provider: [name], [name] Date of Office Visit: 08/12/2018  Subjective:   [name] (DOB: [birth year]) is a 85 y.o. female who returns to the [facility] on 08/12/2018 in re-evaluation of the following:  HPI: This is a E - Med visit requested by patient who is located at home.  [name] is followed in this clinic for a inflammatory pruritic dermatitis, asthma, and reflux induced respiratory disease.  Her last visit to this clinic was 04 March 2018.  Daily itching. Using benadryl plus prescribed medications but not using cetirizine.  Lungs good. No coughing. Using Arnuity. No SABA use. No need for systemic steroids or antibiotics to treat any type of respiratory issue.  Reflux good.  No throat issues.  Using omeprazole and may be using famotidine on occasion  Allergies as of 08/12/2018      Reactions   Advicor [niacin-lovastatin Er] Other (See Comments)   unknown   Caduet [amlodipine-atorvastatin] Other (See Comments)   unknown   Codeine Other (See Comments)   unknown   Dilantin [phenytoin] Other (See Comments)   unknown   Lescol [fluvastatin] Other (See Comments)   Pamelor [nortriptyline] Other (See Comments)   unknown   Penicillins Other (See Comments)   unknown   Sulfa Antibiotics Other (See Comments)   unknown   Trileptal [oxcarbazepine] Other (See Comments)   unknown   Ultram [tramadol] Other (See Comments)   unknown   Welchol [colesevelam] Other (See Comments)   unknown      Medication List      acetaminophen 500 MG tablet Commonly known as:  TYLENOL Take 1,000 mg by mouth as needed.   allopurinol 100 MG tablet Commonly known as:  ZYLOPRIM TAKE 1/2 TABLET BY MOUTH DAILY   Arnuity Ellipta 200 MCG/ACT Aepb Generic drug:  Fluticasone Furoate TAKE 1 PUFF BY MOUTH EVERY DAY   aspirin 81 MG tablet Take 81  mg by mouth daily.   cetirizine 10 MG tablet Commonly known as:  ZYRTEC Take 1 tablet (10 mg total) by mouth daily.   escitalopram 5 MG tablet Commonly known as:  LEXAPRO Take 1 tablet (5 mg total) by mouth daily for 14 days, THEN 2 tablets (10 mg total) daily for 14 days. Start taking on:  March 17, 2018   famotidine 40 MG tablet Commonly known as:  Pepcid Take 1 tablet (40 mg total) by mouth daily.   levETIRAcetam 500 MG 24 hr tablet Commonly known as:  KEPPRA XR Take 4 tablets (2,000 mg total) by mouth at bedtime.   metolazone 2.5 MG tablet Commonly known as:  ZAROXOLYN TAKE 1 TABLET BY MOUTH ON MONDAYS, WEDNESDAYS, AND FRIDAYS   mometasone 0.1 % ointment Commonly known as:  ELOCON Apply to skin once daily as directed after bathing.   montelukast 10 MG tablet Commonly known as:  SINGULAIR Take one tablet daily at bedtime.   nystatin 100000 UNIT/ML suspension Commonly known as:  MYCOSTATIN TAKE 5 MLS (500,000 UNITS TOTAL) BY MOUTH 4 (FOUR) TIMES DAILY.   nystatin-triamcinolone ointment Commonly known as:  MYCOLOG Apply 1 application topically 2 (two) times daily.   omeprazole 40 MG capsule Commonly known as:  PriLOSEC Take 1 capsule (40 mg total) by mouth daily.   SYSTANE BALANCE OP Apply 1 drop to eye 2 (two) times daily.   triamcinolone cream 0.1 % Commonly known as:  KENALOG Apply 1 application topically 2 (two) times daily.       Past Medical History:  Diagnosis Date  . Chronic low back pain   . Degenerative arthritis   . Eczema   . Edema   . Gout   . Headache   . Hiatal hernia   . Hypertension   . Internal hemorrhoids   . Neurogenic claudication   . Obesity   . Seizure disorder ([place])   . Seizures ([place])   . Stroke ([place])   . Urticaria     Past Surgical History:  Procedure Laterality Date  . ABDOMINAL HYSTERECTOMY    . APPENDECTOMY    . APPENDECTOMY    . bilateral knees    . cataracts    . CHOLECYSTECTOMY    . mammoplasty reduction     . NEUROPLASTY / TRANSPOSITION MEDIAN NERVE AT CARPAL TUNNEL BILATERAL    . RE-EXCISION OF BREAST LUMPECTOMY    . TUBAL LIGATION      Review of systems negative except as noted in HPI / PMHx or noted below:  Review of Systems  Constitutional: Negative.   HENT: Negative.   Eyes: Negative.   Respiratory: Negative.   Cardiovascular: Negative.   Gastrointestinal: Negative.   Genitourinary: Negative.   Musculoskeletal: Negative.   Skin: Negative.   Neurological: Negative.   Endo/Heme/Allergies: Negative.   Psychiatric/Behavioral: Negative.      Objective:   There were no vitals filed for this visit.        Physical Exam-deferred  Diagnostics: Results of blood tests obtained 14 April 2018 identifies WBC 14.2, absolute eosinophil 200, absolute basophil 200, absolute lymphocyte 2100, absolute neutrophil 11,300, hemoglobin 10.2, platelet 172  Assessment and Plan:   1. Asthma, well controlled, mild persistent   2. Allergic urticaria   3. Inflammatory dermatosis   4. Gastroesophageal reflux disease, esophagitis presence not specified   5. Basophilia   6. Neutrophilia   7. Eosinophilia     1. Continue the following:   A. Arnuity 200 - one inhalation one time per day  B. Cetirizine 10mg  - one tablet two times per day  C. Montelukast 10mg  - one tablet one time per day  D. Famotidine 40 mg - 1 tablet 1 time per day  E. Omeprazole 40 mg - one tablet one time per day  F. Triamcinolone 0.1% cream applied to skin after shower  2. Albuterol nebulization or Proventil HFA IF NEEDED  3. Return to clinic in 3 months or earlier if problem   4. Obtain blood: FISH for CML, Jak2 with reflex including CALR.   [name] will add in cetirizine utilized at 10 to 20 mg daily in addition to her topical agents to see if this helps with her pruritic inflammatory condition.  She will maintain anti-inflammatory agents for her airway including the use of an inhaled steroid and a leukotriene  modifier.  She will continue to treat her reflux with a combination of omeprazole and famotidine.  Assuming she does well I will see her back in this clinic in 3 months or earlier if there is a problem.  She does have laboratory abnormalities as noted above including neutrophilia, basophilia, eosinophilia, and a microcytic anemia.  With her pruritic disorder this always raises the possibility that she might have a myeloproliferative disease including CML.  I will obtain screening blood tests to look for these disease states.  Total patient interaction time 25 minutes.  Allena Katz, MD Allergy / Immunology [facility]

## 2018-08-13 ENCOUNTER — Encounter: Payer: Self-pay | Admitting: Allergy and Immunology

## 2018-08-13 NOTE — Progress Notes (Signed)
Patient is at home. Provider is in office. Start Time: 202p End time: 236p

## 2018-08-18 ENCOUNTER — Other Ambulatory Visit: Payer: Self-pay | Admitting: Allergy and Immunology

## 2018-09-15 ENCOUNTER — Other Ambulatory Visit: Payer: Self-pay

## 2018-09-15 ENCOUNTER — Encounter (INDEPENDENT_AMBULATORY_CARE_PROVIDER_SITE_OTHER): Payer: Self-pay

## 2018-09-15 ENCOUNTER — Telehealth: Payer: Self-pay

## 2018-09-15 NOTE — Telephone Encounter (Signed)
Patient was called to be be reminded of her appointment tomorrow and it was changed due to provider working remote . Patient was upset stating she felt like she could take her life due to her painful rash.  Patient thought she was going to come in tomorrow to get her rash looked at and was upset since she was not.  Schedule patient with Dr. Darnell Level for rash only and told patient she would have to re schedule her 6 month follow up with christy.  Patient states that she said she would take her life because she was upset and states she is fine now and does not have a plan to hurt herself.  States she was just in pain so she said it.

## 2018-09-16 ENCOUNTER — Ambulatory Visit: Payer: Medicare Other | Admitting: Family Medicine

## 2018-09-16 ENCOUNTER — Other Ambulatory Visit: Payer: Self-pay

## 2018-09-16 ENCOUNTER — Ambulatory Visit: Payer: Medicare Other | Admitting: Family

## 2018-09-16 VITALS — BP 132/79 | HR 91 | Temp 98.8°F | Ht 64.0 in | Wt 242.0 lb

## 2018-09-16 DIAGNOSIS — B372 Candidiasis of skin and nail: Secondary | ICD-10-CM | POA: Diagnosis not present

## 2018-09-16 MED ORDER — KETOCONAZOLE 2 % EX CREA: TOPICAL_CREAM | CUTANEOUS | 3 refills | Status: DC

## 2018-09-16 NOTE — Progress Notes (Signed)
Subjective: CC: Rash PCP: [name], [name] CWC:[name] is a 85 y.o. female presenting to clinic today for:  1. Rash Patient is accompanied by her daughter who notes that she has had a several week history of rash that is itchy underneath bilateral breasts, underneath her belly and in her groin region.  No skin breakdown or exudates.  Nontender.  They have been applying triamcinolone cream to the affected areas because they thought it might be eczema, which patient has a history of.  She reports that these symptoms seem to recur every year when it becomes warm.   ROS: Per HPI  Allergies  Allergen Reactions  . Advicor [Niacin-Lovastatin Er] Other (See Comments)    unknown  . Caduet [Amlodipine-Atorvastatin] Other (See Comments)    unknown  . Codeine Other (See Comments)    unknown  . Dilantin [Phenytoin] Other (See Comments)    unknown  . Lescol [Fluvastatin] Other (See Comments)  . Pamelor [Nortriptyline] Other (See Comments)    unknown  . Penicillins Other (See Comments)    unknown  . Sulfa Antibiotics Other (See Comments)    unknown  . Trileptal [Oxcarbazepine] Other (See Comments)    unknown  . Ultram [Tramadol] Other (See Comments)    unknown  . Welchol [Colesevelam] Other (See Comments)    unknown   Past Medical History:  Diagnosis Date  . Chronic low back pain   . Degenerative arthritis   . Eczema   . Edema   . Gout   . Headache   . Hiatal hernia   . Hypertension   . Internal hemorrhoids   . Neurogenic claudication   . Obesity   . Seizure disorder ([place])   . Seizures ([place])   . Stroke ([place])   . Urticaria     Current Outpatient Medications:  .  acetaminophen (TYLENOL) 500 MG tablet, Take 1,000 mg by mouth as needed., Disp: , Rfl:  .  allopurinol (ZYLOPRIM) 100 MG tablet, TAKE 1/2 TABLET BY MOUTH DAILY, Disp: 45 tablet, Rfl: 1 .  ARNUITY ELLIPTA 200 MCG/ACT AEPB, TAKE 1 PUFF BY MOUTH EVERY DAY, Disp: 30 each, Rfl: 5 .  aspirin 81 MG tablet,  Take 81 mg by mouth daily., Disp: , Rfl:  .  cetirizine (ZYRTEC) 10 MG tablet, Take 1 tablet (10 mg total) by mouth daily., Disp: 30 tablet, Rfl: 5 .  escitalopram (LEXAPRO) 5 MG tablet, Take 1 tablet (5 mg total) by mouth daily for 14 days, THEN 2 tablets (10 mg total) daily for 14 days., Disp: 90 tablet, Rfl: 1 .  famotidine (PEPCID) 40 MG tablet, TAKE 1 TABLET BY MOUTH EVERY DAY, Disp: 90 tablet, Rfl: 1 .  levETIRAcetam (KEPPRA XR) 500 MG 24 hr tablet, Take 4 tablets (2,000 mg total) by mouth at bedtime., Disp: 360 tablet, Rfl: 3 .  metolazone (ZAROXOLYN) 2.5 MG tablet, TAKE 1 TABLET BY MOUTH ON MONDAYS, WEDNESDAYS, AND FRIDAYS, Disp: 36 tablet, Rfl: 1 .  mometasone (ELOCON) 0.1 % ointment, Apply to skin once daily as directed after bathing., Disp: 90 g, Rfl: 3 .  montelukast (SINGULAIR) 10 MG tablet, Take one tablet daily at bedtime., Disp: 90 tablet, Rfl: 1 .  nystatin (MYCOSTATIN) 100000 UNIT/ML suspension, TAKE 5 MLS (500,000 UNITS TOTAL) BY MOUTH 4 (FOUR) TIMES DAILY., Disp: 473 mL, Rfl: 0 .  nystatin-triamcinolone ointment (MYCOLOG), Apply 1 application topically 2 (two) times daily., Disp: 90 g, Rfl: 1 .  omeprazole (PRILOSEC) 40 MG capsule, Take 1 capsule (  40 mg total) by mouth daily., Disp: 30 capsule, Rfl: 5 .  Propylene Glycol (SYSTANE BALANCE OP), Apply 1 drop to eye 2 (two) times daily., Disp: , Rfl:  .  triamcinolone cream (KENALOG) 0.1 %, Apply 1 application topically 2 (two) times daily., Disp: 453.6 g, Rfl: 2 Social History   Socioeconomic History  . Marital status: Widowed    Spouse name: Not on file  . Number of children: 4  . Years of education: Not on file  . Highest education level: Not on file  Occupational History  . Occupation: Pharmacist, hospital    Comment: retired  Scientific laboratory technician  . Financial resource strain: Not hard at all  . Food insecurity:    Worry: Never true    Inability: Never true  . Transportation needs:    Medical: No    Non-medical: No  Tobacco Use  .  Smoking status: Never Smoker  . Smokeless tobacco: Never Used  Substance and Sexual Activity  . Alcohol use: No  . Drug use: No  . Sexual activity: Not Currently  Lifestyle  . Physical activity:    Days per week: 0 days    Minutes per session: 0 min  . Stress: Only a little  Relationships  . Social connections:    Talks on phone: More than three times a week    Gets together: More than three times a week    Attends religious service: More than 4 times per year    Active member of club or organization: Yes    Attends meetings of clubs or organizations: More than 4 times per year    Relationship status: Widowed  . Intimate partner violence:    Fear of current or ex partner: No    Emotionally abused: No    Physically abused: No    Forced sexual activity: No  Other Topics Concern  . Not on file  Social History Narrative   Patient is right handed.   Patient drinks some caffeine daily.   Family History  Problem Relation Age of Onset  . Diabetes Mother   . Heart disease Mother   . Heart disease Father   . Cancer Sister   . Cancer Brother   . Cancer Sister     Objective: Office vital signs reviewed. BP 132/79   Pulse 91   Temp 98.8 F (37.1 C) (Oral)   Ht 5\' 4"  (1.626 m)   Wt 242 lb (109.8 kg)   BMI 41.54 kg/m   Physical Examination:  General: Awake, alert, obese, No acute distress Skin: Shiny areas of hyperpigmentation noted underneath bilateral breasts and underneath pannus.  There is no skin breakdown, tenderness to palpation or exudates.  Assessment/ Plan: 85 y.o. female   1. Candidal intertrigo Consistent with candidal intertrigo.  Start ketoconazole cream applied to the affected areas once daily.  Anticipate she will need this for at least the next 2 to 4 weeks.  We discussed that if she starts developing weeping lesions that we could consider switching to the nystatin powder instead.  We discussed keeping areas as dry as possible.  If she develops any signs  or symptoms of infection she is to contact the office immediately at which point we need to consider initiation of antibiotics. - ketoconazole (NIZORAL) 2 % cream; Apply to the affected areas underneath breasts, belly and in groin region ONCE daily x2-4 weeks.  Dispense: 60 g; Refill: 3   No orders of the defined types were placed in this encounter.  No orders of the defined types were placed in this encounter.    [name] Norlander, DO [facility] [phone]

## 2018-09-16 NOTE — Patient Instructions (Signed)
Intertrigo  Intertrigo is skin irritation or inflammation (dermatitis) that occurs when folds of skin rub together. The irritation can cause a rash and make skin raw and itchy. This condition most commonly occurs in the skin folds of these areas:  · Toes.  · Armpits.  · Groin.  · Under the belly.  · Under the breasts.  · Buttocks.  Intertrigo is not passed from person to person (is not contagious).  What are the causes?  This condition is caused by heat, moisture, rubbing (friction), and not enough air circulation. The condition can be made worse by:  · Sweat.  · Bacteria.  · A fungus, such as yeast.  What increases the risk?  This condition is more likely to occur if you have moisture in your skin folds. You are more likely to develop this condition if you:  · Have diabetes.  · Are overweight.  · Are not able to move around or are not active.  · Live in a warm and moist climate.  · Wear splints, braces, or other medical devices.  · Are not able to control your bowels or bladder (have incontinence).  What are the signs or symptoms?  Symptoms of this condition include:  · A pink or red skin rash in the skin fold or near the skin fold.  · Raw or scaly skin.  · Itchiness.  · A burning feeling.  · Bleeding.  · Leaking fluid.  · A bad smell.  How is this diagnosed?  This condition is diagnosed with a medical history and physical exam. You may also have a skin swab to test for bacteria or a fungus.  How is this treated?  This condition may be treated by:  · Cleaning and drying your skin.  · Taking an antibiotic medicine or using an antibiotic skin cream for a bacterial infection.  · Using an antifungal cream on your skin or taking pills for an infection that was caused by a fungus, such as yeast.  · Using a steroid ointment to relieve itchiness and irritation.  · Separating the skin fold with a clean cotton cloth to absorb moisture and allow air to flow into the area.  Follow these instructions at home:  · Keep the  affected area clean and dry.  · Do not scratch your skin.  · Stay in a cool environment as much as possible. Use an air conditioner or fan, if available.  · Apply over-the-counter and prescription medicines only as told by your health care provider.  · If you were prescribed an antibiotic medicine, use it as told by your health care provider. Do not stop using the antibiotic even if your condition improves.  · Keep all follow-up visits as told by your health care provider. This is important.  How is this prevented?    · Maintain a healthy weight.  · Take care of your feet, especially if you have diabetes. Foot care includes:  ? Wearing shoes that fit well.  ? Keeping your feet dry.  ? Wearing clean, breathable socks.  · Protect the skin around your groin and buttocks, especially if you have incontinence. Skin protection includes:  ? Following a regular cleaning routine.  ? Using skin protectant creams, powders, or ointments.  ? Changing protection pads frequently.  · Do not wear tight clothes. Wear clothes that are loose, absorbent, and made of cotton.  · Wear a bra that gives good support, if needed.  · Shower and dry yourself well   after activity or exercise. Use a hair dryer on a cool setting to dry between skin folds, especially after you bathe.  · If you have diabetes, keep your blood sugar under control.  Contact a health care provider if:  · Your symptoms do not improve with treatment.  · Your symptoms get worse or they spread.  · You notice increased redness and warmth.  · You have a fever.  Summary  · Intertrigo is skin irritation or inflammation (dermatitis) that occurs when folds of skin rub together.  · This condition is caused by heat, moisture, rubbing (friction), and not enough air circulation.  · This condition may be treated by cleaning and drying your skin and with medicines.  · Apply over-the-counter and prescription medicines only as told by your health care provider.  · Keep all follow-up visits  as told by your health care provider. This is important.  This information is not intended to replace advice given to you by your health care provider. Make sure you discuss any questions you have with your health care provider.  Document Released: 04/02/2005 Document Revised: 09/02/2017 Document Reviewed: 09/02/2017  [employer]vier Interactive Patient Education © 2019 [employer]

## 2018-10-08 ENCOUNTER — Other Ambulatory Visit: Payer: Self-pay | Admitting: Allergy and Immunology

## 2018-10-30 ENCOUNTER — Other Ambulatory Visit: Payer: Self-pay | Admitting: Family

## 2018-11-03 NOTE — Progress Notes (Signed)
PATIENT: [name] DOB: 10-03-1923  REASON FOR VISIT: follow up HISTORY FROM: patient  HISTORY OF PRESENT ILLNESS: Today 11/04/18 [name] is a 85 year old female with history of partial complex type seizures.  She had 2 seizure events in October 2019, and was taking Keppra inconsistently.  She lives alone.  She does manage her medications independently.  She fixes a weekly pillbox.  Her daughter indicates that since her last visit she has had seizures.  She says in the past her mother will follow with her seizures.  Fortunately this type of seizure has not occurred.  She reports twice she has been on the phone with her mother, and her speech has gotten mumbled for a few moments, then she will return back to normal or hang up on her daughter.  Another time, she was visiting with her mother and she stared off.  Apparently these are her typical seizure presentation in the past.  She indicates she has been compliant taking Keppra extended release 2000 mg at bedtime.  In the past she has been taking phenobarbital and Keppra, but the patient took herself off phenobarbital and has remained on Keppra alone since 2018.  She presents today for follow-up accompanied by her daughter.  HISTORY  03/26/2018 MM: [name] is a 85 year old female with a history of partial complex type seizures.  She returns today for follow-up.  She states that she had 2 events in October.  These were unwitnessed events but she was found on the floor.  She states with both events she had to call EMS to help her get up.  She did refuse to go to the hospital.  She states that she is having some left arm pain from those falls but she has followed with her primary care provider.  She is unsure if these were seizure events.  Her daughter does state that during that time she was running out of her Keppra but did not make her daughter aware.  The patient does use a pillbox for her morning medications but does not use a pillbox for  her nighttime medications.  She typically takes Keppra at bedtime.  She returns today for evaluation.  REVIEW OF SYSTEMS: Out of a complete 14 system review of symptoms, the patient complains only of the following symptoms, and all other reviewed systems are negative.  Seizures  ALLERGIES: Allergies  Allergen Reactions  . Advicor [Niacin-Lovastatin Er] Other (See Comments)    unknown  . Caduet [Amlodipine-Atorvastatin] Other (See Comments)    unknown  . Codeine Other (See Comments)    unknown  . Dilantin [Phenytoin] Other (See Comments)    unknown  . Lescol [Fluvastatin] Other (See Comments)  . Pamelor [Nortriptyline] Other (See Comments)    unknown  . Penicillins Other (See Comments)    unknown  . Sulfa Antibiotics Other (See Comments)    unknown  . Trileptal [Oxcarbazepine] Other (See Comments)    unknown  . Ultram [Tramadol] Other (See Comments)    unknown  . Welchol [Colesevelam] Other (See Comments)    unknown    HOME MEDICATIONS: Outpatient Medications Prior to Visit  Medication Sig Dispense Refill  . acetaminophen (TYLENOL) 500 MG tablet Take 1,000 mg by mouth as needed.    Marland Kitchen allopurinol (ZYLOPRIM) 100 MG tablet TAKE 1/2 TABLET BY MOUTH EVERY DAY 45 tablet 1  . ARNUITY ELLIPTA 200 MCG/ACT AEPB INHALE 1 PUFF BY MOUTH EVERY DAY 30 each 3  . aspirin 81 MG tablet Take  81 mg by mouth daily.    . famotidine (PEPCID) 40 MG tablet TAKE 1 TABLET BY MOUTH EVERY DAY 90 tablet 1  . ketoconazole (NIZORAL) 2 % cream Apply to the affected areas underneath breasts, belly and in groin region ONCE daily x2-4 weeks. 60 g 3  . levETIRAcetam (KEPPRA XR) 500 MG 24 hr tablet Take 4 tablets (2,000 mg total) by mouth at bedtime. 360 tablet 3  . metolazone (ZAROXOLYN) 2.5 MG tablet TAKE 1 TABLET BY MOUTH ON MONDAYS, WEDNESDAYS, AND FRIDAYS 36 tablet 1  . mometasone (ELOCON) 0.1 % ointment Apply to skin once daily as directed after bathing. 90 g 3  . nystatin-triamcinolone ointment (MYCOLOG)  Apply 1 application topically 2 (two) times daily. 90 g 1  . omeprazole (PRILOSEC) 40 MG capsule Take 1 capsule (40 mg total) by mouth daily. 30 capsule 5  . Propylene Glycol (SYSTANE BALANCE OP) Apply 1 drop to eye 2 (two) times daily.    Marland Kitchen triamcinolone cream (KENALOG) 0.1 % Apply 1 application topically 2 (two) times daily. 453.6 g 2  . escitalopram (LEXAPRO) 5 MG tablet Take 1 tablet (5 mg total) by mouth daily for 14 days, THEN 2 tablets (10 mg total) daily for 14 days. 90 tablet 1   No facility-administered medications prior to visit.     PAST MEDICAL HISTORY: Past Medical History:  Diagnosis Date  . Chronic low back pain   . Degenerative arthritis   . Eczema   . Edema   . Gout   . Headache   . Hiatal hernia   . Hypertension   . Internal hemorrhoids   . Neurogenic claudication   . Obesity   . Seizure disorder ([place])   . Seizures ([place])   . Stroke ([place])   . Urticaria     PAST SURGICAL HISTORY: Past Surgical History:  Procedure Laterality Date  . ABDOMINAL HYSTERECTOMY    . APPENDECTOMY    . APPENDECTOMY    . bilateral knees    . cataracts    . CHOLECYSTECTOMY    . mammoplasty reduction    . NEUROPLASTY / TRANSPOSITION MEDIAN NERVE AT CARPAL TUNNEL BILATERAL    . RE-EXCISION OF BREAST LUMPECTOMY    . TUBAL LIGATION      FAMILY HISTORY: Family History  Problem Relation Age of Onset  . Diabetes Mother   . Heart disease Mother   . Heart disease Father   . Cancer Sister   . Cancer Brother   . Cancer Sister     SOCIAL HISTORY: Social History   Socioeconomic History  . Marital status: Widowed    Spouse name: Not on file  . Number of children: 4  . Years of education: Not on file  . Highest education level: Not on file  Occupational History  . Occupation: Pharmacist, hospital    Comment: retired  Scientific laboratory technician  . Financial resource strain: Not hard at all  . Food insecurity    Worry: Never true    Inability: Never true  . Transportation needs    Medical: No     Non-medical: No  Tobacco Use  . Smoking status: Never Smoker  . Smokeless tobacco: Never Used  Substance and Sexual Activity  . Alcohol use: No  . Drug use: No  . Sexual activity: Not Currently  Lifestyle  . Physical activity    Days per week: 0 days    Minutes per session: 0 min  . Stress: Only a little  Relationships  .  Social connections    Talks on phone: More than three times a week    Gets together: More than three times a week    Attends religious service: More than 4 times per year    Active member of club or organization: Yes    Attends meetings of clubs or organizations: More than 4 times per year    Relationship status: Widowed  . Intimate partner violence    Fear of current or ex partner: No    Emotionally abused: No    Physically abused: No    Forced sexual activity: No  Other Topics Concern  . Not on file  Social History Narrative   Patient is right handed.   Patient drinks some caffeine daily.      PHYSICAL EXAM  Vitals:   11/04/18 0952  BP: (!) 158/94  Pulse: 96  Temp: (!) 97.5 F (36.4 C)  Weight: 237 lb 9.6 oz (107.8 kg)  Height: 5\' 4"  (1.626 m)   Body mass index is 40.78 kg/m.  Generalized: Well developed, in no acute distress   Neurological examination  Mentation: Alert oriented to time, place, history taking. Follows all commands speech and language fluent Cranial nerve II-XII: Pupils were equal round reactive to light. Extraocular movements were full, visual field were full on confrontational test. Facial sensation and strength were normal. Uvula tongue midline. Head turning and shoulder shrug  were normal and symmetric. Motor: The motor testing reveals 5 over 5 strength of all 4 extremities. Good symmetric motor tone is noted throughout.  Sensory: Sensory testing is intact to soft touch on all 4 extremities. No evidence of extinction is noted.  Coordination: Cerebellar testing reveals good finger-nose-finger and heel-to-shin bilaterally.   Gait and station: Gait is unsteady, uses a cane, tandem gait unsteady Reflexes: Deep tendon reflexes are symmetric but decreased bilaterally  DIAGNOSTIC DATA (LABS, IMAGING, TESTING) - I reviewed patient records, labs, notes, testing and imaging myself where available.  Lab Results  Component Value Date   WBC 14.2 (H) 04/14/2018   HGB 10.2 (L) 04/14/2018   HCT 32.5 (L) 04/14/2018   MCV 79.9 (L) 04/14/2018   PLT 172 04/14/2018      Component Value Date/Time   NA 140 04/14/2018 0451   NA 142 03/17/2018 1051   K 3.6 04/14/2018 0451   CL 106 04/14/2018 0451   CO2 27 04/14/2018 0451   GLUCOSE 121 (H) 04/14/2018 0451   BUN 24 (H) 04/14/2018 0451   BUN 25 03/17/2018 1051   CREATININE 1.53 (H) 04/14/2018 0451   CALCIUM 9.8 04/14/2018 0451   PROT 6.4 (L) 04/14/2018 0451   PROT 7.0 03/17/2018 1051   ALBUMIN 3.5 04/14/2018 0451   ALBUMIN 4.2 03/17/2018 1051   AST 28 04/14/2018 0451   ALT 19 04/14/2018 0451   ALKPHOS 43 04/14/2018 0451   BILITOT 0.4 04/14/2018 0451   BILITOT 0.4 03/17/2018 1051   GFRNONAA 29 (L) 04/14/2018 0451   GFRAA 34 (L) 04/14/2018 0451   Lab Results  Component Value Date   CHOL 172 08/04/2015   HDL 38 (L) 08/04/2015   LDLCALC 83 08/04/2015   TRIG 255 (H) 08/04/2015   CHOLHDL 4.5 (H) 08/04/2015   Lab Results  Component Value Date   HGBA1C 5.8% 01/07/2013   Lab Results  Component Value Date   VITAMINB12 1,882 (H) 03/17/2018   Lab Results  Component Value Date   TSH 1.740 08/01/2016      ASSESSMENT AND PLAN 85 y.o. year old  female  has a past medical history of Chronic low back pain, Degenerative arthritis, Eczema, Edema, Gout, Headache, Hiatal hernia, Hypertension, Internal hemorrhoids, Neurogenic claudication, Obesity, Seizure disorder ([place]), Seizures ([place]), Stroke ([place]), and Urticaria. here with:  1.  Seizures  The patient remains on Keppra extended release 2000 mg at bedtime.  She reports since her last visit she has had 3 seizure events.  Two events occurred while on the phone with her daugher, her speech became mumbled for a brief time, then returned to normal, one time her daughter was visiting with her and noticed that she stared off for a few seconds.  In 2018, she was taking phenobarbital and Keppra.  Apparently, she took herself off the phenobarbital and has remained on Keppra alone.  She has done well, up until May 2019 with report of seizures.  At that time, she had missed some doses of her Keppra.  Today, I will check a Keppra level.  We could potentially go up on her Keppra or may need to consider re-introducing phenobarbital.  On 09/12/2017 her Keppra XR was increased to 2000 mg at bedtime.  She will follow-up in 6 months or sooner if needed. Once Keppra level returns, will go up on the medication. If seizures continue we will likely need to start a second agent.   I spent 15 minutes with the patient. 50% of this time was spent discussing her plan of care.    Butler Denmark, AGNP-C, DNP 11/04/2018, 12:01 PM [facility] [address], [place], [place] [address] [phone]

## 2018-11-04 ENCOUNTER — Ambulatory Visit: Payer: Medicare Other | Admitting: Neurology

## 2018-11-04 ENCOUNTER — Ambulatory Visit: Payer: Medicare Other | Admitting: Adult Health

## 2018-11-04 ENCOUNTER — Other Ambulatory Visit: Payer: Self-pay

## 2018-11-04 ENCOUNTER — Encounter: Payer: Self-pay | Admitting: Neurology

## 2018-11-04 VITALS — BP 158/94 | HR 96 | Temp 97.5°F | Ht 64.0 in | Wt 237.6 lb

## 2018-11-04 DIAGNOSIS — Z87898 Personal history of other specified conditions: Secondary | ICD-10-CM

## 2018-11-04 NOTE — Patient Instructions (Signed)
It was wonderful to you today!

## 2018-11-04 NOTE — Progress Notes (Signed)
I have read the note, and I agree with the clinical assessment and plan.  Majid Mccravy K Apollo Timothy   

## 2018-11-07 LAB — LEVETIRACETAM LEVEL: Levetiracetam Lvl: 104.6 ug/mL — ABNORMAL HIGH (ref 10.0–40.0)

## 2018-11-10 ENCOUNTER — Encounter: Payer: Self-pay | Admitting: *Deleted

## 2018-11-10 ENCOUNTER — Telehealth: Payer: Self-pay | Admitting: Neurology

## 2018-11-10 MED ORDER — LAMOTRIGINE 25 MG PO TABS: ORAL_TABLET | ORAL | 0 refills | Status: DC

## 2018-11-10 MED ORDER — LEVETIRACETAM ER 500 MG PO TB24: 2000.0000 mg | ORAL_TABLET | Freq: Every day | ORAL | 3 refills | Status: DC

## 2018-11-10 NOTE — Telephone Encounter (Signed)
I sent letter with lamictal titration instructions.

## 2018-11-10 NOTE — Telephone Encounter (Signed)
I called the patient and talked with her and her daughter, [name].  She has history of partial complex type seizures.  At last visit, the patient and her daughter indicate that she has had a few seizures since last visit. They described them as she will be on the phone, the daughter, her speech will get mumbled for a few moments, then she will return back to normal or hang up on her daughter.  Another time, she was visiting with her daughter, and she stared off.  In the past this is been typical of her seizure events.  At current, she remains on Keppra extended release 2000 mg at bedtime.  She was taking phenobarbital in combination with Keppra in 2018.  During that time, the patient took herself off phenobarbital and has remained on Keppra alone.  I checked a Keppra level, it was quite elevated at 104.6.  At her last lab check in December 2019, her creatinine was 1.47, GFR 35.   Due to the report of continued seizure events, she will need to start a second agent for seizure prevention. Her Keppra level is too high to go up on the medication. She will start Lamictal 25 mg tablets, with a slow titration over 6 weeks up to 100 mg twice daily.   She will start taking Lamictal 25 mg tablets... -Take 1 tablet twice a day for 2 weeks -Take 2 tablets twice a day for 2 weeks -Take 3 tablets twice a day for 2 weeks -Call for any prescription, will then start 100 mg twice daily  I reviewed the instructions with her daughter who will assist with the transition. I will fax the prescription to [place], [place]. We reviewed side effects.   Please call her daughter and have her come back in 4 months for revisit. Can we also mail her instructions for Lamictal titration, since AVS did not contain the new information.

## 2018-11-10 NOTE — Telephone Encounter (Addendum)
Fax confirmation failed [facility] [phone].(multiple times).  Called pharmacy spoke to pharmacist, Lamar Blinks [phone], she stated fax out for last week.  I verbally gave her order for lamotrigine 25mg  (take 1 tab po BID x 2wks, then 2 tab BID for 2wks, then 3 tabs BID for 2 wks, then call for 100mg  tablet (BID).  #[phone], no refill.

## 2018-11-11 NOTE — Telephone Encounter (Signed)
I called pts daughter, [name].  I relayed that needed to change appt to 4 mo RV.  Done.  Cancelled the 04/2019 appt.  Verbal order for medication (lamotrigine) is now at pharmacy.  She will start when picks up.  She will call back as needed.

## 2018-11-12 ENCOUNTER — Other Ambulatory Visit: Payer: Self-pay | Admitting: Allergy and Immunology

## 2018-11-12 NOTE — Telephone Encounter (Signed)
Montelukast was discontinued on 07/03/2018

## 2018-11-12 NOTE — Telephone Encounter (Signed)
Please renew montelukast 10 mg daily.  Please have patient recheck CBC w/diff as she had neutrophilia and basophilia on her last blood check.

## 2018-11-13 NOTE — Telephone Encounter (Signed)
Sent in refill per Dr Neldon Mc

## 2018-12-17 ENCOUNTER — Telehealth: Payer: Self-pay | Admitting: Neurology

## 2018-12-17 MED ORDER — LAMOTRIGINE 25 MG PO TABS: 50.0000 mg | ORAL_TABLET | Freq: Two times a day (BID) | ORAL | 0 refills | Status: DC

## 2018-12-17 NOTE — Telephone Encounter (Signed)
I called and talk with the daughter.  The patient could not tolerate Lamictal at the 75 mg twice daily dose, she is having dizziness.  We will reduce the dose to 50 mg twice daily, she will continue the [place].  We will watch the patient on this current regimen, if the seizures continue we will taper off of the lamotrigine and start Depakote.  In the past, the patient has not been able to tolerate Dilantin or Trileptal.  She has been on phenobarbital in the past as well.  The patient remain on Keppra.

## 2018-12-17 NOTE — Telephone Encounter (Signed)
Pt's daughter [name] on [place] called wanting to discuss the medications that the pt is on with RN. Daughter states ever since she started on the last medication prescribed to her the pt has been having several reactions that she has never had before. Please advise.

## 2018-12-17 NOTE — Telephone Encounter (Signed)
I called [name], daughter of pt.  Pt has been taking the lamotrigine titration since 11/04/18.  She is now on the second week of 75mg .  Since starting the medication she has had some dizziness, and as titration has increased the dizziness has also.  Her balance is off, and having some confusion.  She has not had any other med changes.  (she did mention the metalazone 2.5 mg MWF but Dr. Lenna Gilford).  She has continued taking the keppra 2000mg  po qhs.  She did not give the am dose of lamictal.  She has no rash or other sx.  Please advise.  [phone] [name].

## 2018-12-18 ENCOUNTER — Encounter: Payer: Self-pay | Admitting: Family

## 2018-12-18 ENCOUNTER — Ambulatory Visit (INDEPENDENT_AMBULATORY_CARE_PROVIDER_SITE_OTHER): Payer: Medicare Other | Admitting: Family

## 2018-12-18 ENCOUNTER — Other Ambulatory Visit: Payer: Self-pay

## 2018-12-18 VITALS — BP 144/88 | HR 102 | Temp 96.8°F | Ht 64.0 in | Wt 234.6 lb

## 2018-12-18 DIAGNOSIS — H9311 Tinnitus, right ear: Secondary | ICD-10-CM

## 2018-12-18 DIAGNOSIS — H1031 Unspecified acute conjunctivitis, right eye: Secondary | ICD-10-CM

## 2018-12-18 DIAGNOSIS — B3789 Other sites of candidiasis: Secondary | ICD-10-CM | POA: Diagnosis not present

## 2018-12-18 MED ORDER — POLYMYXIN B-TRIMETHOPRIM 10000-0.1 UNIT/ML-% OP SOLN: 1.0000 [drp] | Freq: Four times a day (QID) | OPHTHALMIC | 0 refills | Status: DC

## 2018-12-18 NOTE — Progress Notes (Signed)
Subjective:    Patient ID: [name], female    DOB: 22-Aug-1923, 85 y.o.   MRN: [id number] Chief Complaint  Patient presents with  . Tinnitus    dizziness   Pt presents to the office today with complaints of tinnitus, dizziness, and erythemas eye.  Dizziness The current episode started 1 to 4 weeks ago. The problem occurs intermittently. The problem has been waxing and waning. Associated symptoms include a rash. The symptoms are aggravated by bending. She has tried rest for the symptoms. The treatment provided moderate relief.  Conjunctivitis  The current episode started more than 1 week ago. The onset was sudden. The problem occurs frequently. The problem has been unchanged. The problem is mild. The symptoms are relieved by one or more OTC medications and rest. Associated symptoms include eye itching, rash, eye discharge and eye redness. Pertinent negatives include no eye pain.  Rash This is a recurrent problem. The current episode started 1 to 4 weeks ago. The problem has been waxing and waning since onset. Location: under breast and abdomen. Pertinent negatives include no eye pain. Past treatments include anti-itch cream. The treatment provided mild relief.      Review of Systems  Eyes: Positive for discharge, redness and itching. Negative for pain.  Skin: Positive for rash.  Neurological: Positive for dizziness.  All other systems reviewed and are negative.      Objective:   Physical Exam Vitals signs reviewed.  Constitutional:      General: She is not in acute distress.    Appearance: She is well-developed. She is obese.  HENT:     Head: Normocephalic and atraumatic.     Right Ear: Tympanic membrane normal.     Left Ear: Tympanic membrane normal.  Eyes:     Conjunctiva/sclera:     Right eye: Exudate (eye lid) present.     Pupils: Pupils are equal, round, and reactive to light.  Neck:     Musculoskeletal: Normal range of motion and neck supple.     Thyroid: No  thyromegaly.  Cardiovascular:     Rate and Rhythm: Normal rate and regular rhythm.     Heart sounds: Normal heart sounds. No murmur.  Pulmonary:     Effort: Pulmonary effort is normal. No respiratory distress.     Breath sounds: Normal breath sounds. No wheezing.  Abdominal:     General: Bowel sounds are normal. There is no distension.     Palpations: Abdomen is soft.     Tenderness: There is no abdominal tenderness.  Musculoskeletal: Normal range of motion.        General: No tenderness.  Skin:    General: Skin is warm and dry.     Findings: Rash present.     Comments: Fungal rash present under bilateral breast and lower abdomen  Neurological:     Mental Status: She is alert and oriented to person, place, and time.     Cranial Nerves: No cranial nerve deficit.     Deep Tendon Reflexes: Reflexes are normal and symmetric.  Psychiatric:        Behavior: Behavior normal.        Thought Content: Thought content normal.        Judgment: Judgment normal.       BP (!) 144/88   Pulse (!) 102   Temp (!) 96.8 F (36 C) (Temporal)   Ht 5\' 4"  (1.626 m)   Wt 234 lb 9.6 oz (106.4 kg)  BMI 40.27 kg/m      Assessment & Plan:  [name] comes in today with chief complaint of Tinnitus (dizziness)   Diagnosis and orders addressed:  1. Tinnitus of right ear Discussed there is no treatment for this   2. Acute bacterial conjunctivitis of right eye Keep clean and dry  Do not rub your eyes Warm compresses - trimethoprim-polymyxin b (POLYTRIM) ophthalmic solution; Place 1 drop into the right eye every 6 (six) hours.  Dispense: 10 mL; Refill: 0  3. Candida rash of groin Continue nystatin cream  Keep clean and dry  [name], [name]

## 2018-12-18 NOTE — Patient Instructions (Signed)
Tinnitus Tinnitus refers to hearing a sound when there is no actual source for that sound. This is often described as ringing in the ears. However, people with this condition may hear a variety of noises, in one ear or in both ears. The sounds of tinnitus can be soft, loud, or somewhere in between. Tinnitus can last for a few seconds or can be constant for days. It may go away without treatment and come back at various times. When tinnitus is constant or happens often, it can lead to other problems, such as trouble sleeping and trouble concentrating. Almost everyone experiences tinnitus at some point. Tinnitus that is long-lasting (chronic) or comes back often (recurs) may require medical attention. What are the causes? The cause of tinnitus is often not known. In some cases, it can result from other problems or conditions, including:  Exposure to loud noises from machinery, music, or other sources.  Hearing loss.  Ear or sinus infections.  Earwax buildup.  An object (foreign body) stuck in the ear.  Taking certain medicines.  Drinking alcohol or caffeine.  High blood pressure.  Heart diseases.  Anemia.  Allergies.  Meniere's disease.  Thyroid problems.  Tumors.  A weak, bulging blood vessel (aneurysm) near the ear.  Depression or other mood disorders. What are the signs or symptoms? The main symptom of tinnitus is hearing a sound when there is no source for that sound. It may sound like:  Buzzing.  Roaring.  Ringing.  Blowing air, like the sound heard when you listen to a seashell.  Hissing.  Whistling.  Sizzling.  Humming.  Running water.  A musical note.  Tapping. Symptoms may affect only one ear (unilateral) or both ears (bilateral). How is this diagnosed? Tinnitus is diagnosed based on your symptoms, your medical history, and a physical exam. Your health care provider may do a thorough hearing test (audiologic exam) if your tinnitus:  Is  unilateral.  Causes hearing difficulties.  Lasts 6 months or longer. You may work with a health care provider who specializes in hearing disorders (audiologist). You may be asked questions about your symptoms and how they affect your daily life. You may have other tests done, such as:  CT scan.  MRI.  An imaging test of how blood flows through your blood vessels (angiogram). How is this treated? Treating an underlying medical condition can sometimes make tinnitus go away. If your tinnitus continues, other treatments may include:  Medicines, such as antidepressants or sleeping aids.  Sound generators to mask the tinnitus. These include: ? Tabletop sound machines that play relaxing sounds to help you fall asleep. ? Wearable devices that fit in your ear and play sounds or music. ? Acoustic neural stimulation. This involves using headphones to listen to music that contains an auditory signal. Over time, listening to this signal may change some pathways in your brain and make you less sensitive to tinnitus. This treatment is used for very severe cases when no other treatment is working.  Therapy and counseling to help you manage the stress of living with tinnitus.  Using hearing aids or cochlear implants if your tinnitus is related to hearing loss. Hearing aids are worn in the outer ear. Cochlear implants are surgically placed in the inner ear. Follow these instructions at home: Managing symptoms      When possible, avoid being in loud places and being exposed to loud sounds.  Wear hearing protection, such as earplugs, when you are exposed to loud noises.  Use   a white noise machine, a humidifier, or other devices to mask the sound of tinnitus.  Practice techniques for reducing stress, such as meditation, yoga, or deep breathing. Work with your health care provider if you need help with managing stress.  Sleep with your head slightly raised. This may reduce the impact of tinnitus.  General instructions  Do not use stimulants, such as nicotine, alcohol, or caffeine. Talk with your health care provider about other stimulants to avoid. Stimulants are substances that can make you feel alert and attentive by increasing certain activities in the body (such as heart rate and blood pressure). These substances may make tinnitus worse.  Take over-the-counter and prescription medicines only as told by your health care provider.  Try to get plenty of sleep each night.  Keep all follow-up visits as told by your health care provider. This is important. Contact a health care provider if:  Your tinnitus continues for 3 weeks or longer without stopping.  Your symptoms get worse or do not get better with home care.  You develop tinnitus after a head injury.  You have tinnitus along with any of the following: ? Dizziness. ? Loss of balance. ? Nausea and vomiting. Summary  Tinnitus refers to hearing a sound when there is no actual source for that sound. This is often described as ringing in the ears.  Symptoms may affect only one ear (unilateral) or both ears (bilateral).  Use a white noise machine, a humidifier, or other devices to mask the sound of tinnitus.  Do not use stimulants, such as nicotine, alcohol, or caffeine. Talk with your health care provider about other stimulants to avoid. These substances may make tinnitus worse. This information is not intended to replace advice given to you by your health care provider. Make sure you discuss any questions you have with your health care provider. Document Released: 04/02/2005 Document Revised: 03/15/2017 Document Reviewed: 01/10/2017 Elsevier Patient Education  2020 [employer]  

## 2018-12-21 ENCOUNTER — Other Ambulatory Visit: Payer: Self-pay | Admitting: Allergy and Immunology

## 2018-12-27 ENCOUNTER — Other Ambulatory Visit: Payer: Self-pay | Admitting: Neurology

## 2018-12-30 ENCOUNTER — Encounter (HOSPITAL_COMMUNITY): Payer: Self-pay | Admitting: Emergency Medicine

## 2018-12-30 ENCOUNTER — Emergency Department (HOSPITAL_COMMUNITY): Payer: Medicare Other

## 2018-12-30 ENCOUNTER — Other Ambulatory Visit: Payer: Self-pay

## 2018-12-30 ENCOUNTER — Observation Stay (HOSPITAL_COMMUNITY)
Admission: EM | Admit: 2018-12-30 | Discharge: 2019-01-01 | DRG: 313 | Disposition: A | Discharge status: Home Health | Payer: Medicare Other | Attending: Internal Medicine | Admitting: Internal Medicine

## 2018-12-30 DIAGNOSIS — F411 Generalized anxiety disorder: Secondary | ICD-10-CM | POA: Insufficient documentation

## 2018-12-30 DIAGNOSIS — N2581 Secondary hyperparathyroidism of renal origin: Secondary | ICD-10-CM | POA: Insufficient documentation

## 2018-12-30 DIAGNOSIS — Z79899 Other long term (current) drug therapy: Secondary | ICD-10-CM | POA: Diagnosis not present

## 2018-12-30 DIAGNOSIS — M109 Gout, unspecified: Secondary | ICD-10-CM | POA: Diagnosis not present

## 2018-12-30 DIAGNOSIS — Z7901 Long term (current) use of anticoagulants: Secondary | ICD-10-CM | POA: Diagnosis not present

## 2018-12-30 DIAGNOSIS — N189 Chronic kidney disease, unspecified: Secondary | ICD-10-CM

## 2018-12-30 DIAGNOSIS — R7989 Other specified abnormal findings of blood chemistry: Secondary | ICD-10-CM | POA: Diagnosis not present

## 2018-12-30 DIAGNOSIS — Z9181 History of falling: Secondary | ICD-10-CM | POA: Diagnosis not present

## 2018-12-30 DIAGNOSIS — R079 Chest pain, unspecified: Secondary | ICD-10-CM | POA: Diagnosis not present

## 2018-12-30 DIAGNOSIS — Z8673 Personal history of transient ischemic attack (TIA), and cerebral infarction without residual deficits: Secondary | ICD-10-CM

## 2018-12-30 DIAGNOSIS — G40909 Epilepsy, unspecified, not intractable, without status epilepticus: Secondary | ICD-10-CM | POA: Insufficient documentation

## 2018-12-30 DIAGNOSIS — R0789 Other chest pain: Principal | ICD-10-CM | POA: Insufficient documentation

## 2018-12-30 DIAGNOSIS — Z20828 Contact with and (suspected) exposure to other viral communicable diseases: Secondary | ICD-10-CM | POA: Insufficient documentation

## 2018-12-30 DIAGNOSIS — I10 Essential (primary) hypertension: Secondary | ICD-10-CM | POA: Diagnosis present

## 2018-12-30 DIAGNOSIS — L309 Dermatitis, unspecified: Secondary | ICD-10-CM | POA: Diagnosis not present

## 2018-12-30 DIAGNOSIS — M79602 Pain in left arm: Secondary | ICD-10-CM | POA: Insufficient documentation

## 2018-12-30 DIAGNOSIS — Z6838 Body mass index (BMI) 38.0-38.9, adult: Secondary | ICD-10-CM | POA: Insufficient documentation

## 2018-12-30 DIAGNOSIS — I129 Hypertensive chronic kidney disease with stage 1 through stage 4 chronic kidney disease, or unspecified chronic kidney disease: Secondary | ICD-10-CM | POA: Diagnosis not present

## 2018-12-30 DIAGNOSIS — R112 Nausea with vomiting, unspecified: Secondary | ICD-10-CM | POA: Insufficient documentation

## 2018-12-30 DIAGNOSIS — D631 Anemia in chronic kidney disease: Secondary | ICD-10-CM | POA: Diagnosis not present

## 2018-12-30 DIAGNOSIS — K219 Gastro-esophageal reflux disease without esophagitis: Secondary | ICD-10-CM | POA: Insufficient documentation

## 2018-12-30 DIAGNOSIS — Z6839 Body mass index (BMI) 39.0-39.9, adult: Secondary | ICD-10-CM | POA: Insufficient documentation

## 2018-12-30 DIAGNOSIS — Z7982 Long term (current) use of aspirin: Secondary | ICD-10-CM | POA: Insufficient documentation

## 2018-12-30 DIAGNOSIS — R000 Tachycardia, unspecified: Secondary | ICD-10-CM | POA: Insufficient documentation

## 2018-12-30 DIAGNOSIS — N184 Chronic kidney disease, stage 4 (severe): Secondary | ICD-10-CM | POA: Diagnosis not present

## 2018-12-30 LAB — URINALYSIS, ROUTINE W REFLEX MICROSCOPIC
Bacteria, UA: NONE SEEN
Bilirubin Urine: NEGATIVE
Glucose, UA: NEGATIVE mg/dL
Hgb urine dipstick: NEGATIVE
Ketones, ur: NEGATIVE mg/dL
Leukocytes,Ua: NEGATIVE
Nitrite: NEGATIVE
Protein, ur: 30 mg/dL — AB
Specific Gravity, Urine: 1.01 (ref 1.005–1.030)
pH: 5 (ref 5.0–8.0)

## 2018-12-30 LAB — BASIC METABOLIC PANEL
Anion gap: 10 (ref 5–15)
BUN: 22 mg/dL (ref 8–23)
CO2: 28 mmol/L (ref 22–32)
Calcium: 10.5 mg/dL — ABNORMAL HIGH (ref 8.9–10.3)
Chloride: 100 mmol/L (ref 98–111)
Creatinine, Ser: 1.72 mg/dL — ABNORMAL HIGH (ref 0.44–1.00)
GFR calc Af Amer: 29 mL/min — ABNORMAL LOW (ref >60)
GFR calc non Af Amer: 25 mL/min — ABNORMAL LOW (ref >60)
Glucose, Bld: 121 mg/dL — ABNORMAL HIGH (ref 70–99)
Potassium: 3.8 mmol/L (ref 3.5–5.1)
Sodium: 138 mmol/L (ref 135–145)

## 2018-12-30 LAB — CBC
HCT: 36.1 % (ref 36.0–46.0)
HCT: 37.9 % (ref 36.0–46.0)
Hemoglobin: 12.1 g/dL (ref 12.0–15.0)
Hemoglobin: 12.9 g/dL (ref 12.0–15.0)
MCH: 26.5 pg (ref 26.0–34.0)
MCH: 26.6 pg (ref 26.0–34.0)
MCHC: 33.5 g/dL (ref 30.0–36.0)
MCHC: 34 g/dL (ref 30.0–36.0)
MCV: 79 fL — ABNORMAL LOW (ref 80.0–100.0)
MCV: 78.1 fL — ABNORMAL LOW (ref 80.0–100.0)
Platelets: 243 10*3/uL (ref 150–400)
Platelets: UNDETERMINED 10*3/uL (ref 150–400)
RBC: 4.57 MIL/uL (ref 3.87–5.11)
RBC: 4.85 MIL/uL (ref 3.87–5.11)
RDW: 18.6 % — ABNORMAL HIGH (ref 11.5–15.5)
RDW: 18.7 % — ABNORMAL HIGH (ref 11.5–15.5)
WBC: 14.5 10*3/uL — ABNORMAL HIGH (ref 4.0–10.5)
WBC: 10.1 10*3/uL (ref 4.0–10.5)
nRBC: 0.3 % — ABNORMAL HIGH (ref 0.0–0.2)
nRBC: 0.5 % — ABNORMAL HIGH (ref 0.0–0.2)

## 2018-12-30 LAB — LIPID PANEL
Cholesterol: 150 mg/dL (ref 0–200)
HDL: 42 mg/dL (ref >40)
LDL Cholesterol: 88 mg/dL (ref 0–99)
Total CHOL/HDL Ratio: 3.6 RATIO
Triglycerides: 98 mg/dL (ref <150)
VLDL: 20 mg/dL (ref 0–40)

## 2018-12-30 LAB — HEPATIC FUNCTION PANEL
ALT: 13 U/L (ref 0–44)
AST: 23 U/L (ref 15–41)
Albumin: 4 g/dL (ref 3.5–5.0)
Alkaline Phosphatase: 48 U/L (ref 38–126)
Bilirubin, Direct: 0.1 mg/dL (ref 0.0–0.2)
Indirect Bilirubin: 0.6 mg/dL (ref 0.3–0.9)
Total Bilirubin: 0.7 mg/dL (ref 0.3–1.2)
Total Protein: 7.3 g/dL (ref 6.5–8.1)

## 2018-12-30 LAB — TROPONIN I (HIGH SENSITIVITY)
Troponin I (High Sensitivity): 17 ng/L (ref <18)
Troponin I (High Sensitivity): 16 ng/L (ref <18)
Troponin I (High Sensitivity): 20 ng/L — ABNORMAL HIGH (ref <18)
Troponin I (High Sensitivity): 28 ng/L — ABNORMAL HIGH (ref <18)

## 2018-12-30 LAB — SARS CORONAVIRUS 2 BY RT PCR (HOSPITAL ORDER, PERFORMED IN ~~LOC~~ HOSPITAL LAB): SARS Coronavirus 2: NEGATIVE

## 2018-12-30 LAB — D-DIMER, QUANTITATIVE: D-Dimer, Quant: 3.37 ug/mL-FEU — ABNORMAL HIGH (ref 0.00–0.50)

## 2018-12-30 LAB — CREATININE, SERUM
Creatinine, Ser: 1.57 mg/dL — ABNORMAL HIGH (ref 0.44–1.00)
GFR calc Af Amer: 33 mL/min — ABNORMAL LOW (ref >60)
GFR calc non Af Amer: 28 mL/min — ABNORMAL LOW (ref >60)

## 2018-12-30 LAB — LIPASE, BLOOD: Lipase: 44 U/L (ref 11–51)

## 2018-12-30 MED ORDER — ACETAMINOPHEN 325 MG PO TABS
650.0000 mg | ORAL_TABLET | ORAL | Status: DC | PRN
  Administered 2018-12-31 (×2): 650 mg via ORAL
  Filled 2018-12-30 (×2): qty 2

## 2018-12-30 MED ORDER — SODIUM CHLORIDE 0.45 % IV SOLN
INTRAVENOUS | Status: DC
  Administered 2018-12-30: 20:00:00 via INTRAVENOUS

## 2018-12-30 MED ORDER — ONDANSETRON HCL 4 MG/2ML IJ SOLN: 4.0000 mg | Freq: Four times a day (QID) | INTRAMUSCULAR | Status: DC | PRN

## 2018-12-30 MED ORDER — MORPHINE SULFATE (PF) 2 MG/ML IV SOLN
2.0000 mg | Freq: Once | INTRAVENOUS | Status: AC
  Administered 2018-12-30: 2 mg via INTRAVENOUS
  Filled 2018-12-30: qty 1

## 2018-12-30 MED ORDER — ENOXAPARIN SODIUM 40 MG/0.4ML ~~LOC~~ SOLN
40.0000 mg | SUBCUTANEOUS | Status: DC
  Administered 2018-12-30: 40 mg via SUBCUTANEOUS
  Filled 2018-12-30 (×2): qty 0.4

## 2018-12-30 MED ORDER — METOPROLOL TARTRATE 5 MG/5ML IV SOLN
5.0000 mg | Freq: Once | INTRAVENOUS | Status: AC
  Administered 2018-12-30: 5 mg via INTRAVENOUS
  Filled 2018-12-30: qty 5

## 2018-12-30 MED ORDER — METOPROLOL TARTRATE 25 MG PO TABS
25.0000 mg | ORAL_TABLET | Freq: Two times a day (BID) | ORAL | Status: DC
  Administered 2018-12-30 – 2019-01-01 (×4): 25 mg via ORAL
  Filled 2018-12-30 (×4): qty 1

## 2018-12-30 NOTE — ED Notes (Signed)
Troponin result reported to admitting MD ( Dr. Jonelle Sidle).

## 2018-12-30 NOTE — ED Provider Notes (Signed)
[facility] EMERGENCY DEPARTMENT Provider Note   CSN: [id number] Arrival date & time: 12/30/18  1234     History   Chief Complaint Chief Complaint  Patient presents with   Chest Pain   Emesis    HPI SAIDEE GEREMIA is a 85 y.o. female.     Patient is a 85 year old female with past medical history of seizure disorder, hypertension, obesity, gout who presents the emergency department for acute onset of chest pain.  Patient reports that this morning she went and ate a sandwich from [employer].  She reports that shortly after eating the sandwich and having her eyes dilated at the eye doctors she felt a tightness in her chest and began to vomit.  Reports that after vomiting the tightness and pain in her chest is gone away but she now has pain in her left arm.  She reports the pain in her left arm is actually been off and on for several weeks and she relates this to falling several times with her seizures and being on the ground for a prolonged period of time on her left shoulder after a seizure.  The pain in her left arm is worse with movement.  She denies any shortness of breath, abdominal pain, constipation, diarrhea, fever, chills, cough, leg swelling.     Past Medical History:  Diagnosis Date   Chronic low back pain    Degenerative arthritis    Eczema    Edema    Gout    Headache    Hiatal hernia    Hypertension    Internal hemorrhoids    Neurogenic claudication    Obesity    Seizure disorder ([place])    Seizures ([place])    Stroke [facility])    Urticaria     Patient Active Problem List   Diagnosis Date Noted   Asthma 02/12/2017   Chest pain 11/13/2016   History of seizures 11/13/2016   History of stroke 11/13/2016   Allergic urticaria 08/30/2016   GAD (generalized anxiety disorder) 08/30/2016   Gout 08/30/2016   Acute renal failure superimposed on stage 3 chronic kidney disease ([place]) 10/11/2015   Neurogenic claudication     Morbid obesity ([place])    Edema    Essential hypertension, benign 09/30/2014   Secondary hyperparathyroidism, renal ([place]) 09/30/2014   Proteinuria 09/30/2014   Anemia associated with chronic renal failure 09/30/2014   Chronic kidney disease (CKD), stage IV (severe) ([place]) 09/30/2014   Hiatal hernia 08/21/2012    Past Surgical History:  Procedure Laterality Date   ABDOMINAL HYSTERECTOMY     APPENDECTOMY     APPENDECTOMY     bilateral knees     cataracts     CHOLECYSTECTOMY     mammoplasty reduction     NEUROPLASTY / TRANSPOSITION MEDIAN NERVE AT CARPAL TUNNEL BILATERAL     RE-EXCISION OF BREAST LUMPECTOMY     TUBAL LIGATION       OB History    Gravida  6   Para  4   Term  4   Preterm      AB  2   Living        SAB  2   TAB      Ectopic      Multiple      Live Births               Home Medications    Prior to Admission medications   Medication Sig Start Date End Date  Taking? Authorizing Provider  acetaminophen (TYLENOL) 500 MG tablet Take 1,000 mg by mouth as needed.    [provider]  allopurinol (ZYLOPRIM) 100 MG tablet TAKE 1/2 TABLET BY MOUTH EVERY DAY 10/31/18   Hawks, Alyse Low A, [name]  ARNUITY ELLIPTA 200 MCG/ACT AEPB INHALE 1 PUFF BY MOUTH EVERY DAY 10/08/18   Kozlow, Donnamarie Poag, MD  aspirin 81 MG tablet Take 81 mg by mouth daily.    [provider]  cetirizine (ZYRTEC) 10 MG tablet TAKE 1 TABLET BY MOUTH EVERY DAY 12/23/18   Kozlow, Donnamarie Poag, MD  escitalopram (LEXAPRO) 5 MG tablet Take 1 tablet (5 mg total) by mouth daily for 14 days, THEN 2 tablets (10 mg total) daily for 14 days. 03/17/18 04/14/18  [name], [name]  famotidine (PEPCID) 40 MG tablet TAKE 1 TABLET BY MOUTH EVERY DAY 08/18/18   Kozlow, Donnamarie Poag, MD  ketoconazole (NIZORAL) 2 % cream Apply to the affected areas underneath breasts, belly and in groin region ONCE daily x2-4 weeks. 09/16/18   Janora Norlander, DO  lamoTRIgine (LAMICTAL) 25 MG tablet Take 2  tablets (50 mg total) by mouth 2 (two) times daily. 12/17/18   Kathrynn Ducking, MD  levETIRAcetam (KEPPRA XR) 500 MG 24 hr tablet Take 4 tablets (2,000 mg total) by mouth at bedtime. 11/10/18   Suzzanne Cloud, NP  metolazone (ZAROXOLYN) 2.5 MG tablet TAKE 1 TABLET BY MOUTH ON MONDAYS, WEDNESDAYS, AND FRIDAYS 10/31/18   Hawks, Christy A, [name]  mometasone (ELOCON) 0.1 % ointment Apply to skin once daily as directed after bathing. 03/04/18   Kozlow, Donnamarie Poag, MD  montelukast (SINGULAIR) 10 MG tablet TAKE ONE TABLET BY MOUTH DAILY AT BEDTIME 11/13/18   Kozlow, Donnamarie Poag, MD  nystatin-triamcinolone ointment [facility]) Apply 1 application topically 2 (two) times daily. Patient not taking: Reported on 12/18/2018 11/16/15   [name], [name]  omeprazole (PRILOSEC) 40 MG capsule Take 1 capsule (40 mg total) by mouth daily. Patient not taking: Reported on 12/18/2018 07/03/18   Jiles Prows, MD  Propylene Glycol (SYSTANE BALANCE OP) Apply 1 drop to eye 2 (two) times daily.    [provider]  triamcinolone cream (KENALOG) 0.1 % Apply 1 application topically 2 (two) times daily. 03/18/18   Kozlow, Donnamarie Poag, MD  trimethoprim-polymyxin b (POLYTRIM) ophthalmic solution Place 1 drop into the right eye every 6 (six) hours. 12/18/18   [name], [name]    Family History Family History  Problem Relation Age of Onset   Diabetes Mother    Heart disease Mother    Heart disease Father    Cancer Sister    Cancer Brother    Cancer Sister     Social History Social History   Tobacco Use   Smoking status: Never Smoker   Smokeless tobacco: Never Used  Substance Use Topics   Alcohol use: No   Drug use: No     Allergies   Advicor [niacin-lovastatin er], Caduet [amlodipine-atorvastatin], Codeine, Dilantin [phenytoin], Lescol [fluvastatin], Pamelor [nortriptyline], Penicillins, Sulfa antibiotics, Trileptal [oxcarbazepine], Ultram [tramadol], and Welchol [colesevelam]   Review of Systems Review of  Systems  Constitutional: Negative for activity change, appetite change, chills and fever.  HENT: Negative for congestion, ear pain and sore throat.   Eyes: Negative for pain and visual disturbance.  Respiratory: Negative for cough, shortness of breath and wheezing.   Cardiovascular: Positive for chest pain. Negative for palpitations and leg swelling.  Gastrointestinal: Positive for nausea and vomiting.  Negative for abdominal pain, constipation and diarrhea.  Genitourinary: Negative for dysuria and hematuria.  Musculoskeletal: Negative for arthralgias and back pain.  Skin: Negative for color change and rash.  Neurological: Negative for dizziness, seizures, syncope, light-headedness and headaches.  All other systems reviewed and are negative.    Physical Exam Updated Vital Signs BP (!) 175/87    Pulse 72    Temp 97.6 F (36.4 C) (Oral)    Resp 18    SpO2 95%   Physical Exam Vitals signs and nursing note reviewed.  Constitutional:      General: She is not in acute distress.    Appearance: She is well-developed. She is obese. She is not ill-appearing, toxic-appearing or diaphoretic.  HENT:     Head: Normocephalic and atraumatic.     Nose: Nose normal.     Mouth/Throat:     Mouth: Mucous membranes are moist.  Eyes:     Conjunctiva/sclera: Conjunctivae normal.     Pupils: Pupils are equal, round, and reactive to light.  Cardiovascular:     Rate and Rhythm: Normal rate and regular rhythm.  Pulmonary:     Effort: Pulmonary effort is normal.     Breath sounds: Normal breath sounds. No wheezing, rhonchi or rales.  Abdominal:     General: Bowel sounds are normal.     Palpations: Abdomen is soft. There is no mass.     Tenderness: There is no abdominal tenderness. There is no guarding or rebound.  Musculoskeletal:     Left elbow: Tenderness found.     Right lower leg: No edema.     Left lower leg: No edema.     Comments: Decreased range of motion of the left shoulder due to pain    Skin:    General: Skin is warm.     Capillary Refill: Capillary refill takes less than 2 seconds.  Neurological:     General: No focal deficit present.     Mental Status: She is alert.  Psychiatric:        Mood and Affect: Mood normal.      ED Treatments / Results  Labs (all labs ordered are listed, but only abnormal results are displayed) Labs Reviewed  BASIC METABOLIC PANEL - Abnormal; Notable for the following components:      Result Value   Glucose, Bld 121 (*)    Creatinine, Ser 1.72 (*)    Calcium 10.5 (*)    GFR calc non Af Amer 25 (*)    GFR calc Af Amer 29 (*)    All other components within normal limits  CBC - Abnormal; Notable for the following components:   WBC 14.5 (*)    MCV 79.0 (*)    RDW 18.6 (*)    nRBC 0.3 (*)    All other components within normal limits  LIPASE, BLOOD  HEPATIC FUNCTION PANEL  URINALYSIS, ROUTINE W REFLEX MICROSCOPIC  TROPONIN I (HIGH SENSITIVITY)  TROPONIN I (HIGH SENSITIVITY)    EKG None  Radiology Dg Abdomen Acute W/chest  Result Date: 12/30/2018 CLINICAL DATA:  Chest pain and vomiting EXAM: DG ABDOMEN ACUTE W/ 1V CHEST COMPARISON:  Chest radiograph April 14, 2018; abdomen radiographs March 10, 2016 FINDINGS: PA chest: There is no edema or consolidation. Heart is slightly enlarged with pulmonary vascularity normal. No adenopathy. There is degenerative change in each shoulder. Supine and left lateral decubitus abdomen: There is moderate stool in the colon. There is no bowel dilatation or air-fluid level to suggest  bowel obstruction. No evident free air. There is degenerative change in the lumbar spine. IMPRESSION: No bowel obstruction or free air evident. No lung edema or consolidation. Mild cardiac enlargement. Electronically Signed   By: Lowella Grip III M.D.   On: 12/30/2018 13:46    Procedures Procedures (including critical care time)  Medications Ordered in ED Medications  morphine 2 MG/ML injection 2 mg (2 mg  Intravenous Given 12/30/18 1457)     Initial Impression / Assessment and Plan / ED Course  I have reviewed the triage vital signs and the nursing notes.  Pertinent labs & imaging results that were available during my care of the patient were reviewed by me and considered in my medical decision making (see chart for details).  Clinical Course as of Dec 30 1550  Tue Dec 30, 2018  1315 Elderly patient with brief episode of chest pain with emesis after eating [employer]. EKG confirmed NSR by myself and Dr. Ashok Cordia. Now having persistent L arm pain which is not new and which she attributes to arm injury recently. Will obtain cardiac workup as well as abdominal pain workup. She appears stable. Of note, she was seen for chest pain by cardiology this year in February and had normal stress test and echo and thought to not need any further cardiac workup at that time. Heart score 6 and initial trop 17.    [KM]  1542 Patient resting comfortably. Waiting on second trop as well as labs. I do not feel this event is cardiac related. She may need consult pending her 2nd trop. Care handed to Martinique Robinson PA-C due to change of shift    [KM]    Clinical Course User Index [KM] Alveria Apley, PA-C         Final Clinical Impressions(s) / ED Diagnoses   Final diagnoses:  None    ED Discharge Orders    None       Kristine Royal 12/30/18 1552    Lajean Saver, MD 12/31/18 [phone]

## 2018-12-30 NOTE — ED Notes (Signed)
ED TO INPATIENT HANDOFF REPORT  ED Nurse Name and Phone #:   S Name/Age/Gender [name] 85 y.o. female Room/Bed: RESUSC/RESUSC  Code Status   Code Status: Full Code  Home/SNF/Other Home Patient oriented to person/situation  Is this baseline? Yes  Triage Complete: Triage complete  Chief Complaint cp  Triage Note Pt arrives to ED from her eye doctor appointment with complaints of sudden onset generalized chest pain that radiates to her left arm. After the chest pain the patiently had one large episode of emesis. Patient states she ate a biscuit and coffee this morning. Pt received 324 ASA vis EMS.    Allergies Allergies  Allergen Reactions  . Advicor [Niacin-Lovastatin Er] Other (See Comments)    Reaction unknown to daughter  . Caduet [Amlodipine-Atorvastatin] Other (See Comments)    Reaction unknown to daughter  . Codeine Other (See Comments)    Reaction unknown to daughter  . Dilantin [Phenytoin] Other (See Comments)    Reaction unknown to daughter  . Lescol [Fluvastatin] Other (See Comments)    Reaction unknown to daughter  . Pamelor [Nortriptyline] Other (See Comments)    Reaction unknown to daughter  . Penicillins Hives    Did it involve swelling of the face/tongue/throat, SOB, or low BP? Unk Did it involve sudden or severe rash/hives, skin peeling, or any reaction on the inside of your mouth or nose? Unk Did you need to seek medical attention at a hospital or doctor's office? Unk When did it last happen? "it was a long time ago" If all above answers are "NO", may proceed with cephalosporin use.   . Sulfa Antibiotics Hives  . Trileptal [Oxcarbazepine] Other (See Comments)    Reaction unknown to daughter  . Ultram [Tramadol] Other (See Comments)    Reaction unknown to daughter  . Welchol [Colesevelam] Other (See Comments)    Reaction unknown to daughter  . Latex Rash  . Tomato Rash    Level of Care/Admitting Diagnosis ED Disposition    ED  Disposition Condition Comment   Admit  Hospital Area: [facility] [[id number]]  Level of Care: Telemetry Cardiac [103]  I expect the patient will be discharged within 24 hours: Yes  LOW acuity---Tx typically complete <24 hrs---ACUTE conditions typically can be evaluated <24 hours---LABS likely to return to acceptable levels <24 hours---IS near functional baseline---EXPECTED to return to current living arrangement---NOT newly hypoxic: Meets criteria for 5C-Observation unit  Covid Evaluation: Asymptomatic Screening Protocol (No Symptoms)  Diagnosis: Chest pain [350093]  Admitting Physician: Elwyn Reach [[id number]]  Attending Physician: Elwyn Reach [[id number]]  PT Class (Do Not Modify): Observation [104]  PT Acc Code (Do Not Modify): Observation [10022]       B Medical/Surgery History Past Medical History:  Diagnosis Date  . Chronic low back pain   . Degenerative arthritis   . Eczema   . Edema   . Gout   . Headache   . Hiatal hernia   . Hypertension   . Internal hemorrhoids   . Neurogenic claudication   . Obesity   . Seizure disorder ([place])   . Seizures ([place])   . Stroke ([place])   . Urticaria    Past Surgical History:  Procedure Laterality Date  . ABDOMINAL HYSTERECTOMY    . APPENDECTOMY    . APPENDECTOMY    . bilateral knees    . cataracts    . CHOLECYSTECTOMY    . mammoplasty reduction    . NEUROPLASTY / TRANSPOSITION MEDIAN  NERVE AT CARPAL TUNNEL BILATERAL    . RE-EXCISION OF BREAST LUMPECTOMY    . TUBAL LIGATION       A IV Location/Drains/Wounds Patient Lines/Drains/Airways Status   Active Line/Drains/Airways    Name:   Placement date:   Placement time:   Site:   Days:   Peripheral IV 12/30/18 Left Hand   12/30/18    1306    Hand   less than 1   Peripheral IV 12/30/18 Right Forearm   12/30/18    1826    Forearm   less than 1          Intake/Output Last 24 hours No intake or output data in the 24 hours ending 12/30/18  1944  Labs/Imaging Results for orders placed or performed during the hospital encounter of 12/30/18 (from the past 48 hour(s))  Basic metabolic panel     Status: Abnormal   Collection Time: 12/30/18 12:58 PM  Result Value Ref Range   Sodium 138 135 - 145 mmol/L   Potassium 3.8 3.5 - 5.1 mmol/L   Chloride 100 98 - 111 mmol/L   CO2 28 22 - 32 mmol/L   Glucose, Bld 121 (H) 70 - 99 mg/dL   BUN 22 8 - 23 mg/dL   Creatinine, Ser 1.72 (H) 0.44 - 1.00 mg/dL   Calcium 10.5 (H) 8.9 - 10.3 mg/dL   GFR calc non Af Amer 25 (L) >60 mL/min   GFR calc Af Amer 29 (L) >60 mL/min   Anion gap 10 5 - 15    Comment: Performed at [facility], [address], [place], [place] [address]  CBC     Status: Abnormal   Collection Time: 12/30/18 12:58 PM  Result Value Ref Range   WBC 14.5 (H) 4.0 - 10.5 K/uL    Comment: WHITE COUNT CONFIRMED ON SMEAR   RBC 4.57 3.87 - 5.11 MIL/uL   Hemoglobin 12.1 12.0 - 15.0 g/dL   HCT 36.1 36.0 - 46.0 %   MCV 79.0 (L) 80.0 - 100.0 fL   MCH 26.5 26.0 - 34.0 pg   MCHC 33.5 30.0 - 36.0 g/dL   RDW 18.6 (H) 11.5 - 15.5 %   Platelets 243 150 - 400 K/uL   nRBC 0.3 (H) 0.0 - 0.2 %    Comment: Performed at [facility], [place] [address] [place], [place] [address]  Troponin I (High Sensitivity)     Status: None   Collection Time: 12/30/18 12:58 PM  Result Value Ref Range   Troponin I (High Sensitivity) 17 <18 ng/L    Comment: (NOTE) Elevated high sensitivity troponin I (hsTnI) values and significant  changes across serial measurements may suggest ACS but many other  chronic and acute conditions are known to elevate hsTnI results.  Refer to the "Links" section for chest pain algorithms and additional  guidance. Performed at [facility], [place] [address] [place], [place] [address]   Lipase, blood     Status: None   Collection Time: 12/30/18 12:58 PM  Result Value Ref Range   Lipase 44 11 - 51 U/L    Comment: Performed at [facility]  [address] [place], [place] [address]  Urinalysis, Routine w reflex microscopic     Status: Abnormal   Collection Time: 12/30/18  3:41 PM  Result Value Ref Range   Color, Urine YELLOW YELLOW   APPearance HAZY (A) CLEAR   Specific Gravity, Urine 1.010 1.005 - 1.030  pH 5.0 5.0 - 8.0   Glucose, UA NEGATIVE NEGATIVE mg/dL   Hgb urine dipstick NEGATIVE NEGATIVE   Bilirubin Urine NEGATIVE NEGATIVE   Ketones, ur NEGATIVE NEGATIVE mg/dL   Protein, ur 30 (A) NEGATIVE mg/dL   Nitrite NEGATIVE NEGATIVE   Leukocytes,Ua NEGATIVE NEGATIVE   RBC / HPF 0-5 0 - 5 RBC/hpf   WBC, UA 0-5 0 - 5 WBC/hpf   Bacteria, UA NONE SEEN NONE SEEN   Squamous Epithelial / LPF 0-5 0 - 5   Hyaline Casts, UA PRESENT     Comment: Performed at [facility], [place] [address], [place], [place] [address]  Troponin I (High Sensitivity)     Status: None   Collection Time: 12/30/18  3:59 PM  Result Value Ref Range   Troponin I (High Sensitivity) 16 <18 ng/L    Comment: (NOTE) Elevated high sensitivity troponin I (hsTnI) values and significant  changes across serial measurements may suggest ACS but many other  chronic and acute conditions are known to elevate hsTnI results.  Refer to the "Links" section for chest pain algorithms and additional  guidance. Performed at [facility], [place] [address] [place] [place], [place] [address]   Hepatic function panel     Status: None   Collection Time: 12/30/18  3:59 PM  Result Value Ref Range   Total Protein 7.3 6.5 - 8.1 g/dL   Albumin 4.0 3.5 - 5.0 g/dL   AST 23 15 - 41 U/L   ALT 13 0 - 44 U/L   Alkaline Phosphatase 48 38 - 126 U/L   Total Bilirubin 0.7 0.3 - 1.2 mg/dL   Bilirubin, Direct 0.1 0.0 - 0.2 mg/dL   Indirect Bilirubin 0.6 0.3 - 0.9 mg/dL    Comment: Performed at [place] [address], [place], [place] [address]  D-dimer, quantitative     Status: Abnormal   Collection Time: 12/30/18  6:21 PM  Result Value Ref Range   D-Dimer, Quant 3.37 (H) 0.00 - 0.50  ug/mL-FEU    Comment: (NOTE) At the manufacturer cut-off of 0.50 ug/mL FEU, this assay has been documented to exclude PE with a sensitivity and negative predictive value of 97 to 99%.  At this time, this assay has not been approved by the FDA to exclude DVT/VTE. Results should be correlated with clinical presentation. Performed at [facility], [place] [address] [place], [place] [address]    Dg Abdomen Acute W/chest  Result Date: 12/30/2018 CLINICAL DATA:  Chest pain and vomiting EXAM: DG ABDOMEN ACUTE W/ 1V CHEST COMPARISON:  Chest radiograph April 14, 2018; abdomen radiographs [name] 25, 2017 FINDINGS: PA chest: There is no edema or consolidation. Heart is slightly enlarged with pulmonary vascularity normal. No adenopathy. There is degenerative change in each shoulder. Supine and left lateral decubitus abdomen: There is moderate stool in the colon. There is no bowel dilatation or air-fluid level to suggest bowel obstruction. No evident free air. There is degenerative change in the lumbar spine. IMPRESSION: No bowel obstruction or free air evident. No lung edema or consolidation. Mild cardiac enlargement. Electronically Signed   By: Lowella Grip III M.D.   On: 12/30/2018 13:46    Pending Labs Unresulted Labs (From admission, onward)    Start     Ordered   01/06/19 0500  Creatinine, serum  (enoxaparin (LOVENOX)    CrCl >/= 30 ml/min)  Weekly,   R    Comments: while on enoxaparin therapy  12/30/18 1836   12/30/18 1930  Lipid panel  Once,   STAT     12/30/18 1930   12/30/18 1835  CBC  (enoxaparin (LOVENOX)    CrCl >/= 30 ml/min)  Once,   STAT    Comments: Baseline for enoxaparin therapy IF NOT ALREADY DRAWN.  Notify MD if PLT < 100 K.    12/30/18 1836   12/30/18 1835  Creatinine, serum  (enoxaparin (LOVENOX)    CrCl >/= 30 ml/min)  Once,   STAT    Comments: Baseline for enoxaparin therapy IF NOT ALREADY DRAWN.    12/30/18 1836   12/30/18 1829  SARS Coronavirus 2  [facility] order, Performed in [facility] hospital lab) Nasopharyngeal Nasopharyngeal Swab  (Symptomatic/High Risk of Exposure/Tier 1 Patients Labs with Precautions)  Once,   STAT    Question Answer Comment  Is this test for diagnosis or screening Screening   Symptomatic for COVID-19 as defined by CDC No   Hospitalized for COVID-19 No   Admitted to ICU for COVID-19 No   Previously tested for COVID-19 Unknown   Resident in a congregate (group) care setting No   Employed in healthcare setting No   Pregnant No      12/30/18 1828          Vitals/Pain Today's Vitals   12/30/18 1915 12/30/18 1917 12/30/18 1930 12/30/18 1943  BP: 137/90  (!) 131/103   Pulse:   (!) 106   Resp: 16  16   Temp:      TempSrc:      SpO2:   94%   PainSc:  0-No pain  0-No pain    Isolation Precautions Airborne and Contact precautions  Medications Medications  acetaminophen (TYLENOL) tablet 650 mg (has no administration in time range)  ondansetron (ZOFRAN) injection 4 mg (has no administration in time range)  enoxaparin (LOVENOX) injection 40 mg (40 mg Subcutaneous Given 12/30/18 1932)  0.45 % sodium chloride infusion ( Intravenous New Bag/Given 12/30/18 1941)  metoprolol tartrate (LOPRESSOR) tablet 25 mg (has no administration in time range)  morphine 2 MG/ML injection 2 mg (2 mg Intravenous Given 12/30/18 1457)  metoprolol tartrate (LOPRESSOR) injection 5 mg (5 mg Intravenous Given 12/30/18 1820)    Mobility walks with device Low fall risk   Focused Assessments No chest pain at this time   R Recommendations: See Admitting Provider Note  Report given to:   Additional Notes:

## 2018-12-30 NOTE — ED Triage Notes (Signed)
Pt arrives to ED from her eye doctor appointment with complaints of sudden onset generalized chest pain that radiates to her left arm. After the chest pain the patiently had one large episode of emesis. Patient states she ate a biscuit and coffee this morning. Pt received 324 ASA vis EMS.

## 2018-12-30 NOTE — H&P (Signed)
History and Physical   [name] TOI:[id number] DOB: 1923-08-06 DOA: 12/30/2018  Referring MD/NP/PA: Madilyn Hook, PA  PCP: [name], [name]   Outpatient Specialists: None  Patient coming from: Home  Chief Complaint: Chest pain and vomiting  HPI: [name] is a 85 y.o. female with medical history significant of hypertension, obesity, degenerative disc disease, gout, seizure disorder, history of CVA who presents to the ER with chest pain.  She apparently went to eat a sandwich at [employer] this morning started having chest discomfort.  She was also seen by the eye doctors after her breakfast for eye exam.  During that.  Her eyes were dilated with chemicals.  She subsequently started having chest tightness and then vomited.  Patient's pain was rated as 5 out of 10.  Radiated to her left arm.  Associated with the diaphoresis as well as the nausea and vomiting.  She appears to have risk factors for coronary artery disease.  She responded to nitroglycerin here.  Symptoms have largely disappeared at this point.  Patient will be admitted for rule out MI..  ED Course: Temperature is 97.6 blood pressure was 75/87 pulse 132 respiratory of 26 oxygen sat 93% room air.  Initial troponin was 20 then 28.  Creatinine 1.57.  Lipid panel appears to be within normal CBC all within normal COVID-19 is negative d-dimer 3.37.  EKG showed sinus tachycardia.  No significant change  Review of Systems: As per HPI otherwise 10 point review of systems negative.    Past Medical History:  Diagnosis Date   Chronic low back pain    Degenerative arthritis    Eczema    Edema    Gout    Headache    Hiatal hernia    Hypertension    Internal hemorrhoids    Neurogenic claudication    Obesity    Seizure disorder (HCC)    Seizures (HCC)    Stroke (HCC)    Urticaria     Past Surgical History:  Procedure Laterality Date   ABDOMINAL HYSTERECTOMY     APPENDECTOMY     APPENDECTOMY       bilateral knees     cataracts     CHOLECYSTECTOMY     mammoplasty reduction     NEUROPLASTY / TRANSPOSITION MEDIAN NERVE AT CARPAL TUNNEL BILATERAL     RE-EXCISION OF BREAST LUMPECTOMY     TUBAL LIGATION       reports that she has never smoked. She has never used smokeless tobacco. She reports that she does not drink alcohol or use drugs.  Allergies  Allergen Reactions   Advicor [Niacin-Lovastatin Er] Other (See Comments)    Reaction unknown to daughter   Caduet [Amlodipine-Atorvastatin] Other (See Comments)    Reaction unknown to daughter   Codeine Other (See Comments)    Reaction unknown to daughter   Dilantin [Phenytoin] Other (See Comments)    Reaction unknown to daughter   Lescol [Fluvastatin] Other (See Comments)    Reaction unknown to daughter   Pamelor [Nortriptyline] Other (See Comments)    Reaction unknown to daughter   Penicillins Hives    Did it involve swelling of the face/tongue/throat, SOB, or low BP? Unk Did it involve sudden or severe rash/hives, skin peeling, or any reaction on the inside of your mouth or nose? Unk Did you need to seek medical attention at a hospital or doctor's office? Unk When did it last happen? "it was a long time ago" If all above  answers are NO, may proceed with cephalosporin use.    Sulfa Antibiotics Hives   Trileptal [Oxcarbazepine] Other (See Comments)    Reaction unknown to daughter   Ultram [Tramadol] Other (See Comments)    Reaction unknown to daughter   Welchol [Colesevelam] Other (See Comments)    Reaction unknown to daughter   Latex Rash   Tomato Rash    Family History  Problem Relation Age of Onset   Diabetes Mother    Heart disease Mother    Heart disease Father    Cancer Sister    Cancer Brother    Cancer Sister      Prior to Admission medications   Medication Sig Start Date End Date Taking? Authorizing Provider  acetaminophen (TYLENOL) 500 MG tablet Take 500-1,000 mg by mouth  every 8 (eight) hours as needed for mild pain or headache.    Yes [provider]  allopurinol (ZYLOPRIM) 100 MG tablet TAKE 1/2 TABLET BY MOUTH EVERY DAY Patient taking differently: Take 50 mg by mouth daily.  10/31/18  Yes Hawks, Christy A, [name]  ARNUITY ELLIPTA 200 MCG/ACT AEPB INHALE 1 PUFF BY MOUTH EVERY DAY Patient taking differently: Inhale 1 puff into the lungs daily.  10/08/18  Yes Kozlow, Donnamarie Poag, MD  aspirin 81 MG tablet Take 81 mg by mouth daily.   Yes [provider]  cetirizine (ZYRTEC) 10 MG tablet TAKE 1 TABLET BY MOUTH EVERY DAY Patient taking differently: Take 10 mg by mouth daily.  12/23/18  Yes Kozlow, Donnamarie Poag, MD  famotidine (PEPCID) 40 MG tablet TAKE 1 TABLET BY MOUTH EVERY DAY Patient taking differently: Take 40 mg by mouth daily.  08/18/18  Yes Kozlow, Donnamarie Poag, MD  ketoconazole (NIZORAL) 2 % cream Apply to the affected areas underneath breasts, belly and in groin region ONCE daily x2-4 weeks. Patient taking differently: Apply 1 application topically See admin instructions. Apply to the affected areas underneath breasts, belly and in groin region ONCE daily for 2-4 weeks 09/16/18  Yes Ronnie Doss M, DO  lamoTRIgine (LAMICTAL) 25 MG tablet Take 2 tablets (50 mg total) by mouth 2 (two) times daily. Patient taking differently: Take 25 mg by mouth 2 (two) times daily.  12/17/18  Yes Kathrynn Ducking, MD  levETIRAcetam (KEPPRA XR) 500 MG 24 hr tablet Take 4 tablets (2,000 mg total) by mouth at bedtime. 11/10/18  Yes Suzzanne Cloud, NP  metolazone (ZAROXOLYN) 2.5 MG tablet TAKE 1 TABLET BY MOUTH ON MONDAYS, WEDNESDAYS, AND FRIDAYS Patient taking differently: Take 2.5 mg by mouth every Monday, Wednesday, and Friday.  10/31/18  Yes Hawks, Christy A, [name]  mometasone (ELOCON) 0.1 % ointment Apply to skin once daily as directed after bathing. 03/04/18  Yes Kozlow, Donnamarie Poag, MD  montelukast (SINGULAIR) 10 MG tablet TAKE ONE TABLET BY MOUTH DAILY AT BEDTIME Patient taking  differently: Take 10 mg by mouth at bedtime.  11/13/18  Yes Kozlow, Donnamarie Poag, MD  Multiple Vitamins-Minerals (ONE-A-DAY WOMENS 50 PLUS) TABS Take 1 tablet by mouth daily with breakfast.   Yes [provider]  omeprazole (PRILOSEC) 40 MG capsule Take 1 capsule (40 mg total) by mouth daily. Patient taking differently: Take 40 mg by mouth daily as needed (for reflux).  07/03/18  Yes Kozlow, Donnamarie Poag, MD  Propylene Glycol (SYSTANE BALANCE OP) Place 1 drop into both eyes 2 (two) times daily.    Yes [provider]  triamcinolone cream (KENALOG) 0.1 % Apply 1 application topically 2 (two) times  daily. Patient taking differently: Apply 1 application topically 2 (two) times daily as needed (for eczema).  03/18/18  Yes Kozlow, Donnamarie Poag, MD  trimethoprim-polymyxin b (POLYTRIM) ophthalmic solution Place 1 drop into the right eye every 6 (six) hours. 12/18/18  Yes Hawks, Christy A, [name]  escitalopram (LEXAPRO) 5 MG tablet Take 1 tablet (5 mg total) by mouth daily for 14 days, THEN 2 tablets (10 mg total) daily for 14 days. Patient not taking: Reported on 12/30/2018 03/17/18 12/30/18  [name], [name]  nystatin-triamcinolone ointment [facility]) Apply 1 application topically 2 (two) times daily. Patient not taking: Reported on 12/30/2018 11/16/15   [name], [name]    Physical Exam: Vitals:   12/30/18 1915 12/30/18 1930 12/30/18 1945 12/30/18 2000  BP: 137/90 (!) 131/103 (!) 134/91 118/82  Pulse:  (!) 106 (!) 106 (!) 105  Resp: 16 16 16 16   Temp:      TempSrc:      SpO2:  94% 95% 95%      Constitutional: Obese pleasant woman in no acute distress Vitals:   12/30/18 1915 12/30/18 1930 12/30/18 1945 12/30/18 2000  BP: 137/90 (!) 131/103 (!) 134/91 118/82  Pulse:  (!) 106 (!) 106 (!) 105  Resp: 16 16 16 16   Temp:      TempSrc:      SpO2:  94% 95% 95%   Eyes: PERRL, lids and conjunctivae normal ENMT: Mucous membranes are moist. Posterior pharynx clear of any exudate or lesions.Normal  dentition.  Neck: normal, supple, no masses, no thyromegaly Respiratory: clear to auscultation bilaterally, no wheezing, no crackles. Normal respiratory effort. No accessory muscle use.  Cardiovascular: Sinus tachycardia, no murmurs / rubs / gallops. No extremity edema. 2+ pedal pulses. No carotid bruits.  Abdomen: no tenderness, no masses palpated. No hepatosplenomegaly. Bowel sounds positive.  Musculoskeletal: no clubbing / cyanosis. No joint deformity upper and lower extremities. Good ROM, no contractures. Normal muscle tone.  Skin: no rashes, lesions, ulcers. No induration Neurologic: CN 2-12 grossly intact. Sensation intact, DTR normal. Strength 5/5 in all 4.  Psychiatric: Normal judgment and insight. Alert and oriented x 3. Normal mood.     Labs on Admission: I have personally reviewed following labs and imaging studies  CBC: Recent Labs  Lab 12/30/18 1258 12/30/18 1930  WBC 14.5* 10.1  HGB 12.1 12.9  HCT 36.1 37.9  MCV 79.0* 78.1*  PLT 243 PLATELET CLUMPS NOTED ON SMEAR, UNABLE TO ESTIMATE   Basic Metabolic Panel: Recent Labs  Lab 12/30/18 1258 12/30/18 1930  NA 138  --   K 3.8  --   CL 100  --   CO2 28  --   GLUCOSE 121*  --   BUN 22  --   CREATININE 1.72* 1.57*  CALCIUM 10.5*  --    GFR: Estimated Creatinine Clearance: 26.6 mL/min (A) (by C-G formula based on SCr of 1.57 mg/dL (H)). Liver Function Tests: Recent Labs  Lab 12/30/18 1559  AST 23  ALT 13  ALKPHOS 48  BILITOT 0.7  PROT 7.3  ALBUMIN 4.0   Recent Labs  Lab 12/30/18 1258  LIPASE 44   No results for input(s): AMMONIA in the last 168 hours. Coagulation Profile: No results for input(s): INR, PROTIME in the last 168 hours. Cardiac Enzymes: No results for input(s): CKTOTAL, CKMB, CKMBINDEX, TROPONINI in the last 168 hours. BNP (last 3 results) No results for input(s): PROBNP in the last 8760 hours. HbA1C: No results for input(s): HGBA1C in the  last 72 hours. CBG: No results for input(s):  GLUCAP in the last 168 hours. Lipid Profile: Recent Labs    12/30/18 1930  CHOL 150  HDL 42  LDLCALC 88  TRIG 98  CHOLHDL 3.6   Thyroid Function Tests: No results for input(s): TSH, T4TOTAL, FREET4, T3FREE, THYROIDAB in the last 72 hours. Anemia Panel: No results for input(s): VITAMINB12, FOLATE, FERRITIN, TIBC, IRON, RETICCTPCT in the last 72 hours. Urine analysis:    Component Value Date/Time   COLORURINE YELLOW 12/30/2018 1541   APPEARANCEUR HAZY (A) 12/30/2018 1541   LABSPEC 1.010 12/30/2018 1541   PHURINE 5.0 12/30/2018 1541   GLUCOSEU NEGATIVE 12/30/2018 1541   HGBUR NEGATIVE 12/30/2018 1541   BILIRUBINUR NEGATIVE 12/30/2018 1541   BILIRUBINUR neg 03/09/2015 1652   KETONESUR NEGATIVE 12/30/2018 1541   PROTEINUR 30 (A) 12/30/2018 1541   UROBILINOGEN negative 03/09/2015 1652   NITRITE NEGATIVE 12/30/2018 1541   LEUKOCYTESUR NEGATIVE 12/30/2018 1541   Sepsis Labs: @LABRCNTIP (procalcitonin:4,lacticidven:4) ) Recent Results (from the past 240 hour(s))  SARS Coronavirus 2 [facility] order, Performed in [facility] hospital lab) Nasopharyngeal Nasopharyngeal Swab     Status: None   Collection Time: 12/30/18  6:56 PM   Specimen: Nasopharyngeal Swab  Result Value Ref Range Status   SARS Coronavirus 2 NEGATIVE NEGATIVE Final    Comment: (NOTE) If result is NEGATIVE SARS-CoV-2 target nucleic acids are NOT DETECTED. The SARS-CoV-2 RNA is generally detectable in upper and lower  respiratory specimens during the acute phase of infection. The lowest  concentration of SARS-CoV-2 viral copies this assay can detect is 250  copies / mL. A negative result does not preclude SARS-CoV-2 infection  and should not be used as the sole basis for treatment or other  patient management decisions.  A negative result may occur with  improper specimen collection / handling, submission of specimen other  than nasopharyngeal swab, presence of viral mutation(s) within the  areas targeted by  this assay, and inadequate number of viral copies  (<250 copies / mL). A negative result must be combined with clinical  observations, patient history, and epidemiological information. If result is POSITIVE SARS-CoV-2 target nucleic acids are DETECTED. The SARS-CoV-2 RNA is generally detectable in upper and lower  respiratory specimens dur ing the acute phase of infection.  Positive  results are indicative of active infection with SARS-CoV-2.  Clinical  correlation with patient history and other diagnostic information is  necessary to determine patient infection status.  Positive results do  not rule out bacterial infection or co-infection with other viruses. If result is PRESUMPTIVE POSTIVE SARS-CoV-2 nucleic acids MAY BE PRESENT.   A presumptive positive result was obtained on the submitted specimen  and confirmed on repeat testing.  While 2019 novel coronavirus  (SARS-CoV-2) nucleic acids may be present in the submitted sample  additional confirmatory testing may be necessary for epidemiological  and / or clinical management purposes  to differentiate between  SARS-CoV-2 and other Sarbecovirus currently known to infect humans.  If clinically indicated additional testing with an alternate test  methodology [phone]) is advised. The SARS-CoV-2 RNA is generally  detectable in upper and lower respiratory sp ecimens during the acute  phase of infection. The expected result is Negative. Fact Sheet for Patients:  StrictlyIdeas.no Fact Sheet for Healthcare Providers: BankingDealers.co.za This test is not yet approved or cleared by the [place] FDA and has been authorized for detection and/or diagnosis of SARS-CoV-2 by FDA under an Emergency Use Authorization (EUA).  This EUA  will remain in effect (meaning this test can be used) for the duration of the COVID-19 declaration under Section 564(b)(1) of the Act, 21 U.S.C. section  360bbb-3(b)(1), unless the authorization is terminated or revoked sooner. Performed at [facility], [place] [address], [place], [place] [address]      Radiological Exams on Admission: Dg Abdomen Acute W/chest  Result Date: 12/30/2018 CLINICAL DATA:  Chest pain and vomiting EXAM: DG ABDOMEN ACUTE W/ 1V CHEST COMPARISON:  Chest radiograph April 14, 2018; abdomen radiographs March 10, 2016 FINDINGS: PA chest: There is no edema or consolidation. Heart is slightly enlarged with pulmonary vascularity normal. No adenopathy. There is degenerative change in each shoulder. Supine and left lateral decubitus abdomen: There is moderate stool in the colon. There is no bowel dilatation or air-fluid level to suggest bowel obstruction. No evident free air. There is degenerative change in the lumbar spine. IMPRESSION: No bowel obstruction or free air evident. No lung edema or consolidation. Mild cardiac enlargement. Electronically Signed   By: Lowella Grip III M.D.   On: 12/30/2018 13:46    EKG: Independently reviewed.  It showed supraventricular tachycardia with a rate of 123.  No significant ST changes  Assessment/Plan Principal Problem:   Chest pain Active Problems:   Essential hypertension, benign   Anemia associated with chronic renal failure   Chronic kidney disease (CKD), stage IV (severe) (HCC)   Morbid obesity ([place])   History of stroke     #1 chest pain: Atypical although now radiation to the left arm.  Patient has risk factors for coronary artery disease.  Although symptoms started after a meal which could mean GERD patient will also receive chemicals to dilate her eyes which could have triggered the heart complaints.  We will also consider possible PE but less likely.  Admit the patient for rule out MI.  Nitroglycerin as needed beta-blockers as tolerated.  #2 hypertension: We will continue with her home regimen and adjust accordingly.  #4 chronic kidney disease stage IV:  Appears to be close to baseline.  Still hydrate slowly.  #4 morbid obesity: Stable at baseline  #5 anemia of chronic disease: H&H is stable.  Monitor especially with IV fluids use.  #6 elevated troponins: Mild elevation.  Most likely due to demand ischemia   DVT prophylaxis: Heparin Code Status: Full code Family Communication: No family available discussed care with patient Disposition Plan: Home Consults called: None Admission status: Observation  Severity of Illness: The appropriate patient status for this patient is OBSERVATION. Observation status is judged to be reasonable and necessary in order to provide the required intensity of service to ensure the patient's safety. The patient's presenting symptoms, physical exam findings, and initial radiographic and laboratory data in the context of their medical condition is felt to place them at decreased risk for further clinical deterioration. Furthermore, it is anticipated that the patient will be medically stable for discharge from the hospital within 2 midnights of admission. The following factors support the patient status of observation.   " The patient's presenting symptoms include chest pain and palpitations. " The physical exam findings include palpitations. " The initial radiographic and laboratory data are mildly elevated troponins.     Barbette Merino MD [facility] Pager [phone]  If 7PM-7AM, please contact night-coverage [email or web address] Password [email or web address]  12/30/2018, 8:30 PM

## 2018-12-30 NOTE — ED Notes (Signed)
Admitting MD ( Dr. Jonelle Sidle ) notified on elevated D- dimer result.

## 2018-12-30 NOTE — ED Notes (Signed)
Glennda Weatherholtz, daughter, [phone] would like updates

## 2018-12-30 NOTE — ED Provider Notes (Addendum)
Care assumed at shift change from [place], [place], pending troponin and re-evaluation. See her note for full HPI and workup. Briefly, pt presenting with with CP and emesis after eating [employer]. She went to eye doctor afterwards then shortly after began feeling nausea and chest tightness with 1 episode of vomiting. Sx resolved upon arrival, though complains of ongoing left arm pain, though is not new after multiple recent falls. Cardiac and GI initiated workup. Recent cardiology eval, normal stress and echo. Plan to follow up labs. If trop if >18, plan to consult cardiology for possible admission. Physical Exam  BP (!) 161/95   Pulse 68   Temp 97.6 F (36.4 C) (Oral)   Resp 13   SpO2 96%   Physical Exam Vitals signs and nursing note reviewed.  Constitutional:      General: She is not in acute distress.    Appearance: She is well-developed.  HENT:     Head: Normocephalic and atraumatic.  Eyes:     Conjunctiva/sclera: Conjunctivae normal.  Cardiovascular:     Rate and Rhythm: Regular rhythm. Tachycardia present.  Pulmonary:     Effort: Pulmonary effort is normal.  Abdominal:     Palpations: Abdomen is soft.  Skin:    General: Skin is warm.  Neurological:     Mental Status: She is alert.  Psychiatric:        Behavior: Behavior normal.    Results for orders placed or performed during the hospital encounter of 12/30/18  SARS Coronavirus 2 [place] order, Performed in [facility] hospital lab) Nasopharyngeal Nasopharyngeal Swab   Specimen: Nasopharyngeal Swab  Result Value Ref Range   SARS Coronavirus 2 NEGATIVE NEGATIVE  Basic metabolic panel  Result Value Ref Range   Sodium 138 135 - 145 mmol/L   Potassium 3.8 3.5 - 5.1 mmol/L   Chloride 100 98 - 111 mmol/L   CO2 28 22 - 32 mmol/L   Glucose, Bld 121 (H) 70 - 99 mg/dL   BUN 22 8 - 23 mg/dL   Creatinine, Ser 1.72 (H) 0.44 - 1.00 mg/dL   Calcium 10.5 (H) 8.9 - 10.3 mg/dL   GFR calc non Af Amer 25 (L) >60 mL/min   GFR calc Af  Amer 29 (L) >60 mL/min   Anion gap 10 5 - 15  CBC  Result Value Ref Range   WBC 14.5 (H) 4.0 - 10.5 K/uL   RBC 4.57 3.87 - 5.11 MIL/uL   Hemoglobin 12.1 12.0 - 15.0 g/dL   HCT 36.1 36.0 - 46.0 %   MCV 79.0 (L) 80.0 - 100.0 fL   MCH 26.5 26.0 - 34.0 pg   MCHC 33.5 30.0 - 36.0 g/dL   RDW 18.6 (H) 11.5 - 15.5 %   Platelets 243 150 - 400 K/uL   nRBC 0.3 (H) 0.0 - 0.2 %  Lipase, blood  Result Value Ref Range   Lipase 44 11 - 51 U/L  Hepatic function panel  Result Value Ref Range   Total Protein 7.3 6.5 - 8.1 g/dL   Albumin 4.0 3.5 - 5.0 g/dL   AST 23 15 - 41 U/L   ALT 13 0 - 44 U/L   Alkaline Phosphatase 48 38 - 126 U/L   Total Bilirubin 0.7 0.3 - 1.2 mg/dL   Bilirubin, Direct 0.1 0.0 - 0.2 mg/dL   Indirect Bilirubin 0.6 0.3 - 0.9 mg/dL  Urinalysis, Routine w reflex microscopic  Result Value Ref Range   Color, Urine YELLOW YELLOW   APPearance  HAZY (A) CLEAR   Specific Gravity, Urine 1.010 1.005 - 1.030   pH 5.0 5.0 - 8.0   Glucose, UA NEGATIVE NEGATIVE mg/dL   Hgb urine dipstick NEGATIVE NEGATIVE   Bilirubin Urine NEGATIVE NEGATIVE   Ketones, ur NEGATIVE NEGATIVE mg/dL   Protein, ur 30 (A) NEGATIVE mg/dL   Nitrite NEGATIVE NEGATIVE   Leukocytes,Ua NEGATIVE NEGATIVE   RBC / HPF 0-5 0 - 5 RBC/hpf   WBC, UA 0-5 0 - 5 WBC/hpf   Bacteria, UA NONE SEEN NONE SEEN   Squamous Epithelial / LPF 0-5 0 - 5   Hyaline Casts, UA PRESENT   D-dimer, quantitative  Result Value Ref Range   D-Dimer, Quant 3.37 (H) 0.00 - 0.50 ug/mL-FEU  CBC  Result Value Ref Range   WBC 10.1 4.0 - 10.5 K/uL   RBC 4.85 3.87 - 5.11 MIL/uL   Hemoglobin 12.9 12.0 - 15.0 g/dL   HCT 37.9 36.0 - 46.0 %   MCV 78.1 (L) 80.0 - 100.0 fL   MCH 26.6 26.0 - 34.0 pg   MCHC 34.0 30.0 - 36.0 g/dL   RDW 18.7 (H) 11.5 - 15.5 %   Platelets PLATELET CLUMPS NOTED ON SMEAR, UNABLE TO ESTIMATE 150 - 400 K/uL   nRBC 0.5 (H) 0.0 - 0.2 %  Creatinine, serum  Result Value Ref Range   Creatinine, Ser 1.57 (H) 0.44 - 1.00 mg/dL    GFR calc non Af Amer 28 (L) >60 mL/min   GFR calc Af Amer 33 (L) >60 mL/min  Lipid panel  Result Value Ref Range   Cholesterol 150 0 - 200 mg/dL   Triglycerides 98 <150 mg/dL   HDL 42 >40 mg/dL   Total CHOL/HDL Ratio 3.6 RATIO   VLDL 20 0 - 40 mg/dL   LDL Cholesterol 88 0 - 99 mg/dL  Troponin I (High Sensitivity)  Result Value Ref Range   Troponin I (High Sensitivity) 17 <18 ng/L  Troponin I (High Sensitivity)  Result Value Ref Range   Troponin I (High Sensitivity) 16 <18 ng/L  Troponin I (High Sensitivity)  Result Value Ref Range   Troponin I (High Sensitivity) 20 (H) <18 ng/L  Troponin I (High Sensitivity)  Result Value Ref Range   Troponin I (High Sensitivity) 28 (H) <18 ng/L   Dg Abdomen Acute W/chest  Result Date: 12/30/2018 CLINICAL DATA:  Chest pain and vomiting EXAM: DG ABDOMEN ACUTE W/ 1V CHEST COMPARISON:  Chest radiograph April 14, 2018; abdomen radiographs March 10, 2016 FINDINGS: PA chest: There is no edema or consolidation. Heart is slightly enlarged with pulmonary vascularity normal. No adenopathy. There is degenerative change in each shoulder. Supine and left lateral decubitus abdomen: There is moderate stool in the colon. There is no bowel dilatation or air-fluid level to suggest bowel obstruction. No evident free air. There is degenerative change in the lumbar spine. IMPRESSION: No bowel obstruction or free air evident. No lung edema or consolidation. Mild cardiac enlargement. Electronically Signed   By: Lowella Grip III M.D.   On: 12/30/2018 13:46     ED Course/Procedures   Clinical Course as of Dec 31 1234  Tue Dec 30, 2018  1315 Elderly patient with brief episode of chest pain with emesis after eating [employer]. EKG confirmed NSR by myself and Dr. Ashok Cordia. Now having persistent L arm pain which is not new and which she attributes to arm injury recently. Will obtain cardiac workup as well as abdominal pain workup. She appears stable. Of note, she  was  seen for chest pain by cardiology this year in February and had normal stress test and echo and thought to not need any further cardiac workup at that time. Heart score 6 and initial trop 17.    [KM]  1542 Patient resting comfortably. Waiting on second trop as well as labs. I do not feel this event is cardiac related. She may need consult pending her 2nd trop. Care handed to [place] Robinson PA-C due to change of shift    [KM]  1730 Consulted with cardiology, spoke with Dr. Radford Pax.  Recommends medical admission and dose of Lopressor for tachycardia.   [JR]  9381 Patient became tachycardic pending repeat troponin.  Repeat troponin resulted as 16.  Had discussion with patient.  It appears she may be having recurrence of chest tightness or pain though she does endorse some shortness of breath.  Patient is somewhat this difficult historian.  Will consult cardiology for recommendations and add on d-dimer.   [JR]    Clinical Course User Index [JR] Robinson, [place] N, PA-C [KM] Alveria Apley, PA-C    Procedures  MDM  Pt became tachycardic pending repeat troponin and remained tachycardic in the 120s-130s. EKG with atrial/ectopic tachyarrhythmia.  Does complain of some SOB and recurrent chest tightness, though pt is somewhat a difficult historian. Discussed with cardiology, pt does not require cardiology admit, though should be admitted to medicine. Lopressor given per cardiology recommendation. D-dimer pending, Cr elevated. Dr. Jonelle Sidle accepting.      Robinson, [place] N, PA-C 12/30/18 1950    Lajean Saver, MD 12/31/18 0719    Alveria Apley, PA-C 12/31/18 1236    Lajean Saver, MD 01/01/19 1150

## 2018-12-31 ENCOUNTER — Inpatient Hospital Stay (HOSPITAL_COMMUNITY): Payer: Medicare Other

## 2018-12-31 ENCOUNTER — Observation Stay (HOSPITAL_COMMUNITY): Payer: Medicare Other

## 2018-12-31 DIAGNOSIS — Z8673 Personal history of transient ischemic attack (TIA), and cerebral infarction without residual deficits: Secondary | ICD-10-CM

## 2018-12-31 DIAGNOSIS — N184 Chronic kidney disease, stage 4 (severe): Secondary | ICD-10-CM | POA: Diagnosis not present

## 2018-12-31 DIAGNOSIS — I10 Essential (primary) hypertension: Secondary | ICD-10-CM

## 2018-12-31 DIAGNOSIS — I340 Nonrheumatic mitral (valve) insufficiency: Secondary | ICD-10-CM | POA: Diagnosis not present

## 2018-12-31 DIAGNOSIS — R0789 Other chest pain: Secondary | ICD-10-CM | POA: Diagnosis not present

## 2018-12-31 DIAGNOSIS — I371 Nonrheumatic pulmonary valve insufficiency: Secondary | ICD-10-CM | POA: Diagnosis not present

## 2018-12-31 DIAGNOSIS — N189 Chronic kidney disease, unspecified: Secondary | ICD-10-CM | POA: Diagnosis not present

## 2018-12-31 DIAGNOSIS — I129 Hypertensive chronic kidney disease with stage 1 through stage 4 chronic kidney disease, or unspecified chronic kidney disease: Secondary | ICD-10-CM | POA: Diagnosis not present

## 2018-12-31 DIAGNOSIS — R079 Chest pain, unspecified: Secondary | ICD-10-CM | POA: Diagnosis not present

## 2018-12-31 LAB — ECHOCARDIOGRAM COMPLETE

## 2018-12-31 MED ORDER — PANTOPRAZOLE SODIUM 40 MG PO TBEC
40.0000 mg | DELAYED_RELEASE_TABLET | Freq: Every day | ORAL | Status: DC
  Administered 2018-12-31 – 2019-01-01 (×2): 40 mg via ORAL
  Filled 2018-12-31 (×3): qty 1

## 2018-12-31 MED ORDER — TECHNETIUM TO 99M ALBUMIN AGGREGATED
1.5000 | Freq: Once | INTRAVENOUS | Status: AC | PRN
  Administered 2018-12-31: 1.5 via INTRAVENOUS

## 2018-12-31 MED ORDER — TRIAMCINOLONE ACETONIDE 0.1 % EX CREA: 1.0000 "application " | TOPICAL_CREAM | Freq: Two times a day (BID) | CUTANEOUS | Status: DC | PRN

## 2018-12-31 MED ORDER — ADULT MULTIVITAMIN W/MINERALS CH
1.0000 | ORAL_TABLET | Freq: Every day | ORAL | Status: DC
  Administered 2018-12-31 – 2019-01-01 (×2): 1 via ORAL
  Filled 2018-12-31 (×2): qty 1

## 2018-12-31 MED ORDER — LAMOTRIGINE 25 MG PO TABS
25.0000 mg | ORAL_TABLET | Freq: Two times a day (BID) | ORAL | Status: DC
  Administered 2018-12-31 – 2019-01-01 (×3): 25 mg via ORAL
  Filled 2018-12-31 (×5): qty 1

## 2018-12-31 MED ORDER — ESCITALOPRAM OXALATE 10 MG PO TABS
10.0000 mg | ORAL_TABLET | Freq: Every day | ORAL | Status: DC
  Administered 2018-12-31 – 2019-01-01 (×2): 10 mg via ORAL
  Filled 2018-12-31 (×2): qty 1

## 2018-12-31 MED ORDER — HYDRALAZINE HCL 20 MG/ML IJ SOLN: 5.0000 mg | Freq: Four times a day (QID) | INTRAMUSCULAR | Status: DC | PRN

## 2018-12-31 MED ORDER — BUDESONIDE 0.25 MG/2ML IN SUSP
0.2500 mg | Freq: Two times a day (BID) | RESPIRATORY_TRACT | Status: DC
  Administered 2018-12-31 – 2019-01-01 (×2): 0.25 mg via RESPIRATORY_TRACT
  Filled 2018-12-31 (×4): qty 2

## 2018-12-31 MED ORDER — ASPIRIN EC 81 MG PO TBEC
81.0000 mg | DELAYED_RELEASE_TABLET | Freq: Every day | ORAL | Status: DC
  Administered 2018-12-31 – 2019-01-01 (×2): 81 mg via ORAL
  Filled 2018-12-31 (×3): qty 1

## 2018-12-31 MED ORDER — KETOCONAZOLE 2 % EX CREA
1.0000 "application " | TOPICAL_CREAM | Freq: Every day | CUTANEOUS | Status: DC
  Administered 2018-12-31 – 2019-01-01 (×2): 1 via TOPICAL
  Filled 2018-12-31: qty 15

## 2018-12-31 MED ORDER — NEOMYCIN-POLYMYXIN-GRAMICIDIN 1.75-10000-.025 OP SOLN
1.0000 [drp] | Freq: Four times a day (QID) | OPHTHALMIC | Status: DC
  Administered 2018-12-31 – 2019-01-01 (×5): 1 [drp] via OPHTHALMIC
  Filled 2018-12-31: qty 10

## 2018-12-31 MED ORDER — ALLOPURINOL 100 MG PO TABS
50.0000 mg | ORAL_TABLET | Freq: Every day | ORAL | Status: DC
  Administered 2018-12-31 – 2019-01-01 (×2): 50 mg via ORAL
  Filled 2018-12-31: qty 0.5
  Filled 2018-12-31: qty 1

## 2018-12-31 MED ORDER — METOLAZONE 2.5 MG PO TABS
2.5000 mg | ORAL_TABLET | ORAL | Status: DC
  Administered 2018-12-31: 10:00:00 2.5 mg via ORAL
  Filled 2018-12-31: qty 1

## 2018-12-31 MED ORDER — LORATADINE 10 MG PO TABS
10.0000 mg | ORAL_TABLET | Freq: Every day | ORAL | Status: DC
  Administered 2018-12-31 – 2019-01-01 (×2): 10 mg via ORAL
  Filled 2018-12-31 (×3): qty 1

## 2018-12-31 MED ORDER — MONTELUKAST SODIUM 10 MG PO TABS
10.0000 mg | ORAL_TABLET | Freq: Every day | ORAL | Status: DC
  Administered 2018-12-31: 10 mg via ORAL
  Filled 2018-12-31 (×2): qty 1

## 2018-12-31 MED ORDER — FAMOTIDINE 20 MG PO TABS
40.0000 mg | ORAL_TABLET | Freq: Every day | ORAL | Status: DC
  Administered 2018-12-31 – 2019-01-01 (×2): 40 mg via ORAL
  Filled 2018-12-31 (×2): qty 2

## 2018-12-31 MED ORDER — LEVETIRACETAM ER 500 MG PO TB24
2000.0000 mg | ORAL_TABLET | Freq: Every day | ORAL | Status: DC
  Administered 2018-12-31: 23:00:00 2000 mg via ORAL
  Filled 2018-12-31 (×2): qty 4

## 2018-12-31 MED ORDER — TRIAMCINOLONE ACETONIDE 0.1 % EX OINT
TOPICAL_OINTMENT | Freq: Every day | CUTANEOUS | Status: DC
  Administered 2018-12-31 – 2019-01-01 (×2): via TOPICAL
  Filled 2018-12-31: qty 15

## 2018-12-31 MED ORDER — POLYVINYL ALCOHOL 1.4 % OP SOLN
1.0000 [drp] | Freq: Two times a day (BID) | OPHTHALMIC | Status: DC
  Administered 2018-12-31 – 2019-01-01 (×3): 1 [drp] via OPHTHALMIC
  Filled 2018-12-31 (×2): qty 15

## 2018-12-31 MED ORDER — POLYMYXIN B-TRIMETHOPRIM 10000-0.1 UNIT/ML-% OP SOLN
1.0000 [drp] | Freq: Four times a day (QID) | OPHTHALMIC | Status: DC
  Filled 2018-12-31: qty 10

## 2018-12-31 MED ORDER — ENOXAPARIN SODIUM 30 MG/0.3ML ~~LOC~~ SOLN
30.0000 mg | SUBCUTANEOUS | Status: DC
  Filled 2018-12-31: qty 0.3

## 2018-12-31 NOTE — Progress Notes (Signed)
Chaplain was heading back to on-call room when [name] called out for help. The nursing staff were all in other areas, so the chaplain stepped in to see what [name] needed. Chaplain reassured [name] she had not been forgotten and someone would assist her with getting cleaned up soon. [name] was thankful to the chaplain for stopping in. Chaplain remains available per request.   Chaplain Resident, Evelene Croon, M Div Pager # [phone]

## 2018-12-31 NOTE — ED Notes (Signed)
Pt set up to side of bed so she can eat. Tolerating well.

## 2018-12-31 NOTE — Progress Notes (Signed)
  Echocardiogram 2D Echocardiogram has been performed.  [name] 12/31/2018, 1:44 PM

## 2018-12-31 NOTE — ED Notes (Signed)
Purwick in place, yellow socks applied to pt. Pt adjusted in bed.

## 2018-12-31 NOTE — Progress Notes (Signed)
PROGRESS NOTE  [name] CZY:[id number] DOB: 1924/01/12 DOA: 12/30/2018 PCP: [name], [name]  HPI/Recap of past 24 hours: HPI from Dr Gaylord Shih is a 85 y.o. female with medical history significant of hypertension, obesity, degenerative disc disease, gout, seizure disorder, history of CVA who presents to the ER with chest pain.  She apparently went to eat a sandwich at [employer] this morning started having chest discomfort.  She was also seen by the eye doctors after her breakfast for eye exam.  During that.  Her eyes were dilated with chemicals.  She subsequently started having chest tightness and then vomited.  Patient's pain was rated as 5 out of 10.  Radiated to her left arm.  Associated with diaphoresis as well as the nausea and vomiting.  She appears to have risk factors for coronary artery disease.  She responded to nitroglycerin here.  Symptoms have largely disappeared at this point.  Patient will be admitted for rule out MI. In the ED, initial troponin was 20 then 28.  Creatinine 1.57, d-dimer 3.37.  EKG showed sinus tachycardia.  Patient admitted for further management.    Today, patient denies any chest discomfort, does report heartburn after she eats.  Still complaining of left forearm pain.  Denies pain short of breath, denies any further nausea/vomiting, cough, dizziness, diaphoresis.   Assessment/Plan: Principal Problem:   Chest pain Active Problems:   Essential hypertension, benign   Anemia associated with chronic renal failure   Chronic kidney disease (CKD), stage IV (severe) (HCC)   Morbid obesity ([place])   History of stroke  ??Atypical chest pain Currently chest pain-free Troponins 20--> 28 EKG with sinus tachycardia, no acute ST changes, will repeat Echo pending May consider nuclear stress test in a.m., will keep n.p.o. Continue aspirin, metoprolol, nitroglycerin as needed  Elevated d-dimer VQ scan pending  Hypertension Uncontrolled Not on  any meds at home, only metolazone IV hydralazine PRN  CKD stage IV Currently at baseline Discontinue IV fluids Daily BMP  Anemia of chronic kidney disease Hemoglobin at baseline Daily CBC  Seizure disorder Continue home Keppra  GERD Continue Pepcid, pantoprazole  Morbid obesity Lifestyle modification advised  History of frequent falls Patient reported frequent falls, likely mechanical Patient lives alone PT/OT        Malnutrition Type:      Malnutrition Characteristics:      Nutrition Interventions:       Estimated body mass index is 40.27 kg/m as calculated from the following:   Height as of 12/18/18: 5\' 4"  (1.626 m).   Weight as of 12/18/18: 106.4 kg.     Code Status: Full  Family Communication: None at bedside  Disposition Plan: To be determined   Consultants:  None  Procedures:  None  Antimicrobials:  None  DVT prophylaxis: Lovenox   Objective: Vitals:   12/31/18 0930 12/31/18 1030 12/31/18 1145 12/31/18 1211  BP:    (!) 161/95  Pulse: 68   68  Resp: 16 15 16 13   Temp:      TempSrc:      SpO2: 100%   96%    Intake/Output Summary (Last 24 hours) at 12/31/2018 1437 Last data filed at 12/30/2018 1950 Gross per 24 hour  Intake -  Output 800 ml  Net -800 ml   There were no vitals filed for this visit.  Exam:  General: NAD   Cardiovascular: S1, S2 present  Respiratory:  Diminished breath sounds bilaterally  Abdomen: Soft, nontender, nondistended,  bowel sounds present  Musculoskeletal: No bilateral pedal edema noted  Skin:  Dry  Psychiatry: Normal mood   Data Reviewed: CBC: Recent Labs  Lab 12/30/18 1258 12/30/18 1930  WBC 14.5* 10.1  HGB 12.1 12.9  HCT 36.1 37.9  MCV 79.0* 78.1*  PLT 243 PLATELET CLUMPS NOTED ON SMEAR, UNABLE TO ESTIMATE   Basic Metabolic Panel: Recent Labs  Lab 12/30/18 1258 12/30/18 1930  NA 138  --   K 3.8  --   CL 100  --   CO2 28  --   GLUCOSE 121*  --   BUN 22  --    CREATININE 1.72* 1.57*  CALCIUM 10.5*  --    GFR: Estimated Creatinine Clearance: 26.6 mL/min (A) (by C-G formula based on SCr of 1.57 mg/dL (H)). Liver Function Tests: Recent Labs  Lab 12/30/18 1559  AST 23  ALT 13  ALKPHOS 48  BILITOT 0.7  PROT 7.3  ALBUMIN 4.0   Recent Labs  Lab 12/30/18 1258  LIPASE 44   No results for input(s): AMMONIA in the last 168 hours. Coagulation Profile: No results for input(s): INR, PROTIME in the last 168 hours. Cardiac Enzymes: No results for input(s): CKTOTAL, CKMB, CKMBINDEX, TROPONINI in the last 168 hours. BNP (last 3 results) No results for input(s): PROBNP in the last 8760 hours. HbA1C: No results for input(s): HGBA1C in the last 72 hours. CBG: No results for input(s): GLUCAP in the last 168 hours. Lipid Profile: Recent Labs    12/30/18 1930  CHOL 150  HDL 42  LDLCALC 88  TRIG 98  CHOLHDL 3.6   Thyroid Function Tests: No results for input(s): TSH, T4TOTAL, FREET4, T3FREE, THYROIDAB in the last 72 hours. Anemia Panel: No results for input(s): VITAMINB12, FOLATE, FERRITIN, TIBC, IRON, RETICCTPCT in the last 72 hours. Urine analysis:    Component Value Date/Time   COLORURINE YELLOW 12/30/2018 1541   APPEARANCEUR HAZY (A) 12/30/2018 1541   LABSPEC 1.010 12/30/2018 1541   PHURINE 5.0 12/30/2018 1541   GLUCOSEU NEGATIVE 12/30/2018 1541   HGBUR NEGATIVE 12/30/2018 1541   BILIRUBINUR NEGATIVE 12/30/2018 1541   BILIRUBINUR neg 03/09/2015 1652   KETONESUR NEGATIVE 12/30/2018 1541   PROTEINUR 30 (A) 12/30/2018 1541   UROBILINOGEN negative 03/09/2015 1652   NITRITE NEGATIVE 12/30/2018 1541   LEUKOCYTESUR NEGATIVE 12/30/2018 1541   Sepsis Labs: @LABRCNTIP (procalcitonin:4,lacticidven:4)  ) Recent Results (from the past 240 hour(s))  SARS Coronavirus 2 [facility] order, Performed in [facility] hospital lab) Nasopharyngeal Nasopharyngeal Swab     Status: None   Collection Time: 12/30/18  6:56 PM   Specimen: Nasopharyngeal  Swab  Result Value Ref Range Status   SARS Coronavirus 2 NEGATIVE NEGATIVE Final    Comment: (NOTE) If result is NEGATIVE SARS-CoV-2 target nucleic acids are NOT DETECTED. The SARS-CoV-2 RNA is generally detectable in upper and lower  respiratory specimens during the acute phase of infection. The lowest  concentration of SARS-CoV-2 viral copies this assay can detect is 250  copies / mL. A negative result does not preclude SARS-CoV-2 infection  and should not be used as the sole basis for treatment or other  patient management decisions.  A negative result may occur with  improper specimen collection / handling, submission of specimen other  than nasopharyngeal swab, presence of viral mutation(s) within the  areas targeted by this assay, and inadequate number of viral copies  (<250 copies / mL). A negative result must be combined with clinical  observations, patient history, and epidemiological information.  If result is POSITIVE SARS-CoV-2 target nucleic acids are DETECTED. The SARS-CoV-2 RNA is generally detectable in upper and lower  respiratory specimens dur ing the acute phase of infection.  Positive  results are indicative of active infection with SARS-CoV-2.  Clinical  correlation with patient history and other diagnostic information is  necessary to determine patient infection status.  Positive results do  not rule out bacterial infection or co-infection with other viruses. If result is PRESUMPTIVE POSTIVE SARS-CoV-2 nucleic acids MAY BE PRESENT.   A presumptive positive result was obtained on the submitted specimen  and confirmed on repeat testing.  While 2019 novel coronavirus  (SARS-CoV-2) nucleic acids may be present in the submitted sample  additional confirmatory testing may be necessary for epidemiological  and / or clinical management purposes  to differentiate between  SARS-CoV-2 and other Sarbecovirus currently known to infect humans.  If clinically indicated  additional testing with an alternate test  methodology [phone]) is advised. The SARS-CoV-2 RNA is generally  detectable in upper and lower respiratory sp ecimens during the acute  phase of infection. The expected result is Negative. Fact Sheet for Patients:  StrictlyIdeas.no Fact Sheet for Healthcare Providers: BankingDealers.co.za This test is not yet approved or cleared by the [place] FDA and has been authorized for detection and/or diagnosis of SARS-CoV-2 by FDA under an Emergency Use Authorization (EUA).  This EUA will remain in effect (meaning this test can be used) for the duration of the COVID-19 declaration under Section 564(b)(1) of the Act, 21 U.S.C. section 360bbb-3(b)(1), unless the authorization is terminated or revoked sooner. Performed at [facility], [place] [address] [place], [place] [address]       Studies: No results found.  Scheduled Meds: . allopurinol  50 mg Oral Daily  . aspirin EC  81 mg Oral Daily  . budesonide  0.25 mg Inhalation BID  . enoxaparin (LOVENOX) injection  30 mg Subcutaneous Q24H  . escitalopram  10 mg Oral Daily  . famotidine  40 mg Oral Daily  . ketoconazole  1 application Topical Daily  . lamoTRIgine  25 mg Oral BID  . levETIRAcetam  2,000 mg Oral QHS  . loratadine  10 mg Oral Daily  . metolazone  2.5 mg Oral Q M,W,F  . metoprolol tartrate  25 mg Oral BID  . montelukast  10 mg Oral QHS  . multivitamin with minerals  1 tablet Oral Q breakfast  . neomycin-polymyxin-gramicidin  1 drop Right Eye Q6H  . pantoprazole  40 mg Oral Daily  . polyvinyl alcohol  1 drop Both Eyes BID  . triamcinolone ointment   Topical Daily    Continuous Infusions: . sodium chloride 50 mL/hr at 12/30/18 1941     LOS: 0 days     Alma Friendly, MD [facility]  If 7PM-7AM, please contact night-coverage [email or web address] 12/31/2018, 2:37 PM

## 2018-12-31 NOTE — ED Notes (Signed)
Pharmacy notified on pt.'s Neosporin eye drop .

## 2018-12-31 NOTE — ED Notes (Signed)
Pt taken to nuclear medicine

## 2018-12-31 NOTE — ED Notes (Signed)
ED TO INPATIENT HANDOFF REPORT  ED Nurse Name and Phone #: Ardelle Park [phone]  S Name/Age/Gender [name] [birth year] 85 y.o. female Room/Bed: RESUSC/RESUSC  Code Status   Code Status: Full Code  Home/SNF/Other Home Patient oriented to: time Is this baseline? Yes   Triage Complete: Triage complete  Chief Complaint cp  Triage Note Pt arrives to ED from her eye doctor appointment with complaints of sudden onset generalized chest pain that radiates to her left arm. After the chest pain the patiently had one large episode of emesis. Patient states she ate a biscuit and coffee this morning. Pt received 324 ASA vis EMS.    Allergies Allergies  Allergen Reactions  . Advicor [Niacin-Lovastatin Er] Other (See Comments)    Reaction unknown to daughter  . Caduet [Amlodipine-Atorvastatin] Other (See Comments)    Reaction unknown to daughter  . Codeine Other (See Comments)    Reaction unknown to daughter  . Dilantin [Phenytoin] Other (See Comments)    Reaction unknown to daughter  . Lescol [Fluvastatin] Other (See Comments)    Reaction unknown to daughter  . Pamelor [Nortriptyline] Other (See Comments)    Reaction unknown to daughter  . Penicillins Hives    Did it involve swelling of the face/tongue/throat, SOB, or low BP? Unk Did it involve sudden or severe rash/hives, skin peeling, or any reaction on the inside of your mouth or nose? Unk Did you need to seek medical attention at a hospital or doctor's office? Unk When did it last happen? "it was a long time ago" If all above answers are "NO", may proceed with cephalosporin use.   . Sulfa Antibiotics Hives  . Trileptal [Oxcarbazepine] Other (See Comments)    Reaction unknown to daughter  . Ultram [Tramadol] Other (See Comments)    Reaction unknown to daughter  . Welchol [Colesevelam] Other (See Comments)    Reaction unknown to daughter  . Latex Rash  . Tomato Rash    Level of Care/Admitting Diagnosis ED Disposition     ED Disposition Condition Comment   Admit  Hospital Area: [facility] [[id number]]  Level of Care: Telemetry Cardiac [[id number]]  I expect the patient will be discharged within 24 hours: Yes  LOW acuity---Tx typically complete <24 hrs---ACUTE conditions typically can be evaluated <24 hours---LABS likely to return to acceptable levels <24 hours---IS near functional baseline---EXPECTED to return to current living arrangement---NOT newly hypoxic: Meets criteria for 5C-Observation unit  Covid Evaluation: Asymptomatic Screening Protocol (No Symptoms)  Diagnosis: Chest pain [829937]  Admitting Physician: Elwyn Reach [[id number]]  Attending Physician: Elwyn Reach [[id number]]  PT Class (Do Not Modify): Observation [104]  PT Acc Code (Do Not Modify): Observation [10022]       B Medical/Surgery History Past Medical History:  Diagnosis Date  . Chronic low back pain   . Degenerative arthritis   . Eczema   . Edema   . Gout   . Headache   . Hiatal hernia   . Hypertension   . Internal hemorrhoids   . Neurogenic claudication   . Obesity   . Seizure disorder ([place])   . Seizures ([place])   . Stroke ([place])   . Urticaria    Past Surgical History:  Procedure Laterality Date  . ABDOMINAL HYSTERECTOMY    . APPENDECTOMY    . APPENDECTOMY    . bilateral knees    . cataracts    . CHOLECYSTECTOMY    . mammoplasty reduction    . NEUROPLASTY /  TRANSPOSITION MEDIAN NERVE AT CARPAL TUNNEL BILATERAL    . RE-EXCISION OF BREAST LUMPECTOMY    . TUBAL LIGATION       A IV Location/Drains/Wounds Patient Lines/Drains/Airways Status   Active Line/Drains/Airways    Name:   Placement date:   Placement time:   Site:   Days:   Peripheral IV 12/30/18 Left Hand   12/30/18    1306    Hand   1   Peripheral IV 12/30/18 Right Forearm   12/30/18    1826    Forearm   1          Intake/Output Last 24 hours  Intake/Output Summary (Last 24 hours) at 12/31/2018 1149 Last data filed at 12/30/2018 1950 Gross  per 24 hour  Intake -  Output 800 ml  Net -800 ml    Labs/Imaging Results for orders placed or performed during the hospital encounter of 12/30/18 (from the past 48 hour(s))  Basic metabolic panel     Status: Abnormal   Collection Time: 12/30/18 12:58 PM  Result Value Ref Range   Sodium 138 135 - 145 mmol/L   Potassium 3.8 3.5 - 5.1 mmol/L   Chloride 100 98 - 111 mmol/L   CO2 28 22 - 32 mmol/L   Glucose, Bld 121 (H) 70 - 99 mg/dL   BUN 22 8 - 23 mg/dL   Creatinine, Ser 1.72 (H) 0.44 - 1.00 mg/dL   Calcium 10.5 (H) 8.9 - 10.3 mg/dL   GFR calc non Af Amer 25 (L) >60 mL/min   GFR calc Af Amer 29 (L) >60 mL/min   Anion gap 10 5 - 15    Comment: Performed at [facility], [address], [place], [place] [address]  CBC     Status: Abnormal   Collection Time: 12/30/18 12:58 PM  Result Value Ref Range   WBC 14.5 (H) 4.0 - 10.5 K/uL    Comment: WHITE COUNT CONFIRMED ON SMEAR   RBC 4.57 3.87 - 5.11 MIL/uL   Hemoglobin 12.1 12.0 - 15.0 g/dL   HCT 36.1 36.0 - 46.0 %   MCV 79.0 (L) 80.0 - 100.0 fL   MCH 26.5 26.0 - 34.0 pg   MCHC 33.5 30.0 - 36.0 g/dL   RDW 18.6 (H) 11.5 - 15.5 %   Platelets 243 150 - 400 K/uL   nRBC 0.3 (H) 0.0 - 0.2 %    Comment: Performed at [facility], [place] [address] [place], [place] [address]  Troponin I (High Sensitivity)     Status: None   Collection Time: 12/30/18 12:58 PM  Result Value Ref Range   Troponin I (High Sensitivity) 17 <18 ng/L    Comment: (NOTE) Elevated high sensitivity troponin I (hsTnI) values and significant  changes across serial measurements may suggest ACS but many other  chronic and acute conditions are known to elevate hsTnI results.  Refer to the "Links" section for chest pain algorithms and additional  guidance. Performed at [facility], [place] [address] [place], [place] [address]   Lipase, blood     Status: None   Collection Time: 12/30/18 12:58 PM  Result Value Ref Range   Lipase 44 11 - 51 U/L     Comment: Performed at [facility] [address] [place], [place] [address]  Urinalysis, Routine w reflex microscopic     Status: Abnormal   Collection Time: 12/30/18  3:41 PM  Result Value Ref Range   Color,  Urine YELLOW YELLOW   APPearance HAZY (A) CLEAR   Specific Gravity, Urine 1.010 1.005 - 1.030   pH 5.0 5.0 - 8.0   Glucose, UA NEGATIVE NEGATIVE mg/dL   Hgb urine dipstick NEGATIVE NEGATIVE   Bilirubin Urine NEGATIVE NEGATIVE   Ketones, ur NEGATIVE NEGATIVE mg/dL   Protein, ur 30 (A) NEGATIVE mg/dL   Nitrite NEGATIVE NEGATIVE   Leukocytes,Ua NEGATIVE NEGATIVE   RBC / HPF 0-5 0 - 5 RBC/hpf   WBC, UA 0-5 0 - 5 WBC/hpf   Bacteria, UA NONE SEEN NONE SEEN   Squamous Epithelial / LPF 0-5 0 - 5   Hyaline Casts, UA PRESENT     Comment: Performed at [facility], [place] [address] [place], [place] [address]  Troponin I (High Sensitivity)     Status: None   Collection Time: 12/30/18  3:59 PM  Result Value Ref Range   Troponin I (High Sensitivity) 16 <18 ng/L    Comment: (NOTE) Elevated high sensitivity troponin I (hsTnI) values and significant  changes across serial measurements may suggest ACS but many other  chronic and acute conditions are known to elevate hsTnI results.  Refer to the "Links" section for chest pain algorithms and additional  guidance. Performed at [facility], [place] [address], [place], [place] [address]   Hepatic function panel     Status: None   Collection Time: 12/30/18  3:59 PM  Result Value Ref Range   Total Protein 7.3 6.5 - 8.1 g/dL   Albumin 4.0 3.5 - 5.0 g/dL   AST 23 15 - 41 U/L   ALT 13 0 - 44 U/L   Alkaline Phosphatase 48 38 - 126 U/L   Total Bilirubin 0.7 0.3 - 1.2 mg/dL   Bilirubin, Direct 0.1 0.0 - 0.2 mg/dL   Indirect Bilirubin 0.6 0.3 - 0.9 mg/dL    Comment: Performed at [facility] [address] [place], [place] [address]  D-dimer, quantitative     Status: Abnormal   Collection Time: 12/30/18  6:21 PM  Result  Value Ref Range   D-Dimer, Quant 3.37 (H) 0.00 - 0.50 ug/mL-FEU    Comment: (NOTE) At the manufacturer cut-off of 0.50 ug/mL FEU, this assay has been documented to exclude PE with a sensitivity and negative predictive value of 97 to 99%.  At this time, this assay has not been approved by the FDA to exclude DVT/VTE. Results should be correlated with clinical presentation. Performed at [facility], [place] [address] [place], [place] [address]   SARS Coronavirus 2 [facility] order, Performed in [facility] hospital lab) Nasopharyngeal Nasopharyngeal Swab     Status: None   Collection Time: 12/30/18  6:56 PM   Specimen: Nasopharyngeal Swab  Result Value Ref Range   SARS Coronavirus 2 NEGATIVE NEGATIVE    Comment: (NOTE) If result is NEGATIVE SARS-CoV-2 target nucleic acids are NOT DETECTED. The SARS-CoV-2 RNA is generally detectable in upper and lower  respiratory specimens during the acute phase of infection. The lowest  concentration of SARS-CoV-2 viral copies this assay can detect is 250  copies / mL. A negative result does not preclude SARS-CoV-2 infection  and should not be used as the sole basis for treatment or other  patient management decisions.  A negative result may occur with  improper specimen collection / handling, submission of specimen other  than nasopharyngeal swab, presence of viral mutation(s) within the  areas targeted by this assay, and inadequate number  of viral copies  (<250 copies / mL). A negative result must be combined with clinical  observations, patient history, and epidemiological information. If result is POSITIVE SARS-CoV-2 target nucleic acids are DETECTED. The SARS-CoV-2 RNA is generally detectable in upper and lower  respiratory specimens dur ing the acute phase of infection.  Positive  results are indicative of active infection with SARS-CoV-2.  Clinical  correlation with patient history and other diagnostic information is  necessary to  determine patient infection status.  Positive results do  not rule out bacterial infection or co-infection with other viruses. If result is PRESUMPTIVE POSTIVE SARS-CoV-2 nucleic acids MAY BE PRESENT.   A presumptive positive result was obtained on the submitted specimen  and confirmed on repeat testing.  While 2019 novel coronavirus  (SARS-CoV-2) nucleic acids may be present in the submitted sample  additional confirmatory testing may be necessary for epidemiological  and / or clinical management purposes  to differentiate between  SARS-CoV-2 and other Sarbecovirus currently known to infect humans.  If clinically indicated additional testing with an alternate test  methodology [phone]) is advised. The SARS-CoV-2 RNA is generally  detectable in upper and lower respiratory sp ecimens during the acute  phase of infection. The expected result is Negative. Fact Sheet for Patients:  StrictlyIdeas.no Fact Sheet for Healthcare Providers: BankingDealers.co.za This test is not yet approved or cleared by the [place] FDA and has been authorized for detection and/or diagnosis of SARS-CoV-2 by FDA under an Emergency Use Authorization (EUA).  This EUA will remain in effect (meaning this test can be used) for the duration of the COVID-19 declaration under Section 564(b)(1) of the Act, 21 U.S.C. section 360bbb-3(b)(1), unless the authorization is terminated or revoked sooner. Performed at [facility], [place] [address] [place], [place] [address]   CBC     Status: Abnormal   Collection Time: 12/30/18  7:30 PM  Result Value Ref Range   WBC 10.1 4.0 - 10.5 K/uL   RBC 4.85 3.87 - 5.11 MIL/uL   Hemoglobin 12.9 12.0 - 15.0 g/dL   HCT 37.9 36.0 - 46.0 %   MCV 78.1 (L) 80.0 - 100.0 fL   MCH 26.6 26.0 - 34.0 pg   MCHC 34.0 30.0 - 36.0 g/dL   RDW 18.7 (H) 11.5 - 15.5 %   Platelets PLATELET CLUMPS NOTED ON SMEAR, UNABLE TO ESTIMATE 150 - 400 K/uL     Comment: Immature Platelet Fraction may be clinically indicated, consider ordering this additional test [id number]    nRBC 0.5 (H) 0.0 - 0.2 %    Comment: Performed at [facility], [place] [address], [place], [place] [address]  Creatinine, serum     Status: Abnormal   Collection Time: 12/30/18  7:30 PM  Result Value Ref Range   Creatinine, Ser 1.57 (H) 0.44 - 1.00 mg/dL   GFR calc non Af Amer 28 (L) >60 mL/min   GFR calc Af Amer 33 (L) >60 mL/min    Comment: Performed at [facility] [address], [place], [place] [address]  Troponin I (High Sensitivity)     Status: Abnormal   Collection Time: 12/30/18  7:30 PM  Result Value Ref Range   Troponin I (High Sensitivity) 20 (H) <18 ng/L    Comment: (NOTE) Elevated high sensitivity troponin I (hsTnI) values and significant  changes across serial measurements may suggest ACS but many other  chronic and acute conditions are known to elevate hsTnI results.  Refer to  the "Links" section for chest pain algorithms and additional  guidance. Performed at [facility], [place] [address] [place], [place] [address]   Lipid panel     Status: None   Collection Time: 12/30/18  7:30 PM  Result Value Ref Range   Cholesterol 150 0 - 200 mg/dL   Triglycerides 98 <150 mg/dL   HDL 42 >40 mg/dL   Total CHOL/HDL Ratio 3.6 RATIO   VLDL 20 0 - 40 mg/dL   LDL Cholesterol 88 0 - 99 mg/dL    Comment:        Total Cholesterol/HDL:CHD Risk Coronary Heart Disease Risk Table                     Men   Women  1/2 Average Risk   3.4   3.3  Average Risk       5.0   4.4  2 X Average Risk   9.6   7.1  3 X Average Risk  23.4   11.0        Use the calculated Patient Ratio above and the CHD Risk Table to determine the patient's CHD Risk.        ATP III CLASSIFICATION (LDL):  <100     mg/dL   Optimal  100-129  mg/dL   Near or Above                    Optimal  130-159  mg/dL   Borderline  160-189  mg/dL   High  >190     mg/dL   Very  High Performed at [facility] [address], [place], [place] [address]   Troponin I (High Sensitivity)     Status: Abnormal   Collection Time: 12/30/18  9:47 PM  Result Value Ref Range   Troponin I (High Sensitivity) 28 (H) <18 ng/L    Comment: (NOTE) Elevated high sensitivity troponin I (hsTnI) values and significant  changes across serial measurements may suggest ACS but many other  chronic and acute conditions are known to elevate hsTnI results.  Refer to the "Links" section for chest pain algorithms and additional  guidance. Performed at [facility], [place] [address] [place], [place] [address]    Dg Abdomen Acute W/chest  Result Date: 12/30/2018 CLINICAL DATA:  Chest pain and vomiting EXAM: DG ABDOMEN ACUTE W/ 1V CHEST COMPARISON:  Chest radiograph April 14, 2018; abdomen radiographs March 10, 2016 FINDINGS: PA chest: There is no edema or consolidation. Heart is slightly enlarged with pulmonary vascularity normal. No adenopathy. There is degenerative change in each shoulder. Supine and left lateral decubitus abdomen: There is moderate stool in the colon. There is no bowel dilatation or air-fluid level to suggest bowel obstruction. No evident free air. There is degenerative change in the lumbar spine. IMPRESSION: No bowel obstruction or free air evident. No lung edema or consolidation. Mild cardiac enlargement. Electronically Signed   By: Lowella Grip III M.D.   On: 12/30/2018 13:46    Pending Labs Unresulted Labs (From admission, onward)    Start     Ordered   01/06/19 0500  Creatinine, serum  (enoxaparin (LOVENOX)    CrCl >/= 30 ml/min)  Weekly,   R    Comments: while on enoxaparin therapy    12/30/18 1836          Vitals/Pain Today's Vitals   12/31/18 0830 12/31/18 0930 12/31/18 1018 12/31/18 1030  BP: (!) 151/87  Pulse:  68    Resp: 15 16  15   Temp:      TempSrc:      SpO2:  100%    PainSc:   0-No pain     Isolation Precautions No  active isolations  Medications Medications  acetaminophen (TYLENOL) tablet 650 mg (650 mg Oral Given 12/31/18 0212)  ondansetron (ZOFRAN) injection 4 mg (has no administration in time range)  enoxaparin (LOVENOX) injection 40 mg (40 mg Subcutaneous Given 12/30/18 1932)  0.45 % sodium chloride infusion ( Intravenous New Bag/Given 12/30/18 1941)  metoprolol tartrate (LOPRESSOR) tablet 25 mg (25 mg Oral Given 12/31/18 1000)  aspirin EC tablet 81 mg (81 mg Oral Given 12/31/18 1000)  triamcinolone ointment (KENALOG) 0.1 % ( Topical Given 12/31/18 1014)  escitalopram (LEXAPRO) tablet 10 mg (10 mg Oral Given 12/31/18 1041)  triamcinolone cream (KENALOG) 0.1 % 1 application (has no administration in time range)  polyvinyl alcohol (LIQUIFILM TEARS) 1.4 % ophthalmic solution 1 drop (1 drop Both Eyes Given 12/31/18 1015)  pantoprazole (PROTONIX) EC tablet 40 mg (40 mg Oral Given 12/31/18 1000)  famotidine (PEPCID) tablet 40 mg (40 mg Oral Given 12/31/18 1000)  ketoconazole (NIZORAL) 2 % cream 1 application (1 application Topical Given 12/31/18 1013)  budesonide (PULMICORT) nebulizer solution 0.25 mg (has no administration in time range)  metolazone (ZAROXOLYN) tablet 2.5 mg (2.5 mg Oral Given 12/31/18 1014)  allopurinol (ZYLOPRIM) tablet 50 mg (50 mg Oral Given 12/31/18 1013)  levETIRAcetam (KEPPRA XR) 24 hr tablet 2,000 mg (has no administration in time range)  montelukast (SINGULAIR) tablet 10 mg (has no administration in time range)  lamoTRIgine (LAMICTAL) tablet 25 mg (25 mg Oral Given 12/31/18 1014)  loratadine (CLARITIN) tablet 10 mg (10 mg Oral Given 12/31/18 1000)  multivitamin with minerals tablet 1 tablet (1 tablet Oral Given 12/31/18 1041)  neomycin-polymyxin-gramicidin (NEOSPORIN) 1.75-10000-.025 ophthalmic solution 1 drop (1 drop Right Eye Given 12/31/18 1006)  morphine 2 MG/ML injection 2 mg (2 mg Intravenous Given 12/30/18 1457)  metoprolol tartrate (LOPRESSOR) injection 5 mg (5 mg Intravenous Given  12/30/18 1820)  technetium albumin aggregated (MAA) injection solution 1.5 millicurie (1.5 millicuries Intravenous Contrast Given 12/31/18 1121)    Mobility walks with device Low fall risk   Focused Assessments Cardiac Assessment Handoff:  Cardiac Rhythm: Normal sinus rhythm Lab Results  Component Value Date   TROPONINI <0.03 11/14/2016   Lab Results  Component Value Date   DDIMER 3.37 (H) 12/30/2018   Does the Patient currently have chest pain? No     R Recommendations: See Admitting Provider Note  Report given to:   Additional Notes: Pt does have dementia.

## 2018-12-31 NOTE — ED Notes (Signed)
Ordered diet tray for pt  

## 2018-12-31 NOTE — Progress Notes (Signed)
Daughter Janani Chamber called and updated. Admission history gathered from [place] [facility] regarding patient.

## 2018-12-31 NOTE — Progress Notes (Signed)
Patient just arrive to the ED. Received report from ED that she has a history of dementia but was alert and oriented. Patient is now confused as soon as she arrived with ED staff. She is in the room yelling and raising her fist at the staff. She will not allow staff to place the telemetry box on her or obtain vital signs. MD made aware of this. MD states to let her rest for a while and try again later for the telemetry and vitals.

## 2019-01-01 DIAGNOSIS — R079 Chest pain, unspecified: Secondary | ICD-10-CM | POA: Diagnosis not present

## 2019-01-01 LAB — BASIC METABOLIC PANEL
Anion gap: 11 (ref 5–15)
BUN: 21 mg/dL (ref 8–23)
CO2: 27 mmol/L (ref 22–32)
Calcium: 9.7 mg/dL (ref 8.9–10.3)
Chloride: 98 mmol/L (ref 98–111)
Creatinine, Ser: 1.55 mg/dL — ABNORMAL HIGH (ref 0.44–1.00)
GFR calc Af Amer: 33 mL/min — ABNORMAL LOW (ref >60)
GFR calc non Af Amer: 29 mL/min — ABNORMAL LOW (ref >60)
Glucose, Bld: 111 mg/dL — ABNORMAL HIGH (ref 70–99)
Potassium: 3.6 mmol/L (ref 3.5–5.1)
Sodium: 136 mmol/L (ref 135–145)

## 2019-01-01 LAB — CBC WITH DIFFERENTIAL/PLATELET
Abs Immature Granulocytes: 0 10*3/uL (ref 0.00–0.07)
Basophils Absolute: 0.3 10*3/uL — ABNORMAL HIGH (ref 0.0–0.1)
Basophils Relative: 3 %
Eosinophils Absolute: 0.8 10*3/uL — ABNORMAL HIGH (ref 0.0–0.5)
Eosinophils Relative: 7 %
HCT: 35.6 % — ABNORMAL LOW (ref 36.0–46.0)
Hemoglobin: 11.4 g/dL — ABNORMAL LOW (ref 12.0–15.0)
Lymphocytes Relative: 18 %
Lymphs Abs: 1.9 10*3/uL (ref 0.7–4.0)
MCH: 24.9 pg — ABNORMAL LOW (ref 26.0–34.0)
MCHC: 32 g/dL (ref 30.0–36.0)
MCV: 77.7 fL — ABNORMAL LOW (ref 80.0–100.0)
Monocytes Absolute: 0.1 10*3/uL (ref 0.1–1.0)
Monocytes Relative: 1 %
Neutro Abs: 7.7 10*3/uL (ref 1.7–7.7)
Neutrophils Relative %: 71 %
Platelets: 229 10*3/uL (ref 150–400)
RBC: 4.58 MIL/uL (ref 3.87–5.11)
RDW: 18.4 % — ABNORMAL HIGH (ref 11.5–15.5)
WBC: 10.8 10*3/uL — ABNORMAL HIGH (ref 4.0–10.5)
nRBC: 0.4 % — ABNORMAL HIGH (ref 0.0–0.2)
nRBC: 1 /100 WBC — ABNORMAL HIGH

## 2019-01-01 LAB — TROPONIN I (HIGH SENSITIVITY): Troponin I (High Sensitivity): 27 ng/L — ABNORMAL HIGH (ref <18)

## 2019-01-01 MED ORDER — METOPROLOL TARTRATE 25 MG PO TABS: 12.5000 mg | ORAL_TABLET | Freq: Two times a day (BID) | ORAL | 0 refills | Status: DC

## 2019-01-01 NOTE — Discharge Instructions (Signed)
Follow with Primary MD [name], [name] in 7 days   Get CBC, CMP,  checked  by Primary MD next visit.    Activity: As tolerated with Full fall precautions use walker/cane & assistance as needed   Disposition Home   Diet: Heart Healthy  , with feeding assistance and aspiration precautions.  For Heart failure patients - Check your Weight same time everyday, if you gain over 2 pounds, or you develop in leg swelling, experience more shortness of breath or chest pain, call your Primary MD immediately. Follow Cardiac Low Salt Diet and 1.5 lit/day fluid restriction.   On your next visit with your primary care physician please Get Medicines reviewed and adjusted.   Please request your Prim.MD to go over all Hospital Tests and Procedure/Radiological results at the follow up, please get all Hospital records sent to your Prim MD by signing hospital release before you go home.   If you experience worsening of your admission symptoms, develop shortness of breath, life threatening emergency, suicidal or homicidal thoughts you must seek medical attention immediately by calling 911 or calling your MD immediately  if symptoms less severe.  You Must read complete instructions/literature along with all the possible adverse reactions/side effects for all the Medicines you take and that have been prescribed to you. Take any new Medicines after you have completely understood and accpet all the possible adverse reactions/side effects.   Do not drive, operating heavy machinery, perform activities at heights, swimming or participation in water activities or provide baby sitting services if your were admitted for syncope or siezures until you have seen by Primary MD or a Neurologist and advised to do so again.  Do not drive when taking Pain medications.    Do not take more than prescribed Pain, Sleep and Anxiety Medications  Special Instructions: If you have smoked or chewed Tobacco  in the last 2 yrs  please stop smoking, stop any regular Alcohol  and or any Recreational drug use.  Wear Seat belts while driving.   Please note  You were cared for by a hospitalist during your hospital stay. If you have any questions about your discharge medications or the care you received while you were in the hospital after you are discharged, you can call the unit and asked to speak with the hospitalist on call if the hospitalist that took care of you is not available. Once you are discharged, your primary care physician will handle any further medical issues. Please note that NO REFILLS for any discharge medications will be authorized once you are discharged, as it is imperative that you return to your primary care physician (or establish a relationship with a primary care physician if you do not have one) for your aftercare needs so that they can reassess your need for medications and monitor your lab values.

## 2019-01-01 NOTE — Evaluation (Signed)
Physical Therapy Evaluation Patient Details Name: [name] MRN: [id number] DOB: 1923-07-28 Today's Date: 01/01/2019   History of Present Illness  85 y.o. female admitted with chest pain with PE ruled out. PMHx: HTn,DDD,  obesity, gout, seizure disorder, history of CVA  Clinical Impression  Pt pleasantly confused on arrival. Pt able to state she lives alone with periodic assist of family but no 24hr support. Pt with decreased problem solving, orientation, awareness and function who will benefit from acute therapy to maximize mobility, function, safety and decrease fall risk for decreased burden of care. Pt would benefit from ALF long term vs SNF. Will follow acutely and recommend daily ambulation with nursing.   HR 104 with gait without report of pain    Follow Up Recommendations Home health PT;Supervision/Assistance - 24 hour;SNF(SNF if family not available for 24hr assist. Long term ALF recommended if no 24hr)    Equipment Recommendations  None recommended by PT    Recommendations for Other Services OT consult     Precautions / Restrictions Precautions Precautions: Fall      Mobility  Bed Mobility Overal bed mobility: Needs Assistance Bed Mobility: Supine to Sit     Supine to sit: HOB elevated     General bed mobility comments: HOb 20 degrees with minguard to pivot to EOB with use of rail, increased time and cues  Transfers Overall transfer level: Needs assistance   Transfers: Sit to/from Stand Sit to Stand: Min guard         General transfer comment: cues for hand placement and safety  Ambulation/Gait Ambulation/Gait assistance: Min guard Gait Distance (Feet): 350 Feet Assistive device: Rolling walker (2 wheeled) Gait Pattern/deviations: Step-through pattern;Decreased stride length;Trunk flexed   Gait velocity interpretation: 1.31 - 2.62 ft/sec, indicative of limited community ambulator General Gait Details: cues for posture, proximity to RW and safety  with cues for direction as pt unaware of her room  Stairs            Wheelchair Mobility    Modified Rankin (Stroke Patients Only)       Balance Overall balance assessment: History of Falls;Needs assistance   Sitting balance-Leahy Scale: Fair     Standing balance support: Bilateral upper extremity supported Standing balance-Leahy Scale: Poor                               Pertinent Vitals/Pain Pain Assessment: No/denies pain    Home Living Family/patient expects to be discharged to:: Private residence Living Arrangements: Alone Available Help at Discharge: Family;Available PRN/intermittently Type of Home: House       Home Layout: One level Home Equipment: Bedside commode;Walker - 2 wheels;Walker - 4 wheels;Cane - single point;Shower seat      Prior Function Level of Independence: Independent with assistive device(s)         Comments: pt reports she uses a cane, at least 4 falls recently and reports daughter comes over to assist with cleaning     Hand Dominance        Extremity/Trunk Assessment   Upper Extremity Assessment Upper Extremity Assessment: Generalized weakness    Lower Extremity Assessment Lower Extremity Assessment: Generalized weakness    Cervical / Trunk Assessment Cervical / Trunk Assessment: Kyphotic  Communication   Communication: No difficulties  Cognition Arousal/Alertness: Awake/alert Behavior During Therapy: WFL for tasks assessed/performed Overall Cognitive Status: Impaired/Different from baseline Area of Impairment: Memory;Orientation;Problem solving;Safety/judgement  Orientation Level: Disoriented to;Time;Place;Situation   Memory: Decreased short-term memory   Safety/Judgement: Decreased awareness of safety;Decreased awareness of deficits   Problem Solving: Slow processing General Comments: pt cued to be aware of fatigue for return to room but could not recall, pt unaware she is in  [place] Comments      Exercises     Assessment/Plan    PT Assessment Patient needs continued PT services  PT Problem List Decreased strength;Decreased mobility;Decreased safety awareness;Decreased activity tolerance;Decreased balance;Decreased knowledge of use of DME       PT Treatment Interventions Gait training;Therapeutic exercise;Patient/family education;DME instruction;Therapeutic activities;Cognitive remediation;Stair training;Balance training;Functional mobility training    PT Goals (Current goals can be found in the Care Plan section)  Acute Rehab PT Goals Patient Stated Goal: return home PT Goal Formulation: With patient Time For Goal Achievement: 01/15/19 Potential to Achieve Goals: Fair    Frequency Min 3X/week   Barriers to discharge Decreased caregiver support      Co-evaluation               AM-PAC PT "6 Clicks" Mobility  Outcome Measure Help needed turning from your back to your side while in a flat bed without using bedrails?: A Little Help needed moving from lying on your back to sitting on the side of a flat bed without using bedrails?: A Little Help needed moving to and from a bed to a chair (including a wheelchair)?: A Little Help needed standing up from a chair using your arms (e.g., wheelchair or bedside chair)?: A Little Help needed to walk in hospital room?: A Little Help needed climbing 3-5 steps with a railing? : A Lot 6 Click Score: 17    End of Session Equipment Utilized During Treatment: Gait belt Activity Tolerance: Patient tolerated treatment well Patient left: in chair;with call bell/phone within reach;with chair alarm set Nurse Communication: Mobility status PT Visit Diagnosis: Other abnormalities of gait and mobility (R26.89);Muscle weakness (generalized) (M62.81);History of falling (Z91.81)    Time: 0051-1021 PT Time Calculation (min) (ACUTE ONLY): 18 min   Charges:   PT Evaluation $PT Eval Moderate Complexity:  1 Mod          [place], PT Acute Rehabilitation Services Pager: [phone] Office: [phone]   Tayte Childers B Wyoma Genson 01/01/2019, 10:31 AM

## 2019-01-01 NOTE — Discharge Summary (Signed)
[name], is a 85 y.o. female  DOB 1923-06-11  MRN [id number].  Admission date:  12/30/2018  Admitting Physician  Elwyn Reach, MD  Discharge Date:  01/01/2019   Primary MD  [name], [name]  Recommendations for primary care physician for things to follow:  -Please check CBC, BMP during next visit   Admission Diagnosis  cp   Discharge Diagnosis  cp    Principal Problem:   Chest pain Active Problems:   Essential hypertension, benign   Anemia associated with chronic renal failure   Chronic kidney disease (CKD), stage IV (severe) (HCC)   Morbid obesity ([place])   History of stroke   Atypical chest pain      Past Medical History:  Diagnosis Date  . Chronic low back pain   . Degenerative arthritis   . Eczema   . Edema   . Gout   . Headache   . Hiatal hernia   . Hypertension   . Internal hemorrhoids   . Neurogenic claudication   . Obesity   . Seizure disorder ([place])   . Seizures ([place])   . Stroke ([place])   . Urticaria     Past Surgical History:  Procedure Laterality Date  . ABDOMINAL HYSTERECTOMY    . APPENDECTOMY    . APPENDECTOMY    . bilateral knees    . cataracts    . CHOLECYSTECTOMY    . mammoplasty reduction    . NEUROPLASTY / TRANSPOSITION MEDIAN NERVE AT CARPAL TUNNEL BILATERAL    . RE-EXCISION OF BREAST LUMPECTOMY    . TUBAL LIGATION         History of present illness and  Hospital Course:     Kindly see H&P for history of present illness and admission details, please review complete Labs, Consult reports and Test reports for all details in brief  HPI  from the history and physical done on the day of admission 12/30/2018  HPI: [name] is a 85 y.o. female with medical history significant of hypertension, obesity, degenerative disc disease, gout, seizure disorder, history of CVA who presents to the ER with chest pain.  She apparently went to eat a  sandwich at [employer] this morning started having chest discomfort.  She was also seen by the eye doctors after her breakfast for eye exam.  During that.  Her eyes were dilated with chemicals.  She subsequently started having chest tightness and then vomited.  Patient's pain was rated as 5 out of 10.  Radiated to her left arm.  Associated with the diaphoresis as well as the nausea and vomiting.  She appears to have risk factors for coronary artery disease.  She responded to nitroglycerin here.  Symptoms have largely disappeared at this point.  Patient will be admitted for rule out MI..  ED Course: Temperature is 97.6 blood pressure was 75/87 pulse 132 respiratory of 26 oxygen sat 93% room air.  Initial troponin was 20 then 28.  Creatinine 1.57.  Lipid panel appears to  be within normal CBC all within normal COVID-19 is negative d-dimer 3.37.  EKG showed sinus tachycardia.  No significant change  Hospital Course   Chest pain -Has nontypical features, patient was admitted for further work-up, she was monitored on telemetry, with no acute events, cycled her hs troponins, non-ACS pattern, 2D echo was obtained, with a preserved EF, no regional wall motion abnormalities, no further work-up indicated at this point,. -V/Q scan with no evidence of PE  Elevated d-dimer -VQ scan with no evidence of PE  Hypertension -Initially uncontrolled, she was started on low-dose metoprolol with good control  CKD stage IV -At baseline  Anemia of chronic kidney disease Hemoglobin at baseline   Seizure disorder Continue home Keppra  GERD Continue Pepcid, pantoprazole  Morbid obesity  History of frequent falls PT consulted, home care arranged  I have discussed with the daughter, patient appears to be having some baseline dementia, as she is noted to have some mild confusion, which appears to be her baseline, even the patient lives by herself, family check on her frequently, as well now she will go to  stay with her daughter for a few days.    Discharge Condition: stable   Follow UP  Follow-up Information    [name], [name]. Go on 01/07/2019.   Specialty: Family Medicine Why: @10 :10am Contact information: [place] [address] [phone]             Discharge Instructions  and  Discharge Medications     Discharge Instructions    Discharge instructions   Complete by: As directed    Follow with Primary MD [name], [name] in 7 days   Get CBC, CMP,  checked  by Primary MD next visit.    Activity: As tolerated with Full fall precautions use walker/cane & assistance as needed   Disposition Home   Diet: Heart Healthy  , with feeding assistance and aspiration precautions.  For Heart failure patients - Check your Weight same time everyday, if you gain over 2 pounds, or you develop in leg swelling, experience more shortness of breath or chest pain, call your Primary MD immediately. Follow Cardiac Low Salt Diet and 1.5 lit/day fluid restriction.   On your next visit with your primary care physician please Get Medicines reviewed and adjusted.   Please request your Prim.MD to go over all Hospital Tests and Procedure/Radiological results at the follow up, please get all Hospital records sent to your Prim MD by signing hospital release before you go home.   If you experience worsening of your admission symptoms, develop shortness of breath, life threatening emergency, suicidal or homicidal thoughts you must seek medical attention immediately by calling 911 or calling your MD immediately  if symptoms less severe.  You Must read complete instructions/literature along with all the possible adverse reactions/side effects for all the Medicines you take and that have been prescribed to you. Take any new Medicines after you have completely understood and accpet all the possible adverse reactions/side effects.   Do not drive, operating heavy  machinery, perform activities at heights, swimming or participation in water activities or provide baby sitting services if your were admitted for syncope or siezures until you have seen by Primary MD or a Neurologist and advised to do so again.  Do not drive when taking Pain medications.    Do not take more than prescribed Pain, Sleep and Anxiety Medications  Special Instructions: If you have smoked or chewed Tobacco  in the last 2 yrs please stop smoking, stop any regular Alcohol  and or any Recreational drug use.  Wear Seat belts while driving.   Please note  You were cared for by a hospitalist during your hospital stay. If you have any questions about your discharge medications or the care you received while you were in the hospital after you are discharged, you can call the unit and asked to speak with the hospitalist on call if the hospitalist that took care of you is not available. Once you are discharged, your primary care physician will handle any further medical issues. Please note that NO REFILLS for any discharge medications will be authorized once you are discharged, as it is imperative that you return to your primary care physician (or establish a relationship with a primary care physician if you do not have one) for your aftercare needs so that they can reassess your need for medications and monitor your lab values.   Increase activity slowly   Complete by: As directed      Allergies as of 01/01/2019      Reactions   Advicor [niacin-lovastatin Er] Other (See Comments)   Reaction unknown to daughter   Caduet [amlodipine-atorvastatin] Other (See Comments)   Reaction unknown to daughter   Codeine Other (See Comments)   Reaction unknown to daughter   Dilantin [phenytoin] Other (See Comments)   Reaction unknown to daughter   Lescol [fluvastatin] Other (See Comments)   Reaction unknown to daughter   Pamelor [nortriptyline] Other (See Comments)   Reaction unknown to daughter    Penicillins Hives   Did it involve swelling of the face/tongue/throat, SOB, or low BP? Unk Did it involve sudden or severe rash/hives, skin peeling, or any reaction on the inside of your mouth or nose? Unk Did you need to seek medical attention at a hospital or doctor's office? Unk When did it last happen? "it was a long time ago" If all above answers are "NO", may proceed with cephalosporin use.   Sulfa Antibiotics Hives   Trileptal [oxcarbazepine] Other (See Comments)   Reaction unknown to daughter   Ultram [tramadol] Other (See Comments)   Reaction unknown to daughter   Welchol [colesevelam] Other (See Comments)   Reaction unknown to daughter   Latex Rash   Tomato Rash      Medication List    TAKE these medications   acetaminophen 500 MG tablet Commonly known as: TYLENOL Take 500-1,000 mg by mouth every 8 (eight) hours as needed for mild pain or headache.   allopurinol 100 MG tablet Commonly known as: ZYLOPRIM TAKE 1/2 TABLET BY MOUTH EVERY DAY   Arnuity Ellipta 200 MCG/ACT Aepb Generic drug: Fluticasone Furoate INHALE 1 PUFF BY MOUTH EVERY DAY What changed: See the new instructions.   aspirin 81 MG tablet Take 81 mg by mouth daily.   cetirizine 10 MG tablet Commonly known as: ZYRTEC TAKE 1 TABLET BY MOUTH EVERY DAY   escitalopram 5 MG tablet Commonly known as: LEXAPRO Take 1 tablet (5 mg total) by mouth daily for 14 days, THEN 2 tablets (10 mg total) daily for 14 days. Start taking on: March 17, 2018   famotidine 40 MG tablet Commonly known as: PEPCID TAKE 1 TABLET BY MOUTH EVERY DAY   ketoconazole 2 % cream Commonly known as: NIZORAL Apply to the affected areas underneath breasts, belly and in groin region ONCE daily x2-4 weeks. What changed:   how much to take  how to take this  when to take this  additional instructions   lamoTRIgine 25 MG tablet Commonly known as: LAMICTAL Take 2 tablets (50 mg total) by mouth 2 (two) times daily. What  changed: how much to take   levETIRAcetam 500 MG 24 hr tablet Commonly known as: KEPPRA XR Take 4 tablets (2,000 mg total) by mouth at bedtime.   metolazone 2.5 MG tablet Commonly known as: ZAROXOLYN TAKE 1 TABLET BY MOUTH ON MONDAYS, WEDNESDAYS, AND FRIDAYS What changed: See the new instructions.   metoprolol tartrate 25 MG tablet Commonly known as: LOPRESSOR Take 0.5 tablets (12.5 mg total) by mouth 2 (two) times daily.   mometasone 0.1 % ointment Commonly known as: ELOCON Apply to skin once daily as directed after bathing.   montelukast 10 MG tablet Commonly known as: SINGULAIR TAKE ONE TABLET BY MOUTH DAILY AT BEDTIME What changed:   how much to take  how to take this  when to take this  additional instructions   nystatin-triamcinolone ointment Commonly known as: MYCOLOG Apply 1 application topically 2 (two) times daily.   omeprazole 40 MG capsule Commonly known as: PriLOSEC Take 1 capsule (40 mg total) by mouth daily. What changed:   when to take this  reasons to take this   One-A-Day Womens 50 Plus Tabs Take 1 tablet by mouth daily with breakfast.   SYSTANE BALANCE OP Place 1 drop into both eyes 2 (two) times daily.   triamcinolone cream 0.1 % Commonly known as: KENALOG Apply 1 application topically 2 (two) times daily. What changed:   when to take this  reasons to take this   trimethoprim-polymyxin b ophthalmic solution Commonly known as: Polytrim Place 1 drop into the right eye every 6 (six) hours.         Diet and Activity recommendation: See Discharge Instructions above   Consults obtained - none   Major procedures and Radiology Reports - PLEASE review detailed and final reports for all details, in brief -      Nm Pulmonary Perfusion  Result Date: 12/31/2018 CLINICAL DATA:  Positive D-dimer, chest pain EXAM: NUCLEAR MEDICINE PERFUSION LUNG SCAN TECHNIQUE: Perfusion images were obtained in multiple projections after  intravenous injection of radiopharmaceutical. Ventilation scans intentionally deferred if perfusion scan and chest x-ray adequate for interpretation during COVID 19 epidemic. RADIOPHARMACEUTICALS:  1.5 mCi Tc-61m MAA IV COMPARISON:  Chest x-ray 12/30/2018 FINDINGS: Homogeneous perfusion to the bilateral lungs. No significant wedge-shaped defects IMPRESSION: Negative examination. Note that delay in interpretation was due to technical problem with generating accession/order. Electronically Signed   By: Donavan Foil M.D.   On: 12/31/2018 17:51   Dg Abdomen Acute W/chest  Result Date: 12/30/2018 CLINICAL DATA:  Chest pain and vomiting EXAM: DG ABDOMEN ACUTE W/ 1V CHEST COMPARISON:  Chest radiograph April 14, 2018; abdomen radiographs March 10, 2016 FINDINGS: PA chest: There is no edema or consolidation. Heart is slightly enlarged with pulmonary vascularity normal. No adenopathy. There is degenerative change in each shoulder. Supine and left lateral decubitus abdomen: There is moderate stool in the colon. There is no bowel dilatation or air-fluid level to suggest bowel obstruction. No evident free air. There is degenerative change in the lumbar spine. IMPRESSION: No bowel obstruction or free air evident. No lung edema or consolidation. Mild cardiac enlargement. Electronically Signed   By: Lowella Grip III M.D.   On: 12/30/2018 13:46    Micro Results    Recent Results (from the past 240 hour(s))  SARS Coronavirus 2 [facility] order, Performed in  [facility] hospital lab) Nasopharyngeal Nasopharyngeal Swab     Status: None   Collection Time: 12/30/18  6:56 PM   Specimen: Nasopharyngeal Swab  Result Value Ref Range Status   SARS Coronavirus 2 NEGATIVE NEGATIVE Final    Comment: (NOTE) If result is NEGATIVE SARS-CoV-2 target nucleic acids are NOT DETECTED. The SARS-CoV-2 RNA is generally detectable in upper and lower  respiratory specimens during the acute phase of infection. The lowest   concentration of SARS-CoV-2 viral copies this assay can detect is 250  copies / mL. A negative result does not preclude SARS-CoV-2 infection  and should not be used as the sole basis for treatment or other  patient management decisions.  A negative result may occur with  improper specimen collection / handling, submission of specimen other  than nasopharyngeal swab, presence of viral mutation(s) within the  areas targeted by this assay, and inadequate number of viral copies  (<250 copies / mL). A negative result must be combined with clinical  observations, patient history, and epidemiological information. If result is POSITIVE SARS-CoV-2 target nucleic acids are DETECTED. The SARS-CoV-2 RNA is generally detectable in upper and lower  respiratory specimens dur ing the acute phase of infection.  Positive  results are indicative of active infection with SARS-CoV-2.  Clinical  correlation with patient history and other diagnostic information is  necessary to determine patient infection status.  Positive results do  not rule out bacterial infection or co-infection with other viruses. If result is PRESUMPTIVE POSTIVE SARS-CoV-2 nucleic acids MAY BE PRESENT.   A presumptive positive result was obtained on the submitted specimen  and confirmed on repeat testing.  While 2019 novel coronavirus  (SARS-CoV-2) nucleic acids may be present in the submitted sample  additional confirmatory testing may be necessary for epidemiological  and / or clinical management purposes  to differentiate between  SARS-CoV-2 and other Sarbecovirus currently known to infect humans.  If clinically indicated additional testing with an alternate test  methodology [phone]) is advised. The SARS-CoV-2 RNA is generally  detectable in upper and lower respiratory sp ecimens during the acute  phase of infection. The expected result is Negative. Fact Sheet for Patients:  StrictlyIdeas.no Fact Sheet  for Healthcare Providers: BankingDealers.co.za This test is not yet approved or cleared by the [place] FDA and has been authorized for detection and/or diagnosis of SARS-CoV-2 by FDA under an Emergency Use Authorization (EUA).  This EUA will remain in effect (meaning this test can be used) for the duration of the COVID-19 declaration under Section 564(b)(1) of the Act, 21 U.S.C. section 360bbb-3(b)(1), unless the authorization is terminated or revoked sooner. Performed at [facility], [place] [address] [place], [place] [address]        Today   Subjective:   [name] today denies any chest pain, shortness of breath, denies any weakness, asking when she can go home.    Objective:   Blood pressure 140/86, pulse (!) 104, temperature 97.7 F (36.5 C), temperature source Oral, resp. rate 18, height 5\' 4"  (1.626 m), weight 102.6 kg, SpO2 96 %.   Intake/Output Summary (Last 24 hours) at 01/01/2019 1334 Last data filed at 01/01/2019 1040 Gross per 24 hour  Intake 1406.61 ml  Output 1500 ml  Net -93.39 ml    Exam Awake Alert, mildly confused, pleasant  Symmetrical Chest wall movement, Good air movement bilaterally, CTAB RRR,No Gallops,Rubs or new Murmurs, No Parasternal Heave +ve B.Sounds, Abd Soft, Non tender,No rebound -guarding or rigidity.  No Cyanosis, Clubbing or edema, No new Rash or bruise  Data Review   CBC w Diff:  Lab Results  Component Value Date   WBC 10.8 (H) 01/01/2019   HGB 11.4 (L) 01/01/2019   HGB 11.0 (L) 03/17/2018   HCT 35.6 (L) 01/01/2019   HCT 33.6 (L) 03/17/2018   PLT 229 01/01/2019   PLT 162 03/17/2018   LYMPHOPCT 18 01/01/2019   MONOPCT 1 01/01/2019   EOSPCT 7 01/01/2019   BASOPCT 3 01/01/2019    CMP:  Lab Results  Component Value Date   NA 136 01/01/2019   NA 142 03/17/2018   K 3.6 01/01/2019   CL 98 01/01/2019   CO2 27 01/01/2019   BUN 21 01/01/2019   BUN 25 03/17/2018   CREATININE 1.55 (H)  01/01/2019   PROT 7.3 12/30/2018   PROT 7.0 03/17/2018   ALBUMIN 4.0 12/30/2018   ALBUMIN 4.2 03/17/2018   BILITOT 0.7 12/30/2018   BILITOT 0.4 03/17/2018   ALKPHOS 48 12/30/2018   AST 23 12/30/2018   ALT 13 12/30/2018  .   Total Time in preparing paper work, data evaluation and todays exam - 19 minutes  Phillips Climes M.D on 01/01/2019 at 1:34 PM  [facility]   Office  [phone]

## 2019-01-01 NOTE — Care Management Obs Status (Signed)
[facility] NOTIFICATION   Patient Details  Name: KATORIA YETMAN MRN: [id number] Date of Birth: [birth year]   Medicare Observation Status Notification Given:       Alberteen Sam, LCSW 01/01/2019, 12:54 PM

## 2019-01-01 NOTE — Care Management CC44 (Signed)
Condition Code 44 Documentation Completed  Patient Details  Name: [name] MRN: [id number] Date of Birth: [birth year]   Condition Code 44 given:    Patient signature on Condition Code 44 notice:    Documentation of 2 MD's agreement:    Code 44 added to claim:       Alberteen Sam, LCSW 01/01/2019, 12:54 PM

## 2019-01-01 NOTE — TOC Initial Note (Signed)
Transition of Care [facility]) - Initial/Assessment Note    Patient Details  Name: [name] MRN: [id number] Date of Birth: [birth year]  Transition of Care [facility]) CM/SW Contact:    Alberteen Sam, LCSW Phone Number: 01/01/2019, 1:34 PM  Clinical Narrative:                  CSW spoke with patient's daughter [name] regarding discharge plan home today. [name] reports patient's family is very supportive and provide her with assistance. [name] states the plan is for patient to go home with her and stay a few days before returning to her own home. She reports patient had home health services in the past but does not remember the agency, [name] is agreeable to home health.   [facility] has accepted for home health PT services. [name] informed.   Expected Discharge Plan: [place] Barriers to Discharge: No Barriers Identified   Patient Goals and CMS Choice Patient states their goals for this hospitalization and ongoing recovery are:: to go home CMS Medicare.gov Compare Post Acute Care list provided to:: Patient Represenative (must comment)(daughter [name]) Choice offered to / list presented to : Adult Children(Cynthia)  Expected Discharge Plan and Services Expected Discharge Plan: [place] Choice: [place] arrangements for the past 2 months: Single Family Home Expected Discharge Date: 01/01/19                         HH Arranged: PT [place]: [facility] [employer] Date [place]: 01/01/19 Time [place]: [place] Representative spoke with at [place]: [place]  Prior Living Arrangements/Services Living arrangements for the past 2 months: [place] with:: Self Patient language and need for interpreter reviewed:: Yes Do you feel safe going back to the place where you live?: Yes      Need for Family Participation in Patient Care: Yes (Comment) Care giver  support system in place?: Yes (comment)   Criminal Activity/Legal Involvement Pertinent to Current Situation/Hospitalization: No - Comment as needed  Activities of Daily Living Home Assistive Devices/Equipment: Walker (specify type), Cane (specify quad or straight), Eyeglasses, Dentures (specify type) ADL Screening (condition at time of admission) Patient's cognitive ability adequate to safely complete daily activities?: Yes Is the patient deaf or have difficulty hearing?: No Does the patient have difficulty seeing, even when wearing glasses/contacts?: No Does the patient have difficulty concentrating, remembering, or making decisions?: No Patient able to express need for assistance with ADLs?: Yes Does the patient have difficulty dressing or bathing?: No Independently performs ADLs?: Yes (appropriate for developmental age) Does the patient have difficulty walking or climbing stairs?: No Weakness of Legs: None Weakness of Arms/Hands: None  Permission Sought/Granted Permission sought to share information with : Case Manager, Customer service manager, Family Supports Permission granted to share information with : Yes, Verbal Permission Granted  Share Information with NAME: [name]  Permission granted to share info w AGENCY: [place] granted to share info w Relationship: daughter  Permission granted to share info w Contact Information: [phone]  Emotional Assessment Appearance:: Appears stated age Attitude/Demeanor/Rapport: Gracious Affect (typically observed): Calm Orientation: : Oriented to Self, Oriented to Place, Oriented to  Time, Oriented to Situation Alcohol / Substance Use: Not Applicable Psych Involvement: No (comment)  Admission diagnosis:  cp Patient Active Problem List   Diagnosis Date Noted  . Atypical  chest pain 12/31/2018  . Asthma 02/12/2017  . Chest pain 11/13/2016  . History of seizures 11/13/2016  . History of stroke 11/13/2016  .  Allergic urticaria 08/30/2016  . GAD (generalized anxiety disorder) 08/30/2016  . Gout 08/30/2016  . Acute renal failure superimposed on stage 3 chronic kidney disease ([place]) 10/11/2015  . Neurogenic claudication   . Morbid obesity ([place])   . Edema   . Essential hypertension, benign 09/30/2014  . Secondary hyperparathyroidism, renal ([place]) 09/30/2014  . Proteinuria 09/30/2014  . Anemia associated with chronic renal failure 09/30/2014  . Chronic kidney disease (CKD), stage IV (severe) ([place]) 09/30/2014  . Hiatal hernia 08/21/2012   PCP:  [name], [name] Pharmacy:   [id number] - [place], [address] [address] Phone: [phone] Fax: [phone]     Social Determinants of Health (SDOH) Interventions    Readmission Risk Interventions No flowsheet data found.

## 2019-01-01 NOTE — Evaluation (Signed)
Occupational Therapy Evaluation Patient Details Name: [name] MRN: [id number] DOB: 1923/10/09 Today's Date: 01/01/2019    History of Present Illness 85 y.o. female admitted with chest pain with PE ruled out. PMHx: HTn,DDD,  obesity, gout, seizure disorder, history of CVA   Clinical Impression   PTA Pt ambulating mod I with DME, min A for ADL and mod A for IADL. Pt typically lives alone and has good famiy support. Pt presents pleasantly confused, finished lunch. Pt able to transfer min guard A with RW for toilet transfer, mod A for peri care, min guard assist for standing grooming at sink. Pt will benefit from skilled OT services acutely and afterwards at [facility] level to maximize safety and independence in ADL and functional transfers to return to PLOF.     Follow Up Recommendations  Home health OT;Supervision/Assistance - 24 hour(if familiy not able to provide 24-7, will need SNF)    Equipment Recommendations  None recommended by OT(Pt has appropriate DME)    Recommendations for Other Services       Precautions / Restrictions Precautions Precautions: Fall Restrictions Weight Bearing Restrictions: No      Mobility Bed Mobility Overal bed mobility: Needs Assistance Bed Mobility: Sit to Supine       Sit to supine: Min assist   General bed mobility comments: min A to assist with BLE  Transfers Overall transfer level: Needs assistance Equipment used: Rolling walker (2 wheeled) Transfers: Sit to/from Stand Sit to Stand: Min guard;Min assist         General transfer comment: cues for hand placement and safety, assist for steadying balance    Balance Overall balance assessment: History of Falls;Needs assistance   Sitting balance-Leahy Scale: Fair     Standing balance support: Bilateral upper extremity supported Standing balance-Leahy Scale: Poor Standing balance comment: dependent on RW                            ADL either performed or assessed  with clinical judgement   ADL Overall ADL's : Needs assistance/impaired Eating/Feeding: Set up;Sitting   Grooming: Minimal assistance;Standing;Wash/dry hands Grooming Details (indicate cue type and reason): leans against sink for balance, cues for soap Upper Body Bathing: Moderate assistance   Lower Body Bathing: Moderate assistance   Upper Body Dressing : Minimal assistance   Lower Body Dressing: Maximal assistance   Toilet Transfer: Min guard;Minimal assistance;Ambulation;RW   Toileting- Clothing Manipulation and Hygiene: Moderate assistance;Sit to/from stand Toileting - Clothing Manipulation Details (indicate cue type and reason): assist for thoroughness     Functional mobility during ADLs: Min guard;Minimal assistance;Rolling walker;Cueing for safety General ADL Comments: decreased activity tolerance, cognition     Vision         Perception     Praxis      Pertinent Vitals/Pain Pain Assessment: No/denies pain     Hand Dominance Right   Extremity/Trunk Assessment Upper Extremity Assessment Upper Extremity Assessment: Generalized weakness   Lower Extremity Assessment Lower Extremity Assessment: Generalized weakness   Cervical / Trunk Assessment Cervical / Trunk Assessment: Kyphotic   Communication Communication Communication: No difficulties   Cognition Arousal/Alertness: Awake/alert Behavior During Therapy: WFL for tasks assessed/performed Overall Cognitive Status: Impaired/Different from baseline Area of Impairment: Memory;Orientation;Problem solving;Safety/judgement                 Orientation Level: Disoriented to;Time;Place;Situation   Memory: Decreased short-term memory   Safety/Judgement: Decreased awareness of safety;Decreased awareness of deficits  Problem Solving: Slow processing;Decreased initiation;Difficulty sequencing;Requires verbal cues General Comments: pleasant, cooperative, but required cues for safety, cues for initiation  and sequencing   General Comments       Exercises     Shoulder Instructions      Home Living Family/patient expects to be discharged to:: Private residence Living Arrangements: Alone Available Help at Discharge: Family;Available PRN/intermittently Type of Home: House       Home Layout: One level     Bathroom Shower/Tub: Teacher, early years/pre: Handicapped height Bathroom Accessibility: Yes How Accessible: Accessible via walker Home Equipment: Bedside commode;Walker - 2 wheels;Walker - 4 wheels;Cane - single point;Shower seat          Prior Functioning/Environment Level of Independence: Independent with assistive device(s)        Comments: pt reports she uses a cane, at least 4 falls recently and reports daughter comes over to assist with cleaning        OT Problem List: Decreased activity tolerance;Impaired balance (sitting and/or standing);Decreased cognition;Decreased safety awareness;Decreased knowledge of use of DME or AE      OT Treatment/Interventions: Self-care/ADL training    OT Goals(Current goals can be found in the care plan section) Acute Rehab OT Goals Patient Stated Goal: return home OT Goal Formulation: With patient Time For Goal Achievement: 01/15/19 Potential to Achieve Goals: Good  OT Frequency: Min 2X/week   Barriers to D/C: Decreased caregiver support          Co-evaluation              AM-PAC OT "6 Clicks" Daily Activity     Outcome Measure Help from another person eating meals?: A Little Help from another person taking care of personal grooming?: A Little Help from another person toileting, which includes using toliet, bedpan, or urinal?: A Little Help from another person bathing (including washing, rinsing, drying)?: A Lot Help from another person to put on and taking off regular upper body clothing?: A Little Help from another person to put on and taking off regular lower body clothing?: A Lot 6 Click Score:  16   End of Session Equipment Utilized During Treatment: Gait belt;Rolling walker Nurse Communication: Mobility status  Activity Tolerance: Patient tolerated treatment well Patient left: in bed;with call bell/phone within reach;with bed alarm set  OT Visit Diagnosis: Unsteadiness on feet (R26.81);Other abnormalities of gait and mobility (R26.89);Repeated falls (R29.6);History of falling (Z91.81);Muscle weakness (generalized) (M62.81);Other symptoms and signs involving cognitive function;Adult, failure to thrive (R62.7)                Time: 5681-2751 OT Time Calculation (min): 21 min Charges:  OT General Charges $OT Visit: 1 Visit OT Evaluation $OT Eval Moderate Complexity: [place] OTR/L Acute Rehabilitation Services Pager: [phone] Office: [place] 01/01/2019, 2:45 PM

## 2019-01-01 NOTE — Progress Notes (Signed)
Patient provided discharge instructions. Dressed and taken out via wheelchair by nursing staff. Nurse review discharge paperwork with daughter at the the vehicle. Daughter verbalized understanding of discharge instructions.

## 2019-01-01 NOTE — Progress Notes (Signed)
Called [name] Dtr in regards to DC. She is working with her daughter on school then will be in an hour or so. I informed her she will be in the [employer]

## 2019-01-06 ENCOUNTER — Telehealth: Payer: Self-pay | Admitting: Family

## 2019-01-06 NOTE — Telephone Encounter (Signed)
Spoke with pt's daughter regarding appt Daughter will call back to schedule

## 2019-01-07 ENCOUNTER — Encounter: Payer: Self-pay | Admitting: Family

## 2019-01-07 ENCOUNTER — Ambulatory Visit (INDEPENDENT_AMBULATORY_CARE_PROVIDER_SITE_OTHER): Payer: Medicare Other | Admitting: Family

## 2019-01-07 ENCOUNTER — Ambulatory Visit: Payer: Medicare Other | Admitting: Family

## 2019-01-07 ENCOUNTER — Other Ambulatory Visit: Payer: Self-pay

## 2019-01-07 VITALS — BP 130/86 | HR 71 | Temp 96.0°F | Resp 20 | Ht 64.0 in | Wt 226.0 lb

## 2019-01-07 DIAGNOSIS — R0789 Other chest pain: Secondary | ICD-10-CM

## 2019-01-07 DIAGNOSIS — R079 Chest pain, unspecified: Secondary | ICD-10-CM

## 2019-01-07 DIAGNOSIS — I10 Essential (primary) hypertension: Secondary | ICD-10-CM

## 2019-01-07 DIAGNOSIS — N184 Chronic kidney disease, stage 4 (severe): Secondary | ICD-10-CM

## 2019-01-07 DIAGNOSIS — Z09 Encounter for follow-up examination after completed treatment for conditions other than malignant neoplasm: Secondary | ICD-10-CM

## 2019-01-07 NOTE — Progress Notes (Signed)
Subjective:    Patient ID: [name], female    DOB: Aug 29, 1923, 85 y.o.   MRN: [id number]  Chief Complaint  Patient presents with  . Hospitalization Follow-up     HPI Pt presents to the office today for hospital follow up. She states she was at her eye doctor and felt sick and started having chest pain. She went to the hospital and was admitted for further work-up. Her troponin was negative, Echo stable EF. Her VA scan was negative for PE.   She denies any chest pain or SOB at this time.  She states she is doing well.   Her BP is stable today. No edema, palpitation present.    Review of Systems  All other systems reviewed and are negative.      Objective:   Physical Exam Vitals signs reviewed.  Constitutional:      General: She is not in acute distress.    Appearance: She is well-developed.  HENT:     Head: Normocephalic and atraumatic.     Right Ear: Tympanic membrane normal.     Left Ear: Tympanic membrane normal.  Eyes:     Pupils: Pupils are equal, round, and reactive to light.  Neck:     Musculoskeletal: Normal range of motion and neck supple.     Thyroid: No thyromegaly.  Cardiovascular:     Rate and Rhythm: Normal rate and regular rhythm.     Heart sounds: Normal heart sounds. No murmur.  Pulmonary:     Effort: Pulmonary effort is normal. No respiratory distress.     Breath sounds: Normal breath sounds. No wheezing.  Abdominal:     General: Bowel sounds are normal. There is no distension.     Palpations: Abdomen is soft.     Tenderness: There is no abdominal tenderness.  Musculoskeletal: Normal range of motion.        General: No tenderness.  Skin:    General: Skin is warm and dry.  Neurological:     Mental Status: She is alert and oriented to person, place, and time.     Cranial Nerves: No cranial nerve deficit.     Gait: Gait abnormal.     Deep Tendon Reflexes: Reflexes are normal and symmetric.     Comments: Walking with a cane, generalized  weakness  Psychiatric:        Behavior: Behavior normal.        Thought Content: Thought content normal.        Judgment: Judgment normal.       BP 130/86   Pulse 71   Temp (!) 96 F (35.6 C) (Temporal)   Resp 20   Ht _0  (1.626 m)   Wt 226 lb (102.5 kg)   SpO2 98%   BMI 38.79 kg/m      Assessment & Plan:  [name] comes in today with chief complaint of Hospitalization Follow-up   Diagnosis and orders addressed:  1. Hospital discharge follow-up [facility] notes reviewed - CMP14+EGFR - CBC with Differential/Platelet  2. Chest pain, unspecified type - CMP14+EGFR - CBC with Differential/Platelet  3. Morbid obesity ([place]) - CMP14+EGFR - CBC with Differential/Platelet  4. Essential hypertension, benign - CMP14+EGFR - CBC with Differential/Platelet  5. Atypical chest pain -Resolved at this time - CMP14+EGFR - CBC with Differential/Platelet  6. Chronic kidney disease (CKD), stage IV (severe) ([facility]) - CMP14+EGFR - CBC with Differential/Platelet   Labs pending Health Maintenance reviewed Diet and exercise encouraged  Follow up plan: 6 months   [name], [name]

## 2019-01-07 NOTE — Patient Instructions (Signed)

## 2019-01-08 LAB — CBC WITH DIFFERENTIAL/PLATELET
Basophils Absolute: 0.4 10*3/uL — ABNORMAL HIGH (ref 0.0–0.2)
Basos: 3 %
EOS (ABSOLUTE): 0.3 10*3/uL (ref 0.0–0.4)
Eos: 2 %
Hematocrit: 36.8 % (ref 34.0–46.6)
Hemoglobin: 11.6 g/dL (ref 11.1–15.9)
Immature Grans (Abs): 0 10*3/uL (ref 0.0–0.1)
Immature Granulocytes: 0 %
Lymphocytes Absolute: 3 10*3/uL (ref 0.7–3.1)
Lymphs: 27 %
MCH: 25 pg — ABNORMAL LOW (ref 26.6–33.0)
MCHC: 31.5 g/dL (ref 31.5–35.7)
MCV: 79 fL (ref 79–97)
Monocytes Absolute: 0.3 10*3/uL (ref 0.1–0.9)
Monocytes: 3 %
NRBC: 1 % — ABNORMAL HIGH (ref 0–0)
Neutrophils Absolute: 7.1 10*3/uL — ABNORMAL HIGH (ref 1.4–7.0)
Neutrophils: 65 %
Platelets: 272 10*3/uL (ref 150–450)
RBC: 4.64 x10E6/uL (ref 3.77–5.28)
RDW: 18.8 % — ABNORMAL HIGH (ref 11.7–15.4)
WBC: 11.1 10*3/uL — ABNORMAL HIGH (ref 3.4–10.8)

## 2019-01-08 LAB — CMP14+EGFR
ALT: 16 IU/L (ref 0–32)
AST: 27 IU/L (ref 0–40)
Albumin/Globulin Ratio: 1.5 (ref 1.2–2.2)
Albumin: 4.3 g/dL (ref 3.5–4.6)
Alkaline Phosphatase: 54 IU/L (ref 39–117)
BUN/Creatinine Ratio: 13 (ref 12–28)
BUN: 24 mg/dL (ref 10–36)
Bilirubin Total: 0.5 mg/dL (ref 0.0–1.2)
CO2: 25 mmol/L (ref 20–29)
Calcium: 10.6 mg/dL — ABNORMAL HIGH (ref 8.7–10.3)
Chloride: 99 mmol/L (ref 96–106)
Creatinine, Ser: 1.85 mg/dL — ABNORMAL HIGH (ref 0.57–1.00)
GFR calc Af Amer: 27 mL/min/{1.73_m2} — ABNORMAL LOW (ref >59)
GFR calc non Af Amer: 23 mL/min/{1.73_m2} — ABNORMAL LOW (ref >59)
Globulin, Total: 2.8 g/dL (ref 1.5–4.5)
Glucose: 88 mg/dL (ref 65–99)
Potassium: 4.3 mmol/L (ref 3.5–5.2)
Sodium: 141 mmol/L (ref 134–144)
Total Protein: 7.1 g/dL (ref 6.0–8.5)

## 2019-01-13 ENCOUNTER — Ambulatory Visit (INDEPENDENT_AMBULATORY_CARE_PROVIDER_SITE_OTHER): Payer: Medicare Other

## 2019-01-13 ENCOUNTER — Other Ambulatory Visit: Payer: Self-pay

## 2019-01-13 DIAGNOSIS — I129 Hypertensive chronic kidney disease with stage 1 through stage 4 chronic kidney disease, or unspecified chronic kidney disease: Secondary | ICD-10-CM | POA: Diagnosis not present

## 2019-01-13 DIAGNOSIS — G40909 Epilepsy, unspecified, not intractable, without status epilepticus: Secondary | ICD-10-CM

## 2019-01-13 DIAGNOSIS — I739 Peripheral vascular disease, unspecified: Secondary | ICD-10-CM

## 2019-01-13 DIAGNOSIS — M1990 Unspecified osteoarthritis, unspecified site: Secondary | ICD-10-CM

## 2019-01-13 DIAGNOSIS — N184 Chronic kidney disease, stage 4 (severe): Secondary | ICD-10-CM | POA: Diagnosis not present

## 2019-01-13 DIAGNOSIS — M549 Dorsalgia, unspecified: Secondary | ICD-10-CM

## 2019-01-13 DIAGNOSIS — G8929 Other chronic pain: Secondary | ICD-10-CM

## 2019-01-13 DIAGNOSIS — M519 Unspecified thoracic, thoracolumbar and lumbosacral intervertebral disc disorder: Secondary | ICD-10-CM

## 2019-01-13 DIAGNOSIS — F0390 Unspecified dementia without behavioral disturbance: Secondary | ICD-10-CM

## 2019-01-13 DIAGNOSIS — Z8673 Personal history of transient ischemic attack (TIA), and cerebral infarction without residual deficits: Secondary | ICD-10-CM

## 2019-01-13 DIAGNOSIS — Z9181 History of falling: Secondary | ICD-10-CM

## 2019-01-13 DIAGNOSIS — D631 Anemia in chronic kidney disease: Secondary | ICD-10-CM | POA: Diagnosis not present

## 2019-01-13 DIAGNOSIS — M109 Gout, unspecified: Secondary | ICD-10-CM

## 2019-01-14 ENCOUNTER — Other Ambulatory Visit: Payer: Self-pay | Admitting: Allergy and Immunology

## 2019-01-22 ENCOUNTER — Telehealth: Payer: Self-pay | Admitting: Family

## 2019-01-22 MED ORDER — METOPROLOL TARTRATE 25 MG PO TABS: 12.5000 mg | ORAL_TABLET | Freq: Two times a day (BID) | ORAL | 5 refills | Status: DC

## 2019-01-22 NOTE — Telephone Encounter (Signed)
Prescription sent to pharmacy.

## 2019-01-22 NOTE — Telephone Encounter (Signed)
Please refill if appropriate

## 2019-02-01 ENCOUNTER — Other Ambulatory Visit: Payer: Self-pay | Admitting: Allergy and Immunology

## 2019-02-15 ENCOUNTER — Other Ambulatory Visit: Payer: Self-pay | Admitting: Allergy and Immunology

## 2019-03-04 ENCOUNTER — Other Ambulatory Visit: Payer: Self-pay | Admitting: Allergy and Immunology

## 2019-03-04 ENCOUNTER — Other Ambulatory Visit: Payer: Self-pay | Admitting: Family

## 2019-03-12 ENCOUNTER — Other Ambulatory Visit: Payer: Self-pay

## 2019-03-12 ENCOUNTER — Emergency Department (HOSPITAL_COMMUNITY): Payer: Medicare Other

## 2019-03-12 ENCOUNTER — Encounter (HOSPITAL_COMMUNITY): Payer: Self-pay

## 2019-03-12 ENCOUNTER — Observation Stay (HOSPITAL_COMMUNITY)
Admission: EM | Admit: 2019-03-12 | Discharge: 2019-03-13 | Disposition: A | Discharge status: Home / Self Care | Payer: Medicare Other | Attending: Internal Medicine | Admitting: Internal Medicine

## 2019-03-12 DIAGNOSIS — I1310 Hypertensive heart and chronic kidney disease without heart failure, with stage 1 through stage 4 chronic kidney disease, or unspecified chronic kidney disease: Secondary | ICD-10-CM | POA: Insufficient documentation

## 2019-03-12 DIAGNOSIS — R112 Nausea with vomiting, unspecified: Secondary | ICD-10-CM

## 2019-03-12 DIAGNOSIS — Z9049 Acquired absence of other specified parts of digestive tract: Secondary | ICD-10-CM | POA: Diagnosis not present

## 2019-03-12 DIAGNOSIS — R42 Dizziness and giddiness: Secondary | ICD-10-CM | POA: Insufficient documentation

## 2019-03-12 DIAGNOSIS — E049 Nontoxic goiter, unspecified: Secondary | ICD-10-CM | POA: Diagnosis not present

## 2019-03-12 DIAGNOSIS — Z8673 Personal history of transient ischemic attack (TIA), and cerebral infarction without residual deficits: Secondary | ICD-10-CM

## 2019-03-12 DIAGNOSIS — R918 Other nonspecific abnormal finding of lung field: Secondary | ICD-10-CM | POA: Diagnosis not present

## 2019-03-12 DIAGNOSIS — Z7982 Long term (current) use of aspirin: Secondary | ICD-10-CM | POA: Insufficient documentation

## 2019-03-12 DIAGNOSIS — I4510 Unspecified right bundle-branch block: Secondary | ICD-10-CM | POA: Diagnosis not present

## 2019-03-12 DIAGNOSIS — N2581 Secondary hyperparathyroidism of renal origin: Secondary | ICD-10-CM | POA: Diagnosis not present

## 2019-03-12 DIAGNOSIS — I6782 Cerebral ischemia: Secondary | ICD-10-CM | POA: Diagnosis not present

## 2019-03-12 DIAGNOSIS — R197 Diarrhea, unspecified: Secondary | ICD-10-CM | POA: Diagnosis not present

## 2019-03-12 DIAGNOSIS — Z79899 Other long term (current) drug therapy: Secondary | ICD-10-CM | POA: Insufficient documentation

## 2019-03-12 DIAGNOSIS — Z7951 Long term (current) use of inhaled steroids: Secondary | ICD-10-CM | POA: Diagnosis not present

## 2019-03-12 DIAGNOSIS — Z87898 Personal history of other specified conditions: Secondary | ICD-10-CM

## 2019-03-12 DIAGNOSIS — Z20828 Contact with and (suspected) exposure to other viral communicable diseases: Secondary | ICD-10-CM | POA: Insufficient documentation

## 2019-03-12 DIAGNOSIS — K449 Diaphragmatic hernia without obstruction or gangrene: Secondary | ICD-10-CM | POA: Insufficient documentation

## 2019-03-12 DIAGNOSIS — I700 Atherosclerosis of aorta: Secondary | ICD-10-CM | POA: Insufficient documentation

## 2019-03-12 DIAGNOSIS — F411 Generalized anxiety disorder: Secondary | ICD-10-CM | POA: Diagnosis present

## 2019-03-12 DIAGNOSIS — N184 Chronic kidney disease, stage 4 (severe): Secondary | ICD-10-CM | POA: Diagnosis not present

## 2019-03-12 DIAGNOSIS — Z8249 Family history of ischemic heart disease and other diseases of the circulatory system: Secondary | ICD-10-CM | POA: Diagnosis not present

## 2019-03-12 DIAGNOSIS — I10 Essential (primary) hypertension: Secondary | ICD-10-CM | POA: Diagnosis present

## 2019-03-12 DIAGNOSIS — G8929 Other chronic pain: Secondary | ICD-10-CM | POA: Insufficient documentation

## 2019-03-12 DIAGNOSIS — Z6841 Body Mass Index (BMI) 40.0 and over, adult: Secondary | ICD-10-CM | POA: Insufficient documentation

## 2019-03-12 DIAGNOSIS — G40909 Epilepsy, unspecified, not intractable, without status epilepticus: Secondary | ICD-10-CM | POA: Insufficient documentation

## 2019-03-12 DIAGNOSIS — M109 Gout, unspecified: Secondary | ICD-10-CM | POA: Insufficient documentation

## 2019-03-12 DIAGNOSIS — R079 Chest pain, unspecified: Secondary | ICD-10-CM | POA: Diagnosis present

## 2019-03-12 DIAGNOSIS — M545 Low back pain: Secondary | ICD-10-CM | POA: Insufficient documentation

## 2019-03-12 DIAGNOSIS — D631 Anemia in chronic kidney disease: Secondary | ICD-10-CM | POA: Insufficient documentation

## 2019-03-12 LAB — COMPREHENSIVE METABOLIC PANEL
ALT: 12 U/L (ref 0–44)
AST: 29 U/L (ref 15–41)
Albumin: 3.8 g/dL (ref 3.5–5.0)
Alkaline Phosphatase: 43 U/L (ref 38–126)
Anion gap: 12 (ref 5–15)
BUN: 17 mg/dL (ref 8–23)
CO2: 23 mmol/L (ref 22–32)
Calcium: 10 mg/dL (ref 8.9–10.3)
Chloride: 104 mmol/L (ref 98–111)
Creatinine, Ser: 1.59 mg/dL — ABNORMAL HIGH (ref 0.44–1.00)
GFR calc Af Amer: 32 mL/min — ABNORMAL LOW (ref >60)
GFR calc non Af Amer: 28 mL/min — ABNORMAL LOW (ref >60)
Glucose, Bld: 115 mg/dL — ABNORMAL HIGH (ref 70–99)
Potassium: 4 mmol/L (ref 3.5–5.1)
Sodium: 139 mmol/L (ref 135–145)
Total Bilirubin: 0.9 mg/dL (ref 0.3–1.2)
Total Protein: 6.7 g/dL (ref 6.5–8.1)

## 2019-03-12 LAB — CBC WITH DIFFERENTIAL/PLATELET
Abs Immature Granulocytes: 0.07 10*3/uL (ref 0.00–0.07)
Basophils Absolute: 0.4 10*3/uL — ABNORMAL HIGH (ref 0.0–0.1)
Basophils Relative: 3 %
Eosinophils Absolute: 0.2 10*3/uL (ref 0.0–0.5)
Eosinophils Relative: 1 %
HCT: 33.9 % — ABNORMAL LOW (ref 36.0–46.0)
Hemoglobin: 11.4 g/dL — ABNORMAL LOW (ref 12.0–15.0)
Immature Granulocytes: 1 %
Lymphocytes Relative: 18 %
Lymphs Abs: 2.7 10*3/uL (ref 0.7–4.0)
MCH: 25.7 pg — ABNORMAL LOW (ref 26.0–34.0)
MCHC: 33.6 g/dL (ref 30.0–36.0)
MCV: 76.5 fL — ABNORMAL LOW (ref 80.0–100.0)
Monocytes Absolute: 0.2 10*3/uL (ref 0.1–1.0)
Monocytes Relative: 2 %
Neutro Abs: 11.5 10*3/uL — ABNORMAL HIGH (ref 1.7–7.7)
Neutrophils Relative %: 75 %
Platelets: 249 10*3/uL (ref 150–400)
RBC: 4.43 MIL/uL (ref 3.87–5.11)
RDW: 18.2 % — ABNORMAL HIGH (ref 11.5–15.5)
WBC: 15 10*3/uL — ABNORMAL HIGH (ref 4.0–10.5)
nRBC: 0.5 % — ABNORMAL HIGH (ref 0.0–0.2)

## 2019-03-12 LAB — URINALYSIS, ROUTINE W REFLEX MICROSCOPIC
Bilirubin Urine: NEGATIVE
Glucose, UA: NEGATIVE mg/dL
Hgb urine dipstick: NEGATIVE
Ketones, ur: 5 mg/dL — AB
Leukocytes,Ua: NEGATIVE
Nitrite: NEGATIVE
Protein, ur: NEGATIVE mg/dL
Specific Gravity, Urine: 1.005 (ref 1.005–1.030)
pH: 9 — ABNORMAL HIGH (ref 5.0–8.0)

## 2019-03-12 LAB — TROPONIN I (HIGH SENSITIVITY)
Troponin I (High Sensitivity): 12 ng/L (ref <18)
Troponin I (High Sensitivity): 17 ng/L (ref <18)

## 2019-03-12 LAB — LIPASE, BLOOD: Lipase: 42 U/L (ref 11–51)

## 2019-03-12 MED ORDER — SODIUM CHLORIDE 0.9 % IV BOLUS: 500.0000 mL | Freq: Once | INTRAVENOUS | Status: DC

## 2019-03-12 MED ORDER — METOPROLOL TARTRATE 5 MG/5ML IV SOLN
5.0000 mg | Freq: Once | INTRAVENOUS | Status: AC
  Administered 2019-03-12: 5 mg via INTRAVENOUS
  Filled 2019-03-12: qty 5

## 2019-03-12 MED ORDER — ACETAMINOPHEN 325 MG PO TABS: 650.0000 mg | ORAL_TABLET | Freq: Four times a day (QID) | ORAL | Status: DC | PRN

## 2019-03-12 MED ORDER — IOHEXOL 350 MG/ML SOLN
80.0000 mL | Freq: Once | INTRAVENOUS | Status: AC | PRN
  Administered 2019-03-12: 80 mL via INTRAVENOUS

## 2019-03-12 MED ORDER — ACETAMINOPHEN 650 MG RE SUPP: 650.0000 mg | Freq: Four times a day (QID) | RECTAL | Status: DC | PRN

## 2019-03-12 MED ORDER — LEVETIRACETAM ER 500 MG PO TB24
2000.0000 mg | ORAL_TABLET | Freq: Every day | ORAL | Status: DC
  Administered 2019-03-13: 2000 mg via ORAL
  Filled 2019-03-12 (×3): qty 4

## 2019-03-12 MED ORDER — SODIUM CHLORIDE 0.9 % IV SOLN
INTRAVENOUS | Status: AC
  Administered 2019-03-12: 22:00:00 via INTRAVENOUS

## 2019-03-12 MED ORDER — LAMOTRIGINE 25 MG PO TABS
25.0000 mg | ORAL_TABLET | Freq: Two times a day (BID) | ORAL | Status: DC
  Administered 2019-03-12 – 2019-03-13 (×2): 25 mg via ORAL
  Filled 2019-03-12 (×2): qty 1

## 2019-03-12 MED ORDER — SODIUM CHLORIDE 0.9% FLUSH
3.0000 mL | Freq: Two times a day (BID) | INTRAVENOUS | Status: DC
  Administered 2019-03-12: 3 mL via INTRAVENOUS

## 2019-03-12 MED ORDER — MECLIZINE HCL 25 MG PO TABS: 25.0000 mg | ORAL_TABLET | Freq: Three times a day (TID) | ORAL | Status: DC | PRN

## 2019-03-12 MED ORDER — METOPROLOL TARTRATE 25 MG PO TABS
12.5000 mg | ORAL_TABLET | Freq: Two times a day (BID) | ORAL | Status: DC
  Administered 2019-03-13: 12.5 mg via ORAL
  Filled 2019-03-12: qty 1

## 2019-03-12 MED ORDER — HEPARIN SODIUM (PORCINE) 5000 UNIT/ML IJ SOLN
5000.0000 [IU] | Freq: Three times a day (TID) | INTRAMUSCULAR | Status: DC
  Administered 2019-03-12 – 2019-03-13 (×3): 5000 [IU] via SUBCUTANEOUS
  Filled 2019-03-12 (×3): qty 1

## 2019-03-12 MED ORDER — SODIUM CHLORIDE 0.9 % IV BOLUS
500.0000 mL | Freq: Once | INTRAVENOUS | Status: AC
  Administered 2019-03-12: 500 mL via INTRAVENOUS

## 2019-03-12 MED ORDER — ALUM & MAG HYDROXIDE-SIMETH 200-200-20 MG/5ML PO SUSP: 15.0000 mL | Freq: Four times a day (QID) | ORAL | Status: DC | PRN

## 2019-03-12 MED ORDER — FAMOTIDINE IN NACL 20-0.9 MG/50ML-% IV SOLN
20.0000 mg | Freq: Two times a day (BID) | INTRAVENOUS | Status: DC
  Administered 2019-03-12 – 2019-03-13 (×2): 20 mg via INTRAVENOUS
  Filled 2019-03-12 (×2): qty 50

## 2019-03-12 MED ORDER — ASPIRIN EC 81 MG PO TBEC
81.0000 mg | DELAYED_RELEASE_TABLET | Freq: Every day | ORAL | Status: DC
  Administered 2019-03-12 – 2019-03-13 (×2): 81 mg via ORAL
  Filled 2019-03-12 (×2): qty 1

## 2019-03-12 MED ORDER — ONDANSETRON HCL 4 MG PO TABS: 4.0000 mg | ORAL_TABLET | Freq: Four times a day (QID) | ORAL | Status: DC | PRN

## 2019-03-12 MED ORDER — FENTANYL CITRATE (PF) 100 MCG/2ML IJ SOLN
25.0000 ug | Freq: Once | INTRAMUSCULAR | Status: AC
  Administered 2019-03-12: 25 ug via INTRAVENOUS
  Filled 2019-03-12: qty 2

## 2019-03-12 MED ORDER — ONDANSETRON HCL 4 MG/2ML IJ SOLN: 4.0000 mg | Freq: Four times a day (QID) | INTRAMUSCULAR | Status: DC | PRN

## 2019-03-12 MED ORDER — PANTOPRAZOLE SODIUM 40 MG PO TBEC
40.0000 mg | DELAYED_RELEASE_TABLET | Freq: Every day | ORAL | Status: DC
  Administered 2019-03-12 – 2019-03-13 (×2): 40 mg via ORAL
  Filled 2019-03-12: qty 1
  Filled 2019-03-12: qty 2

## 2019-03-12 NOTE — ED Notes (Addendum)
Patient not available for assessment. Will assess when pt. Returns from [place]

## 2019-03-12 NOTE — ED Notes (Signed)
Attempted to call Son Laverna Peace per pt request. Unable to contact with phone number

## 2019-03-12 NOTE — ED Triage Notes (Signed)
Pt BIB [facility] from home w/ c/o N/V/D and chest pain. Per EMS, pt has had 3 episodes of vomiting. Pt received 8 mg Zofran and 500 mL NS per EMS. Pt is A&Ox4, nad noted.

## 2019-03-12 NOTE — H&P (Addendum)
History and Physical    [name] ZJI:[id number] DOB: 08-Oct-1923 DOA: 03/12/2019  PCP: [name], [name]   Patient coming from: Home   Chief Complaint: N/V/D, vertigo   HPI: [name] is a 85 y.o. female with medical history significant for history of CVA, seizure disorder, hypertension, chronic kidney disease stage IV, and history of mild confusion, now presenting to the emergency department for evaluation of nausea, vomiting, diarrhea, and vertigo.  Patient reports that she had been constipated for about 2 weeks, was taking a medication at home for this that she does not know the name of, felt generally unwell this morning, was experiencing a sensation as though the room was spinning with nausea, and went on to develop nonbloody vomiting and loose stools.  She did not feel well enough to get out of bed today and family eventually called EMS.  Patient also describes some chest discomfort that she reports to be "indigestion."  She also reports some shoulder discomfort that she attributes to a recent fall.  She denies any headache, change in hearing, focal numbness or weakness, but reports blurred vision involving the right eye for several days.  She denies fevers, chills, or cough.  ED Course: Upon arrival to the ED, patient is found to be afebrile, saturating well on room air, mildly tachypneic, tachycardic in the 130s, and with stable blood pressure.  EKG features sinus tachycardia with rate 134, RBBB, and inferior ST depressions.  Noncontrast head CT is negative for acute intracranial abnormality.  CTA chest/abdomen/pelvis is negative for aortic dissection or aneurysm, negative for PE, but notable for scattered pulmonary nodules bilaterally.  Chemistry panel features a creatinine 1.59, consistent with her apparent baseline.  CBC is notable for leukocytosis to 15,000 and a stable mild microcytic anemia.  High-sensitivity troponin is normal x2.  Urinalysis notable for an elevated pH and  ketonuria.  Patient was treated with 500 cc normal saline and fentanyl in the emergency department.  COVID-19 screening test is in process.  Review of Systems:  All other systems reviewed and apart from HPI, are negative.  Past Medical History:  Diagnosis Date   Chronic low back pain    Degenerative arthritis    Eczema    Edema    Gout    Headache    Hiatal hernia    Hypertension    Internal hemorrhoids    Neurogenic claudication    Obesity    Seizure disorder (HCC)    Seizures (HCC)    Stroke (HCC)    Urticaria     Past Surgical History:  Procedure Laterality Date   ABDOMINAL HYSTERECTOMY     APPENDECTOMY     APPENDECTOMY     bilateral knees     cataracts     CHOLECYSTECTOMY     mammoplasty reduction     NEUROPLASTY / TRANSPOSITION MEDIAN NERVE AT CARPAL TUNNEL BILATERAL     RE-EXCISION OF BREAST LUMPECTOMY     TUBAL LIGATION       reports that she has never smoked. She has never used smokeless tobacco. She reports that she does not drink alcohol or use drugs.  Allergies  Allergen Reactions   Advicor [Niacin-Lovastatin Er] Other (See Comments)    Reaction unknown to daughter   Caduet [Amlodipine-Atorvastatin] Other (See Comments)    Reaction unknown to daughter   Codeine Other (See Comments)    Reaction unknown to daughter   Dilantin [Phenytoin] Other (See Comments)    Reaction unknown to  daughter   Lescol [Fluvastatin] Other (See Comments)    Reaction unknown to daughter   Pamelor [Nortriptyline] Other (See Comments)    Reaction unknown to daughter   Penicillins Hives    Did it involve swelling of the face/tongue/throat, SOB, or low BP? Unk Did it involve sudden or severe rash/hives, skin peeling, or any reaction on the inside of your mouth or nose? Unk Did you need to seek medical attention at a hospital or doctor's office? Unk When did it last happen? "it was a long time ago" If all above answers are NO, may proceed with  cephalosporin use.    Sulfa Antibiotics Hives   Trileptal [Oxcarbazepine] Other (See Comments)    Reaction unknown to daughter   Ultram [Tramadol] Other (See Comments)    Reaction unknown to daughter   Welchol [Colesevelam] Other (See Comments)    Reaction unknown to daughter   Latex Rash   Tomato Rash    Family History  Problem Relation Age of Onset   Diabetes Mother    Heart disease Mother    Heart disease Father    Cancer Sister    Cancer Brother    Cancer Sister      Prior to Admission medications   Medication Sig Start Date End Date Taking? Authorizing Provider  acetaminophen (TYLENOL) 500 MG tablet Take 500-1,000 mg by mouth every 8 (eight) hours as needed for mild pain or headache.     [provider]  allopurinol (ZYLOPRIM) 100 MG tablet TAKE 1/2 TABLET BY MOUTH EVERY DAY 03/05/19   Claretta Fraise, MD  aspirin 81 MG tablet Take 81 mg by mouth daily.    [provider]  cetirizine (ZYRTEC) 10 MG tablet TAKE 1 TABLET BY MOUTH EVERY DAY 03/05/19   Claretta Fraise, MD  escitalopram (LEXAPRO) 5 MG tablet Take 1 tablet (5 mg total) by mouth daily for 14 days, THEN 2 tablets (10 mg total) daily for 14 days. Patient not taking: Reported on 12/30/2018 03/17/18 12/30/18  [name], [name]  famotidine (PEPCID) 40 MG tablet TAKE 1 TABLET BY MOUTH EVERY DAY 02/16/19   Kozlow, Donnamarie Poag, MD  Fluticasone Furoate (ARNUITY ELLIPTA) 200 MCG/ACT AEPB Inhale 1 puff into the lungs daily. 02/02/19   Kozlow, Donnamarie Poag, MD  ketoconazole (NIZORAL) 2 % cream Apply to the affected areas underneath breasts, belly and in groin region ONCE daily x2-4 weeks. Patient taking differently: Apply 1 application topically See admin instructions. Apply to the affected areas underneath breasts, belly and in groin region ONCE daily for 2-4 weeks 09/16/18   Janora Norlander, DO  lamoTRIgine (LAMICTAL) 25 MG tablet Take 2 tablets (50 mg total) by mouth 2 (two) times daily. Patient taking  differently: Take 25 mg by mouth 2 (two) times daily.  12/17/18   Kathrynn Ducking, MD  levETIRAcetam (KEPPRA XR) 500 MG 24 hr tablet Take 4 tablets (2,000 mg total) by mouth at bedtime. 11/10/18   Suzzanne Cloud, NP  metolazone (ZAROXOLYN) 2.5 MG tablet TAKE 1 TABLET BY MOUTH ON MONDAYS, WEDNESDAYS, AND FRIDAYS Patient taking differently: Take 2.5 mg by mouth every Monday, Wednesday, and Friday.  10/31/18   [name], [name]  metoprolol tartrate (LOPRESSOR) 25 MG tablet Take 0.5 tablets (12.5 mg total) by mouth 2 (two) times daily. 01/22/19   Hawks, Alyse Low A, [name]  mometasone (ELOCON) 0.1 % ointment Apply to skin once daily as directed after bathing. 03/04/18   Kozlow, Donnamarie Poag, MD  montelukast (SINGULAIR)  10 MG tablet TAKE 1 TABLET BY MOUTH AT BEDTIME 02/16/19   Kozlow, Donnamarie Poag, MD  Multiple Vitamins-Minerals (ONE-A-DAY WOMENS 50 PLUS) TABS Take 1 tablet by mouth daily with breakfast.    [provider]  nystatin-triamcinolone ointment (MYCOLOG) Apply 1 application topically 2 (two) times daily. 11/16/15   [name], [name]  omeprazole (PRILOSEC) 40 MG capsule Take 1 capsule (40 mg total) by mouth daily. Patient taking differently: Take 40 mg by mouth daily as needed (for reflux).  07/03/18   Kozlow, Donnamarie Poag, MD  Propylene Glycol (SYSTANE BALANCE OP) Place 1 drop into both eyes 2 (two) times daily.     [provider]  triamcinolone cream (KENALOG) 0.1 % Apply 1 application topically 2 (two) times daily. Patient taking differently: Apply 1 application topically 2 (two) times daily as needed (for eczema).  03/18/18   Kozlow, Donnamarie Poag, MD  trimethoprim-polymyxin b (POLYTRIM) ophthalmic solution Place 1 drop into the right eye every 6 (six) hours. 12/18/18   [name], [name]    Physical Exam: Vitals:   03/12/19 1800 03/12/19 1830 03/12/19 1943 03/12/19 2030  BP: (!) 141/126 (!) 154/82 (!) 149/67 139/82  Pulse: (!) 124 (!) 135 (!) 118 (!) 124  Resp: (!) 26 20 (!) 22 (!) 21  Temp:       TempSrc:      SpO2: 97% 99% 100% 97%  Height:        Constitutional: NAD, calm  Eyes: PERTLA, lids and conjunctivae normal ENMT: Mucous membranes are moist. Posterior pharynx clear of any exudate or lesions.   Neck: normal, supple, no masses  Respiratory:   no wheezing, no crackles. Normal respiratory effort. No accessory muscle use.  Cardiovascular: Rate ~120 and regular. No extremity edema.   Abdomen: No distension, no tenderness, soft. Bowel sounds active.  Musculoskeletal: no clubbing / cyanosis. No joint deformity upper and lower extremities.   Skin: no significant rashes, lesions, ulcers. Poor turgor. Neurologic: CN 2-12 grossly intact. Sensation to light touch intact. Strength 5/5 in all 4 limbs.  Psychiatric: Alert and oriented to person and place, not oriented to month or year. Pleasant, cooperative.     Labs on Admission: I have personally reviewed following labs and imaging studies  CBC: Recent Labs  Lab 03/12/19 1622  WBC 15.0*  NEUTROABS 11.5*  HGB 11.4*  HCT 33.9*  MCV 76.5*  PLT 338   Basic Metabolic Panel: Recent Labs  Lab 03/12/19 1622  NA 139  K 4.0  CL 104  CO2 23  GLUCOSE 115*  BUN 17  CREATININE 1.59*  CALCIUM 10.0   GFR: CrCl cannot be calculated (Unknown ideal weight.). Liver Function Tests: Recent Labs  Lab 03/12/19 1622  AST 29  ALT 12  ALKPHOS 43  BILITOT 0.9  PROT 6.7  ALBUMIN 3.8   Recent Labs  Lab 03/12/19 1622  LIPASE 42   No results for input(s): AMMONIA in the last 168 hours. Coagulation Profile: No results for input(s): INR, PROTIME in the last 168 hours. Cardiac Enzymes: No results for input(s): CKTOTAL, CKMB, CKMBINDEX, TROPONINI in the last 168 hours. BNP (last 3 results) No results for input(s): PROBNP in the last 8760 hours. HbA1C: No results for input(s): HGBA1C in the last 72 hours. CBG: No results for input(s): GLUCAP in the last 168 hours. Lipid Profile: No results for input(s): CHOL, HDL, LDLCALC,  TRIG, CHOLHDL, LDLDIRECT in the last 72 hours. Thyroid Function Tests: No results for input(s): TSH, T4TOTAL, FREET4,  T3FREE, THYROIDAB in the last 72 hours. Anemia Panel: No results for input(s): VITAMINB12, FOLATE, FERRITIN, TIBC, IRON, RETICCTPCT in the last 72 hours. Urine analysis:    Component Value Date/Time   COLORURINE STRAW (A) 03/12/2019 1722   APPEARANCEUR CLEAR 03/12/2019 1722   LABSPEC 1.005 03/12/2019 1722   PHURINE 9.0 (H) 03/12/2019 1722   GLUCOSEU NEGATIVE 03/12/2019 1722   HGBUR NEGATIVE 03/12/2019 1722   BILIRUBINUR NEGATIVE 03/12/2019 1722   BILIRUBINUR neg 03/09/2015 1652   KETONESUR 5 (A) 03/12/2019 1722   PROTEINUR NEGATIVE 03/12/2019 1722   UROBILINOGEN negative 03/09/2015 1652   NITRITE NEGATIVE 03/12/2019 1722   LEUKOCYTESUR NEGATIVE 03/12/2019 1722   Sepsis Labs: @LABRCNTIP (procalcitonin:4,lacticidven:4) )No results found for this or any previous visit (from the past 240 hour(s)).   Radiological Exams on Admission: Ct Head Wo Contrast  Result Date: 03/12/2019 CLINICAL DATA:  Persistent vertigo. EXAM: CT HEAD WITHOUT CONTRAST TECHNIQUE: Contiguous axial images were obtained from the base of the skull through the vertex without intravenous contrast. COMPARISON:  10/11/2015. FINDINGS: Brain: No evidence of acute infarction, hemorrhage, hydrocephalus, extra-axial collection or mass lesion/mass effect. Mild periventricular white matter hypoattenuation is noted consistent with chronic microvascular ischemic change. Vascular: No hyperdense vessel or unexpected calcification. Skull: Normal. Negative for fracture or focal lesion. Sinuses/Orbits: Globes and orbits are unremarkable. Sinuses, mastoid air cells and middle ear cavities are clear. Other: None. IMPRESSION: 1. No acute intracranial abnormalities. 2. Mild chronic microvascular ischemic change. Electronically Signed   By: Lajean Manes M.D.   On: 03/12/2019 19:45   Dg Chest Port 1 View  Result Date:  03/12/2019 CLINICAL DATA:  Chest pain vomiting EXAM: PORTABLE CHEST 1 VIEW COMPARISON:  March 28, 2018 FINDINGS: Heart size remains enlarged with aortic tortuosity. Lungs are clear. No acute bone finding. IMPRESSION: 1. No active cardiopulmonary disease. 2. Stable cardiomegaly. Electronically Signed   By: Zetta Bills M.D.   On: 03/12/2019 17:26   Ct Angio Chest/abd/pel For Dissection W And/or W/wo  Result Date: 03/12/2019 CLINICAL DATA:  Nausea vomiting for 3 days with chest pain EXAM: CT ANGIOGRAPHY CHEST, ABDOMEN AND PELVIS TECHNIQUE: Multidetector CT imaging through the chest, abdomen and pelvis was performed using the standard protocol during bolus administration of intravenous contrast. Multiplanar reconstructed images and MIPs were obtained and reviewed to evaluate the vascular anatomy. CONTRAST:  52mL OMNIPAQUE IOHEXOL 350 MG/ML SOLN COMPARISON:  None. FINDINGS: CTA CHEST FINDINGS Cardiovascular: Examination is somewhat limited secondary to patient motion and uncooperative nature. Initial precontrast images demonstrate no hyperdense crescent within the thoracic aorta. Post-contrast images begin at the level of the aortic arch. No aneurysmal dilatation or dissection is identified. The pulmonary artery shows no large central pulmonary embolus. No significant coronary calcifications are noted. No cardiac enlargement is seen. Mediastinum/Nodes: Small sliding-type hiatal hernia is noted. No hilar or mediastinal adenopathy is noted. The thoracic inlet shows diffuse enlargement of the thyroid gland consistent with goiter. Lungs/Pleura: Lungs are well aerated bilaterally. No focal infiltrate is noted. 5 mm nodule is noted within the right upper lobe best seen on image number 13 of series 6. A few smaller scattered subpleural nodules are noted bilaterally. These all measure less than 5 mm. Musculoskeletal: Degenerative changes of the thoracic spine are noted. No acute rib abnormality is seen. The sternum  is unremarkable. No compression deformities are seen. Review of the MIP images confirms the above findings. CTA ABDOMEN AND PELVIS FINDINGS VASCULAR Aorta: Atherosclerotic calcifications are noted without aneurysmal dilatation or dissection. Celiac: Patent without evidence  of aneurysm, dissection, vasculitis or significant stenosis. SMA: Patent without evidence of aneurysm, dissection, vasculitis or significant stenosis. Renals: Both renal arteries are patent without evidence of aneurysm, dissection, vasculitis, fibromuscular dysplasia or significant stenosis. IMA: Patent without evidence of aneurysm, dissection, vasculitis or significant stenosis. Iliacs: Scattered atherosclerotic calcifications are noted without aneurysmal dilatation or dissection. The contrast bolus in the iliac vessels is somewhat limited due to patient difficulty with the scan. Veins: No specific vein abnormality is noted. Review of the MIP images confirms the above findings. NON-VASCULAR Hepatobiliary: No focal liver abnormality is seen. Status post cholecystectomy. No biliary dilatation. Pancreas: Unremarkable. No pancreatic ductal dilatation or surrounding inflammatory changes. Spleen: Normal in size without focal abnormality. Adrenals/Urinary Tract: Kidneys are well visualized bilaterally with multiple hypodensities consistent with cysts. The adrenal glands are within normal limits. No renal calculi or obstructive changes are noted. The bladder is well distended. Stomach/Bowel: Colon is decompressed. The appendix is not well visualized consistent with prior surgical history. No small bowel abnormality is noted. Hiatal hernia is again seen. Lymphatic: No enlarged abdominal or pelvic lymph nodes. Reproductive: Status post hysterectomy. No adnexal masses. Other: No abdominal wall hernia or abnormality. No abdominopelvic ascites. Musculoskeletal: Degenerative changes of lumbar spine are noted. Review of the MIP images confirms the above  findings. IMPRESSION: No evidence of aortic dissection or aneurysmal dilatation. No definitive pulmonary emboli are seen. Scattered small nodules within both lungs measuring less than 5 mm. No follow-up needed if patient is low-risk (and has no known or suspected primary neoplasm). Non-contrast chest CT can be considered in 12 months if patient is high-risk. This recommendation follows the consensus statement: Guidelines for Management of Incidental Pulmonary Nodules Detected on CT Images: From the Fleischner Society 2017; Radiology 2017; 284:228-243. Hiatal hernia is seen. Renal cystic changes and diffuse goiter. Electronically Signed   By: Inez Catalina M.D.   On: 03/12/2019 20:10    EKG: Independently reviewed. Sinus tachycardia (rate 134), RBBB, inferior ST-depressions.   Assessment/Plan   1. Chest pain  - Presents with one day of N/V/D and chest discomfort  - HS troponin is normal x2 >2 hrs apart, EKG with sinus tachycardia and non-specific ST abnormality, and CTA c/a/p is negative for acute aortic findings or PE  - The patient describes this as "indigestion"  - She has been tachycardic in 130's and the discomfort could also be related to the rapid rate  - Continue cardiac monitoring, continue H2-blocker and PPI, trial GI cocktail, continue ASA and beta-blocker   2. Vertigo  - Patient reports hx of vertigo with increasingly frequent episodes and has been in bed all day leading up to her presentation for this reason  - There is no acute findings on head CT  - She denies tinnitus or hearing loss, reports recent blurred vision involving right eye  - Check MRI, continue supportive care    3. Seizures  - Patient had a breakthrough seizure last week per daughter and is scheduled to see her neurologist, Dr. Jannifer Franklin, on 03/16/19   - Continue Lamictal and Keppra   4. Nausea, vomiting, diarrhea  - Presents with one day of N/V/D - Abdominal exam is benign and no acute findings noted on CT in ED  -  Patient reports 2 weeks of constipation for which she has been taking an OTC medication and she attributes her GI symptoms to that; she also describes recurrent vertigo that could be contributing to her N/V  - No N/V/D noted since arrival, continue  supportive care, IVF hydration, monitor electrolytes    5. CKD IV  - SCr is 1.59 on admission, consistent with apparent baseline  - Renally-dose medications, monitor   6. Lung nodules  - Noted incidentally on CT in ED  - Outpatient follow-up recommended     PPE: Gown, gloves, mask, face shield  DVT prophylaxis: sq heparin  Code Status: Full, confirmed with patient's family Family Communication: Discussed with daughter Leann Mayweather) by phone  Consults called: None   Admission status: Observation     Vianne Bulls, MD [facility] Pager [phone]  If 7PM-7AM, please contact night-coverage [email or web address] Password [facility]  03/12/2019, 9:38 PM

## 2019-03-12 NOTE — ED Notes (Signed)
[name] would like an update when possible at [phone]

## 2019-03-12 NOTE — ED Notes (Signed)
Dr. Ralene Bathe made aware of patient's tachycardia.

## 2019-03-12 NOTE — ED Provider Notes (Signed)
[facility] EMERGENCY DEPARTMENT Provider Note   CSN: [id number] Arrival date & time: 03/12/19  1616     History   Chief Complaint Chief Complaint  Patient presents with  . Nausea  . Vomiting  . Diarrhea    HPI [name] is a 85 y.o. female.     The history is provided by the patient and medical records. No language interpreter was used.  Diarrhea  [name] is a 86 y.o. female who presents to the Emergency Department complaining of vomiting and diarrhea. Level V caveat due to confusion. History is provided by patient and EMS. She presents the emergency department by EMS complaining of acute on chronic vomiting and diarrhea. She has a history of similar recurrent episodes in the past. Today she developed nausea and vomiting with diarrhea on ED arrival. She complains of severe central chest pain that began sometime today. The pain radiates to her neck. She is unable to further characterize the pain. They reports of fevers, abdominal pain, dysuria. She states that she lives alone but has been with her daughter recently. No known COVID 19 exposures. Past Medical History:  Diagnosis Date  . Chronic low back pain   . Degenerative arthritis   . Eczema   . Edema   . Gout   . Headache   . Hiatal hernia   . Hypertension   . Internal hemorrhoids   . Neurogenic claudication   . Obesity   . Seizure disorder ([place])   . Seizures ([place])   . Stroke ([place])   . Urticaria     Patient Active Problem List   Diagnosis Date Noted  . Nausea vomiting and diarrhea 03/12/2019  . Lung nodules 03/12/2019  . Atypical chest pain 12/31/2018  . Asthma 02/12/2017  . Chest pain 11/13/2016  . History of seizures 11/13/2016  . History of stroke 11/13/2016  . Allergic urticaria 08/30/2016  . GAD (generalized anxiety disorder) 08/30/2016  . Gout 08/30/2016  . Acute renal failure superimposed on stage 3 chronic kidney disease ([place]) 10/11/2015  . Neurogenic claudication    . Morbid obesity ([place])   . Edema   . Essential hypertension, benign 09/30/2014  . Secondary hyperparathyroidism, renal ([place]) 09/30/2014  . Proteinuria 09/30/2014  . Anemia associated with chronic renal failure 09/30/2014  . Chronic kidney disease (CKD), stage IV (severe) ([place]) 09/30/2014  . Hiatal hernia 08/21/2012    Past Surgical History:  Procedure Laterality Date  . ABDOMINAL HYSTERECTOMY    . APPENDECTOMY    . APPENDECTOMY    . bilateral knees    . cataracts    . CHOLECYSTECTOMY    . mammoplasty reduction    . NEUROPLASTY / TRANSPOSITION MEDIAN NERVE AT CARPAL TUNNEL BILATERAL    . RE-EXCISION OF BREAST LUMPECTOMY    . TUBAL LIGATION       OB History    Gravida  6   Para  4   Term  4   Preterm      AB  2   Living        SAB  2   TAB      Ectopic      Multiple      Live Births               Home Medications    Prior to Admission medications   Medication Sig Start Date End Date Taking? Authorizing Provider  acetaminophen (TYLENOL) 500 MG tablet Take 500-1,000 mg by mouth  every 8 (eight) hours as needed for mild pain or headache.     [provider]  allopurinol (ZYLOPRIM) 100 MG tablet TAKE 1/2 TABLET BY MOUTH EVERY DAY 03/05/19   Claretta Fraise, MD  aspirin 81 MG tablet Take 81 mg by mouth daily.    [provider]  cetirizine (ZYRTEC) 10 MG tablet TAKE 1 TABLET BY MOUTH EVERY DAY 03/05/19   Claretta Fraise, MD  escitalopram (LEXAPRO) 5 MG tablet Take 1 tablet (5 mg total) by mouth daily for 14 days, THEN 2 tablets (10 mg total) daily for 14 days. Patient not taking: Reported on 12/30/2018 03/17/18 12/30/18  [name], [name]  famotidine (PEPCID) 40 MG tablet TAKE 1 TABLET BY MOUTH EVERY DAY 02/16/19   Kozlow, Donnamarie Poag, MD  Fluticasone Furoate (ARNUITY ELLIPTA) 200 MCG/ACT AEPB Inhale 1 puff into the lungs daily. 02/02/19   Kozlow, Donnamarie Poag, MD  ketoconazole (NIZORAL) 2 % cream Apply to the affected areas underneath breasts, belly  and in groin region ONCE daily x2-4 weeks. Patient taking differently: Apply 1 application topically See admin instructions. Apply to the affected areas underneath breasts, belly and in groin region ONCE daily for 2-4 weeks 09/16/18   Janora Norlander, DO  lamoTRIgine (LAMICTAL) 25 MG tablet Take 2 tablets (50 mg total) by mouth 2 (two) times daily. Patient taking differently: Take 25 mg by mouth 2 (two) times daily.  12/17/18   Kathrynn Ducking, MD  levETIRAcetam (KEPPRA XR) 500 MG 24 hr tablet Take 4 tablets (2,000 mg total) by mouth at bedtime. 11/10/18   Suzzanne Cloud, NP  metolazone (ZAROXOLYN) 2.5 MG tablet TAKE 1 TABLET BY MOUTH ON MONDAYS, WEDNESDAYS, AND FRIDAYS Patient taking differently: Take 2.5 mg by mouth every Monday, Wednesday, and Friday.  10/31/18   [name], [name]  metoprolol tartrate (LOPRESSOR) 25 MG tablet Take 0.5 tablets (12.5 mg total) by mouth 2 (two) times daily. 01/22/19   Hawks, Alyse Low A, [name]  mometasone (ELOCON) 0.1 % ointment Apply to skin once daily as directed after bathing. 03/04/18   Kozlow, Donnamarie Poag, MD  montelukast (SINGULAIR) 10 MG tablet TAKE 1 TABLET BY MOUTH AT BEDTIME 02/16/19   Kozlow, Donnamarie Poag, MD  Multiple Vitamins-Minerals (ONE-A-DAY WOMENS 50 PLUS) TABS Take 1 tablet by mouth daily with breakfast.    [provider]  nystatin-triamcinolone ointment (MYCOLOG) Apply 1 application topically 2 (two) times daily. 11/16/15   [name], [name]  omeprazole (PRILOSEC) 40 MG capsule Take 1 capsule (40 mg total) by mouth daily. Patient taking differently: Take 40 mg by mouth daily as needed (for reflux).  07/03/18   Kozlow, Donnamarie Poag, MD  Propylene Glycol (SYSTANE BALANCE OP) Place 1 drop into both eyes 2 (two) times daily.     [provider]  triamcinolone cream (KENALOG) 0.1 % Apply 1 application topically 2 (two) times daily. Patient taking differently: Apply 1 application topically 2 (two) times daily as needed (for eczema).  03/18/18   Kozlow,  Donnamarie Poag, MD  trimethoprim-polymyxin b (POLYTRIM) ophthalmic solution Place 1 drop into the right eye every 6 (six) hours. 12/18/18   [name], [name]    Family History Family History  Problem Relation Age of Onset  . Diabetes Mother   . Heart disease Mother   . Heart disease Father   . Cancer Sister   . Cancer Brother   . Cancer Sister     Social History Social History   Tobacco Use  .  Smoking status: Never Smoker  . Smokeless tobacco: Never Used  Substance Use Topics  . Alcohol use: No  . Drug use: No     Allergies   Advicor [niacin-lovastatin er], Caduet [amlodipine-atorvastatin], Codeine, Dilantin [phenytoin], Lescol [fluvastatin], Pamelor [nortriptyline], Penicillins, Sulfa antibiotics, Trileptal [oxcarbazepine], Ultram [tramadol], Welchol [colesevelam], Latex, and Tomato   Review of Systems Review of Systems  Gastrointestinal: Positive for diarrhea.  All other systems reviewed and are negative.    Physical Exam Updated Vital Signs BP 120/81 (BP Location: Right Arm)   Pulse (!) 122   Temp 98 F (36.7 C) (Oral)   Resp 20   Ht 5\' 4"  (1.626 m)   SpO2 96%   BMI 38.79 kg/m   Physical Exam Vitals signs and nursing note reviewed.  Constitutional:      Appearance: She is well-developed.  HENT:     Head: Normocephalic and atraumatic.  Cardiovascular:     Rate and Rhythm: Normal rate and regular rhythm.     Heart sounds: No murmur.  Pulmonary:     Effort: Pulmonary effort is normal. No respiratory distress.     Breath sounds: Normal breath sounds.  Abdominal:     Palpations: Abdomen is soft.     Tenderness: There is no abdominal tenderness. There is no guarding or rebound.  Musculoskeletal:        General: No tenderness.  Skin:    General: Skin is warm and dry.  Neurological:     Mental Status: She is alert.     Comments: Mildly confused, generalized weakness.   Psychiatric:     Comments: Anxious appearing      ED Treatments / Results  Labs  (all labs ordered are listed, but only abnormal results are displayed) Labs Reviewed  COMPREHENSIVE METABOLIC PANEL - Abnormal; Notable for the following components:      Result Value   Glucose, Bld 115 (*)    Creatinine, Ser 1.59 (*)    GFR calc non Af Amer 28 (*)    GFR calc Af Amer 32 (*)    All other components within normal limits  CBC WITH DIFFERENTIAL/PLATELET - Abnormal; Notable for the following components:   WBC 15.0 (*)    Hemoglobin 11.4 (*)    HCT 33.9 (*)    MCV 76.5 (*)    MCH 25.7 (*)    RDW 18.2 (*)    nRBC 0.5 (*)    Neutro Abs 11.5 (*)    Basophils Absolute 0.4 (*)    All other components within normal limits  URINALYSIS, ROUTINE W REFLEX MICROSCOPIC - Abnormal; Notable for the following components:   Color, Urine STRAW (*)    pH 9.0 (*)    Ketones, ur 5 (*)    All other components within normal limits  C DIFFICILE QUICK SCREEN W PCR REFLEX  SARS CORONAVIRUS 2 (TAT 6-24 HRS)  LIPASE, BLOOD  BASIC METABOLIC PANEL  CBC WITH DIFFERENTIAL/PLATELET  TROPONIN I (HIGH SENSITIVITY)  TROPONIN I (HIGH SENSITIVITY)    EKG EKG Interpretation  Date/Time:  Thursday March 12 2019 18:30:01 EST Ventricular Rate:  134 PR Interval:    QRS Duration: 130 QT Interval:  348 QTC Calculation: 520 R Axis:   87 Text Interpretation: Sinus tachycardia Right bundle branch block ST depr, consider ischemia, inferior leads Minimal ST elevation, lateral leads Confirmed by Quintella Reichert [phone]) on 03/12/2019 6:31:28 PM   Radiology Ct Head Wo Contrast  Result Date: 03/12/2019 CLINICAL DATA:  Persistent vertigo. EXAM: CT HEAD  WITHOUT CONTRAST TECHNIQUE: Contiguous axial images were obtained from the base of the skull through the vertex without intravenous contrast. COMPARISON:  10/11/2015. FINDINGS: Brain: No evidence of acute infarction, hemorrhage, hydrocephalus, extra-axial collection or mass lesion/mass effect. Mild periventricular white matter hypoattenuation is noted  consistent with chronic microvascular ischemic change. Vascular: No hyperdense vessel or unexpected calcification. Skull: Normal. Negative for fracture or focal lesion. Sinuses/Orbits: Globes and orbits are unremarkable. Sinuses, mastoid air cells and middle ear cavities are clear. Other: None. IMPRESSION: 1. No acute intracranial abnormalities. 2. Mild chronic microvascular ischemic change. Electronically Signed   By: Lajean Manes M.D.   On: 03/12/2019 19:45   Dg Chest Port 1 View  Result Date: 03/12/2019 CLINICAL DATA:  Chest pain vomiting EXAM: PORTABLE CHEST 1 VIEW COMPARISON:  March 28, 2018 FINDINGS: Heart size remains enlarged with aortic tortuosity. Lungs are clear. No acute bone finding. IMPRESSION: 1. No active cardiopulmonary disease. 2. Stable cardiomegaly. Electronically Signed   By: Zetta Bills M.D.   On: 03/12/2019 17:26   Ct Angio Chest/abd/pel For Dissection W And/or W/wo  Result Date: 03/12/2019 CLINICAL DATA:  Nausea vomiting for 3 days with chest pain EXAM: CT ANGIOGRAPHY CHEST, ABDOMEN AND PELVIS TECHNIQUE: Multidetector CT imaging through the chest, abdomen and pelvis was performed using the standard protocol during bolus administration of intravenous contrast. Multiplanar reconstructed images and MIPs were obtained and reviewed to evaluate the vascular anatomy. CONTRAST:  87mL OMNIPAQUE IOHEXOL 350 MG/ML SOLN COMPARISON:  None. FINDINGS: CTA CHEST FINDINGS Cardiovascular: Examination is somewhat limited secondary to patient motion and uncooperative nature. Initial precontrast images demonstrate no hyperdense crescent within the thoracic aorta. Post-contrast images begin at the level of the aortic arch. No aneurysmal dilatation or dissection is identified. The pulmonary artery shows no large central pulmonary embolus. No significant coronary calcifications are noted. No cardiac enlargement is seen. Mediastinum/Nodes: Small sliding-type hiatal hernia is noted. No hilar or  mediastinal adenopathy is noted. The thoracic inlet shows diffuse enlargement of the thyroid gland consistent with goiter. Lungs/Pleura: Lungs are well aerated bilaterally. No focal infiltrate is noted. 5 mm nodule is noted within the right upper lobe best seen on image number 13 of series 6. A few smaller scattered subpleural nodules are noted bilaterally. These all measure less than 5 mm. Musculoskeletal: Degenerative changes of the thoracic spine are noted. No acute rib abnormality is seen. The sternum is unremarkable. No compression deformities are seen. Review of the MIP images confirms the above findings. CTA ABDOMEN AND PELVIS FINDINGS VASCULAR Aorta: Atherosclerotic calcifications are noted without aneurysmal dilatation or dissection. Celiac: Patent without evidence of aneurysm, dissection, vasculitis or significant stenosis. SMA: Patent without evidence of aneurysm, dissection, vasculitis or significant stenosis. Renals: Both renal arteries are patent without evidence of aneurysm, dissection, vasculitis, fibromuscular dysplasia or significant stenosis. IMA: Patent without evidence of aneurysm, dissection, vasculitis or significant stenosis. Iliacs: Scattered atherosclerotic calcifications are noted without aneurysmal dilatation or dissection. The contrast bolus in the iliac vessels is somewhat limited due to patient difficulty with the scan. Veins: No specific vein abnormality is noted. Review of the MIP images confirms the above findings. NON-VASCULAR Hepatobiliary: No focal liver abnormality is seen. Status post cholecystectomy. No biliary dilatation. Pancreas: Unremarkable. No pancreatic ductal dilatation or surrounding inflammatory changes. Spleen: Normal in size without focal abnormality. Adrenals/Urinary Tract: Kidneys are well visualized bilaterally with multiple hypodensities consistent with cysts. The adrenal glands are within normal limits. No renal calculi or obstructive changes are noted. The  bladder is well  distended. Stomach/Bowel: Colon is decompressed. The appendix is not well visualized consistent with prior surgical history. No small bowel abnormality is noted. Hiatal hernia is again seen. Lymphatic: No enlarged abdominal or pelvic lymph nodes. Reproductive: Status post hysterectomy. No adnexal masses. Other: No abdominal wall hernia or abnormality. No abdominopelvic ascites. Musculoskeletal: Degenerative changes of lumbar spine are noted. Review of the MIP images confirms the above findings. IMPRESSION: No evidence of aortic dissection or aneurysmal dilatation. No definitive pulmonary emboli are seen. Scattered small nodules within both lungs measuring less than 5 mm. No follow-up needed if patient is low-risk (and has no known or suspected primary neoplasm). Non-contrast chest CT can be considered in 12 months if patient is high-risk. This recommendation follows the consensus statement: Guidelines for Management of Incidental Pulmonary Nodules Detected on CT Images: From the Fleischner Society 2017; Radiology 2017; 284:228-243. Hiatal hernia is seen. Renal cystic changes and diffuse goiter. Electronically Signed   By: Inez Catalina M.D.   On: 03/12/2019 20:10    Procedures Procedures (including critical care time)  Medications Ordered in ED Medications  famotidine (PEPCID) IVPB 20 mg premix (20 mg Intravenous New Bag/Given 03/12/19 2220)  alum & mag hydroxide-simeth (MAALOX/MYLANTA) 200-200-20 MG/5ML suspension 15 mL (has no administration in time range)  sodium chloride 0.9 % bolus 500 mL (500 mLs Intravenous Not Given 03/12/19 2155)  lamoTRIgine (LAMICTAL) tablet 25 mg (25 mg Oral Given 03/12/19 2225)  levETIRAcetam (KEPPRA XR) 24 hr tablet 2,000 mg (has no administration in time range)  metoprolol tartrate (LOPRESSOR) tablet 12.5 mg (has no administration in time range)  heparin injection 5,000 Units (5,000 Units Subcutaneous Given 03/12/19 2220)  sodium chloride flush (NS) 0.9 %  injection 3 mL (3 mLs Intravenous Given 03/12/19 2221)  0.9 %  sodium chloride infusion ( Intravenous New Bag/Given 03/12/19 2218)  acetaminophen (TYLENOL) tablet 650 mg (has no administration in time range)    Or  acetaminophen (TYLENOL) suppository 650 mg (has no administration in time range)  ondansetron (ZOFRAN) tablet 4 mg (has no administration in time range)    Or  ondansetron (ZOFRAN) injection 4 mg (has no administration in time range)  aspirin EC tablet 81 mg (81 mg Oral Given 03/12/19 2220)  meclizine (ANTIVERT) tablet 25 mg (has no administration in time range)  pantoprazole (PROTONIX) EC tablet 40 mg (40 mg Oral Given 03/12/19 2225)  sodium chloride 0.9 % bolus 500 mL (0 mLs Intravenous Stopped 03/12/19 2139)  fentaNYL (SUBLIMAZE) injection 25 mcg (25 mcg Intravenous Given 03/12/19 1849)  iohexol (OMNIPAQUE) 350 MG/ML injection 80 mL (80 mLs Intravenous Contrast Given 03/12/19 1933)  metoprolol tartrate (LOPRESSOR) injection 5 mg (5 mg Intravenous Given 03/12/19 2222)     Initial Impression / Assessment and Plan / ED Course  I have reviewed the triage vital signs and the nursing notes.  Pertinent labs & imaging results that were available during my care of the patient were reviewed by me and considered in my medical decision making (see chart for details).       Additional hx available after patient's initial ED arrival.  Per daughter pt with hx/o recurrent dizziness/swimmy headedness N/V.  Today she was dizzy and had trouble getting up out of bed.  Had soup around noon . Later this afternoon she developed v/d and pain in her chest and arms.  Daughter states that this has occurred before.    Patient here for evaluation of vomiting, diarrhea, chest pain. Patient complaining of severe chest pain on ED  arrival. EKG without acute ischemic changes. On repeat assessment patient tachycardic, writhing on the stretcher. She was treated with fentanyl and a CTA was obtained. CTA is  negative for dissection, PE, pneumonia. On repeat evaluation following the fentanyl patient appears to be hallucinating in the room responding to the things that are not present. Hospitalist consulted for admission for chest pain observation, hallucinations.   Final Clinical Impressions(s) / ED Diagnoses   Final diagnoses:  None    ED Discharge Orders    None       Quintella Reichert, MD 03/13/19 0013

## 2019-03-12 NOTE — ED Notes (Signed)
ED TO INPATIENT HANDOFF REPORT  ED Nurse Name and Phone #: [phone]  S Name/Age/Gender [name] 85 y.o. female Room/Bed: 021C/021C  Code Status   Code Status: Prior  Home/SNF/Other Home Patient oriented to: self, place, time and situation Is this baseline? Yes   Triage Complete: Triage complete  Chief Complaint vomitting  Triage Note Pt BIB [facility] from home w/ c/o N/V/D and chest pain. Per EMS, pt has had 3 episodes of vomiting. Pt received 8 mg Zofran and 500 mL NS per EMS. Pt is A&Ox4, nad noted.    Allergies Allergies  Allergen Reactions  . Advicor [Niacin-Lovastatin Er] Other (See Comments)    Reaction unknown to daughter  . Caduet [Amlodipine-Atorvastatin] Other (See Comments)    Reaction unknown to daughter  . Codeine Other (See Comments)    Reaction unknown to daughter  . Dilantin [Phenytoin] Other (See Comments)    Reaction unknown to daughter  . Lescol [Fluvastatin] Other (See Comments)    Reaction unknown to daughter  . Pamelor [Nortriptyline] Other (See Comments)    Reaction unknown to daughter  . Penicillins Hives    Did it involve swelling of the face/tongue/throat, SOB, or low BP? Unk Did it involve sudden or severe rash/hives, skin peeling, or any reaction on the inside of your mouth or nose? Unk Did you need to seek medical attention at a hospital or doctor's office? Unk When did it last happen? "it was a long time ago" If all above answers are "NO", may proceed with cephalosporin use.   . Sulfa Antibiotics Hives  . Trileptal [Oxcarbazepine] Other (See Comments)    Reaction unknown to daughter  . Ultram [Tramadol] Other (See Comments)    Reaction unknown to daughter  . Welchol [Colesevelam] Other (See Comments)    Reaction unknown to daughter  . Latex Rash  . Tomato Rash    Level of Care/Admitting Diagnosis ED Disposition    ED Disposition Condition [facility]: [place] [[id number]]  Level of  Care: Telemetry Cardiac [103]  I expect the patient will be discharged within 24 hours: Yes  LOW acuity---Tx typically complete <24 hrs---ACUTE conditions typically can be evaluated <24 hours---LABS likely to return to acceptable levels <24 hours---IS near functional baseline---EXPECTED to return to current living arrangement---NOT newly hypoxic: Does not meet criteria for 5C-Observation unit  Covid Evaluation: Asymptomatic Screening Protocol (No Symptoms)  Diagnosis: Nausea vomiting and diarrhea [283662]  Admitting Physician: Vianne Bulls [[id number]]  Attending Physician: Vianne Bulls [[id number]]  PT Class (Do Not Modify): Observation [104]  PT Acc Code (Do Not Modify): Observation [10022]       B Medical/Surgery History Past Medical History:  Diagnosis Date  . Chronic low back pain   . Degenerative arthritis   . Eczema   . Edema   . Gout   . Headache   . Hiatal hernia   . Hypertension   . Internal hemorrhoids   . Neurogenic claudication   . Obesity   . Seizure disorder ([place])   . Seizures ([place])   . Stroke ([place])   . Urticaria    Past Surgical History:  Procedure Laterality Date  . ABDOMINAL HYSTERECTOMY    . APPENDECTOMY    . APPENDECTOMY    . bilateral knees    . cataracts    . CHOLECYSTECTOMY    . mammoplasty reduction    . NEUROPLASTY / TRANSPOSITION MEDIAN NERVE AT CARPAL TUNNEL BILATERAL    .  RE-EXCISION OF BREAST LUMPECTOMY    . TUBAL LIGATION       A IV Location/Drains/Wounds Patient Lines/Drains/Airways Status   Active Line/Drains/Airways    Name:   Placement date:   Placement time:   Site:   Days:   Peripheral IV 03/12/19 Left Antecubital   03/12/19    1624    Antecubital   less than 1          Intake/Output Last 24 hours  Intake/Output Summary (Last 24 hours) at 03/12/2019 2139 Last data filed at 03/12/2019 2139 Gross per 24 hour  Intake 500 ml  Output 800 ml  Net -300 ml    Labs/Imaging Results for orders placed or performed during the  hospital encounter of 03/12/19 (from the past 48 hour(s))  Comprehensive metabolic panel     Status: Abnormal   Collection Time: 03/12/19  4:22 PM  Result Value Ref Range   Sodium 139 135 - 145 mmol/L   Potassium 4.0 3.5 - 5.1 mmol/L   Chloride 104 98 - 111 mmol/L   CO2 23 22 - 32 mmol/L   Glucose, Bld 115 (H) 70 - 99 mg/dL   BUN 17 8 - 23 mg/dL   Creatinine, Ser 1.59 (H) 0.44 - 1.00 mg/dL   Calcium 10.0 8.9 - 10.3 mg/dL   Total Protein 6.7 6.5 - 8.1 g/dL   Albumin 3.8 3.5 - 5.0 g/dL   AST 29 15 - 41 U/L   ALT 12 0 - 44 U/L   Alkaline Phosphatase 43 38 - 126 U/L   Total Bilirubin 0.9 0.3 - 1.2 mg/dL   GFR calc non Af Amer 28 (L) >60 mL/min   GFR calc Af Amer 32 (L) >60 mL/min   Anion gap 12 5 - 15    Comment: Performed at [facility], [address], [place], [place] [address]  Lipase, blood     Status: None   Collection Time: 03/12/19  4:22 PM  Result Value Ref Range   Lipase 42 11 - 51 U/L    Comment: Performed at [facility], [place] [address], [place], [place] [address]  Troponin I (High Sensitivity)     Status: None   Collection Time: 03/12/19  4:22 PM  Result Value Ref Range   Troponin I (High Sensitivity) 12 <18 ng/L    Comment: (NOTE) Elevated high sensitivity troponin I (hsTnI) values and significant  changes across serial measurements may suggest ACS but many other  chronic and acute conditions are known to elevate hsTnI results.  Refer to the "Links" section for chest pain algorithms and additional  guidance. Performed at [facility], [place] [address] [place], [place] [address]   CBC with Differential     Status: Abnormal   Collection Time: 03/12/19  4:22 PM  Result Value Ref Range   WBC 15.0 (H) 4.0 - 10.5 K/uL    Comment: WHITE COUNT CONFIRMED ON SMEAR   RBC 4.43 3.87 - 5.11 MIL/uL   Hemoglobin 11.4 (L) 12.0 - 15.0 g/dL   HCT 33.9 (L) 36.0 - 46.0 %   MCV 76.5 (L) 80.0 - 100.0 fL   MCH 25.7 (L) 26.0 - 34.0 pg   MCHC 33.6 30.0 - 36.0 g/dL    RDW 18.2 (H) 11.5 - 15.5 %   Platelets 249 150 - 400 K/uL    Comment: REPEATED TO VERIFY   nRBC 0.5 (H) 0.0 - 0.2 %   Neutrophils Relative % 75 %  Neutro Abs 11.5 (H) 1.7 - 7.7 K/uL   Lymphocytes Relative 18 %   Lymphs Abs 2.7 0.7 - 4.0 K/uL   Monocytes Relative 2 %   Monocytes Absolute 0.2 0.1 - 1.0 K/uL   Eosinophils Relative 1 %   Eosinophils Absolute 0.2 0.0 - 0.5 K/uL   Basophils Relative 3 %   Basophils Absolute 0.4 (H) 0.0 - 0.1 K/uL   Immature Granulocytes 1 %   Abs Immature Granulocytes 0.07 0.00 - 0.07 K/uL   Target Cells PRESENT     Comment: Performed at [facility] [address], [place], [place] [address]  Urinalysis, Routine w reflex microscopic     Status: Abnormal   Collection Time: 03/12/19  5:22 PM  Result Value Ref Range   Color, Urine STRAW (A) YELLOW   APPearance CLEAR CLEAR   Specific Gravity, Urine 1.005 1.005 - 1.030   pH 9.0 (H) 5.0 - 8.0   Glucose, UA NEGATIVE NEGATIVE mg/dL   Hgb urine dipstick NEGATIVE NEGATIVE   Bilirubin Urine NEGATIVE NEGATIVE   Ketones, ur 5 (A) NEGATIVE mg/dL   Protein, ur NEGATIVE NEGATIVE mg/dL   Nitrite NEGATIVE NEGATIVE   Leukocytes,Ua NEGATIVE NEGATIVE    Comment: Performed at [facility] [address] [place], [place] [address]  Troponin I (High Sensitivity)     Status: None   Collection Time: 03/12/19  7:56 PM  Result Value Ref Range   Troponin I (High Sensitivity) 17 <18 ng/L    Comment: (NOTE) Elevated high sensitivity troponin I (hsTnI) values and significant  changes across serial measurements may suggest ACS but many other  chronic and acute conditions are known to elevate hsTnI results.  Refer to the "Links" section for chest pain algorithms and additional  guidance. Performed at [facility], [place] [address], [place], [place] [address]    Ct Head Wo Contrast  Result Date: 03/12/2019 CLINICAL DATA:  Persistent vertigo. EXAM: CT HEAD WITHOUT CONTRAST TECHNIQUE: Contiguous axial  images were obtained from the base of the skull through the vertex without intravenous contrast. COMPARISON:  10/11/2015. FINDINGS: Brain: No evidence of acute infarction, hemorrhage, hydrocephalus, extra-axial collection or mass lesion/mass effect. Mild periventricular white matter hypoattenuation is noted consistent with chronic microvascular ischemic change. Vascular: No hyperdense vessel or unexpected calcification. Skull: Normal. Negative for fracture or focal lesion. Sinuses/Orbits: Globes and orbits are unremarkable. Sinuses, mastoid air cells and middle ear cavities are clear. Other: None. IMPRESSION: 1. No acute intracranial abnormalities. 2. Mild chronic microvascular ischemic change. Electronically Signed   By: Lajean Manes M.D.   On: 03/12/2019 19:45   Dg Chest Port 1 View  Result Date: 03/12/2019 CLINICAL DATA:  Chest pain vomiting EXAM: PORTABLE CHEST 1 VIEW COMPARISON:  March 28, 2018 FINDINGS: Heart size remains enlarged with aortic tortuosity. Lungs are clear. No acute bone finding. IMPRESSION: 1. No active cardiopulmonary disease. 2. Stable cardiomegaly. Electronically Signed   By: Zetta Bills M.D.   On: 03/12/2019 17:26   Ct Angio Chest/abd/pel For Dissection W And/or W/wo  Result Date: 03/12/2019 CLINICAL DATA:  Nausea vomiting for 3 days with chest pain EXAM: CT ANGIOGRAPHY CHEST, ABDOMEN AND PELVIS TECHNIQUE: Multidetector CT imaging through the chest, abdomen and pelvis was performed using the standard protocol during bolus administration of intravenous contrast. Multiplanar reconstructed images and MIPs were obtained and reviewed to evaluate the vascular anatomy. CONTRAST:  10mL OMNIPAQUE IOHEXOL 350 MG/ML SOLN COMPARISON:  None. FINDINGS: CTA CHEST FINDINGS Cardiovascular: Examination  is somewhat limited secondary to patient motion and uncooperative nature. Initial precontrast images demonstrate no hyperdense crescent within the thoracic aorta. Post-contrast images begin at  the level of the aortic arch. No aneurysmal dilatation or dissection is identified. The pulmonary artery shows no large central pulmonary embolus. No significant coronary calcifications are noted. No cardiac enlargement is seen. Mediastinum/Nodes: Small sliding-type hiatal hernia is noted. No hilar or mediastinal adenopathy is noted. The thoracic inlet shows diffuse enlargement of the thyroid gland consistent with goiter. Lungs/Pleura: Lungs are well aerated bilaterally. No focal infiltrate is noted. 5 mm nodule is noted within the right upper lobe best seen on image number 13 of series 6. A few smaller scattered subpleural nodules are noted bilaterally. These all measure less than 5 mm. Musculoskeletal: Degenerative changes of the thoracic spine are noted. No acute rib abnormality is seen. The sternum is unremarkable. No compression deformities are seen. Review of the MIP images confirms the above findings. CTA ABDOMEN AND PELVIS FINDINGS VASCULAR Aorta: Atherosclerotic calcifications are noted without aneurysmal dilatation or dissection. Celiac: Patent without evidence of aneurysm, dissection, vasculitis or significant stenosis. SMA: Patent without evidence of aneurysm, dissection, vasculitis or significant stenosis. Renals: Both renal arteries are patent without evidence of aneurysm, dissection, vasculitis, fibromuscular dysplasia or significant stenosis. IMA: Patent without evidence of aneurysm, dissection, vasculitis or significant stenosis. Iliacs: Scattered atherosclerotic calcifications are noted without aneurysmal dilatation or dissection. The contrast bolus in the iliac vessels is somewhat limited due to patient difficulty with the scan. Veins: No specific vein abnormality is noted. Review of the MIP images confirms the above findings. NON-VASCULAR Hepatobiliary: No focal liver abnormality is seen. Status post cholecystectomy. No biliary dilatation. Pancreas: Unremarkable. No pancreatic ductal dilatation  or surrounding inflammatory changes. Spleen: Normal in size without focal abnormality. Adrenals/Urinary Tract: Kidneys are well visualized bilaterally with multiple hypodensities consistent with cysts. The adrenal glands are within normal limits. No renal calculi or obstructive changes are noted. The bladder is well distended. Stomach/Bowel: Colon is decompressed. The appendix is not well visualized consistent with prior surgical history. No small bowel abnormality is noted. Hiatal hernia is again seen. Lymphatic: No enlarged abdominal or pelvic lymph nodes. Reproductive: Status post hysterectomy. No adnexal masses. Other: No abdominal wall hernia or abnormality. No abdominopelvic ascites. Musculoskeletal: Degenerative changes of lumbar spine are noted. Review of the MIP images confirms the above findings. IMPRESSION: No evidence of aortic dissection or aneurysmal dilatation. No definitive pulmonary emboli are seen. Scattered small nodules within both lungs measuring less than 5 mm. No follow-up needed if patient is low-risk (and has no known or suspected primary neoplasm). Non-contrast chest CT can be considered in 12 months if patient is high-risk. This recommendation follows the consensus statement: Guidelines for Management of Incidental Pulmonary Nodules Detected on CT Images: From the Fleischner Society 2017; Radiology 2017; 284:228-243. Hiatal hernia is seen. Renal cystic changes and diffuse goiter. Electronically Signed   By: Inez Catalina M.D.   On: 03/12/2019 20:10    Pending Labs Unresulted Labs (From admission, onward)    Start     Ordered   03/12/19 1915  SARS CORONAVIRUS 2 (TAT 6-24 HRS) Nasopharyngeal Nasopharyngeal Swab  (Asymptomatic/Tier 3)  Once,   STAT    Question Answer Comment  Is this test for diagnosis or screening Screening   Symptomatic for COVID-19 as defined by CDC No   Hospitalized for COVID-19 No   Admitted to ICU for COVID-19 No   Previously tested for COVID-19 Yes  Resident in a congregate (group) care setting No   Employed in healthcare setting No   Pregnant No      03/12/19 1915   03/12/19 1829  C difficile quick scan w PCR reflex  (C Difficile quick screen w PCR reflex panel)  Once, for 24 hours,   STAT     03/12/19 1829   Signed and Held  Basic metabolic panel  Tomorrow morning,   R     Signed and Held   Signed and Held  CBC WITH DIFFERENTIAL  Tomorrow morning,   R     Signed and Held          Vitals/Pain Today's Vitals   03/12/19 1830 03/12/19 1943 03/12/19 1957 03/12/19 2030  BP: (!) 154/82 (!) 149/67  139/82  Pulse: (!) 135 (!) 118  (!) 124  Resp: 20 (!) 22  (!) 21  Temp:      TempSrc:      SpO2: 99% 100%  97%  Height:      PainSc:   5      Isolation Precautions Enteric precautions (UV disinfection)  Medications Medications  metoprolol tartrate (LOPRESSOR) injection 5 mg (has no administration in time range)  famotidine (PEPCID) IVPB 20 mg premix (has no administration in time range)  alum & mag hydroxide-simeth (MAALOX/MYLANTA) 200-200-20 MG/5ML suspension 15 mL (has no administration in time range)  sodium chloride 0.9 % bolus 500 mL (has no administration in time range)  sodium chloride 0.9 % bolus 500 mL (0 mLs Intravenous Stopped 03/12/19 2139)  fentaNYL (SUBLIMAZE) injection 25 mcg (25 mcg Intravenous Given 03/12/19 1849)  iohexol (OMNIPAQUE) 350 MG/ML injection 80 mL (80 mLs Intravenous Contrast Given 03/12/19 1933)    Mobility  Moderate fall risk   Focused Assessments Cardiac Assessment Handoff:  Cardiac Rhythm: Sinus tachycardia Lab Results  Component Value Date   TROPONINI <0.03 11/14/2016   Lab Results  Component Value Date   DDIMER 3.37 (H) 12/30/2018   Does the Patient currently have chest pain? No - c/o ear pain     R Recommendations: See Admitting Provider Note  Report given to:   Additional Notes: -

## 2019-03-13 ENCOUNTER — Observation Stay (HOSPITAL_COMMUNITY): Payer: Medicare Other

## 2019-03-13 DIAGNOSIS — R918 Other nonspecific abnormal finding of lung field: Secondary | ICD-10-CM

## 2019-03-13 DIAGNOSIS — R112 Nausea with vomiting, unspecified: Secondary | ICD-10-CM

## 2019-03-13 DIAGNOSIS — R197 Diarrhea, unspecified: Secondary | ICD-10-CM

## 2019-03-13 DIAGNOSIS — Z87898 Personal history of other specified conditions: Secondary | ICD-10-CM

## 2019-03-13 DIAGNOSIS — I10 Essential (primary) hypertension: Secondary | ICD-10-CM

## 2019-03-13 DIAGNOSIS — D631 Anemia in chronic kidney disease: Secondary | ICD-10-CM

## 2019-03-13 DIAGNOSIS — N184 Chronic kidney disease, stage 4 (severe): Secondary | ICD-10-CM

## 2019-03-13 DIAGNOSIS — N189 Chronic kidney disease, unspecified: Secondary | ICD-10-CM

## 2019-03-13 DIAGNOSIS — R079 Chest pain, unspecified: Secondary | ICD-10-CM

## 2019-03-13 DIAGNOSIS — Z8673 Personal history of transient ischemic attack (TIA), and cerebral infarction without residual deficits: Secondary | ICD-10-CM

## 2019-03-13 DIAGNOSIS — F411 Generalized anxiety disorder: Secondary | ICD-10-CM

## 2019-03-13 LAB — CBC WITH DIFFERENTIAL/PLATELET
Abs Immature Granulocytes: 0.05 10*3/uL (ref 0.00–0.07)
Basophils Absolute: 0.3 10*3/uL — ABNORMAL HIGH (ref 0.0–0.1)
Basophils Relative: 2 %
Eosinophils Absolute: 0.1 10*3/uL (ref 0.0–0.5)
Eosinophils Relative: 1 %
HCT: 32.6 % — ABNORMAL LOW (ref 36.0–46.0)
Hemoglobin: 10.7 g/dL — ABNORMAL LOW (ref 12.0–15.0)
Immature Granulocytes: 0 %
Lymphocytes Relative: 20 %
Lymphs Abs: 2.7 10*3/uL (ref 0.7–4.0)
MCH: 25.2 pg — ABNORMAL LOW (ref 26.0–34.0)
MCHC: 32.8 g/dL (ref 30.0–36.0)
MCV: 76.7 fL — ABNORMAL LOW (ref 80.0–100.0)
Monocytes Absolute: 0.2 10*3/uL (ref 0.1–1.0)
Monocytes Relative: 2 %
Neutro Abs: 10 10*3/uL — ABNORMAL HIGH (ref 1.7–7.7)
Neutrophils Relative %: 75 %
Platelets: 247 10*3/uL (ref 150–400)
RBC: 4.25 MIL/uL (ref 3.87–5.11)
RDW: 18.4 % — ABNORMAL HIGH (ref 11.5–15.5)
WBC: 13.5 10*3/uL — ABNORMAL HIGH (ref 4.0–10.5)
nRBC: 0.8 % — ABNORMAL HIGH (ref 0.0–0.2)

## 2019-03-13 LAB — BASIC METABOLIC PANEL
Anion gap: 12 (ref 5–15)
BUN: 16 mg/dL (ref 8–23)
CO2: 26 mmol/L (ref 22–32)
Calcium: 9.8 mg/dL (ref 8.9–10.3)
Chloride: 105 mmol/L (ref 98–111)
Creatinine, Ser: 1.56 mg/dL — ABNORMAL HIGH (ref 0.44–1.00)
GFR calc Af Amer: 33 mL/min — ABNORMAL LOW (ref >60)
GFR calc non Af Amer: 28 mL/min — ABNORMAL LOW (ref >60)
Glucose, Bld: 113 mg/dL — ABNORMAL HIGH (ref 70–99)
Potassium: 3.5 mmol/L (ref 3.5–5.1)
Sodium: 143 mmol/L (ref 135–145)

## 2019-03-13 LAB — SARS CORONAVIRUS 2 (TAT 6-24 HRS): SARS Coronavirus 2: NEGATIVE

## 2019-03-13 MED ORDER — FAMOTIDINE 20 MG PO TABS: 20.0000 mg | ORAL_TABLET | Freq: Every day | ORAL | Status: DC

## 2019-03-13 NOTE — Discharge Summary (Signed)
Discharge Summary  [name] UVO:[id number] DOB: [birth year]  PCP: [name], [name]  Admit date: 03/12/2019 Discharge date: 03/13/2019  Time spent: 30 mins  Recommendations for Outpatient Follow-up:  1. PCP in 1 week 2. Neurology as scheduled on 03/16/19   Discharge Diagnoses:  Active Hospital Problems   Diagnosis Date Noted   Nausea vomiting and diarrhea 03/12/2019   Lung nodules 03/12/2019   History of stroke 11/13/2016   History of seizures 11/13/2016   Chest pain 11/13/2016   GAD (generalized anxiety disorder) 08/30/2016   Essential hypertension, benign 09/30/2014   Chronic kidney disease (CKD), stage IV (severe) ([facility]) 09/30/2014   Anemia associated with chronic renal failure 09/30/2014    Resolved Hospital Problems  No resolved problems to display.    Discharge Condition: Stable  Diet recommendation: As tolerated  Vitals:   03/13/19 0813 03/13/19 1148  BP: 121/63 126/65  Pulse: 89 61  Resp: 16 (!) 22  Temp: 99.8 F (37.7 C) 98.5 F (36.9 C)  SpO2: 96% 96%    History of present illness:  [name] is a 85 y.o. female with medical history significant for history of CVA, seizure disorder, hypertension, chronic kidney disease stage IV, and history of mild confusion, now presenting to the emergency department for evaluation of nausea, vomiting, diarrhea, and vertigo.  Patient reports that she had been constipated for about 2 weeks, was taking a medication at home for this that she does not know the name of, felt generally unwell, was experiencing a sensation as though the room was spinning with nausea, and went on to develop nonbloody vomiting and loose stools.  She did not feel well enough to get out of bed and family eventually called EMS.  Patient also describes some chest discomfort that she reports to be "indigestion."  She also reports some shoulder discomfort that she attributes to a recent fall.  She denies any headache, change in  hearing, focal numbness or weakness, but reports blurred vision involving the right eye for several days.  She denies fevers, chills, or cough. In the ED, patient is found to be afebrile, saturating well on room air, mildly tachypneic, tachycardic in the 130s, and with stable blood pressure.  EKG features sinus tachycardia with rate 134, RBBB, and inferior ST depressions.  Noncontrast head CT is negative for acute intracranial abnormality.  CTA chest/abdomen/pelvis is negative for aortic dissection or aneurysm, negative for PE, but notable for scattered pulmonary nodules bilaterally.  Chemistry panel features a creatinine 1.59, consistent with her apparent baseline.  CBC is notable for leukocytosis to 15,000 and a stable mild microcytic anemia.  High-sensitivity troponin is normal x2.  Urinalysis notable for an elevated pH and ketonuria.  Patient was treated with 500 cc normal saline and fentanyl in the emergency department.  COVID-19 screening test negative.    Today, patient denies any new complaints, met patient in room watching TV, relaxing, looks comfortable.  Noted to be pretty talkative, denies any chest pain, abdominal pain, nausea/vomiting/diarrhea/dizziness, fever/chills.  Discussed with daughter over the phone, recommend patient follow-up with PCP in 1 week with repeat labs.  Patient also already has a scheduled appointment with her neurologist on 03/16/2019.  Hospital Course:  Principal Problem:   Nausea vomiting and diarrhea Active Problems:   Essential hypertension, benign   Anemia associated with chronic renal failure   Chronic kidney disease (CKD), stage IV (severe) ([facility])   GAD (generalized anxiety disorder)   Chest pain   History of seizures  History of stroke   Lung nodules  Nausea, vomiting, diarrhea Currently resolved Patient reports history of constipation, and has been taking some sort of laxatives  CT abdomen unremarkable Status post IV fluids Follow-up with  PCP  Likely non-cardiac chest pain Currently chest pain-free  HS troponin is normal x2, EKG with sinus tachycardia and non-specific ST abnormality CTA c/a/p is negative for acute aortic findings or PE  Continue H2-blocker and PPI, ASA and beta-blocker  Follow-up with PCP  Vertigo Currently resolved  CT head negative Patient unable to lay down flat due to back and neck pain to have an MRI done Continue supportive care, follow-up with PCP   Seizures  Patient had a breakthrough seizure last week per daughter and is scheduled to see her neurologist, Dr. Jannifer Franklin, on 03/16/19   Continue Lamictal and Keppra   CKD IV  Creatinine stable at baseline Follow-up with PCP  Lung nodules  Noted incidentally on CT in ED  Outpatient follow-up recommended         Malnutrition Type:      Malnutrition Characteristics:      Nutrition Interventions:      Estimated body mass index is 42.91 kg/m as calculated from the following:   Height as of this encounter: 5' (1.524 m).   Weight as of this encounter: 99.7 kg.    Procedures:  None  Consultations:  None  Discharge Exam: BP 126/65 (BP Location: Left Arm)    Pulse 61    Temp 98.5 F (36.9 C) (Oral)    Resp (!) 22    Ht 5' (1.524 m)    Wt 99.7 kg    SpO2 96%    BMI 42.91 kg/m   General: NAD Cardiovascular: S1, S2 present Respiratory: CTA B Neurology: No obvious focal neurologic deficits noted, strength equal in all extremities  Discharge Instructions You were cared for by a hospitalist during your hospital stay. If you have any questions about your discharge medications or the care you received while you were in the hospital after you are discharged, you can call the unit and asked to speak with the hospitalist on call if the hospitalist that took care of you is not available. Once you are discharged, your primary care physician will handle any further medical issues. Please note that NO REFILLS for any discharge  medications will be authorized once you are discharged, as it is imperative that you return to your primary care physician (or establish a relationship with a primary care physician if you do not have one) for your aftercare needs so that they can reassess your need for medications and monitor your lab values.  Discharge Instructions    Diet - low sodium heart healthy   Complete by: As directed    Increase activity slowly   Complete by: As directed      Allergies as of 03/13/2019      Reactions   Advicor [niacin-lovastatin Er] Other (See Comments)   Reaction unknown to daughter   Caduet [amlodipine-atorvastatin] Other (See Comments)   Reaction unknown to daughter   Codeine Other (See Comments)   Reaction unknown to daughter   Dilantin [phenytoin] Other (See Comments)   Reaction unknown to daughter   Lescol [fluvastatin] Other (See Comments)   Reaction unknown to daughter   Pamelor [nortriptyline] Other (See Comments)   Reaction unknown to daughter   Penicillins Hives   Did it involve swelling of the face/tongue/throat, SOB, or low BP? Unk Did it involve sudden or  severe rash/hives, skin peeling, or any reaction on the inside of your mouth or nose? Unk Did you need to seek medical attention at a hospital or doctor's office? Unk When did it last happen? "it was a long time ago" If all above answers are NO, may proceed with cephalosporin use.   Sulfa Antibiotics Hives   Trileptal [oxcarbazepine] Other (See Comments)   Reaction unknown to daughter   Ultram [tramadol] Other (See Comments)   Reaction unknown to daughter   Welchol [colesevelam] Other (See Comments)   Reaction unknown to daughter   Latex Rash   Tomato Rash      Medication List    STOP taking these medications   escitalopram 5 MG tablet Commonly known as: LEXAPRO     TAKE these medications   acetaminophen 500 MG tablet Commonly known as: TYLENOL Take 500-1,000 mg by mouth every 8 (eight) hours as needed for  mild pain or headache.   allopurinol 100 MG tablet Commonly known as: ZYLOPRIM TAKE 1/2 TABLET BY MOUTH EVERY DAY   Arnuity Ellipta 200 MCG/ACT Aepb Generic drug: Fluticasone Furoate Inhale 1 puff into the lungs daily.   aspirin 81 MG tablet Take 81 mg by mouth daily.   cetirizine 10 MG tablet Commonly known as: ZYRTEC TAKE 1 TABLET BY MOUTH EVERY DAY   famotidine 40 MG tablet Commonly known as: PEPCID TAKE 1 TABLET BY MOUTH EVERY DAY   ketoconazole 2 % cream Commonly known as: NIZORAL Apply to the affected areas underneath breasts, belly and in groin region ONCE daily x2-4 weeks. What changed:   how much to take  how to take this  when to take this  additional instructions   lamoTRIgine 25 MG tablet Commonly known as: LAMICTAL Take 2 tablets (50 mg total) by mouth 2 (two) times daily. What changed: how much to take   levETIRAcetam 500 MG 24 hr tablet Commonly known as: KEPPRA XR Take 4 tablets (2,000 mg total) by mouth at bedtime.   metolazone 2.5 MG tablet Commonly known as: ZAROXOLYN TAKE 1 TABLET BY MOUTH ON MONDAYS, WEDNESDAYS, AND FRIDAYS What changed: See the new instructions.   metoprolol tartrate 25 MG tablet Commonly known as: LOPRESSOR Take 0.5 tablets (12.5 mg total) by mouth 2 (two) times daily.   mometasone 0.1 % ointment Commonly known as: ELOCON Apply to skin once daily as directed after bathing.   montelukast 10 MG tablet Commonly known as: SINGULAIR TAKE 1 TABLET BY MOUTH AT BEDTIME   nystatin-triamcinolone ointment Commonly known as: MYCOLOG Apply 1 application topically 2 (two) times daily.   omeprazole 40 MG capsule Commonly known as: PriLOSEC Take 1 capsule (40 mg total) by mouth daily. What changed:   when to take this  reasons to take this   One-A-Day Womens 50 Plus Tabs Take 1 tablet by mouth daily with breakfast.   SYSTANE BALANCE OP Place 1 drop into both eyes 2 (two) times daily.   triamcinolone cream 0.1  % Commonly known as: KENALOG Apply 1 application topically 2 (two) times daily. What changed:   when to take this  reasons to take this   trimethoprim-polymyxin b ophthalmic solution Commonly known as: Polytrim Place 1 drop into the right eye every 6 (six) hours.      Allergies  Allergen Reactions   Advicor [Niacin-Lovastatin Er] Other (See Comments)    Reaction unknown to daughter   Caduet [Amlodipine-Atorvastatin] Other (See Comments)    Reaction unknown to daughter   Codeine Other (See  Comments)    Reaction unknown to daughter   Dilantin [Phenytoin] Other (See Comments)    Reaction unknown to daughter   Lescol [Fluvastatin] Other (See Comments)    Reaction unknown to daughter   Pamelor [Nortriptyline] Other (See Comments)    Reaction unknown to daughter   Penicillins Hives    Did it involve swelling of the face/tongue/throat, SOB, or low BP? Unk Did it involve sudden or severe rash/hives, skin peeling, or any reaction on the inside of your mouth or nose? Unk Did you need to seek medical attention at a hospital or doctor's office? Unk When did it last happen? "it was a long time ago" If all above answers are NO, may proceed with cephalosporin use.    Sulfa Antibiotics Hives   Trileptal [Oxcarbazepine] Other (See Comments)    Reaction unknown to daughter   Ultram [Tramadol] Other (See Comments)    Reaction unknown to daughter   Welchol [Colesevelam] Other (See Comments)    Reaction unknown to daughter   Latex Rash   Tomato Rash   Follow-up Information    [name], [name]. Schedule an appointment as soon as possible for a visit in 1 week(s).   Specialty: Family Medicine Contact information: [place] [address] [phone]            The results of significant diagnostics from this hospitalization (including imaging, microbiology, ancillary and laboratory) are listed below for reference.    Significant Diagnostic  Studies: Ct Head Wo Contrast  Result Date: 03/12/2019 CLINICAL DATA:  Persistent vertigo. EXAM: CT HEAD WITHOUT CONTRAST TECHNIQUE: Contiguous axial images were obtained from the base of the skull through the vertex without intravenous contrast. COMPARISON:  10/11/2015. FINDINGS: Brain: No evidence of acute infarction, hemorrhage, hydrocephalus, extra-axial collection or mass lesion/mass effect. Mild periventricular white matter hypoattenuation is noted consistent with chronic microvascular ischemic change. Vascular: No hyperdense vessel or unexpected calcification. Skull: Normal. Negative for fracture or focal lesion. Sinuses/Orbits: Globes and orbits are unremarkable. Sinuses, mastoid air cells and middle ear cavities are clear. Other: None. IMPRESSION: 1. No acute intracranial abnormalities. 2. Mild chronic microvascular ischemic change. Electronically Signed   By: Lajean Manes M.D.   On: 03/12/2019 19:45   Mr Brain Wo Contrast  Result Date: 03/13/2019 CLINICAL DATA:  Vertigo. EXAM: MRI HEAD WITHOUT CONTRAST TECHNIQUE: Multiplanar, multiecho pulse sequences of the brain and surrounding structures were obtained without intravenous contrast. COMPARISON:  Head CT 03/12/2019 FINDINGS: The patient could not tolerate the examination, and only axial diffusion-weighted imaging was obtained. There is no acute infarct. Cerebral volume is within normal limits for age. T2 hyperintensities in the cerebral white matter and pons are incompletely evaluated and nonspecific though likely reflect mild-to-moderate chronic small vessel ischemic disease. There is a chronic lacunar infarct in the left caudate nucleus. No intracranial mass effect or sizable extra-axial fluid collection is evident. IMPRESSION: 1. Incomplete, diffusion only examination.  No acute infarct. 2. Mild-to-moderate chronic small vessel ischemic disease. Electronically Signed   By: Logan Bores M.D.   On: 03/13/2019 05:12   Dg Chest Port 1  View  Result Date: 03/12/2019 CLINICAL DATA:  Chest pain vomiting EXAM: PORTABLE CHEST 1 VIEW COMPARISON:  March 28, 2018 FINDINGS: Heart size remains enlarged with aortic tortuosity. Lungs are clear. No acute bone finding. IMPRESSION: 1. No active cardiopulmonary disease. 2. Stable cardiomegaly. Electronically Signed   By: Zetta Bills M.D.   On: 03/12/2019 17:26   Ct Angio Chest/abd/pel For  Dissection W And/or W/wo  Result Date: 03/12/2019 CLINICAL DATA:  Nausea vomiting for 3 days with chest pain EXAM: CT ANGIOGRAPHY CHEST, ABDOMEN AND PELVIS TECHNIQUE: Multidetector CT imaging through the chest, abdomen and pelvis was performed using the standard protocol during bolus administration of intravenous contrast. Multiplanar reconstructed images and MIPs were obtained and reviewed to evaluate the vascular anatomy. CONTRAST:  57m OMNIPAQUE IOHEXOL 350 MG/ML SOLN COMPARISON:  None. FINDINGS: CTA CHEST FINDINGS Cardiovascular: Examination is somewhat limited secondary to patient motion and uncooperative nature. Initial precontrast images demonstrate no hyperdense crescent within the thoracic aorta. Post-contrast images begin at the level of the aortic arch. No aneurysmal dilatation or dissection is identified. The pulmonary artery shows no large central pulmonary embolus. No significant coronary calcifications are noted. No cardiac enlargement is seen. Mediastinum/Nodes: Small sliding-type hiatal hernia is noted. No hilar or mediastinal adenopathy is noted. The thoracic inlet shows diffuse enlargement of the thyroid gland consistent with goiter. Lungs/Pleura: Lungs are well aerated bilaterally. No focal infiltrate is noted. 5 mm nodule is noted within the right upper lobe best seen on image number 13 of series 6. A few smaller scattered subpleural nodules are noted bilaterally. These all measure less than 5 mm. Musculoskeletal: Degenerative changes of the thoracic spine are noted. No acute rib abnormality  is seen. The sternum is unremarkable. No compression deformities are seen. Review of the MIP images confirms the above findings. CTA ABDOMEN AND PELVIS FINDINGS VASCULAR Aorta: Atherosclerotic calcifications are noted without aneurysmal dilatation or dissection. Celiac: Patent without evidence of aneurysm, dissection, vasculitis or significant stenosis. SMA: Patent without evidence of aneurysm, dissection, vasculitis or significant stenosis. Renals: Both renal arteries are patent without evidence of aneurysm, dissection, vasculitis, fibromuscular dysplasia or significant stenosis. IMA: Patent without evidence of aneurysm, dissection, vasculitis or significant stenosis. Iliacs: Scattered atherosclerotic calcifications are noted without aneurysmal dilatation or dissection. The contrast bolus in the iliac vessels is somewhat limited due to patient difficulty with the scan. Veins: No specific vein abnormality is noted. Review of the MIP images confirms the above findings. NON-VASCULAR Hepatobiliary: No focal liver abnormality is seen. Status post cholecystectomy. No biliary dilatation. Pancreas: Unremarkable. No pancreatic ductal dilatation or surrounding inflammatory changes. Spleen: Normal in size without focal abnormality. Adrenals/Urinary Tract: Kidneys are well visualized bilaterally with multiple hypodensities consistent with cysts. The adrenal glands are within normal limits. No renal calculi or obstructive changes are noted. The bladder is well distended. Stomach/Bowel: Colon is decompressed. The appendix is not well visualized consistent with prior surgical history. No small bowel abnormality is noted. Hiatal hernia is again seen. Lymphatic: No enlarged abdominal or pelvic lymph nodes. Reproductive: Status post hysterectomy. No adnexal masses. Other: No abdominal wall hernia or abnormality. No abdominopelvic ascites. Musculoskeletal: Degenerative changes of lumbar spine are noted. Review of the MIP images  confirms the above findings. IMPRESSION: No evidence of aortic dissection or aneurysmal dilatation. No definitive pulmonary emboli are seen. Scattered small nodules within both lungs measuring less than 5 mm. No follow-up needed if patient is low-risk (and has no known or suspected primary neoplasm). Non-contrast chest CT can be considered in 12 months if patient is high-risk. This recommendation follows the consensus statement: Guidelines for Management of Incidental Pulmonary Nodules Detected on CT Images: From the Fleischner Society 2017; Radiology 2017; 284:228-243. Hiatal hernia is seen. Renal cystic changes and diffuse goiter. Electronically Signed   By: MInez CatalinaM.D.   On: 03/12/2019 20:10    Microbiology: Recent Results (from the past 240  hour(s))  SARS CORONAVIRUS 2 (TAT 6-24 HRS) Nasopharyngeal Nasopharyngeal Swab     Status: None   Collection Time: 03/12/19  7:15 PM   Specimen: Nasopharyngeal Swab  Result Value Ref Range Status   SARS Coronavirus 2 NEGATIVE NEGATIVE Final    Comment: (NOTE) SARS-CoV-2 target nucleic acids are NOT DETECTED. The SARS-CoV-2 RNA is generally detectable in upper and lower respiratory specimens during the acute phase of infection. Negative results do not preclude SARS-CoV-2 infection, do not rule out co-infections with other pathogens, and should not be used as the sole basis for treatment or other patient management decisions. Negative results must be combined with clinical observations, patient history, and epidemiological information. The expected result is Negative. Fact Sheet for Patients: SugarRoll.be Fact Sheet for Healthcare Providers: https://www.woods-mathews.com/ This test is not yet approved or cleared by the [place] FDA and  has been authorized for detection and/or diagnosis of SARS-CoV-2 by FDA under an Emergency Use Authorization (EUA). This EUA will remain  in effect (meaning this test  can be used) for the duration of the COVID-19 declaration under Section 56 4(b)(1) of the Act, 21 U.S.C. section 360bbb-3(b)(1), unless the authorization is terminated or revoked sooner. Performed at [facility], [place] [address], [place], [place] [address]      Labs: Basic Metabolic Panel: Recent Labs  Lab 03/12/19 1622 03/13/19 0514  NA 139 143  K 4.0 3.5  CL 104 105  CO2 23 26  GLUCOSE 115* 113*  BUN 17 16  CREATININE 1.59* 1.56*  CALCIUM 10.0 9.8   Liver Function Tests: Recent Labs  Lab 03/12/19 1622  AST 29  ALT 12  ALKPHOS 43  BILITOT 0.9  PROT 6.7  ALBUMIN 3.8   Recent Labs  Lab 03/12/19 1622  LIPASE 42   No results for input(s): AMMONIA in the last 168 hours. CBC: Recent Labs  Lab 03/12/19 1622 03/13/19 0514  WBC 15.0* 13.5*  NEUTROABS 11.5* 10.0*  HGB 11.4* 10.7*  HCT 33.9* 32.6*  MCV 76.5* 76.7*  PLT 249 247   Cardiac Enzymes: No results for input(s): CKTOTAL, CKMB, CKMBINDEX, TROPONINI in the last 168 hours. BNP: BNP (last 3 results) No results for input(s): BNP in the last 8760 hours.  ProBNP (last 3 results) No results for input(s): PROBNP in the last 8760 hours.  CBG: No results for input(s): GLUCAP in the last 168 hours.     Signed:  Alma Friendly, MD [facility] 03/13/2019, 1:02 PM

## 2019-03-13 NOTE — Progress Notes (Signed)
Pt went for MR Head/Neck/Brain. Pt couldn't tolerate laying flat.

## 2019-03-13 NOTE — Progress Notes (Signed)
Call placed to CCMD to notify of telemetry monitoring d/c.   

## 2019-03-13 NOTE — Progress Notes (Signed)
Spoke with pt at bedside regarding [place] orders- list provided Per CMS guidelines from medicare.gov website with star ratings (copy placed in shadow chart)- pt mainly concerned with someone to assist with household needs and staying with her at night- explained that this was not covered under skilled [facility] and more private duty assistance. Daughter [name] to arrive at 3pm- CM will return to speak with pt and daughter about transition of care needs and [facility] choice.

## 2019-03-13 NOTE — TOC Initial Note (Signed)
Transition of Care [facility]) - Initial/Assessment Note    Patient Details  Name: [name] MRN: [id number] Date of Birth: 1923-11-05  Transition of Care [facility]) CM/SW Contact:    Alberteen Sam, [place] Phone Number: [phone] 03/13/2019, 3:46 PM  Clinical Narrative:                  RNCM spoke with patient at bedside at 1pm, [place] agency list given to patient who was awaiting daughter [name] arrival at [place] followed up with patient who was already discharged. CSW has ensured patient is set up with [place], OT, RN, aide and social work services though [facility]. Lvm with daughter [name] letting her know that [facility] will be reaching out to schedule appointment days and times.   Expected Discharge Plan: [place] Barriers to Discharge: No Barriers Identified   Patient Goals and CMS Choice Patient states their goals for this hospitalization and ongoing recovery are:: to go home CMS Medicare.gov Compare Post Acute Care list provided to:: Patient Choice offered to / list presented to : Patient  Expected Discharge Plan and Services Expected Discharge Plan: [place] Choice: [facility] arrangements for the past 2 months: Single Family Home Expected Discharge Date: 03/13/19                         HH Arranged: Nurse's Aide, Social Work, Therapist, sports, OT, PT [place] Agency: [facility] [place] Date [place]: 03/13/19 Time [place]: 54 Representative spoke with at [place] Arrangements/Services Living arrangements for the past 2 months: [place] with:: Adult Children(daughter [name] as support) Patient language and need for interpreter reviewed:: Yes Do you feel safe going back to the place where you live?: Yes      Need for Family Participation in Patient Care: Yes (Comment) Care giver support system in place?: Yes (comment)    Criminal Activity/Legal Involvement Pertinent to Current Situation/Hospitalization: No - Comment as needed  Activities of Daily Living Home Assistive Devices/Equipment: Walker (specify type) ADL Screening (condition at time of admission) Patient's cognitive ability adequate to safely complete daily activities?: Yes Is the patient deaf or have difficulty hearing?: No Does the patient have difficulty seeing, even when wearing glasses/contacts?: Yes Does the patient have difficulty concentrating, remembering, or making decisions?: No Patient able to express need for assistance with ADLs?: Yes Does the patient have difficulty dressing or bathing?: Yes Independently performs ADLs?: Yes (appropriate for developmental age) Does the patient have difficulty walking or climbing stairs?: Yes Weakness of Legs: Both Weakness of Arms/Hands: Both  Permission Sought/Granted Permission sought to share information with : Case Manager, Customer service manager, Family Supports Permission granted to share information with : Yes, Verbal Permission Granted  Share Information with NAME: [name]  Permission granted to share info w AGENCY: [place] granted to share info w Relationship: daughter  Permission granted to share info w Contact Information: [phone]  Emotional Assessment Appearance:: Appears stated age Attitude/Demeanor/Rapport: Gracious Affect (typically observed): Calm Orientation: : Oriented to Self, Oriented to Place, Oriented to Situation Alcohol / Substance Use: Not Applicable Psych Involvement: No (comment)  Admission diagnosis:  vomitting Patient Active Problem List   Diagnosis Date Noted  . Nausea vomiting and diarrhea 03/12/2019  . Lung nodules 03/12/2019  . Atypical chest pain 12/31/2018  .  Asthma 02/12/2017  . Chest pain 11/13/2016  . History of seizures 11/13/2016  . History of stroke 11/13/2016  . Allergic urticaria 08/30/2016  . GAD (generalized  anxiety disorder) 08/30/2016  . Gout 08/30/2016  . Acute renal failure superimposed on stage 3 chronic kidney disease ([place]) 10/11/2015  . Neurogenic claudication   . Morbid obesity ([place])   . Edema   . Essential hypertension, benign 09/30/2014  . Secondary hyperparathyroidism, renal ([place]) 09/30/2014  . Proteinuria 09/30/2014  . Anemia associated with chronic renal failure 09/30/2014  . Chronic kidney disease (CKD), stage IV (severe) ([place]) 09/30/2014  . Hiatal hernia 08/21/2012   PCP:  [name], [name] Pharmacy:   [id number] [place], [place] [address] Phone: [phone] Fax: [phone]     Social Determinants of Health (SDOH) Interventions    Readmission Risk Interventions No flowsheet data found.

## 2019-03-13 NOTE — Evaluation (Signed)
Physical Therapy Evaluation Patient Details Name: [name] MRN: [id number] DOB: [birth year] Today's Date: 03/13/2019   History of Present Illness  85yo female c/o nausea, vomiting, diarrhea, vertigo and chest discomfort. CT negative for acute intracranial abnormality and PE. Covid negative 03/12/19. PMH LBP, gout, HTN, obesity, seizure, CVA, B median nerve neuroplasty  Clinical Impression   Patient received in bed, very talkative and tangential requiring Mod-max cues to redirect and stay on task. Able to complete bed mobility with Mod(I), transfers with S, and gait approximately 239f with RW and min guard. Mod-Max cues provided for safety during session as patient is quite impulsive with poor problem solving and reduce awareness of safety, cognition is likely the main cause behind her falls at home. She was left in bed with all needs met and bed alarm active. Currently recommending skilled HHPT services with 24/7 S, however would consider SNF if 24/7 S is not available or feasible.     Follow Up Recommendations Home health PT;Supervision/Assistance - 24 hour;Other (comment)(would consider SNF if 24/7 S is not available at home)    Equipment Recommendations  None recommended by PT(seems to have all necessary DME)    Recommendations for Other Services       Precautions / Restrictions Precautions Precautions: Fall Restrictions Weight Bearing Restrictions: No      Mobility  Bed Mobility Overal bed mobility: Modified Independent             General bed mobility comments: extended time, use of rail  Transfers Overall transfer level: Needs assistance Equipment used: Rolling walker (2 wheeled) Transfers: Sit to/from Stand Sit to Stand: Supervision         General transfer comment: S for sit to stand with and without AD, cues for safety, wide BOS noted  Ambulation/Gait Ambulation/Gait assistance: Min guard Gait Distance (Feet): 250 Feet Assistive device: Rolling  walker (2 wheeled) Gait Pattern/deviations: Step-through pattern;Decreased stride length;Trunk flexed;Wide base of support Gait velocity: decreased   General Gait Details: slow but steady with RW, intermittently stopping for "dance breaks" in hallway; no signs of fatigue or vertigo noted, Mod-max cues for navigation in hallway  Stairs            Wheelchair Mobility    Modified Rankin (Stroke Patients Only)       Balance Overall balance assessment: Mild deficits observed, not formally tested;History of Falls                                           Pertinent Vitals/Pain Pain Assessment: No/denies pain    Home Living Family/patient expects to be discharged to:: Private residence Living Arrangements: Alone Available Help at Discharge: Family;Available PRN/intermittently Type of Home: House Home Access: Stairs to enter Entrance Stairs-Rails: Right Entrance Stairs-Number of Steps: 3 steps in the back Home Layout: One level Home Equipment: Bedside commode;Walker - 2 wheels;Walker - 4 wheels;Cane - single point;Shower seat Additional Comments: some info taken from prior charting as patient unclear at times    Prior Function Level of Independence: Independent with assistive device(s)         Comments: pt reports she uses a cane, at least 4 falls recently and reports daughter comes over to assist with cleaning     Hand Dominance   Dominant Hand: Right    Extremity/Trunk Assessment   Upper Extremity Assessment Upper Extremity Assessment: Overall WFL for tasks assessed  Lower Extremity Assessment Lower Extremity Assessment: Overall WFL for tasks assessed    Cervical / Trunk Assessment Cervical / Trunk Assessment: Kyphotic  Communication   Communication: No difficulties  Cognition Arousal/Alertness: Awake/alert Behavior During Therapy: Impulsive Overall Cognitive Status: No family/caregiver present to determine baseline cognitive  functioning Area of Impairment: Orientation;Attention;Memory;Following commands;Safety/judgement;Awareness;Problem solving                 Orientation Level: Disoriented to;Situation Current Attention Level: Selective Memory: Decreased short-term memory Following Commands: Follows one step commands consistently;Follows multi-step commands inconsistently;Follows multi-step commands with increased time Safety/Judgement: Decreased awareness of safety Awareness: Intellectual Problem Solving: Requires verbal cues;Requires tactile cues General Comments: impulsive with Mod cues for safety throughout session; some odd behaviors including trying to reach call bell that had fallen to floor while laying in bed, states "I'm 85 years old and I was married for 33 of them!"      General Comments      Exercises     Assessment/Plan    PT Assessment Patient needs continued PT services  PT Problem List Decreased knowledge of use of DME;Obesity;Decreased safety awareness;Decreased balance;Decreased mobility;Decreased coordination       PT Treatment Interventions DME instruction;Balance training;Gait training;Neuromuscular re-education;Stair training;Cognitive remediation;Functional mobility training;Therapeutic activities;Patient/family education;Therapeutic exercise    PT Goals (Current goals can be found in the Care Plan section)  Acute Rehab PT Goals Patient Stated Goal: get well, go home PT Goal Formulation: With patient Time For Goal Achievement: 03/27/19 Potential to Achieve Goals: Good    Frequency Min 3X/week   Barriers to discharge Decreased caregiver support lives alone, unsure if she will be able to get 24/7A from family as they were not in room to discuss or clarify    Co-evaluation               AM-PAC PT "6 Clicks" Mobility  Outcome Measure Help needed turning from your back to your side while in a flat bed without using bedrails?: A Little Help needed moving from  lying on your back to sitting on the side of a flat bed without using bedrails?: A Little Help needed moving to and from a bed to a chair (including a wheelchair)?: A Little Help needed standing up from a chair using your arms (e.g., wheelchair or bedside chair)?: A Little Help needed to walk in hospital room?: A Little Help needed climbing 3-5 steps with a railing? : A Little 6 Click Score: 18    End of Session   Activity Tolerance: Patient tolerated treatment well Patient left: in bed;with call bell/phone within reach;with bed alarm set   PT Visit Diagnosis: History of falling (Z91.81);Unsteadiness on feet (R26.81)    Time: 4210-3128 PT Time Calculation (min) (ACUTE ONLY): 25 min   Charges:   PT Evaluation $PT Eval Low Complexity: 1 Low PT Treatments $Gait Training: 8-22 mins        Windell Norfolk, DPT, PN1   Supplemental Physical Therapist [place]    Pager [phone] Acute Rehab Office [phone]

## 2019-03-15 NOTE — Progress Notes (Signed)
PATIENT: [name] DOB: Jan 21, 1924  REASON FOR VISIT: follow up HISTORY FROM: patient  HISTORY OF PRESENT ILLNESS: Today 03/16/19  [name] is a 85 year old female with history of partial complex type seizures.  She was unable to tolerate full dose of Lamictal, she had dizziness at 75 mg twice a day, was to stay at 50 mg twice a day to determine tolerability.  Her daughter stopped the medication.  She remains on Keppra XR 2000 mg at bedtime.  She has been on phenobarbital in the past, she was unable to tolerate Dilantin or Trileptal. She was recently hospitalized for nausea, vomiting, and diarrhea (03/12/2019).  MRI of the brain did not show acute infarct, she complained of vertigo.  She continues to live alone.  She reports she has continued to have a few episodes of staring or feeling sleepy, she describes as her seizures.  She has not had any seizures where she falls to the ground since last seen.  She says the episodes may occur daily, or she may go a few weeks without any episodes.  She does not know exactly how frequently occur.  She presents today for follow-up accompanied by her daughter.  HISTORY 11/04/2018 SS: [name] is a 85 year old female with history of partial complex type seizures.  She had 2 seizure events in October 2019, and was taking Keppra inconsistently.  She lives alone.  She does manage her medications independently.  She fixes a weekly pillbox.  Her daughter indicates that since her last visit she has had seizures.  She says in the past her mother will follow with her seizures.  Fortunately this type of seizure has not occurred.  She reports twice she has been on the phone with her mother, and her speech has gotten mumbled for a few moments, then she will return back to normal or hang up on her daughter.  Another time, she was visiting with her mother and she stared off.  Apparently these are her typical seizure presentation in the past.  She indicates she has been  compliant taking Keppra extended release 2000 mg at bedtime.  In the past she has been taking phenobarbital and Keppra, but the patient took herself off phenobarbital and has remained on Keppra alone since 2018.  She presents today for follow-up accompanied by her daughter.  REVIEW OF SYSTEMS: Out of a complete 14 system review of symptoms, the patient complains only of the following symptoms, and all other reviewed systems are negative.  Seizures   ALLERGIES: Allergies  Allergen Reactions  . Advicor [Niacin-Lovastatin Er] Other (See Comments)    Reaction unknown to daughter  . Caduet [Amlodipine-Atorvastatin] Other (See Comments)    Reaction unknown to daughter  . Codeine Other (See Comments)    Reaction unknown to daughter  . Dilantin [Phenytoin] Other (See Comments)    Reaction unknown to daughter  . Lescol [Fluvastatin] Other (See Comments)    Reaction unknown to daughter  . Pamelor [Nortriptyline] Other (See Comments)    Reaction unknown to daughter  . Penicillins Hives    Did it involve swelling of the face/tongue/throat, SOB, or low BP? Unk Did it involve sudden or severe rash/hives, skin peeling, or any reaction on the inside of your mouth or nose? Unk Did you need to seek medical attention at a hospital or doctor's office? Unk When did it last happen? "it was a long time ago" If all above answers are "NO", may proceed with cephalosporin use.   Marland Kitchen  Sulfa Antibiotics Hives  . Trileptal [Oxcarbazepine] Other (See Comments)    Reaction unknown to daughter  . Ultram [Tramadol] Other (See Comments)    Reaction unknown to daughter  . Welchol [Colesevelam] Other (See Comments)    Reaction unknown to daughter  . Latex Rash  . Tomato Rash    HOME MEDICATIONS: Outpatient Medications Prior to Visit  Medication Sig Dispense Refill  . acetaminophen (TYLENOL) 500 MG tablet Take 500-1,000 mg by mouth every 8 (eight) hours as needed for mild pain or headache.     . allopurinol  (ZYLOPRIM) 100 MG tablet TAKE 1/2 TABLET BY MOUTH EVERY DAY 45 tablet 1  . aspirin 81 MG tablet Take 81 mg by mouth daily.    . cetirizine (ZYRTEC) 10 MG tablet TAKE 1 TABLET BY MOUTH EVERY DAY 30 tablet 0  . famotidine (PEPCID) 40 MG tablet TAKE 1 TABLET BY MOUTH EVERY DAY 30 tablet 0  . Fluticasone Furoate (ARNUITY ELLIPTA) 200 MCG/ACT AEPB Inhale 1 puff into the lungs daily. 30 each 0  . ketoconazole (NIZORAL) 2 % cream Apply to the affected areas underneath breasts, belly and in groin region ONCE daily x2-4 weeks. (Patient taking differently: Apply 1 application topically See admin instructions. Apply to the affected areas underneath breasts, belly and in groin region ONCE daily for 2-4 weeks) 60 g 3  . levETIRAcetam (KEPPRA XR) 500 MG 24 hr tablet Take 4 tablets (2,000 mg total) by mouth at bedtime. 360 tablet 3  . metolazone (ZAROXOLYN) 2.5 MG tablet TAKE 1 TABLET BY MOUTH ON MONDAYS, WEDNESDAYS, AND FRIDAYS (Patient taking differently: Take 2.5 mg by mouth every Monday, Wednesday, and Friday. ) 36 tablet 1  . metoprolol tartrate (LOPRESSOR) 25 MG tablet Take 0.5 tablets (12.5 mg total) by mouth 2 (two) times daily. 30 tablet 5  . mometasone (ELOCON) 0.1 % ointment Apply to skin once daily as directed after bathing. 90 g 3  . montelukast (SINGULAIR) 10 MG tablet TAKE 1 TABLET BY MOUTH AT BEDTIME 30 tablet 0  . Multiple Vitamins-Minerals (ONE-A-DAY WOMENS 50 PLUS) TABS Take 1 tablet by mouth daily with breakfast.    . nystatin-triamcinolone ointment (MYCOLOG) Apply 1 application topically 2 (two) times daily. 90 g 1  . omeprazole (PRILOSEC) 40 MG capsule Take 1 capsule (40 mg total) by mouth daily. (Patient taking differently: Take 40 mg by mouth daily as needed (for reflux). ) 30 capsule 5  . Propylene Glycol (SYSTANE BALANCE OP) Place 1 drop into both eyes 2 (two) times daily.     Marland Kitchen triamcinolone cream (KENALOG) 0.1 % Apply 1 application topically 2 (two) times daily. (Patient taking  differently: Apply 1 application topically 2 (two) times daily as needed (for eczema). ) 453.6 g 2  . trimethoprim-polymyxin b (POLYTRIM) ophthalmic solution Place 1 drop into the right eye every 6 (six) hours. 10 mL 0  . lamoTRIgine (LAMICTAL) 25 MG tablet Take 2 tablets (50 mg total) by mouth 2 (two) times daily. (Patient not taking: Reported on 03/16/2019) 240 tablet 0   No facility-administered medications prior to visit.     PAST MEDICAL HISTORY: Past Medical History:  Diagnosis Date  . Chronic low back pain   . Degenerative arthritis   . Eczema   . Edema   . Gout   . Headache   . Hiatal hernia   . Hypertension   . Internal hemorrhoids   . Neurogenic claudication   . Obesity   . Seizure disorder ([place])   .  Seizures ([place])   . Stroke ([place])   . Urticaria     PAST SURGICAL HISTORY: Past Surgical History:  Procedure Laterality Date  . ABDOMINAL HYSTERECTOMY    . APPENDECTOMY    . APPENDECTOMY    . bilateral knees    . cataracts    . CHOLECYSTECTOMY    . mammoplasty reduction    . NEUROPLASTY / TRANSPOSITION MEDIAN NERVE AT CARPAL TUNNEL BILATERAL    . RE-EXCISION OF BREAST LUMPECTOMY    . TUBAL LIGATION      FAMILY HISTORY: Family History  Problem Relation Age of Onset  . Diabetes Mother   . Heart disease Mother   . Heart disease Father   . Cancer Sister   . Cancer Brother   . Cancer Sister     SOCIAL HISTORY: Social History   Socioeconomic History  . Marital status: Widowed    Spouse name: Not on file  . Number of children: 4  . Years of education: Not on file  . Highest education level: Not on file  Occupational History  . Occupation: Pharmacist, hospital    Comment: retired  Scientific laboratory technician  . Financial resource strain: Not hard at all  . Food insecurity    Worry: Never true    Inability: Never true  . Transportation needs    Medical: No    Non-medical: No  Tobacco Use  . Smoking status: Never Smoker  . Smokeless tobacco: Never Used  Substance and Sexual  Activity  . Alcohol use: No  . Drug use: No  . Sexual activity: Not Currently  Lifestyle  . Physical activity    Days per week: 0 days    Minutes per session: 0 min  . Stress: Only a little  Relationships  . Social connections    Talks on phone: More than three times a week    Gets together: More than three times a week    Attends religious service: More than 4 times per year    Active member of club or organization: Yes    Attends meetings of clubs or organizations: More than 4 times per year    Relationship status: Widowed  . Intimate partner violence    Fear of current or ex partner: No    Emotionally abused: No    Physically abused: No    Forced sexual activity: No  Other Topics Concern  . Not on file  Social History Narrative   Patient is right handed.   Patient drinks some caffeine daily.    PHYSICAL EXAM  Vitals:   03/16/19 0937  BP: (!) 150/88  Pulse: (!) 108  Temp: 97.9 F (36.6 C)  Weight: 229 lb (103.9 kg)  Height: 5\' 4"  (1.626 m)   Body mass index is 39.31 kg/m.  Generalized: Well developed, in no acute distress   Neurological examination  Mentation: Alert oriented to time, place, history taking. Follows all commands speech and language fluent Cranial nerve II-XII: Pupils were equal round reactive to light (swelling to right eyelid). Extraocular movements were full, visual field were full on confrontational test. Facial sensation and strength were normal. Head turning and shoulder shrug  were normal and symmetric. Motor: The motor testing reveals 5 over 5 strength of all 4 extremities. Good symmetric motor tone is noted throughout.  Sensory: Sensory testing is intact to soft touch on all 4 extremities. No evidence of extinction is noted.  Coordination: Cerebellar testing reveals good finger-nose-finger and heel-to-shin bilaterally.  Gait and station: Gait  is wide-based, uses a cane, forward leaning, tandem gait was not attempted Reflexes: Deep tendon  reflexes are symmetric   DIAGNOSTIC DATA (LABS, IMAGING, TESTING) - I reviewed patient records, labs, notes, testing and imaging myself where available.  Lab Results  Component Value Date   WBC 13.5 (H) 03/13/2019   HGB 10.7 (L) 03/13/2019   HCT 32.6 (L) 03/13/2019   MCV 76.7 (L) 03/13/2019   PLT 247 03/13/2019      Component Value Date/Time   NA 143 03/13/2019 0514   NA 141 01/07/2019 1128   K 3.5 03/13/2019 0514   CL 105 03/13/2019 0514   CO2 26 03/13/2019 0514   GLUCOSE 113 (H) 03/13/2019 0514   BUN 16 03/13/2019 0514   BUN 24 01/07/2019 1128   CREATININE 1.56 (H) 03/13/2019 0514   CALCIUM 9.8 03/13/2019 0514   PROT 6.7 03/12/2019 1622   PROT 7.1 01/07/2019 1128   ALBUMIN 3.8 03/12/2019 1622   ALBUMIN 4.3 01/07/2019 1128   AST 29 03/12/2019 1622   ALT 12 03/12/2019 1622   ALKPHOS 43 03/12/2019 1622   BILITOT 0.9 03/12/2019 1622   BILITOT 0.5 01/07/2019 1128   GFRNONAA 28 (L) 03/13/2019 0514   GFRAA 33 (L) 03/13/2019 0514   Lab Results  Component Value Date   CHOL 150 12/30/2018   HDL 42 12/30/2018   LDLCALC 88 12/30/2018   TRIG 98 12/30/2018   CHOLHDL 3.6 12/30/2018   Lab Results  Component Value Date   HGBA1C 5.8% 01/07/2013   Lab Results  Component Value Date   VITAMINB12 1,882 (H) 03/17/2018   Lab Results  Component Value Date   TSH 1.740 08/01/2016    ASSESSMENT AND PLAN 85 y.o. year old female  has a past medical history of Chronic low back pain, Degenerative arthritis, Eczema, Edema, Gout, Headache, Hiatal hernia, Hypertension, Internal hemorrhoids, Neurogenic claudication, Obesity, Seizure disorder ([place]), Seizures ([place]), Stroke ([place]), and Urticaria. here with:  1.  Seizures  She will continue taking Keppra XR 2000 mg at bedtime.  Prior Keppra levels have been elevated. We will restart Lamictal very slowly. She got up to 75 mg twice daily, and had dizziness, she was to stay at 50 mg twice daily, but stopped the medication.  She indicates she has  continued to have staring episodes, but they are unable to tell me the frequency, as she lives alone.  She has not had any significant type seizure where she falls to the ground.  She will restart Lamictal 25 mg twice a day for 1 month.  If she does well, we will go up on the medication.  I did offer we could start Depakote instead of re-trial of Lamictal. She and her daughter decided to give Lamictal another chance.  I would like for her to follow-up in 4 months with Dr. Jannifer Franklin or sooner if needed.  I did advise if her symptoms worsen or she develops any new symptoms she should let us know.  I spent 15 minutes with the patient. 50% of this time was spent discussing her plan of care.  Butler Denmark, AGNP-C, DNP 03/16/2019, 9:51 AM [facility] [address], [place], [place] [address] [phone]

## 2019-03-16 ENCOUNTER — Encounter: Payer: Self-pay | Admitting: Neurology

## 2019-03-16 ENCOUNTER — Ambulatory Visit: Payer: Medicare Other | Admitting: Neurology

## 2019-03-16 ENCOUNTER — Other Ambulatory Visit: Payer: Self-pay

## 2019-03-16 VITALS — BP 150/88 | HR 108 | Temp 97.9°F | Ht 64.0 in | Wt 229.0 lb

## 2019-03-16 DIAGNOSIS — Z87898 Personal history of other specified conditions: Secondary | ICD-10-CM

## 2019-03-16 NOTE — ED Notes (Signed)
Wasted 75 mcg fentanyl with Mallory, RN in sharps container.

## 2019-03-16 NOTE — Patient Instructions (Signed)
Restart Lamictal 25 mg twice a day, x 1 month, after that, call me and let me know, then we could continue to increase the medication slowly

## 2019-03-17 NOTE — Progress Notes (Signed)
I have read the note, and I agree with the clinical assessment and plan.  Damyia Strider K Malyk Girouard   

## 2019-03-19 ENCOUNTER — Ambulatory Visit: Payer: Medicare Other | Admitting: Family

## 2019-03-20 ENCOUNTER — Ambulatory Visit (INDEPENDENT_AMBULATORY_CARE_PROVIDER_SITE_OTHER): Payer: Medicare Other

## 2019-03-20 ENCOUNTER — Other Ambulatory Visit: Payer: Self-pay

## 2019-03-20 DIAGNOSIS — H8110 Benign paroxysmal vertigo, unspecified ear: Secondary | ICD-10-CM | POA: Diagnosis not present

## 2019-03-20 DIAGNOSIS — R112 Nausea with vomiting, unspecified: Secondary | ICD-10-CM

## 2019-03-20 DIAGNOSIS — G4089 Other seizures: Secondary | ICD-10-CM

## 2019-03-20 DIAGNOSIS — I120 Hypertensive chronic kidney disease with stage 5 chronic kidney disease or end stage renal disease: Secondary | ICD-10-CM

## 2019-03-20 DIAGNOSIS — D631 Anemia in chronic kidney disease: Secondary | ICD-10-CM

## 2019-03-20 DIAGNOSIS — Z6841 Body Mass Index (BMI) 40.0 and over, adult: Secondary | ICD-10-CM

## 2019-03-20 DIAGNOSIS — R197 Diarrhea, unspecified: Secondary | ICD-10-CM

## 2019-03-20 DIAGNOSIS — M542 Cervicalgia: Secondary | ICD-10-CM

## 2019-03-20 DIAGNOSIS — N184 Chronic kidney disease, stage 4 (severe): Secondary | ICD-10-CM

## 2019-03-20 DIAGNOSIS — M549 Dorsalgia, unspecified: Secondary | ICD-10-CM

## 2019-03-20 DIAGNOSIS — F411 Generalized anxiety disorder: Secondary | ICD-10-CM

## 2019-03-20 DIAGNOSIS — R918 Other nonspecific abnormal finding of lung field: Secondary | ICD-10-CM

## 2019-03-26 ENCOUNTER — Telehealth: Payer: Self-pay | Admitting: Family

## 2019-03-26 NOTE — Telephone Encounter (Signed)
VO given for medication planning to [facility] w/ [facility]. Pt has a am dosage box but not a pm box, may also suggest to family about getting prepack & delivery from a local pharmacy.

## 2019-03-27 ENCOUNTER — Other Ambulatory Visit: Payer: Self-pay | Admitting: Family Medicine

## 2019-04-02 ENCOUNTER — Other Ambulatory Visit: Payer: Self-pay | Admitting: Allergy and Immunology

## 2019-04-02 ENCOUNTER — Other Ambulatory Visit: Payer: Self-pay | Admitting: Family

## 2019-04-04 ENCOUNTER — Other Ambulatory Visit: Payer: Self-pay | Admitting: Allergy and Immunology

## 2019-04-06 ENCOUNTER — Other Ambulatory Visit: Payer: Self-pay

## 2019-04-06 NOTE — Telephone Encounter (Signed)
Spoke with patient's daughter to notify her that medication was [facility] [place]. Patient was reminded to keep his 04/16/19 tele-visit.

## 2019-04-06 NOTE — Telephone Encounter (Signed)
Patients daughter called requesting a refill on Arnuity 200. Patient is doing physical therapy and she is almost out of her inhaler.   Daughter wanted to schedule a televist as [name] has been through a lot since thanksgiving and really does not need to come out and about.  [facility]

## 2019-04-08 ENCOUNTER — Telehealth: Payer: Self-pay | Admitting: *Deleted

## 2019-04-08 ENCOUNTER — Other Ambulatory Visit: Payer: Self-pay

## 2019-04-08 MED ORDER — FAMOTIDINE 40 MG PO TABS: 40.0000 mg | ORAL_TABLET | Freq: Every day | ORAL | 0 refills | Status: DC

## 2019-04-08 NOTE — Telephone Encounter (Signed)
FYI VM from [place] w/ [facility] Pt had a fall on her buttocks, pain 8/10 no swelling or bruising

## 2019-04-16 ENCOUNTER — Ambulatory Visit (INDEPENDENT_AMBULATORY_CARE_PROVIDER_SITE_OTHER): Payer: Medicare Other | Admitting: Allergy and Immunology

## 2019-04-16 ENCOUNTER — Encounter: Payer: Self-pay | Admitting: Allergy and Immunology

## 2019-04-16 ENCOUNTER — Other Ambulatory Visit: Payer: Self-pay

## 2019-04-16 DIAGNOSIS — J4530 Mild persistent asthma, uncomplicated: Secondary | ICD-10-CM | POA: Diagnosis not present

## 2019-04-16 DIAGNOSIS — L299 Pruritus, unspecified: Secondary | ICD-10-CM

## 2019-04-16 DIAGNOSIS — K219 Gastro-esophageal reflux disease without esophagitis: Secondary | ICD-10-CM

## 2019-04-16 DIAGNOSIS — L989 Disorder of the skin and subcutaneous tissue, unspecified: Secondary | ICD-10-CM

## 2019-04-16 DIAGNOSIS — D72824 Basophilia: Secondary | ICD-10-CM | POA: Diagnosis not present

## 2019-04-16 DIAGNOSIS — L308 Other specified dermatitis: Secondary | ICD-10-CM | POA: Diagnosis not present

## 2019-04-16 DIAGNOSIS — D729 Disorder of white blood cells, unspecified: Secondary | ICD-10-CM

## 2019-04-16 NOTE — Patient Instructions (Addendum)
  1. Continue the following:   A. Arnuity 200 - one inhalation one time per day  B. Cetirizine 10mg  - one tablet two times per day  C. Montelukast 10mg  - one tablet one time per day  D. Famotidine 40 mg - 1 tablet 1 time per day  E. Omeprazole 40 mg - one tablet one time per day  F. Triamcinolone 0.1% cream applied to skin after shower  2. Albuterol nebulization or Proventil HFA IF NEEDED  3. Return to clinic in 3 months or earlier if problem   4. Obtain blood at Primary care doctor's office: FISH for CML (BCR-ABL1 fusion), Jak2 with reflex including CALR for neutrophilia and basophilia.  5.  Obtain flu vaccine and Covid vaccine.

## 2019-04-16 NOTE — Progress Notes (Signed)
Televisit: Patient is at home. Provider is in office.  Start time: 11:33 End time: 11:52  Verbal consent was given to file insurance.

## 2019-04-16 NOTE — Progress Notes (Signed)
[place]   Follow-up Note  Referring Provider: [name], [name] Primary Provider: [name], [name] Date of Office Visit: 04/16/2019  Subjective:   [name] (DOB: December 14, 1923) is a 85 y.o. female who returns to the [facility] [place] on 04/16/2019 in re-evaluation of the following:  HPI: This is a E - med visit requested by patient who is located at home.  She is followed in this clinic for asthma and reflux induced respiratory disease and pruritic dermatitis and neutrophilia + basophilia.  My last contact with her was via a E - med visit on 12 August 2018.  Breathing well. Using Arnuity. No SABA use.  Reflux OK. Throat OK.  Still itching badly.  Having issues with 'falls'. Admited to hospital 12 March 2019 for chest pain with negative diagnostic evaluation  Did not receive flu vaccine and has never received the flu vaccine and will not be receiving the flu vaccine.  Allergies as of 04/16/2019      Reactions   Advicor [niacin-lovastatin Er] Other (See Comments)   Reaction unknown to daughter   Caduet [amlodipine-atorvastatin] Other (See Comments)   Reaction unknown to daughter   Codeine Other (See Comments)   Reaction unknown to daughter   Dilantin [phenytoin] Other (See Comments)   Reaction unknown to daughter   Lescol [fluvastatin] Other (See Comments)   Reaction unknown to daughter   Pamelor [nortriptyline] Other (See Comments)   Reaction unknown to daughter   Penicillins Hives   Did it involve swelling of the face/tongue/throat, SOB, or low BP? Unk Did it involve sudden or severe rash/hives, skin peeling, or any reaction on the inside of your mouth or nose? Unk Did you need to seek medical attention at a hospital or doctor's office? Unk When did it last happen? "it was a long time ago" If all above answers are "NO", may proceed with cephalosporin use.   Sulfa Antibiotics Hives   Trileptal  [oxcarbazepine] Other (See Comments)   Reaction unknown to daughter   Ultram [tramadol] Other (See Comments)   Reaction unknown to daughter   Welchol [colesevelam] Other (See Comments)   Reaction unknown to daughter   Latex Rash   Tomato Rash      Medication List      acetaminophen 500 MG tablet Commonly known as: TYLENOL Take 500-1,000 mg by mouth every 8 (eight) hours as needed for mild pain or headache.   allopurinol 100 MG tablet Commonly known as: ZYLOPRIM TAKE 1/2 TABLET BY MOUTH EVERY DAY   Arnuity Ellipta 200 MCG/ACT Aepb Generic drug: Fluticasone Furoate TAKE 1 PUFF BY MOUTH EVERY DAY   aspirin 81 MG tablet Take 81 mg by mouth daily.   cetirizine 10 MG tablet Commonly known as: ZYRTEC TAKE 1 TABLET BY MOUTH EVERY DAY   famotidine 40 MG tablet Commonly known as: PEPCID Take 1 tablet (40 mg total) by mouth daily.   ketoconazole 2 % cream Commonly known as: NIZORAL Apply to the affected areas underneath breasts, belly and in groin region ONCE daily x2-4 weeks.   lamoTRIgine 25 MG tablet Commonly known as: LAMICTAL Take 2 tablets (50 mg total) by mouth 2 (two) times daily.   levETIRAcetam 500 MG 24 hr tablet Commonly known as: KEPPRA XR Take 4 tablets (2,000 mg total) by mouth at bedtime.   metolazone 2.5 MG tablet Commonly known as: ZAROXOLYN TAKE 1 TABLET BY MOUTH ON MONDAYS, WEDNESDAYS, AND FRIDAYS  metoprolol tartrate 25 MG tablet Commonly known as: LOPRESSOR Take 0.5 tablets (12.5 mg total) by mouth 2 (two) times daily.   mometasone 0.1 % ointment Commonly known as: ELOCON Apply to skin once daily as directed after bathing.   montelukast 10 MG tablet Commonly known as: SINGULAIR TAKE 1 TABLET BY MOUTH AT BEDTIME   nystatin-triamcinolone ointment Commonly known as: MYCOLOG Apply 1 application topically 2 (two) times daily.   omeprazole 40 MG capsule Commonly known as: PriLOSEC Take 1 capsule (40 mg total) by mouth daily.   One-A-Day  Womens 50 Plus Tabs Take 1 tablet by mouth daily with breakfast.   SYSTANE BALANCE OP Place 1 drop into both eyes 2 (two) times daily.   triamcinolone cream 0.1 % Commonly known as: KENALOG Apply 1 application topically 2 (two) times daily.   trimethoprim-polymyxin b ophthalmic solution Commonly known as: Polytrim Place 1 drop into the right eye every 6 (six) hours.       Past Medical History:  Diagnosis Date  . Chronic low back pain   . Degenerative arthritis   . Eczema   . Edema   . Gout   . Headache   . Hiatal hernia   . Hypertension   . Internal hemorrhoids   . Neurogenic claudication   . Obesity   . Seizure disorder ([place])   . Seizures ([place])   . Stroke ([place])   . Urticaria     Past Surgical History:  Procedure Laterality Date  . ABDOMINAL HYSTERECTOMY    . APPENDECTOMY    . APPENDECTOMY    . bilateral knees    . cataracts    . CHOLECYSTECTOMY    . mammoplasty reduction    . NEUROPLASTY / TRANSPOSITION MEDIAN NERVE AT CARPAL TUNNEL BILATERAL    . RE-EXCISION OF BREAST LUMPECTOMY    . TUBAL LIGATION      Review of systems negative except as noted in HPI / PMHx or noted below:  Review of Systems  Constitutional: Negative.   HENT: Negative.   Eyes: Negative.   Respiratory: Negative.   Cardiovascular: Negative.   Gastrointestinal: Negative.   Genitourinary: Negative.   Musculoskeletal: Negative.   Skin: Negative.   Neurological: Negative.   Endo/Heme/Allergies: Negative.   Psychiatric/Behavioral: Negative.      Objective:   There were no vitals filed for this visit.        Physical Exam - deferred  Diagnostics:    Results of blood tests obtained 13 March 2019 identifies WBC 13.5, absolute neutrophil 10,000, absolute basophil 300, absolute lymphocyte 2700, hemoglobin 10.7, platelet 247  Assessment and Plan:   1. Asthma, well controlled, mild persistent   2. Pruritic disorder   3. Inflammatory dermatosis   4. Basophilia   5.  Neutrophilia   6. Gastroesophageal reflux disease, unspecified whether esophagitis present     1. Continue the following:   A. Arnuity 200 - one inhalation one time per day  B. Cetirizine 10mg  - one tablet two times per day  C. Montelukast 10mg  - one tablet one time per day  D. Famotidine 40 mg - 1 tablet 1 time per day  E. Omeprazole 40 mg - one tablet one time per day  F. Triamcinolone 0.1% cream applied to skin after shower  2. Albuterol nebulization or Proventil HFA IF NEEDED  3. Return to clinic in 3 months or earlier if problem   4. Obtain blood at Primary care doctor's office: FISH for CML (BCR-ABL1 fusion), Jak2 with reflex  including CALR for neutrophilia and basophilia.  5.  Obtain flu vaccine and Covid vaccine.   [name] appears to have pretty good control of her airway issue and reflux issue but her pruritic disorder is still out of control.  With her neutrophilia and basophilia that has been present for a prolonged period in time now I suspect that she probably has a myeloproliferative disease responsible for this neutrophilia and basophilia and her pruritus.  I have asked her primary care doctor to arrange for [name] to come by the office and obtain the blood test noted above so we can investigate for CML and other myeloproliferative disease.  She will continue on the therapy noted above to address her other issues.  In reality, I do not think she will be receiving the flu vaccine or the Covid vaccine based upon her wishes.  Allena Katz, MD Allergy / Immunology [place]

## 2019-04-20 ENCOUNTER — Encounter: Payer: Self-pay | Admitting: Allergy and Immunology

## 2019-04-23 ENCOUNTER — Other Ambulatory Visit: Payer: Self-pay | Admitting: Family

## 2019-04-23 DIAGNOSIS — D72828 Other elevated white blood cell count: Secondary | ICD-10-CM

## 2019-04-23 DIAGNOSIS — D72824 Basophilia: Secondary | ICD-10-CM

## 2019-04-23 DIAGNOSIS — D729 Disorder of white blood cells, unspecified: Secondary | ICD-10-CM

## 2019-04-23 NOTE — Progress Notes (Signed)
Please have patient come in for lab work per her allergen, Allena Katz, MD. I have placed these in Epic that we will route to him.

## 2019-04-23 NOTE — Progress Notes (Signed)
Patient daughter aware and verbalized understanding.

## 2019-04-27 ENCOUNTER — Other Ambulatory Visit: Payer: Self-pay | Admitting: Family

## 2019-04-28 ENCOUNTER — Other Ambulatory Visit: Payer: Self-pay | Admitting: Allergy and Immunology

## 2019-05-05 ENCOUNTER — Telehealth: Payer: Self-pay

## 2019-05-05 ENCOUNTER — Other Ambulatory Visit: Payer: Medicare PPO

## 2019-05-05 DIAGNOSIS — D72824 Basophilia: Secondary | ICD-10-CM

## 2019-05-05 DIAGNOSIS — D729 Disorder of white blood cells, unspecified: Secondary | ICD-10-CM

## 2019-05-05 NOTE — Telephone Encounter (Signed)
-----   Message from [name], [place] sent at 05/05/2019  7:59 AM EST ----- Can we call patient and remind her to get these labs completed.   [name], [name] ----- Message ----- From: Jiles Prows, MD Sent: 04/16/2019  11:52 AM EST To: [name], [name]  Good Afternoon [name], [name] ([id number]) has had a pruritic disorder in the setting of neutrophilia and basophilia and I suspect she may have CML or other myeloproliferative disease.  She is somewhat isolated in her home and transportation for diagnostic studies are somewhat difficult.  She does live near your office and she could come by for a blood test.  Can you get the following blood test performed by calling her and having her arrange transportation to your office for the blood draw?    She needs:  FISH for CML, Jak2 with reflex including CALR for neutrophilia and basophilia.  Please let me know when the results return.  Thank You, Allena Katz

## 2019-05-05 NOTE — Telephone Encounter (Signed)
Aware - and will do as soon as possible.

## 2019-05-06 ENCOUNTER — Ambulatory Visit: Payer: Medicare Other | Admitting: Neurology

## 2019-05-14 ENCOUNTER — Other Ambulatory Visit: Payer: Self-pay | Admitting: Neurology

## 2019-05-14 LAB — CML CHROMOSOME/FISH PROFILE
Cells Analyzed: 0
Cells Analyzed: 200
Cells Counted: 0
Cells Counted: 200
Cells Karyotyped: 0

## 2019-05-14 LAB — CALR + MPL (REFLEXED)

## 2019-05-14 LAB — JAK2 W/REFLEX TO CALR/MPL

## 2019-05-14 NOTE — Telephone Encounter (Signed)
LMVM for pt/caregiver to return call about pts refill request on her lamotrigine.

## 2019-05-19 NOTE — Telephone Encounter (Signed)
I will refill Lamictal 50 mg BID, if that is what is working best for her. Daughter should keep a close check on her, record episodes. Keep appointment with Dr. Jannifer Franklin in April.

## 2019-05-19 NOTE — Telephone Encounter (Signed)
I called spoke to [name], daughter of pt.  (caregiver). Pt is taking lamotrigine 50mg  po bid, as 75mg  po bid caused dizziness, confusion.  Pt lives at home in [place] by herself.  [name] calls and speaks to her daily, and checks on her.  Still has some sz, not as frequent. (she could not give me a #).  Has an appt in 07-28-19 with Dr. Jannifer Franklin.

## 2019-06-05 ENCOUNTER — Other Ambulatory Visit: Payer: Self-pay

## 2019-06-05 MED ORDER — MONTELUKAST SODIUM 10 MG PO TABS: ORAL_TABLET | ORAL | 5 refills | Status: DC

## 2019-06-27 ENCOUNTER — Other Ambulatory Visit: Payer: Self-pay | Admitting: Family

## 2019-07-23 ENCOUNTER — Other Ambulatory Visit: Payer: Self-pay | Admitting: Family

## 2019-07-24 ENCOUNTER — Other Ambulatory Visit: Payer: Self-pay | Admitting: Family

## 2019-07-28 ENCOUNTER — Ambulatory Visit: Payer: Medicare PPO | Admitting: Neurology

## 2019-07-28 ENCOUNTER — Encounter: Payer: Self-pay | Admitting: Neurology

## 2019-07-28 ENCOUNTER — Other Ambulatory Visit: Payer: Self-pay

## 2019-07-28 VITALS — BP 162/83 | HR 99 | Temp 97.2°F | Ht 64.0 in | Wt 224.0 lb

## 2019-07-28 DIAGNOSIS — G40909 Epilepsy, unspecified, not intractable, without status epilepticus: Secondary | ICD-10-CM

## 2019-07-28 MED ORDER — DIVALPROEX SODIUM 250 MG PO DR TAB: DELAYED_RELEASE_TABLET | ORAL | 3 refills | Status: DC

## 2019-07-28 NOTE — Patient Instructions (Signed)
We will start Depakote for the seizures.  Depakote (valproic acid) is a seizure medication that also has an FDA approval for migraine headache. The most common potential side effects of this medication include weight gain, tremor, or possible stomach upset. This medication can potentially cause liver problems. If confusion is noted on this medication, contact our office immediately.

## 2019-07-28 NOTE — Progress Notes (Signed)
Reason for visit: Seizures  [name] is an 86 y.o. female  History of present illness:  [name] is a 86 year old right-handed black female with a history of partial complex type seizures.  The patient continues to take Keppra taking 4 of the 500 mg extended release tablets at night.  She initially got good benefit but she has had more frequent breakthrough seizures on this medication.  The patient was placed on Lamictal in low-dose but could not tolerate it and the medication was stopped.  The patient continues to have seizure type episodes, the daughter witnessed 1 on the morning of this visit.  The patient will have an event where she stares off, fortunately she does not fall.  The patient returns for further evaluation.  The patient has some droopiness of the right eye that has been present for a "long time", she denies any double vision.  Past Medical History:  Diagnosis Date  . Chronic low back pain   . Degenerative arthritis   . Eczema   . Edema   . Gout   . Headache   . Hiatal hernia   . Hypertension   . Internal hemorrhoids   . Neurogenic claudication   . Obesity   . Seizure disorder ([place])   . Seizures ([place])   . Stroke ([place])   . Urticaria     Past Surgical History:  Procedure Laterality Date  . ABDOMINAL HYSTERECTOMY    . APPENDECTOMY    . APPENDECTOMY    . bilateral knees    . cataracts    . CHOLECYSTECTOMY    . mammoplasty reduction    . NEUROPLASTY / TRANSPOSITION MEDIAN NERVE AT CARPAL TUNNEL BILATERAL    . RE-EXCISION OF BREAST LUMPECTOMY    . TUBAL LIGATION      Family History  Problem Relation Age of Onset  . Diabetes Mother   . Heart disease Mother   . Heart disease Father   . Cancer Sister   . Cancer Brother   . Cancer Sister     Social history:  reports that she has never smoked. She has never used smokeless tobacco. She reports that she does not drink alcohol or use drugs.    Allergies  Allergen Reactions  . Advicor  [Niacin-Lovastatin Er] Other (See Comments)    Reaction unknown to daughter  . Caduet [Amlodipine-Atorvastatin] Other (See Comments)    Reaction unknown to daughter  . Codeine Other (See Comments)    Reaction unknown to daughter  . Dilantin [Phenytoin] Other (See Comments)    Reaction unknown to daughter  . Lescol [Fluvastatin] Other (See Comments)    Reaction unknown to daughter  . Pamelor [Nortriptyline] Other (See Comments)    Reaction unknown to daughter  . Penicillins Hives    Did it involve swelling of the face/tongue/throat, SOB, or low BP? Unk Did it involve sudden or severe rash/hives, skin peeling, or any reaction on the inside of your mouth or nose? Unk Did you need to seek medical attention at a hospital or doctor's office? Unk When did it last happen? "it was a long time ago" If all above answers are "NO", may proceed with cephalosporin use.   . Sulfa Antibiotics Hives  . Trileptal [Oxcarbazepine] Other (See Comments)    Reaction unknown to daughter  . Ultram [Tramadol] Other (See Comments)    Reaction unknown to daughter  . Welchol [Colesevelam] Other (See Comments)    Reaction unknown to daughter  . Latex  Rash  . Tomato Rash    Medications:  Prior to Admission medications   Medication Sig Start Date End Date Taking? Authorizing Provider  acetaminophen (TYLENOL) 500 MG tablet Take 500-1,000 mg by mouth every 8 (eight) hours as needed for mild pain or headache.    Yes [provider]  allopurinol (ZYLOPRIM) 100 MG tablet TAKE 1/2 TABLET BY MOUTH EVERY DAY 03/05/19  Yes Stacks, Cletus Gash, MD  ARNUITY ELLIPTA 200 MCG/ACT AEPB INHALE 1 PUFF BY MOUTH EVERY DAY 04/28/19  Yes Kozlow, Donnamarie Poag, MD  aspirin 81 MG tablet Take 81 mg by mouth daily.   Yes [provider]  cetirizine (ZYRTEC) 10 MG tablet TAKE 1 TABLET BY MOUTH EVERY DAY 04/02/19  Yes Hawks, Christy A, [name]  famotidine (PEPCID) 40 MG tablet Take 1 tablet (40 mg total) by mouth daily. 04/08/19  Yes  Kozlow, Donnamarie Poag, MD  ketoconazole (NIZORAL) 2 % cream Apply to the affected areas underneath breasts, belly and in groin region ONCE daily x2-4 weeks. Patient taking differently: Apply 1 application topically See admin instructions. Apply to the affected areas underneath breasts, belly and in groin region ONCE daily for 2-4 weeks 09/16/18  Yes Ronnie Doss M, DO  lamoTRIgine (LAMICTAL) 25 MG tablet Take 2 tablets (50 mg total) by mouth 2 (two) times daily. 05/19/19  Yes Suzzanne Cloud, NP  levETIRAcetam (KEPPRA XR) 500 MG 24 hr tablet Take 4 tablets (2,000 mg total) by mouth at bedtime. 11/10/18  Yes Suzzanne Cloud, NP  metolazone (ZAROXOLYN) 2.5 MG tablet Take 1 Tablet by mouth on Mondays, Wednesdays and Fridays (Needs to be seen before next refill) 07/24/19  Yes Hawks, Alyse Low A, [name]  metoprolol tartrate (LOPRESSOR) 25 MG tablet Take 0.5 tablets (12.5 mg total) by mouth 2 (two) times daily. (Needs to be seen before next refill) 06/29/19  Yes Hawks, Christy A, [name]  mometasone (ELOCON) 0.1 % ointment Apply to skin once daily as directed after bathing. 03/04/18  Yes Kozlow, Donnamarie Poag, MD  montelukast (SINGULAIR) 10 MG tablet TAKE 1 TABLET BY MOUTH AT BEDTIME 06/05/19  Yes Kozlow, Donnamarie Poag, MD  Multiple Vitamins-Minerals (ONE-A-DAY WOMENS 50 PLUS) TABS Take 1 tablet by mouth daily with breakfast.   Yes [provider]  nystatin-triamcinolone ointment (MYCOLOG) Apply 1 application topically 2 (two) times daily. 11/16/15  Yes Hawks, Christy A, [name]  omeprazole (PRILOSEC) 40 MG capsule Take 1 capsule (40 mg total) by mouth daily. Patient taking differently: Take 40 mg by mouth daily as needed (for reflux).  07/03/18  Yes Kozlow, Donnamarie Poag, MD  Propylene Glycol (SYSTANE BALANCE OP) Place 1 drop into both eyes 2 (two) times daily.    Yes [provider]  triamcinolone cream (KENALOG) 0.1 % Apply 1 application topically 2 (two) times daily. Patient taking differently: Apply 1 application topically 2 (two)  times daily as needed (for eczema).  03/18/18  Yes Kozlow, Donnamarie Poag, MD  trimethoprim-polymyxin b (POLYTRIM) ophthalmic solution Place 1 drop into the right eye every 6 (six) hours. 12/18/18  Yes Hawks, Christy A, [name]    ROS:  Out of a complete 14 system review of symptoms, the patient complains only of the following symptoms, and all other reviewed systems are negative.  Seizures Walking difficulty  Blood pressure (!) 162/83, pulse 99, temperature (!) 97.2 F (36.2 C), height 5\' 4"  (1.626 m), weight 224 lb (101.6 kg).  Physical Exam  General: The patient is alert and cooperative at the time of the examination.  The patient is markedly obese.  Skin: No significant peripheral edema is noted.   Neurologic Exam  Mental status: The patient is alert and oriented x 3 at the time of the examination. The patient has apparent normal recent and remote memory, with an apparently normal attention span and concentration ability.   Cranial nerves: Facial symmetry is not present.  Ptosis of the right eye is noted.  Speech is normal, no aphasia or dysarthria is noted. Extraocular movements are full. Visual fields are full.  Motor: The patient has good strength in all 4 extremities.  Sensory examination: Soft touch sensation is symmetric on the face, arms, and legs.  Coordination: The patient has good finger-nose-finger and heel-to-shin bilaterally.  Gait and station: The patient has some difficulty arising from a seated position.  Once up, she can walk with a cane, gait is slightly wide-based.  Romberg is negative.  Reflexes: Deep tendon reflexes are symmetric.   Assessment/Plan:  1.  Partial complex seizures, poorly controlled  The patient will be placed on low-dose Depakote starting at 250 mg twice daily for 2 weeks and then go to 500 mg twice daily.  Continue the [place] for now.  She will follow-up in 6 months.  I have spent a lot of time during this visit encouraging the patient to get her  Covid vaccination.  Greater than 50% of the visit was spent in counseling and coordination of care.  Face-to-face time with the patient was 22 minutes.   Jill Alexanders MD 07/28/2019 10:40 AM  [facility] [address] [place], [place] [address]  Phone [phone] Fax [phone]

## 2019-08-04 ENCOUNTER — Other Ambulatory Visit: Payer: Self-pay | Admitting: Family

## 2019-08-04 ENCOUNTER — Telehealth: Payer: Self-pay | Admitting: Family

## 2019-08-04 DIAGNOSIS — H02401 Unspecified ptosis of right eyelid: Secondary | ICD-10-CM | POA: Diagnosis not present

## 2019-08-04 NOTE — Telephone Encounter (Signed)
Daughter also states that Dr. Eugenie Birks wanted to put her on Divalprex 250mg  bid she is not giving it to her yet because BP are to low. Aslo the eye doctor wanted to put her on something but does not want her to take until she gets with you on her BP. Does she need an appointment to come in?

## 2019-08-04 NOTE — Telephone Encounter (Signed)
Pt should hold metoprolol and please make her an appointment to be seen.

## 2019-08-04 NOTE — Telephone Encounter (Signed)
BP are running 110/80,114/72. Please advise. Patient usally runs 130/98.   04/03  128/72 am 04/04  72/80am

## 2019-08-05 NOTE — Telephone Encounter (Signed)
Daughter aware to stop metoprolol and an appointment was scheduled.

## 2019-08-10 ENCOUNTER — Other Ambulatory Visit: Payer: Self-pay

## 2019-08-10 ENCOUNTER — Ambulatory Visit: Payer: Medicare PPO | Admitting: Family

## 2019-08-10 ENCOUNTER — Encounter: Payer: Self-pay | Admitting: Family

## 2019-08-10 VITALS — BP 147/83 | HR 87 | Temp 97.8°F | Ht 64.0 in | Wt 223.2 lb

## 2019-08-10 DIAGNOSIS — I959 Hypotension, unspecified: Secondary | ICD-10-CM | POA: Diagnosis not present

## 2019-08-10 DIAGNOSIS — I10 Essential (primary) hypertension: Secondary | ICD-10-CM

## 2019-08-10 DIAGNOSIS — K5900 Constipation, unspecified: Secondary | ICD-10-CM | POA: Diagnosis not present

## 2019-08-10 NOTE — Progress Notes (Signed)
Subjective:    Patient ID: [name], female    DOB: May 20, 1923, 86 y.o.   MRN: [id number]  Chief Complaint  Patient presents with  . Hypotension    Patient states that it has been low for the last few weeks and she stopped taking the metoprolol x 1 week    HPI Pt presents to the office today with hypotension at home. She reports when she was taking her BP at home and her BP was 110/80, 114/72, 128/72, 72/80. She held her metoprolol 12.5 mg BID. She reports she had dizziness.   Her BP today is slightly elevated. Reports mild swelling in her ankles, fatigue, weakness   She is complaining of constipation. Reports she has 1 BM a week and takes Miralax.    Review of Systems  All other systems reviewed and are negative.      Objective:   Physical Exam Vitals reviewed.  Constitutional:      General: She is not in acute distress.    Appearance: She is well-developed. She is obese.  HENT:     Head: Normocephalic and atraumatic.     Right Ear: Tympanic membrane normal.     Left Ear: Tympanic membrane normal.  Eyes:     Pupils: Pupils are equal, round, and reactive to light.  Neck:     Thyroid: No thyromegaly.  Cardiovascular:     Rate and Rhythm: Normal rate and regular rhythm.     Heart sounds: Normal heart sounds. No murmur.  Pulmonary:     Effort: Pulmonary effort is normal. No respiratory distress.     Breath sounds: Normal breath sounds. No wheezing.  Abdominal:     General: Bowel sounds are normal. There is no distension.     Palpations: Abdomen is soft.     Tenderness: There is no abdominal tenderness.  Musculoskeletal:        General: No tenderness. Normal range of motion.     Cervical back: Normal range of motion and neck supple.  Skin:    General: Skin is warm and dry.  Neurological:     Mental Status: She is alert and oriented to person, place, and time.     Cranial Nerves: No cranial nerve deficit.     Deep Tendon Reflexes: Reflexes are normal and  symmetric.  Psychiatric:        Behavior: Behavior normal.        Thought Content: Thought content normal.        Judgment: Judgment normal.       BP (!) 153/88   Pulse 87   Temp 97.8 F (36.6 C) (Temporal)   Ht '5\' 4"'$  (1.626 m)   Wt 223 lb 3.2 oz (101.2 kg)   SpO2 99%   BMI 38.31 kg/m      Assessment & Plan:  [name] comes in today with chief complaint of Hypotension (Patient states that it has been low for the last few weeks and she stopped taking the metoprolol x 1 week)   Diagnosis and orders addressed:  1. Hypotension, unspecified hypotension type Resolved since stopping Metoprolol Continue to hold metoprolol  - BMP8+EGFR  2. Constipation, unspecified constipation type Continue Miralax daily Increase fiber rich diet - BMP8+EGFR  3. Essential hypertension Stable, given age and hypotension at home we will hold off on any medications at this time -Dash diet information given -Exercise encouraged - Stress Management  -Continue current meds -RTO in 6 months   - BMP8+EGFR  [name], [name]

## 2019-08-10 NOTE — Patient Instructions (Signed)

## 2019-08-11 LAB — BMP8+EGFR
BUN/Creatinine Ratio: 20 (ref 12–28)
BUN: 29 mg/dL (ref 10–36)
CO2: 27 mmol/L (ref 20–29)
Calcium: 10.4 mg/dL — ABNORMAL HIGH (ref 8.7–10.3)
Chloride: 100 mmol/L (ref 96–106)
Creatinine, Ser: 1.42 mg/dL — ABNORMAL HIGH (ref 0.57–1.00)
GFR calc Af Amer: 36 mL/min/{1.73_m2} — ABNORMAL LOW (ref >59)
GFR calc non Af Amer: 32 mL/min/{1.73_m2} — ABNORMAL LOW (ref >59)
Glucose: 80 mg/dL (ref 65–99)
Potassium: 4.6 mmol/L (ref 3.5–5.2)
Sodium: 139 mmol/L (ref 134–144)

## 2019-08-17 ENCOUNTER — Other Ambulatory Visit: Payer: Self-pay | Admitting: Family

## 2019-08-18 DIAGNOSIS — H02401 Unspecified ptosis of right eyelid: Secondary | ICD-10-CM | POA: Diagnosis not present

## 2019-09-23 ENCOUNTER — Other Ambulatory Visit: Payer: Self-pay | Admitting: Family Medicine

## 2019-09-25 ENCOUNTER — Other Ambulatory Visit: Payer: Self-pay | Admitting: Family

## 2019-10-20 ENCOUNTER — Other Ambulatory Visit: Payer: Self-pay | Admitting: Neurology

## 2019-10-25 ENCOUNTER — Other Ambulatory Visit: Payer: Self-pay | Admitting: Allergy and Immunology

## 2019-11-10 ENCOUNTER — Other Ambulatory Visit: Payer: Self-pay | Admitting: Neurology

## 2019-11-12 ENCOUNTER — Other Ambulatory Visit: Payer: Self-pay | Admitting: Neurology

## 2019-11-18 ENCOUNTER — Other Ambulatory Visit: Payer: Self-pay | Admitting: Allergy and Immunology

## 2019-11-19 DIAGNOSIS — D631 Anemia in chronic kidney disease: Secondary | ICD-10-CM | POA: Diagnosis not present

## 2019-11-19 DIAGNOSIS — I129 Hypertensive chronic kidney disease with stage 1 through stage 4 chronic kidney disease, or unspecified chronic kidney disease: Secondary | ICD-10-CM | POA: Diagnosis not present

## 2019-11-19 DIAGNOSIS — N189 Chronic kidney disease, unspecified: Secondary | ICD-10-CM | POA: Diagnosis not present

## 2019-11-19 DIAGNOSIS — N183 Chronic kidney disease, stage 3 unspecified: Secondary | ICD-10-CM | POA: Diagnosis not present

## 2019-11-19 DIAGNOSIS — N2581 Secondary hyperparathyroidism of renal origin: Secondary | ICD-10-CM | POA: Diagnosis not present

## 2019-11-30 ENCOUNTER — Other Ambulatory Visit: Payer: Self-pay | Admitting: Allergy and Immunology

## 2019-12-01 ENCOUNTER — Other Ambulatory Visit: Payer: Self-pay | Admitting: Allergy and Immunology

## 2019-12-03 ENCOUNTER — Telehealth: Payer: Self-pay | Admitting: Allergy and Immunology

## 2019-12-03 ENCOUNTER — Other Ambulatory Visit: Payer: Self-pay | Admitting: Allergy and Immunology

## 2019-12-03 MED ORDER — ARNUITY ELLIPTA 200 MCG/ACT IN AEPB: INHALATION_SPRAY | RESPIRATORY_TRACT | 2 refills | Status: AC

## 2019-12-03 NOTE — Telephone Encounter (Signed)
Patient's daughter came into the office to make an appointment because patient is in need of a refill on inhaler, arnuity. First appointment available with Dr. Neldon Mc is 01/19/2020. Patient's daughter would like to know if a refill could be called in before her appointment. Daughter states a courtesy refill had not been given yet, but it seems a courtesy refill was given.  Please advise.

## 2019-12-03 NOTE — Telephone Encounter (Signed)
I have sent in enough to get her through until she is seen in the office. Daughter informed.

## 2019-12-28 ENCOUNTER — Other Ambulatory Visit: Payer: Self-pay

## 2019-12-28 ENCOUNTER — Encounter: Payer: Self-pay | Admitting: Family Medicine

## 2019-12-28 ENCOUNTER — Ambulatory Visit (INDEPENDENT_AMBULATORY_CARE_PROVIDER_SITE_OTHER): Payer: Medicare PPO | Admitting: Family Medicine

## 2019-12-28 VITALS — BP 149/81 | HR 97 | Temp 98.0°F | Ht 64.0 in | Wt 214.0 lb

## 2019-12-28 DIAGNOSIS — B370 Candidal stomatitis: Secondary | ICD-10-CM | POA: Diagnosis not present

## 2019-12-28 MED ORDER — NYSTATIN 100000 UNIT/ML MT SUSP: 5.0000 mL | Freq: Four times a day (QID) | OROMUCOSAL | 1 refills | Status: DC

## 2019-12-28 NOTE — Progress Notes (Signed)
BP (!) 149/81   Pulse 97   Temp 98 F (36.7 C)   Ht 5\' 4"  (1.626 m)   Wt 214 lb (97.1 kg)   SpO2 96%   BMI 36.73 kg/m    Subjective:   Patient ID: [name], female    DOB: [birth year], 86 y.o.   MRN: [id number]  HPI: [name] is a 86 y.o. female presenting on 12/28/2019 for Mouth Lesions (white spots inside mouth. On lips and cheeks) and Fatigue (present x few weeks)   HPI Patient is coming in and being brought in by her daughter with complaints of sores and white spots in her mouth that have been going on over the past couple weeks, she has gotten these on and off over the past 6 or 7 months.  She also has been feeling more weakness and fatigue over the past 6 or 7 months.  She does admit she is not been eating as well because she has been fighting on and off these sores in her mouth.  She did try some Biotene mouthwash but has not tried anything else at this point.  She denies any fevers or chills or cough or congestion.  Relevant past medical, surgical, family and social history reviewed and updated as we likely were not seen indicated. Interim medical history since our last visit reviewed. Allergies and medications reviewed and updated.  Review of Systems  Constitutional: Negative for chills and fever.  HENT: Positive for mouth sores. Negative for congestion, ear discharge and ear pain.   Eyes: Negative for visual disturbance.  Respiratory: Negative for cough, chest tightness and shortness of breath.   Cardiovascular: Negative for chest pain and leg swelling.  Genitourinary: Negative for difficulty urinating and dysuria.  Musculoskeletal: Negative for back pain and gait problem.  Skin: Negative for rash.  Neurological: Negative for light-headedness and headaches.  Psychiatric/Behavioral: Negative for agitation and behavioral problems.  All other systems reviewed and are negative.   Per HPI unless specifically indicated above   Allergies as of 12/28/2019       Reactions   Advicor [niacin-lovastatin Er] Other (See Comments)   Reaction unknown to daughter   Caduet [amlodipine-atorvastatin] Other (See Comments)   Reaction unknown to daughter   Codeine Other (See Comments)   Reaction unknown to daughter   Dilantin [phenytoin] Other (See Comments)   Reaction unknown to daughter   Lescol [fluvastatin] Other (See Comments)   Reaction unknown to daughter   Pamelor [nortriptyline] Other (See Comments)   Reaction unknown to daughter   Penicillins Hives   Did it involve swelling of the face/tongue/throat, SOB, or low BP? Unk Did it involve sudden or severe rash/hives, skin peeling, or any reaction on the inside of your mouth or nose? Unk Did you need to seek medical attention at a hospital or doctor's office? Unk When did it last happen? "it was a long time ago" If all above answers are "NO", may proceed with cephalosporin use.   Sulfa Antibiotics Hives   Trileptal [oxcarbazepine] Other (See Comments)   Reaction unknown to daughter   Ultram [tramadol] Other (See Comments)   Reaction unknown to daughter   Welchol [colesevelam] Other (See Comments)   Reaction unknown to daughter   Latex Rash   Tomato Rash      Medication List       Accurate as of December 28, 2019 11:07 AM. If you have any questions, ask your nurse or doctor.  acetaminophen 500 MG tablet Commonly known as: TYLENOL Take 500-1,000 mg by mouth every 8 (eight) hours as needed for mild pain or headache.   allopurinol 100 MG tablet Commonly known as: ZYLOPRIM TAKE 1/2 TABLET BY MOUTH EVERY DAY   Arnuity Ellipta 200 MCG/ACT Aepb Generic drug: Fluticasone Furoate INHALE 1 PUFF BY MOUTH EVERY DAY   aspirin 81 MG tablet Take 81 mg by mouth daily.   cetirizine 10 MG tablet Commonly known as: ZYRTEC TAKE 1 TABLET BY MOUTH EVERY DAY   divalproex 250 MG DR tablet Commonly known as: DEPAKOTE TAKE ONE TABLET BY MOUTH TWICE A DAY FOR 2 WEEKS, THEN TAKE 2 TABLETS TWICE A  DAY THEREAFTER   famotidine 40 MG tablet Commonly known as: PEPCID Take 1 tablet (40 mg total) by mouth daily.   ketoconazole 2 % cream Commonly known as: NIZORAL Apply to the affected areas underneath breasts, belly and in groin region ONCE daily x2-4 weeks. What changed:   how much to take  how to take this  when to take this  additional instructions   levETIRAcetam 500 MG 24 hr tablet Commonly known as: KEPPRA XR Take 4 tablets (2,000 mg total) by mouth at bedtime.   metolazone 2.5 MG tablet Commonly known as: ZAROXOLYN Take 1 Tablet by mouth on Mondays, Wednesdays and Fridays   metoprolol tartrate 25 MG tablet Commonly known as: LOPRESSOR Take 0.5 tablets (12.5 mg total) by mouth 2 (two) times daily. (Needs to be seen before next refill)   mometasone 0.1 % ointment Commonly known as: ELOCON Apply to skin once daily as directed after bathing.   montelukast 10 MG tablet Commonly known as: SINGULAIR TAKE 1 TABLET BY MOUTH EVERYDAY AT BEDTIME   nystatin 100000 UNIT/ML suspension Commonly known as: MYCOSTATIN Take 5 mLs (500,000 Units total) by mouth 4 (four) times daily. Started by: Worthy Rancher, MD   nystatin-triamcinolone ointment Commonly known as: MYCOLOG Apply 1 application topically 2 (two) times daily.   omeprazole 40 MG capsule Commonly known as: PriLOSEC Take 1 capsule (40 mg total) by mouth daily. What changed:   when to take this  reasons to take this   One-A-Day Womens 50 Plus Tabs Take 1 tablet by mouth daily with breakfast.   SYSTANE BALANCE OP Place 1 drop into both eyes 2 (two) times daily.   triamcinolone cream 0.1 % Commonly known as: KENALOG Apply 1 application topically 2 (two) times daily. What changed:   when to take this  reasons to take this   trimethoprim-polymyxin b ophthalmic solution Commonly known as: Polytrim Place 1 drop into the right eye every 6 (six) hours.        Objective:   BP (!) 149/81    Pulse 97   Temp 98 F (36.7 C)   Ht 5\' 4"  (1.626 m)   Wt 214 lb (97.1 kg)   SpO2 96%   BMI 36.73 kg/m   Wt Readings from Last 3 Encounters:  12/28/19 214 lb (97.1 kg)  08/10/19 223 lb 3.2 oz (101.2 kg)  07/28/19 224 lb (101.6 kg)    Physical Exam Vitals and nursing note reviewed.  Constitutional:      General: She is not in acute distress.    Appearance: She is well-developed. She is not diaphoretic.  HENT:     Mouth/Throat:     Dentition: Has dentures.     Comments: White exudate under gums and in posterior oropharynx and on the inside of lips. Eyes:  Conjunctiva/sclera: Conjunctivae normal.  Skin:    General: Skin is warm and dry.     Findings: No rash.  Neurological:     Mental Status: She is alert and oriented to person, place, and time.     Coordination: Coordination normal.  Psychiatric:        Behavior: Behavior normal.       Assessment & Plan:   Problem List Items Addressed This Visit    None    Visit Diagnoses    Oral thrush    -  Primary   Relevant Medications   nystatin (MYCOSTATIN) 100000 UNIT/ML suspension    Patient has thrush likely due to dentures, recommended for her to put them in boiling water along with her regular cleaning routine while she is doing the treatment, sent nystatin for her.  Likely because she is not eating as much she is feeling more weak.  Follow up plan: Return if symptoms worsen or fail to improve.  Counseling provided for all of the vaccine components No orders of the defined types were placed in this encounter.   Caryl Pina, MD [facility] 12/28/2019, 11:07 AM

## 2020-01-04 ENCOUNTER — Inpatient Hospital Stay (HOSPITAL_COMMUNITY)
Admission: EM | Admit: 2020-01-04 | Discharge: 2020-01-07 | DRG: 871 | Disposition: A | Discharge status: Home Health | Payer: Medicare PPO | Attending: Internal Medicine | Admitting: Internal Medicine

## 2020-01-04 ENCOUNTER — Emergency Department (HOSPITAL_COMMUNITY): Payer: Medicare PPO

## 2020-01-04 ENCOUNTER — Encounter (HOSPITAL_COMMUNITY): Payer: Self-pay | Admitting: Emergency Medicine

## 2020-01-04 DIAGNOSIS — I129 Hypertensive chronic kidney disease with stage 1 through stage 4 chronic kidney disease, or unspecified chronic kidney disease: Secondary | ICD-10-CM | POA: Diagnosis present

## 2020-01-04 DIAGNOSIS — Z20822 Contact with and (suspected) exposure to covid-19: Secondary | ICD-10-CM | POA: Diagnosis not present

## 2020-01-04 DIAGNOSIS — J189 Pneumonia, unspecified organism: Secondary | ICD-10-CM | POA: Diagnosis present

## 2020-01-04 DIAGNOSIS — Z880 Allergy status to penicillin: Secondary | ICD-10-CM

## 2020-01-04 DIAGNOSIS — Z23 Encounter for immunization: Secondary | ICD-10-CM | POA: Diagnosis not present

## 2020-01-04 DIAGNOSIS — M25512 Pain in left shoulder: Secondary | ICD-10-CM | POA: Diagnosis present

## 2020-01-04 DIAGNOSIS — Z743 Need for continuous supervision: Secondary | ICD-10-CM | POA: Diagnosis not present

## 2020-01-04 DIAGNOSIS — B370 Candidal stomatitis: Secondary | ICD-10-CM | POA: Diagnosis not present

## 2020-01-04 DIAGNOSIS — Z886 Allergy status to analgesic agent status: Secondary | ICD-10-CM

## 2020-01-04 DIAGNOSIS — J45909 Unspecified asthma, uncomplicated: Secondary | ICD-10-CM | POA: Diagnosis present

## 2020-01-04 DIAGNOSIS — Z6838 Body mass index (BMI) 38.0-38.9, adult: Secondary | ICD-10-CM | POA: Diagnosis not present

## 2020-01-04 DIAGNOSIS — Z888 Allergy status to other drugs, medicaments and biological substances status: Secondary | ICD-10-CM

## 2020-01-04 DIAGNOSIS — R918 Other nonspecific abnormal finding of lung field: Secondary | ICD-10-CM | POA: Diagnosis present

## 2020-01-04 DIAGNOSIS — E669 Obesity, unspecified: Secondary | ICD-10-CM | POA: Diagnosis present

## 2020-01-04 DIAGNOSIS — R6520 Severe sepsis without septic shock: Secondary | ICD-10-CM | POA: Diagnosis present

## 2020-01-04 DIAGNOSIS — R269 Unspecified abnormalities of gait and mobility: Secondary | ICD-10-CM | POA: Diagnosis present

## 2020-01-04 DIAGNOSIS — I471 Supraventricular tachycardia: Secondary | ICD-10-CM | POA: Diagnosis not present

## 2020-01-04 DIAGNOSIS — I700 Atherosclerosis of aorta: Secondary | ICD-10-CM | POA: Diagnosis not present

## 2020-01-04 DIAGNOSIS — R05 Cough: Secondary | ICD-10-CM | POA: Diagnosis not present

## 2020-01-04 DIAGNOSIS — J181 Lobar pneumonia, unspecified organism: Secondary | ICD-10-CM | POA: Diagnosis present

## 2020-01-04 DIAGNOSIS — Z91040 Latex allergy status: Secondary | ICD-10-CM

## 2020-01-04 DIAGNOSIS — A419 Sepsis, unspecified organism: Principal | ICD-10-CM | POA: Diagnosis present

## 2020-01-04 DIAGNOSIS — R531 Weakness: Secondary | ICD-10-CM | POA: Diagnosis not present

## 2020-01-04 DIAGNOSIS — M109 Gout, unspecified: Secondary | ICD-10-CM | POA: Diagnosis present

## 2020-01-04 DIAGNOSIS — Z8249 Family history of ischemic heart disease and other diseases of the circulatory system: Secondary | ICD-10-CM

## 2020-01-04 DIAGNOSIS — R000 Tachycardia, unspecified: Secondary | ICD-10-CM | POA: Diagnosis not present

## 2020-01-04 DIAGNOSIS — I10 Essential (primary) hypertension: Secondary | ICD-10-CM | POA: Diagnosis not present

## 2020-01-04 DIAGNOSIS — H109 Unspecified conjunctivitis: Secondary | ICD-10-CM | POA: Diagnosis present

## 2020-01-04 DIAGNOSIS — N1832 Chronic kidney disease, stage 3b: Secondary | ICD-10-CM | POA: Diagnosis present

## 2020-01-04 DIAGNOSIS — J690 Pneumonitis due to inhalation of food and vomit: Secondary | ICD-10-CM | POA: Diagnosis present

## 2020-01-04 DIAGNOSIS — Z7982 Long term (current) use of aspirin: Secondary | ICD-10-CM

## 2020-01-04 DIAGNOSIS — K449 Diaphragmatic hernia without obstruction or gangrene: Secondary | ICD-10-CM | POA: Diagnosis not present

## 2020-01-04 DIAGNOSIS — Z885 Allergy status to narcotic agent status: Secondary | ICD-10-CM

## 2020-01-04 DIAGNOSIS — G8929 Other chronic pain: Secondary | ICD-10-CM | POA: Diagnosis present

## 2020-01-04 DIAGNOSIS — Z833 Family history of diabetes mellitus: Secondary | ICD-10-CM

## 2020-01-04 DIAGNOSIS — Z8673 Personal history of transient ischemic attack (TIA), and cerebral infarction without residual deficits: Secondary | ICD-10-CM

## 2020-01-04 DIAGNOSIS — R519 Headache, unspecified: Secondary | ICD-10-CM | POA: Diagnosis present

## 2020-01-04 DIAGNOSIS — J4530 Mild persistent asthma, uncomplicated: Secondary | ICD-10-CM | POA: Diagnosis not present

## 2020-01-04 DIAGNOSIS — E785 Hyperlipidemia, unspecified: Secondary | ICD-10-CM | POA: Diagnosis present

## 2020-01-04 DIAGNOSIS — N183 Chronic kidney disease, stage 3 unspecified: Secondary | ICD-10-CM

## 2020-01-04 DIAGNOSIS — Z882 Allergy status to sulfonamides status: Secondary | ICD-10-CM

## 2020-01-04 DIAGNOSIS — N179 Acute kidney failure, unspecified: Secondary | ICD-10-CM | POA: Diagnosis not present

## 2020-01-04 DIAGNOSIS — M545 Low back pain: Secondary | ICD-10-CM | POA: Diagnosis present

## 2020-01-04 DIAGNOSIS — R296 Repeated falls: Secondary | ICD-10-CM | POA: Diagnosis present

## 2020-01-04 DIAGNOSIS — Z79899 Other long term (current) drug therapy: Secondary | ICD-10-CM

## 2020-01-04 DIAGNOSIS — I499 Cardiac arrhythmia, unspecified: Secondary | ICD-10-CM | POA: Diagnosis not present

## 2020-01-04 DIAGNOSIS — K648 Other hemorrhoids: Secondary | ICD-10-CM | POA: Diagnosis present

## 2020-01-04 DIAGNOSIS — G40909 Epilepsy, unspecified, not intractable, without status epilepticus: Secondary | ICD-10-CM

## 2020-01-04 DIAGNOSIS — K219 Gastro-esophageal reflux disease without esophagitis: Secondary | ICD-10-CM | POA: Diagnosis present

## 2020-01-04 DIAGNOSIS — I444 Left anterior fascicular block: Secondary | ICD-10-CM | POA: Diagnosis present

## 2020-01-04 DIAGNOSIS — E66812 Obesity, class 2: Secondary | ICD-10-CM

## 2020-01-04 DIAGNOSIS — Z91018 Allergy to other foods: Secondary | ICD-10-CM

## 2020-01-04 DIAGNOSIS — G9519 Other vascular myelopathies: Secondary | ICD-10-CM

## 2020-01-04 DIAGNOSIS — I69398 Other sequelae of cerebral infarction: Secondary | ICD-10-CM | POA: Diagnosis not present

## 2020-01-04 DIAGNOSIS — M1990 Unspecified osteoarthritis, unspecified site: Secondary | ICD-10-CM | POA: Diagnosis present

## 2020-01-04 DIAGNOSIS — E860 Dehydration: Secondary | ICD-10-CM | POA: Diagnosis present

## 2020-01-04 LAB — CBC
HCT: 31.6 % — ABNORMAL LOW (ref 36.0–46.0)
Hemoglobin: 10 g/dL — ABNORMAL LOW (ref 12.0–15.0)
MCH: 23.4 pg — ABNORMAL LOW (ref 26.0–34.0)
MCHC: 31.6 g/dL (ref 30.0–36.0)
MCV: 73.8 fL — ABNORMAL LOW (ref 80.0–100.0)
Platelets: 924 10*3/uL (ref 150–400)
RBC: 4.28 MIL/uL (ref 3.87–5.11)
RDW: 19.4 % — ABNORMAL HIGH (ref 11.5–15.5)
WBC: 20.3 10*3/uL — ABNORMAL HIGH (ref 4.0–10.5)
nRBC: 0.3 % — ABNORMAL HIGH (ref 0.0–0.2)

## 2020-01-04 LAB — BASIC METABOLIC PANEL
Anion gap: 14 (ref 5–15)
BUN: 33 mg/dL — ABNORMAL HIGH (ref 8–23)
CO2: 25 mmol/L (ref 22–32)
Calcium: 10.1 mg/dL (ref 8.9–10.3)
Chloride: 99 mmol/L (ref 98–111)
Creatinine, Ser: 1.72 mg/dL — ABNORMAL HIGH (ref 0.44–1.00)
GFR calc Af Amer: 29 mL/min — ABNORMAL LOW (ref >60)
GFR calc non Af Amer: 25 mL/min — ABNORMAL LOW (ref >60)
Glucose, Bld: 107 mg/dL — ABNORMAL HIGH (ref 70–99)
Potassium: 3.8 mmol/L (ref 3.5–5.1)
Sodium: 138 mmol/L (ref 135–145)

## 2020-01-04 LAB — TSH: TSH: 1.265 u[IU]/mL (ref 0.350–4.500)

## 2020-01-04 LAB — SARS CORONAVIRUS 2 BY RT PCR (HOSPITAL ORDER, PERFORMED IN ~~LOC~~ HOSPITAL LAB): SARS Coronavirus 2: NEGATIVE

## 2020-01-04 MED ORDER — SODIUM CHLORIDE 0.9 % IV SOLN
2.0000 g | INTRAVENOUS | Status: DC
  Administered 2020-01-04 – 2020-01-06 (×3): 2 g via INTRAVENOUS
  Filled 2020-01-04 (×3): qty 20

## 2020-01-04 MED ORDER — ACETAMINOPHEN 325 MG PO TABS
650.0000 mg | ORAL_TABLET | Freq: Four times a day (QID) | ORAL | Status: DC | PRN
  Administered 2020-01-04 – 2020-01-06 (×3): 650 mg via ORAL
  Filled 2020-01-04 (×3): qty 2

## 2020-01-04 MED ORDER — BUDESONIDE 0.25 MG/2ML IN SUSP
2.0000 mL | Freq: Two times a day (BID) | RESPIRATORY_TRACT | Status: DC
  Filled 2020-01-04: qty 2

## 2020-01-04 MED ORDER — SODIUM CHLORIDE 0.9 % IV SOLN
2.0000 g | Freq: Once | INTRAVENOUS | Status: AC
  Administered 2020-01-04: 2 g via INTRAVENOUS
  Filled 2020-01-04: qty 2

## 2020-01-04 MED ORDER — LEVETIRACETAM ER 500 MG PO TB24
2000.0000 mg | ORAL_TABLET | Freq: Every day | ORAL | Status: DC
  Administered 2020-01-05 – 2020-01-06 (×3): 2000 mg via ORAL
  Filled 2020-01-04 (×4): qty 4

## 2020-01-04 MED ORDER — VANCOMYCIN HCL 2000 MG/400ML IV SOLN
2000.0000 mg | Freq: Once | INTRAVENOUS | Status: DC
  Filled 2020-01-04: qty 400

## 2020-01-04 MED ORDER — SODIUM CHLORIDE 0.9 % IV BOLUS
1000.0000 mL | Freq: Once | INTRAVENOUS | Status: AC
  Administered 2020-01-04: 1000 mL via INTRAVENOUS

## 2020-01-04 MED ORDER — ENOXAPARIN SODIUM 30 MG/0.3ML ~~LOC~~ SOLN
30.0000 mg | SUBCUTANEOUS | Status: DC
  Administered 2020-01-04 – 2020-01-05 (×2): 30 mg via SUBCUTANEOUS
  Filled 2020-01-04 (×2): qty 0.3

## 2020-01-04 MED ORDER — ACETAMINOPHEN 650 MG RE SUPP: 650.0000 mg | Freq: Four times a day (QID) | RECTAL | Status: DC | PRN

## 2020-01-04 MED ORDER — ADULT MULTIVITAMIN W/MINERALS CH
1.0000 | ORAL_TABLET | Freq: Every day | ORAL | Status: DC
  Administered 2020-01-05 – 2020-01-07 (×2): 1 via ORAL
  Filled 2020-01-04 (×2): qty 1

## 2020-01-04 MED ORDER — ONDANSETRON HCL 4 MG/2ML IJ SOLN
4.0000 mg | Freq: Four times a day (QID) | INTRAMUSCULAR | Status: DC | PRN
  Administered 2020-01-04 – 2020-01-06 (×2): 4 mg via INTRAVENOUS
  Filled 2020-01-04 (×2): qty 2

## 2020-01-04 MED ORDER — ALLOPURINOL 100 MG PO TABS
50.0000 mg | ORAL_TABLET | Freq: Every day | ORAL | Status: DC
  Administered 2020-01-04 – 2020-01-07 (×3): 50 mg via ORAL
  Filled 2020-01-04: qty 0.5
  Filled 2020-01-04: qty 1
  Filled 2020-01-04: qty 0.5
  Filled 2020-01-04: qty 1
  Filled 2020-01-04: qty 0.5

## 2020-01-04 MED ORDER — ONDANSETRON HCL 4 MG PO TABS: 4.0000 mg | ORAL_TABLET | Freq: Four times a day (QID) | ORAL | Status: DC | PRN

## 2020-01-04 MED ORDER — VANCOMYCIN HCL 1500 MG/300ML IV SOLN: 1500.0000 mg | INTRAVENOUS | Status: DC

## 2020-01-04 MED ORDER — DEXTROSE-NACL 5-0.9 % IV SOLN: INTRAVENOUS | Status: DC

## 2020-01-04 MED ORDER — DIVALPROEX SODIUM 250 MG PO DR TAB
500.0000 mg | DELAYED_RELEASE_TABLET | Freq: Two times a day (BID) | ORAL | Status: DC
  Administered 2020-01-04 – 2020-01-07 (×5): 500 mg via ORAL
  Filled 2020-01-04 (×5): qty 2

## 2020-01-04 MED ORDER — POLYMYXIN B-TRIMETHOPRIM 10000-0.1 UNIT/ML-% OP SOLN
1.0000 [drp] | Freq: Four times a day (QID) | OPHTHALMIC | Status: DC
  Administered 2020-01-04 – 2020-01-07 (×6): 1 [drp] via OPHTHALMIC
  Filled 2020-01-04 (×2): qty 10

## 2020-01-04 MED ORDER — BUDESONIDE 0.25 MG/2ML IN SUSP
2.0000 mL | Freq: Two times a day (BID) | RESPIRATORY_TRACT | Status: DC
  Administered 2020-01-04 – 2020-01-07 (×6): 0.25 mg via RESPIRATORY_TRACT
  Filled 2020-01-04 (×5): qty 2

## 2020-01-04 MED ORDER — ASPIRIN EC 81 MG PO TBEC
81.0000 mg | DELAYED_RELEASE_TABLET | Freq: Every day | ORAL | Status: DC
  Administered 2020-01-04 – 2020-01-07 (×3): 81 mg via ORAL
  Filled 2020-01-04 (×3): qty 1

## 2020-01-04 MED ORDER — NYSTATIN 100000 UNIT/ML MT SUSP
5.0000 mL | Freq: Four times a day (QID) | OROMUCOSAL | Status: DC
  Administered 2020-01-04 – 2020-01-07 (×7): 500000 [IU] via ORAL
  Filled 2020-01-04 (×7): qty 5

## 2020-01-04 MED ORDER — METOPROLOL TARTRATE 50 MG PO TABS
50.0000 mg | ORAL_TABLET | Freq: Two times a day (BID) | ORAL | Status: DC
  Administered 2020-01-04 – 2020-01-05 (×3): 50 mg via ORAL
  Filled 2020-01-04 (×4): qty 1

## 2020-01-04 MED ORDER — MONTELUKAST SODIUM 10 MG PO TABS
10.0000 mg | ORAL_TABLET | Freq: Every day | ORAL | Status: DC
  Administered 2020-01-04 – 2020-01-06 (×3): 10 mg via ORAL
  Filled 2020-01-04 (×3): qty 1

## 2020-01-04 MED ORDER — METRONIDAZOLE IN NACL 5-0.79 MG/ML-% IV SOLN
500.0000 mg | Freq: Three times a day (TID) | INTRAVENOUS | Status: DC
  Administered 2020-01-04 – 2020-01-07 (×9): 500 mg via INTRAVENOUS
  Filled 2020-01-04 (×9): qty 100

## 2020-01-04 MED ORDER — PANTOPRAZOLE SODIUM 40 MG PO TBEC
40.0000 mg | DELAYED_RELEASE_TABLET | Freq: Every day | ORAL | Status: DC
  Administered 2020-01-04 – 2020-01-07 (×3): 40 mg via ORAL
  Filled 2020-01-04 (×3): qty 1

## 2020-01-04 MED ORDER — METOPROLOL TARTRATE 5 MG/5ML IV SOLN
5.0000 mg | Freq: Once | INTRAVENOUS | Status: AC
  Administered 2020-01-06: 5 mg via INTRAVENOUS
  Filled 2020-01-04: qty 5

## 2020-01-04 MED ORDER — LORATADINE 10 MG PO TABS
10.0000 mg | ORAL_TABLET | Freq: Every day | ORAL | Status: DC
  Administered 2020-01-04 – 2020-01-07 (×3): 10 mg via ORAL
  Filled 2020-01-04 (×3): qty 1

## 2020-01-04 NOTE — ED Notes (Signed)
Date and time results received: 01/04/20 1523  Test: Platelets  Critical Value: 924  Name of Provider Notified: Domenic Moras  Orders Received? Or Actions Taken?: n/a

## 2020-01-04 NOTE — Progress Notes (Signed)
Pharmacy Antibiotic Note  [name] is a 86 y.o. female admitted on 01/04/2020 with pneumonia.  Pharmacy has been consulted for Vancomycin dosing.  Plan: Vancomycin 2000 mg IV x 1 dose. Vancomycin 1500 mg IV every 48 hours. Expected AUC 525. Cefepime 2000 mg IV x 1 dose- F/U continuation Monitor labs, c/s, and vanco level as indicated.     Temp (24hrs), Avg:98.1 F (36.7 C), Min:98 F (36.7 C), Max:98.3 F (36.8 C)  Recent Labs  Lab 01/04/20 1414  CREATININE 1.72*    Estimated Creatinine Clearance: 22.6 mL/min (A) (by C-G formula based on SCr of 1.72 mg/dL (H)).    Allergies  Allergen Reactions  . Advicor [Niacin-Lovastatin Er] Other (See Comments)    Reaction unknown to daughter  . Caduet [Amlodipine-Atorvastatin] Other (See Comments)    Reaction unknown to daughter  . Codeine Other (See Comments)    Reaction unknown to daughter  . Dilantin [Phenytoin] Other (See Comments)    Reaction unknown to daughter  . Lescol [Fluvastatin] Other (See Comments)    Reaction unknown to daughter  . Pamelor [Nortriptyline] Other (See Comments)    Reaction unknown to daughter  . Penicillins Hives    Did it involve swelling of the face/tongue/throat, SOB, or low BP? Unk Did it involve sudden or severe rash/hives, skin peeling, or any reaction on the inside of your mouth or nose? Unk Did you need to seek medical attention at a hospital or doctor's office? Unk When did it last happen? "it was a long time ago" If all above answers are "NO", may proceed with cephalosporin use.   . Sulfa Antibiotics Hives  . Trileptal [Oxcarbazepine] Other (See Comments)    Reaction unknown to daughter  . Ultram [Tramadol] Other (See Comments)    Reaction unknown to daughter  . Welchol [Colesevelam] Other (See Comments)    Reaction unknown to daughter  . Latex Rash  . Tomato Rash    Antimicrobials this admission: Vanco 9/20 >>  Cefepime 9/20 x 1   Microbiology results: 9/20 MRSA PCR:  pending.  Thank you for allowing pharmacy to be a part of this patient's care.  Margot Ables, PharmD Clinical Pharmacist 01/04/2020 3:07 PM

## 2020-01-04 NOTE — H&P (Addendum)
History and Physical    RAJANAE MANTIA HYI:[id number] DOB: [birth year] DOA: 01/04/2020  PCP: [name], [name]   Patient coming from: Home   Chief Complaint: Dyspnea   HPI: [name] is a 86 y.o. female with medical history significant of obesity, HTN, dyslipidemia, hypertension, Hx of CVA, ambulatory dysfunction and seizures.  Apparently patient lives by herself, today she was unable to get out of her bed, her neighbor found her in distress and called EMS. Patient reports not feeling well for several years, she gets help from her neighbors for her meals, and uses a walker for ambulation. She mentions frequent falls and seizure events.  09/13 she was diagnosed with thrush and oral pain, not able to eat well and having difficulty taking her oral medications. Last night did no take her antiepileptic medications for this reason.   Patient denies any fevers, cough or frank dyspnea.   ED Course: Patient was found tachycardic, tachypneic, with increased work of breathing. Her chest radiograph showed a right upper lobe cavitation, and her blood work shows severe leukocytosis. Patient received cefepime, vancomycin, 1 L of normal saline and 5 mg of IV metoprolol. She was referred for admission for evaluation.  Review of Systems:  1. General: No fevers, no chills, no weight gain or weight loss/ generalized weakness for several months.   2. ENT: No runny nose or sore throat, no hearing disturbances 3. Pulmonary: No dyspnea, cough, wheezing, or hemoptysis 4. Cardiovascular: No angina, claudication, lower extremity edema, pnd or orthopnea 5. Gastrointestinal: No nausea or vomiting, no diarrhea or constipation/ poor appetite.  6. Hematology: No easy bruisability or frequent infections 7. Urology: No dysuria, hematuria or increased urinary frequency 8. Dermatology: No rashes. 9. Neurology: positive for seizures or paresthesias 10. Musculoskeletal: No joint pain or deformities  Past Medical  History:  Diagnosis Date  . Chronic low back pain   . Degenerative arthritis   . Eczema   . Edema   . Gout   . Headache   . Hiatal hernia   . Hypertension   . Internal hemorrhoids   . Neurogenic claudication   . Obesity   . Seizure disorder ([place])   . Seizures ([place])   . Stroke ([place])   . Urticaria     Past Surgical History:  Procedure Laterality Date  . ABDOMINAL HYSTERECTOMY    . APPENDECTOMY    . APPENDECTOMY    . bilateral knees    . cataracts    . CHOLECYSTECTOMY    . mammoplasty reduction    . NEUROPLASTY / TRANSPOSITION MEDIAN NERVE AT CARPAL TUNNEL BILATERAL    . RE-EXCISION OF BREAST LUMPECTOMY    . TUBAL LIGATION       reports that she has never smoked. She has never used smokeless tobacco. She reports that she does not drink alcohol and does not use drugs.  Allergies  Allergen Reactions  . Advicor [Niacin-Lovastatin Er] Other (See Comments)    Reaction unknown to daughter  . Caduet [Amlodipine-Atorvastatin] Other (See Comments)    Reaction unknown to daughter  . Codeine Other (See Comments)    Reaction unknown to daughter  . Dilantin [Phenytoin] Other (See Comments)    Reaction unknown to daughter  . Lescol [Fluvastatin] Other (See Comments)    Reaction unknown to daughter  . Pamelor [Nortriptyline] Other (See Comments)    Reaction unknown to daughter  . Penicillins Hives    Did it involve swelling of the face/tongue/throat, SOB, or low BP?  Unk Did it involve sudden or severe rash/hives, skin peeling, or any reaction on the inside of your mouth or nose? Unk Did you need to seek medical attention at a hospital or doctor's office? Unk When did it last happen? "it was a long time ago" If all above answers are "NO", may proceed with cephalosporin use.   . Sulfa Antibiotics Hives  . Trileptal [Oxcarbazepine] Other (See Comments)    Reaction unknown to daughter  . Ultram [Tramadol] Other (See Comments)    Reaction unknown to daughter  . Welchol  [Colesevelam] Other (See Comments)    Reaction unknown to daughter  . Latex Rash  . Tomato Rash    Family History  Problem Relation Age of Onset  . Diabetes Mother   . Heart disease Mother   . Heart disease Father   . Cancer Sister   . Cancer Brother   . Cancer Sister      Prior to Admission medications   Medication Sig Start Date End Date Taking? Authorizing Provider  acetaminophen (TYLENOL) 500 MG tablet Take 500-1,000 mg by mouth every 8 (eight) hours as needed for mild pain or headache.    Yes [provider]  allopurinol (ZYLOPRIM) 100 MG tablet TAKE 1/2 TABLET BY MOUTH EVERY DAY Patient taking differently: Take 50 mg by mouth daily.  09/24/19  Yes Hawks, Christy A, [name]  aspirin 81 MG tablet Take 81 mg by mouth daily.   Yes [provider]  cetirizine (ZYRTEC) 10 MG tablet TAKE 1 TABLET BY MOUTH EVERY DAY Patient taking differently: Take 10 mg by mouth daily.  09/25/19  Yes Hawks, Christy A, [name]  divalproex (DEPAKOTE) 250 MG DR tablet TAKE ONE TABLET BY MOUTH TWICE A DAY FOR 2 WEEKS, THEN TAKE 2 TABLETS TWICE A DAY THEREAFTER Patient taking differently: Take 500 mg by mouth 2 (two) times daily.  10/20/19  Yes Kathrynn Ducking, MD  famotidine (PEPCID) 40 MG tablet Take 1 tablet (40 mg total) by mouth daily. Patient taking differently: Take 40 mg by mouth daily as needed for heartburn.  04/08/19  Yes Kozlow, Donnamarie Poag, MD  Fluticasone Furoate (ARNUITY ELLIPTA) 200 MCG/ACT AEPB INHALE 1 PUFF BY MOUTH EVERY DAY Patient taking differently: Take 1 puff by mouth daily.  12/03/19  Yes Kozlow, Donnamarie Poag, MD  ketoconazole (NIZORAL) 2 % cream Apply to the affected areas underneath breasts, belly and in groin region ONCE daily x2-4 weeks. Patient taking differently: Apply 1 application topically See admin instructions. Apply to the affected areas underneath breasts, belly and in groin region ONCE daily for 2-4 weeks 09/16/18  Yes Gottschalk, Ashly M, DO  levETIRAcetam (KEPPRA XR) 500  MG 24 hr tablet Take 4 tablets (2,000 mg total) by mouth at bedtime. 11/10/18  Yes Suzzanne Cloud, NP  metolazone (ZAROXOLYN) 2.5 MG tablet Take 1 Tablet by mouth on Mondays, Wednesdays and Fridays 08/17/19  Yes Hawks, [place] A, [name]  metoprolol tartrate (LOPRESSOR) 25 MG tablet Take 0.5 tablets (12.5 mg total) by mouth 2 (two) times daily. (Needs to be seen before next refill) Patient taking differently: Take 12.5 mg by mouth 2 (two) times daily.  06/29/19  Yes Hawks, Christy A, [name]  mometasone (ELOCON) 0.1 % ointment Apply to skin once daily as directed after bathing. 03/04/18  Yes Kozlow, Donnamarie Poag, MD  montelukast (SINGULAIR) 10 MG tablet TAKE 1 TABLET BY MOUTH EVERYDAY AT BEDTIME Patient taking differently: Take 10 mg by mouth at bedtime.  12/03/19  Yes Kozlow,  Donnamarie Poag, MD  Multiple Vitamins-Minerals (ONE-A-DAY WOMENS 50 PLUS) TABS Take 1 tablet by mouth daily with breakfast.   Yes [provider]  nystatin (MYCOSTATIN) 100000 UNIT/ML suspension Take 5 mLs (500,000 Units total) by mouth 4 (four) times daily. 12/28/19  Yes Dettinger, Fransisca Kaufmann, MD  nystatin-triamcinolone ointment [facility]) Apply 1 application topically 2 (two) times daily. 11/16/15  Yes Hawks, Christy A, [name]  omeprazole (PRILOSEC) 40 MG capsule Take 1 capsule (40 mg total) by mouth daily. Patient taking differently: Take 40 mg by mouth daily as needed (for reflux).  07/03/18  Yes Kozlow, Donnamarie Poag, MD  triamcinolone cream (KENALOG) 0.1 % Apply 1 application topically 2 (two) times daily. Patient taking differently: Apply 1 application topically 2 (two) times daily as needed (for eczema).  03/18/18  Yes Kozlow, Donnamarie Poag, MD  trimethoprim-polymyxin b (POLYTRIM) ophthalmic solution Place 1 drop into the right eye every 6 (six) hours. 12/18/18   [name], [name]    Physical Exam: Vitals:   01/04/20 1330 01/04/20 1415 01/04/20 1418 01/04/20 1443  BP: 105/60 104/82    Pulse: (!) 153  (!) 145   Resp: 20 (!) 24 (!) 22   Temp:   98 F  (36.7 C) 98.3 F (36.8 C)  TempSrc:   Rectal Rectal  SpO2: 93%  97%     Vitals:   01/04/20 1330 01/04/20 1415 01/04/20 1418 01/04/20 1443  BP: 105/60 104/82    Pulse: (!) 153  (!) 145   Resp: 20 (!) 24 (!) 22   Temp:   98 F (36.7 C) 98.3 F (36.8 C)  TempSrc:   Rectal Rectal  SpO2: 93%  97%    General: deconditioned and ill looking appearing.  Neurology: Awake and alert, non focal Head and Neck. Head normocephalic. Neck supple with no adenopathy or thyromegaly.   E ENT: mild pallor, no icterus, oral mucosa dry. Left eye with purulent discharge,  Cardiovascular: No JVD. S1-S2 present, rhythmic, tachycardic, with no gallops, rubs, or murmurs. No lower extremity edema. Pulmonary: positive breath sounds bilaterally, decreased air movement, no wheezing, rhonchi or rales. Gastrointestinal. Abdomen protuberant, non tender Skin. No rashes Musculoskeletal: no joint deformities    Labs on Admission: I have personally reviewed following labs and imaging studies  CBC: Recent Labs  Lab 01/04/20 1414  WBC 20.3*  HGB 10.0*  HCT 31.6*  MCV 73.8*  PLT 161*   Basic Metabolic Panel: Recent Labs  Lab 01/04/20 1414  NA 138  K 3.8  CL 99  CO2 25  GLUCOSE 107*  BUN 33*  CREATININE 1.72*  CALCIUM 10.1   GFR: Estimated Creatinine Clearance: 22.6 mL/min (A) (by C-G formula based on SCr of 1.72 mg/dL (H)). Liver Function Tests: No results for input(s): AST, ALT, ALKPHOS, BILITOT, PROT, ALBUMIN in the last 168 hours. No results for input(s): LIPASE, AMYLASE in the last 168 hours. No results for input(s): AMMONIA in the last 168 hours. Coagulation Profile: No results for input(s): INR, PROTIME in the last 168 hours. Cardiac Enzymes: No results for input(s): CKTOTAL, CKMB, CKMBINDEX, TROPONINI in the last 168 hours. BNP (last 3 results) No results for input(s): PROBNP in the last 8760 hours. HbA1C: No results for input(s): HGBA1C in the last 72 hours. CBG: No results for  input(s): GLUCAP in the last 168 hours. Lipid Profile: No results for input(s): CHOL, HDL, LDLCALC, TRIG, CHOLHDL, LDLDIRECT in the last 72 hours. Thyroid Function Tests: Recent Labs    01/04/20 1414  TSH  1.265   Anemia Panel: No results for input(s): VITAMINB12, FOLATE, FERRITIN, TIBC, IRON, RETICCTPCT in the last 72 hours. Urine analysis:    Component Value Date/Time   COLORURINE STRAW (A) 03/12/2019 1722   APPEARANCEUR CLEAR 03/12/2019 1722   LABSPEC 1.005 03/12/2019 1722   PHURINE 9.0 (H) 03/12/2019 1722   GLUCOSEU NEGATIVE 03/12/2019 1722   HGBUR NEGATIVE 03/12/2019 1722   BILIRUBINUR NEGATIVE 03/12/2019 1722   BILIRUBINUR neg 03/09/2015 1652   KETONESUR 5 (A) 03/12/2019 1722   PROTEINUR NEGATIVE 03/12/2019 1722   UROBILINOGEN negative 03/09/2015 1652   NITRITE NEGATIVE 03/12/2019 1722   LEUKOCYTESUR NEGATIVE 03/12/2019 1722    Radiological Exams on Admission: DG Chest Port 1 View  Result Date: 01/04/2020 CLINICAL DATA:  Weakness and tachycardia. EXAM: PORTABLE CHEST 1 VIEW COMPARISON:  CTA chest and chest x-ray dated March 12, 2019. FINDINGS: The heart size and mediastinal contours are within normal limits. Normal pulmonary vascularity. New 5.3 cm lesion in the right upper lobe with central lucency, suggestive of cavitation. No pleural effusion or pneumothorax. No acute osseous abnormality. IMPRESSION: 1. Suspected new 5.3 cm cavitary lesion in the right upper lobe. CT of the chest with contrast is recommended for further evaluation. Electronically Signed   By: Titus Dubin M.D.   On: 01/04/2020 13:45    EKG: Independently reviewed. 148 bpm, left axis deviation with left anterior fascicular block, SVT rhythm, with no ST segment or T wave changes.  Assessment/Plan Principal Problem:   Pneumonia Active Problems:   Hiatal hernia   Seizure disorder (HCC)   Essential hypertension, benign   History of stroke   Asthma   Class 2 obesity   CKD (chronic kidney  disease) stage 3, GFR 30-59 ml/min    Elderly 86 year old female who lives alone, history of CVA, seizures and ambulatory dysfunction. She reports having frequent falls and uncontrolled seizures at home. Her history is limited, due to cognitive dysfunction. She was found in distress at her home by her neighbor. On her initial physical examination her temperature 98, blood pressure 105/60, heart rate 153, respiratory rate 20, oxygen saturation 93% on room air, her lungs anterior auscultation with no wheezing or rhonchi, heart S1-S2, present, tachycardic, abdomen protuberant, no lower extremity edema. Patient is awake and alert, nonfocal,but  disoriented to situation. Sodium 138, potassium 3.8, chloride 99, bicarb 25, glucose 107, BUN 33, creatinine 1.72, white count 20.3, hemoglobin 10.0, hematocrit 31.6, platelets 924. SARS COVID-19 negative. Chest radiograph suggestive of right upper lobe cavitary lesion, atelectasis at right base. CT chest with dense right upper lobe infiltrate, no cavitation.  Patient will be admitted to the medical ward with a working diagnosis of right upper lobe pneumonia, likely aspiration, complicated by acute kidney injury, dehydration and SVT.  1. Severe sepsis due to right upper lobe pneumonia, endorgan damage acute kidney injury. (Present on admission) (source of infection pulmonary, positive systemic inflammatory response syndrome with leukocytosis, tachypnea and tachycardia).  Considering the location of the infiltrate at the right upper lobe, and history of uncontrolled seizures, I am concerned about aspiration pneumonia. Continue antibiotic therapy with ceftriaxone and metronidazole (patient allergic to penicillin), follow-up on sputum/blood cultures, cell count and temperature curve.  Consult speech therapy for formal swallow evaluation. Continue hydration with isotonic saline at 75 mL/h.  2. SVT. Continue telemetry monitoring, increase metoprolol to 50 mg twice  daily. Currently her blood pressure is stable.  3. Acute kidney injury on chronic kidney disease stage IIIb. Likely related to sepsis, continue hydration with  isotonic saline, 75 ml per hour, hold on metolazone.  4. History of CVA and seizures. Continue Keppra 2000 mg at bedtime, and Depakote 500 mg twice daily. Neurochecks per unit protocol. Continue aspirin and check lipid profile. Add dextrose to IV fluids. Neuro checks per unit protocol.   Consult physical and occupational therapy. .   5. Right eye conjunctivitis/ oral thrush. Continue topical antibiotic to right eye and oral nystatin.  6. Asthma. No signs of acute exacerbation. Continue montelukast and fluticasone.  7. Obesity class 2.  Calculated BMI is 36.7, will need outpatient follow up.  8. GERD and hiatal hernia. Continue proton pump inhibitor with pantoprazole.   Status is: Inpatient  Remains inpatient appropriate because:IV treatments appropriate due to intensity of illness or inability to take PO   Dispo: The patient is from: Home              Anticipated d/c is to: Home              Anticipated d/c date is: 3 days              Patient currently is not medically stable to d/c.    DVT prophylaxis: Enoxaparin   Code Status:   full  Family Communication:   I spoke over the phone with the patient's daughter about patient's  condition, plan of care, prognosis and all questions were addressed.   Consults called:  None   Admission status:  Inpatient    Amyla Heffner Gerome Apley MD [facility]   01/04/2020, 3:59 PM

## 2020-01-04 NOTE — ED Provider Notes (Signed)
[facility] EMERGENCY DEPARTMENT Provider Note   CSN: [id number] Arrival date & time: 01/04/20  1256     History Chief Complaint  Patient presents with  . Weakness    [name] is a 86 y.o. female.  The history is provided by the patient, the EMS personnel and medical records. No language interpreter was used.     86 year old female significant history of seizures, prior stroke, recurrent headache, chronic back pain, brought here via EMS from home for evaluation of generalized weakness. History is limited as patient is a poor historian. Additional history obtained through EMS. Per EMS, patient's caregiver called out because patient was having weakness shortness of breath. When EMS arrived, it appears that patient was arguing with the care provider. Patient states she does not have any symptoms but if she is going to the ER then she does not mind having a "checkup". She does endorse occasional cough which is not new. She endorsed mild discomfort to her left arm from her elbow down to her hands that have been sporadic. Mild at this time. She does not complain of any headache, chest pain, shortness of breath, back pain, abdominal pain, dysuria. No recent medication changes. She has been eating and drinking fine. She has not had a Covid vaccination. She does not complain of lightheadedness or dizziness.  Past Medical History:  Diagnosis Date  . Chronic low back pain   . Degenerative arthritis   . Eczema   . Edema   . Gout   . Headache   . Hiatal hernia   . Hypertension   . Internal hemorrhoids   . Neurogenic claudication   . Obesity   . Seizure disorder ([place])   . Seizures ([place])   . Stroke ([place])   . Urticaria     Patient Active Problem List   Diagnosis Date Noted  . Nausea vomiting and diarrhea 03/12/2019  . Lung nodules 03/12/2019  . Atypical chest pain 12/31/2018  . Asthma 02/12/2017  . Chest pain 11/13/2016  . History of seizures 11/13/2016  . History of stroke  11/13/2016  . Allergic urticaria 08/30/2016  . GAD (generalized anxiety disorder) 08/30/2016  . Gout 08/30/2016  . Acute renal failure superimposed on stage 3 chronic kidney disease ([place]) 10/11/2015  . Neurogenic claudication   . Morbid obesity ([place])   . Edema   . Essential hypertension, benign 09/30/2014  . Secondary hyperparathyroidism, renal ([place]) 09/30/2014  . Proteinuria 09/30/2014  . Anemia associated with chronic renal failure 09/30/2014  . Chronic kidney disease (CKD), stage IV (severe) ([employer] [facility]) 09/30/2014  . Hiatal hernia 08/21/2012  . Seizure disorder ([place]) 08/21/2012    Past Surgical History:  Procedure Laterality Date  . ABDOMINAL HYSTERECTOMY    . APPENDECTOMY    . APPENDECTOMY    . bilateral knees    . cataracts    . CHOLECYSTECTOMY    . mammoplasty reduction    . NEUROPLASTY / TRANSPOSITION MEDIAN NERVE AT CARPAL TUNNEL BILATERAL    . RE-EXCISION OF BREAST LUMPECTOMY    . TUBAL LIGATION       OB History    Gravida  6   Para  4   Term  4   Preterm      AB  2   Living        SAB  2   TAB      Ectopic      Multiple      Live Births  Family History  Problem Relation Age of Onset  . Diabetes Mother   . Heart disease Mother   . Heart disease Father   . Cancer Sister   . Cancer Brother   . Cancer Sister     Social History   Tobacco Use  . Smoking status: Never Smoker  . Smokeless tobacco: Never Used  Vaping Use  . Vaping Use: Never used  Substance Use Topics  . Alcohol use: No  . Drug use: No    Home Medications Prior to Admission medications   Medication Sig Start Date End Date Taking? Authorizing Provider  acetaminophen (TYLENOL) 500 MG tablet Take 500-1,000 mg by mouth every 8 (eight) hours as needed for mild pain or headache.     [provider]  allopurinol (ZYLOPRIM) 100 MG tablet TAKE 1/2 TABLET BY MOUTH EVERY DAY 09/24/19   [name] A, [name]  aspirin 81 MG tablet Take 81 mg by mouth daily.     [provider]  cetirizine (ZYRTEC) 10 MG tablet TAKE 1 TABLET BY MOUTH EVERY DAY 09/25/19   [name] A, [name]  divalproex (DEPAKOTE) 250 MG DR tablet TAKE ONE TABLET BY MOUTH TWICE A DAY FOR 2 WEEKS, THEN TAKE 2 TABLETS TWICE A DAY THEREAFTER 10/20/19   Kathrynn Ducking, MD  famotidine (PEPCID) 40 MG tablet Take 1 tablet (40 mg total) by mouth daily. 04/08/19   Kozlow, Donnamarie Poag, MD  Fluticasone Furoate (ARNUITY ELLIPTA) 200 MCG/ACT AEPB INHALE 1 PUFF BY MOUTH EVERY DAY 12/03/19   Kozlow, Donnamarie Poag, MD  ketoconazole (NIZORAL) 2 % cream Apply to the affected areas underneath breasts, belly and in groin region ONCE daily x2-4 weeks. Patient taking differently: Apply 1 application topically See admin instructions. Apply to the affected areas underneath breasts, belly and in groin region ONCE daily for 2-4 weeks 09/16/18   Ronnie Doss M, DO  levETIRAcetam (KEPPRA XR) 500 MG 24 hr tablet Take 4 tablets (2,000 mg total) by mouth at bedtime. 11/10/18   Suzzanne Cloud, NP  metolazone (ZAROXOLYN) 2.5 MG tablet Take 1 Tablet by mouth on Mondays, Wednesdays and Fridays 08/17/19   [name] A, [name]  metoprolol tartrate (LOPRESSOR) 25 MG tablet Take 0.5 tablets (12.5 mg total) by mouth 2 (two) times daily. (Needs to be seen before next refill) 06/29/19   [name], [name]  mometasone (ELOCON) 0.1 % ointment Apply to skin once daily as directed after bathing. 03/04/18   Kozlow, Donnamarie Poag, MD  montelukast (SINGULAIR) 10 MG tablet TAKE 1 TABLET BY MOUTH EVERYDAY AT BEDTIME 12/03/19   Kozlow, Donnamarie Poag, MD  Multiple Vitamins-Minerals (ONE-A-DAY WOMENS 50 PLUS) TABS Take 1 tablet by mouth daily with breakfast.    [provider]  nystatin (MYCOSTATIN) 100000 UNIT/ML suspension Take 5 mLs (500,000 Units total) by mouth 4 (four) times daily. 12/28/19   Dettinger, Fransisca Kaufmann, MD  nystatin-triamcinolone ointment [facility]) Apply 1 application topically 2 (two) times daily. 11/16/15   [name], [name]   omeprazole (PRILOSEC) 40 MG capsule Take 1 capsule (40 mg total) by mouth daily. Patient taking differently: Take 40 mg by mouth daily as needed (for reflux).  07/03/18   Kozlow, Donnamarie Poag, MD  Propylene Glycol (SYSTANE BALANCE OP) Place 1 drop into both eyes 2 (two) times daily.     [provider]  triamcinolone cream (KENALOG) 0.1 % Apply 1 application topically 2 (two) times daily. Patient taking differently: Apply 1 application topically 2 (two) times daily  as needed (for eczema).  03/18/18   Kozlow, Donnamarie Poag, MD  trimethoprim-polymyxin b (POLYTRIM) ophthalmic solution Place 1 drop into the right eye every 6 (six) hours. 12/18/18   [name], [name]    Allergies    Advicor [niacin-lovastatin er], Caduet [amlodipine-atorvastatin], Codeine, Dilantin [phenytoin], Lescol [fluvastatin], Pamelor [nortriptyline], Penicillins, Sulfa antibiotics, Trileptal [oxcarbazepine], Ultram [tramadol], Welchol [colesevelam], Latex, and Tomato  Review of Systems   Review of Systems  All other systems reviewed and are negative.   Physical Exam Updated Vital Signs BP 104/82   Pulse (!) 145   Temp 98.3 F (36.8 C) (Rectal)   Resp (!) 22   SpO2 97%   Physical Exam Vitals and nursing note reviewed.  Constitutional:      General: She is not in acute distress.    Appearance: She is well-developed. She is obese.     Comments: Elderly obese female resting in bed appears to be in no acute discomfort.  HENT:     Head: Atraumatic.  Eyes:     Conjunctiva/sclera: Conjunctivae normal.  Cardiovascular:     Rate and Rhythm: Tachycardia present.     Pulses: Normal pulses.     Heart sounds: Normal heart sounds. No murmur heard.   Pulmonary:     Effort: Pulmonary effort is normal.     Breath sounds: Normal breath sounds. No wheezing, rhonchi or rales.  Abdominal:     Palpations: Abdomen is soft.     Tenderness: There is no abdominal tenderness.  Musculoskeletal:     Cervical back: Neck supple.      Comments: 5 out of 5 strength to all 4 extremities with intact distal pulses. Palpation of left arm without focal point tenderness, radial pulse 2+.  Skin:    Findings: No rash.  Neurological:     Mental Status: She is alert.     GCS: GCS eye subscore is 4. GCS verbal subscore is 5. GCS motor subscore is 6.     Cranial Nerves: Cranial nerves are intact.     Sensory: Sensation is intact.     Comments: Patient is alert to self place and time.     ED Results / Procedures / Treatments   Labs (all labs ordered are listed, but only abnormal results are displayed) Labs Reviewed  BASIC METABOLIC PANEL - Abnormal; Notable for the following components:      Result Value   Glucose, Bld 107 (*)    BUN 33 (*)    Creatinine, Ser 1.72 (*)    GFR calc non Af Amer 25 (*)    GFR calc Af Amer 29 (*)    All other components within normal limits  CBC - Abnormal; Notable for the following components:   WBC 20.3 (*)    Hemoglobin 10.0 (*)    HCT 31.6 (*)    MCV 73.8 (*)    MCH 23.4 (*)    RDW 19.4 (*)    Platelets 924 (*)    nRBC 0.3 (*)    All other components within normal limits  SARS CORONAVIRUS 2 BY RT PCR (HOSPITAL ORDER, [place] [facility])  MRSA PCR SCREENING  TSH  URINALYSIS, ROUTINE W REFLEX MICROSCOPIC  LACTIC ACID, PLASMA    EKG EKG Interpretation  Date/Time:  Monday January 04 2020 13:31:06 EDT Ventricular Rate:  148 PR Interval:    QRS Duration: 89 QT Interval:  300 QTC Calculation: 471 R Axis:   -46 Text Interpretation: Supraventricular tachycardia Left  anterior fascicular block Confirmed by Pattricia Boss [id number]) on 01/04/2020 1:34:39 PM   Radiology DG Chest Port 1 View  Result Date: 01/04/2020 CLINICAL DATA:  Weakness and tachycardia. EXAM: PORTABLE CHEST 1 VIEW COMPARISON:  CTA chest and chest x-ray dated March 12, 2019. FINDINGS: The heart size and mediastinal contours are within normal limits. Normal pulmonary vascularity. New 5.3 cm lesion  in the right upper lobe with central lucency, suggestive of cavitation. No pleural effusion or pneumothorax. No acute osseous abnormality. IMPRESSION: 1. Suspected new 5.3 cm cavitary lesion in the right upper lobe. CT of the chest with contrast is recommended for further evaluation. Electronically Signed   By: Titus Dubin M.D.   On: 01/04/2020 13:45    Procedures Procedures (including critical care time)  Medications Ordered in ED Medications  metoprolol tartrate (LOPRESSOR) injection 5 mg (5 mg Intravenous Not Given 01/04/20 1443)  vancomycin (VANCOREADY) IVPB 2000 mg/400 mL (has no administration in time range)  vancomycin (VANCOREADY) IVPB 1500 mg/300 mL (has no administration in time range)  sodium chloride 0.9 % bolus 1,000 mL (1,000 mLs Intravenous New Bag/Given (Non-Interop) 01/04/20 1410)  ceFEPIme (MAXIPIME) 2 g in sodium chloride 0.9 % 100 mL IVPB (2 g Intravenous New Bag/Given 01/04/20 1452)    ED Course  I have reviewed the triage vital signs and the nursing notes.  Pertinent labs & imaging results that were available during my care of the patient were reviewed by me and considered in my medical decision making (see chart for details).    MDM Rules/Calculators/A&P                          BP 104/82   Pulse (!) 145   Temp 98.3 F (36.8 C) (Rectal)   Resp (!) 22   SpO2 97%   Final Clinical Impression(s) / ED Diagnoses Final diagnoses:  Community acquired pneumonia of right upper lobe of lung  AKI (acute kidney injury) ([place])    Rx / DC Orders ED Discharge Orders    None     1:16 PM Patient brought here due to caregiver concern for shortness of breath and generalized weakness. Patient currently denies having any active symptoms and denies any pain except for occasional pain in her left arm from her elbow down to her hands. Examination of left is unremarkable. She denies any active chest pain. Patient however is tachycardic with heart rate in the 150s. Will obtain  EKG and give IV fluid. She has not been vaccinated for Covid and does endorse occasional cough. Will obtain COVID-19 test. Work-up initiated.  2:21 PM Chest x-ray obtained today demonstrated a suspected new 5.3 cm cavitary lesion in the right upper lobe.  CT of the chest with contrast is recommended for further evaluation.  This finding is new compared to her previous chest CT imaging on March 12, 2019.  At this time, will initiate broad-spectrum antibiotic including cefepime and vancomycin, and will obtain appropriate imaging.  Care discussed with Dr. Jeanell Sparrow.   3:57 PM Worsening renal function with BUN 33, creatinine 1.72 likely reflecting a state of dehydration versus worsening chronic renal function.  Chest CT with contrast would have to be changed to CT without contrast due to her renal function.  She has an elevated white count of 20.3.  A rectal temp is normal.  Her platelets is elevated at 924.  TSH is normal.  COVID-19 test is currently pending.  Appreciate consultation from [facility] hospitalist, Dr.  Arrien who agrees to see and admit the patient for further management of her lung infection.  KIMIAH HIBNER was evaluated in Emergency Department on 01/04/2020 for the symptoms described in the history of present illness. She was evaluated in the context of the global COVID-19 pandemic, which necessitated consideration that the patient might be at risk for infection with the SARS-CoV-2 virus that causes COVID-19. Institutional protocols and algorithms that pertain to the evaluation of patients at risk for COVID-19 are in a state of rapid change based on information released by regulatory bodies including the CDC and federal and state organizations. These policies and algorithms were followed during the patient's care in the ED.    Domenic Moras, PA-C 01/04/20 1559    Pattricia Boss, MD 01/05/20 [phone]

## 2020-01-04 NOTE — ED Triage Notes (Signed)
Pt brought in by [facility] for c/o weakness; ems reports heart rate was 150's and pt was cool and clammy; pt c/o left arm pain

## 2020-01-05 DIAGNOSIS — G40909 Epilepsy, unspecified, not intractable, without status epilepticus: Secondary | ICD-10-CM

## 2020-01-05 DIAGNOSIS — N179 Acute kidney failure, unspecified: Secondary | ICD-10-CM

## 2020-01-05 LAB — CBC WITH DIFFERENTIAL/PLATELET
Abs Immature Granulocytes: 0.15 10*3/uL — ABNORMAL HIGH (ref 0.00–0.07)
Basophils Absolute: 0.5 10*3/uL — ABNORMAL HIGH (ref 0.0–0.1)
Basophils Relative: 3 %
Eosinophils Absolute: 0.1 10*3/uL (ref 0.0–0.5)
Eosinophils Relative: 1 %
HCT: 28.2 % — ABNORMAL LOW (ref 36.0–46.0)
Hemoglobin: 8.9 g/dL — ABNORMAL LOW (ref 12.0–15.0)
Immature Granulocytes: 1 %
Lymphocytes Relative: 14 %
Lymphs Abs: 2.7 10*3/uL (ref 0.7–4.0)
MCH: 23.7 pg — ABNORMAL LOW (ref 26.0–34.0)
MCHC: 31.6 g/dL (ref 30.0–36.0)
MCV: 75.2 fL — ABNORMAL LOW (ref 80.0–100.0)
Monocytes Absolute: 0.1 10*3/uL (ref 0.1–1.0)
Monocytes Relative: 0 %
Neutro Abs: 15.3 10*3/uL — ABNORMAL HIGH (ref 1.7–7.7)
Neutrophils Relative %: 81 %
Platelets: 826 10*3/uL — ABNORMAL HIGH (ref 150–400)
RBC: 3.75 MIL/uL — ABNORMAL LOW (ref 3.87–5.11)
RDW: 18.2 % — ABNORMAL HIGH (ref 11.5–15.5)
WBC: 18.8 10*3/uL — ABNORMAL HIGH (ref 4.0–10.5)
nRBC: 0.2 % (ref 0.0–0.2)

## 2020-01-05 LAB — CBC
HCT: 28.2 % — ABNORMAL LOW (ref 36.0–46.0)
Hemoglobin: 8.8 g/dL — ABNORMAL LOW (ref 12.0–15.0)
MCH: 23.8 pg — ABNORMAL LOW (ref 26.0–34.0)
MCHC: 31.2 g/dL (ref 30.0–36.0)
MCV: 76.2 fL — ABNORMAL LOW (ref 80.0–100.0)
Platelets: 770 10*3/uL — ABNORMAL HIGH (ref 150–400)
RBC: 3.7 MIL/uL — ABNORMAL LOW (ref 3.87–5.11)
RDW: 18.7 % — ABNORMAL HIGH (ref 11.5–15.5)
WBC: 16.8 10*3/uL — ABNORMAL HIGH (ref 4.0–10.5)
nRBC: 0.3 % — ABNORMAL HIGH (ref 0.0–0.2)

## 2020-01-05 LAB — BASIC METABOLIC PANEL
Anion gap: 9 (ref 5–15)
BUN: 30 mg/dL — ABNORMAL HIGH (ref 8–23)
CO2: 25 mmol/L (ref 22–32)
Calcium: 9.1 mg/dL (ref 8.9–10.3)
Chloride: 104 mmol/L (ref 98–111)
Creatinine, Ser: 1.54 mg/dL — ABNORMAL HIGH (ref 0.44–1.00)
GFR calc Af Amer: 33 mL/min — ABNORMAL LOW (ref >60)
GFR calc non Af Amer: 29 mL/min — ABNORMAL LOW (ref >60)
Glucose, Bld: 100 mg/dL — ABNORMAL HIGH (ref 70–99)
Potassium: 3.8 mmol/L (ref 3.5–5.1)
Sodium: 138 mmol/L (ref 135–145)

## 2020-01-05 LAB — COMPREHENSIVE METABOLIC PANEL
ALT: 18 U/L (ref 0–44)
AST: 23 U/L (ref 15–41)
Albumin: 2.6 g/dL — ABNORMAL LOW (ref 3.5–5.0)
Alkaline Phosphatase: 90 U/L (ref 38–126)
Anion gap: 10 (ref 5–15)
BUN: 31 mg/dL — ABNORMAL HIGH (ref 8–23)
CO2: 25 mmol/L (ref 22–32)
Calcium: 9.2 mg/dL (ref 8.9–10.3)
Chloride: 102 mmol/L (ref 98–111)
Creatinine, Ser: 1.55 mg/dL — ABNORMAL HIGH (ref 0.44–1.00)
GFR calc Af Amer: 33 mL/min — ABNORMAL LOW (ref >60)
GFR calc non Af Amer: 28 mL/min — ABNORMAL LOW (ref >60)
Glucose, Bld: 121 mg/dL — ABNORMAL HIGH (ref 70–99)
Potassium: 3.9 mmol/L (ref 3.5–5.1)
Sodium: 137 mmol/L (ref 135–145)
Total Bilirubin: 0.3 mg/dL (ref 0.3–1.2)
Total Protein: 7.2 g/dL (ref 6.5–8.1)

## 2020-01-05 LAB — LIPID PANEL
Cholesterol: 90 mg/dL (ref 0–200)
HDL: 26 mg/dL — ABNORMAL LOW (ref >40)
LDL Cholesterol: 49 mg/dL (ref 0–99)
Total CHOL/HDL Ratio: 3.5 RATIO
Triglycerides: 73 mg/dL (ref <150)
VLDL: 15 mg/dL (ref 0–40)

## 2020-01-05 LAB — PROTIME-INR
INR: 1.3 — ABNORMAL HIGH (ref 0.8–1.2)
Prothrombin Time: 15.6 seconds — ABNORMAL HIGH (ref 11.4–15.2)

## 2020-01-05 LAB — APTT: aPTT: 49 seconds — ABNORMAL HIGH (ref 24–36)

## 2020-01-05 LAB — LACTIC ACID, PLASMA: Lactic Acid, Venous: 1.3 mmol/L (ref 0.5–1.9)

## 2020-01-05 MED ORDER — SODIUM CHLORIDE 0.9 % IV SOLN: INTRAVENOUS | Status: AC

## 2020-01-05 NOTE — Evaluation (Signed)
Occupational Therapy Evaluation Patient Details Name: [name] MRN: [id number] DOB: [birth year] Today's Date: 01/05/2020    History of Present Illness [name] is a 86 y.o. female with medical history significant of obesity, HTN, dyslipidemia, hypertension, Hx of CVA, ambulatory dysfunction and seizures.    Clinical Impression   Pt agreeable to OT evaluation this am. Pt reports feeling like "normal" this am. Pt performing seated ADLs with set-up, requiring assist to donn socks. Pt performing functional mobility using RW with supervision/min guard. Recommend HHOT services to evaluate pt environment and safety during ADLs. No further acute OT services required at this time.     Follow Up Recommendations  Home health OT    Equipment Recommendations  None recommended by OT       Precautions / Restrictions Precautions Precautions: Fall Restrictions Weight Bearing Restrictions: No      Mobility Bed Mobility Overal bed mobility: Modified Independent             General bed mobility comments: increased time for coming to EOB  Transfers Overall transfer level: Needs assistance Equipment used: Rolling walker (2 wheeled) Transfers: Sit to/from [employer] Sit to Stand: Min guard Stand pivot transfers: Supervision;Min guard                ADL either performed or assessed with clinical judgement   ADL Overall ADL's : Needs assistance/impaired Eating/Feeding: Modified independent;Sitting   Grooming: Set up;Sitting Grooming Details (indicate cue type and reason): pt provided with items while seated in chair, no difficulty             Lower Body Dressing: Maximal assistance;Sitting/lateral leans Lower Body Dressing Details (indicate cue type and reason): Pt unable to bend at waist for donning socks Toilet Transfer: Supervision/safety;Stand-pivot;RW Armed forces technical officer Details (indicate cue type and reason): simulated with bed to chair  transfer         Functional mobility during ADLs: Supervision/safety;Min guard;Rolling walker       Vision Baseline Vision/History: Wears glasses Wears Glasses: Reading only Patient Visual Report: No change from baseline Vision Assessment?: No apparent visual deficits            Pertinent Vitals/Pain Pain Assessment: No/denies pain     Hand Dominance Right   Extremity/Trunk Assessment Upper Extremity Assessment Upper Extremity Assessment: Overall WFL for tasks assessed   Lower Extremity Assessment Lower Extremity Assessment: Defer to PT evaluation   Cervical / Trunk Assessment Cervical / Trunk Assessment: Normal   Communication Communication Communication: No difficulties   Cognition Arousal/Alertness: Awake/alert Behavior During Therapy: WFL for tasks assessed/performed Overall Cognitive Status: Within Functional Limits for tasks assessed                                                Home Living Family/patient expects to be discharged to:: Private residence Living Arrangements: Alone Available Help at Discharge: Family;Available PRN/intermittently Type of Home: House Home Access: Stairs to enter [employer] of Steps: 5-6 Entrance Stairs-Rails: Right Home Layout: One level     Bathroom Shower/Tub: Teacher, early years/pre: Handicapped height     Home Equipment: Bedside commode;Walker - 2 wheels;Walker - 4 wheels;Cane - single point;Shower seat          Prior Functioning/Environment Level of Independence: Needs assistance  Gait / Transfers Assistance Needed: Primarily uses SPC for mobility ADL's /  Homemaking Assistance Needed: Daughter assists with tub transfers and washing difficult to reach areas            OT Problem List: Decreased activity tolerance;Cardiopulmonary status limiting activity;Decreased safety awareness          End of Session Equipment Utilized During Treatment: Gait belt;Rolling  walker  Activity Tolerance: Patient tolerated treatment well Patient left: in chair;with call bell/phone within reach;with chair alarm set;Other (comment) (RT in room)  OT Visit Diagnosis: Muscle weakness (generalized) (M62.81)                Time: 4600-2984 OT Time Calculation (min): 31 min Charges:  OT General Charges $OT Visit: 1 Visit OT Evaluation $OT Eval Low Complexity: [place], OTR/L  [phone] 01/05/2020, 9:10 AM

## 2020-01-05 NOTE — Plan of Care (Signed)
  Problem: Acute Rehab PT Goals(only PT should resolve) Goal: Pt Will Go Supine/Side To Sit Outcome: Progressing Flowsheets (Taken 01/05/2020 1449) Pt will go Supine/Side to Sit: Independently Goal: Patient Will Transfer Sit To/From Stand Outcome: Progressing Flowsheets (Taken 01/05/2020 1449) Patient will transfer sit to/from stand:  with modified independence  Independently Goal: Pt Will Transfer Bed To Chair/Chair To Bed Outcome: Progressing Flowsheets (Taken 01/05/2020 1449) Pt will Transfer Bed to Chair/Chair to Bed:  with modified independence  Independently Goal: Pt Will Ambulate Outcome: Progressing Flowsheets (Taken 01/05/2020 1449) Pt will Ambulate:  100 feet  with modified independence  with rolling walker  with cane   2:50 PM, 01/05/20 Lonell Grandchild, MPT Physical Therapist with [facility] [phone] office [phone] mobile phone

## 2020-01-05 NOTE — Evaluation (Signed)
Clinical/Bedside Swallow Evaluation Patient Details  Name: [name] MRN: [id number] Date of Birth: Jun 07, 1923  Today's Date: 01/05/2020 Time: SLP Start Time (ACUTE ONLY): 1435 SLP Stop Time (ACUTE ONLY): 1458 SLP Time Calculation (min) (ACUTE ONLY): 23 min  Past Medical History:  Past Medical History:  Diagnosis Date  . Chronic low back pain   . Degenerative arthritis   . Eczema   . Edema   . Gout   . Headache   . Hiatal hernia   . Hypertension   . Internal hemorrhoids   . Neurogenic claudication   . Obesity   . Seizure disorder ([place])   . Seizures ([place])   . Stroke ([place])   . Urticaria    Past Surgical History:  Past Surgical History:  Procedure Laterality Date  . ABDOMINAL HYSTERECTOMY    . APPENDECTOMY    . APPENDECTOMY    . bilateral knees    . cataracts    . CHOLECYSTECTOMY    . mammoplasty reduction    . NEUROPLASTY / TRANSPOSITION MEDIAN NERVE AT CARPAL TUNNEL BILATERAL    . RE-EXCISION OF BREAST LUMPECTOMY    . TUBAL LIGATION     HPI:  [name] is a 86 y.o. female with medical history significant of obesity, HTN, dyslipidemia, hypertension, Hx of CVA, ambulatory dysfunction and seizures. Apparently patient lives by herself, today she was unable to get out of her bed, her neighbor found her in distress and called EMS. She was diagnosed with thrush and oral pain, not able to eat well and having difficulty taking her oral medications. Patient was found tachycardic, tachypneic, with increased work of breathing. Her chest radiograph showed a right upper lobe cavitation, and her blood work shows severe leukocytosis. BSE requested.    Assessment / Plan / Recommendation Clinical Impression  Clinical swallow evaluation completed at bedside. Pt denies difficulty swallowing and reports a variety of foods that she enjoys. She typically wears dentures, but does not have them at the hospital. Oral motor examination is WNL, with the exception of edentulous status. She  consumed ice chips, water via cup/straw, puree, and regular textures without incident. Pt without overt signs or symptoms of aspiration. OK to continue diet as ordered and SLP will keep on acute caseload for one more visit to ensure diet tolerance and safety with intake. Pt appreciative of visit.   SLP Visit Diagnosis: Dysphagia, unspecified (R13.10)    Aspiration Risk  Mild aspiration risk    Diet Recommendation Regular;Thin liquid   Liquid Administration via: Cup;Straw Medication Administration: Whole meds with liquid Supervision: Patient able to self feed;Intermittent supervision to cue for compensatory strategies Compensations: Follow solids with liquid Postural Changes: Seated upright at 90 degrees;Remain upright for at least 30 minutes after po intake    Other  Recommendations Oral Care Recommendations: Oral care BID;Staff/trained caregiver to provide oral care Other Recommendations: Clarify dietary restrictions   Follow up Recommendations None      Frequency and Duration min 2x/week  1 week       Prognosis Prognosis for Safe Diet Advancement: Good      Swallow Study   General Date of Onset: 01/04/20 HPI: [name] is a 86 y.o. female with medical history significant of obesity, HTN, dyslipidemia, hypertension, Hx of CVA, ambulatory dysfunction and seizures. Apparently patient lives by herself, today she was unable to get out of her bed, her neighbor found her in distress and called EMS. She was diagnosed with thrush and oral pain, not  able to eat well and having difficulty taking her oral medications. Patient was found tachycardic, tachypneic, with increased work of breathing. Her chest radiograph showed a right upper lobe cavitation, and her blood work shows severe leukocytosis. BSE requested.  Type of Study: Bedside Swallow Evaluation Previous Swallow Assessment: N/A Diet Prior to this Study: Regular;Thin liquids Temperature Spikes Noted: No Respiratory Status: Room  air History of Recent Intubation: No Behavior/Cognition: Alert;Cooperative;Pleasant mood Oral Cavity Assessment: Within Functional Limits Oral Care Completed by SLP: Recent completion by staff Oral Cavity - Dentition: Edentulous (not present) Vision: Functional for self-feeding Self-Feeding Abilities: Able to feed self Patient Positioning: Upright in bed Baseline Vocal Quality: Normal Volitional Cough: Strong Volitional Swallow: Able to elicit    Oral/Motor/Sensory Function Overall Oral Motor/Sensory Function: Within functional limits   Ice Chips Ice chips: Within functional limits Presentation: Spoon   Thin Liquid Thin Liquid: Within functional limits Presentation: Cup;Straw    Nectar Thick Nectar Thick Liquid: Not tested   Honey Thick Honey Thick Liquid: Not tested   Puree Puree: Within functional limits Presentation: Spoon   Solid     Solid: Within functional limits Presentation: Self Fed (min lingual residue) Other Comments: liquid wash helps     Thank you,  Genene Churn, [place]  Ngan Qualls 01/05/2020,3:16 PM

## 2020-01-05 NOTE — TOC Initial Note (Signed)
Transition of Care [facility]) - Initial/Assessment Note    Patient Details  Name: [name] MRN: [id number] Date of Birth: 1923-10-18  Transition of Care [facility]) CM/SW Contact:    Iona Beard, [place] Phone Number: 01/05/2020, 12:50 PM  Clinical Narrative:                 Pt admitted for pneumonia. Pt is from home and lives alone. Pt has a neighbor that checks in daily. CSW confirmed with pts daughter [name] that neighbor will continue checking in once pt has dc to home. Pts daughter stated that pt has a walker at cane and cannot think of any DME needs at this time. CSW informed pts daughter that OT is recommending [facility]. CSW contacted Tim with [facility] to give referral. Time with [facility] accepted referral for [facility] PT/OT. Will need orders at D/C. TOC to follow.   Expected Discharge Plan: [place] Barriers to Discharge: Continued Medical Work up   Patient Goals and CMS Choice Patient states their goals for this hospitalization and ongoing recovery are:: Return home with [facility]      Expected Discharge Plan and Services Expected Discharge Plan: [place]   Discharge Planning Services: NA Post Acute Care Choice: [place] arrangements for the past 2 months: Single Family Home                 DME Arranged: N/A DME Agency: NA       HH Arranged: PT, OT [place] Agency: [facility] (formerly [employer]) Date [place]: 01/05/20 Time [place]: 1215 Representative spoke with at [place]: Tim  Prior Living Arrangements/Services Living arrangements for the past 2 months: [place] with:: Self Patient language and need for interpreter reviewed:: Yes Do you feel safe going back to the place where you live?: Yes      Need for Family Participation in Patient Care: Yes (Comment) Care giver support system in place?: Yes (comment)   Criminal Activity/Legal Involvement Pertinent to Current  Situation/Hospitalization: No - Comment as needed  Activities of Daily Living      Permission Sought/Granted                  Emotional Assessment         Alcohol / Substance Use: Not Applicable Psych Involvement: No (comment)  Admission diagnosis:  Pneumonia [J18.9] AKI (acute kidney injury) ([place]) [N17.9] Community acquired pneumonia of right upper lobe of lung [J18.9] Patient Active Problem List   Diagnosis Date Noted  . Pneumonia 01/04/2020  . Class 2 obesity 01/04/2020  . CKD (chronic kidney disease) stage 3, GFR 30-59 ml/min 01/04/2020  . Nausea vomiting and diarrhea 03/12/2019  . Lung nodules 03/12/2019  . Atypical chest pain 12/31/2018  . Asthma 02/12/2017  . Chest pain 11/13/2016  . History of seizures 11/13/2016  . History of stroke 11/13/2016  . Allergic urticaria 08/30/2016  . GAD (generalized anxiety disorder) 08/30/2016  . Gout 08/30/2016  . Acute renal failure superimposed on stage 3 chronic kidney disease ([place]) 10/11/2015  . Neurogenic claudication   . Morbid obesity ([place])   . Edema   . Essential hypertension, benign 09/30/2014  . Secondary hyperparathyroidism, renal ([place]) 09/30/2014  . Proteinuria 09/30/2014  . Anemia associated with chronic renal failure 09/30/2014  . Chronic kidney disease (CKD), stage IV (severe) ([place]) 09/30/2014  . Hiatal hernia 08/21/2012  . Seizure disorder ([place]) 08/21/2012  PCP:  [name], [name] Pharmacy:   [id number] - [place], [address] [place] [address] Phone: [phone] Fax: [phone]     Social Determinants of Health (SDOH) Interventions    Readmission Risk Interventions No flowsheet data found.

## 2020-01-05 NOTE — Progress Notes (Signed)
PROGRESS NOTE    [name]  DGU:[id number] DOB: Jan 31, 1924 DOA: 01/04/2020 PCP: [name], [name]   Chief Complaint  Patient presents with   Weakness    Brief Narrative:  As per H&P written by Dr. [name] on 01/04/2020 [name] is a 86 y.o. female with medical history significant of obesity, HTN, dyslipidemia, hypertension, Hx of CVA, ambulatory dysfunction and seizures.  Apparently patient lives by herself, today she was unable to get out of her bed, her neighbor found her in distress and called EMS. Patient reports not feeling well for several years, she gets help from her neighbors for her meals, and uses a walker for ambulation. She mentions frequent falls and seizure events.  09/13 she was diagnosed with thrush and oral pain, not able to eat well and having difficulty taking her oral medications. Last night did no take her antiepileptic medications for this reason.   Patient denies any fevers, cough or frank dyspnea.   ED Course: Patient was found tachycardic, tachypneic, with increased work of breathing. Her chest radiograph showed a right upper lobe cavitation, and her blood work shows severe leukocytosis. Patient received cefepime, vancomycin, 1 L of normal saline and 5 mg of IV metoprolol. She was referred for admission for further evaluation and management.    Assessment & Plan: 1-severe sepsis due to right upper lobe pneumonia; endorgan damage acute on chronic renal failure (present on admission) -Appreciated source of infection on chest x-ray (possibly aspiration) -Patient with SIRS inflammatory markers including leukocytosis, tachypnea, tachycardia at time of admission. -Continue current antibiotic therapy -Continue IV fluid -Follow sepsis features and clinical response -Continue as needed nebulizer management and the use of flutter valve.  2-seizure disorder -No seizure activity appreciated -Continue Keppra.  3-thrush/right conjunctivitis -Continue  oral nystatin and the use of topical polymyxin for her right eye. -No purulence appreciated today on examination; improvement in oral thrush appreciated.  4-GERD/hiatal hernia -Continue PPI.  5-class II obesity -Body mass index is 38.04 kg/m. -Low calorie diet and portion control discussed with patient.  6-history of asthma -Appears to be stable and currently without acute exacerbation -Will continue the use of montelukast and fluticasone.  7-Essential hypertension, benign -Stable currently continue treatment with prednisone.  8-history of stroke -No new focal deficit appreciated -Continue risk factor modification -Continue aspirin for secondary prevention.  9-acute on chronic kidney disease a stage III b -Continue gentle fluid resuscitation -Follow renal function trend -Avoid the use of nephrotoxic agents and hypotension. -Based on repeat labs creatinine is trending down appropriately.    DVT prophylaxis: SCD's Code Status: Full code Family Communication: No family at bedside.  Unable to reach daughter over the phone; patient was very sharp during evaluation. Disposition:   Status is: Inpatient  Dispo: The patient is from: Home              Anticipated d/c is to: Home              Anticipated d/c date is: 1-2 days.              Patient currently is not medically stable for discharge; renal function still not at baseline, having ongoing nonproductive coughing spells and with reported by patient mild difficulty swallowing.       Consultants:   None   Procedures:  See below for x-ray reports   Antimicrobials:  Rocephin and Flagyl 01/04/2020   Subjective: Afebrile, no chest pain, no nausea, no vomiting.  Positive thrush appreciated inside  her mouth.  Right eye with active discharge (no purulence appreciated today).  Patient reports breathing is improving.  Still with intermittent dry coughing spells.  Objective: Vitals:   01/05/20 0818 01/05/20 0840  01/05/20 0900 01/05/20 1100  BP: (!) 115/49  (!) 114/50 122/66  Pulse: 96  (!) 45 85  Resp: 20  20 19   Temp: 97.8 F (36.6 C)  97.6 F (36.4 C) 97.9 F (36.6 C)  TempSrc:      SpO2: 98% 98% 94% 97%  Weight:      Height:        Intake/Output Summary (Last 24 hours) at 01/05/2020 1840 Last data filed at 01/05/2020 1700 Gross per 24 hour  Intake 843.75 ml  Output 550 ml  Net 293.75 ml   Filed Weights   01/04/20 2225  Weight: 97.4 kg    Examination:  General exam: Appears calm and comfortable; reports breathing easier, denies chest pain, no nausea, no vomiting, complaining of some mild difficulty swallowing and feeling her mouth to be dry and rough. Respiratory system: Positive scattered rhonchi right, no wheezing, no using accessory muscles.  Currently with good oxygen saturation on room air. Cardiovascular system: Rate controlled, no rubs, no gallops, no JVD. Gastrointestinal system: Abdomen is obese, nondistended, soft and nontender. No organomegaly or masses felt. Normal bowel sounds heard. Central nervous system: Alert and oriented. No focal neurological deficits. Extremities: No cyanosis or clubbing. Skin: No rashes, no petechiae. Psychiatry: Judgement and insight appear normal. Mood & affect appropriate.     Data Reviewed: I have personally reviewed following labs and imaging studies  CBC: Recent Labs  Lab 01/04/20 1414 01/05/20 0130 01/05/20 0622  WBC 20.3* 18.8* 16.8*  NEUTROABS  --  15.3*  --   HGB 10.0* 8.9* 8.8*  HCT 31.6* 28.2* 28.2*  MCV 73.8* 75.2* 76.2*  PLT 924* 826* 770*    Basic Metabolic Panel: Recent Labs  Lab 01/04/20 1414 01/05/20 0130 01/05/20 0622  NA 138 137 138  K 3.8 3.9 3.8  CL 99 102 104  CO2 25 25 25   GLUCOSE 107* 121* 100*  BUN 33* 31* 30*  CREATININE 1.72* 1.55* 1.54*  CALCIUM 10.1 9.2 9.1    GFR: Estimated Creatinine Clearance: 24.8 mL/min (A) (by C-G formula based on SCr of 1.54 mg/dL (H)).  Liver Function  Tests: Recent Labs  Lab 01/05/20 0130  AST 23  ALT 18  ALKPHOS 90  BILITOT 0.3  PROT 7.2  ALBUMIN 2.6*    CBG: No results for input(s): GLUCAP in the last 168 hours.   Recent Results (from the past 240 hour(s))  SARS Coronavirus 2 by RT PCR (hospital order, performed in [facility] hospital lab) Nasopharyngeal Nasopharyngeal Swab     Status: None   Collection Time: 01/04/20  1:13 PM   Specimen: Nasopharyngeal Swab  Result Value Ref Range Status   SARS Coronavirus 2 NEGATIVE NEGATIVE Final    Comment: (NOTE) SARS-CoV-2 target nucleic acids are NOT DETECTED.  The SARS-CoV-2 RNA is generally detectable in upper and lower respiratory specimens during the acute phase of infection. The lowest concentration of SARS-CoV-2 viral copies this assay can detect is 250 copies / mL. A negative result does not preclude SARS-CoV-2 infection and should not be used as the sole basis for treatment or other patient management decisions.  A negative result may occur with improper specimen collection / handling, submission of specimen other than nasopharyngeal swab, presence of viral mutation(s) within the areas targeted by this assay,  and inadequate number of viral copies (<250 copies / mL). A negative result must be combined with clinical observations, patient history, and epidemiological information.  Fact Sheet for Patients:   StrictlyIdeas.no  Fact Sheet for Healthcare Providers: BankingDealers.co.za  This test is not yet approved or  cleared by the [place] FDA and has been authorized for detection and/or diagnosis of SARS-CoV-2 by FDA under an Emergency Use Authorization (EUA).  This EUA will remain in effect (meaning this test can be used) for the duration of the COVID-19 declaration under Section 564(b)(1) of the Act, 21 U.S.C. section 360bbb-3(b)(1), unless the authorization is terminated or revoked sooner.  Performed at [facility], [address] [place], [place] [address]   Culture, blood (x 2)     Status: None (Preliminary result)   Collection Time: 01/04/20  7:07 PM   Specimen: Right Antecubital; Blood  Result Value Ref Range Status   Specimen Description RIGHT ANTECUBITAL  Final   Special Requests   Final    BOTTLES DRAWN AEROBIC AND ANAEROBIC Blood Culture adequate volume   Culture   Final    NO GROWTH < 12 HOURS Performed at [facility], [address], [place], [place] [address]    Report Status PENDING  Incomplete  Culture, blood (x 2)     Status: None (Preliminary result)   Collection Time: 01/04/20  7:28 PM   Specimen: BLOOD RIGHT FOREARM  Result Value Ref Range Status   Specimen Description BLOOD RIGHT FOREARM  Final   Special Requests   Final    BOTTLES DRAWN AEROBIC AND ANAEROBIC Blood Culture adequate volume   Culture   Final    NO GROWTH < 12 HOURS Performed at [facility], [address] [place], [place] [address]    Report Status PENDING  Incomplete      Radiology Studies: CT CHEST WO CONTRAST  Result Date: 01/04/2020 CLINICAL DATA:  Cough with weakness and tachycardia. Possible cavitary right upper lobe lesion on radiographs. EXAM: CT CHEST WITHOUT CONTRAST TECHNIQUE: Multidetector CT imaging of the chest was performed following the standard protocol without IV contrast. COMPARISON:  Radiographs today. CTA 03/12/2019. Thyroid ultrasound 06/02/2014. FINDINGS: Cardiovascular: Atherosclerosis of the aorta, great vessels and coronary arteries. Probable calcifications of the aortic valve. The heart size is normal. There is no pericardial effusion. Mediastinum/Nodes: There are no enlarged mediastinal, hilar or axillary lymph nodes.Hilar assessment is limited by the lack of intravenous contrast, although the hilar contours appear unchanged. Stable heterogeneous enlargement of both thyroid lobes, right greater than left. This has been previously evaluated by ultrasound. The trachea and  esophagus demonstrate no significant findings. Lungs/Pleura: There are new multifocal airspace opacities within the right upper lobe consistent with pneumonia. No evidence of cavitation or dominant mass. There is no significant pleural effusion or pneumothorax. Upper abdomen: The visualized upper abdomen appears stable without suspicious findings. There are probable small cysts in the upper poles of both kidneys. No adrenal mass. Musculoskeletal/Chest wall: There is no chest wall mass or suspicious osseous finding. Multilevel thoracic spondylosis with Schmorl's node formation in the superior endplate of Y50. Advanced bilateral shoulder arthropathy. IMPRESSION: 1. New multifocal airspace opacities within the right upper lobe consistent with pneumonia. Radiographic follow-up recommended to document resolution. 2. No evidence of cavitation or dominant mass to suggest neoplasm. 3. Coronary andAortic Atherosclerosis (ICD10-I70.0). Electronically Signed   By: Richardean Sale M.D.   On: 01/04/2020 16:16   DG Chest Port 1 View  Result Date: 01/04/2020 CLINICAL DATA:  Weakness and tachycardia. EXAM: PORTABLE CHEST 1 VIEW COMPARISON:  CTA chest and chest x-ray dated March 12, 2019. FINDINGS: The heart size and mediastinal contours are within normal limits. Normal pulmonary vascularity. New 5.3 cm lesion in the right upper lobe with central lucency, suggestive of cavitation. No pleural effusion or pneumothorax. No acute osseous abnormality. IMPRESSION: 1. Suspected new 5.3 cm cavitary lesion in the right upper lobe. CT of the chest with contrast is recommended for further evaluation. Electronically Signed   By: Titus Dubin M.D.   On: 01/04/2020 13:45    Scheduled Meds:  allopurinol  50 mg Oral Daily   aspirin EC  81 mg Oral Daily   budesonide  2 mL Nebulization BID   divalproex  500 mg Oral BID   levETIRAcetam  2,000 mg Oral QHS   loratadine  10 mg Oral Daily   metoprolol tartrate  5 mg Intravenous  Once   metoprolol tartrate  50 mg Oral BID   montelukast  10 mg Oral QHS   multivitamin with minerals  1 tablet Oral Q breakfast   nystatin  5 mL Oral QID   pantoprazole  40 mg Oral Daily   trimethoprim-polymyxin b  1 drop Right Eye Q6H   Continuous Infusions:  sodium chloride     cefTRIAXone (ROCEPHIN)  IV 2 g (01/05/20 1706)   metronidazole 500 mg (01/05/20 1815)     LOS: 1 day    Time spent: 35 minutes    Barton Dubois, MD [facility]   To contact the attending provider between 7A-7P or the covering provider during after hours 7P-7A, please log into the web site [email or web address] and access using universal [place] password for that web site. If you do not have the password, please call the hospital operator.  01/05/2020, 6:40 PM

## 2020-01-05 NOTE — Evaluation (Signed)
Physical Therapy Evaluation Patient Details Name: [name] MRN: [id number] DOB: 1923/09/15 Today's Date: 01/05/2020   History of Present Illness  [name] is a 86 y.o. female with medical history significant of obesity, HTN, dyslipidemia, hypertension, Hx of CVA, ambulatory dysfunction and seizures.     Clinical Impression  Patient functioning near baseline for functional mobility and gait, demonstrates good return for completing transfers without use of AD, tends to lean on on nearby objects for support when taking steps, required use of RW for safety and walked in hallway without loss of balance.   Patient continued sitting up in chair after therapy.  Patient will benefit from continued physical therapy in hospital and recommended venue below to increase strength, balance, endurance for safe ADLs and gait.      Follow Up Recommendations Home health PT;Supervision - Intermittent;Supervision for mobility/OOB    Equipment Recommendations  None recommended by PT    Recommendations for Other Services       Precautions / Restrictions Precautions Precautions: Fall Restrictions Weight Bearing Restrictions: No      Mobility  Bed Mobility Overal bed mobility: Modified Independent                Transfers Overall transfer level: Needs assistance Equipment used: Rolling walker (2 wheeled);1 person hand held assist Transfers: Sit to/from Stand;Stand Pivot Transfers Sit to Stand: Supervision;Min guard Stand pivot transfers: Min guard       General transfer comment: tends to lean on nearby objects for support, no loss of balance  Ambulation/Gait Ambulation/Gait assistance: Supervision Gait Distance (Feet): 85 Feet Assistive device: Rolling walker (2 wheeled) Gait Pattern/deviations: Decreased step length - right;Decreased step length - left;Decreased stride length;Trunk flexed Gait velocity: decreased   General Gait Details: slightly labored cadence without  loss of balance using RW, tends to lean on nearby objects for support without AD, usually uses a cane, limited secondary to fatigue  Stairs            Wheelchair Mobility    Modified Rankin (Stroke Patients Only)       Balance Overall balance assessment: Needs assistance Sitting-balance support: Feet supported;No upper extremity supported Sitting balance-Leahy Scale: Good Sitting balance - Comments: seated at EOB   Standing balance support: During functional activity;No upper extremity supported Standing balance-Leahy Scale: Fair Standing balance comment: fair/good using RW                             Pertinent Vitals/Pain Pain Assessment: No/denies pain    Home Living Family/patient expects to be discharged to:: Private residence Living Arrangements: Alone Available Help at Discharge: Family;Available PRN/intermittently Type of Home: House Home Access: Stairs to enter Entrance Stairs-Rails: Right Entrance Stairs-Number of Steps: 5-6 Home Layout: One level Home Equipment: Bedside commode;Walker - 2 wheels;Walker - 4 wheels;Cane - single point;Shower seat Additional Comments: some info taken from prior charting as patient unclear at times    Prior Function Level of Independence: Needs assistance   Gait / Transfers Assistance Needed: Household ambulator using SPC, occasional RW for longer distances  ADL's / Homemaking Assistance Needed: Daughter assists with tub transfers and washing difficult to reach areas, neighbor checks on patient daily        Hand Dominance   Dominant Hand: Right    Extremity/Trunk Assessment   Upper Extremity Assessment Upper Extremity Assessment: Defer to OT evaluation    Lower Extremity Assessment Lower Extremity Assessment: Generalized weakness  Cervical / Trunk Assessment Cervical / Trunk Assessment: Normal  Communication   Communication: No difficulties  Cognition Arousal/Alertness: Awake/alert Behavior  During Therapy: WFL for tasks assessed/performed Overall Cognitive Status: Within Functional Limits for tasks assessed                                        General Comments      Exercises     Assessment/Plan    PT Assessment Patient needs continued PT services  PT Problem List Decreased strength;Decreased activity tolerance;Decreased balance;Decreased mobility       PT Treatment Interventions Balance training;Gait training;Stair training;Functional mobility training;Therapeutic activities;Therapeutic exercise;Patient/family education    PT Goals (Current goals can be found in the Care Plan section)  Acute Rehab PT Goals Patient Stated Goal: return home with neighbors and daughter to assist PT Goal Formulation: With patient Time For Goal Achievement: 01/08/20 Potential to Achieve Goals: Good    Frequency Min 3X/week   Barriers to discharge        Co-evaluation               AM-PAC PT "6 Clicks" Mobility  Outcome Measure Help needed turning from your back to your side while in a flat bed without using bedrails?: None Help needed moving from lying on your back to sitting on the side of a flat bed without using bedrails?: None Help needed moving to and from a bed to a chair (including a wheelchair)?: A Little Help needed standing up from a chair using your arms (e.g., wheelchair or bedside chair)?: None Help needed to walk in hospital room?: A Little Help needed climbing 3-5 steps with a railing? : A Little 6 Click Score: 21    End of Session   Activity Tolerance: Patient tolerated treatment well;Patient limited by fatigue Patient left: in chair;with call bell/phone within reach Nurse Communication: Mobility status PT Visit Diagnosis: Unsteadiness on feet (R26.81);Other abnormalities of gait and mobility (R26.89);Muscle weakness (generalized) (M62.81)    Time: 1779-3903 PT Time Calculation (min) (ACUTE ONLY): 22 min   Charges:   PT  Evaluation $PT Eval Moderate Complexity: 1 Mod PT Treatments $Therapeutic Activity: 8-22 mins        2:48 PM, 01/05/20 Lonell Grandchild, MPT Physical Therapist with [facility] [phone] office [phone] mobile phone

## 2020-01-06 LAB — TROPONIN I (HIGH SENSITIVITY)
Troponin I (High Sensitivity): 15 ng/L (ref <18)
Troponin I (High Sensitivity): 14 ng/L (ref <18)

## 2020-01-06 LAB — BASIC METABOLIC PANEL
Anion gap: 13 (ref 5–15)
BUN: 26 mg/dL — ABNORMAL HIGH (ref 8–23)
CO2: 25 mmol/L (ref 22–32)
Calcium: 9.6 mg/dL (ref 8.9–10.3)
Chloride: 100 mmol/L (ref 98–111)
Creatinine, Ser: 1.38 mg/dL — ABNORMAL HIGH (ref 0.44–1.00)
GFR calc Af Amer: 38 mL/min — ABNORMAL LOW (ref >60)
GFR calc non Af Amer: 33 mL/min — ABNORMAL LOW (ref >60)
Glucose, Bld: 89 mg/dL (ref 70–99)
Potassium: 4 mmol/L (ref 3.5–5.1)
Sodium: 138 mmol/L (ref 135–145)

## 2020-01-06 LAB — CBC
HCT: 32.5 % — ABNORMAL LOW (ref 36.0–46.0)
Hemoglobin: 10.1 g/dL — ABNORMAL LOW (ref 12.0–15.0)
MCH: 23.3 pg — ABNORMAL LOW (ref 26.0–34.0)
MCHC: 31.1 g/dL (ref 30.0–36.0)
MCV: 75.1 fL — ABNORMAL LOW (ref 80.0–100.0)
Platelets: 791 10*3/uL — ABNORMAL HIGH (ref 150–400)
RBC: 4.33 MIL/uL (ref 3.87–5.11)
RDW: 18.6 % — ABNORMAL HIGH (ref 11.5–15.5)
WBC: 18.3 10*3/uL — ABNORMAL HIGH (ref 4.0–10.5)
nRBC: 0.4 % — ABNORMAL HIGH (ref 0.0–0.2)

## 2020-01-06 MED ORDER — GUAIFENESIN-DM 100-10 MG/5ML PO SYRP
5.0000 mL | ORAL_SOLUTION | ORAL | Status: DC | PRN
  Administered 2020-01-06: 5 mL via ORAL
  Filled 2020-01-06: qty 5

## 2020-01-06 MED ORDER — METOPROLOL TARTRATE 5 MG/5ML IV SOLN
5.0000 mg | Freq: Four times a day (QID) | INTRAVENOUS | Status: DC | PRN
  Filled 2020-01-06: qty 5

## 2020-01-06 MED ORDER — ASPIRIN 81 MG PO CHEW
CHEWABLE_TABLET | ORAL | Status: AC
  Filled 2020-01-06: qty 1

## 2020-01-06 MED ORDER — INFLUENZA VAC A&B SA ADJ QUAD 0.5 ML IM PRSY: 0.5000 mL | PREFILLED_SYRINGE | INTRAMUSCULAR | Status: DC

## 2020-01-06 MED ORDER — ASPIRIN 81 MG PO CHEW
324.0000 mg | CHEWABLE_TABLET | Freq: Once | ORAL | Status: AC
  Administered 2020-01-06: 324 mg via ORAL
  Filled 2020-01-06: qty 4

## 2020-01-06 MED ORDER — NITROGLYCERIN 0.4 MG SL SUBL
SUBLINGUAL_TABLET | SUBLINGUAL | Status: AC
  Administered 2020-01-06: 0.4 mg via SUBLINGUAL
  Filled 2020-01-06: qty 1

## 2020-01-06 MED ORDER — METOPROLOL TARTRATE 50 MG PO TABS
75.0000 mg | ORAL_TABLET | Freq: Two times a day (BID) | ORAL | Status: DC
  Administered 2020-01-06 – 2020-01-07 (×2): 75 mg via ORAL
  Filled 2020-01-06: qty 1

## 2020-01-06 MED ORDER — PNEUMOCOCCAL VAC POLYVALENT 25 MCG/0.5ML IJ INJ: 0.5000 mL | INJECTION | INTRAMUSCULAR | Status: DC

## 2020-01-06 MED ORDER — NITROGLYCERIN 0.4 MG SL SUBL: 0.4000 mg | SUBLINGUAL_TABLET | SUBLINGUAL | Status: DC | PRN

## 2020-01-06 NOTE — Progress Notes (Signed)
1000: notified by [employer] that pt's heartrate is up in 150-160 range sustained. In to assess pt, up in chair, c/o left shoulder pain. Skin warm and dry, pt denies c/o n/v or SOB. B/P 71/36, HR 156, resp 20, SaO2 91%. Head of chair reclined back, pt repositioned for comfort. Attempted IV restart x2, successful with #24g right wrist.  1006: Repeat B/P 91/58, HR remains 158, SaO2 94%.  1010: Dr. Manuella Ghazi notified of current assessment and in to room to assess. 1020: B/P 89/77, 154 bpm, resp 20/min. Lopressor 5mg  IV given per verbal order. 1025: B/P 89/74, HR 137, resp 20/min SaO2 95%. Pt continues to c/o left shoulder and arm pain.  1032: B/P 112/69, HR 102 bpm, saO2 92% resp 20/min, pt continues with c/o left arm pain. EKG completed and given to Dr. Manuella Ghazi. 5258: B/P 109/72, HR 101, resp 20/min, SaO2 92%. Pt states pain in left shoulder and arm is worse than before. Chest CTA, skin warm and dry. Denies any n/v. MD Manuella Ghazi notified and in room to eval.

## 2020-01-06 NOTE — Progress Notes (Signed)
1048: pt continues to c/o pain in left shoulder and elbow, states maybe just a little better but still moaning and praying out loud. Pt unable to rate pain. B/P 107/69, HR 101 bpm, resp 20/min, SaO2 95% on RA.  1050: SL Nitro 0.4mg  given per MD order. 1056: Pt states pain, "getting better", still unable to rate but no loner moaning and praying out loud. Moving left arm up and down at elbow. B/P 80/61, HR 99, SaO2 93%. MD Manuella Ghazi notified and in room to reevaluate pt.

## 2020-01-06 NOTE — Progress Notes (Signed)
Pt resting quietly in bed, sipping on water. Denies any c/o at present. Pt asked me to call her daughter [name], which I did and pt spoke with daughter.

## 2020-01-06 NOTE — Progress Notes (Signed)
PROGRESS NOTE    [name]  VHQ:[id number] DOB: 09/15/1923 DOA: 01/04/2020 PCP: [name], [name]   Brief Narrative:  As per H&P written by Dr. [name] on 01/04/2020 [name] a 86 y.o.femalewith medical history significant ofobesity, HTN, dyslipidemia, hypertension, Hx of CVA, ambulatory dysfunction and seizures. Apparently patient lives by herself, today she was unable to get out of her bed, her neighbor found her in distress and called EMS. Patient reports not feeling well for several years, she gets help from her neighbors for her meals, and uses a walker for ambulation. She mentions frequent falls and seizure events.  09/13 she was diagnosed with thrush and oral pain, not able to eat well and having difficulty taking her oral medications. Last night did no take her antiepileptic medications for this reason.  Patient denies any fevers, cough or frank dyspnea.  ED Course:Patient was found tachycardic, tachypneic,with increased work of breathing. Her chest radiograph showed a right upper lobe cavitation, and her blood work shows severe leukocytosis.Patient received cefepime, vancomycin, 1 L of normal saline and 5 mg of IV metoprolol. She was referred for admission for further evaluation and management.    9/22: Patient noted to have episodes of SVT this morning with some associated left shoulder pain as well as nausea and dyspnea.  EKG with some initial concerning findings, reviewed with cardiology with possible aberrancy.  Troponin within normal limits.  Assessment & Plan:   Principal Problem:   Pneumonia Active Problems:   Hiatal hernia   Seizure disorder (HCC)   Essential hypertension, benign   History of stroke   Asthma   Class 2 obesity   CKD (chronic kidney disease) stage 3, GFR 30-59 ml/min   severe sepsis due to right upper lobe pneumonia; endorgan damage acute on chronic renal failure (present on admission)-improving -Appreciated source of  infection on chest x-ray (possibly aspiration) -Patient with SIRS inflammatory markers including leukocytosis, tachypnea, tachycardia at time of admission. -Continue current antibiotic therapy -Continue IV fluid -Follow sepsis features and clinical response -Continue as needed nebulizer management and the use of flutter valve.  Atypical chest pain -Initial troponin 15 with repeat pending -EKG appears to show some SVT with aberrancy -Metoprolol dose increased to 75 mg twice daily, will need further telemetry monitoring -Possible chest pain related to demand ischemia which has now improved with heart rate control -Associated nausea for which patient is now on clear liquid diet which will advance as tolerated, IV Zofran as needed  seizure disorder -No seizure activity appreciated -Continue Keppra.  thrush/right conjunctivitis -Continue oral nystatin and the use of topical polymyxin for her right eye. -No purulence appreciated today on examination; improvement in oral thrush appreciated.  GERD/hiatal hernia -Continue PPI.  class II obesity -Body mass index is 38.04 kg/m. -Low calorie diet and portion control discussed with patient.  history of asthma -Appears to be stable and currently without acute exacerbation -Will continue the use of montelukast and fluticasone.  Essential hypertension, benign -Stable currently continue treatment with metoprolol and monitor  history of stroke -No new focal deficit appreciated -Continue risk factor modification -Continue aspirin for secondary prevention.  acute on chronic kidney disease a stage III b-improving -Follow renal function trend -Avoid the use of nephrotoxic agents and hypotension. -Based on repeat labs creatinine is trending down appropriately.   DVT prophylaxis:SCDs Code Status: Full Family Communication: Discussed with son on phone 9/22 Disposition Plan: Continue monitoring for any further SVTs.  Continue  treatment for aspiration pneumonia. Status  is: Inpatient  Remains inpatient appropriate because:IV treatments appropriate due to intensity of illness or inability to take PO and Inpatient level of care appropriate due to severity of illness   Dispo: The patient is from: Home              Anticipated d/c is to: Home              Anticipated d/c date is: 1 day              Patient currently is not medically stable to d/c.  Consultants:   Discussed with Cardiology 9/22  Procedures:   See below  Antimicrobials:  Anti-infectives (From admission, onward)   Start     Dose/Rate Route Frequency Ordered Stop   01/06/20 1600  vancomycin (VANCOREADY) IVPB 1500 mg/300 mL  Status:  Discontinued        1,500 mg 150 mL/hr over 120 Minutes Intravenous Every 48 hours 01/04/20 1504 01/04/20 1703   01/04/20 1715  cefTRIAXone (ROCEPHIN) 2 g in sodium chloride 0.9 % 100 mL IVPB        2 g 200 mL/hr over 30 Minutes Intravenous Every 24 hours 01/04/20 1700     01/04/20 1700  metroNIDAZOLE (FLAGYL) IVPB 500 mg        500 mg 100 mL/hr over 60 Minutes Intravenous Every 8 hours 01/04/20 1700     01/04/20 1500  vancomycin (VANCOREADY) IVPB 2000 mg/400 mL  Status:  Discontinued        2,000 mg 200 mL/hr over 120 Minutes Intravenous  Once 01/04/20 1445 01/04/20 1703   01/04/20 1430  ceFEPIme (MAXIPIME) 2 g in sodium chloride 0.9 % 100 mL IVPB        2 g 200 mL/hr over 30 Minutes Intravenous  Once 01/04/20 1420 01/04/20 1708       Subjective: Patient seen and evaluated today with no new acute complaints or concerns. No acute concerns or events noted overnight.  Objective: Vitals:   01/05/20 2100 01/06/20 0400 01/06/20 0821 01/06/20 1132  BP: 109/65 116/68  118/76  Pulse: 91 85  (!) 104  Resp: 20 18  20   Temp: 98.8 F (37.1 C) 98.3 F (36.8 C)    TempSrc:  Oral    SpO2: 96% 96% 93% 91%  Weight:      Height:        Intake/Output Summary (Last 24 hours) at 01/06/2020 1218 Last data filed at  01/06/2020 0600 Gross per 24 hour  Intake 600 ml  Output 550 ml  Net 50 ml   Filed Weights   01/04/20 2225  Weight: 97.4 kg    Examination:  General exam: Appears to be in moderate distress this am Respiratory system: Clear to auscultation. Respiratory effort normal. Cardiovascular system: S1 & S2 heard, RRR. Tachycardic Gastrointestinal system: Abdomen is nondistended, soft and nontender.  Central nervous system: Alert and oriented. No focal neurological deficits. Extremities: No edema Skin: No rashes, lesions or ulcers Psychiatry: Flat affect    Data Reviewed: I have personally reviewed following labs and imaging studies  CBC: Recent Labs  Lab 01/04/20 1414 01/05/20 0130 01/05/20 0622 01/06/20 0356  WBC 20.3* 18.8* 16.8* 18.3*  NEUTROABS  --  15.3*  --   --   HGB 10.0* 8.9* 8.8* 10.1*  HCT 31.6* 28.2* 28.2* 32.5*  MCV 73.8* 75.2* 76.2* 75.1*  PLT 924* 826* 770* 149*   Basic Metabolic Panel: Recent Labs  Lab 01/04/20 1414 01/05/20 0130 01/05/20 0622 01/06/20 0356  NA 138 137 138 138  K 3.8 3.9 3.8 4.0  CL 99 102 104 100  CO2 25 25 25 25   GLUCOSE 107* 121* 100* 89  BUN 33* 31* 30* 26*  CREATININE 1.72* 1.55* 1.54* 1.38*  CALCIUM 10.1 9.2 9.1 9.6   GFR: Estimated Creatinine Clearance: 27.7 mL/min (A) (by C-G formula based on SCr of 1.38 mg/dL (H)). Liver Function Tests: Recent Labs  Lab 01/05/20 0130  AST 23  ALT 18  ALKPHOS 90  BILITOT 0.3  PROT 7.2  ALBUMIN 2.6*   No results for input(s): LIPASE, AMYLASE in the last 168 hours. No results for input(s): AMMONIA in the last 168 hours. Coagulation Profile: Recent Labs  Lab 01/05/20 0130  INR 1.3*   Cardiac Enzymes: No results for input(s): CKTOTAL, CKMB, CKMBINDEX, TROPONINI in the last 168 hours. BNP (last 3 results) No results for input(s): PROBNP in the last 8760 hours. HbA1C: No results for input(s): HGBA1C in the last 72 hours. CBG: No results for input(s): GLUCAP in the last 168  hours. Lipid Profile: Recent Labs    01/05/20 0629  CHOL 90  HDL 26*  LDLCALC 49  TRIG 73  CHOLHDL 3.5   Thyroid Function Tests: Recent Labs    01/04/20 1414  TSH 1.265   Anemia Panel: No results for input(s): VITAMINB12, FOLATE, FERRITIN, TIBC, IRON, RETICCTPCT in the last 72 hours. Sepsis Labs: Recent Labs  Lab 01/05/20 0125  LATICACIDVEN 1.3    Recent Results (from the past 240 hour(s))  SARS Coronavirus 2 by RT PCR (hospital order, performed in [facility] hospital lab) Nasopharyngeal Nasopharyngeal Swab     Status: None   Collection Time: 01/04/20  1:13 PM   Specimen: Nasopharyngeal Swab  Result Value Ref Range Status   SARS Coronavirus 2 NEGATIVE NEGATIVE Final    Comment: (NOTE) SARS-CoV-2 target nucleic acids are NOT DETECTED.  The SARS-CoV-2 RNA is generally detectable in upper and lower respiratory specimens during the acute phase of infection. The lowest concentration of SARS-CoV-2 viral copies this assay can detect is 250 copies / mL. A negative result does not preclude SARS-CoV-2 infection and should not be used as the sole basis for treatment or other patient management decisions.  A negative result may occur with improper specimen collection / handling, submission of specimen other than nasopharyngeal swab, presence of viral mutation(s) within the areas targeted by this assay, and inadequate number of viral copies (<250 copies / mL). A negative result must be combined with clinical observations, patient history, and epidemiological information.  Fact Sheet for Patients:   StrictlyIdeas.no  Fact Sheet for Healthcare Providers: BankingDealers.co.za  This test is not yet approved or  cleared by the [place] FDA and has been authorized for detection and/or diagnosis of SARS-CoV-2 by FDA under an Emergency Use Authorization (EUA).  This EUA will remain in effect (meaning this test can be used) for the  duration of the COVID-19 declaration under Section 564(b)(1) of the Act, 21 U.S.C. section 360bbb-3(b)(1), unless the authorization is terminated or revoked sooner.  Performed at [facility], [address] [place], [place] [address]   Culture, blood (x 2)     Status: None (Preliminary result)   Collection Time: 01/04/20  7:07 PM   Specimen: Right Antecubital; Blood  Result Value Ref Range Status   Specimen Description RIGHT ANTECUBITAL  Final   Special Requests   Final    BOTTLES DRAWN AEROBIC AND ANAEROBIC Blood Culture adequate volume   Culture  Final    NO GROWTH 2 DAYS Performed at [facility], [address], [place], [place] [address]    Report Status PENDING  Incomplete  Culture, blood (x 2)     Status: None (Preliminary result)   Collection Time: 01/04/20  7:28 PM   Specimen: BLOOD RIGHT FOREARM  Result Value Ref Range Status   Specimen Description BLOOD RIGHT FOREARM  Final   Special Requests   Final    BOTTLES DRAWN AEROBIC AND ANAEROBIC Blood Culture adequate volume   Culture   Final    NO GROWTH 2 DAYS Performed at [facility], [address] [place], [place] [address]    Report Status PENDING  Incomplete         Radiology Studies: CT CHEST WO CONTRAST  Result Date: 01/04/2020 CLINICAL DATA:  Cough with weakness and tachycardia. Possible cavitary right upper lobe lesion on radiographs. EXAM: CT CHEST WITHOUT CONTRAST TECHNIQUE: Multidetector CT imaging of the chest was performed following the standard protocol without IV contrast. COMPARISON:  Radiographs today. CTA 03/12/2019. Thyroid ultrasound 06/02/2014. FINDINGS: Cardiovascular: Atherosclerosis of the aorta, great vessels and coronary arteries. Probable calcifications of the aortic valve. The heart size is normal. There is no pericardial effusion. Mediastinum/Nodes: There are no enlarged mediastinal, hilar or axillary lymph nodes.Hilar assessment is limited by the lack of intravenous contrast, although the  hilar contours appear unchanged. Stable heterogeneous enlargement of both thyroid lobes, right greater than left. This has been previously evaluated by ultrasound. The trachea and esophagus demonstrate no significant findings. Lungs/Pleura: There are new multifocal airspace opacities within the right upper lobe consistent with pneumonia. No evidence of cavitation or dominant mass. There is no significant pleural effusion or pneumothorax. Upper abdomen: The visualized upper abdomen appears stable without suspicious findings. There are probable small cysts in the upper poles of both kidneys. No adrenal mass. Musculoskeletal/Chest wall: There is no chest wall mass or suspicious osseous finding. Multilevel thoracic spondylosis with Schmorl's node formation in the superior endplate of O97. Advanced bilateral shoulder arthropathy. IMPRESSION: 1. New multifocal airspace opacities within the right upper lobe consistent with pneumonia. Radiographic follow-up recommended to document resolution. 2. No evidence of cavitation or dominant mass to suggest neoplasm. 3. Coronary andAortic Atherosclerosis (ICD10-I70.0). Electronically Signed   By: Richardean Sale M.D.   On: 01/04/2020 16:16   DG Chest Port 1 View  Result Date: 01/04/2020 CLINICAL DATA:  Weakness and tachycardia. EXAM: PORTABLE CHEST 1 VIEW COMPARISON:  CTA chest and chest x-ray dated March 12, 2019. FINDINGS: The heart size and mediastinal contours are within normal limits. Normal pulmonary vascularity. New 5.3 cm lesion in the right upper lobe with central lucency, suggestive of cavitation. No pleural effusion or pneumothorax. No acute osseous abnormality. IMPRESSION: 1. Suspected new 5.3 cm cavitary lesion in the right upper lobe. CT of the chest with contrast is recommended for further evaluation. Electronically Signed   By: Titus Dubin M.D.   On: 01/04/2020 13:45        Scheduled Meds: . allopurinol  50 mg Oral Daily  . aspirin  324 mg Oral  Once  . aspirin EC  81 mg Oral Daily  . budesonide  2 mL Nebulization BID  . divalproex  500 mg Oral BID  . levETIRAcetam  2,000 mg Oral QHS  . loratadine  10 mg Oral Daily  . metoprolol tartrate  75 mg Oral BID  . montelukast  10 mg Oral QHS  . multivitamin with minerals  1 tablet Oral  Q breakfast  . nystatin  5 mL Oral QID  . pantoprazole  40 mg Oral Daily  . trimethoprim-polymyxin b  1 drop Right Eye Q6H   Continuous Infusions: . cefTRIAXone (ROCEPHIN)  IV 2 g (01/05/20 1706)  . metronidazole 500 mg (01/06/20 1124)     LOS: 2 days    Time spent: 40 minutes    Emree Locicero D Manuella Ghazi, DO [facility]  If 7PM-7AM, please contact night-coverage [email or web address] 01/06/2020, 12:18 PM

## 2020-01-06 NOTE — Progress Notes (Signed)
1205: Pt able to ambulate to bed with only standby assistance. No chest pain, no SOB, no dizziness. VSS. MD Manuella Ghazi notified of status, no new orders. 1245: Pt resting quietly in bed, HR 101 ST, resp 18, SaO2 96% on room air. Pt denies any complaint at this time, states her shoulder and arm, "are feeling much better".

## 2020-01-06 NOTE — Progress Notes (Signed)
1800: Pt requested to be up in chair for supper. States she can't eat laying in bed. Pt up OOB with only standby assist, ambulated to chair without difficulty, denied pain or SOB. Pt's tray set up and pt eating, no c/o N/V. 1920: Call from [place] states pt's heart rate is 150-160 bpm. Pt still up in chair, finished meal. Denies c/o, no pain or SOB. Skin warm and dry. Direct visualization of tele show HR 156 bpm, apical heart rate > 130. Chest CTA. 1930: Pt's heart rate now 112 bpm ST, B/P 135/75, SaO2 100% on RA. Pt asks to get back in bed, able to get up and back into bed with only standby assistance. Tolerated well.

## 2020-01-06 NOTE — Progress Notes (Signed)
In to set pt up for breakfast, found pt lying naked in bed, tele leads off and IV pulled out, purewick lying in floor. Linens wet from IVF and pt incontinent if urine. When questioned, pt states, "I'm just an old woman. I don't know why I do what I do." Pt able to answer all orientation questions correctly, but has wandering speech at times. Pt bathed, bed linens changed, new purewick applied. Tele reapplied. Pt set up for breakfast. States, "Thank y'all so much!". Will restart IV when pt finishes breakfast.

## 2020-01-06 NOTE — Progress Notes (Signed)
SLP Cancellation Note  Patient Details Name: [name] MRN: [id number] DOB: 11/27/1923   Cancelled treatment:       Reason Eval/Treat Not Completed: Other (comment); Pt changed to clear liquids due to chest pain earlier today. When diet resumed, please advance to D3/mech soft and thin. She was on regular textures, however she was unable to [facility] well due to lack of dentures here. Above d/w RN and Pt. SLP will follow.  Thank you,  Genene Churn, [place]    [place] 01/06/2020, 1:01 PM

## 2020-01-06 NOTE — Progress Notes (Signed)
Physical Therapy Treatment Patient Details Name: [name] MRN: [id number] DOB: 1923-10-27 Today's Date: 01/06/2020    History of Present Illness [name] is a 86 y.o. female with medical history significant of obesity, HTN, dyslipidemia, hypertension, Hx of CVA, ambulatory dysfunction and seizures.     PT Comments    Pt sitting with HOB elevated with breakfast tray, reports she can't eat it because the food is too tough and she doesn't have her dentures. Pt agreeable to exercise and requests to get to bedside chair. Pt tolerates seated BLE strengthening exercises with intermittent cues for decreased momentum. Pt able to rise from EOB without physical assist, declines to ambulate further due to being too tired after exercising and requests to sit up in chair. Pt denies pain or lightheadedness with mobility, only feeling weak and tired after exercise. Pt tolerates remaining up in chair with chair alarm on and call bell in hand. Patient will benefit from continued physical therapy in hospital and recommendations below to increase strength, balance, endurance for safe ADLs and gait.    Follow Up Recommendations  Home health PT;Supervision - Intermittent;Supervision for mobility/OOB     Equipment Recommendations  None recommended by PT    Recommendations for Other Services       Precautions / Restrictions Precautions Precautions: Fall Restrictions Weight Bearing Restrictions: No    Mobility  Bed Mobility Overal bed mobility: Modified Independent  General bed mobility comments: increased time to come to sitting EOB, minimal use of bedrail to scoot to EOB  Transfers Overall transfer level: Needs assistance Equipment used: Rolling walker (2 wheeled) Transfers: Sit to/from [facility] Sit to Stand: Supervision Stand pivot transfers: Supervision    General transfer comment: powers up from sitting at EOB without physical assist, RW for steadying while  pivoting over to bedside chair  Ambulation/Gait Ambulation/Gait assistance: Supervision  Assistive device: Rolling walker (2 wheeled) Gait Pattern/deviations: Decreased step length - right;Decreased step length - left;Decreased stride length;Trunk flexed  General Gait Details: limited to 3-4 short steps at bedside then pt reports feeling "too tired" to ambulate further and requests to remain up in bedside chair   Stairs             Wheelchair Mobility    Modified Rankin (Stroke Patients Only)       Balance Overall balance assessment: Needs assistance Sitting-balance support: Feet supported;No upper extremity supported Sitting balance-Leahy Scale: Good Sitting balance - Comments: seated EOB   Standing balance support: During functional activity;Bilateral upper extremity supported Standing balance-Leahy Scale: Poor Standing balance comment: static without UE support, dynamic with RW for support       Cognition Arousal/Alertness: Awake/alert Behavior During Therapy: WFL for tasks assessed/performed Overall Cognitive Status: Within Functional Limits for tasks assessed       Exercises General Exercises - Lower Extremity Long Arc Quad: AROM;Strengthening;Both;20 reps;Seated Hip Flexion/Marching: AROM;Strengthening;Both;20 reps;Seated Toe Raises: AROM;Strengthening;Both;20 reps;Seated Heel Raises: AROM;Strengthening;Both;20 reps;Seated    General Comments        Pertinent Vitals/Pain Pain Assessment: No/denies pain    Home Living               Prior Function            PT Goals (current goals can now be found in the care plan section) Acute Rehab PT Goals Patient Stated Goal: return home with neighbors and daughter to assist PT Goal Formulation: With patient Time For Goal Achievement: 01/08/20 Potential to Achieve Goals: Good Progress towards PT  goals: Progressing toward goals    Frequency    Min 3X/week      PT Plan Current plan remains  appropriate    Co-evaluation              AM-PAC PT "6 Clicks" Mobility   Outcome Measure  Help needed turning from your back to your side while in a flat bed without using bedrails?: None Help needed moving from lying on your back to sitting on the side of a flat bed without using bedrails?: None Help needed moving to and from a bed to a chair (including a wheelchair)?: None Help needed standing up from a chair using your arms (e.g., wheelchair or bedside chair)?: None Help needed to walk in hospital room?: A Little Help needed climbing 3-5 steps with a railing? : A Little 6 Click Score: 22    End of Session   Activity Tolerance: Patient tolerated treatment well;Patient limited by fatigue Patient left: in chair;with call bell/phone within reach;with chair alarm set Nurse Communication: Mobility status PT Visit Diagnosis: Unsteadiness on feet (R26.81);Other abnormalities of gait and mobility (R26.89);Muscle weakness (generalized) (M62.81)     Time: 9539-6728 PT Time Calculation (min) (ACUTE ONLY): 20 min  Charges:  $Therapeutic Exercise: 8-22 mins                      Tori Kalyna Paolella PT, DPT 01/06/20, 11:24 AM [phone]

## 2020-01-06 NOTE — Progress Notes (Signed)
1110: B/P 141/82, HR 106, SaO2 96%, resp 18/min. Pt noted to be diaphoretic but skin color WNL, pt began coughing then vomited moderate amount of bile colored emesis x2. Pt denies feeling nauseated or sick to stomach. MD Manuella Ghazi notified and orders received. 1120: Zofran 4 mg IV given per order, no further vomiting noted at this time, pt's skin dry and color WNL. Pt states that left shoulder and arm pain, "is better, but it comes and goes". Pt no loner restless or moaning as she was earlier this am, resting in reclined position in chair. 1131: Pt resting quietly in recliner. Current b/p 118/76, HR 102, SaO2 94% RA, resp 18/min. No further n/v noted. Skin warm and dry, chest CTA. 1149: B/P 118/59, heartrate 109 ST, resp 18/min, SaO2 96%. Pt currently denies c/o except for "off and on" pain in her shoulders and left arm.

## 2020-01-07 LAB — BASIC METABOLIC PANEL
Anion gap: 10 (ref 5–15)
BUN: 28 mg/dL — ABNORMAL HIGH (ref 8–23)
CO2: 25 mmol/L (ref 22–32)
Calcium: 9 mg/dL (ref 8.9–10.3)
Chloride: 104 mmol/L (ref 98–111)
Creatinine, Ser: 1.44 mg/dL — ABNORMAL HIGH (ref 0.44–1.00)
GFR calc Af Amer: 36 mL/min — ABNORMAL LOW (ref >60)
GFR calc non Af Amer: 31 mL/min — ABNORMAL LOW (ref >60)
Glucose, Bld: 82 mg/dL (ref 70–99)
Potassium: 4.2 mmol/L (ref 3.5–5.1)
Sodium: 139 mmol/L (ref 135–145)

## 2020-01-07 LAB — MAGNESIUM: Magnesium: 1.8 mg/dL (ref 1.7–2.4)

## 2020-01-07 LAB — CBC
HCT: 29 % — ABNORMAL LOW (ref 36.0–46.0)
Hemoglobin: 8.9 g/dL — ABNORMAL LOW (ref 12.0–15.0)
MCH: 23 pg — ABNORMAL LOW (ref 26.0–34.0)
MCHC: 30.7 g/dL (ref 30.0–36.0)
MCV: 74.9 fL — ABNORMAL LOW (ref 80.0–100.0)
Platelets: 776 10*3/uL — ABNORMAL HIGH (ref 150–400)
RBC: 3.87 MIL/uL (ref 3.87–5.11)
RDW: 18.6 % — ABNORMAL HIGH (ref 11.5–15.5)
WBC: 14.3 10*3/uL — ABNORMAL HIGH (ref 4.0–10.5)
nRBC: 0.5 % — ABNORMAL HIGH (ref 0.0–0.2)

## 2020-01-07 MED ORDER — METOPROLOL TARTRATE 75 MG PO TABS: 75.0000 mg | ORAL_TABLET | Freq: Two times a day (BID) | ORAL | 2 refills | Status: DC

## 2020-01-07 MED ORDER — CEFDINIR 300 MG PO CAPS: 300.0000 mg | ORAL_CAPSULE | Freq: Two times a day (BID) | ORAL | 0 refills | Status: DC

## 2020-01-07 NOTE — Progress Notes (Signed)
Students had patient up in chair today.  Denied shortness of breath or coughing.  Discharged home with home PT and OT.   IV's removed and discharge instructions reviewed with daughter.  Follow-up appointments made and scripts sent to pharmacy.  Transported by WC to entrance.  Daughter to drive home

## 2020-01-07 NOTE — Discharge Summary (Signed)
Physician Discharge Summary  [name]:[id number] DOB: 29-Jun-1923 DOA: 01/04/2020  PCP: [name], [name]  Admit date: 01/04/2020  Discharge date: 01/07/2020  Admitted From:Home  Disposition:  Home  Recommendations for Outpatient Follow-up:  1. Follow up with PCP in 1-2 weeks 2. Please obtain BMP/CBC in one week 3. Continue on cefdinir as prescribed for 4 more days to complete 7-day course of treatment 4. Continue on metoprolol dosing of 75 mg twice daily for better heart rate control 5. Continue oral nystatin and topical polymyxin treatment for thrush and right eye conjunctivitis as prescribed 6. Continue on other home medications as prior  Home Health: Yes with PT  Equipment/Devices: Cane  Discharge Condition: Stable  CODE STATUS: Full  Diet recommendation: Heart Healthy  Brief/Interim Summary:As per H&P written by Dr. [name] on 01/04/2020 [name] a 86 y.o.femalewith medical history significant ofobesity, HTN, dyslipidemia, hypertension, Hx of CVA, ambulatory dysfunction and seizures. Apparently patient lives by herself, today she was unable to get out of her bed, her neighbor found her in distress and called EMS. Patient reports not feeling well for several years, she gets help from her neighbors for her meals, and uses a walker for ambulation. She mentions frequent falls and seizure events.  09/13 she was diagnosed with thrush and oral pain, not able to eat well and having difficulty taking her oral medications. Last night did no take her antiepileptic medications for this reason.  Patient denies any fevers, cough or frank dyspnea.  ED Course:Patient was found tachycardic, tachypneic,with increased work of breathing. Her chest radiograph showed a right upper lobe cavitation, and her blood work shows severe leukocytosis.Patient received cefepime, vancomycin, 1 L of normal saline and 5 mg of IV metoprolol. She was referred for admission  forfurther evaluation and management.  9/22: Patient noted to have episodes of SVT this morning with some associated left shoulder pain as well as nausea and dyspnea.  EKG with some initial concerning findings, reviewed with cardiology with possible aberrancy.  Troponin within normal limits.  9/23: Patient has been receiving Rocephin and Flagyl during the course of this admission for treatment of presumed aspiration pneumonia.  She appears to be doing quite well this morning and has no further significant symptomatology related to her SVT, chest pain, or pneumonia.  She is in stable condition for discharge and PT has recommended home health physical therapy.  Her metoprolol dose has been increased to 75 twice daily since 9/22 and her heart rate is very well controlled this morning.  She is eager for discharge and she appears stable enough to continue on oral medications for the rest of the course.  No other acute events noted throughout the course of this admission and she will follow up with her PCP in the very near future.  Discharge Diagnoses:  Principal Problem:   Pneumonia Active Problems:   Hiatal hernia   Seizure disorder ([name])   Essential hypertension, benign   History of stroke   Asthma   Class 2 obesity   CKD (chronic kidney disease) stage 3, GFR 30-59 ml/min  Principal discharge diagnosis: Severe sepsis secondary to right upper lobe pneumonia with presumed aspiration.  Recurrent episodes of SVT with atypical chest pain-resolved.  Discharge Instructions  Discharge Instructions    Diet - low sodium heart healthy   Complete by: As directed    Increase activity slowly   Complete by: As directed      Allergies as of 01/07/2020  Reactions   Advicor [niacin-lovastatin Er] Other (See Comments)   Reaction unknown to daughter   Caduet [amlodipine-atorvastatin] Other (See Comments)   Reaction unknown to daughter   Codeine Other (See Comments)   Reaction unknown to daughter    Dilantin [phenytoin] Other (See Comments)   Reaction unknown to daughter   Lescol [fluvastatin] Other (See Comments)   Reaction unknown to daughter   Pamelor [nortriptyline] Other (See Comments)   Reaction unknown to daughter   Penicillins Hives   Did it involve swelling of the face/tongue/throat, SOB, or low BP? Unk Did it involve sudden or severe rash/hives, skin peeling, or any reaction on the inside of your mouth or nose? Unk Did you need to seek medical attention at a hospital or doctor's office? Unk When did it last happen? "it was a long time ago" If all above answers are NO, may proceed with cephalosporin use.   Sulfa Antibiotics Hives   Trileptal [oxcarbazepine] Other (See Comments)   Reaction unknown to daughter   Ultram [tramadol] Other (See Comments)   Reaction unknown to daughter   Welchol [colesevelam] Other (See Comments)   Reaction unknown to daughter   Latex Rash   Tomato Rash      Medication List    TAKE these medications   acetaminophen 500 MG tablet Commonly known as: TYLENOL Take 500-1,000 mg by mouth every 8 (eight) hours as needed for mild pain or headache.   allopurinol 100 MG tablet Commonly known as: ZYLOPRIM TAKE 1/2 TABLET BY MOUTH EVERY DAY   Arnuity Ellipta 200 MCG/ACT Aepb Generic drug: Fluticasone Furoate INHALE 1 PUFF BY MOUTH EVERY DAY What changed:   how much to take  how to take this  when to take this  additional instructions   aspirin 81 MG tablet Take 81 mg by mouth daily.   cefdinir 300 MG capsule Commonly known as: OMNICEF Take 1 capsule (300 mg total) by mouth 2 (two) times daily for 4 days.   cetirizine 10 MG tablet Commonly known as: ZYRTEC TAKE 1 TABLET BY MOUTH EVERY DAY   divalproex 250 MG DR tablet Commonly known as: DEPAKOTE TAKE ONE TABLET BY MOUTH TWICE A DAY FOR 2 WEEKS, THEN TAKE 2 TABLETS TWICE A DAY THEREAFTER What changed:   how much to take  how to take this  when to take  this  additional instructions   famotidine 40 MG tablet Commonly known as: PEPCID Take 1 tablet (40 mg total) by mouth daily. What changed:   when to take this  reasons to take this   ketoconazole 2 % cream Commonly known as: NIZORAL Apply to the affected areas underneath breasts, belly and in groin region ONCE daily x2-4 weeks. What changed:   how much to take  how to take this  when to take this  additional instructions   levETIRAcetam 500 MG 24 hr tablet Commonly known as: KEPPRA XR Take 4 tablets (2,000 mg total) by mouth at bedtime.   metolazone 2.5 MG tablet Commonly known as: ZAROXOLYN Take 1 Tablet by mouth on Mondays, Wednesdays and Fridays   Metoprolol Tartrate 75 MG Tabs Take 75 mg by mouth 2 (two) times daily. What changed:   medication strength  how much to take  additional instructions   mometasone 0.1 % ointment Commonly known as: ELOCON Apply to skin once daily as directed after bathing.   montelukast 10 MG tablet Commonly known as: SINGULAIR TAKE 1 TABLET BY MOUTH EVERYDAY AT BEDTIME What changed:  how much to take  how to take this  when to take this  additional instructions   nystatin 100000 UNIT/ML suspension Commonly known as: MYCOSTATIN Take 5 mLs (500,000 Units total) by mouth 4 (four) times daily.   nystatin-triamcinolone ointment Commonly known as: MYCOLOG Apply 1 application topically 2 (two) times daily.   omeprazole 40 MG capsule Commonly known as: PriLOSEC Take 1 capsule (40 mg total) by mouth daily. What changed:   when to take this  reasons to take this   One-A-Day Womens 50 Plus Tabs Take 1 tablet by mouth daily with breakfast.   triamcinolone cream 0.1 % Commonly known as: KENALOG Apply 1 application topically 2 (two) times daily. What changed:   when to take this  reasons to take this   trimethoprim-polymyxin b ophthalmic solution Commonly known as: Polytrim Place 1 drop into the right eye  every 6 (six) hours.       Follow-up Information    [name], [name] Follow up in 1 week(s).   Specialty: Family Medicine Contact information: [place] [address] [phone]              Allergies  Allergen Reactions   Advicor [Niacin-Lovastatin Er] Other (See Comments)    Reaction unknown to daughter   Caduet [Amlodipine-Atorvastatin] Other (See Comments)    Reaction unknown to daughter   Codeine Other (See Comments)    Reaction unknown to daughter   Dilantin [Phenytoin] Other (See Comments)    Reaction unknown to daughter   Lescol [Fluvastatin] Other (See Comments)    Reaction unknown to daughter   Pamelor [Nortriptyline] Other (See Comments)    Reaction unknown to daughter   Penicillins Hives    Did it involve swelling of the face/tongue/throat, SOB, or low BP? Unk Did it involve sudden or severe rash/hives, skin peeling, or any reaction on the inside of your mouth or nose? Unk Did you need to seek medical attention at a hospital or doctor's office? Unk When did it last happen? "it was a long time ago" If all above answers are NO, may proceed with cephalosporin use.    Sulfa Antibiotics Hives   Trileptal [Oxcarbazepine] Other (See Comments)    Reaction unknown to daughter   Ultram [Tramadol] Other (See Comments)    Reaction unknown to daughter   Welchol [Colesevelam] Other (See Comments)    Reaction unknown to daughter   Latex Rash   Tomato Rash    Consultations:  None   Procedures/Studies: CT CHEST WO CONTRAST  Result Date: 01/04/2020 CLINICAL DATA:  Cough with weakness and tachycardia. Possible cavitary right upper lobe lesion on radiographs. EXAM: CT CHEST WITHOUT CONTRAST TECHNIQUE: Multidetector CT imaging of the chest was performed following the standard protocol without IV contrast. COMPARISON:  Radiographs today. CTA 03/12/2019. Thyroid ultrasound 06/02/2014. FINDINGS: Cardiovascular: Atherosclerosis of  the aorta, great vessels and coronary arteries. Probable calcifications of the aortic valve. The heart size is normal. There is no pericardial effusion. Mediastinum/Nodes: There are no enlarged mediastinal, hilar or axillary lymph nodes.Hilar assessment is limited by the lack of intravenous contrast, although the hilar contours appear unchanged. Stable heterogeneous enlargement of both thyroid lobes, right greater than left. This has been previously evaluated by ultrasound. The trachea and esophagus demonstrate no significant findings. Lungs/Pleura: There are new multifocal airspace opacities within the right upper lobe consistent with pneumonia. No evidence of cavitation or dominant mass. There is no significant pleural effusion or pneumothorax. Upper abdomen: The visualized  upper abdomen appears stable without suspicious findings. There are probable small cysts in the upper poles of both kidneys. No adrenal mass. Musculoskeletal/Chest wall: There is no chest wall mass or suspicious osseous finding. Multilevel thoracic spondylosis with Schmorl's node formation in the superior endplate of J09. Advanced bilateral shoulder arthropathy. IMPRESSION: 1. New multifocal airspace opacities within the right upper lobe consistent with pneumonia. Radiographic follow-up recommended to document resolution. 2. No evidence of cavitation or dominant mass to suggest neoplasm. 3. Coronary andAortic Atherosclerosis (ICD10-I70.0). Electronically Signed   By: Richardean Sale M.D.   On: 01/04/2020 16:16   DG Chest Port 1 View  Result Date: 01/04/2020 CLINICAL DATA:  Weakness and tachycardia. EXAM: PORTABLE CHEST 1 VIEW COMPARISON:  CTA chest and chest x-ray dated March 12, 2019. FINDINGS: The heart size and mediastinal contours are within normal limits. Normal pulmonary vascularity. New 5.3 cm lesion in the right upper lobe with central lucency, suggestive of cavitation. No pleural effusion or pneumothorax. No acute osseous  abnormality. IMPRESSION: 1. Suspected new 5.3 cm cavitary lesion in the right upper lobe. CT of the chest with contrast is recommended for further evaluation. Electronically Signed   By: Titus Dubin M.D.   On: 01/04/2020 13:45     Discharge Exam: Vitals:   01/07/20 0551 01/07/20 0831  BP: 132/73   Pulse: 87   Resp: 17   Temp: 97.8 F (36.6 C)   SpO2: 98% 92%   Vitals:   01/06/20 1457 01/06/20 2154 01/07/20 0551 01/07/20 0831  BP: 111/75 (!) 124/56 132/73   Pulse: 96 (!) 102 87   Resp: 18 20 17    Temp: 98.6 F (37 C) 98.1 F (36.7 C) 97.8 F (36.6 C)   TempSrc: Oral     SpO2: 97% 97% 98% 92%  Weight:      Height:        General: Pt is alert, awake, not in acute distress Cardiovascular: RRR, S1/S2 +, no rubs, no gallops Respiratory: CTA bilaterally, no wheezing, no rhonchi Abdominal: Soft, NT, ND, bowel sounds + Extremities: no edema, no cyanosis    The results of significant diagnostics from this hospitalization (including imaging, microbiology, ancillary and laboratory) are listed below for reference.     Microbiology: Recent Results (from the past 240 hour(s))  SARS Coronavirus 2 by RT PCR (hospital order, performed in [facility] hospital lab) Nasopharyngeal Nasopharyngeal Swab     Status: None   Collection Time: 01/04/20  1:13 PM   Specimen: Nasopharyngeal Swab  Result Value Ref Range Status   SARS Coronavirus 2 NEGATIVE NEGATIVE Final    Comment: (NOTE) SARS-CoV-2 target nucleic acids are NOT DETECTED.  The SARS-CoV-2 RNA is generally detectable in upper and lower respiratory specimens during the acute phase of infection. The lowest concentration of SARS-CoV-2 viral copies this assay can detect is 250 copies / mL. A negative result does not preclude SARS-CoV-2 infection and should not be used as the sole basis for treatment or other patient management decisions.  A negative result may occur with improper specimen collection / handling, submission of  specimen other than nasopharyngeal swab, presence of viral mutation(s) within the areas targeted by this assay, and inadequate number of viral copies (<250 copies / mL). A negative result must be combined with clinical observations, patient history, and epidemiological information.  Fact Sheet for Patients:   StrictlyIdeas.no  Fact Sheet for Healthcare Providers: BankingDealers.co.za  This test is not yet approved or  cleared by the [place] FDA and  has been authorized for detection and/or diagnosis of SARS-CoV-2 by FDA under an Emergency Use Authorization (EUA).  This EUA will remain in effect (meaning this test can be used) for the duration of the COVID-19 declaration under Section 564(b)(1) of the Act, 21 U.S.C. section 360bbb-3(b)(1), unless the authorization is terminated or revoked sooner.  Performed at [facility], [address] [place], [place] [address]   Culture, blood (x 2)     Status: None (Preliminary result)   Collection Time: 01/04/20  7:07 PM   Specimen: Right Antecubital; Blood  Result Value Ref Range Status   Specimen Description RIGHT ANTECUBITAL  Final   Special Requests   Final    BOTTLES DRAWN AEROBIC AND ANAEROBIC Blood Culture adequate volume   Culture   Final    NO GROWTH 3 DAYS Performed at [facility], [address] [place], [place] [address]    Report Status PENDING  Incomplete  Culture, blood (x 2)     Status: None (Preliminary result)   Collection Time: 01/04/20  7:28 PM   Specimen: BLOOD RIGHT FOREARM  Result Value Ref Range Status   Specimen Description BLOOD RIGHT FOREARM  Final   Special Requests   Final    BOTTLES DRAWN AEROBIC AND ANAEROBIC Blood Culture adequate volume   Culture   Final    NO GROWTH 3 DAYS Performed at [facility], [address], [place], [place] [address]    Report Status PENDING  Incomplete     Labs: BNP (last 3 results) No results for input(s): BNP in the  last 8760 hours. Basic Metabolic Panel: Recent Labs  Lab 01/04/20 1414 01/05/20 0130 01/05/20 0622 01/06/20 0356 01/07/20 0608  NA 138 137 138 138 139  K 3.8 3.9 3.8 4.0 4.2  CL 99 102 104 100 104  CO2 25 25 25 25 25   GLUCOSE 107* 121* 100* 89 82  BUN 33* 31* 30* 26* 28*  CREATININE 1.72* 1.55* 1.54* 1.38* 1.44*  CALCIUM 10.1 9.2 9.1 9.6 9.0  MG  --   --   --   --  1.8   Liver Function Tests: Recent Labs  Lab 01/05/20 0130  AST 23  ALT 18  ALKPHOS 90  BILITOT 0.3  PROT 7.2  ALBUMIN 2.6*   No results for input(s): LIPASE, AMYLASE in the last 168 hours. No results for input(s): AMMONIA in the last 168 hours. CBC: Recent Labs  Lab 01/04/20 1414 01/05/20 0130 01/05/20 0622 01/06/20 0356 01/07/20 0608  WBC 20.3* 18.8* 16.8* 18.3* PENDING  NEUTROABS  --  15.3*  --   --   --   HGB 10.0* 8.9* 8.8* 10.1* 8.9*  HCT 31.6* 28.2* 28.2* 32.5* 29.0*  MCV 73.8* 75.2* 76.2* 75.1* 74.9*  PLT 924* 826* 770* 791* 776*   Cardiac Enzymes: No results for input(s): CKTOTAL, CKMB, CKMBINDEX, TROPONINI in the last 168 hours. BNP: Invalid input(s): POCBNP CBG: No results for input(s): GLUCAP in the last 168 hours. D-Dimer No results for input(s): DDIMER in the last 72 hours. Hgb A1c No results for input(s): HGBA1C in the last 72 hours. Lipid Profile Recent Labs    01/05/20 0629  CHOL 90  HDL 26*  LDLCALC 49  TRIG 73  CHOLHDL 3.5   Thyroid function studies Recent Labs    01/04/20 1414  TSH 1.265   Anemia work up No results for input(s): VITAMINB12, FOLATE, FERRITIN, TIBC, IRON, RETICCTPCT in the last 72 hours. Urinalysis    Component Value Date/Time  COLORURINE STRAW (A) 03/12/2019 1722   APPEARANCEUR CLEAR 03/12/2019 1722   LABSPEC 1.005 03/12/2019 1722   PHURINE 9.0 (H) 03/12/2019 1722   GLUCOSEU NEGATIVE 03/12/2019 1722   HGBUR NEGATIVE 03/12/2019 1722   BILIRUBINUR NEGATIVE 03/12/2019 1722   BILIRUBINUR neg 03/09/2015 1652   KETONESUR 5 (A) 03/12/2019  1722   PROTEINUR NEGATIVE 03/12/2019 1722   UROBILINOGEN negative 03/09/2015 1652   NITRITE NEGATIVE 03/12/2019 1722   LEUKOCYTESUR NEGATIVE 03/12/2019 1722   Sepsis Labs Invalid input(s): PROCALCITONIN,  WBC,  LACTICIDVEN Microbiology Recent Results (from the past 240 hour(s))  SARS Coronavirus 2 by RT PCR (hospital order, performed in [place] hospital lab) Nasopharyngeal Nasopharyngeal Swab     Status: None   Collection Time: 01/04/20  1:13 PM   Specimen: Nasopharyngeal Swab  Result Value Ref Range Status   SARS Coronavirus 2 NEGATIVE NEGATIVE Final    Comment: (NOTE) SARS-CoV-2 target nucleic acids are NOT DETECTED.  The SARS-CoV-2 RNA is generally detectable in upper and lower respiratory specimens during the acute phase of infection. The lowest concentration of SARS-CoV-2 viral copies this assay can detect is 250 copies / mL. A negative result does not preclude SARS-CoV-2 infection and should not be used as the sole basis for treatment or other patient management decisions.  A negative result may occur with improper specimen collection / handling, submission of specimen other than nasopharyngeal swab, presence of viral mutation(s) within the areas targeted by this assay, and inadequate number of viral copies (<250 copies / mL). A negative result must be combined with clinical observations, patient history, and epidemiological information.  Fact Sheet for Patients:   StrictlyIdeas.no  Fact Sheet for Healthcare Providers: BankingDealers.co.za  This test is not yet approved or  cleared by the [place] FDA and has been authorized for detection and/or diagnosis of SARS-CoV-2 by FDA under an Emergency Use Authorization (EUA).  This EUA will remain in effect (meaning this test can be used) for the duration of the COVID-19 declaration under Section 564(b)(1) of the Act, 21 U.S.C. section 360bbb-3(b)(1), unless the  authorization is terminated or revoked sooner.  Performed at [facility], [address] [place], [place] [address]   Culture, blood (x 2)     Status: None (Preliminary result)   Collection Time: 01/04/20  7:07 PM   Specimen: Right Antecubital; Blood  Result Value Ref Range Status   Specimen Description RIGHT ANTECUBITAL  Final   Special Requests   Final    BOTTLES DRAWN AEROBIC AND ANAEROBIC Blood Culture adequate volume   Culture   Final    NO GROWTH 3 DAYS Performed at [facility], [address] [place], [place] [address]    Report Status PENDING  Incomplete  Culture, blood (x 2)     Status: None (Preliminary result)   Collection Time: 01/04/20  7:28 PM   Specimen: BLOOD RIGHT FOREARM  Result Value Ref Range Status   Specimen Description BLOOD RIGHT FOREARM  Final   Special Requests   Final    BOTTLES DRAWN AEROBIC AND ANAEROBIC Blood Culture adequate volume   Culture   Final    NO GROWTH 3 DAYS Performed at [facility], [address], [place], [place] [address]    Report Status PENDING  Incomplete     Time coordinating discharge: 35 minutes  SIGNED:   Rodena Goldmann, DO [facility] 01/07/2020, 8:55 AM  If 7PM-7AM, please contact night-coverage [email or web address]

## 2020-01-07 NOTE — TOC Transition Note (Signed)
Transition of Care [facility]) - CM/SW Discharge Note   Patient Details  Name: [name] MRN: [id number] Date of Birth: 1923-11-24  Transition of Care [facility]) CM/SW Contact:  Natasha Bence, LCSW Phone Number: 01/07/2020, 10:08 AM   Clinical Narrative:    CSW informed [facility] of patient's discharge. [facility] agreeable to begin services. CSW notified patient on VM of followed up appointment scheduled with patient's PCP. TOC signing off.   Final next level of care: Home w Home Health Services Barriers to Discharge: Barriers Resolved   Patient Goals and CMS Choice Patient states their goals for this hospitalization and ongoing recovery are:: Return home with [facility]   Choice offered to / list presented to : Patient  Discharge Placement                    Patient and family notified of of transfer: 01/07/20  Discharge Plan and Services   Discharge Planning Services: NA Post Acute Care Choice: Home Health          DME Arranged: N/A DME Agency: NA       HH Arranged: PT, OT [place] Agency: [facility] [employer]) Date [place]: 01/07/20 Time [place]: [phone] Representative spoke with at [place]: [place] ([place]) Interventions     Readmission Risk Interventions Readmission Risk Prevention Plan 01/07/2020  Transportation Screening Complete  PCP or Specialist Appt within 5-7 Days Complete  Home Care Screening Complete  Medication Review (RN CM) Complete  Some recent data might be hidden

## 2020-01-08 ENCOUNTER — Telehealth: Payer: Self-pay | Admitting: *Deleted

## 2020-01-08 NOTE — Telephone Encounter (Signed)
TRANSITIONAL CARE MANAGEMENT TELEPHONE OUTREACH NOTE   Contact Date: 01/08/2020 Contacted By: Lynnea Ferrier, LPN   DISCHARGE INFORMATION Date of Discharge:01/07/20  Discharge Facility: [name] Discharge Diagnosis:Pneumonia   Outpatient Follow Up Recommendations (copied from discharge summary) 1. Follow up with PCP in 1-2 weeks 2. Please obtain BMP/CBC in one week 3. Continue on cefdinir as prescribed for 4 more days to complete 7-day course of treatment 4. Continue on metoprolol dosing of 75 mg twice daily for better heart rate control 5. Continue oral nystatin and topical polymyxin treatment for thrush and right eye conjunctivitis as prescribed 6. Continue on other home medications as prior  [name] is a female primary care patient of [name], [name]. An outgoing telephone call was made today and I spoke with her daughter [name].  Ms. Boutin condition(s) and treatment(s) were discussed. An opportunity to ask questions was provided and all were answered or forwarded as appropriate.    ACTIVITIES OF DAILY LIVING  [name] lives alone and she can perform ADLs independently. her primary caregiver is her daughter [name]. she is able to depend on her primary caregiver(s) for consistent help. Transportation to appointments, to pick up medications, and to run errands is not a problem.  (Consider referral to [facility] if transportation or a consistent caregiver is a problem)   Fall Risk Fall Risk  12/28/2019 08/10/2019  Falls in the past year? 1 0  Number falls in past yr: 0 -  Comment - -  Injury with Fall? 0 -  Comment - -  Risk for fall due to : Impaired balance/gait -  Follow up Falls evaluation completed -    high [place] Modifications/Assistive Devices Wheelchair: No Cane: Yes Ramp: No Bedside Toilet: Yes Hospital Bed:  No Other: Walker: yes   [name] she is not receiving home healthservices.     MEDICATION  RECONCILIATION  [name] has been able to pick-up all prescribed discharge medications from the pharmacy.   A post discharge medication reconciliation was performed and the complete medication list was reviewed with the patient/caregiver and is current as of 01/08/2020. Changes highlighted below.  Discontinued Medications None  Current Medication List cefdinir (OMNICEF) 300 MG capsule   metoprolol tartrate 75 MG TABS     Allergies as of 01/08/2020      Reactions   Advicor [niacin-lovastatin Er] Other (See Comments)   Reaction unknown to daughter   Caduet [amlodipine-atorvastatin] Other (See Comments)   Reaction unknown to daughter   Codeine Other (See Comments)   Reaction unknown to daughter   Dilantin [phenytoin] Other (See Comments)   Reaction unknown to daughter   Lescol [fluvastatin] Other (See Comments)   Reaction unknown to daughter   Pamelor [nortriptyline] Other (See Comments)   Reaction unknown to daughter   Penicillins Hives   Did it involve swelling of the face/tongue/throat, SOB, or low BP? Unk Did it involve sudden or severe rash/hives, skin peeling, or any reaction on the inside of your mouth or nose? Unk Did you need to seek medical attention at a hospital or doctor's office? Unk When did it last happen? "it was a long time ago" If all above answers are "NO", may proceed with cephalosporin use.   Sulfa Antibiotics Hives   Trileptal [oxcarbazepine] Other (See Comments)   Reaction unknown to daughter   Ultram [tramadol] Other (See Comments)   Reaction unknown to daughter   Welchol [colesevelam] Other (  See Comments)   Reaction unknown to daughter   Latex Rash   Tomato Rash      Medication List       Accurate as of January 08, 2020 10:40 AM. If you have any questions, ask your nurse or doctor.        acetaminophen 500 MG tablet Commonly known as: TYLENOL Take 500-1,000 mg by mouth every 8 (eight) hours as needed for mild pain or headache.     allopurinol 100 MG tablet Commonly known as: ZYLOPRIM TAKE 1/2 TABLET BY MOUTH EVERY DAY   Arnuity Ellipta 200 MCG/ACT Aepb Generic drug: Fluticasone Furoate INHALE 1 PUFF BY MOUTH EVERY DAY What changed:   how much to take  how to take this  when to take this  additional instructions   aspirin 81 MG tablet Take 81 mg by mouth daily.   cefdinir 300 MG capsule Commonly known as: OMNICEF Take 1 capsule (300 mg total) by mouth 2 (two) times daily for 4 days.   cetirizine 10 MG tablet Commonly known as: ZYRTEC TAKE 1 TABLET BY MOUTH EVERY DAY   divalproex 250 MG DR tablet Commonly known as: DEPAKOTE TAKE ONE TABLET BY MOUTH TWICE A DAY FOR 2 WEEKS, THEN TAKE 2 TABLETS TWICE A DAY THEREAFTER What changed:   how much to take  how to take this  when to take this  additional instructions   famotidine 40 MG tablet Commonly known as: PEPCID Take 1 tablet (40 mg total) by mouth daily. What changed:   when to take this  reasons to take this   ketoconazole 2 % cream Commonly known as: NIZORAL Apply to the affected areas underneath breasts, belly and in groin region ONCE daily x2-4 weeks. What changed:   how much to take  how to take this  when to take this  additional instructions   levETIRAcetam 500 MG 24 hr tablet Commonly known as: KEPPRA XR Take 4 tablets (2,000 mg total) by mouth at bedtime.   metolazone 2.5 MG tablet Commonly known as: ZAROXOLYN Take 1 Tablet by mouth on Mondays, Wednesdays and Fridays   Metoprolol Tartrate 75 MG Tabs Take 75 mg by mouth 2 (two) times daily.   mometasone 0.1 % ointment Commonly known as: ELOCON Apply to skin once daily as directed after bathing.   montelukast 10 MG tablet Commonly known as: SINGULAIR TAKE 1 TABLET BY MOUTH EVERYDAY AT BEDTIME What changed:   how much to take  how to take this  when to take this  additional instructions   nystatin 100000 UNIT/ML suspension Commonly known as:  MYCOSTATIN Take 5 mLs (500,000 Units total) by mouth 4 (four) times daily.   nystatin-triamcinolone ointment Commonly known as: MYCOLOG Apply 1 application topically 2 (two) times daily.   omeprazole 40 MG capsule Commonly known as: PriLOSEC Take 1 capsule (40 mg total) by mouth daily. What changed:   when to take this  reasons to take this   One-A-Day Womens 50 Plus Tabs Take 1 tablet by mouth daily with breakfast.   triamcinolone cream 0.1 % Commonly known as: KENALOG Apply 1 application topically 2 (two) times daily. What changed:   when to take this  reasons to take this   trimethoprim-polymyxin b ophthalmic solution Commonly known as: Polytrim Place 1 drop into the right eye every 6 (six) hours.        PATIENT EDUCATION & FOLLOW-UP PLAN  An appointment for Transitional Care Management is scheduled with On on 01/11/2020  at 11:30am.  Take all medications as prescribed  Contact our office by calling [phone] if you have any questions or concerns

## 2020-01-09 ENCOUNTER — Other Ambulatory Visit: Payer: Self-pay

## 2020-01-09 ENCOUNTER — Emergency Department (HOSPITAL_COMMUNITY): Payer: Medicare PPO

## 2020-01-09 ENCOUNTER — Inpatient Hospital Stay (HOSPITAL_COMMUNITY)
Admission: EM | Admit: 2020-01-09 | Discharge: 2020-01-12 | DRG: 308 | Disposition: A | Discharge status: Skilled Nursing Facility | Payer: Medicare PPO | Attending: Internal Medicine | Admitting: Internal Medicine

## 2020-01-09 ENCOUNTER — Encounter (HOSPITAL_COMMUNITY): Payer: Self-pay | Admitting: Emergency Medicine

## 2020-01-09 DIAGNOSIS — R531 Weakness: Secondary | ICD-10-CM | POA: Diagnosis not present

## 2020-01-09 DIAGNOSIS — Z8249 Family history of ischemic heart disease and other diseases of the circulatory system: Secondary | ICD-10-CM

## 2020-01-09 DIAGNOSIS — N1832 Chronic kidney disease, stage 3b: Secondary | ICD-10-CM | POA: Diagnosis not present

## 2020-01-09 DIAGNOSIS — I4892 Unspecified atrial flutter: Secondary | ICD-10-CM | POA: Diagnosis not present

## 2020-01-09 DIAGNOSIS — Z91040 Latex allergy status: Secondary | ICD-10-CM

## 2020-01-09 DIAGNOSIS — I248 Other forms of acute ischemic heart disease: Secondary | ICD-10-CM | POA: Diagnosis present

## 2020-01-09 DIAGNOSIS — D469 Myelodysplastic syndrome, unspecified: Secondary | ICD-10-CM | POA: Diagnosis present

## 2020-01-09 DIAGNOSIS — J45909 Unspecified asthma, uncomplicated: Secondary | ICD-10-CM | POA: Diagnosis present

## 2020-01-09 DIAGNOSIS — Z7982 Long term (current) use of aspirin: Secondary | ICD-10-CM | POA: Diagnosis not present

## 2020-01-09 DIAGNOSIS — Z9049 Acquired absence of other specified parts of digestive tract: Secondary | ICD-10-CM | POA: Diagnosis not present

## 2020-01-09 DIAGNOSIS — R000 Tachycardia, unspecified: Secondary | ICD-10-CM | POA: Diagnosis not present

## 2020-01-09 DIAGNOSIS — I4891 Unspecified atrial fibrillation: Secondary | ICD-10-CM

## 2020-01-09 DIAGNOSIS — R296 Repeated falls: Secondary | ICD-10-CM | POA: Diagnosis present

## 2020-01-09 DIAGNOSIS — R079 Chest pain, unspecified: Secondary | ICD-10-CM | POA: Diagnosis not present

## 2020-01-09 DIAGNOSIS — I4430 Unspecified atrioventricular block: Secondary | ICD-10-CM | POA: Diagnosis present

## 2020-01-09 DIAGNOSIS — I480 Paroxysmal atrial fibrillation: Secondary | ICD-10-CM | POA: Diagnosis present

## 2020-01-09 DIAGNOSIS — I959 Hypotension, unspecified: Secondary | ICD-10-CM | POA: Diagnosis not present

## 2020-01-09 DIAGNOSIS — F0390 Unspecified dementia without behavioral disturbance: Secondary | ICD-10-CM | POA: Diagnosis present

## 2020-01-09 DIAGNOSIS — Z90710 Acquired absence of both cervix and uterus: Secondary | ICD-10-CM | POA: Diagnosis not present

## 2020-01-09 DIAGNOSIS — Z833 Family history of diabetes mellitus: Secondary | ICD-10-CM | POA: Diagnosis not present

## 2020-01-09 DIAGNOSIS — Z6838 Body mass index (BMI) 38.0-38.9, adult: Secondary | ICD-10-CM | POA: Diagnosis not present

## 2020-01-09 DIAGNOSIS — Z20822 Contact with and (suspected) exposure to covid-19: Secondary | ICD-10-CM | POA: Diagnosis present

## 2020-01-09 DIAGNOSIS — D631 Anemia in chronic kidney disease: Secondary | ICD-10-CM | POA: Diagnosis present

## 2020-01-09 DIAGNOSIS — Z880 Allergy status to penicillin: Secondary | ICD-10-CM

## 2020-01-09 DIAGNOSIS — Z882 Allergy status to sulfonamides status: Secondary | ICD-10-CM

## 2020-01-09 DIAGNOSIS — Z888 Allergy status to other drugs, medicaments and biological substances status: Secondary | ICD-10-CM

## 2020-01-09 DIAGNOSIS — I10 Essential (primary) hypertension: Secondary | ICD-10-CM | POA: Diagnosis not present

## 2020-01-09 DIAGNOSIS — Z885 Allergy status to narcotic agent status: Secondary | ICD-10-CM

## 2020-01-09 DIAGNOSIS — Z8673 Personal history of transient ischemic attack (TIA), and cerebral infarction without residual deficits: Secondary | ICD-10-CM

## 2020-01-09 DIAGNOSIS — G40909 Epilepsy, unspecified, not intractable, without status epilepticus: Secondary | ICD-10-CM | POA: Diagnosis not present

## 2020-01-09 DIAGNOSIS — Z87898 Personal history of other specified conditions: Secondary | ICD-10-CM | POA: Diagnosis not present

## 2020-01-09 DIAGNOSIS — I129 Hypertensive chronic kidney disease with stage 1 through stage 4 chronic kidney disease, or unspecified chronic kidney disease: Secondary | ICD-10-CM | POA: Diagnosis present

## 2020-01-09 DIAGNOSIS — J189 Pneumonia, unspecified organism: Secondary | ICD-10-CM | POA: Diagnosis present

## 2020-01-09 DIAGNOSIS — K449 Diaphragmatic hernia without obstruction or gangrene: Secondary | ICD-10-CM | POA: Diagnosis not present

## 2020-01-09 DIAGNOSIS — N183 Chronic kidney disease, stage 3 unspecified: Secondary | ICD-10-CM | POA: Diagnosis present

## 2020-01-09 DIAGNOSIS — K219 Gastro-esophageal reflux disease without esophagitis: Secondary | ICD-10-CM | POA: Diagnosis present

## 2020-01-09 DIAGNOSIS — E785 Hyperlipidemia, unspecified: Secondary | ICD-10-CM | POA: Diagnosis present

## 2020-01-09 DIAGNOSIS — R4189 Other symptoms and signs involving cognitive functions and awareness: Secondary | ICD-10-CM | POA: Diagnosis present

## 2020-01-09 DIAGNOSIS — R410 Disorientation, unspecified: Secondary | ICD-10-CM | POA: Diagnosis not present

## 2020-01-09 DIAGNOSIS — M48062 Spinal stenosis, lumbar region with neurogenic claudication: Secondary | ICD-10-CM | POA: Diagnosis not present

## 2020-01-09 DIAGNOSIS — Z79899 Other long term (current) drug therapy: Secondary | ICD-10-CM | POA: Diagnosis not present

## 2020-01-09 DIAGNOSIS — Z91018 Allergy to other foods: Secondary | ICD-10-CM

## 2020-01-09 LAB — CULTURE, BLOOD (ROUTINE X 2)
Culture: NO GROWTH
Culture: NO GROWTH
Special Requests: ADEQUATE
Special Requests: ADEQUATE

## 2020-01-09 LAB — CBC
HCT: 31.3 % — ABNORMAL LOW (ref 36.0–46.0)
Hemoglobin: 10.3 g/dL — ABNORMAL LOW (ref 12.0–15.0)
MCH: 23.6 pg — ABNORMAL LOW (ref 26.0–34.0)
MCHC: 32.9 g/dL (ref 30.0–36.0)
MCV: 71.8 fL — ABNORMAL LOW (ref 80.0–100.0)
Platelets: 973 10*3/uL (ref 150–400)
RBC: 4.36 MIL/uL (ref 3.87–5.11)
RDW: 19.6 % — ABNORMAL HIGH (ref 11.5–15.5)
WBC: 17.3 10*3/uL — ABNORMAL HIGH (ref 4.0–10.5)
nRBC: 1.9 % — ABNORMAL HIGH (ref 0.0–0.2)

## 2020-01-09 LAB — BASIC METABOLIC PANEL
Anion gap: 14 (ref 5–15)
BUN: 26 mg/dL — ABNORMAL HIGH (ref 8–23)
CO2: 23 mmol/L (ref 22–32)
Calcium: 10.3 mg/dL (ref 8.9–10.3)
Chloride: 102 mmol/L (ref 98–111)
Creatinine, Ser: 1.36 mg/dL — ABNORMAL HIGH (ref 0.44–1.00)
GFR calc Af Amer: 39 mL/min — ABNORMAL LOW (ref >60)
GFR calc non Af Amer: 33 mL/min — ABNORMAL LOW (ref >60)
Glucose, Bld: 104 mg/dL — ABNORMAL HIGH (ref 70–99)
Potassium: 3.6 mmol/L (ref 3.5–5.1)
Sodium: 139 mmol/L (ref 135–145)

## 2020-01-09 LAB — VALPROIC ACID LEVEL: Valproic Acid Lvl: <10 ug/mL — ABNORMAL LOW (ref 50.0–100.0)

## 2020-01-09 LAB — CBG MONITORING, ED: Glucose-Capillary: 96 mg/dL (ref 70–99)

## 2020-01-09 LAB — TROPONIN I (HIGH SENSITIVITY): Troponin I (High Sensitivity): 21 ng/L — ABNORMAL HIGH (ref <18)

## 2020-01-09 MED ORDER — DILTIAZEM HCL-DEXTROSE 125-5 MG/125ML-% IV SOLN (PREMIX)
5.0000 mg/h | INTRAVENOUS | Status: DC
  Administered 2020-01-09: 5 mg/h via INTRAVENOUS
  Administered 2020-01-10: 10 mg/h via INTRAVENOUS
  Filled 2020-01-09: qty 125

## 2020-01-09 NOTE — ED Triage Notes (Signed)
[facility] - pt c/o chest tightness and "needing help." pt recently d/c'd from here. Pt altered

## 2020-01-09 NOTE — ED Notes (Addendum)
Date and time results received: 01/09/20 2137   Test: Platelets Critical Value: 973  Name of Provider Notified: Alvino Chapel, MD

## 2020-01-09 NOTE — ED Provider Notes (Signed)
[facility] EMERGENCY DEPARTMENT Provider Note   CSN: [id number] Arrival date & time: 01/09/20  2049     History Chief Complaint  Patient presents with  . Chest Pain    [name] is a 86 y.o. female.  HPI Level 5 caveat due to dementia/mental status change. Patient recently discharged from hospital.  Patient unsure why she is here now but states she was told she had to come in the hospital.  States she may have had some chest pain.  States she said she knew she needed some help.  Recent admission the hospital with pneumonia and SVT.    Past Medical History:  Diagnosis Date  . Chronic low back pain   . Degenerative arthritis   . Eczema   . Edema   . Gout   . Headache   . Hiatal hernia   . Hypertension   . Internal hemorrhoids   . Neurogenic claudication   . Obesity   . Seizure disorder ([place])   . Seizures ([place])   . Stroke ([place])   . Urticaria     Patient Active Problem List   Diagnosis Date Noted  . A-fib ([place]) 01/09/2020  . Pneumonia 01/04/2020  . Class 2 obesity 01/04/2020  . CKD (chronic kidney disease) stage 3, GFR 30-59 ml/min 01/04/2020  . Nausea vomiting and diarrhea 03/12/2019  . Lung nodules 03/12/2019  . Atypical chest pain 12/31/2018  . Asthma 02/12/2017  . Chest pain 11/13/2016  . History of seizures 11/13/2016  . History of stroke 11/13/2016  . Allergic urticaria 08/30/2016  . GAD (generalized anxiety disorder) 08/30/2016  . Gout 08/30/2016  . Acute renal failure superimposed on stage 3 chronic kidney disease ([place]) 10/11/2015  . Neurogenic claudication   . Morbid obesity ([place])   . Edema   . Essential hypertension, benign 09/30/2014  . Secondary hyperparathyroidism, renal ([place]) 09/30/2014  . Proteinuria 09/30/2014  . Anemia associated with chronic renal failure 09/30/2014  . Chronic kidney disease (CKD), stage IV (severe) ([place]) 09/30/2014  . Hiatal hernia 08/21/2012  . Seizure disorder ([place]) 08/21/2012    Past Surgical History:    Procedure Laterality Date  . ABDOMINAL HYSTERECTOMY    . APPENDECTOMY    . APPENDECTOMY    . bilateral knees    . cataracts    . CHOLECYSTECTOMY    . mammoplasty reduction    . NEUROPLASTY / TRANSPOSITION MEDIAN NERVE AT CARPAL TUNNEL BILATERAL    . RE-EXCISION OF BREAST LUMPECTOMY    . TUBAL LIGATION       OB History    Gravida  6   Para  4   Term  4   Preterm      AB  2   Living        SAB  2   TAB      Ectopic      Multiple      Live Births              Family History  Problem Relation Age of Onset  . Diabetes Mother   . Heart disease Mother   . Heart disease Father   . Cancer Sister   . Cancer Brother   . Cancer Sister     Social History   Tobacco Use  . Smoking status: Never Smoker  . Smokeless tobacco: Never Used  Vaping Use  . Vaping Use: Never used  Substance Use Topics  . Alcohol use: No  . Drug use: No  Home Medications Prior to Admission medications   Medication Sig Start Date End Date Taking? Authorizing Provider  acetaminophen (TYLENOL) 500 MG tablet Take 500-1,000 mg by mouth every 8 (eight) hours as needed for mild pain or headache.     [provider]  allopurinol (ZYLOPRIM) 100 MG tablet TAKE 1/2 TABLET BY MOUTH EVERY DAY Patient taking differently: Take 50 mg by mouth daily.  09/24/19   [name] A, [name]  aspirin 81 MG tablet Take 81 mg by mouth daily.    [provider]  cefdinir (OMNICEF) 300 MG capsule Take 1 capsule (300 mg total) by mouth 2 (two) times daily for 4 days. 01/07/20 01/11/20  Manuella Ghazi, Pratik D, DO  cetirizine (ZYRTEC) 10 MG tablet TAKE 1 TABLET BY MOUTH EVERY DAY Patient taking differently: Take 10 mg by mouth daily.  09/25/19   [name] A, [name]  divalproex (DEPAKOTE) 250 MG DR tablet TAKE ONE TABLET BY MOUTH TWICE A DAY FOR 2 WEEKS, THEN TAKE 2 TABLETS TWICE A DAY THEREAFTER Patient taking differently: Take 500 mg by mouth 2 (two) times daily.  10/20/19   Kathrynn Ducking, MD   famotidine (PEPCID) 40 MG tablet Take 1 tablet (40 mg total) by mouth daily. Patient taking differently: Take 40 mg by mouth daily as needed for heartburn.  04/08/19   Kozlow, Donnamarie Poag, MD  Fluticasone Furoate (ARNUITY ELLIPTA) 200 MCG/ACT AEPB INHALE 1 PUFF BY MOUTH EVERY DAY Patient taking differently: Take 1 puff by mouth daily.  12/03/19   Kozlow, Donnamarie Poag, MD  ketoconazole (NIZORAL) 2 % cream Apply to the affected areas underneath breasts, belly and in groin region ONCE daily x2-4 weeks. Patient taking differently: Apply 1 application topically See admin instructions. Apply to the affected areas underneath breasts, belly and in groin region ONCE daily for 2-4 weeks 09/16/18   Ronnie Doss M, DO  levETIRAcetam (KEPPRA XR) 500 MG 24 hr tablet Take 4 tablets (2,000 mg total) by mouth at bedtime. 11/10/18   Suzzanne Cloud, NP  metolazone (ZAROXOLYN) 2.5 MG tablet Take 1 Tablet by mouth on Mondays, Wednesdays and Fridays 08/17/19   [name] A, [name]  metoprolol tartrate 75 MG TABS Take 75 mg by mouth 2 (two) times daily. 01/07/20 02/06/20  Manuella Ghazi, Pratik D, DO  mometasone (ELOCON) 0.1 % ointment Apply to skin once daily as directed after bathing. 03/04/18   Kozlow, Donnamarie Poag, MD  montelukast (SINGULAIR) 10 MG tablet TAKE 1 TABLET BY MOUTH EVERYDAY AT BEDTIME Patient taking differently: Take 10 mg by mouth at bedtime.  12/03/19   Kozlow, Donnamarie Poag, MD  Multiple Vitamins-Minerals (ONE-A-DAY WOMENS 50 PLUS) TABS Take 1 tablet by mouth daily with breakfast.    [provider]  nystatin (MYCOSTATIN) 100000 UNIT/ML suspension Take 5 mLs (500,000 Units total) by mouth 4 (four) times daily. 12/28/19   Dettinger, Fransisca Kaufmann, MD  nystatin-triamcinolone ointment [facility]) Apply 1 application topically 2 (two) times daily. 11/16/15   [name], [name]  omeprazole (PRILOSEC) 40 MG capsule Take 1 capsule (40 mg total) by mouth daily. Patient taking differently: Take 40 mg by mouth daily as needed (for reflux).   07/03/18   Kozlow, Donnamarie Poag, MD  triamcinolone cream (KENALOG) 0.1 % Apply 1 application topically 2 (two) times daily. Patient taking differently: Apply 1 application topically 2 (two) times daily as needed (for eczema).  03/18/18   Kozlow, Donnamarie Poag, MD  trimethoprim-polymyxin b (POLYTRIM) ophthalmic solution Place 1 drop into the right eye  every 6 (six) hours. 12/18/18   [name], [name]    Allergies    Advicor [niacin-lovastatin er], Caduet [amlodipine-atorvastatin], Codeine, Dilantin [phenytoin], Lescol [fluvastatin], Pamelor [nortriptyline], Penicillins, Sulfa antibiotics, Trileptal [oxcarbazepine], Ultram [tramadol], Welchol [colesevelam], Latex, and Tomato  Review of Systems   Review of Systems  Unable to perform ROS: Dementia    Physical Exam Updated Vital Signs BP (!) 162/113   Pulse (!) 103   Temp 98 F (36.7 C) (Oral)   Resp (!) 27   Ht 5\' 3"  (1.6 m)   Wt 97.4 kg   SpO2 96%   BMI 38.04 kg/m   Physical Exam Vitals reviewed.  Constitutional:      Appearance: She is well-developed.  HENT:     Head: Normocephalic.  Cardiovascular:     Rate and Rhythm: Tachycardia present. Rhythm irregular.  Pulmonary:     Breath sounds: No wheezing or rhonchi.  Musculoskeletal:     Right lower leg: No tenderness.     Left lower leg: No tenderness.  Skin:    General: Skin is warm.     Capillary Refill: Capillary refill takes less than 2 seconds.  Neurological:     Mental Status: She is alert.     Comments: Awake and pleasant but some confusion.     ED Results / Procedures / Treatments   Labs (all labs ordered are listed, but only abnormal results are displayed) Labs Reviewed  BASIC METABOLIC PANEL - Abnormal; Notable for the following components:      Result Value   Glucose, Bld 104 (*)    BUN 26 (*)    Creatinine, Ser 1.36 (*)    GFR calc non Af Amer 33 (*)    GFR calc Af Amer 39 (*)    All other components within normal limits  CBC - Abnormal; Notable for the  following components:   WBC 17.3 (*)    Hemoglobin 10.3 (*)    HCT 31.3 (*)    MCV 71.8 (*)    MCH 23.6 (*)    RDW 19.6 (*)    Platelets 973 (*)    nRBC 1.9 (*)    All other components within normal limits  TROPONIN I (HIGH SENSITIVITY) - Abnormal; Notable for the following components:   Troponin I (High Sensitivity) 21 (*)    All other components within normal limits  VALPROIC ACID LEVEL  CBG MONITORING, ED  TROPONIN I (HIGH SENSITIVITY)    EKG EKG Interpretation  Date/Time:  Saturday January 09 2020 20:54:28 EDT Ventricular Rate:  129 PR Interval:    QRS Duration: 104 QT Interval:  318 QTC Calculation: 464 R Axis:   -27 Text Interpretation: Atrial flutter with varied AV block, Borderline left axis deviation Anteroseptal infarct, age indeterminate Minimal ST elevation, inferior leads Confirmed by Davonna Belling [id number]) on 01/09/2020 9:18:22 PM   Radiology DG Chest 2 View  Result Date: 01/09/2020 CLINICAL DATA:  Chest pain EXAM: CHEST - 2 VIEW COMPARISON:  01/04/2020 FINDINGS: There are persistent but improved airspace opacities in the right upper lobe. The heart size is stable. There is no pneumothorax. Again noted are findings of thyromegaly. There is no acute osseous abnormality. There are degenerative changes of both glenohumeral joints. IMPRESSION: Persistent but improved airspace opacities in the right upper lobe. Electronically Signed   By: Constance Holster M.D.   On: 01/09/2020 21:54    Procedures Procedures (including critical care time)  Medications Ordered in ED Medications  diltiazem (CARDIZEM) 125 mg  in dextrose 5% 125 mL (1 mg/mL) infusion (10 mg/hr Intravenous Rate/Dose Change 01/09/20 2250)    ED Course  I have reviewed the triage vital signs and the nursing notes.  Pertinent labs & imaging results that were available during my care of the patient were reviewed by me and considered in my medical decision making (see chart for details).    MDM  Rules/Calculators/A&P                          Patient presented with fast heart rate.  Recent mission in the hospital had SVT but this appears to be A. fib or flutter with RVR.  Started with Cardizem drip.  Do not see history of this.  CHA2DS2-VASc score of at least 6.  X-ray showed improving pneumonia.  Lab work showed stable creatinine.  Troponin mildly elevated.  With IV medications needed for rate control will admit to hospitalist   CRITICAL CARE Performed by: Davonna Belling Total critical care time: 30 minutes Critical care time was exclusive of separately billable procedures and treating other patients. Critical care was necessary to treat or prevent imminent or life-threatening deterioration. Critical care was time spent personally by me on the following activities: development of treatment plan with patient and/or surrogate as well as nursing, discussions with consultants, evaluation of patient's response to treatment, examination of patient, obtaining history from patient or surrogate, ordering and performing treatments and interventions, ordering and review of laboratory studies, ordering and review of radiographic studies, pulse oximetry and re-evaluation of patient's condition.  Final Clinical Impression(s) / ED Diagnoses Final diagnoses:  Atrial fibrillation with rapid ventricular response [facility])    Rx / DC Orders ED Discharge Orders    None       Davonna Belling, MD 01/09/20 2254

## 2020-01-10 ENCOUNTER — Inpatient Hospital Stay (HOSPITAL_COMMUNITY): Payer: Medicare PPO

## 2020-01-10 DIAGNOSIS — G40909 Epilepsy, unspecified, not intractable, without status epilepticus: Secondary | ICD-10-CM

## 2020-01-10 DIAGNOSIS — I4891 Unspecified atrial fibrillation: Secondary | ICD-10-CM

## 2020-01-10 DIAGNOSIS — N1832 Chronic kidney disease, stage 3b: Secondary | ICD-10-CM

## 2020-01-10 LAB — COMPREHENSIVE METABOLIC PANEL
ALT: 18 U/L (ref 0–44)
AST: 26 U/L (ref 15–41)
Albumin: 2.7 g/dL — ABNORMAL LOW (ref 3.5–5.0)
Alkaline Phosphatase: 70 U/L (ref 38–126)
Anion gap: 12 (ref 5–15)
BUN: 24 mg/dL — ABNORMAL HIGH (ref 8–23)
CO2: 23 mmol/L (ref 22–32)
Calcium: 9.8 mg/dL (ref 8.9–10.3)
Chloride: 103 mmol/L (ref 98–111)
Creatinine, Ser: 1.25 mg/dL — ABNORMAL HIGH (ref 0.44–1.00)
GFR calc Af Amer: 43 mL/min — ABNORMAL LOW (ref >60)
GFR calc non Af Amer: 37 mL/min — ABNORMAL LOW (ref >60)
Glucose, Bld: 109 mg/dL — ABNORMAL HIGH (ref 70–99)
Potassium: 3.8 mmol/L (ref 3.5–5.1)
Sodium: 138 mmol/L (ref 135–145)
Total Bilirubin: 0.5 mg/dL (ref 0.3–1.2)
Total Protein: 6.8 g/dL (ref 6.5–8.1)

## 2020-01-10 LAB — CBC
HCT: 29.9 % — ABNORMAL LOW (ref 36.0–46.0)
Hemoglobin: 9.5 g/dL — ABNORMAL LOW (ref 12.0–15.0)
MCH: 23.2 pg — ABNORMAL LOW (ref 26.0–34.0)
MCHC: 31.8 g/dL (ref 30.0–36.0)
MCV: 73.1 fL — ABNORMAL LOW (ref 80.0–100.0)
Platelets: 856 10*3/uL — ABNORMAL HIGH (ref 150–400)
RBC: 4.09 MIL/uL (ref 3.87–5.11)
RDW: 18.3 % — ABNORMAL HIGH (ref 11.5–15.5)
WBC: 15.9 10*3/uL — ABNORMAL HIGH (ref 4.0–10.5)
nRBC: 2.1 % — ABNORMAL HIGH (ref 0.0–0.2)

## 2020-01-10 LAB — RESPIRATORY PANEL BY RT PCR (FLU A&B, COVID)
Influenza A by PCR: NEGATIVE
Influenza B by PCR: NEGATIVE
SARS Coronavirus 2 by RT PCR: NEGATIVE

## 2020-01-10 LAB — IRON AND TIBC
Iron: 53 ug/dL (ref 28–170)
Saturation Ratios: 27 % (ref 10.4–31.8)
TIBC: 200 ug/dL — ABNORMAL LOW (ref 250–450)
UIBC: 147 ug/dL

## 2020-01-10 LAB — URINALYSIS, ROUTINE W REFLEX MICROSCOPIC
Bilirubin Urine: NEGATIVE
Glucose, UA: NEGATIVE mg/dL
Hgb urine dipstick: NEGATIVE
Ketones, ur: 5 mg/dL — AB
Nitrite: NEGATIVE
Protein, ur: NEGATIVE mg/dL
Specific Gravity, Urine: 1.011 (ref 1.005–1.030)
pH: 6 (ref 5.0–8.0)

## 2020-01-10 LAB — ECHOCARDIOGRAM COMPLETE
Area-P 1/2: 4.54 cm2
Height: 63 in
S' Lateral: 2.92 cm
Weight: 3435.65 oz

## 2020-01-10 LAB — RAPID URINE DRUG SCREEN, HOSP PERFORMED
Amphetamines: NOT DETECTED
Barbiturates: NOT DETECTED
Benzodiazepines: NOT DETECTED
Cocaine: NOT DETECTED
Opiates: NOT DETECTED
Tetrahydrocannabinol: NOT DETECTED

## 2020-01-10 LAB — FOLATE: Folate: 37.9 ng/mL (ref >5.9)

## 2020-01-10 LAB — VITAMIN B12: Vitamin B-12: 5403 pg/mL — ABNORMAL HIGH (ref 180–914)

## 2020-01-10 LAB — TROPONIN I (HIGH SENSITIVITY): Troponin I (High Sensitivity): 25 ng/L — ABNORMAL HIGH (ref <18)

## 2020-01-10 LAB — MAGNESIUM: Magnesium: 1.5 mg/dL — ABNORMAL LOW (ref 1.7–2.4)

## 2020-01-10 LAB — ETHANOL: Alcohol, Ethyl (B): <10 mg/dL (ref <10)

## 2020-01-10 LAB — PROCALCITONIN: Procalcitonin: 0.19 ng/mL

## 2020-01-10 LAB — FERRITIN: Ferritin: 336 ng/mL — ABNORMAL HIGH (ref 11–307)

## 2020-01-10 LAB — TSH: TSH: 1.459 u[IU]/mL (ref 0.350–4.500)

## 2020-01-10 MED ORDER — APIXABAN 5 MG PO TABS
5.0000 mg | ORAL_TABLET | Freq: Two times a day (BID) | ORAL | Status: DC
  Administered 2020-01-10 – 2020-01-11 (×3): 5 mg via ORAL
  Filled 2020-01-10 (×3): qty 1

## 2020-01-10 MED ORDER — CEFDINIR 300 MG PO CAPS
300.0000 mg | ORAL_CAPSULE | Freq: Two times a day (BID) | ORAL | Status: DC
  Administered 2020-01-10 – 2020-01-11 (×3): 300 mg via ORAL
  Filled 2020-01-10 (×3): qty 1

## 2020-01-10 MED ORDER — MONTELUKAST SODIUM 10 MG PO TABS
10.0000 mg | ORAL_TABLET | Freq: Every day | ORAL | Status: DC
  Administered 2020-01-10: 10 mg via ORAL
  Filled 2020-01-10 (×2): qty 1

## 2020-01-10 MED ORDER — BUDESONIDE 0.25 MG/2ML IN SUSP
2.0000 mL | Freq: Two times a day (BID) | RESPIRATORY_TRACT | Status: DC
  Administered 2020-01-10 – 2020-01-12 (×5): 0.25 mg via RESPIRATORY_TRACT
  Filled 2020-01-10 (×5): qty 2

## 2020-01-10 MED ORDER — HEPARIN BOLUS VIA INFUSION
3000.0000 [IU] | Freq: Once | INTRAVENOUS | Status: AC
  Administered 2020-01-10: 3000 [IU] via INTRAVENOUS

## 2020-01-10 MED ORDER — ADULT MULTIVITAMIN W/MINERALS CH
1.0000 | ORAL_TABLET | Freq: Every day | ORAL | Status: DC
  Administered 2020-01-10 – 2020-01-12 (×3): 1 via ORAL
  Filled 2020-01-10 (×3): qty 1

## 2020-01-10 MED ORDER — METOLAZONE 2.5 MG PO TABS
2.5000 mg | ORAL_TABLET | Freq: Every day | ORAL | Status: DC
  Filled 2020-01-10 (×2): qty 1

## 2020-01-10 MED ORDER — ACETAMINOPHEN 325 MG PO TABS
650.0000 mg | ORAL_TABLET | ORAL | Status: DC | PRN
  Filled 2020-01-10: qty 2

## 2020-01-10 MED ORDER — METOPROLOL TARTRATE 50 MG PO TABS: 75.0000 mg | ORAL_TABLET | Freq: Two times a day (BID) | ORAL | Status: DC

## 2020-01-10 MED ORDER — DIVALPROEX SODIUM 250 MG PO DR TAB
500.0000 mg | DELAYED_RELEASE_TABLET | Freq: Two times a day (BID) | ORAL | Status: DC
  Administered 2020-01-10 – 2020-01-12 (×4): 500 mg via ORAL
  Filled 2020-01-10 (×5): qty 2

## 2020-01-10 MED ORDER — DILTIAZEM HCL 30 MG PO TABS
30.0000 mg | ORAL_TABLET | Freq: Four times a day (QID) | ORAL | Status: DC
  Administered 2020-01-10 – 2020-01-11 (×5): 30 mg via ORAL
  Filled 2020-01-10 (×5): qty 1

## 2020-01-10 MED ORDER — ALPRAZOLAM 0.25 MG PO TABS: 0.2500 mg | ORAL_TABLET | Freq: Two times a day (BID) | ORAL | Status: DC | PRN

## 2020-01-10 MED ORDER — FAMOTIDINE 20 MG PO TABS: 40.0000 mg | ORAL_TABLET | Freq: Every day | ORAL | Status: DC | PRN

## 2020-01-10 MED ORDER — ASPIRIN EC 81 MG PO TBEC
81.0000 mg | DELAYED_RELEASE_TABLET | Freq: Every day | ORAL | Status: DC
  Administered 2020-01-10: 81 mg via ORAL
  Filled 2020-01-10: qty 1

## 2020-01-10 MED ORDER — METOPROLOL TARTRATE 50 MG PO TABS
25.0000 mg | ORAL_TABLET | Freq: Two times a day (BID) | ORAL | Status: DC
  Administered 2020-01-10 – 2020-01-12 (×4): 25 mg via ORAL
  Filled 2020-01-10 (×5): qty 1

## 2020-01-10 MED ORDER — HEPARIN (PORCINE) 25000 UT/250ML-% IV SOLN
850.0000 [IU]/h | INTRAVENOUS | Status: DC
  Administered 2020-01-10: 850 [IU]/h via INTRAVENOUS
  Filled 2020-01-10: qty 250

## 2020-01-10 MED ORDER — LEVETIRACETAM ER 500 MG PO TB24
2000.0000 mg | ORAL_TABLET | Freq: Every day | ORAL | Status: DC
  Administered 2020-01-10: 2000 mg via ORAL
  Filled 2020-01-10 (×4): qty 4

## 2020-01-10 MED ORDER — METOLAZONE 5 MG PO TABS
2.5000 mg | ORAL_TABLET | ORAL | Status: DC
  Administered 2020-01-11: 2.5 mg via ORAL
  Filled 2020-01-10 (×2): qty 1

## 2020-01-10 MED ORDER — ONDANSETRON HCL 4 MG/2ML IJ SOLN: 4.0000 mg | Freq: Four times a day (QID) | INTRAMUSCULAR | Status: DC | PRN

## 2020-01-10 MED ORDER — PANTOPRAZOLE SODIUM 40 MG PO TBEC
40.0000 mg | DELAYED_RELEASE_TABLET | Freq: Every day | ORAL | Status: DC
  Administered 2020-01-10 – 2020-01-12 (×3): 40 mg via ORAL
  Filled 2020-01-10 (×3): qty 1

## 2020-01-10 MED ORDER — MAGNESIUM SULFATE 2 GM/50ML IV SOLN
2.0000 g | Freq: Once | INTRAVENOUS | Status: AC
  Administered 2020-01-10: 2 g via INTRAVENOUS
  Filled 2020-01-10: qty 50

## 2020-01-10 NOTE — Progress Notes (Signed)
PROGRESS NOTE  [name]:[id number] DOB: Oct 17, 1923 DOA: 01/09/2020 PCP: [name], [name]  Brief History:  86 year old female with a history of hypertension, hyperlipidemia, stroke, seizure disorder, cognitive impairment presenting with chest pain and shortness of breath.  Unfortunately, the patient is a poor historian secondary to her cognitive impairment.  History is obtained from review of the medical record and speaking with the patient's daughter.  Notably, the patient was recently discharged from the hospital after a stay from 01/04/2020 to 01/07/2020 during which she was treated for sepsis secondary to pneumonia.  She was discharged home with cefdinir for 4 additional days.  During the hospitalization, she had an episode of SVT for which her metoprolol tartrate was increased to 75 mg twice daily.  She was discharged home with home health physical therapy.  Apparently, the patient's daughter stated that the patient was "normal" on 01/08/2020.  However, on 01/09/2020, the patient called her daughter stating that she was short of breath and coughing.  Therefore, the patient's neighbor was asked to come by to check up on her.  Apparently the patient has been having loose stools since arrival back to the home.  The patient herself denies any abdominal pain nausea, vomiting.  History has been difficult secondary to the patient's tangential speech.  Apparently EMS was activated at which time the patient had been complaining of some chest pain.  She was noted to be tachycardic. In the emergency department, the patient was afebrile hemodynamically stable with oxygen saturation 94-95% room air.  She was noted to have atrial fibrillation with RVR.  She was started on diltiazem drip and IV heparin.  Chest x-ray showed improving right upper lobe opacity.  BMP was essentially unremarkable with her serum creatinine at baseline.  LFTs were unremarkable.  WBC 17.3, hemoglobin 10.3, platelets  173,000.  Troponin 21>>25.  Assessment/Plan: Atrial fibrillation with RVR, type unspecified -spontaneously converted to sinus AM 9/26 -transition to po diltiazem -CHADS-VASc = 7 -Discontinue IV heparin -Start apixaban -Echocardiogram -Continue metoprolol at lower dose--doubt she has been taking properly at home -TSH 1.459  Elevated troponin -Secondary to demand ischemia -Personally reviewed EKG--atrial fibrillation, nonspecific ST changes -Personally reviewed chest x-ray--including right upper lobe opacity; no edema -Echocardiogram  Leukocytosis/Thrombocytosis -Suspect the patient may have myelodysplastic syndrome given her concomitant significant elevation in platelets -Certainly, the patient may have an infectious process versus stress demargination -Blood cultures x2 sets -UA negative for pyuria -Finish course of cefdinir -Stool for C. difficile given history of loose stools at home -check iron -may need outpatient hematology follow up  CKD stage IIIb -Baseline creatinine 1.3-1.5 -A.m. BMP  Seizure disorder -Doubt patient has been taking her Depakote or Keppra properly -This was confirmed after speaking with the patient's daughter -VPA level undetectable -Restart Keppra and VPA  Asthma -continue pulmicort -continue singulair  Hypomagnesemia -replete  Morbid Obesity -BMI--38.04 -Lifestyle modification  GERD    Status is: Inpatient  Remains inpatient appropriate because:IV treatments appropriate due to intensity of illness or inability to take PO   Dispo: The patient is from: Home              Anticipated d/c is to: Home              Anticipated d/c date is: 1 day              Patient currently is not medically stable to d/c.  Family Communication:   Updated daughter 9/26  Consultants:  none  Code Status:  FULL  DVT Prophylaxis:  apixaban   Procedures: As Listed in Progress Note  Above  Antibiotics: cefdinir     Subjective: Patient denies fevers, chills, headache, chest pain, dyspnea, nausea, vomiting, diarrhea, abdominal pain, dysuria,    Objective: Vitals:   01/10/20 0500 01/10/20 0600 01/10/20 0630 01/10/20 0730  BP: (!) 142/53 130/67 (!) 129/56 129/71  Pulse: 86 85 98 86  Resp: 17 14 14 16   Temp:      TempSrc:      SpO2: 94% 94% 96% 96%  Weight:      Height:       No intake or output data in the 24 hours ending 01/10/20 0804 Weight change:  Exam:   General:  Pt is alert, follows commands appropriately, not in acute distress  HEENT: No icterus, No thrush, No neck mass, [place]/AT  Cardiovascular: RRR, S1/S2, no rubs, no gallops  Respiratory: bibasilar rales. No wheeze  Abdomen: Soft/+BS, non tender, non distended, no guarding  Extremities: No edema, No lymphangitis, No petechiae, No rashes, no synovitis   Data Reviewed: I have personally reviewed following labs and imaging studies Basic Metabolic Panel: Recent Labs  Lab 01/05/20 0622 01/06/20 0356 01/07/20 0608 01/09/20 2104 01/10/20 0608  NA 138 138 139 139 138  K 3.8 4.0 4.2 3.6 3.8  CL 104 100 104 102 103  CO2 25 25 25 23 23   GLUCOSE 100* 89 82 104* 109*  BUN 30* 26* 28* 26* 24*  CREATININE 1.54* 1.38* 1.44* 1.36* 1.25*  CALCIUM 9.1 9.6 9.0 10.3 9.8  MG  --   --  1.8  --  1.5*   Liver Function Tests: Recent Labs  Lab 01/05/20 0130 01/10/20 0608  AST 23 26  ALT 18 18  ALKPHOS 90 70  BILITOT 0.3 0.5  PROT 7.2 6.8  ALBUMIN 2.6* 2.7*   No results for input(s): LIPASE, AMYLASE in the last 168 hours. No results for input(s): AMMONIA in the last 168 hours. Coagulation Profile: Recent Labs  Lab 01/05/20 0130  INR 1.3*   CBC: Recent Labs  Lab 01/05/20 0130 01/05/20 0130 01/05/20 0622 01/06/20 0356 01/07/20 0608 01/09/20 2104 01/10/20 0608  WBC 18.8*   < > 16.8* 18.3* 14.3* 17.3* 15.9*  NEUTROABS 15.3*  --   --   --   --   --   --   HGB 8.9*   < > 8.8* 10.1*  8.9* 10.3* 9.5*  HCT 28.2*   < > 28.2* 32.5* 29.0* 31.3* 29.9*  MCV 75.2*   < > 76.2* 75.1* 74.9* 71.8* 73.1*  PLT 826*   < > 770* 791* 776* 973* 856*   < > = values in this interval not displayed.   Cardiac Enzymes: No results for input(s): CKTOTAL, CKMB, CKMBINDEX, TROPONINI in the last 168 hours. BNP: Invalid input(s): POCBNP CBG: Recent Labs  Lab 01/09/20 2101  GLUCAP 96   HbA1C: No results for input(s): HGBA1C in the last 72 hours. Urine analysis:    Component Value Date/Time   COLORURINE YELLOW 01/10/2020 0109   APPEARANCEUR CLEAR 01/10/2020 0109   LABSPEC 1.011 01/10/2020 0109   PHURINE 6.0 01/10/2020 0109   GLUCOSEU NEGATIVE 01/10/2020 0109   HGBUR NEGATIVE 01/10/2020 0109   BILIRUBINUR NEGATIVE 01/10/2020 0109   BILIRUBINUR neg 03/09/2015 1652   KETONESUR 5 (A) 01/10/2020 0109   PROTEINUR NEGATIVE 01/10/2020 0109   UROBILINOGEN negative 03/09/2015 1652  NITRITE NEGATIVE 01/10/2020 0109   LEUKOCYTESUR TRACE (A) 01/10/2020 0109   Sepsis Labs: @LABRCNTIP (procalcitonin:4,lacticidven:4) ) Recent Results (from the past 240 hour(s))  SARS Coronavirus 2 by RT PCR (hospital order, performed in [facility] hospital lab) Nasopharyngeal Nasopharyngeal Swab     Status: None   Collection Time: 01/04/20  1:13 PM   Specimen: Nasopharyngeal Swab  Result Value Ref Range Status   SARS Coronavirus 2 NEGATIVE NEGATIVE Final    Comment: (NOTE) SARS-CoV-2 target nucleic acids are NOT DETECTED.  The SARS-CoV-2 RNA is generally detectable in upper and lower respiratory specimens during the acute phase of infection. The lowest concentration of SARS-CoV-2 viral copies this assay can detect is 250 copies / mL. A negative result does not preclude SARS-CoV-2 infection and should not be used as the sole basis for treatment or other patient management decisions.  A negative result may occur with improper specimen collection / handling, submission of specimen other than nasopharyngeal  swab, presence of viral mutation(s) within the areas targeted by this assay, and inadequate number of viral copies (<250 copies / mL). A negative result must be combined with clinical observations, patient history, and epidemiological information.  Fact Sheet for Patients:   StrictlyIdeas.no  Fact Sheet for Healthcare Providers: BankingDealers.co.za  This test is not yet approved or  cleared by the [place] FDA and has been authorized for detection and/or diagnosis of SARS-CoV-2 by FDA under an Emergency Use Authorization (EUA).  This EUA will remain in effect (meaning this test can be used) for the duration of the COVID-19 declaration under Section 564(b)(1) of the Act, 21 U.S.C. section 360bbb-3(b)(1), unless the authorization is terminated or revoked sooner.  Performed at [facility], [address] [place], [place] [address]   Culture, blood (x 2)     Status: None   Collection Time: 01/04/20  7:07 PM   Specimen: Right Antecubital; Blood  Result Value Ref Range Status   Specimen Description RIGHT ANTECUBITAL  Final   Special Requests   Final    BOTTLES DRAWN AEROBIC AND ANAEROBIC Blood Culture adequate volume   Culture   Final    NO GROWTH 5 DAYS Performed at [facility], [address], [place], [place] [address]    Report Status 01/09/2020 FINAL  Final  Culture, blood (x 2)     Status: None   Collection Time: 01/04/20  7:28 PM   Specimen: BLOOD RIGHT FOREARM  Result Value Ref Range Status   Specimen Description BLOOD RIGHT FOREARM  Final   Special Requests   Final    BOTTLES DRAWN AEROBIC AND ANAEROBIC Blood Culture adequate volume   Culture   Final    NO GROWTH 5 DAYS Performed at [facility], [address] [place], [place] [address]    Report Status 01/09/2020 FINAL  Final  Respiratory Panel by RT PCR (Flu A&B, Covid) - Nasopharyngeal Swab     Status: None   Collection Time: 01/09/20 11:04 PM   Specimen:  Nasopharyngeal Swab  Result Value Ref Range Status   SARS Coronavirus 2 by RT PCR NEGATIVE NEGATIVE Final    Comment: (NOTE) SARS-CoV-2 target nucleic acids are NOT DETECTED.  The SARS-CoV-2 RNA is generally detectable in upper respiratoy specimens during the acute phase of infection. The lowest concentration of SARS-CoV-2 viral copies this assay can detect is 131 copies/mL. A negative result does not preclude SARS-Cov-2 infection and should not be used as the sole basis for treatment or other patient management decisions. A negative  result may occur with  improper specimen collection/handling, submission of specimen other than nasopharyngeal swab, presence of viral mutation(s) within the areas targeted by this assay, and inadequate number of viral copies (<131 copies/mL). A negative result must be combined with clinical observations, patient history, and epidemiological information. The expected result is Negative.  Fact Sheet for Patients:  PinkCheek.be  Fact Sheet for Healthcare Providers:  GravelBags.it  This test is no t yet approved or cleared by the [place] FDA and  has been authorized for detection and/or diagnosis of SARS-CoV-2 by FDA under an Emergency Use Authorization (EUA). This EUA will remain  in effect (meaning this test can be used) for the duration of the COVID-19 declaration under Section 564(b)(1) of the Act, 21 U.S.C. section 360bbb-3(b)(1), unless the authorization is terminated or revoked sooner.     Influenza A by PCR NEGATIVE NEGATIVE Final   Influenza B by PCR NEGATIVE NEGATIVE Final    Comment: (NOTE) The Xpert Xpress SARS-CoV-2/FLU/RSV assay is intended as an aid in  the diagnosis of influenza from Nasopharyngeal swab specimens and  should not be used as a sole basis for treatment. Nasal washings and  aspirates are unacceptable for Xpert Xpress SARS-CoV-2/FLU/RSV  testing.  Fact Sheet  for Patients: PinkCheek.be  Fact Sheet for Healthcare Providers: GravelBags.it  This test is not yet approved or cleared by the [place] FDA and  has been authorized for detection and/or diagnosis of SARS-CoV-2 by  FDA under an Emergency Use Authorization (EUA). This EUA will remain  in effect (meaning this test can be used) for the duration of the  Covid-19 declaration under Section 564(b)(1) of the Act, 21  U.S.C. section 360bbb-3(b)(1), unless the authorization is  terminated or revoked. Performed at [facility], [address] [place], [place] [address]      Scheduled Meds: . aspirin EC  81 mg Oral Daily  . budesonide  2 mL Inhalation BID  . cefdinir  300 mg Oral BID  . divalproex  500 mg Oral BID  . levETIRAcetam  2,000 mg Oral QHS  . metolazone  2.5 mg Oral Daily  . metoprolol tartrate  75 mg Oral BID  . montelukast  10 mg Oral QHS  . multivitamin with minerals  1 tablet Oral Q breakfast  . pantoprazole  40 mg Oral Daily   Continuous Infusions: . diltiazem (CARDIZEM) infusion 10 mg/hr (01/10/20 0238)  . heparin 850 Units/hr (01/10/20 0148)    Procedures/Studies: DG Chest 2 View  Result Date: 01/09/2020 CLINICAL DATA:  Chest pain EXAM: CHEST - 2 VIEW COMPARISON:  01/04/2020 FINDINGS: There are persistent but improved airspace opacities in the right upper lobe. The heart size is stable. There is no pneumothorax. Again noted are findings of thyromegaly. There is no acute osseous abnormality. There are degenerative changes of both glenohumeral joints. IMPRESSION: Persistent but improved airspace opacities in the right upper lobe. Electronically Signed   By: Constance Holster M.D.   On: 01/09/2020 21:54   CT CHEST WO CONTRAST  Result Date: 01/04/2020 CLINICAL DATA:  Cough with weakness and tachycardia. Possible cavitary right upper lobe lesion on radiographs. EXAM: CT CHEST WITHOUT CONTRAST TECHNIQUE:  Multidetector CT imaging of the chest was performed following the standard protocol without IV contrast. COMPARISON:  Radiographs today. CTA 03/12/2019. Thyroid ultrasound 06/02/2014. FINDINGS: Cardiovascular: Atherosclerosis of the aorta, great vessels and coronary arteries. Probable calcifications of the aortic valve. The heart size is normal. There is no pericardial effusion. Mediastinum/Nodes: There are no enlarged  mediastinal, hilar or axillary lymph nodes.Hilar assessment is limited by the lack of intravenous contrast, although the hilar contours appear unchanged. Stable heterogeneous enlargement of both thyroid lobes, right greater than left. This has been previously evaluated by ultrasound. The trachea and esophagus demonstrate no significant findings. Lungs/Pleura: There are new multifocal airspace opacities within the right upper lobe consistent with pneumonia. No evidence of cavitation or dominant mass. There is no significant pleural effusion or pneumothorax. Upper abdomen: The visualized upper abdomen appears stable without suspicious findings. There are probable small cysts in the upper poles of both kidneys. No adrenal mass. Musculoskeletal/Chest wall: There is no chest wall mass or suspicious osseous finding. Multilevel thoracic spondylosis with Schmorl's node formation in the superior endplate of K56. Advanced bilateral shoulder arthropathy. IMPRESSION: 1. New multifocal airspace opacities within the right upper lobe consistent with pneumonia. Radiographic follow-up recommended to document resolution. 2. No evidence of cavitation or dominant mass to suggest neoplasm. 3. Coronary andAortic Atherosclerosis (ICD10-I70.0). Electronically Signed   By: Richardean Sale M.D.   On: 01/04/2020 16:16   DG Chest Port 1 View  Result Date: 01/04/2020 CLINICAL DATA:  Weakness and tachycardia. EXAM: PORTABLE CHEST 1 VIEW COMPARISON:  CTA chest and chest x-ray dated March 12, 2019. FINDINGS: The heart size  and mediastinal contours are within normal limits. Normal pulmonary vascularity. New 5.3 cm lesion in the right upper lobe with central lucency, suggestive of cavitation. No pleural effusion or pneumothorax. No acute osseous abnormality. IMPRESSION: 1. Suspected new 5.3 cm cavitary lesion in the right upper lobe. CT of the chest with contrast is recommended for further evaluation. Electronically Signed   By: Titus Dubin M.D.   On: 01/04/2020 13:45    Orson Eva, DO  [facility]  If 7PM-7AM, please contact night-coverage [email or web address] Password [email or web address] 01/10/2020, 8:04 AM   LOS: 1 day

## 2020-01-10 NOTE — ED Notes (Signed)
Pt was given a lunch tray

## 2020-01-10 NOTE — Progress Notes (Signed)
ANTICOAGULATION CONSULT NOTE - Initial Consult  Pharmacy Consult for apixaban after Heparin  Indication: atrial fibrillation  Allergies  Allergen Reactions  . Advicor [Niacin-Lovastatin Er] Other (See Comments)    Reaction unknown to daughter  . Caduet [Amlodipine-Atorvastatin] Other (See Comments)    Reaction unknown to daughter  . Codeine Other (See Comments)    Reaction unknown to daughter  . Dilantin [Phenytoin] Other (See Comments)    Reaction unknown to daughter  . Lescol [Fluvastatin] Other (See Comments)    Reaction unknown to daughter  . Pamelor [Nortriptyline] Other (See Comments)    Reaction unknown to daughter  . Penicillins Hives    Did it involve swelling of the face/tongue/throat, SOB, or low BP? Unk Did it involve sudden or severe rash/hives, skin peeling, or any reaction on the inside of your mouth or nose? Unk Did you need to seek medical attention at a hospital or doctor's office? Unk When did it last happen? "it was a long time ago" If all above answers are "NO", may proceed with cephalosporin use.   . Sulfa Antibiotics Hives  . Trileptal [Oxcarbazepine] Other (See Comments)    Reaction unknown to daughter  . Ultram [Tramadol] Other (See Comments)    Reaction unknown to daughter  . Welchol [Colesevelam] Other (See Comments)    Reaction unknown to daughter  . Latex Rash  . Tomato Rash   Patient Measurements: Height: 5\' 3"  (160 cm) Weight: 97.4 kg (214 lb 11.7 oz) IBW/kg (Calculated) : 52.4  Vital Signs: Temp: 98 F (36.7 C) (09/25 2100) Temp Source: Oral (09/25 2100) BP: 125/85 (09/26 0800) Pulse Rate: 86 (09/26 0800)  Labs: Recent Labs    01/09/20 2104 01/10/20 0030 01/10/20 0608  HGB 10.3*  --  9.5*  HCT 31.3*  --  29.9*  PLT 973*  --  856*  CREATININE 1.36*  --  1.25*  TROPONINIHS 21* 25*  --     Estimated Creatinine Clearance: 30.6 mL/min (A) (by C-G formula based on SCr of 1.25 mg/dL (H)).   Medical History: Past Medical  History:  Diagnosis Date  . Chronic low back pain   . Degenerative arthritis   . Eczema   . Edema   . Gout   . Headache   . Hiatal hernia   . Hypertension   . Internal hemorrhoids   . Neurogenic claudication   . Obesity   . Seizure disorder ([place])   . Seizures ([place])   . Stroke ([place])   . Urticaria    Assessment: 86 y/o F with chest tightness, starting apixaban after heparin for afib, Hgb 10.3, PTA meds reviewed.   Goal of Therapy:  Anticoagulation on apixaban Monitor platelets by anticoagulation protocol: Yes   Plan:  Stop heparin Start apixaban 5mg  po bid  MWF CBC Monitor for bleeding  Donna Christen Kiyara Bouffard, PharmD, MBA, BCGP Clinical Pharmacist

## 2020-01-10 NOTE — Progress Notes (Signed)
ANTICOAGULATION CONSULT NOTE - Initial Consult  Pharmacy Consult for Heparin  Indication: atrial fibrillation  Allergies  Allergen Reactions  . Advicor [Niacin-Lovastatin Er] Other (See Comments)    Reaction unknown to daughter  . Caduet [Amlodipine-Atorvastatin] Other (See Comments)    Reaction unknown to daughter  . Codeine Other (See Comments)    Reaction unknown to daughter  . Dilantin [Phenytoin] Other (See Comments)    Reaction unknown to daughter  . Lescol [Fluvastatin] Other (See Comments)    Reaction unknown to daughter  . Pamelor [Nortriptyline] Other (See Comments)    Reaction unknown to daughter  . Penicillins Hives    Did it involve swelling of the face/tongue/throat, SOB, or low BP? Unk Did it involve sudden or severe rash/hives, skin peeling, or any reaction on the inside of your mouth or nose? Unk Did you need to seek medical attention at a hospital or doctor's office? Unk When did it last happen? "it was a long time ago" If all above answers are "NO", may proceed with cephalosporin use.   . Sulfa Antibiotics Hives  . Trileptal [Oxcarbazepine] Other (See Comments)    Reaction unknown to daughter  . Ultram [Tramadol] Other (See Comments)    Reaction unknown to daughter  . Welchol [Colesevelam] Other (See Comments)    Reaction unknown to daughter  . Latex Rash  . Tomato Rash   Patient Measurements: Height: 5\' 3"  (160 cm) Weight: 97.4 kg (214 lb 11.7 oz) IBW/kg (Calculated) : 52.4  Vital Signs: Temp: 98 F (36.7 C) (09/25 2100) Temp Source: Oral (09/25 2100) BP: 138/83 (09/26 0113) Pulse Rate: 133 (09/26 0114)  Labs: Recent Labs    01/07/20 0608 01/09/20 2104  HGB 8.9* 10.3*  HCT 29.0* 31.3*  PLT 776* 973*  CREATININE 1.44* 1.36*  TROPONINIHS  --  21*    Estimated Creatinine Clearance: 28.1 mL/min (A) (by C-G formula based on SCr of 1.36 mg/dL (H)).   Medical History: Past Medical History:  Diagnosis Date  . Chronic low back pain   .  Degenerative arthritis   . Eczema   . Edema   . Gout   . Headache   . Hiatal hernia   . Hypertension   . Internal hemorrhoids   . Neurogenic claudication   . Obesity   . Seizure disorder ([place])   . Seizures ([place])   . Stroke ([place])   . Urticaria    Assessment: 86 y/o F with chest tightness, starting heparin for afib, Hgb 10.3, PTA meds reviewed.   Goal of Therapy:  Heparin level 0.3-0.7 units/ml Monitor platelets by anticoagulation protocol: Yes   Plan:  Heparin 3000 units BOLUS Start heparin drip at 850 units/hr 1000 HL Daily CBC/HL Monitor for bleeding  Narda Bonds, PharmD, BCPS Clinical Pharmacist Phone: [phone]

## 2020-01-10 NOTE — H&P (Signed)
TRH H&P    Patient Demographics:    [name], is a 86 y.o. female  MRN: [id number]  DOB - 12-07-1923  Admit Date - 01/09/2020  Referring MD/NP/PA: Alvino Chapel  Outpatient Primary MD for the patient is [name], [name]  Patient coming from: Home  Chief complaint-chest tightness   HPI:    [name]  is a 86 y.o. female, with history of stroke, seizures, neurogenic claudication, hypertension, eczema, degenerative arthritis, and more presents to the ER via EMS.  Apparently patient was complaining of chest tightness and needing help when EMS picked her up.  At the time of my exam she states she does not know why she is here she reports that she does not done anything to no one, and she was just trying to get some food, she has not had anything to eat, she does not know what she is done to deserve this.  Patient reports that she is not having chest pain at this time.  She reports no pain.  She does report that she is cold.  Patient is not able to provide more history.  Patient was recently discharged from the hospital on September 23.  At that time she had SVT and pneumonia.  She was initially treated with cefepime and bank in the ED and given 1 L bolus normal saline.  Her antibiotics were eventually changed to Rocephin and Flagyl for aspiration pneumonia.  She was discharged with 4 days of cefdinir to complete a 7-day course.  She was also discharged with nystatin, and polymyxin for conjunctivitis.  Patient did have SVT with a Lysle Rubens C at last admission.  Troponin was normal.  Patient was evaluated by PT and home health PT was advised.  At that time history was unclear as well patient had reported that she can get out of bed and a neighbor found her and called EMS.  Patiently apparently lives alone, and attends to her own ADLs.  She reports she does everything on her own and she is 86 years old.  In the  ED Temperature 98, heart rate 130-140, respiratory rate 27, blood pressure 163/91, satting at 98%  leukocytosis at 17.3, thrombocytosis at 973 , Troponin is 21, up from 14 at last admission Creatinine is 1.36 at baseline Chest x-ray shows improved airspace opacities in the right upper lobe Patient was started on Cardizem drip EKG had shown heart rate 129, a flutter with AV block variant, QTc 464     Review of systems:    In addition to the HPI above,  Review of Systems  Unable to perform ROS: Dementia       Past History of the following :    Past Medical History:  Diagnosis Date  . Chronic low back pain   . Degenerative arthritis   . Eczema   . Edema   . Gout   . Headache   . Hiatal hernia   . Hypertension   . Internal hemorrhoids   . Neurogenic claudication   . Obesity   . Seizure  disorder ([place])   . Seizures ([place])   . Stroke ([place])   . Urticaria       Past Surgical History:  Procedure Laterality Date  . ABDOMINAL HYSTERECTOMY    . APPENDECTOMY    . APPENDECTOMY    . bilateral knees    . cataracts    . CHOLECYSTECTOMY    . mammoplasty reduction    . NEUROPLASTY / TRANSPOSITION MEDIAN NERVE AT CARPAL TUNNEL BILATERAL    . RE-EXCISION OF BREAST LUMPECTOMY    . TUBAL LIGATION        Social History:      Social History   Tobacco Use  . Smoking status: Never Smoker  . Smokeless tobacco: Never Used  Substance Use Topics  . Alcohol use: No       Family History :     Family History  Problem Relation Age of Onset  . Diabetes Mother   . Heart disease Mother   . Heart disease Father   . Cancer Sister   . Cancer Brother   . Cancer Sister       Home Medications:   Prior to Admission medications   Medication Sig Start Date End Date Taking? Authorizing Provider  acetaminophen (TYLENOL) 500 MG tablet Take 500-1,000 mg by mouth every 8 (eight) hours as needed for mild pain or headache.     [provider]  allopurinol (ZYLOPRIM) 100 MG  tablet TAKE 1/2 TABLET BY MOUTH EVERY DAY Patient taking differently: Take 50 mg by mouth daily.  09/24/19   [name] A, [name]  aspirin 81 MG tablet Take 81 mg by mouth daily.    [provider]  cefdinir (OMNICEF) 300 MG capsule Take 1 capsule (300 mg total) by mouth 2 (two) times daily for 4 days. 01/07/20 01/11/20  Manuella Ghazi, Pratik D, DO  cetirizine (ZYRTEC) 10 MG tablet TAKE 1 TABLET BY MOUTH EVERY DAY Patient taking differently: Take 10 mg by mouth daily.  09/25/19   [name] A, [name]  divalproex (DEPAKOTE) 250 MG DR tablet TAKE ONE TABLET BY MOUTH TWICE A DAY FOR 2 WEEKS, THEN TAKE 2 TABLETS TWICE A DAY THEREAFTER Patient taking differently: Take 500 mg by mouth 2 (two) times daily.  10/20/19   Kathrynn Ducking, MD  famotidine (PEPCID) 40 MG tablet Take 1 tablet (40 mg total) by mouth daily. Patient taking differently: Take 40 mg by mouth daily as needed for heartburn.  04/08/19   Kozlow, Donnamarie Poag, MD  Fluticasone Furoate (ARNUITY ELLIPTA) 200 MCG/ACT AEPB INHALE 1 PUFF BY MOUTH EVERY DAY Patient taking differently: Take 1 puff by mouth daily.  12/03/19   Kozlow, Donnamarie Poag, MD  ketoconazole (NIZORAL) 2 % cream Apply to the affected areas underneath breasts, belly and in groin region ONCE daily x2-4 weeks. Patient taking differently: Apply 1 application topically See admin instructions. Apply to the affected areas underneath breasts, belly and in groin region ONCE daily for 2-4 weeks 09/16/18   Ronnie Doss M, DO  levETIRAcetam (KEPPRA XR) 500 MG 24 hr tablet Take 4 tablets (2,000 mg total) by mouth at bedtime. 11/10/18   Suzzanne Cloud, NP  metolazone (ZAROXOLYN) 2.5 MG tablet Take 1 Tablet by mouth on Mondays, Wednesdays and Fridays 08/17/19   [name] A, [name]  metoprolol tartrate 75 MG TABS Take 75 mg by mouth 2 (two) times daily. 01/07/20 02/06/20  Manuella Ghazi, Pratik D, DO  mometasone (ELOCON) 0.1 % ointment Apply to skin once daily as directed after  bathing. 03/04/18   Kozlow, Donnamarie Poag, MD    montelukast (SINGULAIR) 10 MG tablet TAKE 1 TABLET BY MOUTH EVERYDAY AT BEDTIME Patient taking differently: Take 10 mg by mouth at bedtime.  12/03/19   Kozlow, Donnamarie Poag, MD  Multiple Vitamins-Minerals (ONE-A-DAY WOMENS 50 PLUS) TABS Take 1 tablet by mouth daily with breakfast.    [provider]  nystatin (MYCOSTATIN) 100000 UNIT/ML suspension Take 5 mLs (500,000 Units total) by mouth 4 (four) times daily. 12/28/19   Dettinger, Fransisca Kaufmann, MD  nystatin-triamcinolone ointment [facility]) Apply 1 application topically 2 (two) times daily. 11/16/15   [name], [name]  omeprazole (PRILOSEC) 40 MG capsule Take 1 capsule (40 mg total) by mouth daily. Patient taking differently: Take 40 mg by mouth daily as needed (for reflux).  07/03/18   Kozlow, Donnamarie Poag, MD  triamcinolone cream (KENALOG) 0.1 % Apply 1 application topically 2 (two) times daily. Patient taking differently: Apply 1 application topically 2 (two) times daily as needed (for eczema).  03/18/18   Kozlow, Donnamarie Poag, MD  trimethoprim-polymyxin b (POLYTRIM) ophthalmic solution Place 1 drop into the right eye every 6 (six) hours. 12/18/18   [name], [name]     Allergies:     Allergies  Allergen Reactions  . Advicor [Niacin-Lovastatin Er] Other (See Comments)    Reaction unknown to daughter  . Caduet [Amlodipine-Atorvastatin] Other (See Comments)    Reaction unknown to daughter  . Codeine Other (See Comments)    Reaction unknown to daughter  . Dilantin [Phenytoin] Other (See Comments)    Reaction unknown to daughter  . Lescol [Fluvastatin] Other (See Comments)    Reaction unknown to daughter  . Pamelor [Nortriptyline] Other (See Comments)    Reaction unknown to daughter  . Penicillins Hives    Did it involve swelling of the face/tongue/throat, SOB, or low BP? Unk Did it involve sudden or severe rash/hives, skin peeling, or any reaction on the inside of your mouth or nose? Unk Did you need to seek medical attention at a hospital or  doctor's office? Unk When did it last happen? "it was a long time ago" If all above answers are "NO", may proceed with cephalosporin use.   . Sulfa Antibiotics Hives  . Trileptal [Oxcarbazepine] Other (See Comments)    Reaction unknown to daughter  . Ultram [Tramadol] Other (See Comments)    Reaction unknown to daughter  . Welchol [Colesevelam] Other (See Comments)    Reaction unknown to daughter  . Latex Rash  . Tomato Rash     Physical Exam:   Vitals  Blood pressure 118/68, pulse (!) 137, temperature 98 F (36.7 C), temperature source Oral, resp. rate 18, height 5\' 3"  (1.6 m), weight 97.4 kg, SpO2 98 %.  1.  General: Lying supine in bed  2. Psychiatric: Irritable, yelling out she does not know what she did to deserve this or to be here, she would not bother anybody, she was just trying to get food Oriented to self and month, but not to year or place Unable to report why she is here, but cooperative with exam  3. Neurologic: At baseline, no acute deficits, equal sensation in the upper and lower extremities bilaterally  4. HEENMT:  Head is atraumatic, normocephalic, right eyelid droops compared to left, sclera are clear, neck is supple, trachea is midline, mucous membranes are moist  5. Respiratory : Lungs are clear to auscultation bilaterally, no cyanosis or clubbing  6. Cardiovascular : Heart rate is  tachycardic, and irregularly irregular -patient likely going in and out of A. fib from a flutter, peripheral pulses palpated  7. Gastrointestinal:  Abdomen is soft, nondistended, nontender to palpation  8. Skin:  No acute lesion on limited skin exam  9.Musculoskeletal:  No acute deformity    Data Review:    CBC Recent Labs  Lab 01/05/20 0130 01/05/20 0622 01/06/20 0356 01/07/20 0608 01/09/20 2104  WBC 18.8* 16.8* 18.3* 14.3* 17.3*  HGB 8.9* 8.8* 10.1* 8.9* 10.3*  HCT 28.2* 28.2* 32.5* 29.0* 31.3*  PLT 826* 770* 791* 776* 973*  MCV 75.2* 76.2* 75.1*  74.9* 71.8*  MCH 23.7* 23.8* 23.3* 23.0* 23.6*  MCHC 31.6 31.2 31.1 30.7 32.9  RDW 18.2* 18.7* 18.6* 18.6* 19.6*  LYMPHSABS 2.7  --   --   --   --   MONOABS 0.1  --   --   --   --   EOSABS 0.1  --   --   --   --   BASOSABS 0.5*  --   --   --   --    ------------------------------------------------------------------------------------------------------------------  Results for orders placed or performed during the hospital encounter of 01/09/20 (from the past 48 hour(s))  CBG monitoring, ED     Status: None   Collection Time: 01/09/20  9:01 PM  Result Value Ref Range   Glucose-Capillary 96 70 - 99 mg/dL    Comment: Glucose reference range applies only to samples taken after fasting for at least 8 hours.  Basic metabolic panel     Status: Abnormal   Collection Time: 01/09/20  9:04 PM  Result Value Ref Range   Sodium 139 135 - 145 mmol/L   Potassium 3.6 3.5 - 5.1 mmol/L   Chloride 102 98 - 111 mmol/L   CO2 23 22 - 32 mmol/L   Glucose, Bld 104 (H) 70 - 99 mg/dL    Comment: Glucose reference range applies only to samples taken after fasting for at least 8 hours.   BUN 26 (H) 8 - 23 mg/dL   Creatinine, Ser 1.36 (H) 0.44 - 1.00 mg/dL   Calcium 10.3 8.9 - 10.3 mg/dL   GFR calc non Af Amer 33 (L) >60 mL/min   GFR calc Af Amer 39 (L) >60 mL/min   Anion gap 14 5 - 15    Comment: Performed at [facility], [address], [place], [place] [address]  CBC     Status: Abnormal   Collection Time: 01/09/20  9:04 PM  Result Value Ref Range   WBC 17.3 (H) 4.0 - 10.5 K/uL    Comment: WHITE COUNT CONFIRMED ON SMEAR   RBC 4.36 3.87 - 5.11 MIL/uL   Hemoglobin 10.3 (L) 12.0 - 15.0 g/dL   HCT 31.3 (L) 36 - 46 %   MCV 71.8 (L) 80.0 - 100.0 fL   MCH 23.6 (L) 26.0 - 34.0 pg   MCHC 32.9 30.0 - 36.0 g/dL   RDW 19.6 (H) 11.5 - 15.5 %   Platelets 973 (HH) 150 - 400 K/uL    Comment: This critical result has verified and been called to SAPPELT,J by Duncan Dull on 09 25 2021 at 2135, and has been read  back.    nRBC 1.9 (H) 0.0 - 0.2 %    Comment: Performed at [facility], [address], [place], [place] [address]  Troponin I (High Sensitivity)     Status: Abnormal   Collection Time: 01/09/20  9:04 PM  Result Value Ref Range  Troponin I (High Sensitivity) 21 (H) <18 ng/L    Comment: (NOTE) Elevated high sensitivity troponin I (hsTnI) values and significant  changes across serial measurements may suggest ACS but many other  chronic and acute conditions are known to elevate hsTnI results.  Refer to the "Links" section for chest pain algorithms and additional  guidance. Performed at [facility], [address] [place], [place] [address]   Valproic acid level     Status: Abnormal   Collection Time: 01/09/20  9:04 PM  Result Value Ref Range   Valproic Acid Lvl <10 (L) 50.0 - 100.0 ug/mL    Comment: RESULTS CONFIRMED BY MANUAL DILUTION Performed at [facility], [address] [place], [place] [address]   Respiratory Panel by RT PCR (Flu A&B, Covid) - Nasopharyngeal Swab     Status: None   Collection Time: 01/09/20 11:04 PM   Specimen: Nasopharyngeal Swab  Result Value Ref Range   SARS Coronavirus 2 by RT PCR NEGATIVE NEGATIVE    Comment: (NOTE) SARS-CoV-2 target nucleic acids are NOT DETECTED.  The SARS-CoV-2 RNA is generally detectable in upper respiratoy specimens during the acute phase of infection. The lowest concentration of SARS-CoV-2 viral copies this assay can detect is 131 copies/mL. A negative result does not preclude SARS-Cov-2 infection and should not be used as the sole basis for treatment or other patient management decisions. A negative result may occur with  improper specimen collection/handling, submission of specimen other than nasopharyngeal swab, presence of viral mutation(s) within the areas targeted by this assay, and inadequate number of viral copies (<131 copies/mL). A negative result must be combined with clinical observations, patient history, and  epidemiological information. The expected result is Negative.  Fact Sheet for Patients:  PinkCheek.be  Fact Sheet for Healthcare Providers:  GravelBags.it  This test is no t yet approved or cleared by the [place] FDA and  has been authorized for detection and/or diagnosis of SARS-CoV-2 by FDA under an Emergency Use Authorization (EUA). This EUA will remain  in effect (meaning this test can be used) for the duration of the COVID-19 declaration under Section 564(b)(1) of the Act, 21 U.S.C. section 360bbb-3(b)(1), unless the authorization is terminated or revoked sooner.     Influenza A by PCR NEGATIVE NEGATIVE   Influenza B by PCR NEGATIVE NEGATIVE    Comment: (NOTE) The Xpert Xpress SARS-CoV-2/FLU/RSV assay is intended as an aid in  the diagnosis of influenza from Nasopharyngeal swab specimens and  should not be used as a sole basis for treatment. Nasal washings and  aspirates are unacceptable for Xpert Xpress SARS-CoV-2/FLU/RSV  testing.  Fact Sheet for Patients: PinkCheek.be  Fact Sheet for Healthcare Providers: GravelBags.it  This test is not yet approved or cleared by the [place] FDA and  has been authorized for detection and/or diagnosis of SARS-CoV-2 by  FDA under an Emergency Use Authorization (EUA). This EUA will remain  in effect (meaning this test can be used) for the duration of the  Covid-19 declaration under Section 564(b)(1) of the Act, 21  U.S.C. section 360bbb-3(b)(1), unless the authorization is  terminated or revoked. Performed at [facility], [address], [place], [place] [address]     Chemistries  Recent Labs  Lab 01/05/20 0130 01/05/20 0622 01/06/20 0356 01/07/20 0608 01/09/20 2104  NA 137 138 138 139 139  K 3.9 3.8 4.0 4.2 3.6  CL 102 104 100 104 102  CO2 25 25 25 25 23   GLUCOSE 121* 100* 89 82  104*  BUN 31* 30*  26* 28* 26*  CREATININE 1.55* 1.54* 1.38* 1.44* 1.36*  CALCIUM 9.2 9.1 9.6 9.0 10.3  MG  --   --   --  1.8  --   AST 23  --   --   --   --   ALT 18  --   --   --   --   ALKPHOS 90  --   --   --   --   BILITOT 0.3  --   --   --   --    ------------------------------------------------------------------------------------------------------------------  ------------------------------------------------------------------------------------------------------------------ GFR: Estimated Creatinine Clearance: 28.1 mL/min (A) (by C-G formula based on SCr of 1.36 mg/dL (H)). Liver Function Tests: Recent Labs  Lab 01/05/20 0130  AST 23  ALT 18  ALKPHOS 90  BILITOT 0.3  PROT 7.2  ALBUMIN 2.6*   No results for input(s): LIPASE, AMYLASE in the last 168 hours. No results for input(s): AMMONIA in the last 168 hours. Coagulation Profile: Recent Labs  Lab 01/05/20 0130  INR 1.3*   Cardiac Enzymes: No results for input(s): CKTOTAL, CKMB, CKMBINDEX, TROPONINI in the last 168 hours. BNP (last 3 results) No results for input(s): PROBNP in the last 8760 hours. HbA1C: No results for input(s): HGBA1C in the last 72 hours. CBG: Recent Labs  Lab 01/09/20 2101  GLUCAP 96   Lipid Profile: No results for input(s): CHOL, HDL, LDLCALC, TRIG, CHOLHDL, LDLDIRECT in the last 72 hours. Thyroid Function Tests: No results for input(s): TSH, T4TOTAL, FREET4, T3FREE, THYROIDAB in the last 72 hours. Anemia Panel: No results for input(s): VITAMINB12, FOLATE, FERRITIN, TIBC, IRON, RETICCTPCT in the last 72 hours.  --------------------------------------------------------------------------------------------------------------- Urine analysis:    Component Value Date/Time   COLORURINE STRAW (A) 03/12/2019 1722   APPEARANCEUR CLEAR 03/12/2019 1722   LABSPEC 1.005 03/12/2019 1722   PHURINE 9.0 (H) 03/12/2019 1722   GLUCOSEU NEGATIVE 03/12/2019 1722   HGBUR NEGATIVE 03/12/2019 1722   BILIRUBINUR NEGATIVE  03/12/2019 1722   BILIRUBINUR neg 03/09/2015 1652   KETONESUR 5 (A) 03/12/2019 1722   PROTEINUR NEGATIVE 03/12/2019 1722   UROBILINOGEN negative 03/09/2015 1652   NITRITE NEGATIVE 03/12/2019 1722   LEUKOCYTESUR NEGATIVE 03/12/2019 1722      Imaging Results:    DG Chest 2 View  Result Date: 01/09/2020 CLINICAL DATA:  Chest pain EXAM: CHEST - 2 VIEW COMPARISON:  01/04/2020 FINDINGS: There are persistent but improved airspace opacities in the right upper lobe. The heart size is stable. There is no pneumothorax. Again noted are findings of thyromegaly. There is no acute osseous abnormality. There are degenerative changes of both glenohumeral joints. IMPRESSION: Persistent but improved airspace opacities in the right upper lobe. Electronically Signed   By: Constance Holster M.D.   On: 01/09/2020 21:54    My personal review of EKG: Rhythm atrial flutter, Rate 129/min, QTc 464,no Acute ST changes   Assessment & Plan:    Active Problems:   A-fib ([place])   1. Chest pain 1. Patient is unable to describe her chest pain at this time 2. Initial troponin was 21, trend 3. Initial EKG shows a flutter heart rate 129, no specific ischemic changes 4. Repeat EKG in a.m. 5. Chest x-ray showed improved airspace opacities in the right upper lobe 6. Continue to monitor 2. A flutter 1. Initial EKG shows a heart rate of 129 with a flutter 2. Patient started on Cardizem drip 3. CHA2DS2-VASc score of 4 4. Started on full dose heparin 5. Echo in  the a.m. 6. Check TSH  7. Recent pulmonary disease -treatment for pneumonia -could be contributing to change in rhythm 8. Monitor on stepdown 3. Demand ischemia 1. Initial troponin slightly elevated at 21 2. Trend troponin 3. Likely secondary to demand ischemia given heart rates of 1 30-1 40 at presentation 4. Continue to monitor 4. Thrombocytosis/leukocytosis 1. At discharge white blood cell count was 14.3 and thrombocytosis was 773 2. Today white blood  cell count is 17.3, and thrombocytosis is 973 3. Acute phase reaction most likely 4. Trend with a CBC in the a.m. 5. Pneumonia 1. Chest x-ray shows improved airspace opacities 2. Discharged with cefdinir -continue cefdinir 3. Check UA 4. Continue to monitor   DVT Prophylaxis-   Heparin- SCDs   AM Labs Ordered, also please review Full Orders  Family Communication: No family at bedside Code Status: Full  Admission status: Inpatient :The appropriate admission status for this patient is INPATIENT. Inpatient status is judged to be reasonable and necessary in order to provide the required intensity of service to ensure the patient's safety. The patient's presenting symptoms, physical exam findings, and initial radiographic and laboratory data in the context of their chronic comorbidities is felt to place them at high risk for further clinical deterioration. Furthermore, it is not anticipated that the patient will be medically stable for discharge from the hospital within 2 midnights of admission. The following factors support the admission status of inpatient.     The patient's presenting symptoms include patient is unable to report her presenting symptoms however EMS had reported chest tightness and calling for help The worrisome physical exam findings include EKG shows a flutter, heart rate tachycardic The initial radiographic and laboratory data are worrisome because of : Laboratory data shows a leukocytosis of 17.3, a thrombocytosis of 973, initial troponin is mildly elevated at 21, EKG shows a flutter The chronic co-morbidities include with history of CVA, seizures, neurogenic claudication, hypertension, gout, eczema, degenerative arthritis, and recent hospitalization for SVT       * I certify that at the point of admission it is my clinical judgment that the patient will require inpatient hospital care spanning beyond 2 midnights from the point of admission due to high intensity of  service, high risk for further deterioration and high frequency of surveillance required.*  Time spent in minutes : [place]

## 2020-01-10 NOTE — Progress Notes (Signed)
  Echocardiogram 2D Echocardiogram has been performed.  [name] 01/10/2020, 9:12 AM

## 2020-01-11 ENCOUNTER — Other Ambulatory Visit: Payer: Self-pay

## 2020-01-11 ENCOUNTER — Inpatient Hospital Stay: Payer: Medicare PPO | Admitting: Nurse Practitioner

## 2020-01-11 DIAGNOSIS — Z8673 Personal history of transient ischemic attack (TIA), and cerebral infarction without residual deficits: Secondary | ICD-10-CM

## 2020-01-11 DIAGNOSIS — I4892 Unspecified atrial flutter: Secondary | ICD-10-CM

## 2020-01-11 DIAGNOSIS — Z87898 Personal history of other specified conditions: Secondary | ICD-10-CM

## 2020-01-11 LAB — BASIC METABOLIC PANEL
Anion gap: 9 (ref 5–15)
BUN: 25 mg/dL — ABNORMAL HIGH (ref 8–23)
CO2: 27 mmol/L (ref 22–32)
Calcium: 9.3 mg/dL (ref 8.9–10.3)
Chloride: 104 mmol/L (ref 98–111)
Creatinine, Ser: 1.53 mg/dL — ABNORMAL HIGH (ref 0.44–1.00)
GFR calc Af Amer: 33 mL/min — ABNORMAL LOW (ref >60)
GFR calc non Af Amer: 29 mL/min — ABNORMAL LOW (ref >60)
Glucose, Bld: 99 mg/dL (ref 70–99)
Potassium: 4.3 mmol/L (ref 3.5–5.1)
Sodium: 140 mmol/L (ref 135–145)

## 2020-01-11 LAB — CBC
HCT: 28.4 % — ABNORMAL LOW (ref 36.0–46.0)
Hemoglobin: 8.9 g/dL — ABNORMAL LOW (ref 12.0–15.0)
MCH: 23.1 pg — ABNORMAL LOW (ref 26.0–34.0)
MCHC: 31.3 g/dL (ref 30.0–36.0)
MCV: 73.6 fL — ABNORMAL LOW (ref 80.0–100.0)
Platelets: 777 10*3/uL — ABNORMAL HIGH (ref 150–400)
RBC: 3.86 MIL/uL — ABNORMAL LOW (ref 3.87–5.11)
RDW: 19 % — ABNORMAL HIGH (ref 11.5–15.5)
WBC: 16 10*3/uL — ABNORMAL HIGH (ref 4.0–10.5)
nRBC: 3 % — ABNORMAL HIGH (ref 0.0–0.2)

## 2020-01-11 LAB — MAGNESIUM: Magnesium: 1.8 mg/dL (ref 1.7–2.4)

## 2020-01-11 LAB — PATHOLOGIST SMEAR REVIEW

## 2020-01-11 MED ORDER — SODIUM CHLORIDE 0.9 % IN NEBU
INHALATION_SOLUTION | RESPIRATORY_TRACT | Status: AC
  Filled 2020-01-11: qty 3

## 2020-01-11 MED ORDER — DILTIAZEM HCL ER COATED BEADS 120 MG PO CP24
120.0000 mg | ORAL_CAPSULE | Freq: Every day | ORAL | Status: DC
  Administered 2020-01-11 – 2020-01-12 (×2): 120 mg via ORAL
  Filled 2020-01-11 (×5): qty 1

## 2020-01-11 MED ORDER — APIXABAN 2.5 MG PO TABS: 2.5000 mg | ORAL_TABLET | Freq: Two times a day (BID) | ORAL | Status: DC

## 2020-01-11 MED ORDER — FAMOTIDINE 20 MG PO TABS: 20.0000 mg | ORAL_TABLET | Freq: Every day | ORAL | Status: DC | PRN

## 2020-01-11 MED ORDER — GUAIFENESIN 100 MG/5ML PO SOLN: 5.0000 mL | ORAL | Status: DC | PRN

## 2020-01-11 MED ORDER — POLYVINYL ALCOHOL 1.4 % OP SOLN
1.0000 [drp] | OPHTHALMIC | Status: DC | PRN
  Filled 2020-01-11: qty 15

## 2020-01-11 MED ORDER — HEPARIN SODIUM (PORCINE) 5000 UNIT/ML IJ SOLN
5000.0000 [IU] | Freq: Three times a day (TID) | INTRAMUSCULAR | Status: DC
  Administered 2020-01-11 – 2020-01-12 (×3): 5000 [IU] via SUBCUTANEOUS
  Filled 2020-01-11 (×4): qty 1

## 2020-01-11 MED ORDER — CEFDINIR 300 MG PO CAPS
300.0000 mg | ORAL_CAPSULE | Freq: Every day | ORAL | Status: DC
  Administered 2020-01-12: 300 mg via ORAL
  Filled 2020-01-11: qty 1

## 2020-01-11 MED ORDER — ASPIRIN EC 81 MG PO TBEC
81.0000 mg | DELAYED_RELEASE_TABLET | Freq: Every day | ORAL | Status: DC
  Administered 2020-01-12: 81 mg via ORAL
  Filled 2020-01-11: qty 1

## 2020-01-11 NOTE — Discharge Summary (Signed)
Physician Discharge Summary  [name] MEQ:[id number] DOB: 02-16-1924 DOA: 01/09/2020  PCP: [name], [name]  Admit date: 01/09/2020 Discharge date: 01/12/20   Admitted From: home Disposition:  SNF  Recommendations for Outpatient Follow-up:  1. Follow up with PCP in 1-2 weeks 2. Please obtain BMP/CBC in one week :    Discharge Condition: Stable CODE STATUS: FULL Diet recommendation: Heart Healthy    Brief/Interim Summary: 86 year old female with a history of hypertension, hyperlipidemia, stroke, seizure disorder, cognitive impairment presenting with chest pain and shortness of breath.  Unfortunately, the patient is a poor historian secondary to her cognitive impairment.  History is obtained from review of the medical record and speaking with the patient's daughter.  Notably, the patient was recently discharged from the hospital after a stay from 01/04/2020 to 01/07/2020 during which she was treated for sepsis secondary to pneumonia.  She was discharged home with cefdinir for 4 additional days.  During the hospitalization, she had an episode of SVT for which her metoprolol tartrate was increased to 75 mg twice daily.  She was discharged home with home health physical therapy.  Apparently, the patient's daughter stated that the patient was "normal" on 01/08/2020.  However, on 01/09/2020, the patient called her daughter stating that she was short of breath and coughing.  Therefore, the patient's neighbor was asked to come by to check up on her.  Apparently the patient has been having loose stools since arrival back to the home.  The patient herself denies any abdominal pain nausea, vomiting.  History has been difficult secondary to the patient's tangential speech.  Apparently EMS was activated at which time the patient had been complaining of some chest pain.  She was noted to be tachycardic. In the emergency department, the patient was afebrile hemodynamically stable with oxygen saturation  94-95% room air.  She was noted to have atrial fibrillation with RVR.  She was started on diltiazem drip and IV heparin.  Chest x-ray showed improving right upper lobe opacity.  BMP was essentially unremarkable with her serum creatinine at baseline.  LFTs were unremarkable.  WBC 17.3, hemoglobin 10.3, platelets 173,000.  Troponin 21>>25.  Discharge Diagnoses:  Atrial fibrillation with RVR, type unspecified -spontaneously converted to sinus AM 9/26 -transition to podiltiazem>>dilitiazem CD -CHADS-VASc = 7 -Discontinue IV heparin -Started apixaban initially, but discontinued after speaking with daughter-->frequent falls -start ASA -Echo--EF 60-65%, no WMA, G1DD -Continue metoprololat lower dose--doubt she has been taking properly at home -TSH 1.459 -appreciate cardiology consult  Elevated troponin -Secondary to demand ischemia -Personally reviewed EKG--atrial fibrillation, nonspecific ST changes -Personally reviewed chest x-ray--including right upper lobe opacity; no edema -Echocardiogram--EF 60-65%, no WMA; G1DD, trivial MR  Leukocytosis/Thrombocytosis -Suspect the patient may have myelodysplastic syndrome given her concomitant significant elevation in platelets -Certainly, the patient may have an infectious process versus stress demargination -Blood cultures x2 sets--neg to date -UA negative for pyuria -Finished course of cefdinir during hospitalization -Stool for C. difficile given history of loose stools at home -iron saturation 27%, ferritin 336 -B12--5403 -may need outpatient hematology follow up  CKD stage IIIb -Baseline creatinine 1.3-1.5 -A.m. BMP  Seizure disorder -Doubt patient has been taking her Depakote or Keppra properly -This was confirmed after speaking with the patient's daughter -VPA level undetectable -Restarted Keppra and VPA  Asthma -continue pulmicort -continue singulair  Hypomagnesemia -repleted  Morbid Obesity -BMI--38.04 -Lifestyle  modification  GERD -continue PPI  Deconditioning -PT eval-->SNF  Discharge Instructions   Allergies as of 01/12/2020  Reactions   Advicor [niacin-lovastatin Er] Other (See Comments)   Reaction unknown to daughter   Caduet [amlodipine-atorvastatin] Other (See Comments)   Reaction unknown to daughter   Codeine Other (See Comments)   Reaction unknown to daughter   Dilantin [phenytoin] Other (See Comments)   Reaction unknown to daughter   Lescol [fluvastatin] Other (See Comments)   Reaction unknown to daughter   Pamelor [nortriptyline] Other (See Comments)   Reaction unknown to daughter   Penicillins Hives   Did it involve swelling of the face/tongue/throat, SOB, or low BP? Unk Did it involve sudden or severe rash/hives, skin peeling, or any reaction on the inside of your mouth or nose? Unk Did you need to seek medical attention at a hospital or doctor's office? Unk When did it last happen? "it was a long time ago" If all above answers are "NO", may proceed with cephalosporin use.   Sulfa Antibiotics Hives   Trileptal [oxcarbazepine] Other (See Comments)   Reaction unknown to daughter   Ultram [tramadol] Other (See Comments)   Reaction unknown to daughter   Welchol [colesevelam] Other (See Comments)   Reaction unknown to daughter   Latex Rash   Tomato Rash      Medication List    STOP taking these medications   cefdinir 300 MG capsule Commonly known as: OMNICEF   trimethoprim-polymyxin b ophthalmic solution Commonly known as: Polytrim     TAKE these medications   acetaminophen 500 MG tablet Commonly known as: TYLENOL Take 500-1,000 mg by mouth every 8 (eight) hours as needed for mild pain or headache.   allopurinol 100 MG tablet Commonly known as: ZYLOPRIM TAKE 1/2 TABLET BY MOUTH EVERY DAY   Arnuity Ellipta 200 MCG/ACT Aepb Generic drug: Fluticasone Furoate INHALE 1 PUFF BY MOUTH EVERY DAY What changed:   how much to take  how to take this  when  to take this  additional instructions   aspirin 81 MG tablet Take 81 mg by mouth daily.   cetirizine 10 MG tablet Commonly known as: ZYRTEC TAKE 1 TABLET BY MOUTH EVERY DAY   diltiazem 120 MG 24 hr capsule Commonly known as: CARDIZEM CD Take 1 capsule (120 mg total) by mouth daily.   divalproex 250 MG DR tablet Commonly known as: DEPAKOTE TAKE ONE TABLET BY MOUTH TWICE A DAY FOR 2 WEEKS, THEN TAKE 2 TABLETS TWICE A DAY THEREAFTER What changed:   how much to take  how to take this  when to take this  additional instructions   famotidine 40 MG tablet Commonly known as: PEPCID Take 1 tablet (40 mg total) by mouth daily. What changed:   when to take this  reasons to take this   ketoconazole 2 % cream Commonly known as: NIZORAL Apply to the affected areas underneath breasts, belly and in groin region ONCE daily x2-4 weeks. What changed:   how much to take  how to take this  when to take this  additional instructions   levETIRAcetam 500 MG 24 hr tablet Commonly known as: KEPPRA XR Take 4 tablets (2,000 mg total) by mouth at bedtime.   metolazone 2.5 MG tablet Commonly known as: ZAROXOLYN Take 1 Tablet by mouth on Mondays, Wednesdays and Fridays   metoprolol tartrate 25 MG tablet Commonly known as: LOPRESSOR Take 1 tablet (25 mg total) by mouth 2 (two) times daily. What changed:   medication strength  how much to take   mometasone 0.1 % ointment Commonly known as: ELOCON Apply to  skin once daily as directed after bathing.   montelukast 10 MG tablet Commonly known as: SINGULAIR TAKE 1 TABLET BY MOUTH EVERYDAY AT BEDTIME What changed:   how much to take  how to take this  when to take this  additional instructions   nystatin 100000 UNIT/ML suspension Commonly known as: MYCOSTATIN Take 5 mLs (500,000 Units total) by mouth 4 (four) times daily.   nystatin-triamcinolone ointment Commonly known as: MYCOLOG Apply 1 application topically 2  (two) times daily.   omeprazole 40 MG capsule Commonly known as: PriLOSEC Take 1 capsule (40 mg total) by mouth daily. What changed:   when to take this  reasons to take this   One-A-Day Womens 50 Plus Tabs Take 1 tablet by mouth daily with breakfast.   triamcinolone cream 0.1 % Commonly known as: KENALOG Apply 1 application topically 2 (two) times daily. What changed:   when to take this  reasons to take this       Allergies  Allergen Reactions  . Advicor [Niacin-Lovastatin Er] Other (See Comments)    Reaction unknown to daughter  . Caduet [Amlodipine-Atorvastatin] Other (See Comments)    Reaction unknown to daughter  . Codeine Other (See Comments)    Reaction unknown to daughter  . Dilantin [Phenytoin] Other (See Comments)    Reaction unknown to daughter  . Lescol [Fluvastatin] Other (See Comments)    Reaction unknown to daughter  . Pamelor [Nortriptyline] Other (See Comments)    Reaction unknown to daughter  . Penicillins Hives    Did it involve swelling of the face/tongue/throat, SOB, or low BP? Unk Did it involve sudden or severe rash/hives, skin peeling, or any reaction on the inside of your mouth or nose? Unk Did you need to seek medical attention at a hospital or doctor's office? Unk When did it last happen? "it was a long time ago" If all above answers are "NO", may proceed with cephalosporin use.   . Sulfa Antibiotics Hives  . Trileptal [Oxcarbazepine] Other (See Comments)    Reaction unknown to daughter  . Ultram [Tramadol] Other (See Comments)    Reaction unknown to daughter  . Welchol [Colesevelam] Other (See Comments)    Reaction unknown to daughter  . Latex Rash  . Tomato Rash    Consultations:  cardiology   Procedures/Studies: DG Chest 2 View  Result Date: 01/09/2020 CLINICAL DATA:  Chest pain EXAM: CHEST - 2 VIEW COMPARISON:  01/04/2020 FINDINGS: There are persistent but improved airspace opacities in the right upper lobe. The heart  size is stable. There is no pneumothorax. Again noted are findings of thyromegaly. There is no acute osseous abnormality. There are degenerative changes of both glenohumeral joints. IMPRESSION: Persistent but improved airspace opacities in the right upper lobe. Electronically Signed   By: Constance Holster M.D.   On: 01/09/2020 21:54   CT CHEST WO CONTRAST  Result Date: 01/04/2020 CLINICAL DATA:  Cough with weakness and tachycardia. Possible cavitary right upper lobe lesion on radiographs. EXAM: CT CHEST WITHOUT CONTRAST TECHNIQUE: Multidetector CT imaging of the chest was performed following the standard protocol without IV contrast. COMPARISON:  Radiographs today. CTA 03/12/2019. Thyroid ultrasound 06/02/2014. FINDINGS: Cardiovascular: Atherosclerosis of the aorta, great vessels and coronary arteries. Probable calcifications of the aortic valve. The heart size is normal. There is no pericardial effusion. Mediastinum/Nodes: There are no enlarged mediastinal, hilar or axillary lymph nodes.Hilar assessment is limited by the lack of intravenous contrast, although the hilar contours appear unchanged. Stable heterogeneous enlargement of  both thyroid lobes, right greater than left. This has been previously evaluated by ultrasound. The trachea and esophagus demonstrate no significant findings. Lungs/Pleura: There are new multifocal airspace opacities within the right upper lobe consistent with pneumonia. No evidence of cavitation or dominant mass. There is no significant pleural effusion or pneumothorax. Upper abdomen: The visualized upper abdomen appears stable without suspicious findings. There are probable small cysts in the upper poles of both kidneys. No adrenal mass. Musculoskeletal/Chest wall: There is no chest wall mass or suspicious osseous finding. Multilevel thoracic spondylosis with Schmorl's node formation in the superior endplate of Y10. Advanced bilateral shoulder arthropathy. IMPRESSION: 1. New  multifocal airspace opacities within the right upper lobe consistent with pneumonia. Radiographic follow-up recommended to document resolution. 2. No evidence of cavitation or dominant mass to suggest neoplasm. 3. Coronary andAortic Atherosclerosis (ICD10-I70.0). Electronically Signed   By: Richardean Sale M.D.   On: 01/04/2020 16:16   DG Chest Port 1 View  Result Date: 01/04/2020 CLINICAL DATA:  Weakness and tachycardia. EXAM: PORTABLE CHEST 1 VIEW COMPARISON:  CTA chest and chest x-ray dated March 12, 2019. FINDINGS: The heart size and mediastinal contours are within normal limits. Normal pulmonary vascularity. New 5.3 cm lesion in the right upper lobe with central lucency, suggestive of cavitation. No pleural effusion or pneumothorax. No acute osseous abnormality. IMPRESSION: 1. Suspected new 5.3 cm cavitary lesion in the right upper lobe. CT of the chest with contrast is recommended for further evaluation. Electronically Signed   By: Titus Dubin M.D.   On: 01/04/2020 13:45   ECHOCARDIOGRAM COMPLETE  Result Date: 01/10/2020    ECHOCARDIOGRAM REPORT   Patient Name:   [name] Date of Exam: 01/10/2020 Medical Rec #:  [id number]      Height:       63.0 in Accession #:    [id number]     Weight:       214.7 lb Date of Birth:  02-19-1924      BSA:          1.993 m Patient Age:    38 years       BP:           125/85 mmHg Patient Gender: F              HR:           86 bpm. Exam Location:  [name] Procedure: 2D Echo, Cardiac Doppler and Color Doppler Indications:    Atrial flutter  History:        Patient has prior history of Echocardiogram examinations, most                 recent 12/31/2018. Stroke, Arrythmias:Atrial Fibrillation; Risk                 Factors:Hypertension and Obesity.  Sonographer:    Dustin Flock RDCS Referring Phys: [id number] ASIA B [place]  1. Left ventricular ejection fraction, by estimation, is 60 to 65%. The left ventricle has normal function. The left  ventricle has no regional wall motion abnormalities. There is mild concentric left ventricular hypertrophy. Left ventricular diastolic parameters are consistent with Grade I diastolic dysfunction (impaired relaxation).  2. Right ventricular systolic function is normal. The right ventricular size is normal. There is normal pulmonary artery systolic pressure.  3. Left atrial size was mildly dilated.  4. The mitral valve is normal in structure. Trivial mitral valve regurgitation. No evidence of mitral stenosis.  5. The aortic valve  is normal in structure. Aortic valve regurgitation is not visualized. Mild aortic valve sclerosis is present, with no evidence of aortic valve stenosis. FINDINGS  Left Ventricle: Left ventricular ejection fraction, by estimation, is 60 to 65%. The left ventricle has normal function. The left ventricle has no regional wall motion abnormalities. The left ventricular internal cavity size was normal in size. There is  mild concentric left ventricular hypertrophy. Left ventricular diastolic parameters are consistent with Grade I diastolic dysfunction (impaired relaxation). Normal left ventricular filling pressure. Right Ventricle: The right ventricular size is normal. No increase in right ventricular wall thickness. Right ventricular systolic function is normal. There is normal pulmonary artery systolic pressure. The tricuspid regurgitant velocity is 2.82 m/s, and  with an assumed right atrial pressure of 3 mmHg, the estimated right ventricular systolic pressure is 96.2 mmHg. Left Atrium: Left atrial size was mildly dilated. Right Atrium: Right atrial size was normal in size. Pericardium: There is no evidence of pericardial effusion. Mitral Valve: The mitral valve is normal in structure. Trivial mitral valve regurgitation. No evidence of mitral valve stenosis. Tricuspid Valve: The tricuspid valve is normal in structure. Tricuspid valve regurgitation is not demonstrated. No evidence of tricuspid  stenosis. Aortic Valve: The aortic valve is normal in structure. Aortic valve regurgitation is not visualized. Mild aortic valve sclerosis is present, with no evidence of aortic valve stenosis. Pulmonic Valve: The pulmonic valve was normal in structure. Pulmonic valve regurgitation is not visualized. No evidence of pulmonic stenosis. Aorta: The aortic root is normal in size and structure. Venous: The inferior vena cava was not well visualized. IAS/Shunts: No atrial level shunt detected by color flow Doppler.  LEFT VENTRICLE PLAX 2D LVIDd:         4.31 cm  Diastology LVIDs:         2.92 cm  LV e' medial:    0.07 cm/s LV PW:         1.27 cm  LV E/e' medial:  7.0 LV IVS:        1.27 cm  LV e' lateral:   0.11 cm/s LVOT diam:     2.30 cm  LV E/e' lateral: 4.0 LV SV:         62 LV SV Index:   31 LVOT Area:     4.15 cm  RIGHT VENTRICLE RV Basal diam:  2.86 cm RV S prime:     14.30 cm/s TAPSE (M-mode): 2.6 cm LEFT ATRIUM             Index       RIGHT ATRIUM           Index LA diam:        3.90 cm 1.96 cm/m  RA Area:     16.30 cm LA Vol (A2C):   43.0 ml 21.58 ml/m RA Volume:   43.30 ml  21.73 ml/m LA Vol (A4C):   25.9 ml 13.00 ml/m LA Biplane Vol: 35.9 ml 18.01 ml/m  AORTIC VALVE LVOT Vmax:   81.60 cm/s LVOT Vmean:  53.300 cm/s LVOT VTI:    0.150 m  AORTA Ao Root diam: 2.80 cm MITRAL VALVE               TRICUSPID VALVE MV Area (PHT): 4.54 cm    TR Peak grad:   31.8 mmHg MV Decel Time: 167 msec    TR Vmax:        282.00 cm/s MV E velocity: 0.46 cm/s MV A velocity: 58.30 cm/s  SHUNTS MV E/A ratio:  0.01        Systemic VTI:  0.15 m                            Systemic Diam: 2.30 cm Mihai Croitoru MD Electronically signed by Sanda Klein MD Signature Date/Time: 01/10/2020/12:46:47 PM    Final         Discharge Exam: Vitals:   01/12/20 0545 01/12/20 0752  BP: 127/60   Pulse: 76   Resp: 20   Temp: 98.9 F (37.2 C)   SpO2: 98% 96%   Vitals:   01/11/20 1928 01/11/20 2132 01/12/20 0545 01/12/20 0752  BP:   139/73 127/60   Pulse:  74 76   Resp:  20 20   Temp:  98.4 F (36.9 C) 98.9 F (37.2 C)   TempSrc:  Oral Oral   SpO2: 92% 100% 98% 96%  Weight:      Height:        General: Pt is alert, awake, not in acute distress Cardiovascular: RRR, S1/S2 +, no rubs, no gallops Respiratory: bibasilar rales. No wheeze Abdominal: Soft, NT, ND, bowel sounds + Extremities: no edema, no cyanosis   The results of significant diagnostics from this hospitalization (including imaging, microbiology, ancillary and laboratory) are listed below for reference.    Significant Diagnostic Studies: DG Chest 2 View  Result Date: 01/09/2020 CLINICAL DATA:  Chest pain EXAM: CHEST - 2 VIEW COMPARISON:  01/04/2020 FINDINGS: There are persistent but improved airspace opacities in the right upper lobe. The heart size is stable. There is no pneumothorax. Again noted are findings of thyromegaly. There is no acute osseous abnormality. There are degenerative changes of both glenohumeral joints. IMPRESSION: Persistent but improved airspace opacities in the right upper lobe. Electronically Signed   By: Constance Holster M.D.   On: 01/09/2020 21:54   CT CHEST WO CONTRAST  Result Date: 01/04/2020 CLINICAL DATA:  Cough with weakness and tachycardia. Possible cavitary right upper lobe lesion on radiographs. EXAM: CT CHEST WITHOUT CONTRAST TECHNIQUE: Multidetector CT imaging of the chest was performed following the standard protocol without IV contrast. COMPARISON:  Radiographs today. CTA 03/12/2019. Thyroid ultrasound 06/02/2014. FINDINGS: Cardiovascular: Atherosclerosis of the aorta, great vessels and coronary arteries. Probable calcifications of the aortic valve. The heart size is normal. There is no pericardial effusion. Mediastinum/Nodes: There are no enlarged mediastinal, hilar or axillary lymph nodes.Hilar assessment is limited by the lack of intravenous contrast, although the hilar contours appear unchanged. Stable heterogeneous  enlargement of both thyroid lobes, right greater than left. This has been previously evaluated by ultrasound. The trachea and esophagus demonstrate no significant findings. Lungs/Pleura: There are new multifocal airspace opacities within the right upper lobe consistent with pneumonia. No evidence of cavitation or dominant mass. There is no significant pleural effusion or pneumothorax. Upper abdomen: The visualized upper abdomen appears stable without suspicious findings. There are probable small cysts in the upper poles of both kidneys. No adrenal mass. Musculoskeletal/Chest wall: There is no chest wall mass or suspicious osseous finding. Multilevel thoracic spondylosis with Schmorl's node formation in the superior endplate of V67. Advanced bilateral shoulder arthropathy. IMPRESSION: 1. New multifocal airspace opacities within the right upper lobe consistent with pneumonia. Radiographic follow-up recommended to document resolution. 2. No evidence of cavitation or dominant mass to suggest neoplasm. 3. Coronary andAortic Atherosclerosis (ICD10-I70.0). Electronically Signed   By: Richardean Sale M.D.   On: 01/04/2020 16:16   DG  Chest Port 1 View  Result Date: 01/04/2020 CLINICAL DATA:  Weakness and tachycardia. EXAM: PORTABLE CHEST 1 VIEW COMPARISON:  CTA chest and chest x-ray dated March 12, 2019. FINDINGS: The heart size and mediastinal contours are within normal limits. Normal pulmonary vascularity. New 5.3 cm lesion in the right upper lobe with central lucency, suggestive of cavitation. No pleural effusion or pneumothorax. No acute osseous abnormality. IMPRESSION: 1. Suspected new 5.3 cm cavitary lesion in the right upper lobe. CT of the chest with contrast is recommended for further evaluation. Electronically Signed   By: Titus Dubin M.D.   On: 01/04/2020 13:45   ECHOCARDIOGRAM COMPLETE  Result Date: 01/10/2020    ECHOCARDIOGRAM REPORT   Patient Name:   [name] Date of Exam: 01/10/2020 Medical  Rec #:  [id number]      Height:       63.0 in Accession #:    [id number]     Weight:       214.7 lb Date of Birth:  Jul 23, 1923      BSA:          1.993 m Patient Age:    14 years       BP:           125/85 mmHg Patient Gender: F              HR:           86 bpm. Exam Location:  [name] Procedure: 2D Echo, Cardiac Doppler and Color Doppler Indications:    Atrial flutter  History:        Patient has prior history of Echocardiogram examinations, most                 recent 12/31/2018. Stroke, Arrythmias:Atrial Fibrillation; Risk                 Factors:Hypertension and Obesity.  Sonographer:    Dustin Flock RDCS Referring Phys: [id number] ASIA B [place]  1. Left ventricular ejection fraction, by estimation, is 60 to 65%. The left ventricle has normal function. The left ventricle has no regional wall motion abnormalities. There is mild concentric left ventricular hypertrophy. Left ventricular diastolic parameters are consistent with Grade I diastolic dysfunction (impaired relaxation).  2. Right ventricular systolic function is normal. The right ventricular size is normal. There is normal pulmonary artery systolic pressure.  3. Left atrial size was mildly dilated.  4. The mitral valve is normal in structure. Trivial mitral valve regurgitation. No evidence of mitral stenosis.  5. The aortic valve is normal in structure. Aortic valve regurgitation is not visualized. Mild aortic valve sclerosis is present, with no evidence of aortic valve stenosis. FINDINGS  Left Ventricle: Left ventricular ejection fraction, by estimation, is 60 to 65%. The left ventricle has normal function. The left ventricle has no regional wall motion abnormalities. The left ventricular internal cavity size was normal in size. There is  mild concentric left ventricular hypertrophy. Left ventricular diastolic parameters are consistent with Grade I diastolic dysfunction (impaired relaxation). Normal left ventricular filling pressure.  Right Ventricle: The right ventricular size is normal. No increase in right ventricular wall thickness. Right ventricular systolic function is normal. There is normal pulmonary artery systolic pressure. The tricuspid regurgitant velocity is 2.82 m/s, and  with an assumed right atrial pressure of 3 mmHg, the estimated right ventricular systolic pressure is 93.2 mmHg. Left Atrium: Left atrial size was mildly dilated. Right Atrium: Right atrial size was normal in size.  Pericardium: There is no evidence of pericardial effusion. Mitral Valve: The mitral valve is normal in structure. Trivial mitral valve regurgitation. No evidence of mitral valve stenosis. Tricuspid Valve: The tricuspid valve is normal in structure. Tricuspid valve regurgitation is not demonstrated. No evidence of tricuspid stenosis. Aortic Valve: The aortic valve is normal in structure. Aortic valve regurgitation is not visualized. Mild aortic valve sclerosis is present, with no evidence of aortic valve stenosis. Pulmonic Valve: The pulmonic valve was normal in structure. Pulmonic valve regurgitation is not visualized. No evidence of pulmonic stenosis. Aorta: The aortic root is normal in size and structure. Venous: The inferior vena cava was not well visualized. IAS/Shunts: No atrial level shunt detected by color flow Doppler.  LEFT VENTRICLE PLAX 2D LVIDd:         4.31 cm  Diastology LVIDs:         2.92 cm  LV e' medial:    0.07 cm/s LV PW:         1.27 cm  LV E/e' medial:  7.0 LV IVS:        1.27 cm  LV e' lateral:   0.11 cm/s LVOT diam:     2.30 cm  LV E/e' lateral: 4.0 LV SV:         62 LV SV Index:   31 LVOT Area:     4.15 cm  RIGHT VENTRICLE RV Basal diam:  2.86 cm RV S prime:     14.30 cm/s TAPSE (M-mode): 2.6 cm LEFT ATRIUM             Index       RIGHT ATRIUM           Index LA diam:        3.90 cm 1.96 cm/m  RA Area:     16.30 cm LA Vol (A2C):   43.0 ml 21.58 ml/m RA Volume:   43.30 ml  21.73 ml/m LA Vol (A4C):   25.9 ml 13.00 ml/m LA  Biplane Vol: 35.9 ml 18.01 ml/m  AORTIC VALVE LVOT Vmax:   81.60 cm/s LVOT Vmean:  53.300 cm/s LVOT VTI:    0.150 m  AORTA Ao Root diam: 2.80 cm MITRAL VALVE               TRICUSPID VALVE MV Area (PHT): 4.54 cm    TR Peak grad:   31.8 mmHg MV Decel Time: 167 msec    TR Vmax:        282.00 cm/s MV E velocity: 0.46 cm/s MV A velocity: 58.30 cm/s  SHUNTS MV E/A ratio:  0.01        Systemic VTI:  0.15 m                            Systemic Diam: 2.30 cm Dani Gobble Croitoru MD Electronically signed by Sanda Klein MD Signature Date/Time: 01/10/2020/12:46:47 PM    Final      Microbiology: Recent Results (from the past 240 hour(s))  SARS Coronavirus 2 by RT PCR (hospital order, performed in [place] hospital lab) Nasopharyngeal Nasopharyngeal Swab     Status: None   Collection Time: 01/04/20  1:13 PM   Specimen: Nasopharyngeal Swab  Result Value Ref Range Status   SARS Coronavirus 2 NEGATIVE NEGATIVE Final    Comment: (NOTE) SARS-CoV-2 target nucleic acids are NOT DETECTED.  The SARS-CoV-2 RNA is generally detectable in upper and lower respiratory specimens during the acute phase  of infection. The lowest concentration of SARS-CoV-2 viral copies this assay can detect is 250 copies / mL. A negative result does not preclude SARS-CoV-2 infection and should not be used as the sole basis for treatment or other patient management decisions.  A negative result may occur with improper specimen collection / handling, submission of specimen other than nasopharyngeal swab, presence of viral mutation(s) within the areas targeted by this assay, and inadequate number of viral copies (<250 copies / mL). A negative result must be combined with clinical observations, patient history, and epidemiological information.  Fact Sheet for Patients:   StrictlyIdeas.no  Fact Sheet for Healthcare Providers: BankingDealers.co.za  This test is not yet approved or  cleared by  the [place] FDA and has been authorized for detection and/or diagnosis of SARS-CoV-2 by FDA under an Emergency Use Authorization (EUA).  This EUA will remain in effect (meaning this test can be used) for the duration of the COVID-19 declaration under Section 564(b)(1) of the Act, 21 U.S.C. section 360bbb-3(b)(1), unless the authorization is terminated or revoked sooner.  Performed at [facility], [address] [place], [place] [address]   Culture, blood (x 2)     Status: None   Collection Time: 01/04/20  7:07 PM   Specimen: Right Antecubital; Blood  Result Value Ref Range Status   Specimen Description RIGHT ANTECUBITAL  Final   Special Requests   Final    BOTTLES DRAWN AEROBIC AND ANAEROBIC Blood Culture adequate volume   Culture   Final    NO GROWTH 5 DAYS Performed at [facility], [address] [place], [place] [address]    Report Status 01/09/2020 FINAL  Final  Culture, blood (x 2)     Status: None   Collection Time: 01/04/20  7:28 PM   Specimen: BLOOD RIGHT FOREARM  Result Value Ref Range Status   Specimen Description BLOOD RIGHT FOREARM  Final   Special Requests   Final    BOTTLES DRAWN AEROBIC AND ANAEROBIC Blood Culture adequate volume   Culture   Final    NO GROWTH 5 DAYS Performed at [facility], [address] [place], [place] [address]    Report Status 01/09/2020 FINAL  Final  Respiratory Panel by RT PCR (Flu A&B, Covid) - Nasopharyngeal Swab     Status: None   Collection Time: 01/09/20 11:04 PM   Specimen: Nasopharyngeal Swab  Result Value Ref Range Status   SARS Coronavirus 2 by RT PCR NEGATIVE NEGATIVE Final    Comment: (NOTE) SARS-CoV-2 target nucleic acids are NOT DETECTED.  The SARS-CoV-2 RNA is generally detectable in upper respiratoy specimens during the acute phase of infection. The lowest concentration of SARS-CoV-2 viral copies this assay can detect is 131 copies/mL. A negative result does not preclude SARS-Cov-2 infection and should not  be used as the sole basis for treatment or other patient management decisions. A negative result may occur with  improper specimen collection/handling, submission of specimen other than nasopharyngeal swab, presence of viral mutation(s) within the areas targeted by this assay, and inadequate number of viral copies (<131 copies/mL). A negative result must be combined with clinical observations, patient history, and epidemiological information. The expected result is Negative.  Fact Sheet for Patients:  PinkCheek.be  Fact Sheet for Healthcare Providers:  GravelBags.it  This test is no t yet approved or cleared by the [place] FDA and  has been authorized for detection and/or diagnosis of SARS-CoV-2 by FDA under an Emergency Use Authorization (EUA). This EUA  will remain  in effect (meaning this test can be used) for the duration of the COVID-19 declaration under Section 564(b)(1) of the Act, 21 U.S.C. section 360bbb-3(b)(1), unless the authorization is terminated or revoked sooner.     Influenza A by PCR NEGATIVE NEGATIVE Final   Influenza B by PCR NEGATIVE NEGATIVE Final    Comment: (NOTE) The Xpert Xpress SARS-CoV-2/FLU/RSV assay is intended as an aid in  the diagnosis of influenza from Nasopharyngeal swab specimens and  should not be used as a sole basis for treatment. Nasal washings and  aspirates are unacceptable for Xpert Xpress SARS-CoV-2/FLU/RSV  testing.  Fact Sheet for Patients: PinkCheek.be  Fact Sheet for Healthcare Providers: GravelBags.it  This test is not yet approved or cleared by the [place] FDA and  has been authorized for detection and/or diagnosis of SARS-CoV-2 by  FDA under an Emergency Use Authorization (EUA). This EUA will remain  in effect (meaning this test can be used) for the duration of the  Covid-19 declaration under Section  564(b)(1) of the Act, 21  U.S.C. section 360bbb-3(b)(1), unless the authorization is  terminated or revoked. Performed at [facility], [address], [place], [place] [address]   Culture, blood (Routine X 2) w Reflex to ID Panel     Status: None (Preliminary result)   Collection Time: 01/10/20  9:47 AM   Specimen: BLOOD RIGHT HAND  Result Value Ref Range Status   Specimen Description   Final    BLOOD RIGHT HAND BOTTLES DRAWN AEROBIC AND ANAEROBIC   Special Requests Blood Culture adequate volume  Final   Culture   Final    NO GROWTH 1 DAY Performed at [facility], [address], [place], [place] [address]    Report Status PENDING  Incomplete  Culture, blood (Routine X 2) w Reflex to ID Panel     Status: None (Preliminary result)   Collection Time: 01/10/20  9:56 AM   Specimen: BLOOD LEFT HAND  Result Value Ref Range Status   Specimen Description BLOOD LEFT HAND BOTTLES DRAWN AEROBIC ONLY  Final   Special Requests   Final    Blood Culture results may not be optimal due to an inadequate volume of blood received in culture bottles   Culture   Final    NO GROWTH 1 DAY Performed at [facility], [address], [place], [place] [address]    Report Status PENDING  Incomplete     Labs: Basic Metabolic Panel: Recent Labs  Lab 01/06/20 0356 01/06/20 0356 01/07/20 0608 01/07/20 0608 01/09/20 2104 01/09/20 2104 01/10/20 0608 01/11/20 0850  NA 138  --  139  --  139  --  138 140  K 4.0   < > 4.2   < > 3.6   < > 3.8 4.3  CL 100  --  104  --  102  --  103 104  CO2 25  --  25  --  23  --  23 27  GLUCOSE 89  --  82  --  104*  --  109* 99  BUN 26*  --  28*  --  26*  --  24* 25*  CREATININE 1.38*  --  1.44*  --  1.36*  --  1.25* 1.53*  CALCIUM 9.6  --  9.0  --  10.3  --  9.8 9.3  MG  --   --  1.8  --   --   --  1.5* 1.8   < > =  values in this interval not displayed.   Liver Function Tests: Recent Labs  Lab 01/10/20 0608  AST 26  ALT 18  ALKPHOS 70  BILITOT 0.5  PROT 6.8    ALBUMIN 2.7*   No results for input(s): LIPASE, AMYLASE in the last 168 hours. No results for input(s): AMMONIA in the last 168 hours. CBC: Recent Labs  Lab 01/06/20 0356 01/07/20 0608 01/09/20 2104 01/10/20 0608 01/11/20 0850  WBC 18.3* 14.3* 17.3* 15.9* 16.0*  HGB 10.1* 8.9* 10.3* 9.5* 8.9*  HCT 32.5* 29.0* 31.3* 29.9* 28.4*  MCV 75.1* 74.9* 71.8* 73.1* 73.6*  PLT 791* 776* 973* 856* 777*   Cardiac Enzymes: No results for input(s): CKTOTAL, CKMB, CKMBINDEX, TROPONINI in the last 168 hours. BNP: Invalid input(s): POCBNP CBG: Recent Labs  Lab 01/09/20 2101  GLUCAP 96    Time coordinating discharge:  36 minutes  Signed:  Orson Eva, DO [facility] Pager: [phone] 01/12/2020, 8:25 AM

## 2020-01-11 NOTE — Evaluation (Signed)
Physical Therapy Evaluation Patient Details Name: [name] MRN: [id number] DOB: 07/22/1923 Today's Date: 01/11/2020   History of Present Illness  [name]  is a 86 y.o. female, with history of stroke, seizures, neurogenic claudication, hypertension, eczema, degenerative arthritis, and more presents to the ER via EMS.  Apparently patient was complaining of chest tightness and needing help when EMS picked her up.  At the time of my exam she states she does not know why she is here she reports that she does not done anything to no one, and she was just trying to get some food, she has not had anything to eat, she does not know what she is done to deserve this.  Patient reports that she is not having chest pain at this time.  She reports no pain.  She does report that she is cold.  Patient is not able to provide more history.    Clinical Impression  Patient demonstrates labored movement for bed mobility requiring Min assist to move BLE when getting back in to bed, slightly labored cadence during ambulation without loss of balance, limited secondary to fatigue and an episode of incontinence of urine.  Patient tolerated sitting up at bedside to eat breakfast after therapy - nursing staff aware.  Patient will benefit from continued physical therapy in hospital and recommended venue below to increase strength, balance, endurance for safe ADLs and gait.    Follow Up Recommendations SNF;Supervision/Assistance - 24 hour;Supervision for mobility/OOB    Equipment Recommendations  None recommended by PT    Recommendations for Other Services       Precautions / Restrictions Precautions Precautions: Fall Restrictions Weight Bearing Restrictions: No      Mobility  Bed Mobility Overal bed mobility: Needs Assistance Bed Mobility: Supine to Sit;Sit to Supine     Supine to sit: Supervision;Min guard Sit to supine: Min assist   General bed mobility comments: increased time, labored movement,  required Min assist to move legs when getting back in bed  Transfers Overall transfer level: Needs assistance Equipment used: Rolling walker (2 wheeled) Transfers: Sit to/from [facility] Sit to Stand: Min guard Stand pivot transfers: Min assist       General transfer comment: increased time labored movement  Ambulation/Gait Ambulation/Gait assistance: Min guard;Min assist Gait Distance (Feet): 65 Feet Assistive device: Rolling walker (2 wheeled) Gait Pattern/deviations: Decreased step length - right;Decreased step length - left;Decreased stride length;Trunk flexed Gait velocity: decreased   General Gait Details: slightly labored cadence without loss of balance, limited secondary to fatigue, episode of incontinent of urine  Stairs            Wheelchair Mobility    Modified Rankin (Stroke Patients Only)       Balance Overall balance assessment: Needs assistance Sitting-balance support: Feet supported;No upper extremity supported Sitting balance-Leahy Scale: Good Sitting balance - Comments: seated at EOB   Standing balance support: No upper extremity supported;During functional activity Standing balance-Leahy Scale: Poor Standing balance comment: fair using RW                             Pertinent Vitals/Pain Pain Assessment: No/denies pain    Home Living Family/patient expects to be discharged to:: Private residence Living Arrangements: Alone Available Help at Discharge: Family;Available PRN/intermittently Type of Home: House Home Access: Stairs to enter Entrance Stairs-Rails: Right Entrance Stairs-Number of Steps: 5-6 Home Layout: One level Home Equipment: Bedside commode;Walker - 2 wheels;Walker -  4 wheels;Cane - single point;Shower seat      Prior Function Level of Independence: Needs assistance   Gait / Transfers Assistance Needed: Household ambulator using SPC, occasional RW for longer distances  ADL's / Homemaking  Assistance Needed: Daughter assists with tub transfers and washing difficult to reach areas, neighbor checks on patient daily        Hand Dominance   Dominant Hand: Right    Extremity/Trunk Assessment   Upper Extremity Assessment Upper Extremity Assessment: Generalized weakness    Lower Extremity Assessment Lower Extremity Assessment: Generalized weakness    Cervical / Trunk Assessment Cervical / Trunk Assessment: Normal  Communication   Communication: No difficulties  Cognition Arousal/Alertness: Awake/alert Behavior During Therapy: WFL for tasks assessed/performed Overall Cognitive Status: Within Functional Limits for tasks assessed                                        General Comments      Exercises     Assessment/Plan    PT Assessment Patient needs continued PT services  PT Problem List Decreased strength;Decreased activity tolerance;Decreased balance;Decreased mobility       PT Treatment Interventions DME instruction;Gait training;Stair training;Functional mobility training;Therapeutic activities;Therapeutic exercise;Patient/family education    PT Goals (Current goals can be found in the Care Plan section)  Acute Rehab PT Goals Patient Stated Goal: return home with family and neighbors to assist PT Goal Formulation: With patient Time For Goal Achievement: 01/25/20 Potential to Achieve Goals: Good    Frequency Min 3X/week   Barriers to discharge        Co-evaluation               AM-PAC PT "6 Clicks" Mobility  Outcome Measure Help needed turning from your back to your side while in a flat bed without using bedrails?: A Little Help needed moving from lying on your back to sitting on the side of a flat bed without using bedrails?: A Little Help needed moving to and from a bed to a chair (including a wheelchair)?: A Little Help needed standing up from a chair using your arms (e.g., wheelchair or bedside chair)?: A Little Help  needed to walk in hospital room?: A Little Help needed climbing 3-5 steps with a railing? : A Lot 6 Click Score: 17    End of Session   Activity Tolerance: Patient tolerated treatment well;Patient limited by fatigue Patient left: in bed;with call bell/phone within reach Nurse Communication: Mobility status PT Visit Diagnosis: Unsteadiness on feet (R26.81);Other abnormalities of gait and mobility (R26.89);Muscle weakness (generalized) (M62.81)    Time: 5035-4656 PT Time Calculation (min) (ACUTE ONLY): 26 min   Charges:   PT Evaluation $PT Eval Moderate Complexity: 1 Mod PT Treatments $Therapeutic Activity: 23-37 mins        10:08 AM, 01/11/20 Lonell Grandchild, MPT Physical Therapist with [facility] [phone] office [phone] mobile phone

## 2020-01-11 NOTE — Discharge Instructions (Signed)

## 2020-01-11 NOTE — Progress Notes (Signed)
PROGRESS NOTE  AYUSHI PLA CWU:[id number] DOB: 12-30-1923 DOA: 01/09/2020 PCP: [name], [name]   Brief History:  86 year old female with a history of hypertension, hyperlipidemia, stroke, seizure disorder, cognitive impairment presenting with chest pain and shortness of breath.  Unfortunately, the patient is a poor historian secondary to her cognitive impairment.  History is obtained from review of the medical record and speaking with the patient's daughter.  Notably, the patient was recently discharged from the hospital after a stay from 01/04/2020 to 01/07/2020 during which she was treated for sepsis secondary to pneumonia.  She was discharged home with cefdinir for 4 additional days.  During the hospitalization, she had an episode of SVT for which her metoprolol tartrate was increased to 75 mg twice daily.  She was discharged home with home health physical therapy.  Apparently, the patient's daughter stated that the patient was "normal" on 01/08/2020.  However, on 01/09/2020, the patient called her daughter stating that she was short of breath and coughing.  Therefore, the patient's neighbor was asked to come by to check up on her.  Apparently the patient has been having loose stools since arrival back to the home.  The patient herself denies any abdominal pain nausea, vomiting.  History has been difficult secondary to the patient's tangential speech.  Apparently EMS was activated at which time the patient had been complaining of some chest pain.  She was noted to be tachycardic. In the emergency department, the patient was afebrile hemodynamically stable with oxygen saturation 94-95% room air.  She was noted to have atrial fibrillation with RVR.  She was started on diltiazem drip and IV heparin.  Chest x-ray showed improving right upper lobe opacity.  BMP was essentially unremarkable with her serum creatinine at baseline.  LFTs were unremarkable.  WBC 17.3, hemoglobin 10.3, platelets  173,000.  Troponin 21>>25.  Assessment/Plan: Atrial fibrillation with RVR, type unspecified -spontaneously converted to sinus AM 9/26 -transition to po diltiazem>>dilitiazem CD -CHADS-VASc = 7 -Discontinue IV heparin -Started apixaban initially, but discontinued after speaking with daughter-->frequent falls -start ASA -Echo--EF 60-65%, no WMA, G1DD -Continue metoprolol at lower dose--doubt she has been taking properly at home -TSH 1.459 -appreciate cardiology consult  Elevated troponin -Secondary to demand ischemia -Personally reviewed EKG--atrial fibrillation, nonspecific ST changes -Personally reviewed chest x-ray--including right upper lobe opacity; no edema -Echocardiogram as above  Leukocytosis/Thrombocytosis -Suspect the patient may have myelodysplastic syndrome given her concomitant significant elevation in platelets -Certainly, the patient may have an infectious process versus stress demargination -Blood cultures x2 sets--neg to date -UA negative for pyuria -Finish course of cefdinir -PCT 0.19 -Stool for C. difficile given history of loose stools at home -check iron sat--27%, ferritin 336 -may need outpatient hematology follow up  CKD stage IIIb -Baseline creatinine 1.3-1.5 -A.m. BMP  Seizure disorder -Doubt patient has been taking her Depakote or Keppra properly -This was confirmed after speaking with the patient's daughter -VPA level undetectable -Restart Keppra and VPA  Asthma -continue pulmicort -continue singulair  Hypomagnesemia -replete  Morbid Obesity -BMI--38.04 -Lifestyle modification  GERD    Status is: Inpatient  Remains inpatient appropriate because:IV treatments appropriate due to intensity of illness or inability to take PO   Dispo: The patient is from: Home  Anticipated d/c is to: SNF  Anticipated d/c date is: 9/28  Patient currently is not medically stable to  d/c.        Family Communication:   Updated daughter 9/26  Consultants:  none  Code Status:  FULL  DVT Prophylaxis:  apixaban   Procedures: As Listed in Progress Note Above  Antibiotics: cefdinir    Subjective: Patient denies fevers, chills, headache, chest pain, dyspnea, nausea, vomiting, diarrhea, abdominal pain, dysuria,    Objective: Vitals:   01/11/20 0730 01/11/20 0745 01/11/20 0814 01/11/20 1052  BP: (!) 127/50   121/60  Pulse: 63 63  72  Resp: 15 16  20   Temp:    98.7 F (37.1 C)  TempSrc:    Oral  SpO2: 92% 94% 91% 98%  Weight:    97.9 kg  Height:    5\' 4"  (1.626 m)   No intake or output data in the 24 hours ending 01/11/20 1713 Weight change:  Exam:   General:  Pt is alert, follows commands appropriately, not in acute distress  HEENT: No icterus, No thrush, No neck mass, [place]/AT  Cardiovascular: RRR, S1/S2, no rubs, no gallops  Respiratory: bibasilar rales. No wheeze  Abdomen: Soft/+BS, non tender, non distended, no guarding  Extremities: No edema, No lymphangitis, No petechiae, No rashes, no synovitis   Data Reviewed: I have personally reviewed following labs and imaging studies Basic Metabolic Panel: Recent Labs  Lab 01/06/20 0356 01/07/20 0608 01/09/20 2104 01/10/20 0608 01/11/20 0850  NA 138 139 139 138 140  K 4.0 4.2 3.6 3.8 4.3  CL 100 104 102 103 104  CO2 25 25 23 23 27   GLUCOSE 89 82 104* 109* 99  BUN 26* 28* 26* 24* 25*  CREATININE 1.38* 1.44* 1.36* 1.25* 1.53*  CALCIUM 9.6 9.0 10.3 9.8 9.3  MG  --  1.8  --  1.5* 1.8   Liver Function Tests: Recent Labs  Lab 01/05/20 0130 01/10/20 0608  AST 23 26  ALT 18 18  ALKPHOS 90 70  BILITOT 0.3 0.5  PROT 7.2 6.8  ALBUMIN 2.6* 2.7*   No results for input(s): LIPASE, AMYLASE in the last 168 hours. No results for input(s): AMMONIA in the last 168 hours. Coagulation Profile: Recent Labs  Lab 01/05/20 0130  INR 1.3*   CBC: Recent Labs  Lab  01/05/20 0130 01/05/20 0622 01/06/20 0356 01/07/20 4166 01/09/20 2104 01/10/20 0608 01/11/20 0850  WBC 18.8*   < > 18.3* 14.3* 17.3* 15.9* 16.0*  NEUTROABS 15.3*  --   --   --   --   --   --   HGB 8.9*   < > 10.1* 8.9* 10.3* 9.5* 8.9*  HCT 28.2*   < > 32.5* 29.0* 31.3* 29.9* 28.4*  MCV 75.2*   < > 75.1* 74.9* 71.8* 73.1* 73.6*  PLT 826*   < > 791* 776* 973* 856* 777*   < > = values in this interval not displayed.   Cardiac Enzymes: No results for input(s): CKTOTAL, CKMB, CKMBINDEX, TROPONINI in the last 168 hours. BNP: Invalid input(s): POCBNP CBG: Recent Labs  Lab 01/09/20 2101  GLUCAP 96   HbA1C: No results for input(s): HGBA1C in the last 72 hours. Urine analysis:    Component Value Date/Time   COLORURINE YELLOW 01/10/2020 0109   APPEARANCEUR CLEAR 01/10/2020 0109   LABSPEC 1.011 01/10/2020 0109   PHURINE 6.0 01/10/2020 0109   GLUCOSEU NEGATIVE 01/10/2020 0109   HGBUR NEGATIVE 01/10/2020 0109   BILIRUBINUR NEGATIVE 01/10/2020 0109   BILIRUBINUR neg 03/09/2015 1652   KETONESUR 5 (A) 01/10/2020 0109   PROTEINUR NEGATIVE 01/10/2020 0109   UROBILINOGEN negative 03/09/2015 1652   NITRITE NEGATIVE 01/10/2020 0109  LEUKOCYTESUR TRACE (A) 01/10/2020 0109   Sepsis Labs: @LABRCNTIP (procalcitonin:4,lacticidven:4) ) Recent Results (from the past 240 hour(s))  SARS Coronavirus 2 by RT PCR (hospital order, performed in [facility] hospital lab) Nasopharyngeal Nasopharyngeal Swab     Status: None   Collection Time: 01/04/20  1:13 PM   Specimen: Nasopharyngeal Swab  Result Value Ref Range Status   SARS Coronavirus 2 NEGATIVE NEGATIVE Final    Comment: (NOTE) SARS-CoV-2 target nucleic acids are NOT DETECTED.  The SARS-CoV-2 RNA is generally detectable in upper and lower respiratory specimens during the acute phase of infection. The lowest concentration of SARS-CoV-2 viral copies this assay can detect is 250 copies / mL. A negative result does not preclude SARS-CoV-2  infection and should not be used as the sole basis for treatment or other patient management decisions.  A negative result may occur with improper specimen collection / handling, submission of specimen other than nasopharyngeal swab, presence of viral mutation(s) within the areas targeted by this assay, and inadequate number of viral copies (<250 copies / mL). A negative result must be combined with clinical observations, patient history, and epidemiological information.  Fact Sheet for Patients:   StrictlyIdeas.no  Fact Sheet for Healthcare Providers: BankingDealers.co.za  This test is not yet approved or  cleared by the [place] FDA and has been authorized for detection and/or diagnosis of SARS-CoV-2 by FDA under an Emergency Use Authorization (EUA).  This EUA will remain in effect (meaning this test can be used) for the duration of the COVID-19 declaration under Section 564(b)(1) of the Act, 21 U.S.C. section 360bbb-3(b)(1), unless the authorization is terminated or revoked sooner.  Performed at [facility], [address], [place], [place] [address]   Culture, blood (x 2)     Status: None   Collection Time: 01/04/20  7:07 PM   Specimen: Right Antecubital; Blood  Result Value Ref Range Status   Specimen Description RIGHT ANTECUBITAL  Final   Special Requests   Final    BOTTLES DRAWN AEROBIC AND ANAEROBIC Blood Culture adequate volume   Culture   Final    NO GROWTH 5 DAYS Performed at [facility], [address], [place], [place] [address]    Report Status 01/09/2020 FINAL  Final  Culture, blood (x 2)     Status: None   Collection Time: 01/04/20  7:28 PM   Specimen: BLOOD RIGHT FOREARM  Result Value Ref Range Status   Specimen Description BLOOD RIGHT FOREARM  Final   Special Requests   Final    BOTTLES DRAWN AEROBIC AND ANAEROBIC Blood Culture adequate volume   Culture   Final    NO GROWTH 5 DAYS Performed at [facility], [address], [place], [place] [address]    Report Status 01/09/2020 FINAL  Final  Respiratory Panel by RT PCR (Flu A&B, Covid) - Nasopharyngeal Swab     Status: None   Collection Time: 01/09/20 11:04 PM   Specimen: Nasopharyngeal Swab  Result Value Ref Range Status   SARS Coronavirus 2 by RT PCR NEGATIVE NEGATIVE Final    Comment: (NOTE) SARS-CoV-2 target nucleic acids are NOT DETECTED.  The SARS-CoV-2 RNA is generally detectable in upper respiratoy specimens during the acute phase of infection. The lowest concentration of SARS-CoV-2 viral copies this assay can detect is 131 copies/mL. A negative result does not preclude SARS-Cov-2 infection and should not be used as the sole basis for treatment or other patient management decisions. A negative result may occur with  improper  specimen collection/handling, submission of specimen other than nasopharyngeal swab, presence of viral mutation(s) within the areas targeted by this assay, and inadequate number of viral copies (<131 copies/mL). A negative result must be combined with clinical observations, patient history, and epidemiological information. The expected result is Negative.  Fact Sheet for Patients:  PinkCheek.be  Fact Sheet for Healthcare Providers:  GravelBags.it  This test is no t yet approved or cleared by the [place] FDA and  has been authorized for detection and/or diagnosis of SARS-CoV-2 by FDA under an Emergency Use Authorization (EUA). This EUA will remain  in effect (meaning this test can be used) for the duration of the COVID-19 declaration under Section 564(b)(1) of the Act, 21 U.S.C. section 360bbb-3(b)(1), unless the authorization is terminated or revoked sooner.     Influenza A by PCR NEGATIVE NEGATIVE Final   Influenza B by PCR NEGATIVE NEGATIVE Final    Comment: (NOTE) The Xpert Xpress SARS-CoV-2/FLU/RSV assay is intended as an aid  in  the diagnosis of influenza from Nasopharyngeal swab specimens and  should not be used as a sole basis for treatment. Nasal washings and  aspirates are unacceptable for Xpert Xpress SARS-CoV-2/FLU/RSV  testing.  Fact Sheet for Patients: PinkCheek.be  Fact Sheet for Healthcare Providers: GravelBags.it  This test is not yet approved or cleared by the [place] FDA and  has been authorized for detection and/or diagnosis of SARS-CoV-2 by  FDA under an Emergency Use Authorization (EUA). This EUA will remain  in effect (meaning this test can be used) for the duration of the  Covid-19 declaration under Section 564(b)(1) of the Act, 21  U.S.C. section 360bbb-3(b)(1), unless the authorization is  terminated or revoked. Performed at [facility], [address] [place], [place] [address]   Culture, blood (Routine X 2) w Reflex to ID Panel     Status: None (Preliminary result)   Collection Time: 01/10/20  9:47 AM   Specimen: BLOOD RIGHT HAND  Result Value Ref Range Status   Specimen Description   Final    BLOOD RIGHT HAND BOTTLES DRAWN AEROBIC AND ANAEROBIC   Special Requests Blood Culture adequate volume  Final   Culture   Final    NO GROWTH 1 DAY Performed at [facility], [address], [place], [place] [address]    Report Status PENDING  Incomplete  Culture, blood (Routine X 2) w Reflex to ID Panel     Status: None (Preliminary result)   Collection Time: 01/10/20  9:56 AM   Specimen: BLOOD LEFT HAND  Result Value Ref Range Status   Specimen Description BLOOD LEFT HAND BOTTLES DRAWN AEROBIC ONLY  Final   Special Requests   Final    Blood Culture results may not be optimal due to an inadequate volume of blood received in culture bottles   Culture   Final    NO GROWTH 1 DAY Performed at [facility], [address] [place], [place] [address]    Report Status PENDING  Incomplete     Scheduled Meds: . budesonide  2 mL  Inhalation BID  . [START ON 01/12/2020] cefdinir  300 mg Oral Daily  . diltiazem  120 mg Oral Daily  . divalproex  500 mg Oral BID  . heparin  5,000 Units Subcutaneous Q8H  . levETIRAcetam  2,000 mg Oral QHS  . metolazone  2.5 mg Oral Q M,W,F  . metoprolol tartrate  25 mg Oral BID  . montelukast  10 mg Oral QHS  .  multivitamin with minerals  1 tablet Oral Q breakfast  . pantoprazole  40 mg Oral Daily   Continuous Infusions:  Procedures/Studies: DG Chest 2 View  Result Date: 01/09/2020 CLINICAL DATA:  Chest pain EXAM: CHEST - 2 VIEW COMPARISON:  01/04/2020 FINDINGS: There are persistent but improved airspace opacities in the right upper lobe. The heart size is stable. There is no pneumothorax. Again noted are findings of thyromegaly. There is no acute osseous abnormality. There are degenerative changes of both glenohumeral joints. IMPRESSION: Persistent but improved airspace opacities in the right upper lobe. Electronically Signed   By: Constance Holster M.D.   On: 01/09/2020 21:54   CT CHEST WO CONTRAST  Result Date: 01/04/2020 CLINICAL DATA:  Cough with weakness and tachycardia. Possible cavitary right upper lobe lesion on radiographs. EXAM: CT CHEST WITHOUT CONTRAST TECHNIQUE: Multidetector CT imaging of the chest was performed following the standard protocol without IV contrast. COMPARISON:  Radiographs today. CTA 03/12/2019. Thyroid ultrasound 06/02/2014. FINDINGS: Cardiovascular: Atherosclerosis of the aorta, great vessels and coronary arteries. Probable calcifications of the aortic valve. The heart size is normal. There is no pericardial effusion. Mediastinum/Nodes: There are no enlarged mediastinal, hilar or axillary lymph nodes.Hilar assessment is limited by the lack of intravenous contrast, although the hilar contours appear unchanged. Stable heterogeneous enlargement of both thyroid lobes, right greater than left. This has been previously evaluated by ultrasound. The trachea and  esophagus demonstrate no significant findings. Lungs/Pleura: There are new multifocal airspace opacities within the right upper lobe consistent with pneumonia. No evidence of cavitation or dominant mass. There is no significant pleural effusion or pneumothorax. Upper abdomen: The visualized upper abdomen appears stable without suspicious findings. There are probable small cysts in the upper poles of both kidneys. No adrenal mass. Musculoskeletal/Chest wall: There is no chest wall mass or suspicious osseous finding. Multilevel thoracic spondylosis with Schmorl's node formation in the superior endplate of V25. Advanced bilateral shoulder arthropathy. IMPRESSION: 1. New multifocal airspace opacities within the right upper lobe consistent with pneumonia. Radiographic follow-up recommended to document resolution. 2. No evidence of cavitation or dominant mass to suggest neoplasm. 3. Coronary andAortic Atherosclerosis (ICD10-I70.0). Electronically Signed   By: Richardean Sale M.D.   On: 01/04/2020 16:16   DG Chest Port 1 View  Result Date: 01/04/2020 CLINICAL DATA:  Weakness and tachycardia. EXAM: PORTABLE CHEST 1 VIEW COMPARISON:  CTA chest and chest x-ray dated March 12, 2019. FINDINGS: The heart size and mediastinal contours are within normal limits. Normal pulmonary vascularity. New 5.3 cm lesion in the right upper lobe with central lucency, suggestive of cavitation. No pleural effusion or pneumothorax. No acute osseous abnormality. IMPRESSION: 1. Suspected new 5.3 cm cavitary lesion in the right upper lobe. CT of the chest with contrast is recommended for further evaluation. Electronically Signed   By: Titus Dubin M.D.   On: 01/04/2020 13:45   ECHOCARDIOGRAM COMPLETE  Result Date: 01/10/2020    ECHOCARDIOGRAM REPORT   Patient Name:   ELYSHIA KUMAGAI Date of Exam: 01/10/2020 Medical Rec #:  [id number]      Height:       63.0 in Accession #:    [id number]     Weight:       214.7 lb Date of Birth:  Aug 28, 1923       BSA:          1.993 m Patient Age:    28 years       BP:  125/85 mmHg Patient Gender: F              HR:           86 bpm. Exam Location:  [name] Procedure: 2D Echo, Cardiac Doppler and Color Doppler Indications:    Atrial flutter  History:        Patient has prior history of Echocardiogram examinations, most                 recent 12/31/2018. Stroke, Arrythmias:Atrial Fibrillation; Risk                 Factors:Hypertension and Obesity.  Sonographer:    Dustin Flock RDCS Referring Phys: [id number] ASIA B [place]  1. Left ventricular ejection fraction, by estimation, is 60 to 65%. The left ventricle has normal function. The left ventricle has no regional wall motion abnormalities. There is mild concentric left ventricular hypertrophy. Left ventricular diastolic parameters are consistent with Grade I diastolic dysfunction (impaired relaxation).  2. Right ventricular systolic function is normal. The right ventricular size is normal. There is normal pulmonary artery systolic pressure.  3. Left atrial size was mildly dilated.  4. The mitral valve is normal in structure. Trivial mitral valve regurgitation. No evidence of mitral stenosis.  5. The aortic valve is normal in structure. Aortic valve regurgitation is not visualized. Mild aortic valve sclerosis is present, with no evidence of aortic valve stenosis. FINDINGS  Left Ventricle: Left ventricular ejection fraction, by estimation, is 60 to 65%. The left ventricle has normal function. The left ventricle has no regional wall motion abnormalities. The left ventricular internal cavity size was normal in size. There is  mild concentric left ventricular hypertrophy. Left ventricular diastolic parameters are consistent with Grade I diastolic dysfunction (impaired relaxation). Normal left ventricular filling pressure. Right Ventricle: The right ventricular size is normal. No increase in right ventricular wall thickness. Right ventricular  systolic function is normal. There is normal pulmonary artery systolic pressure. The tricuspid regurgitant velocity is 2.82 m/s, and  with an assumed right atrial pressure of 3 mmHg, the estimated right ventricular systolic pressure is 51.7 mmHg. Left Atrium: Left atrial size was mildly dilated. Right Atrium: Right atrial size was normal in size. Pericardium: There is no evidence of pericardial effusion. Mitral Valve: The mitral valve is normal in structure. Trivial mitral valve regurgitation. No evidence of mitral valve stenosis. Tricuspid Valve: The tricuspid valve is normal in structure. Tricuspid valve regurgitation is not demonstrated. No evidence of tricuspid stenosis. Aortic Valve: The aortic valve is normal in structure. Aortic valve regurgitation is not visualized. Mild aortic valve sclerosis is present, with no evidence of aortic valve stenosis. Pulmonic Valve: The pulmonic valve was normal in structure. Pulmonic valve regurgitation is not visualized. No evidence of pulmonic stenosis. Aorta: The aortic root is normal in size and structure. Venous: The inferior vena cava was not well visualized. IAS/Shunts: No atrial level shunt detected by color flow Doppler.  LEFT VENTRICLE PLAX 2D LVIDd:         4.31 cm  Diastology LVIDs:         2.92 cm  LV e' medial:    0.07 cm/s LV PW:         1.27 cm  LV E/e' medial:  7.0 LV IVS:        1.27 cm  LV e' lateral:   0.11 cm/s LVOT diam:     2.30 cm  LV E/e' lateral: 4.0 LV SV:  62 LV SV Index:   31 LVOT Area:     4.15 cm  RIGHT VENTRICLE RV Basal diam:  2.86 cm RV S prime:     14.30 cm/s TAPSE (M-mode): 2.6 cm LEFT ATRIUM             Index       RIGHT ATRIUM           Index LA diam:        3.90 cm 1.96 cm/m  RA Area:     16.30 cm LA Vol (A2C):   43.0 ml 21.58 ml/m RA Volume:   43.30 ml  21.73 ml/m LA Vol (A4C):   25.9 ml 13.00 ml/m LA Biplane Vol: 35.9 ml 18.01 ml/m  AORTIC VALVE LVOT Vmax:   81.60 cm/s LVOT Vmean:  53.300 cm/s LVOT VTI:    0.150 m  AORTA  Ao Root diam: 2.80 cm MITRAL VALVE               TRICUSPID VALVE MV Area (PHT): 4.54 cm    TR Peak grad:   31.8 mmHg MV Decel Time: 167 msec    TR Vmax:        282.00 cm/s MV E velocity: 0.46 cm/s MV A velocity: 58.30 cm/s  SHUNTS MV E/A ratio:  0.01        Systemic VTI:  0.15 m                            Systemic Diam: 2.30 cm Dani Gobble Croitoru MD Electronically signed by Sanda Klein MD Signature Date/Time: 01/10/2020/12:46:47 PM    Final     Orson Eva, DO  [facility]  If 7PM-7AM, please contact night-coverage [email or web address] Password [email or web address] 01/11/2020, 5:13 PM   LOS: 2 days

## 2020-01-11 NOTE — TOC Progression Note (Signed)
Transition of Care [facility]) - Progression Note    Patient Details  Name: [name] MRN: [id number] Date of Birth: 11/15/1923  Transition of Care [facility]) CM/SW Contact  Natasha Bence, LCSW Phone Number: 01/11/2020, 3:52 PM  Clinical Narrative:    CSW spoke with patient about PT eval for SNF. Patient reported that she would be agreeable to [facility] SNF's if her daughter would also be agreeable to SNF. Patient reported that she would be more comfortable with SNF that would be in close proximity to her daughter in [place]. CSW spoke with patient's daughter. Patient's daughter reported that she would be agreeable with SNF with her mother also being agreeable. CSW placed referral to [facility], completed FL2, and started [place]. TOC to follow.      Barriers to Discharge: Continued Medical Work up  Expected Discharge Plan and Services                                                 Social Determinants of Health (SDOH) Interventions    Readmission Risk Interventions Readmission Risk Prevention Plan 01/07/2020  Transportation Screening Complete  PCP or Specialist Appt within 5-7 Days Complete  Home Care Screening Complete  Medication Review (RN CM) Complete  Some recent data might be hidden

## 2020-01-11 NOTE — Consult Note (Addendum)
Cardiology Consultation:   Patient ID: [name] MRN: [id number]; DOB: July 19, 1923  Admit date: 01/09/2020 Date of Consult: 01/11/2020  Primary Care Provider: [name], [name] [facility] Cardiologist: Fransico Him, MD  [facility] Electrophysiologist:  None     Patient Profile:   [name] is a 86 y.o. female with a hx of HTN, HLD, CVA, seizures, CKD stage IV, chronic LEE, who is being seen today for the evaluation of Afib at the request of Dr. Carles Collet.  History of Present Illness:   [name] is a 86 yo female with hx HTN , HLD, CVA, seizures, obesity. Saw Dr. Radford Pax last 2019 and told to f/u prn. Had chest pain with myoview no ischemia, echo grade 1 DD, normal LVEF 55-60%.  Discharged 9/23 with sepsis pneumonia and notes indicate SVT and metoprolol increased to 75 mg bid. Cognitive impairment and difficult history.Complained of shortness of breath and coughing so brought back to ER Afib with RVR converted to NSR. CHADSVASC=7 and started on Eliquis and diltiazem. Notes indicate a stability disorder and she has had some falls but very difficult historian. Lives by herself and has a Industrial/product designer and daughter check on her. Says she uses a breathing machine-looks like pulmicort nebulizer. In ER hallway and not on telemetry.  Past Medical History:  Diagnosis Date  . Chronic low back pain   . Degenerative arthritis   . Eczema   . Edema   . Gout   . Headache   . Hiatal hernia   . Hypertension   . Internal hemorrhoids   . Neurogenic claudication   . Obesity   . Seizure disorder ([place])   . Seizures ([place])   . Stroke ([place])   . Urticaria     Past Surgical History:  Procedure Laterality Date  . ABDOMINAL HYSTERECTOMY    . APPENDECTOMY    . APPENDECTOMY    . bilateral knees    . cataracts    . CHOLECYSTECTOMY    . mammoplasty reduction    . NEUROPLASTY / TRANSPOSITION MEDIAN NERVE AT CARPAL TUNNEL BILATERAL    . RE-EXCISION OF BREAST LUMPECTOMY    . TUBAL LIGATION         Home Medications:  Prior to Admission medications   Medication Sig Start Date End Date Taking? Authorizing Provider  acetaminophen (TYLENOL) 500 MG tablet Take 500-1,000 mg by mouth every 8 (eight) hours as needed for mild pain or headache.    Yes [provider]  allopurinol (ZYLOPRIM) 100 MG tablet TAKE 1/2 TABLET BY MOUTH EVERY DAY Patient taking differently: Take 50 mg by mouth daily.  09/24/19  Yes Hawks, Christy A, [name]  aspirin 81 MG tablet Take 81 mg by mouth daily.   Yes [provider]  cefdinir (OMNICEF) 300 MG capsule Take 1 capsule (300 mg total) by mouth 2 (two) times daily for 4 days. 01/07/20 01/11/20 Yes Shah, Pratik D, DO  cetirizine (ZYRTEC) 10 MG tablet TAKE 1 TABLET BY MOUTH EVERY DAY Patient taking differently: Take 10 mg by mouth daily.  09/25/19  Yes Hawks, Christy A, [name]  divalproex (DEPAKOTE) 250 MG DR tablet TAKE ONE TABLET BY MOUTH TWICE A DAY FOR 2 WEEKS, THEN TAKE 2 TABLETS TWICE A DAY THEREAFTER Patient taking differently: Take 500 mg by mouth 2 (two) times daily.  10/20/19  Yes Kathrynn Ducking, MD  famotidine (PEPCID) 40 MG tablet Take 1 tablet (40 mg total) by mouth daily. Patient taking differently: Take 40 mg by  mouth daily as needed for heartburn.  04/08/19  Yes Kozlow, Donnamarie Poag, MD  Fluticasone Furoate (ARNUITY ELLIPTA) 200 MCG/ACT AEPB INHALE 1 PUFF BY MOUTH EVERY DAY Patient taking differently: Take 1 puff by mouth daily.  12/03/19  Yes Kozlow, Donnamarie Poag, MD  ketoconazole (NIZORAL) 2 % cream Apply to the affected areas underneath breasts, belly and in groin region ONCE daily x2-4 weeks. Patient taking differently: Apply 1 application topically See admin instructions. Apply to the affected areas underneath breasts, belly and in groin region ONCE daily for 2-4 weeks 09/16/18  Yes Gottschalk, Ashly M, DO  levETIRAcetam (KEPPRA XR) 500 MG 24 hr tablet Take 4 tablets (2,000 mg total) by mouth at bedtime. 11/10/18  Yes Suzzanne Cloud, NP  metolazone  (ZAROXOLYN) 2.5 MG tablet Take 1 Tablet by mouth on Mondays, Wednesdays and Fridays 08/17/19  Yes Hawks, Christy A, [name]  metoprolol tartrate 75 MG TABS Take 75 mg by mouth 2 (two) times daily. 01/07/20 02/06/20 Yes Shah, Pratik D, DO  mometasone (ELOCON) 0.1 % ointment Apply to skin once daily as directed after bathing. 03/04/18  Yes Kozlow, Donnamarie Poag, MD  montelukast (SINGULAIR) 10 MG tablet TAKE 1 TABLET BY MOUTH EVERYDAY AT BEDTIME Patient taking differently: Take 10 mg by mouth at bedtime.  12/03/19  Yes Kozlow, Donnamarie Poag, MD  Multiple Vitamins-Minerals (ONE-A-DAY WOMENS 50 PLUS) TABS Take 1 tablet by mouth daily with breakfast.   Yes [provider]  nystatin (MYCOSTATIN) 100000 UNIT/ML suspension Take 5 mLs (500,000 Units total) by mouth 4 (four) times daily. 12/28/19  Yes Dettinger, Fransisca Kaufmann, MD  nystatin-triamcinolone ointment [facility]) Apply 1 application topically 2 (two) times daily. 11/16/15  Yes Hawks, Christy A, [name]  omeprazole (PRILOSEC) 40 MG capsule Take 1 capsule (40 mg total) by mouth daily. Patient taking differently: Take 40 mg by mouth daily as needed (for reflux).  07/03/18  Yes Kozlow, Donnamarie Poag, MD  triamcinolone cream (KENALOG) 0.1 % Apply 1 application topically 2 (two) times daily. Patient taking differently: Apply 1 application topically 2 (two) times daily as needed (for eczema).  03/18/18  Yes Kozlow, Donnamarie Poag, MD  trimethoprim-polymyxin b (POLYTRIM) ophthalmic solution Place 1 drop into the right eye every 6 (six) hours. Patient not taking: Reported on 01/10/2020 12/18/18   [name], [name]    Inpatient Medications: Scheduled Meds: . apixaban  5 mg Oral BID  . budesonide  2 mL Inhalation BID  . cefdinir  300 mg Oral BID  . diltiazem  30 mg Oral Q6H  . divalproex  500 mg Oral BID  . levETIRAcetam  2,000 mg Oral QHS  . metolazone  2.5 mg Oral Q M,W,F  . metoprolol tartrate  25 mg Oral BID  . montelukast  10 mg Oral QHS  . multivitamin with minerals  1 tablet Oral Q  breakfast  . pantoprazole  40 mg Oral Daily   Continuous Infusions:  PRN Meds: acetaminophen, ALPRAZolam, famotidine, ondansetron (ZOFRAN) IV  Allergies:    Allergies  Allergen Reactions  . Advicor [Niacin-Lovastatin Er] Other (See Comments)    Reaction unknown to daughter  . Caduet [Amlodipine-Atorvastatin] Other (See Comments)    Reaction unknown to daughter  . Codeine Other (See Comments)    Reaction unknown to daughter  . Dilantin [Phenytoin] Other (See Comments)    Reaction unknown to daughter  . Lescol [Fluvastatin] Other (See Comments)    Reaction unknown to daughter  . Pamelor [Nortriptyline] Other (See Comments)    Reaction  unknown to daughter  . Penicillins Hives    Did it involve swelling of the face/tongue/throat, SOB, or low BP? Unk Did it involve sudden or severe rash/hives, skin peeling, or any reaction on the inside of your mouth or nose? Unk Did you need to seek medical attention at a hospital or doctor's office? Unk When did it last happen? "it was a long time ago" If all above answers are "NO", may proceed with cephalosporin use.   . Sulfa Antibiotics Hives  . Trileptal [Oxcarbazepine] Other (See Comments)    Reaction unknown to daughter  . Ultram [Tramadol] Other (See Comments)    Reaction unknown to daughter  . Welchol [Colesevelam] Other (See Comments)    Reaction unknown to daughter  . Latex Rash  . Tomato Rash    Social History:   Social History   Socioeconomic History  . Marital status: Widowed    Spouse name: Not on file  . Number of children: 4  . Years of education: Not on file  . Highest education level: Not on file  Occupational History  . Occupation: Pharmacist, hospital    Comment: retired  Tobacco Use  . Smoking status: Never Smoker  . Smokeless tobacco: Never Used  Vaping Use  . Vaping Use: Never used  Substance and Sexual Activity  . Alcohol use: No  . Drug use: No  . Sexual activity: Not Currently  Other Topics Concern  . Not on  file  Social History Narrative   Patient is right handed.   Patient drinks some caffeine daily.   Social Determinants of Health   Financial Resource Strain:   . Difficulty of Paying Living Expenses: Not on file  Food Insecurity:   . Worried About Charity fundraiser in the Last Year: Not on file  . Ran Out of Food in the Last Year: Not on file  Transportation Needs:   . Lack of Transportation (Medical): Not on file  . Lack of Transportation (Non-Medical): Not on file  Physical Activity:   . Days of Exercise per Week: Not on file  . Minutes of Exercise per Session: Not on file  Stress:   . Feeling of Stress : Not on file  Social Connections:   . Frequency of Communication with Friends and Family: Not on file  . Frequency of Social Gatherings with Friends and Family: Not on file  . Attends Religious Services: Not on file  . Active Member of Clubs or Organizations: Not on file  . Attends Archivist Meetings: Not on file  . Marital Status: Not on file  Intimate Partner Violence:   . Fear of Current or Ex-Partner: Not on file  . Emotionally Abused: Not on file  . Physically Abused: Not on file  . Sexually Abused: Not on file    Family History:     Family History  Problem Relation Age of Onset  . Diabetes Mother   . Heart disease Mother   . Heart disease Father   . Cancer Sister   . Cancer Brother   . Cancer Sister      ROS:  Please see the history of present illness.  Review of Systems  Reason unable to perform ROS: cognitive impairment.   All other ROS reviewed and negative.     Physical Exam/Data:   Vitals:   01/11/20 0600 01/11/20 0630 01/11/20 0700 01/11/20 0814  BP: (!) 140/59 (!) 125/54 (!) 118/55   Pulse: (!) 59 64 60   Resp: 18  16 16   Temp:      TempSrc:      SpO2: 94% 94% 94% 91%  Weight:      Height:       No intake or output data in the 24 hours ending 01/11/20 0838 Last 3 Weights 01/09/2020 01/04/2020 12/28/2019  Weight (lbs) 214 lb  11.7 oz 214 lb 11.7 oz 214 lb  Weight (kg) 97.4 kg 97.4 kg 97.07 kg     Body mass index is 38.04 kg/m.  General:  Well nourished, well developed, in no acute distress HEENT: normal Lymph: no adenopathy Neck: no JVD Endocrine:  No thryomegaly Vascular: No carotid bruits; FA pulses 2+ bilaterally without bruits  Cardiac:  normal S1, S2; RRR; no murmur  Lungs:  Some scattered wheezes Abd: soft, nontender, no hepatomegaly  Ext: no edema Musculoskeletal:  No deformities, BUE and BLE strength normal and equal Skin: warm and dry  Neuro:  CNs 2-12 intact, no focal abnormalities noted Psych:  Normal affect   EKG:  The EKG was personally reviewed and demonstrates:  Afib 129/m poor R wave progression ant, nonspecific ST changes Telemetry:  Telemetry was personally reviewed and demonstrates: not on telemtry-hallway bed  Relevant CV Studies: See echo below from yest  Laboratory Data:  High Sensitivity Troponin:   Recent Labs  Lab 01/06/20 1032 01/06/20 1221 01/09/20 2104 01/10/20 0030  TROPONINIHS 15 14 21* 25*     Chemistry Recent Labs  Lab 01/07/20 0608 01/09/20 2104 01/10/20 0608  NA 139 139 138  K 4.2 3.6 3.8  CL 104 102 103  CO2 25 23 23   GLUCOSE 82 104* 109*  BUN 28* 26* 24*  CREATININE 1.44* 1.36* 1.25*  CALCIUM 9.0 10.3 9.8  GFRNONAA 31* 33* 37*  GFRAA 36* 39* 43*  ANIONGAP 10 14 12     Recent Labs  Lab 01/05/20 0130 01/10/20 0608  PROT 7.2 6.8  ALBUMIN 2.6* 2.7*  AST 23 26  ALT 18 18  ALKPHOS 90 70  BILITOT 0.3 0.5   Hematology Recent Labs  Lab 01/07/20 0608 01/09/20 2104 01/10/20 0608  WBC 14.3* 17.3* 15.9*  RBC 3.87 4.36 4.09  HGB 8.9* 10.3* 9.5*  HCT 29.0* 31.3* 29.9*  MCV 74.9* 71.8* 73.1*  MCH 23.0* 23.6* 23.2*  MCHC 30.7 32.9 31.8  RDW 18.6* 19.6* 18.3*  PLT 776* 973* 856*   BNPNo results for input(s): BNP, PROBNP in the last 168 hours.  DDimer No results for input(s): DDIMER in the last 168 hours.   Radiology/Studies:  DG Chest  2 View  Result Date: 01/09/2020 CLINICAL DATA:  Chest pain EXAM: CHEST - 2 VIEW COMPARISON:  01/04/2020 FINDINGS: There are persistent but improved airspace opacities in the right upper lobe. The heart size is stable. There is no pneumothorax. Again noted are findings of thyromegaly. There is no acute osseous abnormality. There are degenerative changes of both glenohumeral joints. IMPRESSION: Persistent but improved airspace opacities in the right upper lobe. Electronically Signed   By: Constance Holster M.D.   On: 01/09/2020 21:54   ECHOCARDIOGRAM COMPLETE  Result Date: 01/10/2020    ECHOCARDIOGRAM REPORT   Patient Name:   [name] Date of Exam: 01/10/2020 Medical Rec #:  [id number]      Height:       63.0 in Accession #:    [id number]     Weight:       214.7 lb Date of Birth:  April 17, 1923      BSA:  1.993 m Patient Age:    71 years       BP:           125/85 mmHg Patient Gender: F              HR:           86 bpm. Exam Location:  [name] Procedure: 2D Echo, Cardiac Doppler and Color Doppler Indications:    Atrial flutter  History:        Patient has prior history of Echocardiogram examinations, most                 recent 12/31/2018. Stroke, Arrythmias:Atrial Fibrillation; Risk                 Factors:Hypertension and Obesity.  Sonographer:    Dustin Flock RDCS Referring Phys: [id number] ASIA B [place]  1. Left ventricular ejection fraction, by estimation, is 60 to 65%. The left ventricle has normal function. The left ventricle has no regional wall motion abnormalities. There is mild concentric left ventricular hypertrophy. Left ventricular diastolic parameters are consistent with Grade I diastolic dysfunction (impaired relaxation).  2. Right ventricular systolic function is normal. The right ventricular size is normal. There is normal pulmonary artery systolic pressure.  3. Left atrial size was mildly dilated.  4. The mitral valve is normal in structure. Trivial mitral  valve regurgitation. No evidence of mitral stenosis.  5. The aortic valve is normal in structure. Aortic valve regurgitation is not visualized. Mild aortic valve sclerosis is present, with no evidence of aortic valve stenosis. FINDINGS  Left Ventricle: Left ventricular ejection fraction, by estimation, is 60 to 65%. The left ventricle has normal function. The left ventricle has no regional wall motion abnormalities. The left ventricular internal cavity size was normal in size. There is  mild concentric left ventricular hypertrophy. Left ventricular diastolic parameters are consistent with Grade I diastolic dysfunction (impaired relaxation). Normal left ventricular filling pressure. Right Ventricle: The right ventricular size is normal. No increase in right ventricular wall thickness. Right ventricular systolic function is normal. There is normal pulmonary artery systolic pressure. The tricuspid regurgitant velocity is 2.82 m/s, and  with an assumed right atrial pressure of 3 mmHg, the estimated right ventricular systolic pressure is 14.4 mmHg. Left Atrium: Left atrial size was mildly dilated. Right Atrium: Right atrial size was normal in size. Pericardium: There is no evidence of pericardial effusion. Mitral Valve: The mitral valve is normal in structure. Trivial mitral valve regurgitation. No evidence of mitral valve stenosis. Tricuspid Valve: The tricuspid valve is normal in structure. Tricuspid valve regurgitation is not demonstrated. No evidence of tricuspid stenosis. Aortic Valve: The aortic valve is normal in structure. Aortic valve regurgitation is not visualized. Mild aortic valve sclerosis is present, with no evidence of aortic valve stenosis. Pulmonic Valve: The pulmonic valve was normal in structure. Pulmonic valve regurgitation is not visualized. No evidence of pulmonic stenosis. Aorta: The aortic root is normal in size and structure. Venous: The inferior vena cava was not well visualized. IAS/Shunts: No  atrial level shunt detected by color flow Doppler.  LEFT VENTRICLE PLAX 2D LVIDd:         4.31 cm  Diastology LVIDs:         2.92 cm  LV e' medial:    0.07 cm/s LV PW:         1.27 cm  LV E/e' medial:  7.0 LV IVS:        1.27  cm  LV e' lateral:   0.11 cm/s LVOT diam:     2.30 cm  LV E/e' lateral: 4.0 LV SV:         62 LV SV Index:   31 LVOT Area:     4.15 cm  RIGHT VENTRICLE RV Basal diam:  2.86 cm RV S prime:     14.30 cm/s TAPSE (M-mode): 2.6 cm LEFT ATRIUM             Index       RIGHT ATRIUM           Index LA diam:        3.90 cm 1.96 cm/m  RA Area:     16.30 cm LA Vol (A2C):   43.0 ml 21.58 ml/m RA Volume:   43.30 ml  21.73 ml/m LA Vol (A4C):   25.9 ml 13.00 ml/m LA Biplane Vol: 35.9 ml 18.01 ml/m  AORTIC VALVE LVOT Vmax:   81.60 cm/s LVOT Vmean:  53.300 cm/s LVOT VTI:    0.150 m  AORTA Ao Root diam: 2.80 cm MITRAL VALVE               TRICUSPID VALVE MV Area (PHT): 4.54 cm    TR Peak grad:   31.8 mmHg MV Decel Time: 167 msec    TR Vmax:        282.00 cm/s MV E velocity: 0.46 cm/s MV A velocity: 58.30 cm/s  SHUNTS MV E/A ratio:  0.01        Systemic VTI:  0.15 m                            Systemic Diam: 2.30 cm Dani Gobble Croitoru MD Electronically signed by Sanda Klein MD Signature Date/Time: 01/10/2020/12:46:47 PM    Final    {       Assessment and Plan:   1. Aflutter with RVR converted to NSR, CHADSVASC=7 started on Eliquis. Echo 01/10/20 normal LVEF mild LVH grade 1 DD. Patient lives alone and has stablitiy issues and has fallen. Is also anemic and CKD. Need to talk with daughter before committing her to Eliquis. Not on telemetry b/c of hallway bed. Check EKG 2. HTN now on metoprolol and diltiazem-need to watch on tele 3. HLD 4. History of CVA and seizures 5. CKD stage 3-4. 6. Recent discharge 01/07/20 with sepsis pneumonia-improved but persistent opacities on CXR      For questions or updates, please contact [place] Please consult [email or web address] for contact info under     Signed, Ermalinda Barrios, PA-C  01/11/2020 8:38 AM  Attending note  Patient seen and discussed with PA Bonnell Public, I agree with her documentation. 86 yo female history of HTN, CKD 3,seizures,s stroke, presents with chest tightness. Found to be in aflutter with RVR.   K 3.6 Cr 1.36 BUN 26 WBC 17.3 Hgb 10.3 Plt 973 TSH 1.459  hstrop 21-->25--> COVID neg CXR persistent but improved RUL opacities EKG probable aflutter Echo LVEF 60-65%, no WMAs, grade I DDx, mild LAE   . Available 12 lead difficult to interpret rhythm, I reviewed the tele from the ER and confirmed she was in aflutter. Currently on diltiazem 120mg  daily, eliquis 2.74m bid (age 14, Cr 1.53), lopressor 25mg  bid. I discussed with daughter [name] the risks vs benefits of anticoagulation. Patient with relatively frequent falls, often seizure related. In discussions and mutual decision making with family it has been decided too high risk  for anticoagulation given bleeding risk, will d/c eliquis.   No further cardiology workup at this time, we will sign off care and arrange out patient follow up.    Carlyle Dolly MD

## 2020-01-11 NOTE — ED Notes (Signed)
Pt daughter contact information: [phone]

## 2020-01-11 NOTE — NC FL2 (Signed)
[place] LEVEL OF CARE SCREENING TOOL     IDENTIFICATION  Patient Name: [name] Birthdate: Jul 25, 1923 Sex: female Admission Date (Current Location): 01/09/2020  [facility] and [place] Number:  [employer] and Address:  [facility] [address] [place]      Provider Number: [id number]  Attending Physician Name and Address:  Orson Eva, MD  Relative Name and Phone Number:  Wretha Laris [phone]    Current Level of Care: Hospital Recommended Level of Care: [place] Prior Approval Number:    Date Approved/Denied: 01/11/20 PASRR Number: [id number]  Discharge Plan: SNF    Current Diagnoses: Patient Active Problem List   Diagnosis Date Noted  . Atrial fibrillation with RVR ([place]) 01/10/2020  . A-fib ([place]) 01/09/2020  . Pneumonia 01/04/2020  . Class 2 obesity 01/04/2020  . CKD (chronic kidney disease) stage 3, GFR 30-59 ml/min 01/04/2020  . Nausea vomiting and diarrhea 03/12/2019  . Lung nodules 03/12/2019  . Atypical chest pain 12/31/2018  . Asthma 02/12/2017  . Chest pain 11/13/2016  . History of seizures 11/13/2016  . History of stroke 11/13/2016  . Allergic urticaria 08/30/2016  . GAD (generalized anxiety disorder) 08/30/2016  . Gout 08/30/2016  . Acute renal failure superimposed on stage 3 chronic kidney disease ([place]) 10/11/2015  . Neurogenic claudication   . Morbid obesity ([place])   . Edema   . Essential hypertension, benign 09/30/2014  . Secondary hyperparathyroidism, renal ([place]) 09/30/2014  . Proteinuria 09/30/2014  . Anemia associated with chronic renal failure 09/30/2014  . Chronic kidney disease (CKD), stage IV (severe) ([place]) 09/30/2014  . Hiatal hernia 08/21/2012  . Seizure disorder ([place]) 08/21/2012    Orientation RESPIRATION BLADDER Height & Weight     Self, Time, Situation, Place  Normal Incontinent Weight: 215 lb 13.3 oz (97.9 kg) Height:  5\' 4"  (162.6 cm)  BEHAVIORAL  SYMPTOMS/MOOD NEUROLOGICAL BOWEL NUTRITION STATUS    Convulsions/Seizures Continent Diet (Diet Heart Room service appropriate? Yes; Fluid consistency: Thin)  AMBULATORY STATUS COMMUNICATION OF NEEDS Skin   Limited Assist Verbally Normal                       Personal Care Assistance Level of Assistance  Bathing, Feeding, Dressing Bathing Assistance: Maximum assistance Feeding assistance: Limited assistance Dressing Assistance: Limited assistance     Functional Limitations Info  Sight, Hearing, Speech Sight Info: Impaired Hearing Info: Adequate Speech Info: Adequate    SPECIAL CARE FACTORS FREQUENCY  PT (By licensed PT)     PT Frequency: 5x week              Contractures Contractures Info: Not present    Additional Factors Info  Code Status Code Status Info: Full             Current Medications (01/11/2020):  This is the current hospital active medication list Current Facility-Administered Medications  Medication Dose Route Frequency Provider Last Rate Last Admin  . acetaminophen (TYLENOL) tablet 650 mg  650 mg Oral Q4H PRN Zierle-Ghosh, Asia B, DO      . ALPRAZolam (XANAX) tablet 0.25 mg  0.25 mg Oral BID PRN Zierle-Ghosh, Asia B, DO      . apixaban (ELIQUIS) tablet 2.5 mg  2.5 mg Oral BID Tat, David, MD      . budesonide (PULMICORT) nebulizer solution 0.25 mg  2 mL Inhalation BID Zierle-Ghosh, Asia B, DO   0.25 mg at 01/11/20 7673  . [  START ON 01/12/2020] cefdinir (OMNICEF) capsule 300 mg  300 mg Oral Daily Tat, David, MD      . diltiazem (CARDIZEM CD) 24 hr capsule 120 mg  120 mg Oral Daily Tat, David, MD   120 mg at 01/11/20 1229  . divalproex (DEPAKOTE) DR tablet 500 mg  500 mg Oral BID Zierle-Ghosh, Asia B, DO   500 mg at 01/11/20 0955  . famotidine (PEPCID) tablet 20 mg  20 mg Oral Daily PRN Tat, Shanon Brow, MD      . levETIRAcetam (KEPPRA XR) 24 hr tablet 2,000 mg  2,000 mg Oral QHS Zierle-Ghosh, Asia B, DO   2,000 mg at 01/10/20 2304  . metolazone  (ZAROXOLYN) tablet 2.5 mg  2.5 mg Oral Q M,W,F Tat, Shanon Brow, MD   2.5 mg at 01/11/20 0954  . metoprolol tartrate (LOPRESSOR) tablet 25 mg  25 mg Oral BID Orson Eva, MD   25 mg at 01/11/20 0955  . montelukast (SINGULAIR) tablet 10 mg  10 mg Oral QHS Zierle-Ghosh, Asia B, DO   10 mg at 01/10/20 2305  . multivitamin with minerals tablet 1 tablet  1 tablet Oral Q breakfast Zierle-Ghosh, Asia B, DO   1 tablet at 01/11/20 0955  . ondansetron (ZOFRAN) injection 4 mg  4 mg Intravenous Q6H PRN Zierle-Ghosh, Asia B, DO      . pantoprazole (PROTONIX) EC tablet 40 mg  40 mg Oral Daily Zierle-Ghosh, Asia B, DO   40 mg at 01/11/20 9147     Discharge Medications: Please see discharge summary for a list of discharge medications.  Relevant Imaging Results:  Relevant Lab Results:   Additional Information Pt SSN:[id number]  Natasha Bence, LCSW

## 2020-01-11 NOTE — Plan of Care (Signed)
  Problem: Acute Rehab PT Goals(only PT should resolve) Goal: Pt Will Go Supine/Side To Sit Outcome: Progressing Flowsheets (Taken 01/11/2020 1009) Pt will go Supine/Side to Sit:  with supervision  with modified independence Goal: Patient Will Perform Sitting Balance 01/11/2020 1010 by Lonell Grandchild, [place] (Taken 01/11/2020 1010) Patient will perform sitting balance: Independently 01/11/2020 1009 by Lonell Grandchild, PT Outcome: Progressing Goal: Pt Will Transfer Bed To Chair/Chair To Bed 01/11/2020 1010 by Lonell Grandchild, [place] (Taken 01/11/2020 1010) Pt will Transfer Bed to Chair/Chair to Bed:  with supervision  with modified independence 01/11/2020 1009 by Lonell Grandchild, PT Outcome: Progressing Goal: Pt Will Ambulate 01/11/2020 1010 by Lonell Grandchild, [place] (Taken 01/11/2020 1010) Pt will Ambulate:  100 feet  with supervision 01/11/2020 1009 by Lonell Grandchild, PT Outcome: Progressing   Problem: Acute Rehab PT Goals(only PT should resolve) Goal: Patient Will Transfer Sit To/From Stand Outcome: Progressing Flowsheets (Taken 01/11/2020 1010) Patient will transfer sit to/from stand:  with supervision  with modified independence   10:11 AM, 01/11/20 Lonell Grandchild, MPT Physical Therapist with [facility] [phone] office [phone] mobile phone

## 2020-01-12 DIAGNOSIS — R0789 Other chest pain: Secondary | ICD-10-CM | POA: Diagnosis not present

## 2020-01-12 DIAGNOSIS — M48062 Spinal stenosis, lumbar region with neurogenic claudication: Secondary | ICD-10-CM | POA: Diagnosis not present

## 2020-01-12 DIAGNOSIS — J45909 Unspecified asthma, uncomplicated: Secondary | ICD-10-CM | POA: Diagnosis not present

## 2020-01-12 DIAGNOSIS — R509 Fever, unspecified: Secondary | ICD-10-CM | POA: Diagnosis not present

## 2020-01-12 DIAGNOSIS — F05 Delirium due to known physiological condition: Secondary | ICD-10-CM | POA: Diagnosis not present

## 2020-01-12 DIAGNOSIS — R0989 Other specified symptoms and signs involving the circulatory and respiratory systems: Secondary | ICD-10-CM | POA: Diagnosis not present

## 2020-01-12 DIAGNOSIS — J189 Pneumonia, unspecified organism: Secondary | ICD-10-CM | POA: Diagnosis not present

## 2020-01-12 DIAGNOSIS — I480 Paroxysmal atrial fibrillation: Secondary | ICD-10-CM | POA: Diagnosis not present

## 2020-01-12 DIAGNOSIS — I4891 Unspecified atrial fibrillation: Secondary | ICD-10-CM | POA: Diagnosis not present

## 2020-01-12 DIAGNOSIS — Z23 Encounter for immunization: Secondary | ICD-10-CM | POA: Diagnosis not present

## 2020-01-12 DIAGNOSIS — I10 Essential (primary) hypertension: Secondary | ICD-10-CM

## 2020-01-12 DIAGNOSIS — R5383 Other fatigue: Secondary | ICD-10-CM | POA: Diagnosis not present

## 2020-01-12 DIAGNOSIS — Z8673 Personal history of transient ischemic attack (TIA), and cerebral infarction without residual deficits: Secondary | ICD-10-CM | POA: Diagnosis not present

## 2020-01-12 DIAGNOSIS — K219 Gastro-esophageal reflux disease without esophagitis: Secondary | ICD-10-CM | POA: Diagnosis not present

## 2020-01-12 DIAGNOSIS — R627 Adult failure to thrive: Secondary | ICD-10-CM | POA: Diagnosis not present

## 2020-01-12 DIAGNOSIS — M6281 Muscle weakness (generalized): Secondary | ICD-10-CM | POA: Diagnosis not present

## 2020-01-12 DIAGNOSIS — R5381 Other malaise: Secondary | ICD-10-CM | POA: Diagnosis not present

## 2020-01-12 DIAGNOSIS — Z139 Encounter for screening, unspecified: Secondary | ICD-10-CM | POA: Diagnosis not present

## 2020-01-12 DIAGNOSIS — N1832 Chronic kidney disease, stage 3b: Secondary | ICD-10-CM | POA: Diagnosis not present

## 2020-01-12 DIAGNOSIS — R918 Other nonspecific abnormal finding of lung field: Secondary | ICD-10-CM | POA: Diagnosis not present

## 2020-01-12 DIAGNOSIS — R451 Restlessness and agitation: Secondary | ICD-10-CM | POA: Diagnosis not present

## 2020-01-12 DIAGNOSIS — G40909 Epilepsy, unspecified, not intractable, without status epilepticus: Secondary | ICD-10-CM | POA: Diagnosis not present

## 2020-01-12 DIAGNOSIS — K449 Diaphragmatic hernia without obstruction or gangrene: Secondary | ICD-10-CM | POA: Diagnosis not present

## 2020-01-12 DIAGNOSIS — I6930 Unspecified sequelae of cerebral infarction: Secondary | ICD-10-CM | POA: Diagnosis not present

## 2020-01-12 DIAGNOSIS — F411 Generalized anxiety disorder: Secondary | ICD-10-CM | POA: Diagnosis not present

## 2020-01-12 LAB — RESPIRATORY PANEL BY RT PCR (FLU A&B, COVID)
Influenza A by PCR: NEGATIVE
Influenza B by PCR: NEGATIVE
SARS Coronavirus 2 by RT PCR: NEGATIVE

## 2020-01-12 MED ORDER — METOPROLOL TARTRATE 25 MG PO TABS: 25.0000 mg | ORAL_TABLET | Freq: Two times a day (BID) | ORAL | Status: DC

## 2020-01-12 MED ORDER — FLUCONAZOLE 100 MG PO TABS
100.0000 mg | ORAL_TABLET | Freq: Once | ORAL | Status: AC
  Administered 2020-01-12: 100 mg via ORAL
  Filled 2020-01-12: qty 1

## 2020-01-12 MED ORDER — DILTIAZEM HCL ER COATED BEADS 120 MG PO CP24: 120.0000 mg | ORAL_CAPSULE | Freq: Every day | ORAL | Status: DC

## 2020-01-12 NOTE — TOC Transition Note (Signed)
Transition of Care [facility]) - CM/SW Discharge Note   Patient Details  Name: [name] MRN: [id number] Date of Birth: Mar 05, 1924  Transition of [facility]) CM/SW Contact:  Shade Flood, LCSW Phone Number: 01/12/2020, 3:12 PM   Clinical Narrative:     Pt stable for dc today. SNF bed offers were discussed with pt's daughter, [name], this AM and [facility] bed offer was accepted. Updated Claiborne Billings at [facility] and they can take pt today. DC clinical was sent electronically. Transport was set up through [employer] at daughter's request. Pt will be picked up at the main entrance at 1600. RN to call report. Transport will be with a Silver Lissa Hoard with a [employer] on the [place]. Driver is [place].  Updated RN.  There are no other TOC needs identified for dc.  Final next level of care: Skilled Nursing Facility Barriers to Discharge: Barriers Resolved   Patient Goals and CMS Choice Patient states their goals for this hospitalization and ongoing recovery are:: SNF   Choice offered to / list presented to : Adult Children  Discharge Placement              Patient chooses bed at: [facility] Patient to be transferred to facility by: daughter Name of family member notified: [name]- daughter Patient and family notified of of transfer: 01/12/20  Discharge Plan and Services                                     Social Determinants of Health ([place]) Interventions     Readmission Risk Interventions Readmission Risk Prevention Plan 01/07/2020  Transportation Screening Complete  PCP or Specialist Appt within 5-7 Days Complete  Home Care Screening Complete  Medication Review (RN CM) Complete  Some recent data might be hidden

## 2020-01-14 DIAGNOSIS — N1832 Chronic kidney disease, stage 3b: Secondary | ICD-10-CM | POA: Diagnosis not present

## 2020-01-14 DIAGNOSIS — J45909 Unspecified asthma, uncomplicated: Secondary | ICD-10-CM | POA: Diagnosis not present

## 2020-01-14 DIAGNOSIS — G40909 Epilepsy, unspecified, not intractable, without status epilepticus: Secondary | ICD-10-CM | POA: Diagnosis not present

## 2020-01-14 DIAGNOSIS — I480 Paroxysmal atrial fibrillation: Secondary | ICD-10-CM | POA: Diagnosis not present

## 2020-01-15 DIAGNOSIS — R451 Restlessness and agitation: Secondary | ICD-10-CM | POA: Diagnosis not present

## 2020-01-15 DIAGNOSIS — R5381 Other malaise: Secondary | ICD-10-CM | POA: Diagnosis not present

## 2020-01-15 DIAGNOSIS — I6930 Unspecified sequelae of cerebral infarction: Secondary | ICD-10-CM | POA: Diagnosis not present

## 2020-01-15 DIAGNOSIS — M6281 Muscle weakness (generalized): Secondary | ICD-10-CM | POA: Diagnosis not present

## 2020-01-15 LAB — CULTURE, BLOOD (ROUTINE X 2)
Culture: NO GROWTH
Culture: NO GROWTH
Special Requests: ADEQUATE

## 2020-01-18 DIAGNOSIS — J189 Pneumonia, unspecified organism: Secondary | ICD-10-CM | POA: Diagnosis not present

## 2020-01-18 DIAGNOSIS — R5381 Other malaise: Secondary | ICD-10-CM | POA: Diagnosis not present

## 2020-01-18 DIAGNOSIS — R0989 Other specified symptoms and signs involving the circulatory and respiratory systems: Secondary | ICD-10-CM | POA: Diagnosis not present

## 2020-01-18 DIAGNOSIS — F05 Delirium due to known physiological condition: Secondary | ICD-10-CM | POA: Diagnosis not present

## 2020-01-18 DIAGNOSIS — R5383 Other fatigue: Secondary | ICD-10-CM | POA: Diagnosis not present

## 2020-01-18 DIAGNOSIS — R509 Fever, unspecified: Secondary | ICD-10-CM | POA: Diagnosis not present

## 2020-01-18 DIAGNOSIS — M6281 Muscle weakness (generalized): Secondary | ICD-10-CM | POA: Diagnosis not present

## 2020-01-19 ENCOUNTER — Ambulatory Visit: Payer: Medicare PPO | Admitting: Allergy and Immunology

## 2020-01-21 DIAGNOSIS — R5381 Other malaise: Secondary | ICD-10-CM | POA: Diagnosis not present

## 2020-01-21 DIAGNOSIS — R0989 Other specified symptoms and signs involving the circulatory and respiratory systems: Secondary | ICD-10-CM | POA: Diagnosis not present

## 2020-01-21 DIAGNOSIS — M6281 Muscle weakness (generalized): Secondary | ICD-10-CM | POA: Diagnosis not present

## 2020-01-21 DIAGNOSIS — F05 Delirium due to known physiological condition: Secondary | ICD-10-CM | POA: Diagnosis not present

## 2020-01-21 DIAGNOSIS — J189 Pneumonia, unspecified organism: Secondary | ICD-10-CM | POA: Diagnosis not present

## 2020-01-21 DIAGNOSIS — R5383 Other fatigue: Secondary | ICD-10-CM | POA: Diagnosis not present

## 2020-01-26 DIAGNOSIS — I4891 Unspecified atrial fibrillation: Secondary | ICD-10-CM | POA: Diagnosis not present

## 2020-01-26 DIAGNOSIS — Z8673 Personal history of transient ischemic attack (TIA), and cerebral infarction without residual deficits: Secondary | ICD-10-CM | POA: Diagnosis not present

## 2020-01-26 DIAGNOSIS — M6281 Muscle weakness (generalized): Secondary | ICD-10-CM | POA: Diagnosis not present

## 2020-01-26 DIAGNOSIS — I10 Essential (primary) hypertension: Secondary | ICD-10-CM | POA: Diagnosis not present

## 2020-01-26 DIAGNOSIS — K219 Gastro-esophageal reflux disease without esophagitis: Secondary | ICD-10-CM | POA: Diagnosis not present

## 2020-01-26 DIAGNOSIS — F411 Generalized anxiety disorder: Secondary | ICD-10-CM | POA: Diagnosis not present

## 2020-01-28 DIAGNOSIS — I10 Essential (primary) hypertension: Secondary | ICD-10-CM | POA: Diagnosis not present

## 2020-01-28 DIAGNOSIS — N1832 Chronic kidney disease, stage 3b: Secondary | ICD-10-CM | POA: Diagnosis not present

## 2020-01-28 DIAGNOSIS — R0789 Other chest pain: Secondary | ICD-10-CM | POA: Diagnosis not present

## 2020-01-28 DIAGNOSIS — R627 Adult failure to thrive: Secondary | ICD-10-CM | POA: Diagnosis not present

## 2020-01-28 DIAGNOSIS — M6281 Muscle weakness (generalized): Secondary | ICD-10-CM | POA: Diagnosis not present

## 2020-01-28 DIAGNOSIS — F411 Generalized anxiety disorder: Secondary | ICD-10-CM | POA: Diagnosis not present

## 2020-01-29 DIAGNOSIS — J45909 Unspecified asthma, uncomplicated: Secondary | ICD-10-CM | POA: Diagnosis not present

## 2020-01-29 DIAGNOSIS — G8929 Other chronic pain: Secondary | ICD-10-CM | POA: Diagnosis not present

## 2020-01-29 DIAGNOSIS — J188 Other pneumonia, unspecified organism: Secondary | ICD-10-CM | POA: Diagnosis not present

## 2020-01-29 DIAGNOSIS — I4891 Unspecified atrial fibrillation: Secondary | ICD-10-CM | POA: Diagnosis not present

## 2020-01-29 DIAGNOSIS — B370 Candidal stomatitis: Secondary | ICD-10-CM | POA: Diagnosis not present

## 2020-01-29 DIAGNOSIS — I129 Hypertensive chronic kidney disease with stage 1 through stage 4 chronic kidney disease, or unspecified chronic kidney disease: Secondary | ICD-10-CM | POA: Diagnosis not present

## 2020-01-29 DIAGNOSIS — I471 Supraventricular tachycardia: Secondary | ICD-10-CM | POA: Diagnosis not present

## 2020-01-29 DIAGNOSIS — M545 Low back pain, unspecified: Secondary | ICD-10-CM | POA: Diagnosis not present

## 2020-01-29 DIAGNOSIS — N1832 Chronic kidney disease, stage 3b: Secondary | ICD-10-CM | POA: Diagnosis not present

## 2020-02-01 ENCOUNTER — Telehealth: Payer: Self-pay

## 2020-02-01 NOTE — Telephone Encounter (Signed)
FOLLOW UP SCHEDULED

## 2020-02-03 ENCOUNTER — Other Ambulatory Visit: Payer: Self-pay

## 2020-02-03 ENCOUNTER — Encounter (HOSPITAL_COMMUNITY): Payer: Self-pay

## 2020-02-03 ENCOUNTER — Inpatient Hospital Stay (HOSPITAL_COMMUNITY)
Admission: EM | Admit: 2020-02-03 | Discharge: 2020-02-10 | DRG: 308 | Disposition: A | Discharge status: Skilled Nursing Facility | Payer: Medicare PPO | Attending: Family Medicine | Admitting: Family Medicine

## 2020-02-03 ENCOUNTER — Emergency Department (HOSPITAL_COMMUNITY): Payer: Medicare PPO

## 2020-02-03 DIAGNOSIS — I4892 Unspecified atrial flutter: Secondary | ICD-10-CM | POA: Diagnosis present

## 2020-02-03 DIAGNOSIS — F0390 Unspecified dementia without behavioral disturbance: Secondary | ICD-10-CM | POA: Diagnosis present

## 2020-02-03 DIAGNOSIS — I480 Paroxysmal atrial fibrillation: Secondary | ICD-10-CM | POA: Diagnosis present

## 2020-02-03 DIAGNOSIS — E785 Hyperlipidemia, unspecified: Secondary | ICD-10-CM | POA: Diagnosis present

## 2020-02-03 DIAGNOSIS — Z7189 Other specified counseling: Secondary | ICD-10-CM

## 2020-02-03 DIAGNOSIS — I69319 Unspecified symptoms and signs involving cognitive functions following cerebral infarction: Secondary | ICD-10-CM | POA: Diagnosis not present

## 2020-02-03 DIAGNOSIS — I472 Ventricular tachycardia: Secondary | ICD-10-CM | POA: Diagnosis present

## 2020-02-03 DIAGNOSIS — G40909 Epilepsy, unspecified, not intractable, without status epilepticus: Secondary | ICD-10-CM

## 2020-02-03 DIAGNOSIS — I471 Supraventricular tachycardia, unspecified: Secondary | ICD-10-CM | POA: Diagnosis present

## 2020-02-03 DIAGNOSIS — I959 Hypotension, unspecified: Secondary | ICD-10-CM | POA: Diagnosis present

## 2020-02-03 DIAGNOSIS — Z90710 Acquired absence of both cervix and uterus: Secondary | ICD-10-CM

## 2020-02-03 DIAGNOSIS — B370 Candidal stomatitis: Secondary | ICD-10-CM | POA: Diagnosis not present

## 2020-02-03 DIAGNOSIS — Z515 Encounter for palliative care: Secondary | ICD-10-CM

## 2020-02-03 DIAGNOSIS — M545 Low back pain, unspecified: Secondary | ICD-10-CM | POA: Diagnosis not present

## 2020-02-03 DIAGNOSIS — I4891 Unspecified atrial fibrillation: Secondary | ICD-10-CM | POA: Diagnosis present

## 2020-02-03 DIAGNOSIS — D631 Anemia in chronic kidney disease: Secondary | ICD-10-CM | POA: Diagnosis present

## 2020-02-03 DIAGNOSIS — N1832 Chronic kidney disease, stage 3b: Secondary | ICD-10-CM | POA: Diagnosis present

## 2020-02-03 DIAGNOSIS — E875 Hyperkalemia: Secondary | ICD-10-CM | POA: Diagnosis present

## 2020-02-03 DIAGNOSIS — N184 Chronic kidney disease, stage 4 (severe): Secondary | ICD-10-CM | POA: Diagnosis not present

## 2020-02-03 DIAGNOSIS — E860 Dehydration: Secondary | ICD-10-CM | POA: Diagnosis present

## 2020-02-03 DIAGNOSIS — M109 Gout, unspecified: Secondary | ICD-10-CM | POA: Diagnosis present

## 2020-02-03 DIAGNOSIS — R569 Unspecified convulsions: Secondary | ICD-10-CM | POA: Diagnosis present

## 2020-02-03 DIAGNOSIS — J189 Pneumonia, unspecified organism: Secondary | ICD-10-CM | POA: Diagnosis present

## 2020-02-03 DIAGNOSIS — Z20822 Contact with and (suspected) exposure to covid-19: Secondary | ICD-10-CM | POA: Diagnosis present

## 2020-02-03 DIAGNOSIS — J188 Other pneumonia, unspecified organism: Secondary | ICD-10-CM | POA: Diagnosis not present

## 2020-02-03 DIAGNOSIS — E871 Hypo-osmolality and hyponatremia: Secondary | ICD-10-CM | POA: Diagnosis present

## 2020-02-03 DIAGNOSIS — R0989 Other specified symptoms and signs involving the circulatory and respiratory systems: Secondary | ICD-10-CM | POA: Diagnosis not present

## 2020-02-03 DIAGNOSIS — J90 Pleural effusion, not elsewhere classified: Secondary | ICD-10-CM | POA: Diagnosis not present

## 2020-02-03 DIAGNOSIS — M79602 Pain in left arm: Secondary | ICD-10-CM | POA: Diagnosis present

## 2020-02-03 DIAGNOSIS — I69398 Other sequelae of cerebral infarction: Secondary | ICD-10-CM | POA: Diagnosis not present

## 2020-02-03 DIAGNOSIS — I129 Hypertensive chronic kidney disease with stage 1 through stage 4 chronic kidney disease, or unspecified chronic kidney disease: Secondary | ICD-10-CM | POA: Diagnosis present

## 2020-02-03 DIAGNOSIS — R55 Syncope and collapse: Secondary | ICD-10-CM | POA: Diagnosis not present

## 2020-02-03 DIAGNOSIS — J811 Chronic pulmonary edema: Secondary | ICD-10-CM | POA: Diagnosis not present

## 2020-02-03 DIAGNOSIS — I493 Ventricular premature depolarization: Secondary | ICD-10-CM | POA: Diagnosis present

## 2020-02-03 DIAGNOSIS — N179 Acute kidney failure, unspecified: Secondary | ICD-10-CM | POA: Diagnosis present

## 2020-02-03 DIAGNOSIS — M1A9XX0 Chronic gout, unspecified, without tophus (tophi): Secondary | ICD-10-CM | POA: Diagnosis not present

## 2020-02-03 DIAGNOSIS — R296 Repeated falls: Secondary | ICD-10-CM | POA: Diagnosis present

## 2020-02-03 DIAGNOSIS — I499 Cardiac arrhythmia, unspecified: Secondary | ICD-10-CM | POA: Diagnosis not present

## 2020-02-03 DIAGNOSIS — R0902 Hypoxemia: Secondary | ICD-10-CM | POA: Diagnosis not present

## 2020-02-03 DIAGNOSIS — N183 Chronic kidney disease, stage 3 unspecified: Secondary | ICD-10-CM | POA: Diagnosis not present

## 2020-02-03 DIAGNOSIS — J45909 Unspecified asthma, uncomplicated: Secondary | ICD-10-CM | POA: Diagnosis not present

## 2020-02-03 DIAGNOSIS — G8929 Other chronic pain: Secondary | ICD-10-CM | POA: Diagnosis not present

## 2020-02-03 DIAGNOSIS — I10 Essential (primary) hypertension: Secondary | ICD-10-CM | POA: Diagnosis not present

## 2020-02-03 DIAGNOSIS — I69318 Other symptoms and signs involving cognitive functions following cerebral infarction: Secondary | ICD-10-CM | POA: Diagnosis not present

## 2020-02-03 DIAGNOSIS — G4089 Other seizures: Secondary | ICD-10-CM | POA: Diagnosis not present

## 2020-02-03 LAB — BASIC METABOLIC PANEL
Anion gap: 10 (ref 5–15)
BUN: 47 mg/dL — ABNORMAL HIGH (ref 8–23)
CO2: 21 mmol/L — ABNORMAL LOW (ref 22–32)
Calcium: 9.3 mg/dL (ref 8.9–10.3)
Chloride: 101 mmol/L (ref 98–111)
Creatinine, Ser: 1.94 mg/dL — ABNORMAL HIGH (ref 0.44–1.00)
GFR, Estimated: 22 mL/min — ABNORMAL LOW (ref >60)
Glucose, Bld: 116 mg/dL — ABNORMAL HIGH (ref 70–99)
Potassium: 5.7 mmol/L — ABNORMAL HIGH (ref 3.5–5.1)
Sodium: 132 mmol/L — ABNORMAL LOW (ref 135–145)

## 2020-02-03 LAB — CBC
HCT: 27.3 % — ABNORMAL LOW (ref 36.0–46.0)
Hemoglobin: 8.7 g/dL — ABNORMAL LOW (ref 12.0–15.0)
MCH: 22.9 pg — ABNORMAL LOW (ref 26.0–34.0)
MCHC: 31.9 g/dL (ref 30.0–36.0)
MCV: 71.8 fL — ABNORMAL LOW (ref 80.0–100.0)
Platelets: 478 10*3/uL — ABNORMAL HIGH (ref 150–400)
RBC: 3.8 MIL/uL — ABNORMAL LOW (ref 3.87–5.11)
RDW: 22.5 % — ABNORMAL HIGH (ref 11.5–15.5)
WBC: 23.4 10*3/uL — ABNORMAL HIGH (ref 4.0–10.5)
nRBC: 5.7 % — ABNORMAL HIGH (ref 0.0–0.2)

## 2020-02-03 LAB — RESPIRATORY PANEL BY RT PCR (FLU A&B, COVID)
Influenza A by PCR: NEGATIVE
Influenza B by PCR: NEGATIVE
SARS Coronavirus 2 by RT PCR: NEGATIVE

## 2020-02-03 MED ORDER — ACETAMINOPHEN 325 MG PO TABS
650.0000 mg | ORAL_TABLET | Freq: Four times a day (QID) | ORAL | Status: DC | PRN
  Administered 2020-02-03 – 2020-02-07 (×5): 650 mg via ORAL
  Filled 2020-02-03 (×4): qty 2

## 2020-02-03 MED ORDER — ACETAMINOPHEN 650 MG RE SUPP: 650.0000 mg | Freq: Four times a day (QID) | RECTAL | Status: DC | PRN

## 2020-02-03 MED ORDER — ALLOPURINOL 100 MG PO TABS
50.0000 mg | ORAL_TABLET | Freq: Every day | ORAL | Status: DC
  Administered 2020-02-04 – 2020-02-10 (×7): 50 mg via ORAL
  Filled 2020-02-03 (×7): qty 1

## 2020-02-03 MED ORDER — SODIUM CHLORIDE 0.9 % IV SOLN: INTRAVENOUS | Status: AC

## 2020-02-03 MED ORDER — DIVALPROEX SODIUM 250 MG PO DR TAB
500.0000 mg | DELAYED_RELEASE_TABLET | Freq: Two times a day (BID) | ORAL | Status: DC
  Administered 2020-02-03 – 2020-02-10 (×14): 500 mg via ORAL
  Filled 2020-02-03 (×14): qty 2

## 2020-02-03 MED ORDER — FAMOTIDINE 20 MG PO TABS
20.0000 mg | ORAL_TABLET | Freq: Every day | ORAL | Status: DC | PRN
  Administered 2020-02-05 – 2020-02-06 (×2): 20 mg via ORAL
  Filled 2020-02-03 (×2): qty 1

## 2020-02-03 MED ORDER — FAMOTIDINE 20 MG PO TABS: 40.0000 mg | ORAL_TABLET | Freq: Every day | ORAL | Status: DC | PRN

## 2020-02-03 MED ORDER — ONDANSETRON HCL 4 MG/2ML IJ SOLN
4.0000 mg | Freq: Four times a day (QID) | INTRAMUSCULAR | Status: DC | PRN
  Administered 2020-02-05: 4 mg via INTRAVENOUS
  Filled 2020-02-03: qty 2

## 2020-02-03 MED ORDER — LEVETIRACETAM ER 500 MG PO TB24
2000.0000 mg | ORAL_TABLET | Freq: Every day | ORAL | Status: DC
  Administered 2020-02-03 – 2020-02-09 (×7): 2000 mg via ORAL
  Filled 2020-02-03 (×9): qty 4

## 2020-02-03 MED ORDER — ASPIRIN 81 MG PO CHEW
81.0000 mg | CHEWABLE_TABLET | Freq: Every day | ORAL | Status: DC
  Administered 2020-02-03 – 2020-02-10 (×8): 81 mg via ORAL
  Filled 2020-02-03 (×9): qty 1

## 2020-02-03 MED ORDER — ONDANSETRON HCL 4 MG PO TABS
4.0000 mg | ORAL_TABLET | Freq: Four times a day (QID) | ORAL | Status: DC | PRN
  Administered 2020-02-05: 4 mg via ORAL
  Filled 2020-02-03: qty 1

## 2020-02-03 MED ORDER — MONTELUKAST SODIUM 10 MG PO TABS
10.0000 mg | ORAL_TABLET | Freq: Every day | ORAL | Status: DC
  Administered 2020-02-03 – 2020-02-09 (×7): 10 mg via ORAL
  Filled 2020-02-03 (×7): qty 1

## 2020-02-03 MED ORDER — SODIUM CHLORIDE 0.9 % IV BOLUS
500.0000 mL | Freq: Once | INTRAVENOUS | Status: AC
  Administered 2020-02-03: 500 mL via INTRAVENOUS

## 2020-02-03 MED ORDER — POLYETHYLENE GLYCOL 3350 17 G PO PACK
17.0000 g | PACK | Freq: Every day | ORAL | Status: DC | PRN
  Administered 2020-02-05: 17 g via ORAL
  Filled 2020-02-03: qty 1

## 2020-02-03 MED ORDER — CEFTRIAXONE SODIUM 1 G IJ SOLR
1.0000 g | INTRAMUSCULAR | Status: DC
Start: 2020-02-04 — End: 2020-02-08
  Administered 2020-02-04 – 2020-02-07 (×4): 1 g via INTRAVENOUS
  Filled 2020-02-03 (×4): qty 10

## 2020-02-03 MED ORDER — METOPROLOL TARTRATE 25 MG PO TABS
25.0000 mg | ORAL_TABLET | Freq: Two times a day (BID) | ORAL | Status: DC
  Administered 2020-02-03 – 2020-02-04 (×2): 25 mg via ORAL
  Filled 2020-02-03 (×2): qty 1

## 2020-02-03 MED ORDER — LEVOFLOXACIN IN D5W 750 MG/150ML IV SOLN
750.0000 mg | Freq: Once | INTRAVENOUS | Status: AC
  Administered 2020-02-03: 750 mg via INTRAVENOUS
  Filled 2020-02-03: qty 150

## 2020-02-03 MED ORDER — DILTIAZEM HCL ER COATED BEADS 120 MG PO CP24
120.0000 mg | ORAL_CAPSULE | Freq: Every day | ORAL | Status: DC
  Administered 2020-02-03 – 2020-02-04 (×2): 120 mg via ORAL
  Filled 2020-02-03: qty 1

## 2020-02-03 MED ORDER — HYDROCODONE-ACETAMINOPHEN 5-325 MG PO TABS
1.0000 | ORAL_TABLET | Freq: Once | ORAL | Status: AC
  Administered 2020-02-03: 1 via ORAL
  Filled 2020-02-03: qty 1

## 2020-02-03 MED ORDER — LORATADINE 10 MG PO TABS
10.0000 mg | ORAL_TABLET | Freq: Every day | ORAL | Status: DC
  Administered 2020-02-05 – 2020-02-10 (×6): 10 mg via ORAL
  Filled 2020-02-03 (×7): qty 1

## 2020-02-03 MED ORDER — SODIUM CHLORIDE 0.9 % IV SOLN: 500.0000 mg | INTRAVENOUS | Status: DC

## 2020-02-03 MED ORDER — HEPARIN SODIUM (PORCINE) 5000 UNIT/ML IJ SOLN
5000.0000 [IU] | Freq: Three times a day (TID) | INTRAMUSCULAR | Status: DC
  Administered 2020-02-03 – 2020-02-10 (×16): 5000 [IU] via SUBCUTANEOUS
  Filled 2020-02-03 (×18): qty 1

## 2020-02-03 NOTE — H&P (Addendum)
History and Physical    [name] TDH:[id number] DOB: Oct 30, 1923 DOA: 02/03/2020  PCP: [name], [name]   Patient coming from: Home  I have personally briefly reviewed patient's old medical records in [place]  Chief Complaint: Increased heart rate.  HPI: [name] is a 86 y.o. female with medical history significant for hypertension, CKD 4, seizures, atrial fibrillation, cognitive impairment. Patient was brought to the ED today with reports of increased heart rate up to 160s.  Patient's physical therapist was at home with the patient and noticed her son called EMS.  EMS gave 22 mg of Cardizem.  On my evaluation, patient denies cough or difficulty breathing, denies fevers, or feeling ill, she tells me she has some back pain.  She denies vomiting, no loose stools, no abdominal pain.  Patient answering some questions, unsure how reliable her history is, as patient speech is tangential, she has cognitive impairment, also documented during prior hospitalization.  No family at bedside.  Unable to reach patient's daughter [name] by phone listed on demographics.  Recent hospitalization 9/25-9/28-atrial fibrillation with RVR, Cardizem and reduced dose of metoprolol.  Not started on anticoagulation due to frequent falls.  9/22 - 9/23-sepsis secondary to pneumonia, treated with Rocephin and Flagyl for presumed aspiration pneumonia.  ED Course: Temperature 97.9.  Heart rate 90s.  Blood pressure systolic 64-680.  O2 sats greater than 95% on room air.  WBC 23.4.  Sodium 132.  Potassium 3.7.  Platelets 478.  Two-view chest x-ray shows new interstitial opacity right lower lobe suspicious for pneumonia.  EKG shows sinus rhythm.  Patient was started on IV Levaquin.  Hospitalist to admit for pneumonia.  Review of Systems: As per HPI all other systems reviewed and negative.  Past Medical History:  Diagnosis Date  . Chronic low back pain   . Degenerative arthritis   . Eczema    . Edema   . Gout   . Headache   . Hiatal hernia   . Hypertension   . Internal hemorrhoids   . Neurogenic claudication ([place])   . Obesity   . Seizure disorder ([place])   . Seizures ([place])   . Stroke ([place])   . Urticaria     Past Surgical History:  Procedure Laterality Date  . ABDOMINAL HYSTERECTOMY    . APPENDECTOMY    . APPENDECTOMY    . bilateral knees    . cataracts    . CHOLECYSTECTOMY    . mammoplasty reduction    . NEUROPLASTY / TRANSPOSITION MEDIAN NERVE AT CARPAL TUNNEL BILATERAL    . RE-EXCISION OF BREAST LUMPECTOMY    . TUBAL LIGATION       reports that she has never smoked. She has never used smokeless tobacco. She reports that she does not drink alcohol and does not use drugs.  Allergies  Allergen Reactions  . Advicor [Niacin-Lovastatin Er] Other (See Comments)    Reaction unknown to daughter  . Caduet [Amlodipine-Atorvastatin] Other (See Comments)    Reaction unknown to daughter  . Codeine Other (See Comments)    Reaction unknown to daughter  . Dilantin [Phenytoin] Other (See Comments)    Reaction unknown to daughter  . Lescol [Fluvastatin] Other (See Comments)    Reaction unknown to daughter  . Pamelor [Nortriptyline] Other (See Comments)    Reaction unknown to daughter  . Penicillins Hives    Did it involve swelling of the face/tongue/throat, SOB, or low BP? Unk Did it involve sudden or severe  rash/hives, skin peeling, or any reaction on the inside of your mouth or nose? Unk Did you need to seek medical attention at a hospital or doctor's office? Unk When did it last happen? "it was a long time ago" If all above answers are "NO", may proceed with cephalosporin use.   . Sulfa Antibiotics Hives  . Trileptal [Oxcarbazepine] Other (See Comments)    Reaction unknown to daughter  . Ultram [Tramadol] Other (See Comments)    Reaction unknown to daughter  . Welchol [Colesevelam] Other (See Comments)    Reaction unknown to daughter  . Latex Rash  . Tomato  Rash    Family History  Problem Relation Age of Onset  . Diabetes Mother   . Heart disease Mother   . Heart disease Father   . Cancer Sister   . Cancer Brother   . Cancer Sister     Prior to Admission medications   Medication Sig Start Date End Date Taking? Authorizing Provider  acetaminophen (TYLENOL) 500 MG tablet Take 500-1,000 mg by mouth every 8 (eight) hours as needed for mild pain or headache.    Yes [provider]  allopurinol (ZYLOPRIM) 100 MG tablet TAKE 1/2 TABLET BY MOUTH EVERY DAY Patient taking differently: Take 50 mg by mouth daily.  09/24/19  Yes Hawks, Christy A, [name]  aspirin 81 MG tablet Take 81 mg by mouth daily.   Yes [provider]  cetirizine (ZYRTEC) 10 MG tablet TAKE 1 TABLET BY MOUTH EVERY DAY Patient taking differently: Take 10 mg by mouth daily.  09/25/19  Yes Hawks, Christy A, [name]  diltiazem (CARDIZEM CD) 120 MG 24 hr capsule Take 1 capsule (120 mg total) by mouth daily. 01/12/20  Yes Tat, Shanon Brow, MD  divalproex (DEPAKOTE) 250 MG DR tablet TAKE ONE TABLET BY MOUTH TWICE A DAY FOR 2 WEEKS, THEN TAKE 2 TABLETS TWICE A DAY THEREAFTER Patient taking differently: Take 500 mg by mouth 2 (two) times daily.  10/20/19  Yes Kathrynn Ducking, MD  famotidine (PEPCID) 40 MG tablet Take 1 tablet (40 mg total) by mouth daily. Patient taking differently: Take 40 mg by mouth daily as needed for heartburn.  04/08/19  Yes Kozlow, Donnamarie Poag, MD  Fluticasone Furoate (ARNUITY ELLIPTA) 200 MCG/ACT AEPB INHALE 1 PUFF BY MOUTH EVERY DAY Patient taking differently: Take 1 puff by mouth daily.  12/03/19  Yes Kozlow, Donnamarie Poag, MD  ketoconazole (NIZORAL) 2 % cream Apply to the affected areas underneath breasts, belly and in groin region ONCE daily x2-4 weeks. Patient taking differently: Apply 1 application topically See admin instructions. Apply to the affected areas underneath breasts, belly and in groin region ONCE daily for 2-4 weeks 09/16/18  Yes Gottschalk, Ashly M, DO    levETIRAcetam (KEPPRA XR) 500 MG 24 hr tablet Take 4 tablets (2,000 mg total) by mouth at bedtime. 11/10/18  Yes Suzzanne Cloud, NP  metolazone (ZAROXOLYN) 2.5 MG tablet Take 1 Tablet by mouth on Mondays, Wednesdays and Fridays 08/17/19  Yes Hawks, [place] A, [name]  metoprolol tartrate (LOPRESSOR) 25 MG tablet Take 1 tablet (25 mg total) by mouth 2 (two) times daily. 01/12/20  Yes Tat, Shanon Brow, MD  mometasone (ELOCON) 0.1 % ointment Apply to skin once daily as directed after bathing. 03/04/18  Yes Kozlow, Donnamarie Poag, MD  montelukast (SINGULAIR) 10 MG tablet TAKE 1 TABLET BY MOUTH EVERYDAY AT BEDTIME Patient taking differently: Take 10 mg by mouth at bedtime.  12/03/19  Yes Kozlow, Donnamarie Poag, MD  Multiple Vitamins-Minerals (ONE-A-DAY WOMENS 50 PLUS) TABS Take 1 tablet by mouth daily with breakfast.   Yes [provider]  nystatin-triamcinolone ointment (MYCOLOG) Apply 1 application topically 2 (two) times daily. 11/16/15  Yes Hawks, Christy A, [name]  omeprazole (PRILOSEC) 40 MG capsule Take 1 capsule (40 mg total) by mouth daily. Patient taking differently: Take 40 mg by mouth daily as needed (for reflux).  07/03/18  Yes Kozlow, Donnamarie Poag, MD  triamcinolone cream (KENALOG) 0.1 % Apply 1 application topically 2 (two) times daily. Patient taking differently: Apply 1 application topically 2 (two) times daily as needed (for eczema).  03/18/18  Yes Kozlow, Donnamarie Poag, MD  nystatin (MYCOSTATIN) 100000 UNIT/ML suspension Take 5 mLs (500,000 Units total) by mouth 4 (four) times daily. Patient not taking: Reported on 02/03/2020 12/28/19   Dettinger, Fransisca Kaufmann, MD    Physical Exam: Vitals:   02/03/20 1534 02/03/20 1718 02/03/20 1730 02/03/20 1830  BP: (!) 97/55 126/66 109/65 123/73  Pulse: 94 91    Resp: 18 (!) 22 (!) 28 20  Temp: 97.9 F (36.6 C) 97.9 F (36.6 C)    TempSrc: Oral Oral    SpO2:  95%    Weight:      Height:        Constitutional: NAD, calm, comfortable Vitals:   02/03/20 1534 02/03/20 1718 02/03/20  1730 02/03/20 1830  BP: (!) 97/55 126/66 109/65 123/73  Pulse: 94 91    Resp: 18 (!) 22 (!) 28 20  Temp: 97.9 F (36.6 C) 97.9 F (36.6 C)    TempSrc: Oral Oral    SpO2:  95%    Weight:      Height:       Eyes: PERRL, lids and conjunctivae normal ENMT: Mucous membranes are dry Neck: normal, supple, no masses, no thyromegaly Respiratory: clear to auscultation bilaterally, no wheezing, no crackles. Normal respiratory effort. No accessory muscle use.  Cardiovascular: Regular rate and rhythm, no murmurs / rubs / gallops. No extremity edema. 2+ pedal pulses. No carotid bruits.   Abdomen: no tenderness, no masses palpated. No hepatosplenomegaly. Bowel sounds positive.  Musculoskeletal: no clubbing / cyanosis. No joint deformity upper and lower extremities. Good ROM, no contractures. Normal muscle tone.  Skin: no rashes, lesions, ulcers. No induration Neurologic: No apparent cranial nerve abnormality, 5/5 strength bilateral upper extremities, able to move lower extremity against gravity. Psychiatric: Speech tangential, alert, but she is unable to tell me why she is in the hospital.  Labs on Admission: I have personally reviewed following labs and imaging studies  CBC: Recent Labs  Lab 02/03/20 1550  WBC 23.4*  HGB 8.7*  HCT 27.3*  MCV 71.8*  PLT 509*   Basic Metabolic Panel: Recent Labs  Lab 02/03/20 1550  NA 132*  K 5.7*  CL 101  CO2 21*  GLUCOSE 116*  BUN 47*  CREATININE 1.94*  CALCIUM 9.3    Radiological Exams on Admission: DG Chest 2 View  Result Date: 02/03/2020 CLINICAL DATA:  Atrial fibrillation EXAM: CHEST - 2 VIEW COMPARISON:  01/09/2020 FINDINGS: Stable cardiomediastinal contours. New interstitial opacity within the right lower lobe. No pleural effusion. No pneumothorax. IMPRESSION: New interstitial opacity within the right lower lobe, suspicious for pneumonia. Electronically Signed   By: Davina Poke D.O.   On: 02/03/2020 16:26    EKG: Independently  reviewed.  Sinus rhythm, rate 90, QTC 429.  No significant ST-T wave changes from prior.  Assessment/Plan Principal Problem:   PNA (pneumonia) Active Problems:  Seizure disorder ([place])   Essential hypertension, benign   Chronic kidney disease (CKD), stage IV (severe) (HCC)   A-fib ([place])   Pneumonia-chest x-ray shows new right base opacity.  She denies respiratory complaints, fever or malaise-unable to collaborate her history, and I am unsure how reliable she is as a historian.  But she has a leukocytosis of 23.4 (chronically elevated ~ 16).  O2 sats greater than 95% on room air.  Recently she denies urinary complaints. Ruless out for sepsis.  Considering advanced age, worsening leukocytosis.  She denies dysphagia. -IV Levaquin started in ED (penicillin allergy), tolerated cephalosporins in the past, will continue with ceftriaxone and azithromycin -CBC BMP in the morning -Speech therapy evaluation  AKI on CKD-creatinine 1.94, recent baseline 1.2-1.5.  Patient appears dehydrated. -IV fluids. N/s 75cc/hr x 20hrs -BMP in the morning  Atrial fibrillation-currently in sinus rhythm, reported heart rate up to 160, 22 mg of Cardizem given by EMS, converted to sinus rhythm.  On metoprolol and Cardizem.  Not on anticoagulation due to history of frequent falls. -Resume metoprolol and Cardizem. -Resume aspirin  History of seizures -Resume Keppra and Depakote  Hypertension-blood pressure soft, likely from dehydration. -Resume Cardizem and metoprolol for rate control  Gout -Resume allopurinol  DVT prophylaxis: Heparin Code Status: Full code Family Communication: I am unable to reach daughter [name], listed on demographics Disposition Plan: ~ 1 - 2 days Consults called: none Admission status:  Obs tele   Bethena Roys MD [facility]  02/03/2020, 8:57 PM

## 2020-02-03 NOTE — ED Triage Notes (Addendum)
Pt brought to ED via [facility] for a fib with RVR. Pt was in PT today at home and PT noticed pt HR in the 160's called EMS, EMS gave Cardizem 22 mg.

## 2020-02-03 NOTE — ED Provider Notes (Signed)
[facility] EMERGENCY DEPARTMENT Provider Note   CSN: [id number] Arrival date & time: 02/03/20  1523     History Chief Complaint  Patient presents with  . Atrial Fibrillation    [name] is a 86 y.o. female.  Patient was seen by physical therapy today and it was found that her heart rate was 160 and atrial fib.  She was picked up by paramedics and given Cardizem and she converted into a normal sinus rhythm.  Patient complains of some weakness and upper back discomfort  The history is provided by the patient and medical records. No language interpreter was used.  Weakness Severity:  Moderate Onset quality:  Sudden Timing:  Constant Progression:  Waxing and waning Chronicity:  New Context: not alcohol use   Relieved by:  Nothing Worsened by:  Nothing Ineffective treatments:  None tried Associated symptoms: no abdominal pain, no chest pain, no cough, no diarrhea, no frequency, no headaches and no seizures   Risk factors: no anemia        Past Medical History:  Diagnosis Date  . Chronic low back pain   . Degenerative arthritis   . Eczema   . Edema   . Gout   . Headache   . Hiatal hernia   . Hypertension   . Internal hemorrhoids   . Neurogenic claudication ([place])   . Obesity   . Seizure disorder ([place])   . Seizures ([place])   . Stroke ([place])   . Urticaria     Patient Active Problem List   Diagnosis Date Noted  . PNA (pneumonia) 02/03/2020  . Atrial fibrillation with RVR ([place]) 01/10/2020  . A-fib ([place]) 01/09/2020  . Pneumonia 01/04/2020  . Class 2 obesity 01/04/2020  . CKD (chronic kidney disease) stage 3, GFR 30-59 ml/min (HCC) 01/04/2020  . Nausea vomiting and diarrhea 03/12/2019  . Lung nodules 03/12/2019  . Atypical chest pain 12/31/2018  . Asthma 02/12/2017  . Chest pain 11/13/2016  . History of seizures 11/13/2016  . History of stroke 11/13/2016  . Allergic urticaria 08/30/2016  . GAD (generalized anxiety disorder) 08/30/2016  . Gout 08/30/2016    . Acute renal failure superimposed on stage 3 chronic kidney disease ([place]) 10/11/2015  . Neurogenic claudication ([place])   . Morbid obesity ([place])   . Edema   . Essential hypertension, benign 09/30/2014  . Secondary hyperparathyroidism, renal ([place]) 09/30/2014  . Proteinuria 09/30/2014  . Anemia associated with chronic renal failure 09/30/2014  . Chronic kidney disease (CKD), stage IV (severe) ([place]) 09/30/2014  . Hiatal hernia 08/21/2012  . Seizure disorder ([place]) 08/21/2012    Past Surgical History:  Procedure Laterality Date  . ABDOMINAL HYSTERECTOMY    . APPENDECTOMY    . APPENDECTOMY    . bilateral knees    . cataracts    . CHOLECYSTECTOMY    . mammoplasty reduction    . NEUROPLASTY / TRANSPOSITION MEDIAN NERVE AT CARPAL TUNNEL BILATERAL    . RE-EXCISION OF BREAST LUMPECTOMY    . TUBAL LIGATION       OB History    Gravida  6   Para  4   Term  4   Preterm      AB  2   Living        SAB  2   TAB      Ectopic      Multiple      Live Births  Family History  Problem Relation Age of Onset  . Diabetes Mother   . Heart disease Mother   . Heart disease Father   . Cancer Sister   . Cancer Brother   . Cancer Sister     Social History   Tobacco Use  . Smoking status: Never Smoker  . Smokeless tobacco: Never Used  Vaping Use  . Vaping Use: Never used  Substance Use Topics  . Alcohol use: No  . Drug use: No    Home Medications Prior to Admission medications   Medication Sig Start Date End Date Taking? Authorizing Provider  acetaminophen (TYLENOL) 500 MG tablet Take 500-1,000 mg by mouth every 8 (eight) hours as needed for mild pain or headache.     [provider]  allopurinol (ZYLOPRIM) 100 MG tablet TAKE 1/2 TABLET BY MOUTH EVERY DAY Patient taking differently: Take 50 mg by mouth daily.  09/24/19   [name] A, [name]  aspirin 81 MG tablet Take 81 mg by mouth daily.    [provider]  cetirizine (ZYRTEC) 10 MG  tablet TAKE 1 TABLET BY MOUTH EVERY DAY Patient taking differently: Take 10 mg by mouth daily.  09/25/19   [name], [name]  diltiazem (CARDIZEM CD) 120 MG 24 hr capsule Take 1 capsule (120 mg total) by mouth daily. 01/12/20   Orson Eva, MD  divalproex (DEPAKOTE) 250 MG DR tablet TAKE ONE TABLET BY MOUTH TWICE A DAY FOR 2 WEEKS, THEN TAKE 2 TABLETS TWICE A DAY THEREAFTER Patient taking differently: Take 500 mg by mouth 2 (two) times daily.  10/20/19   Kathrynn Ducking, MD  famotidine (PEPCID) 40 MG tablet Take 1 tablet (40 mg total) by mouth daily. Patient taking differently: Take 40 mg by mouth daily as needed for heartburn.  04/08/19   Kozlow, Donnamarie Poag, MD  Fluticasone Furoate (ARNUITY ELLIPTA) 200 MCG/ACT AEPB INHALE 1 PUFF BY MOUTH EVERY DAY Patient taking differently: Take 1 puff by mouth daily.  12/03/19   Kozlow, Donnamarie Poag, MD  ketoconazole (NIZORAL) 2 % cream Apply to the affected areas underneath breasts, belly and in groin region ONCE daily x2-4 weeks. Patient taking differently: Apply 1 application topically See admin instructions. Apply to the affected areas underneath breasts, belly and in groin region ONCE daily for 2-4 weeks 09/16/18   Ronnie Doss M, DO  levETIRAcetam (KEPPRA XR) 500 MG 24 hr tablet Take 4 tablets (2,000 mg total) by mouth at bedtime. 11/10/18   Suzzanne Cloud, NP  metolazone (ZAROXOLYN) 2.5 MG tablet Take 1 Tablet by mouth on Mondays, Wednesdays and Fridays 08/17/19   [name] A, [name]  metoprolol tartrate (LOPRESSOR) 25 MG tablet Take 1 tablet (25 mg total) by mouth 2 (two) times daily. 01/12/20   Orson Eva, MD  mometasone (ELOCON) 0.1 % ointment Apply to skin once daily as directed after bathing. 03/04/18   Kozlow, Donnamarie Poag, MD  montelukast (SINGULAIR) 10 MG tablet TAKE 1 TABLET BY MOUTH EVERYDAY AT BEDTIME Patient taking differently: Take 10 mg by mouth at bedtime.  12/03/19   Kozlow, Donnamarie Poag, MD  Multiple Vitamins-Minerals (ONE-A-DAY WOMENS 50 PLUS) TABS Take 1  tablet by mouth daily with breakfast.    [provider]  nystatin (MYCOSTATIN) 100000 UNIT/ML suspension Take 5 mLs (500,000 Units total) by mouth 4 (four) times daily. 12/28/19   Dettinger, Fransisca Kaufmann, MD  nystatin-triamcinolone ointment [facility]) Apply 1 application topically 2 (two) times daily. 11/16/15   [name], [name]  omeprazole (PRILOSEC) 40 MG capsule Take 1 capsule (40 mg total) by mouth daily. Patient taking differently: Take 40 mg by mouth daily as needed (for reflux).  07/03/18   Kozlow, Donnamarie Poag, MD  triamcinolone cream (KENALOG) 0.1 % Apply 1 application topically 2 (two) times daily. Patient taking differently: Apply 1 application topically 2 (two) times daily as needed (for eczema).  03/18/18   Kozlow, Donnamarie Poag, MD    Allergies    Advicor [niacin-lovastatin er], Caduet [amlodipine-atorvastatin], Codeine, Dilantin [phenytoin], Lescol [fluvastatin], Pamelor [nortriptyline], Penicillins, Sulfa antibiotics, Trileptal [oxcarbazepine], Ultram [tramadol], Welchol [colesevelam], Latex, and Tomato  Review of Systems   Review of Systems  Constitutional: Negative for appetite change and fatigue.  HENT: Negative for congestion, ear discharge and sinus pressure.   Eyes: Negative for discharge.  Respiratory: Negative for cough.   Cardiovascular: Negative for chest pain.  Gastrointestinal: Negative for abdominal pain and diarrhea.  Genitourinary: Negative for frequency and hematuria.  Musculoskeletal: Negative for back pain.  Skin: Negative for rash.  Neurological: Positive for weakness. Negative for seizures and headaches.  Psychiatric/Behavioral: Negative for hallucinations.    Physical Exam Updated Vital Signs BP 109/65   Pulse 91   Temp 97.9 F (36.6 C) (Oral)   Resp (!) 28   Ht 5\' 6"  (1.676 m)   Wt 90.7 kg   SpO2 95%   BMI 32.28 kg/m   Physical Exam Vitals and nursing note reviewed.  Constitutional:      Appearance: She is well-developed.  HENT:     Head:  Normocephalic.     Nose: Nose normal.  Eyes:     General: No scleral icterus.    Conjunctiva/sclera: Conjunctivae normal.  Neck:     Thyroid: No thyromegaly.  Cardiovascular:     Rate and Rhythm: Normal rate and regular rhythm.     Heart sounds: No murmur heard.  No friction rub. No gallop.   Pulmonary:     Breath sounds: No stridor. No wheezing or rales.  Chest:     Chest wall: No tenderness.  Abdominal:     General: There is no distension.     Tenderness: There is no abdominal tenderness. There is no rebound.  Musculoskeletal:        General: Normal range of motion.     Cervical back: Neck supple.  Lymphadenopathy:     Cervical: No cervical adenopathy.  Skin:    Findings: No erythema or rash.  Neurological:     Mental Status: She is alert and oriented to person, place, and time.     Motor: No abnormal muscle tone.     Coordination: Coordination normal.  Psychiatric:        Behavior: Behavior normal.     ED Results / Procedures / Treatments   Labs (all labs ordered are listed, but only abnormal results are displayed) Labs Reviewed  BASIC METABOLIC PANEL - Abnormal; Notable for the following components:      Result Value   Sodium 132 (*)    Potassium 5.7 (*)    CO2 21 (*)    Glucose, Bld 116 (*)    BUN 47 (*)    Creatinine, Ser 1.94 (*)    GFR, Estimated 22 (*)    All other components within normal limits  CBC - Abnormal; Notable for the following components:   WBC 23.4 (*)    RBC 3.80 (*)    Hemoglobin 8.7 (*)    HCT 27.3 (*)    MCV 71.8 (*)  MCH 22.9 (*)    RDW 22.5 (*)    Platelets 478 (*)    nRBC 5.7 (*)    All other components within normal limits  RESPIRATORY PANEL BY RT PCR (FLU A&B, COVID)    EKG EKG Interpretation  Date/Time:  Wednesday February 03 2020 15:38:21 EDT Ventricular Rate:  90 PR Interval:    QRS Duration: 93 QT Interval:  350 QTC Calculation: 429 R Axis:   -29 Text Interpretation: Sinus rhythm Borderline left axis deviation  Low voltage, precordial leads Confirmed by Milton Ferguson [phone]) on 02/03/2020 5:03:11 PM   Radiology DG Chest 2 View  Result Date: 02/03/2020 CLINICAL DATA:  Atrial fibrillation EXAM: CHEST - 2 VIEW COMPARISON:  01/09/2020 FINDINGS: Stable cardiomediastinal contours. New interstitial opacity within the right lower lobe. No pleural effusion. No pneumothorax. IMPRESSION: New interstitial opacity within the right lower lobe, suspicious for pneumonia. Electronically Signed   By: Davina Poke D.O.   On: 02/03/2020 16:26    Procedures Procedures (including critical care time)  Medications Ordered in ED Medications  levofloxacin (LEVAQUIN) IVPB 750 mg (750 mg Intravenous New Bag/Given 02/03/20 1746)  sodium chloride 0.9 % bolus 500 mL (0 mLs Intravenous Stopped 02/03/20 1657)  HYDROcodone-acetaminophen (NORCO/VICODIN) 5-325 MG per tablet 1 tablet (1 tablet Oral Given 02/03/20 1621)    ED Course  I have reviewed the triage vital signs and the nursing notes.  Pertinent labs & imaging results that were available during my care of the patient were reviewed by me and considered in my medical decision making (see chart for details).    MDM Rules/Calculators/A&P                          Patient has pneumonia on chest x-ray with leukocytosis.  She will be admitted for pneumonia by the hospitalist       This patient presents to the ED for concern of weakness, this involves an extensive number of treatment options, and is a complaint that carries with it a high risk of complications and morbidity.  The differential diagnosis includes anemia   Lab Tests:   I Ordered, reviewed, and interpreted labs, which included CBC and chemistries which show elevated potassium along with renal insufficiency and elevated white count  Medicines ordered:    I ordered medication Levaquin for pneumonia  Imaging Studies ordered:   I ordered imaging studies which included chest x-ray  I  independently visualized and interpreted imaging which showed pneumonia  Additional history obtained:   Additional history obtained from record  Previous records obtained and reviewed.  Consultations Obtained:   I consulted hospitalist and discussed lab and imaging findings  Reevaluation:  After the interventions stated above, I reevaluated the patient and found no change  Critical Interventions:  .   Final Clinical Impression(s) / ED Diagnoses Final diagnoses:  Community acquired pneumonia of right lower lobe of lung    Rx / DC Orders ED Discharge Orders    None       Milton Ferguson, MD 02/03/20 [phone]

## 2020-02-04 ENCOUNTER — Other Ambulatory Visit: Payer: Self-pay | Admitting: Neurology

## 2020-02-04 DIAGNOSIS — D631 Anemia in chronic kidney disease: Secondary | ICD-10-CM | POA: Diagnosis present

## 2020-02-04 DIAGNOSIS — I480 Paroxysmal atrial fibrillation: Secondary | ICD-10-CM

## 2020-02-04 DIAGNOSIS — E860 Dehydration: Secondary | ICD-10-CM | POA: Diagnosis present

## 2020-02-04 DIAGNOSIS — E875 Hyperkalemia: Secondary | ICD-10-CM | POA: Diagnosis present

## 2020-02-04 DIAGNOSIS — I10 Essential (primary) hypertension: Secondary | ICD-10-CM | POA: Diagnosis not present

## 2020-02-04 DIAGNOSIS — N184 Chronic kidney disease, stage 4 (severe): Secondary | ICD-10-CM

## 2020-02-04 DIAGNOSIS — I129 Hypertensive chronic kidney disease with stage 1 through stage 4 chronic kidney disease, or unspecified chronic kidney disease: Secondary | ICD-10-CM | POA: Diagnosis present

## 2020-02-04 DIAGNOSIS — N183 Chronic kidney disease, stage 3 unspecified: Secondary | ICD-10-CM

## 2020-02-04 DIAGNOSIS — E785 Hyperlipidemia, unspecified: Secondary | ICD-10-CM | POA: Diagnosis present

## 2020-02-04 DIAGNOSIS — R296 Repeated falls: Secondary | ICD-10-CM | POA: Diagnosis present

## 2020-02-04 DIAGNOSIS — G40909 Epilepsy, unspecified, not intractable, without status epilepticus: Secondary | ICD-10-CM | POA: Diagnosis not present

## 2020-02-04 DIAGNOSIS — I472 Ventricular tachycardia: Secondary | ICD-10-CM | POA: Diagnosis present

## 2020-02-04 DIAGNOSIS — I959 Hypotension, unspecified: Secondary | ICD-10-CM | POA: Diagnosis present

## 2020-02-04 DIAGNOSIS — I69319 Unspecified symptoms and signs involving cognitive functions following cerebral infarction: Secondary | ICD-10-CM | POA: Diagnosis not present

## 2020-02-04 DIAGNOSIS — Z515 Encounter for palliative care: Secondary | ICD-10-CM

## 2020-02-04 DIAGNOSIS — M109 Gout, unspecified: Secondary | ICD-10-CM | POA: Diagnosis present

## 2020-02-04 DIAGNOSIS — M79602 Pain in left arm: Secondary | ICD-10-CM | POA: Diagnosis present

## 2020-02-04 DIAGNOSIS — I471 Supraventricular tachycardia: Secondary | ICD-10-CM | POA: Diagnosis present

## 2020-02-04 DIAGNOSIS — Z7189 Other specified counseling: Secondary | ICD-10-CM

## 2020-02-04 DIAGNOSIS — J189 Pneumonia, unspecified organism: Secondary | ICD-10-CM

## 2020-02-04 DIAGNOSIS — I493 Ventricular premature depolarization: Secondary | ICD-10-CM | POA: Diagnosis present

## 2020-02-04 DIAGNOSIS — N1832 Chronic kidney disease, stage 3b: Secondary | ICD-10-CM | POA: Diagnosis present

## 2020-02-04 DIAGNOSIS — I4892 Unspecified atrial flutter: Secondary | ICD-10-CM | POA: Diagnosis present

## 2020-02-04 DIAGNOSIS — N179 Acute kidney failure, unspecified: Secondary | ICD-10-CM | POA: Diagnosis present

## 2020-02-04 DIAGNOSIS — I4891 Unspecified atrial fibrillation: Secondary | ICD-10-CM | POA: Diagnosis present

## 2020-02-04 DIAGNOSIS — Z20822 Contact with and (suspected) exposure to covid-19: Secondary | ICD-10-CM | POA: Diagnosis present

## 2020-02-04 DIAGNOSIS — E871 Hypo-osmolality and hyponatremia: Secondary | ICD-10-CM | POA: Diagnosis present

## 2020-02-04 DIAGNOSIS — R569 Unspecified convulsions: Secondary | ICD-10-CM | POA: Diagnosis present

## 2020-02-04 DIAGNOSIS — F0390 Unspecified dementia without behavioral disturbance: Secondary | ICD-10-CM | POA: Diagnosis present

## 2020-02-04 LAB — CBC
HCT: 26.6 % — ABNORMAL LOW (ref 36.0–46.0)
Hemoglobin: 8.3 g/dL — ABNORMAL LOW (ref 12.0–15.0)
MCH: 22.7 pg — ABNORMAL LOW (ref 26.0–34.0)
MCHC: 31.2 g/dL (ref 30.0–36.0)
MCV: 72.9 fL — ABNORMAL LOW (ref 80.0–100.0)
Platelets: 523 10*3/uL — ABNORMAL HIGH (ref 150–400)
RBC: 3.65 MIL/uL — ABNORMAL LOW (ref 3.87–5.11)
RDW: 21.6 % — ABNORMAL HIGH (ref 11.5–15.5)
WBC: 24.8 10*3/uL — ABNORMAL HIGH (ref 4.0–10.5)
nRBC: 4.1 % — ABNORMAL HIGH (ref 0.0–0.2)

## 2020-02-04 LAB — MRSA PCR SCREENING: MRSA by PCR: NEGATIVE

## 2020-02-04 LAB — BASIC METABOLIC PANEL
Anion gap: 13 (ref 5–15)
BUN: 46 mg/dL — ABNORMAL HIGH (ref 8–23)
CO2: 23 mmol/L (ref 22–32)
Calcium: 9.6 mg/dL (ref 8.9–10.3)
Chloride: 100 mmol/L (ref 98–111)
Creatinine, Ser: 1.68 mg/dL — ABNORMAL HIGH (ref 0.44–1.00)
GFR, Estimated: 26 mL/min — ABNORMAL LOW (ref >60)
Glucose, Bld: 91 mg/dL (ref 70–99)
Potassium: 3.9 mmol/L (ref 3.5–5.1)
Sodium: 136 mmol/L (ref 135–145)

## 2020-02-04 LAB — TROPONIN I (HIGH SENSITIVITY)
Troponin I (High Sensitivity): 15 ng/L (ref <18)
Troponin I (High Sensitivity): 12 ng/L (ref <18)

## 2020-02-04 MED ORDER — SODIUM CHLORIDE 0.9 % IV SOLN
100.0000 mg | Freq: Two times a day (BID) | INTRAVENOUS | Status: DC
  Administered 2020-02-04 – 2020-02-08 (×8): 100 mg via INTRAVENOUS
  Filled 2020-02-04 (×11): qty 100

## 2020-02-04 MED ORDER — NITROGLYCERIN 0.4 MG SL SUBL
SUBLINGUAL_TABLET | SUBLINGUAL | Status: AC
  Administered 2020-02-04: 0.4 mg
  Filled 2020-02-04: qty 1

## 2020-02-04 MED ORDER — NITROGLYCERIN 0.4 MG SL SUBL: 0.4000 mg | SUBLINGUAL_TABLET | SUBLINGUAL | Status: DC | PRN

## 2020-02-04 MED ORDER — ADENOSINE 6 MG/2ML IV SOLN
6.0000 mg | Freq: Once | INTRAVENOUS | Status: AC
  Administered 2020-02-04: 6 mg via INTRAVENOUS

## 2020-02-04 MED ORDER — AMIODARONE HCL IN DEXTROSE 360-4.14 MG/200ML-% IV SOLN
30.0000 mg/h | INTRAVENOUS | Status: DC
  Administered 2020-02-05: 30 mg/h via INTRAVENOUS
  Filled 2020-02-04: qty 200

## 2020-02-04 MED ORDER — DIGOXIN 0.25 MG/ML IJ SOLN
0.2500 mg | INTRAMUSCULAR | Status: AC
  Administered 2020-02-04: 0.25 mg via INTRAVENOUS

## 2020-02-04 MED ORDER — AMIODARONE HCL IN DEXTROSE 360-4.14 MG/200ML-% IV SOLN
60.0000 mg/h | INTRAVENOUS | Status: AC
  Administered 2020-02-04: 60 mg/h via INTRAVENOUS
  Filled 2020-02-04: qty 200

## 2020-02-04 MED ORDER — CHLORHEXIDINE GLUCONATE CLOTH 2 % EX PADS
6.0000 | MEDICATED_PAD | Freq: Every day | CUTANEOUS | Status: DC
  Administered 2020-02-04 – 2020-02-10 (×7): 6 via TOPICAL

## 2020-02-04 NOTE — Progress Notes (Signed)
PMT consult received and chart reviewed. Patient awake, alert but some pleasant confusion. No family at bedside. Spoke with daughter, [name] and son, Eddie Dibbles who is reported documented POA. He will bring paperwork to the hospital for EMR. No decisions made today. Continue full code/full scope treatment. Eddie Dibbles and [name] will further discuss code status/limitations to care this weekend with other two siblings. PMT provider off service at [facility] until Monday but will f/u at that time if patient still hospitalized.    Full palliative note to follow.  NO CHARGE  Ihor Dow, [place], [name]-C Palliative Medicine Team  Phone: [phone] Fax: [phone]

## 2020-02-04 NOTE — Progress Notes (Signed)
Called by rapid response team for patient with severe tachycardia and hypotension. EKG shows aflutter with RVR. Initial SBP reportedly in the 60s, at f/u SBP high 80s with MAP of 68. Patient is mentating well, does complain of some left sided chest pain/shoulder pain. History of aflutter during prior recent admission, has not been on anticoag due to frequent falls. Given MAPs are above 65, not on anticoag, mentating and sats stable would not proceed with urgent cardioversion. Start IV amio bolus, transfer to ICU.   Carlyle Dolly MD

## 2020-02-04 NOTE — Consult Note (Addendum)
Cardiology Consult    Patient ID: [name]; [id number]; 09/15/1923   Admit date: 02/03/2020 Date of Consult: 02/04/2020  Primary Care Provider: [name], [name] Primary Cardiologist: Fransico Him, MD   Patient Profile    [name] is a 86 y.o. female with past medical history of paroxysmal atrial fibrillation (not an anticoagulation candidate due to frequent falls), HTN, HLD, Stage 3 CKD and seizure disorder who is being seen today for the evaluation of atrial flutter with RVR at the request of Dr. Verlon Au and Rapid Response Team.   History of Present Illness    [name] has experienced multiple admissions over the past few months. Was admitted from 9/20 - 01/07/2020 for PNA and had episodes of SVT with Lopressor being titrated to 75mg  BID. Was again admitted from 9/25 - 01/11/2020 for atrial flutter with RVR. Initially required IV Cardizem and converted to NSR and was transitioned to Cardizem CD 120mg  daily at discharge. Although her CHA2DS2-VASc Score was elevated to 7, she was not started on anticoagulation due to her frequent falls and family members were aware of the increased thromboembolic risk.   She presented back to [facility] ED on 02/03/2020 and per EMS report was in atrial fibrillation with RVR and HR in the 160's upon arrival and she was given IV Cardizem and converted back to NSR. Initial labs showed WBC 23.4, Hgb 8.7, platelets 478, Na+ 136, K+ 3.9 and creatinine 1.68. CXR concerning for PNA. EKG showed NSR, HR 90 with LAD and no acute ST changes.   She was admitted for PNA and started on Ceftriaxone and Azithromycin.   This morning, a Rapid Response was called as she developed significant tachycardia with HR in the 160's to 180's. Was given Adenosine 6mg  with only transient improvement in her HR. Was also given Digoxin 0.25mg  and SL NTG due to reports of left-sided shoulder discomfort but rates remained elevated and she developed subsequent hypotension with  SBP in the 60's. Was made aware by a nurse from Rapid Response during the situation and contacted Dr. Harl Bowie as well. Was given IV Amiodarone 150mg  bolus and SBP improved into the 90's and HR into the 130's to 140's. Given improvement in BP and stable mentation, urgent DCCV was not pursued. She was transferred to the ICU and started on IV Amiodarone.     Past Medical History:  Diagnosis Date  . Chronic low back pain   . Degenerative arthritis   . Eczema   . Edema   . Gout   . Headache   . Hiatal hernia   . Hypertension   . Internal hemorrhoids   . Neurogenic claudication ([place])   . Obesity   . Seizure disorder ([place])   . Seizures ([place])   . Stroke ([place])   . Urticaria     Past Surgical History:  Procedure Laterality Date  . ABDOMINAL HYSTERECTOMY    . APPENDECTOMY    . APPENDECTOMY    . bilateral knees    . cataracts    . CHOLECYSTECTOMY    . mammoplasty reduction    . NEUROPLASTY / TRANSPOSITION MEDIAN NERVE AT CARPAL TUNNEL BILATERAL    . RE-EXCISION OF BREAST LUMPECTOMY    . TUBAL LIGATION       Home Medications:  Prior to Admission medications   Medication Sig Start Date End Date Taking? Authorizing Provider  acetaminophen (TYLENOL) 500 MG tablet Take 500-1,000 mg by mouth every 8 (eight) hours as needed for  mild pain or headache.    Yes [provider]  allopurinol (ZYLOPRIM) 100 MG tablet TAKE 1/2 TABLET BY MOUTH EVERY DAY Patient taking differently: Take 50 mg by mouth daily.  09/24/19  Yes Hawks, Christy A, [name]  aspirin 81 MG tablet Take 81 mg by mouth daily.   Yes [provider]  cetirizine (ZYRTEC) 10 MG tablet TAKE 1 TABLET BY MOUTH EVERY DAY Patient taking differently: Take 10 mg by mouth daily.  09/25/19  Yes Hawks, Christy A, [name]  diltiazem (CARDIZEM CD) 120 MG 24 hr capsule Take 1 capsule (120 mg total) by mouth daily. 01/12/20  Yes Tat, Shanon Brow, MD  divalproex (DEPAKOTE) 250 MG DR tablet TAKE ONE TABLET BY MOUTH TWICE A DAY FOR 2 WEEKS, THEN  TAKE 2 TABLETS TWICE A DAY THEREAFTER Patient taking differently: Take 500 mg by mouth 2 (two) times daily.  10/20/19  Yes Kathrynn Ducking, MD  famotidine (PEPCID) 40 MG tablet Take 1 tablet (40 mg total) by mouth daily. Patient taking differently: Take 40 mg by mouth daily as needed for heartburn.  04/08/19  Yes Kozlow, Donnamarie Poag, MD  Fluticasone Furoate (ARNUITY ELLIPTA) 200 MCG/ACT AEPB INHALE 1 PUFF BY MOUTH EVERY DAY Patient taking differently: Take 1 puff by mouth daily.  12/03/19  Yes Kozlow, Donnamarie Poag, MD  ketoconazole (NIZORAL) 2 % cream Apply to the affected areas underneath breasts, belly and in groin region ONCE daily x2-4 weeks. Patient taking differently: Apply 1 application topically See admin instructions. Apply to the affected areas underneath breasts, belly and in groin region ONCE daily for 2-4 weeks 09/16/18  Yes Gottschalk, Ashly M, DO  levETIRAcetam (KEPPRA XR) 500 MG 24 hr tablet Take 4 tablets (2,000 mg total) by mouth at bedtime. 11/10/18  Yes Suzzanne Cloud, NP  metolazone (ZAROXOLYN) 2.5 MG tablet Take 1 Tablet by mouth on Mondays, Wednesdays and Fridays 08/17/19  Yes Hawks, Marienthal A, [name]  metoprolol tartrate (LOPRESSOR) 25 MG tablet Take 1 tablet (25 mg total) by mouth 2 (two) times daily. 01/12/20  Yes Tat, Shanon Brow, MD  mometasone (ELOCON) 0.1 % ointment Apply to skin once daily as directed after bathing. 03/04/18  Yes Kozlow, Donnamarie Poag, MD  montelukast (SINGULAIR) 10 MG tablet TAKE 1 TABLET BY MOUTH EVERYDAY AT BEDTIME Patient taking differently: Take 10 mg by mouth at bedtime.  12/03/19  Yes Kozlow, Donnamarie Poag, MD  Multiple Vitamins-Minerals (ONE-A-DAY WOMENS 50 PLUS) TABS Take 1 tablet by mouth daily with breakfast.   Yes [provider]  nystatin-triamcinolone ointment (MYCOLOG) Apply 1 application topically 2 (two) times daily. 11/16/15  Yes Hawks, Christy A, [name]  omeprazole (PRILOSEC) 40 MG capsule Take 1 capsule (40 mg total) by mouth daily. Patient taking differently: Take 40  mg by mouth daily as needed (for reflux).  07/03/18  Yes Kozlow, Donnamarie Poag, MD  triamcinolone cream (KENALOG) 0.1 % Apply 1 application topically 2 (two) times daily. Patient taking differently: Apply 1 application topically 2 (two) times daily as needed (for eczema).  03/18/18  Yes Kozlow, Donnamarie Poag, MD  nystatin (MYCOSTATIN) 100000 UNIT/ML suspension Take 5 mLs (500,000 Units total) by mouth 4 (four) times daily. Patient not taking: Reported on 02/03/2020 12/28/19   Dettinger, Fransisca Kaufmann, MD    Inpatient Medications: Scheduled Meds: . allopurinol  50 mg Oral Daily  . aspirin  81 mg Oral Daily  . Chlorhexidine Gluconate Cloth  6 each Topical Daily  . diltiazem  120 mg Oral Daily  . divalproex  500 mg Oral BID  . heparin  5,000 Units Subcutaneous Q8H  . levETIRAcetam  2,000 mg Oral QHS  . loratadine  10 mg Oral Daily  . metoprolol tartrate  25 mg Oral BID  . montelukast  10 mg Oral QHS   Continuous Infusions: . sodium chloride 33.3 mL/hr at 02/04/20 0900  . azithromycin    . cefTRIAXone (ROCEPHIN)  IV     PRN Meds: acetaminophen **OR** acetaminophen, famotidine, nitroGLYCERIN, ondansetron **OR** ondansetron (ZOFRAN) IV, polyethylene glycol  Allergies:    Allergies  Allergen Reactions  . Advicor [Niacin-Lovastatin Er] Other (See Comments)    Reaction unknown to daughter  . Caduet [Amlodipine-Atorvastatin] Other (See Comments)    Reaction unknown to daughter  . Codeine Other (See Comments)    Reaction unknown to daughter  . Dilantin [Phenytoin] Other (See Comments)    Reaction unknown to daughter  . Lescol [Fluvastatin] Other (See Comments)    Reaction unknown to daughter  . Pamelor [Nortriptyline] Other (See Comments)    Reaction unknown to daughter  . Penicillins Hives    Did it involve swelling of the face/tongue/throat, SOB, or low BP? Unk Did it involve sudden or severe rash/hives, skin peeling, or any reaction on the inside of your mouth or nose? Unk Did you need to seek  medical attention at a hospital or doctor's office? Unk When did it last happen? "it was a long time ago" If all above answers are "NO", may proceed with cephalosporin use.   . Sulfa Antibiotics Hives  . Trileptal [Oxcarbazepine] Other (See Comments)    Reaction unknown to daughter  . Ultram [Tramadol] Other (See Comments)    Reaction unknown to daughter  . Welchol [Colesevelam] Other (See Comments)    Reaction unknown to daughter  . Latex Rash  . Tomato Rash    Social History:   Social History   Socioeconomic History  . Marital status: Widowed    Spouse name: Not on file  . Number of children: 4  . Years of education: Not on file  . Highest education level: Not on file  Occupational History  . Occupation: Pharmacist, hospital    Comment: retired  Tobacco Use  . Smoking status: Never Smoker  . Smokeless tobacco: Never Used  Vaping Use  . Vaping Use: Never used  Substance and Sexual Activity  . Alcohol use: No  . Drug use: No  . Sexual activity: Not Currently  Other Topics Concern  . Not on file  Social History Narrative   Patient is right handed.   Patient drinks some caffeine daily.   Social Determinants of Health   Financial Resource Strain:   . Difficulty of Paying Living Expenses: Not on file  Food Insecurity:   . Worried About Charity fundraiser in the Last Year: Not on file  . Ran Out of Food in the Last Year: Not on file  Transportation Needs:   . Lack of Transportation (Medical): Not on file  . Lack of Transportation (Non-Medical): Not on file  Physical Activity:   . Days of Exercise per Week: Not on file  . Minutes of Exercise per Session: Not on file  Stress:   . Feeling of Stress : Not on file  Social Connections:   . Frequency of Communication with Friends and Family: Not on file  . Frequency of Social Gatherings with Friends and Family: Not on file  . Attends Religious Services: Not on file  . Active Member of Clubs or Organizations:  Not on file  .  Attends Archivist Meetings: Not on file  . Marital Status: Not on file  Intimate Partner Violence:   . Fear of Current or Ex-Partner: Not on file  . Emotionally Abused: Not on file  . Physically Abused: Not on file  . Sexually Abused: Not on file     Family History:    Family History  Problem Relation Age of Onset  . Diabetes Mother   . Heart disease Mother   . Heart disease Father   . Cancer Sister   . Cancer Brother   . Cancer Sister       Review of Systems    Not obtained due to patient's acute illness.   Physical Exam/Data    Vitals:   02/04/20 0451 02/04/20 0745 02/04/20 0901 02/04/20 0911  BP: (!) 116/31 (!) 82/52 (!) 140/95   Pulse: 90 (!) 168    Resp: 20 (!) 24 (!) 24 16  Temp: 98 F (36.7 C)     TempSrc: Oral     SpO2: 95% 98% 97%   Weight:    92.6 kg  Height:    5\' 6"  (1.676 m)    Intake/Output Summary (Last 24 hours) at 02/04/2020 0948 Last data filed at 02/04/2020 0900 Gross per 24 hour  Intake 1433.07 ml  Output 1 ml  Net 1432.07 ml   Filed Weights   02/03/20 1530 02/04/20 0911  Weight: 90.7 kg 92.6 kg   Body mass index is 32.95 kg/m.   General: Pleasant elderly female appearing in NAD Psych: Normal affect. Neuro: Alert and oriented X 3. Moves all extremities spontaneously. HEENT: Normal  Neck: Supple without bruits or JVD. Lungs:  Resp regular and unlabored, CTA without wheezing or rales. Heart: Irregularly irregular, tachycardiac. No s3, s4, or murmurs. Abdomen: Soft, non-tender, non-distended, BS + x 4.  Extremities: No clubbing, cyanosis or edema. DP/PT/Radials 2+ and equal bilaterally.   EKG:  The EKG was personally reviewed and demonstrates: NSR, HR 90 with LAD and no acute ST changes.  Telemetry:  Telemetry was personally reviewed and demonstrates: Atrial flutter with RVR, HR in 160's to 180's.   Labs/Studies     Relevant CV Studies:  Echocardiogram: 12/2019 IMPRESSIONS    1. Left ventricular ejection  fraction, by estimation, is 60 to 65%. The  left ventricle has normal function. The left ventricle has no regional  wall motion abnormalities. There is mild concentric left ventricular  hypertrophy. Left ventricular diastolic  parameters are consistent with Grade I diastolic dysfunction (impaired  relaxation).  2. Right ventricular systolic function is normal. The right ventricular  size is normal. There is normal pulmonary artery systolic pressure.  3. Left atrial size was mildly dilated.  4. The mitral valve is normal in structure. Trivial mitral valve  regurgitation. No evidence of mitral stenosis.  5. The aortic valve is normal in structure. Aortic valve regurgitation is  not visualized. Mild aortic valve sclerosis is present, with no evidence  of aortic valve stenosis.   Laboratory Data:  Chemistry Recent Labs  Lab 02/03/20 1550 02/04/20 0544  NA 132* 136  K 5.7* 3.9  CL 101 100  CO2 21* 23  GLUCOSE 116* 91  BUN 47* 46*  CREATININE 1.94* 1.68*  CALCIUM 9.3 9.6  GFRNONAA 22* 26*  ANIONGAP 10 13    No results for input(s): PROT, ALBUMIN, AST, ALT, ALKPHOS, BILITOT in the last 168 hours. Hematology Recent Labs  Lab 02/03/20 1550 02/04/20 0544  WBC 23.4* 24.8*  RBC 3.80* 3.65*  HGB 8.7* 8.3*  HCT 27.3* 26.6*  MCV 71.8* 72.9*  MCH 22.9* 22.7*  MCHC 31.9 31.2  RDW 22.5* 21.6*  PLT 478* 523*   Cardiac EnzymesNo results for input(s): TROPONINI in the last 168 hours. No results for input(s): TROPIPOC in the last 168 hours.  BNPNo results for input(s): BNP, PROBNP in the last 168 hours.  DDimer No results for input(s): DDIMER in the last 168 hours.  Radiology/Studies:  DG Chest 2 View  Result Date: 02/03/2020 CLINICAL DATA:  Atrial fibrillation EXAM: CHEST - 2 VIEW COMPARISON:  01/09/2020 FINDINGS: Stable cardiomediastinal contours. New interstitial opacity within the right lower lobe. No pleural effusion. No pneumothorax. IMPRESSION: New interstitial opacity  within the right lower lobe, suspicious for pneumonia. Electronically Signed   By: Davina Poke D.O.   On: 02/03/2020 16:26     Assessment & Plan    1. Paroxysmal Atrial Flutter with RVR - She has a known history of paroxysmal atrial flutter and was in atrial flutter the day of admission but converted back to NSR with IV Cardizem. Developed recurrent tachycardia this AM with HR in the 160's to 180's. Developed subsequent hypotension which was also likely exacerbated by SL NTG as she reported left shoulder pain at that time. Initial and delta HS Troponin negative at 15 and 12.  - She received IV Amiodarone 150mg  bolus and has been transferred to the ICU. Has remained on IV Amiodarone 60 mg/hr with plans to transition to 30 mg/hr later today. Would anticipate continuing IV loading for at least 24 hours. Remains on Lopressor 25mg  BID and Cardizem CD 120mg  daily and can continue for now as long as BP remains stable.  - This patients CHA2DS2-VASc Score and unadjusted Ischemic Stroke Rate (% per year) is equal to 11.2 % stroke rate/year from a score of 7 (HTN, Female, Vascular, Age (2), CVA (2)). Anticoagulation previously reviewed with the patient and family and not pursued given her frequent falls.   2. HTN - Initially hypotensive this AM in the setting of significant tachycardia and having received SL NTG, now improved to 140/95 on most recent check. She has been continued on PTA Cardizem CD 120mg  daily and Lopressor 25mg  BID.   3. Stage 3 CKD - Baseline creatinine 1.3 - 1.5. Elevated to 1.94 on admission, improved to 1.68 today.   4. PNA - CXR on admission was concerning for PNA and WBC was elevated to 23.4. Remains on Ceftriaxone and Doxycycline. Further management per admitting team.    For questions or updates, please contact [place] Please consult [email or web address] for contact info under Cardiology/STEMI.  Signed, Erma Heritage, PA-C 02/04/2020, 9:48 AM Pager:  [phone] Attending note  Patient seen and discussed with PA Ahmed Prima, I agree with her documentation. 86 yo female history of HTN, HL, CVA, seizures, CKD IV, afib. Admitted 12/2019 aflutter with RVR and chest pressure, she was rate controlled with dilt and lopressor. After discussions with family given her frequent falls at home she was not started on anticoag. Admitted this admission for pneumonia, WBC 23 along with AKI on CKD. On admission was in [place], however early this AM aflutter with RVR with some left sided chest pain. SBP initially in 60s, however improved to 90s at time of my evaluation and thus was not emergently cardioverted. Started on IV amiodarone and transferred to ICU.    Paroxysmal aflutter, perhaps this episode driven by pneumonia. Continue amio gtt, with soft bp's  hold her home beta blocker and CCB. She had chest pressure last month in setting of her aflutter with RVR that resolved with rate control, I suspect same cause this time around. Not a candidate for ischemic testing given advanced age and comorbidities, would manage arrhythmia and monitor symptoms. Currently with rates low 100s chest pain has resolved   Carlyle Dolly MD

## 2020-02-04 NOTE — Progress Notes (Signed)
SLP Cancellation Note  Patient Details Name: HONORE WIPPERFURTH MRN: [id number] DOB: Jul 29, 1923   Cancelled treatment:       Reason Eval/Treat Not Completed: Medical issues which prohibited therapy (Rapid Response called this AM); BSE ordered last night, however Pt has had a change in medical status requiring a higher level of care. She has been transferred to ICU. SLP will sign off. Please reconsult when Pt is appropriate for BSE, if indicated.   Thank you,  Genene Churn, [place]    [place] 02/04/2020, 8:54 AM

## 2020-02-04 NOTE — Progress Notes (Signed)
Patient pulled out only IV access. Multiple attempts at regaining IV access have all failed. Order for midline placement. Awaiting IV team communication. Currently patient is NOT receiving IV amiodarone for heart rate control due to no IV access. Patient is NSR in the 90s. Most recent BP is 97/53.

## 2020-02-04 NOTE — Progress Notes (Signed)
PROGRESS NOTE    [name]  WER:[id number] DOB: [birth year] DOA: 02/03/2020 PCP: [name], [name]  Brief Narrative:  86 year old black female Known history of HTN hyperlipidemia  prior stroke seizures   cognitive impairment And very poor historian  Recent admission 01/09/2020 through 01/12/2020 with chest pain shortness of breath-at that time found to have atrial fibrillation CHADS2 score >6 and because of frequent falls it was elected not to start on apixaban echo at the time showed EF 60-65% she also had a leukocytosis and was given cefdinir secondary to possible infectious process  She was working with physical therapy 10/20 and heart rate was noted to be 160s-history was obtained mainly by chart review and she was somewhat tangential-she was given Cardizem reduced dose of metoprolol and converted to sinus rhythm Work-up in the emergency room showed new right basilar opacity of the chest leukocytosis 23 started on Levaquin Also found to have AKI superimposed on CKD 2-3  She flipped into SVT on the floor as per rapid response nurses note 10/21 AM  Assessment & Plan:   Principal Problem:   PNA (pneumonia) Active Problems:   Seizure disorder ([place])   Essential hypertension, benign   Chronic kidney disease (CKD), stage IV (severe) ([place])   A-fib ([place])   1. SVT likely driven by pneumonia, flutter >fibrillation CHADS2 score >7 a. Patient given adenosine sublingual nitroglycerin became hypotensive given a dose of digoxin but ultimately given amiodarone with good effect b. Transferred to stepdown continue amiodarone loading c. Appreciate cardiology input d. Continue subcu heparin only at this time--- she is not on long-term anticoagulation secondary to high risk of GI bleed/falls with risk of other life-threatening bleed 2. ?  Pneumonia concerning for aspiration a. Continue ceftriaxone and azithromycin and Romycin discontinued in favor of doxycycline given risk of QTC  prolongation b. Continue IV fluid 75 cc/h and monitor ins and outs carefully 3. Hypotension a. Hypotensive after initial resuscitation attempts morning of 10/21 which is now improved keep map above 60 b. Require stepdown monitoring 4. Prior stroke with cognitive impairment a. Continue aspirin 81 mg only 5. Acute superimposed on chronic kidney disease stage III 6. Hyperkalemia on admission a. Moderately improved-follow trends with lab work 7. Seizure disorder a. Continue Depakote 500 twice daily, Keppra 2 g at bedtime 8. Gout   DVT prophylaxis: Subcu heparin Code Status: Full CODE STATUS Family Communication: called Hayli Milligan [phone]--- was last seen by him slightly confused but relatively normal on 10/17-at this point he elects not to make a decision about CODE STATUS and wants to talk to his mother to consolidate this for himself so we await further input and likely will transition patient to inpatient for heart rate control etc. etc. Disposition:   Status is: Observation  The patient will require care spanning > 2 midnights and should be moved to inpatient because: Persistent severe electrolyte disturbances, Ongoing active pain requiring inpatient pain management and Unsafe d/c plan  Dispo: The patient is from: Home              Anticipated d/c is to: SNF              Anticipated d/c date is: 3 days              Patient currently is not medically stable to d/c.       Consultants:   Cardiology  Procedures: None  Antimicrobials: As above   Subjective: Incoherent and unable to clearly get answers-seems to  be hyper focused on left upper arm discomfort  Objective: Vitals:   02/04/20 0451 02/04/20 0745 02/04/20 0901 02/04/20 0911  BP: (!) 116/31 (!) 82/52 (!) 140/95   Pulse: 90 (!) 168    Resp: 20 (!) 24 (!) 24 16  Temp: 98 F (36.7 C)     TempSrc: Oral     SpO2: 95% 98% 97%   Weight:    92.6 kg  Height:    5\' 6"  (1.676 m)    Intake/Output Summary (Last 24  hours) at 02/04/2020 0941 Last data filed at 02/04/2020 0923 Gross per 24 hour  Intake 1233.45 ml  Output 1 ml  Net 1232.45 ml   Filed Weights   02/03/20 1530 02/04/20 0911  Weight: 90.7 kg 92.6 kg    Examination:  General exam: Edentulous cachectic black female in mild distress Respiratory system: Poor exam and lateral lung fields clear Cardiovascular system: S1-S2 tachycardic 160 Gastrointestinal system: Soft, nontender no rebound no guard. Central nervous system: Neurologically intact no focal deficit to gross exam Extremities: No lower extremity edema Skin: No rash Psychiatry: Confused  Data Reviewed: I have personally reviewed following labs and imaging studies Sodium 136 potassium 3.9 BUN/creatinine down from 47/1.94 6/1.6 White count up from 23.4-->24.8 Hemoglobin down from 8.7-appearance 8.3 platelet 523 Troponin XV  Radiology Studies: DG Chest 2 View  Result Date: 02/03/2020 CLINICAL DATA:  Atrial fibrillation EXAM: CHEST - 2 VIEW COMPARISON:  01/09/2020 FINDINGS: Stable cardiomediastinal contours. New interstitial opacity within the right lower lobe. No pleural effusion. No pneumothorax. IMPRESSION: New interstitial opacity within the right lower lobe, suspicious for pneumonia. Electronically Signed   By: Davina Poke D.O.   On: 02/03/2020 16:26     Scheduled Meds: . allopurinol  50 mg Oral Daily  . aspirin  81 mg Oral Daily  . Chlorhexidine Gluconate Cloth  6 each Topical Daily  . diltiazem  120 mg Oral Daily  . divalproex  500 mg Oral BID  . heparin  5,000 Units Subcutaneous Q8H  . levETIRAcetam  2,000 mg Oral QHS  . loratadine  10 mg Oral Daily  . metoprolol tartrate  25 mg Oral BID  . montelukast  10 mg Oral QHS   Continuous Infusions: . sodium chloride 33.3 mL/hr at 02/04/20 0900  . azithromycin    . cefTRIAXone (ROCEPHIN)  IV       LOS: 0 days    Time spent: 50 minutes  [name], MD [facility] To contact the  attending provider between 7A-7P or the covering provider during after hours 7P-7A, please log into the web site [email or web address] and access using universal [place] password for that web site. If you do not have the password, please call the hospital operator.  02/04/2020, 9:41 AM

## 2020-02-04 NOTE — Progress Notes (Signed)
Spoke with pt's son and POA, Ryana Montecalvo, to inform him of pt's status and transfer to ICU. [facility] [place] notified that son wants to speak with them regarding POA paperwork and if he needs to come to hospital today. Also notified MD [place].

## 2020-02-04 NOTE — Consult Note (Addendum)
Consultation Note Date: 02/04/2020   Patient Name: [name]  DOB: [birth year]  MRN: [id number]  Age / Sex: 86 y.o., female  PCP: [name], [name] Referring Physician: [name], MD  Reason for Consultation: Establishing goals of care  HPI/Patient Profile: 86 y.o. female  with past medical history of CKD stage 4, seizures, hypertension, atrial fibrillation not on anticoagulation because of fall risk, cognitive impairment admitted on 02/03/2020 with increased heart rate up to 160's per reports of home health physical therapist. Two recent hospitalizations for pneumonia/SVT and aflutter. In ED, CXR revealed new right basilar opacity started on Levaquin. Also found to have AKI superimposed on CKD. Rapid response called on 10/21 due to SVT, hypotension, and chest discomfort. Cardiology following. IV amiodarone initiated. Patient is not a candidate for ischemic evaluation due to age and co-morbidities. Transferred to stepdown ICU for closer monitoring. Palliative medicine consultation for goals of care.   Clinical Assessment and Goals of Care:  I have reviewed medical records, discussed with care team, and met with patient at bedside. She is oriented to place but pleasantly confused. She thought she was in [place]. She does not remember being transferred to the hospital but knows she was moved downstairs this afternoon. She denies current pain or discomfort. She fixates on her medications that were given incorrectly by nurse at [facility]. No family at bedside.   Spoke with daughter, [name] via telephone. Also spoke with son, Eddie Dibbles. [name] is local and provides the most care for her mother. Eddie Dibbles is reported HCPOA.   I introduced Palliative Medicine as specialized medical care for people living with serious illness. It focuses on providing relief from the symptoms and stress of a serious illness. The goal  is to improve quality of life for both the patient and the family.  Prior to first admission in September, patient living home independently. This is her third hospitalization in one month. She briefly discharged to SNF rehab and was home for only one day before readmission. She has become significantly weaker and more confused since her medication regimen was incorrect at nursing home.   Provided update to both [name] and Eddie Dibbles on their mother's condition including diagnoses, interventions, plan of care. Updated them on cardiology recommendations including that she is not a candidate for invasive ischemic evaluation.   Advanced directives, concepts specific to code status, artifical feeding and hydration were discussed. Eddie Dibbles reports he is documented HCPOA. Requested documentation for EMR. Eddie Dibbles and [name] share they have not discussed EOL wishes with their mother. Encouraged they consider limitations to care including DNR/DNI with underlying age, frailty, chronic conditions and poor outcomes of CPR. Discussed the concept of treating the treatable and ongoing medical management for afib.   Eddie Dibbles and [place] plan to attempt further discussions with other two siblings as well as the patient through the weekend. Eddie Dibbles will likely visit [facility] tomorrow 10/22.  Questions and concerns were addressed. Reassured of palliative provider f/u Monday if their mother is still hospitalized.     SUMMARY OF  RECOMMENDATIONS    Continue full code/full scope.  Ongoing palliative discussions.   Son Eddie Dibbles) is reported HCPOA. He will bring documentation to [facility] to be scanned into EMR.   Family to further discuss goals/code status.   PMT provider off service at [facility] until Monday, October 25th but will follow-up if still hospitalized.   Recommend outpatient palliative referral.   Code Status/Advance Care Planning:  Full code  Symptom Management:   Per attending  Palliative Prophylaxis:   Aspiration, Delirium  Protocol, Frequent Pain Assessment, Oral Care and Turn Reposition  Psycho-social/Spiritual:   Desire for further Chaplaincy support:yes  Additional Recommendations: Caregiving  Support/Resources and Compassionate Wean Education  Prognosis:   Unable to determine  Discharge Planning: To Be Determined      Primary Diagnoses: Present on Admission: . PNA (pneumonia) . Essential hypertension, benign . Chronic kidney disease (CKD), stage IV (severe) ([place]) . A-fib [facility])   I have reviewed the medical record, interviewed the patient and family, and examined the patient. The following aspects are pertinent.  Past Medical History:  Diagnosis Date  . Chronic low back pain   . Degenerative arthritis   . Eczema   . Edema   . Gout   . Headache   . Hiatal hernia   . Hypertension   . Internal hemorrhoids   . Neurogenic claudication ([place])   . Obesity   . Seizure disorder ([place])   . Seizures ([place])   . Stroke ([place])   . Urticaria    Social History   Socioeconomic History  . Marital status: Widowed    Spouse name: Not on file  . Number of children: 4  . Years of education: Not on file  . Highest education level: Not on file  Occupational History  . Occupation: Pharmacist, hospital    Comment: retired  Tobacco Use  . Smoking status: Never Smoker  . Smokeless tobacco: Never Used  Vaping Use  . Vaping Use: Never used  Substance and Sexual Activity  . Alcohol use: No  . Drug use: No  . Sexual activity: Not Currently  Other Topics Concern  . Not on file  Social History Narrative   Patient is right handed.   Patient drinks some caffeine daily.   Social Determinants of Health   Financial Resource Strain:   . Difficulty of Paying Living Expenses: Not on file  Food Insecurity:   . Worried About Charity fundraiser in the Last Year: Not on file  . Ran Out of Food in the Last Year: Not on file  Transportation Needs:   . Lack of Transportation (Medical): Not on file  . Lack of  Transportation (Non-Medical): Not on file  Physical Activity:   . Days of Exercise per Week: Not on file  . Minutes of Exercise per Session: Not on file  Stress:   . Feeling of Stress : Not on file  Social Connections:   . Frequency of Communication with Friends and Family: Not on file  . Frequency of Social Gatherings with Friends and Family: Not on file  . Attends Religious Services: Not on file  . Active Member of Clubs or Organizations: Not on file  . Attends Archivist Meetings: Not on file  . Marital Status: Not on file   Family History  Problem Relation Age of Onset  . Diabetes Mother   . Heart disease Mother   . Heart disease Father   . Cancer Sister   . Cancer Brother   .  Cancer Sister    Scheduled Meds: . allopurinol  50 mg Oral Daily  . aspirin  81 mg Oral Daily  . Chlorhexidine Gluconate Cloth  6 each Topical Daily  . divalproex  500 mg Oral BID  . heparin  5,000 Units Subcutaneous Q8H  . levETIRAcetam  2,000 mg Oral QHS  . loratadine  10 mg Oral Daily  . montelukast  10 mg Oral QHS   Continuous Infusions: . sodium chloride 33.3 mL/hr at 02/04/20 0900  . amiodarone 60 mg/hr (02/04/20 1043)  . amiodarone    . cefTRIAXone (ROCEPHIN)  IV    . doxycycline (VIBRAMYCIN) IV     PRN Meds:.acetaminophen **OR** acetaminophen, famotidine, nitroGLYCERIN, ondansetron **OR** ondansetron (ZOFRAN) IV, polyethylene glycol Medications Prior to Admission:  Prior to Admission medications   Medication Sig Start Date End Date Taking? Authorizing Provider  acetaminophen (TYLENOL) 500 MG tablet Take 500-1,000 mg by mouth every 8 (eight) hours as needed for mild pain or headache.    Yes [provider]  allopurinol (ZYLOPRIM) 100 MG tablet TAKE 1/2 TABLET BY MOUTH EVERY DAY Patient taking differently: Take 50 mg by mouth daily.  09/24/19  Yes Hawks, Christy A, [name]  aspirin 81 MG tablet Take 81 mg by mouth daily.   Yes [provider]  cetirizine (ZYRTEC)  10 MG tablet TAKE 1 TABLET BY MOUTH EVERY DAY Patient taking differently: Take 10 mg by mouth daily.  09/25/19  Yes Hawks, Christy A, [name]  diltiazem (CARDIZEM CD) 120 MG 24 hr capsule Take 1 capsule (120 mg total) by mouth daily. 01/12/20  Yes Tat, Shanon Brow, MD  divalproex (DEPAKOTE) 250 MG DR tablet TAKE ONE TABLET BY MOUTH TWICE A DAY FOR 2 WEEKS, THEN TAKE 2 TABLETS TWICE A DAY THEREAFTER Patient taking differently: Take 500 mg by mouth 2 (two) times daily.  10/20/19  Yes Kathrynn Ducking, MD  famotidine (PEPCID) 40 MG tablet Take 1 tablet (40 mg total) by mouth daily. Patient taking differently: Take 40 mg by mouth daily as needed for heartburn.  04/08/19  Yes Kozlow, Donnamarie Poag, MD  Fluticasone Furoate (ARNUITY ELLIPTA) 200 MCG/ACT AEPB INHALE 1 PUFF BY MOUTH EVERY DAY Patient taking differently: Take 1 puff by mouth daily.  12/03/19  Yes Kozlow, Donnamarie Poag, MD  ketoconazole (NIZORAL) 2 % cream Apply to the affected areas underneath breasts, belly and in groin region ONCE daily x2-4 weeks. Patient taking differently: Apply 1 application topically See admin instructions. Apply to the affected areas underneath breasts, belly and in groin region ONCE daily for 2-4 weeks 09/16/18  Yes Ronnie Doss M, DO  metolazone (ZAROXOLYN) 2.5 MG tablet Take 1 Tablet by mouth on Mondays, Wednesdays and Fridays 08/17/19  Yes Hawks, Christy A, [name]  metoprolol tartrate (LOPRESSOR) 25 MG tablet Take 1 tablet (25 mg total) by mouth 2 (two) times daily. 01/12/20  Yes Tat, Shanon Brow, MD  mometasone (ELOCON) 0.1 % ointment Apply to skin once daily as directed after bathing. 03/04/18  Yes Kozlow, Donnamarie Poag, MD  montelukast (SINGULAIR) 10 MG tablet TAKE 1 TABLET BY MOUTH EVERYDAY AT BEDTIME Patient taking differently: Take 10 mg by mouth at bedtime.  12/03/19  Yes Kozlow, Donnamarie Poag, MD  Multiple Vitamins-Minerals (ONE-A-DAY WOMENS 50 PLUS) TABS Take 1 tablet by mouth daily with breakfast.   Yes [provider]  nystatin-triamcinolone  ointment (MYCOLOG) Apply 1 application topically 2 (two) times daily. 11/16/15  Yes Hawks, Christy A, [name]  omeprazole (PRILOSEC) 40 MG capsule Take 1 capsule (  40 mg total) by mouth daily. Patient taking differently: Take 40 mg by mouth daily as needed (for reflux).  07/03/18  Yes Kozlow, Donnamarie Poag, MD  triamcinolone cream (KENALOG) 0.1 % Apply 1 application topically 2 (two) times daily. Patient taking differently: Apply 1 application topically 2 (two) times daily as needed (for eczema).  03/18/18  Yes Kozlow, Donnamarie Poag, MD  levETIRAcetam (KEPPRA XR) 500 MG 24 hr tablet TAKE 4 TABLETS (2,000 MG TOTAL) BY MOUTH AT BEDTIME. 02/04/20   Suzzanne Cloud, NP  nystatin (MYCOSTATIN) 100000 UNIT/ML suspension Take 5 mLs (500,000 Units total) by mouth 4 (four) times daily. Patient not taking: Reported on 02/03/2020 12/28/19   Dettinger, Fransisca Kaufmann, MD   Allergies  Allergen Reactions  . Advicor [Niacin-Lovastatin Er] Other (See Comments)    Reaction unknown to daughter  . Caduet [Amlodipine-Atorvastatin] Other (See Comments)    Reaction unknown to daughter  . Codeine Other (See Comments)    Reaction unknown to daughter  . Dilantin [Phenytoin] Other (See Comments)    Reaction unknown to daughter  . Lescol [Fluvastatin] Other (See Comments)    Reaction unknown to daughter  . Pamelor [Nortriptyline] Other (See Comments)    Reaction unknown to daughter  . Penicillins Hives    Did it involve swelling of the face/tongue/throat, SOB, or low BP? Unk Did it involve sudden or severe rash/hives, skin peeling, or any reaction on the inside of your mouth or nose? Unk Did you need to seek medical attention at a hospital or doctor's office? Unk When did it last happen? "it was a long time ago" If all above answers are "NO", may proceed with cephalosporin use.   . Sulfa Antibiotics Hives  . Trileptal [Oxcarbazepine] Other (See Comments)    Reaction unknown to daughter  . Ultram [Tramadol] Other (See Comments)    Reaction  unknown to daughter  . Welchol [Colesevelam] Other (See Comments)    Reaction unknown to daughter  . Latex Rash  . Tomato Rash   Review of Systems  Unable to perform ROS: Mental status change   Physical Exam Vitals and nursing note reviewed.  Constitutional:      General: She is awake.     Appearance: She is ill-appearing.  HENT:     Head: Normocephalic and atraumatic.  Cardiovascular:     Rate and Rhythm: Rhythm irregularly irregular.  Pulmonary:     Effort: No tachypnea, accessory muscle usage or respiratory distress.  Skin:    General: Skin is warm and dry.  Neurological:     Mental Status: She is alert.     Comments: Oriented to person, pleasant confusion  Psychiatric:        Mood and Affect: Mood normal.        Speech: Speech normal.     Vital Signs: BP 95/65   Pulse (!) 168   Temp 98 F (36.7 C) (Oral)   Resp 18   Ht '5\' 6"'  (1.676 m)   Wt 92.6 kg   SpO2 97%   BMI 32.95 kg/m  Pain Scale: 0-10   Pain Score: 4    SpO2: SpO2: 97 % O2 Device:SpO2: 97 % O2 Flow Rate: .   IO: Intake/output summary:   Intake/Output Summary (Last 24 hours) at 02/04/2020 1434 Last data filed at 02/04/2020 0900 Gross per 24 hour  Intake 1433.07 ml  Output 1 ml  Net 1432.07 ml    LBM: Last BM Date:  (pt unsure of when last bm was)  Baseline Weight: Weight: 90.7 kg Most recent weight: Weight: 92.6 kg     Palliative Assessment/Data: PPS 50%      Time Total: 60 Greater than 50%  of this time was spent counseling and coordinating care related to the above assessment and plan.  Signed by:  Ihor Dow, DNP, [name]-C Palliative Medicine Team  Phone: [phone] Fax: [phone]   Please contact Palliative Medicine Team phone at [phone] for questions and concerns.  For individual provider: See Shea Evans

## 2020-02-04 NOTE — Progress Notes (Signed)
0745: Called by central tele, pt's heart rate 140-170 SVT sustained. To pt's room to find her sitting on side of bed, c/o left shoulder pain. Skin warm and dry, color apprpriate, chest CTA. Pt placed back in bed, B/P 82/52, SVT 168, resp 24/min, SaO2 98% room air. MD Samtani notified via [facility] page. Rapid Response called. EKG started. 61: MD Samtani in room to evaluate pt, rapid response team in room. HR remains 172 SVT. Pt remains alert and oriented, c/o left upper arm pain. 3734: Carotid massage performed by MD with heart rate dropping to 125 briefly, current b/p 109/58.  0758: Adenosine 6mg  IV given per MD order, current HR 161. 0759: Heart rate down to 118/min, pt states left shoulder feels better. 2876: Pt now complaining of increased left arm pain, heart rate 104 ST. NTG 0.4mg  SL given per MD order. 0805: B/P 82/66 (MAP 74) HR back up to 152 SVT. NS 250 fluid bolus started. 8115: B/P 83/68 (MAP 57), heart rate remains at 163. 0810:Digoxin 0.25mg  IV given per order. Current b/p 62/47 (MAP 62), heart rate 160. Second 240ml bolus started. 0812: Cardiology PA in room to consult. 7262: B/P 69/50 (MAP 58) Heartrate remains 160. Fluid bolus continues. 0355: B/P 78/51 (MAP 61), Heart rate 158, pt continues to complain of left shoulder and left upper arm pain. MD Samtani speaking with pt's daughter [name] by phone. Advised her that pt would be transferring down to ICU stepdown unit for closer monitoring once current condition stabilizes. 0820: Dr. Harl Bowie with Cardiology in room to evaluate pt. [phone]: B/P 88/56 (MAP 68), Heart rate 161 SVT. Third 250 ml fluid bolus started. 6384: B/P 93/66 (MAP 74), heart rate 153. Amiodarone 150mg  bolus started per order. 0830: B/P 98/79 (MAP 84). Heart rate 136 ST. Pt to be transferred to ICU stepdown bed 09 per MD order. 5364: B/P 95/67 (MAP 75), heart rate 138 ST. Pt continues to complain of pain left upper arm and shoulder. SaO2 100%. Multiple attempts to start  second IV have been made by several of team members without success. MD order for IV team consult for mid-line placed. 0840: Pt transferred by ICU staff members to ICU stepdown bed 09 via stretcher, alert, oriented and talkative. Pt has received total of 750 ml of normal saline IV fluid bolus during this rapid response event.

## 2020-02-05 DIAGNOSIS — Z515 Encounter for palliative care: Secondary | ICD-10-CM

## 2020-02-05 DIAGNOSIS — I480 Paroxysmal atrial fibrillation: Secondary | ICD-10-CM | POA: Diagnosis not present

## 2020-02-05 DIAGNOSIS — I4892 Unspecified atrial flutter: Secondary | ICD-10-CM | POA: Diagnosis not present

## 2020-02-05 DIAGNOSIS — Z7189 Other specified counseling: Secondary | ICD-10-CM

## 2020-02-05 LAB — CBC WITH DIFFERENTIAL/PLATELET
Abs Immature Granulocytes: 0.5 10*3/uL — ABNORMAL HIGH (ref 0.00–0.07)
Basophils Absolute: 0.3 10*3/uL — ABNORMAL HIGH (ref 0.0–0.1)
Basophils Relative: 2 %
Eosinophils Absolute: 0.1 10*3/uL (ref 0.0–0.5)
Eosinophils Relative: 1 %
HCT: 23.8 % — ABNORMAL LOW (ref 36.0–46.0)
Hemoglobin: 7.4 g/dL — ABNORMAL LOW (ref 12.0–15.0)
Immature Granulocytes: 3 %
Lymphocytes Relative: 14 %
Lymphs Abs: 2.5 10*3/uL (ref 0.7–4.0)
MCH: 22.8 pg — ABNORMAL LOW (ref 26.0–34.0)
MCHC: 31.1 g/dL (ref 30.0–36.0)
MCV: 73.2 fL — ABNORMAL LOW (ref 80.0–100.0)
Monocytes Absolute: 0.2 10*3/uL (ref 0.1–1.0)
Monocytes Relative: 1 %
Neutro Abs: 14.3 10*3/uL — ABNORMAL HIGH (ref 1.7–7.7)
Neutrophils Relative %: 79 %
Platelets: 419 10*3/uL — ABNORMAL HIGH (ref 150–400)
RBC: 3.25 MIL/uL — ABNORMAL LOW (ref 3.87–5.11)
RDW: 21.2 % — ABNORMAL HIGH (ref 11.5–15.5)
WBC: 17.9 10*3/uL — ABNORMAL HIGH (ref 4.0–10.5)
nRBC: 3.4 % — ABNORMAL HIGH (ref 0.0–0.2)

## 2020-02-05 LAB — COMPREHENSIVE METABOLIC PANEL
ALT: 14 U/L (ref 0–44)
AST: 18 U/L (ref 15–41)
Albumin: 2.2 g/dL — ABNORMAL LOW (ref 3.5–5.0)
Alkaline Phosphatase: 45 U/L (ref 38–126)
Anion gap: 9 (ref 5–15)
BUN: 37 mg/dL — ABNORMAL HIGH (ref 8–23)
CO2: 23 mmol/L (ref 22–32)
Calcium: 8.7 mg/dL — ABNORMAL LOW (ref 8.9–10.3)
Chloride: 104 mmol/L (ref 98–111)
Creatinine, Ser: 1.35 mg/dL — ABNORMAL HIGH (ref 0.44–1.00)
GFR, Estimated: 36 mL/min — ABNORMAL LOW (ref >60)
Glucose, Bld: 92 mg/dL (ref 70–99)
Potassium: 3.7 mmol/L (ref 3.5–5.1)
Sodium: 136 mmol/L (ref 135–145)
Total Bilirubin: 0.5 mg/dL (ref 0.3–1.2)
Total Protein: 6.1 g/dL — ABNORMAL LOW (ref 6.5–8.1)

## 2020-02-05 LAB — MAGNESIUM: Magnesium: 1.7 mg/dL (ref 1.7–2.4)

## 2020-02-05 MED ORDER — AMIODARONE HCL 200 MG PO TABS
400.0000 mg | ORAL_TABLET | Freq: Two times a day (BID) | ORAL | Status: DC
  Administered 2020-02-05 – 2020-02-10 (×11): 400 mg via ORAL
  Filled 2020-02-05 (×12): qty 2

## 2020-02-05 NOTE — Progress Notes (Signed)
PROGRESS NOTE    [name]  EGB:[id number] DOB: 1923/06/08 DOA: 02/03/2020 PCP: [name], [name]  Brief Narrative:  86 year old black female Known history of HTN hyperlipidemia  prior stroke seizures   cognitive impairment And very poor historian  Recent admission 01/09/2020 through 01/12/2020 with chest pain shortness of breath-at that time found to have atrial fibrillation CHADS2 score >6 and because of frequent falls  was elected not to start on apixaban 2/2 to high bleed risk   echo at the time showed EF 60-65% she also had a leukocytosis and was given cefdinir secondary to possible infectious process  She was working with physical therapy 10/20 and heart rate was noted to be 160s-history was obtained mainly by chart review and she was somewhat tangential-she was given Cardizem reduced dose of metoprolol and converted to sinus rhythm  Work-up in the emergency room showed new right basilar opacity of the chest leukocytosis 23 started on Levaquin Also found to have AKI superimposed on CKD 2-3  She flipped into SVT on the floor as per rapid response nurses note 10/21 AM  Assessment & Plan:   Principal Problem:   PNA (pneumonia) Active Problems:   Seizure disorder ([place])   Essential hypertension, benign   Chronic kidney disease (CKD), stage IV (severe) ([place])   A-fib ([place])   SVT (supraventricular tachycardia) ([place])   1. SVT likely driven by pneumonia, flutter >fibrillation CHADS2 score >7 a. Patient decompensated 10/21 with SVT/RVR but stabvilized after Amio loading on SDU, now on orals b. Rpt mag, lytes am c. Cardiology following 2. ?  Pneumonia concerning for aspiration a. Continue ceftriaxone.  Doxy> Azithro 2/2 riskQTC prolongation b. Nursing informed some risk for cough-so downgraded to Dys 3 10/21--await SLP eval c. Continue IV fluid 75 cc/h  3. Hypotension a. Hypotensive after initial resuscitation attempts morning of 10/21 which is now improved keep map  above 60 b. Is + 1.8 liters from admit 4. Prior stroke with cognitive impairment a. Continue aspirin 81 mg only b. Seeking GOC with discussions per family as per bvelow 5. Acute superimposed on chronic kidney disease stage III 6. Hyperkalemia on admission a.  7. Seizure disorder a. Continue Depakote 500 twice daily, Keppra 2 g at bedtime 8. Gout   DVT prophylaxis: Subcu heparin Code Status: Full CODE STATUS Family Communication: called Jasnoor Trussell [phone], met him at bedside 1-/22 to discuss and patient is full code Disposition:   Status is: Observation  The patient will require care spanning > 2 midnights and should be moved to inpatient because: Persistent severe electrolyte disturbances, Ongoing active pain requiring inpatient pain management and Unsafe d/c plan  Dispo: The patient is from: Home              Anticipated d/c is to: SNF              Anticipated d/c date is: 3 days              Patient currently is not medically stable to d/c.       Consultants:   Cardiology  Procedures: None  Antimicrobials: As above   Subjective: Much more awake coherent in nad Son in room No cp feve rchills tol dys diet HR for most part sinus and now on oral AMio  Objective: Vitals:   02/05/20 0500 02/05/20 0515 02/05/20 0530 02/05/20 0545  BP: (!) 119/45 (!) 101/44 (!) 119/39 (!) 109/42  Pulse:      Resp: '20 19 16 ' 14  Temp:      TempSrc:      SpO2:      Weight:      Height:        Intake/Output Summary (Last 24 hours) at 02/05/2020 0717 Last data filed at 02/05/2020 0600 Gross per 24 hour  Intake 760.44 ml  Output 400 ml  Net 360.44 ml   Filed Weights   02/03/20 1530 02/04/20 0911  Weight: 90.7 kg 92.6 kg    Examination:  General exam: Edentulous cachectic black female in mild distress Respiratory system: clear no adde dsound no rales rhonchi Cardiovascular system: S1-S2 now sinius with pvc and some limited afib Gastrointestinal system: Soft,  nontender no rebound no guard. Central nervous system: Neurologically intact no focal deficit to gross exam  Data Reviewed: I have personally reviewed following labs and imaging studies k 3.7 Bun/cr 47/1.9-->37/1.3 Mag 1.7 WBC 24-->17.9 Hb 8.3-->7.4  Radiology Studies: DG Chest 2 View  Result Date: 02/03/2020 CLINICAL DATA:  Atrial fibrillation EXAM: CHEST - 2 VIEW COMPARISON:  01/09/2020 FINDINGS: Stable cardiomediastinal contours. New interstitial opacity within the right lower lobe. No pleural effusion. No pneumothorax. IMPRESSION: New interstitial opacity within the right lower lobe, suspicious for pneumonia. Electronically Signed   By: Davina Poke D.O.   On: 02/03/2020 16:26     Scheduled Meds: . allopurinol  50 mg Oral Daily  . amiodarone  400 mg Oral BID  . aspirin  81 mg Oral Daily  . Chlorhexidine Gluconate Cloth  6 each Topical Daily  . divalproex  500 mg Oral BID  . heparin  5,000 Units Subcutaneous Q8H  . levETIRAcetam  2,000 mg Oral QHS  . loratadine  10 mg Oral Daily  . montelukast  10 mg Oral QHS   Continuous Infusions: . cefTRIAXone (ROCEPHIN)  IV 1 g (02/05/20 1744)  . doxycycline (VIBRAMYCIN) IV Stopped (02/05/20 0709)     LOS: 1 day    Time spent: 30 minutes  [name], MD [facility] To contact the attending provider between 7A-7P or the covering provider during after hours 7P-7A, please log into the web site [email or web address] and access using universal [place] password for that web site. If you do not have the password, please call the hospital operator.  02/05/2020, 7:17 AM

## 2020-02-05 NOTE — Progress Notes (Signed)
Patient's HR increased and sustaining in 140's. Spoke with MD, verbal order to give 2200 amiodarone dose now and monitor.

## 2020-02-05 NOTE — Evaluation (Signed)
Clinical/Bedside Swallow Evaluation Patient Details  Name: [name] MRN: [id number] Date of Birth: [birth year]  Today's Date: 02/05/2020 Time: SLP Start Time (ACUTE ONLY): 68 SLP Stop Time (ACUTE ONLY): 1108 SLP Time Calculation (min) (ACUTE ONLY): 27 min  Past Medical History:  Past Medical History:  Diagnosis Date  . Chronic low back pain   . Degenerative arthritis   . Eczema   . Edema   . Gout   . Headache   . Hiatal hernia   . Hypertension   . Internal hemorrhoids   . Neurogenic claudication ([place])   . Obesity   . Seizure disorder ([place])   . Seizures ([place])   . Stroke ([place])   . Urticaria    Past Surgical History:  Past Surgical History:  Procedure Laterality Date  . ABDOMINAL HYSTERECTOMY    . APPENDECTOMY    . APPENDECTOMY    . bilateral knees    . cataracts    . CHOLECYSTECTOMY    . mammoplasty reduction    . NEUROPLASTY / TRANSPOSITION MEDIAN NERVE AT CARPAL TUNNEL BILATERAL    . RE-EXCISION OF BREAST LUMPECTOMY    . TUBAL LIGATION     HPI:  86 y.o. female  with past medical history of CKD stage 4, seizures, hypertension, atrial fibrillation not on anticoagulation because of fall risk, cognitive impairment admitted on 02/03/2020 with increased heart rate up to 160's per reports of home health physical therapist. Two recent hospitalizations for pneumonia/SVT and aflutter. In ED, CXR revealed new right basilar opacity started on Levaquin. Also found to have AKI superimposed on CKD. Rapid response called on 10/21 due to SVT, hypotension, and chest discomfort. Cardiology following. IV amiodarone initiated. Patient is not a candidate for ischemic evaluation due to age and co-morbidities. Transferred to stepdown ICU for closer monitoring.    Assessment / Plan / Recommendation Clinical Impression  Clinical swallowing evaluation completed while Pt was sitting upright in bed with [name] at bedside; Pt consumed thin liquids and puree textures without overt s/sx of  aspiration. Pt denies swallowing difficulties. Pt did report that she is unable to thoroughly masticate solids without her dentures, which are not currently available to her. [name] reports she should have them later today. Recommend downgrade Pt's diet to D2/fine chop pending Pt getting her dentures. Communicated with RN to support RN upgrade to D3 upon Pt receiving her dentures. ST will f/t X1 to ensure diet tolerance, Thank you SLP Visit Diagnosis: Dysphagia, unspecified (R13.10)    Aspiration Risk  Mild aspiration risk    Diet Recommendation Dysphagia 2 (Fine chop);Thin liquid   Liquid Administration via: Cup;Straw Medication Administration: Whole meds with liquid Supervision: Patient able to self feed Compensations: Minimize environmental distractions;Small sips/bites;Slow rate Postural Changes: Seated upright at 90 degrees;Remain upright for at least 30 minutes after po intake    Other  Recommendations Oral Care Recommendations: Oral care BID   Follow up Recommendations 24 hour supervision/assistance      Frequency and Duration min 1 x/week  1 week       Prognosis Prognosis for Safe Diet Advancement: Fair      Swallow Study   General Date of Onset: 02/03/20 HPI: 86 y.o. female  with past medical history of CKD stage 4, seizures, hypertension, atrial fibrillation not on anticoagulation because of fall risk, cognitive impairment admitted on 02/03/2020 with increased heart rate up to 160's per reports of home health physical therapist. Two recent hospitalizations for pneumonia/SVT and aflutter. In ED, CXR  revealed new right basilar opacity started on Levaquin. Also found to have AKI superimposed on CKD. Rapid response called on 10/21 due to SVT, hypotension, and chest discomfort. Cardiology following. IV amiodarone initiated. Patient is not a candidate for ischemic evaluation due to age and co-morbidities. Transferred to stepdown ICU for closer monitoring.  Type of Study: Bedside  Swallow Evaluation Previous Swallow Assessment: none in chart Diet Prior to this Study: Dysphagia 3 (soft);Thin liquids Temperature Spikes Noted: No Respiratory Status: Room air History of Recent Intubation: No Behavior/Cognition: Alert;Cooperative;Pleasant mood Oral Cavity Assessment: Within Functional Limits Oral Care Completed by SLP: Recent completion by staff Oral Cavity - Dentition: Dentures, not available;Edentulous Vision: Functional for self-feeding Self-Feeding Abilities: Able to feed self Patient Positioning: Upright in bed Baseline Vocal Quality: Normal Volitional Cough: Strong Volitional Swallow: Able to elicit    Oral/Motor/Sensory Function Overall Oral Motor/Sensory Function: Within functional limits   Ice Chips Ice chips: Within functional limits   Thin Liquid Thin Liquid: Within functional limits    Nectar Thick Nectar Thick Liquid: Not tested   Honey Thick Honey Thick Liquid: Not tested   Puree Puree: Within functional limits   Solid     Solid: Not tested     [name] H. Roddie Mc, CCC-SLP Speech Language Pathologist  Wende Bushy 02/05/2020,11:11 AM

## 2020-02-05 NOTE — Progress Notes (Addendum)
Progress Note  Patient Name: [name] Date of Encounter: 02/05/2020  [facility] Cardiologist: Fransico Him, MD   Subjective   No complaints  Inpatient Medications    Scheduled Meds: . allopurinol  50 mg Oral Daily  . aspirin  81 mg Oral Daily  . Chlorhexidine Gluconate Cloth  6 each Topical Daily  . divalproex  500 mg Oral BID  . heparin  5,000 Units Subcutaneous Q8H  . levETIRAcetam  2,000 mg Oral QHS  . loratadine  10 mg Oral Daily  . montelukast  10 mg Oral QHS   Continuous Infusions: . amiodarone 30 mg/hr (02/05/20 0509)  . cefTRIAXone (ROCEPHIN)  IV Stopped (02/04/20 1933)  . doxycycline (VIBRAMYCIN) IV 100 mg (02/05/20 0509)   PRN Meds: acetaminophen **OR** acetaminophen, famotidine, nitroGLYCERIN, ondansetron **OR** ondansetron (ZOFRAN) IV, polyethylene glycol   Vital Signs    Vitals:   02/05/20 0700 02/05/20 0730 02/05/20 0800 02/05/20 0830  BP: (!) 126/45 (!) 117/59    Pulse:      Resp: 14 16 16 15   Temp: 97.7 F (36.5 C)     TempSrc:      SpO2:      Weight:      Height:        Intake/Output Summary (Last 24 hours) at 02/05/2020 0900 Last data filed at 02/05/2020 0600 Gross per 24 hour  Intake 560.82 ml  Output 400 ml  Net 160.82 ml   Last 3 Weights 02/04/2020 02/03/2020 01/11/2020  Weight (lbs) 204 lb 2.3 oz 200 lb 215 lb 13.3 oz  Weight (kg) 92.6 kg 90.719 kg 97.9 kg      Telemetry    NSR - Personally Reviewed  ECG    n/a- Personally Reviewed  Physical Exam   GEN: No acute distress.   Neck: No JVD Cardiac: RRR, no murmurs, rubs, or gallops.  Respiratory: Clear to auscultation bilaterally. GI: Soft, nontender, non-distended  MS: No edema; No deformity. Neuro:  Nonfocal  Psych: Normal affect   Labs    High Sensitivity Troponin:   Recent Labs  Lab 01/06/20 1221 01/09/20 2104 01/10/20 0030 02/04/20 0807 02/04/20 0942  TROPONINIHS 14 21* 25* 15 12      Chemistry Recent Labs  Lab 02/03/20 1550  02/04/20 0544 02/05/20 0757  NA 132* 136 136  K 5.7* 3.9 3.7  CL 101 100 104  CO2 21* 23 23  GLUCOSE 116* 91 92  BUN 47* 46* 37*  CREATININE 1.94* 1.68* 1.35*  CALCIUM 9.3 9.6 8.7*  PROT  --   --  6.1*  ALBUMIN  --   --  2.2*  AST  --   --  18  ALT  --   --  14  ALKPHOS  --   --  45  BILITOT  --   --  0.5  GFRNONAA 22* 26* 36*  ANIONGAP 10 13 9      Hematology Recent Labs  Lab 02/03/20 1550 02/04/20 0544 02/05/20 0757  WBC 23.4* 24.8* PENDING  RBC 3.80* 3.65* 3.25*  HGB 8.7* 8.3* 7.4*  HCT 27.3* 26.6* 23.8*  MCV 71.8* 72.9* 73.2*  MCH 22.9* 22.7* 22.8*  MCHC 31.9 31.2 31.1  RDW 22.5* 21.6* 21.2*  PLT 478* 523* 419*    BNPNo results for input(s): BNP, PROBNP in the last 168 hours.   DDimer No results for input(s): DDIMER in the last 168 hours.   Radiology    DG Chest 2 View  Result Date: 02/03/2020 CLINICAL DATA:  Atrial fibrillation EXAM: CHEST - 2 VIEW COMPARISON:  01/09/2020 FINDINGS: Stable cardiomediastinal contours. New interstitial opacity within the right lower lobe. No pleural effusion. No pneumothorax. IMPRESSION: New interstitial opacity within the right lower lobe, suspicious for pneumonia. Electronically Signed   By: Davina Poke D.O.   On: 02/03/2020 16:26    Cardiac Studies     Patient Profile    [name] is a 86 y.o. female with past medical history of paroxysmal atrial fibrillation (not an anticoagulation candidate due to frequent falls), HTN, HLD, Stage 3 CKD and seizure disorder who is being seen today for the evaluation of atrial flutter with RVR at the request of Dr. Verlon Au and Rapid Response Team.   Assessment & Plan    1. Paroxysmal aflutter - Admitted 12/2019 aflutter with RVR and chest pressure, she was rate controlled with dilt and lopressor. After discussions with family given her frequent falls at home she was not started on anticoag.  - Admitted this admission for pneumonia, WBC 23 along with AKI on CKD. On admission  was in SR, however early this AM aflutter with RVR with some left sided chest pain - SBP initially in 60s, however improved to 90s at time of my evaluation and thus was not emergently cardioverted. Started on IV amiodarone and transferred to ICU.     - lost IV access for a time yesterday, was off iv amio - tele shows she is in NSR. We will start oral amio 400mg  bid x 7 days, then 200mg  bid x 2 weeks, then 200mg  daily - bp's have improved, soft at times, continue to hold home beta blocker and CCB.    We will signoff inpatient care, please call us back if recurrent issues with arrhythmias.    For questions or updates, please contact [place] Please consult [email or web address] for contact info under        Signed, Carlyle Dolly, MD  02/05/2020, 9:00 AM

## 2020-02-05 NOTE — Plan of Care (Signed)
  Problem: SLP Dysphagia Goals Goal: Patient will utilize recommended strategies Description: Patient will utilize recommended strategies during swallow to increase swallowing safety with Flowsheets (Taken 02/05/2020 1113) Patient will utilize recommended strategies during swallow to increase swallowing safety with: mod assist Thank you, Ileene Allie H. Roddie Mc, [place] Speech Language Pathologist

## 2020-02-06 DIAGNOSIS — J189 Pneumonia, unspecified organism: Secondary | ICD-10-CM | POA: Diagnosis not present

## 2020-02-06 LAB — CBC WITH DIFFERENTIAL/PLATELET
Abs Immature Granulocytes: 0.64 10*3/uL — ABNORMAL HIGH (ref 0.00–0.07)
Basophils Absolute: 0.4 10*3/uL — ABNORMAL HIGH (ref 0.0–0.1)
Basophils Relative: 3 %
Eosinophils Absolute: 0.2 10*3/uL (ref 0.0–0.5)
Eosinophils Relative: 1 %
HCT: 22.3 % — ABNORMAL LOW (ref 36.0–46.0)
Hemoglobin: 6.9 g/dL — CL (ref 12.0–15.0)
Immature Granulocytes: 4 %
Lymphocytes Relative: 17 %
Lymphs Abs: 2.9 10*3/uL (ref 0.7–4.0)
MCH: 22.9 pg — ABNORMAL LOW (ref 26.0–34.0)
MCHC: 30.9 g/dL (ref 30.0–36.0)
MCV: 74.1 fL — ABNORMAL LOW (ref 80.0–100.0)
Monocytes Absolute: 0.1 10*3/uL (ref 0.1–1.0)
Monocytes Relative: 1 %
Neutro Abs: 12.4 10*3/uL — ABNORMAL HIGH (ref 1.7–7.7)
Neutrophils Relative %: 74 %
Platelets: 367 10*3/uL (ref 150–400)
RBC: 3.01 MIL/uL — ABNORMAL LOW (ref 3.87–5.11)
RDW: 21.3 % — ABNORMAL HIGH (ref 11.5–15.5)
WBC: 16.6 10*3/uL — ABNORMAL HIGH (ref 4.0–10.5)
nRBC: 5.2 % — ABNORMAL HIGH (ref 0.0–0.2)

## 2020-02-06 LAB — COMPREHENSIVE METABOLIC PANEL
ALT: 13 U/L (ref 0–44)
AST: 18 U/L (ref 15–41)
Albumin: 2.1 g/dL — ABNORMAL LOW (ref 3.5–5.0)
Alkaline Phosphatase: 43 U/L (ref 38–126)
Anion gap: 9 (ref 5–15)
BUN: 33 mg/dL — ABNORMAL HIGH (ref 8–23)
CO2: 22 mmol/L (ref 22–32)
Calcium: 8.8 mg/dL — ABNORMAL LOW (ref 8.9–10.3)
Chloride: 104 mmol/L (ref 98–111)
Creatinine, Ser: 1.46 mg/dL — ABNORMAL HIGH (ref 0.44–1.00)
GFR, Estimated: 33 mL/min — ABNORMAL LOW (ref >60)
Glucose, Bld: 86 mg/dL (ref 70–99)
Potassium: 4 mmol/L (ref 3.5–5.1)
Sodium: 135 mmol/L (ref 135–145)
Total Bilirubin: 0.7 mg/dL (ref 0.3–1.2)
Total Protein: 5.7 g/dL — ABNORMAL LOW (ref 6.5–8.1)

## 2020-02-06 LAB — ABO/RH: ABO/RH(D): A POS

## 2020-02-06 LAB — MAGNESIUM: Magnesium: 1.7 mg/dL (ref 1.7–2.4)

## 2020-02-06 LAB — PREPARE RBC (CROSSMATCH)

## 2020-02-06 MED ORDER — HALOPERIDOL 0.5 MG PO TABS
1.0000 mg | ORAL_TABLET | Freq: Three times a day (TID) | ORAL | Status: DC | PRN
  Filled 2020-02-06: qty 2

## 2020-02-06 MED ORDER — FUROSEMIDE 10 MG/ML IJ SOLN
20.0000 mg | Freq: Once | INTRAMUSCULAR | Status: AC
  Administered 2020-02-06: 20 mg via INTRAVENOUS
  Filled 2020-02-06: qty 2

## 2020-02-06 MED ORDER — ACETAMINOPHEN 325 MG PO TABS
650.0000 mg | ORAL_TABLET | Freq: Once | ORAL | Status: DC
  Filled 2020-02-06: qty 2

## 2020-02-06 MED ORDER — SODIUM CHLORIDE 0.9% IV SOLUTION: Freq: Once | INTRAVENOUS | Status: DC

## 2020-02-06 NOTE — Progress Notes (Signed)
PROGRESS NOTE    [name]  MSX:[id number] DOB: 1923/12/12 DOA: 02/03/2020 PCP: [name], [name]  Brief Narrative:  86 year old black female Known history of HTN hyperlipidemia prior stroke seizures  cognitive impairment very poor historian  Recent admission 01/09/2020 through 01/12/2020 with chest pain shortness of breath-at that time found to have atrial fibrillation CHADS2 score >6 and because of frequent falls  was elected not to start on apixaban 2/2 to high bleed risk   echo at the time showed EF 60-65% she also had a leukocytosis and was given cefdinir secondary to possible infectious process  She was working with physical therapy 10/20 and heart rate was noted to be 160s-history was obtained mainly by chart review and she was somewhat tangential-she was given Cardizem reduced dose of metoprolol and converted to sinus rhythm  Work-up in the emergency room showed new right basilar opacity of the chest leukocytosis 23 started on Levaquin Also found to have AKI superimposed on CKD 2-3  She flipped into SVT on the floor as per rapid response nurses note 10/21 AM  Assessment & Plan:   Principal Problem:   PNA (pneumonia) Active Problems:   Seizure disorder ([place])   Essential hypertension, benign   Chronic kidney disease (CKD), stage IV (severe) ([place])   A-fib ([place])   SVT (supraventricular tachycardia) ([place])   Palliative care by specialist   Goals of care, counseling/discussion   1. SVT likely driven by pneumonia, flutter >fibrillation CHADS2 score >7 a. Patient decompensated 10/21 with SVT/RVR -- initially on IV amiodarone transfer to stepdown-stabilized currently on amiodarone 400 twice daily with tapering instructions as per cardiology b. Magnesium is stable today would not need to replace c. Given stability probably can transfer out of stepdown today 2. Anemia unclear cause probably dilutional from resuscitation attempts on admission a. No reports of bleeding  does have macrocytosis? b. Will give 1 unit of PRBC and monitor and can transfer back out to telemetry 3. ?  Pneumonia concerning for aspiration a. Continue ceftriaxone.  Doxy> Azithro 2/2 riskQTC prolongation--we will treat for 5 days ending on 10/24 but would not de-escalate b. Nursing informed coughing-SLP recommended dysphagia to thin liquid c. Given complaints of sputum on 10/23 repeat CXR two-view today d. I will saline lock IVF 4. Hypotension a. Hypotensive after initial resuscitation attempts morning of 10/21 which is now improved keep map above 60 b. +1.0 L but weight is up from 90 to 94 kg secondary to fluid resuscitation c. Keep volumes even today 5. Prior stroke with cognitive impairment a. Continue aspirin 81 mg only b. Seeking GOC with discussions per family as per bvelow 6. Acute superimposed on chronic kidney disease stage III 7. Hyperkalemia on admission a. Resolved at this time 8. Seizure disorder a. Continue Depakote 500 twice daily, Keppra 2 g at bedtime 9. Gout   DVT prophylaxis: Subcu heparin Code Status: Full CODE STATUS Family Communication: called Aubria Vanecek [phone], met him at bedside 1-/22  No family available today he is aware we will update him in the next several days Disposition:   Status is: Observation  The patient will require care spanning > 2 midnights and should be moved to inpatient because: Persistent severe electrolyte disturbances, Ongoing active pain requiring inpatient pain management and Unsafe d/c plan  Dispo: The patient is from: Home              Anticipated d/c is to: SNF  Anticipated d/c date is: 3 days              Patient currently is not medically stable to d/c.       Consultants:   Cardiology  Procedures: None  Antimicrobials: As above   Subjective: Very coherent and making up her last time in terms of conversation A little difficult to redirect but very pleasant Up and out of bed eating  drinking no distress No dark stool no tarry stool does tell me she is having increasing phlegm and is coughing up more  Objective: Vitals:   02/06/20 0412 02/06/20 0600 02/06/20 0752 02/06/20 0918  BP:  (!) 106/34    Pulse:  67 94   Resp:  13 11   Temp: 98 F (36.7 C)  97.7 F (36.5 C)   TempSrc: Oral  Oral   SpO2:  99% 91% 97%  Weight: 94.9 kg     Height:        Intake/Output Summary (Last 24 hours) at 02/06/2020 1004 Last data filed at 02/05/2020 2154 Gross per 24 hour  Intake 250 ml  Output 750 ml  Net -500 ml   Filed Weights   02/03/20 1530 02/04/20 0911 02/06/20 0412  Weight: 90.7 kg 92.6 kg 94.9 kg    Examination:  General exam: Pleasant looks much younger than stated age Respiratory system: No rales rhonchi chest is relatively clear Cardiovascular system: S1-S2 sinus rhythm with PVCs Gastrointestinal system: Soft, nontender no rebound no guard. Central nervous system: Neurologically intact no focal deficit to gross exam  Data Reviewed: I have personally reviewed following labs and imaging studies k 3.7-->4.0 Bun/cr 47/1.9-->37/1.3-->33/1.4 Mag 1.7 WBC 24-->17.9-->16.6 Hb 8.3-->7.4-->6.9  Radiology Studies: No results found.   Scheduled Meds: . sodium chloride   Intravenous Once  . acetaminophen  650 mg Oral Once  . allopurinol  50 mg Oral Daily  . amiodarone  400 mg Oral BID  . aspirin  81 mg Oral Daily  . Chlorhexidine Gluconate Cloth  6 each Topical Daily  . divalproex  500 mg Oral BID  . heparin  5,000 Units Subcutaneous Q8H  . levETIRAcetam  2,000 mg Oral QHS  . loratadine  10 mg Oral Daily  . montelukast  10 mg Oral QHS   Continuous Infusions: . cefTRIAXone (ROCEPHIN)  IV 1 g (02/05/20 1744)  . doxycycline (VIBRAMYCIN) IV 100 mg (02/06/20 0535)     LOS: 2 days    Time spent: 20 minutes  [name], MD [facility] To contact the attending provider between 7A-7P or the covering provider during after hours 7P-7A,  please log into the web site [email or web address] and access using universal [place] password for that web site. If you do not have the password, please call the hospital operator.  02/06/2020, 10:04 AM

## 2020-02-07 ENCOUNTER — Inpatient Hospital Stay (HOSPITAL_COMMUNITY): Payer: Medicare PPO

## 2020-02-07 DIAGNOSIS — J189 Pneumonia, unspecified organism: Secondary | ICD-10-CM | POA: Diagnosis not present

## 2020-02-07 LAB — COMPREHENSIVE METABOLIC PANEL
ALT: 13 U/L (ref 0–44)
AST: 21 U/L (ref 15–41)
Albumin: 2.4 g/dL — ABNORMAL LOW (ref 3.5–5.0)
Alkaline Phosphatase: 46 U/L (ref 38–126)
Anion gap: 9 (ref 5–15)
BUN: 33 mg/dL — ABNORMAL HIGH (ref 8–23)
CO2: 22 mmol/L (ref 22–32)
Calcium: 9.1 mg/dL (ref 8.9–10.3)
Chloride: 102 mmol/L (ref 98–111)
Creatinine, Ser: 1.43 mg/dL — ABNORMAL HIGH (ref 0.44–1.00)
GFR, Estimated: 34 mL/min — ABNORMAL LOW (ref >60)
Glucose, Bld: 84 mg/dL (ref 70–99)
Potassium: 3.9 mmol/L (ref 3.5–5.1)
Sodium: 133 mmol/L — ABNORMAL LOW (ref 135–145)
Total Bilirubin: 0.7 mg/dL (ref 0.3–1.2)
Total Protein: 6.3 g/dL — ABNORMAL LOW (ref 6.5–8.1)

## 2020-02-07 LAB — TYPE AND SCREEN
ABO/RH(D): A POS
Antibody Screen: NEGATIVE
Unit division: 0

## 2020-02-07 LAB — CBC WITH DIFFERENTIAL/PLATELET
Basophils Absolute: 0.3 10*3/uL — ABNORMAL HIGH (ref 0.0–0.1)
Basophils Relative: 2 %
Eosinophils Absolute: 0.3 10*3/uL (ref 0.0–0.5)
Eosinophils Relative: 2 %
HCT: 27.2 % — ABNORMAL LOW (ref 36.0–46.0)
Hemoglobin: 8.3 g/dL — ABNORMAL LOW (ref 12.0–15.0)
Lymphocytes Relative: 22 %
Lymphs Abs: 3.5 10*3/uL (ref 0.7–4.0)
MCH: 23.2 pg — ABNORMAL LOW (ref 26.0–34.0)
MCHC: 30.5 g/dL (ref 30.0–36.0)
MCV: 76.2 fL — ABNORMAL LOW (ref 80.0–100.0)
Monocytes Absolute: 0 10*3/uL — ABNORMAL LOW (ref 0.1–1.0)
Monocytes Relative: 0 %
Neutro Abs: 11.6 10*3/uL — ABNORMAL HIGH (ref 1.7–7.7)
Neutrophils Relative %: 74 %
Platelets: 225 10*3/uL (ref 150–400)
RBC: 3.57 MIL/uL — ABNORMAL LOW (ref 3.87–5.11)
RDW: 22.2 % — ABNORMAL HIGH (ref 11.5–15.5)
WBC Morphology: ABNORMAL
WBC: 15.7 10*3/uL — ABNORMAL HIGH (ref 4.0–10.5)
nRBC: 5.2 % — ABNORMAL HIGH (ref 0.0–0.2)
nRBC: 6 /100 WBC — ABNORMAL HIGH

## 2020-02-07 LAB — BPAM RBC
Blood Product Expiration Date: 202111012359
ISSUE DATE / TIME: 202110231023
Unit Type and Rh: 6200

## 2020-02-07 NOTE — Plan of Care (Signed)
  Problem: Acute Rehab PT Goals(only PT should resolve) Goal: Pt Will Go Supine/Side To Sit Outcome: Progressing Flowsheets (Taken 02/07/2020 1251) Pt will go Supine/Side to Sit:  with modified independence  with supervision Goal: Patient Will Transfer Sit To/From Stand Outcome: Progressing Flowsheets (Taken 02/07/2020 1251) Patient will transfer sit to/from stand: with supervision Goal: Pt Will Transfer Bed To Chair/Chair To Bed Outcome: Progressing Flowsheets (Taken 02/07/2020 1251) Pt will Transfer Bed to Chair/Chair to Bed: min guard assist Goal: Pt Will Ambulate Outcome: Progressing Flowsheets (Taken 02/07/2020 1251) Pt will Ambulate:  100 feet  with min guard assist  with rolling walker   12:52 PM, 02/07/20 Lonell Grandchild, MPT Physical Therapist with [facility] [phone] office [phone] mobile phone

## 2020-02-07 NOTE — Progress Notes (Signed)
PROGRESS NOTE    [name]  MCN:[id number] DOB: 02-14-1924 DOA: 02/03/2020 PCP: [name], [name]  Brief Narrative:  86 year old black female Known history of HTN hyperlipidemia prior stroke seizures  cognitive impairment very poor historian  Recent admission 01/09/2020 through 01/12/2020 with chest pain shortness of breath-at that time found to have atrial fibrillation CHADS2 score >6 and because of frequent falls  was elected not to start on apixaban 2/2 to high bleed risk   echo at the time showed EF 60-65% she also had a leukocytosis and was given cefdinir secondary to possible infectious process  She was working with physical therapy 10/20 and heart rate was noted to be 160s-history was obtained mainly by chart review and she was somewhat tangential-she was given Cardizem reduced dose of metoprolol and converted to sinus rhythm  Work-up in the emergency room showed new right basilar opacity of the chest leukocytosis 23 started on Levaquin Also found to have AKI superimposed on CKD 2-3  She flipped into SVT on the floor as per rapid response nurses note 10/21 AM  Assessment & Plan:   Principal Problem:   PNA (pneumonia) Active Problems:   Seizure disorder ([place])   Essential hypertension, benign   Chronic kidney disease (CKD), stage IV (severe) ([place])   A-fib ([place])   SVT (supraventricular tachycardia) ([place])   Palliative care by specialist   Goals of care, counseling/discussion   1. SVT likely driven by pneumonia, flutter >fibrillation CHADS2 score >7 a. Patient decompensated 10/21 with SVT/RVR -- initially on IV amiodarone transfer to stepdown-stabilized currently on amiodarone 400 twice daily-metoprolol and other medications were discontinued given low blood pressures-on monitors she has had a few episodes of NSVT overnight but predominantly remains in sinus b. Recheck magnesium periodically c. Stable for telemetry transfer bed as needed 2. Anemia unclear cause  probably dilutional from resuscitation attempts on admission a. No reports of bleeding does have macrocytosis? b. Received 1 unit PRBC hemoglobin 8.3 no reports dark stool c. Recheck labs in a.m. 3. ?  Pneumonia concerning for aspiration a. Continue ceftriaxone.  Doxy> Azithro 2/2 riskQTC prolongation--we will treat for 5 days ending on 10/24 but would not de-escalate b. Continue dysphagia 2 diet with thin liquids as per speech c. Chest x-ray to my overview shows underexpanded lungs we will ask for incentive spirometer and probably repeat the x-ray as an outpatient if she continues to stabilize 4. Hypotension a. Hypotensive after initial resuscitation attempts morning of 10/21 which is now improved keep map above 60 b. Is 1 L since admission c. Saline locked 10/23 5. Prior stroke with cognitive impairment a. Continue aspirin 81 mg only b. Seeking GOC with discussions per family as per bvelow 6. Acute superimposed on chronic kidney disease stage III 7. Hyperkalemia on admission a. Resolved at this time 8. Seizure disorder a. Continue Depakote 500 twice daily, Keppra 2 g at bedtime 9. Gout   DVT prophylaxis: Subcu heparin Code Status: Full CODE STATUS Family Communication: called Virgen Belland [phone], d/w him on phone 10/24 in detail Disposition:   Status is: Observation  The patient will require care spanning > 2 midnights and should be moved to inpatient because: Persistent severe electrolyte disturbances, Ongoing active pain requiring inpatient pain management and Unsafe d/c plan  Dispo: The patient is from: Home              Anticipated d/c is to: SNF              Anticipated  d/c date is: 2 days              Patient currently is not medically stable to d/c.   Consultants:   Cardiology  Procedures: None  Antimicrobials: As above   Subjective: Complaining of poor sleep, pain in left arm No new distress but seems stable compared to yesterday  Objective: Vitals:    02/07/20 0016 02/07/20 0124 02/07/20 0400 02/07/20 0737  BP: (!) 110/57 (!) 122/47    Pulse: 87 98  86  Resp: 14 14  15   Temp: 97.9 F (36.6 C)  98 F (36.7 C) 97.6 F (36.4 C)  TempSrc: Oral   Oral  SpO2: 91% 94%  95%  Weight:      Height:        Intake/Output Summary (Last 24 hours) at 02/07/2020 0911 Last data filed at 02/06/2020 2130 Gross per 24 hour  Intake 1500 ml  Output 1000 ml  Net 500 ml   Filed Weights   02/03/20 1530 02/04/20 0911 02/06/20 0412  Weight: 90.7 kg 92.6 kg 94.9 kg    Examination:  General exam: Pleasant sitting in chair no distress Respiratory system: No rales rhonchi chest is relatively clear no added sound rales rhonchi Cardiovascular system: S1-S2 sinus rhythm with PVCs Gastrointestinal system: Soft, nontender no rebound no guard. Central nervous system: Neurologically intact no focal deficit to gross exam  Data Reviewed: I have personally reviewed following labs and imaging studies k 3.7-->4.0 Bun/cr 47/1.9-->37/1.3-->33/1.4 Mag 1.7 WBC 24-->17.9-->16.6 Hb 8.3-->7.4-->6.9  Radiology Studies: No results found.   Scheduled Meds: . sodium chloride   Intravenous Once  . acetaminophen  650 mg Oral Once  . allopurinol  50 mg Oral Daily  . amiodarone  400 mg Oral BID  . aspirin  81 mg Oral Daily  . Chlorhexidine Gluconate Cloth  6 each Topical Daily  . divalproex  500 mg Oral BID  . heparin  5,000 Units Subcutaneous Q8H  . levETIRAcetam  2,000 mg Oral QHS  . loratadine  10 mg Oral Daily  . montelukast  10 mg Oral QHS   Continuous Infusions: . cefTRIAXone (ROCEPHIN)  IV 1 g (02/06/20 1739)  . doxycycline (VIBRAMYCIN) IV 100 mg (02/07/20 0653)     LOS: 3 days    Time spent:35 minutes  [name], MD [facility] To contact the attending provider between 7A-7P or the covering provider during after hours 7P-7A, please log into the web site [email or web address] and access using universal [place] password for that web  site. If you do not have the password, please call the hospital operator.  02/07/2020, 9:11 AM

## 2020-02-07 NOTE — Evaluation (Signed)
Physical Therapy Evaluation Patient Details Name: [name] MRN: [id number] DOB: [birth year] Today's Date: 02/07/2020   History of Present Illness  [name] is a 86 y.o. female with medical history significant for hypertension, CKD 4, seizures, atrial fibrillation, cognitive impairment.Patient was brought to the ED today with reports of increased heart rate up to 160s.  Patient's physical therapist was at home with the patient and noticed her son called EMS.  EMS gave 22 mg of Cardizem.  On my evaluation, patient denies cough or difficulty breathing, denies fevers, or feeling ill, she tells me she has some back pain.  She denies vomiting, no loose stools, no abdominal pain. Patient answering some questions, unsure how reliable her history is, as patient speech is tangential, she has cognitive impairment, also documented during prior hospitalization.    Clinical Impression  Patient has to lean over RW to support self due to BLE weakness, demonstrates slow labored cadence with decreased balance once fatigued.  Patient tolerated staying up in chair after therapy with her son present at bedside.  Patient will benefit from continued physical therapy in hospital and recommended venue below to increase strength, balance, endurance for safe ADLs and gait.     Follow Up Recommendations SNF    Equipment Recommendations  None recommended by PT    Recommendations for Other Services       Precautions / Restrictions Precautions Precautions: Fall Restrictions Weight Bearing Restrictions: No      Mobility  Bed Mobility Overal bed mobility: Needs Assistance Bed Mobility: Supine to Sit;Sit to Supine     Supine to sit: Min guard Sit to supine: Min guard   General bed mobility comments: slightly labored movement, increased time    Transfers Overall transfer level: Needs assistance Equipment used: Rolling walker (2 wheeled) Transfers: Sit to/from [facility] Sit to  Stand: Min guard;Min assist Stand pivot transfers: Min assist       General transfer comment: increased time, labored movment  Ambulation/Gait Ambulation/Gait assistance: Min assist Gait Distance (Feet): 60 Feet Assistive device: Rolling walker (2 wheeled) Gait Pattern/deviations: Decreased step length - right;Decreased step length - left;Decreased stride length;Trunk flexed Gait velocity: decreased   General Gait Details: slow labored cadence with increased reliance on BUE to maintain standing balance with trunk flexed, limited secondary to fatigue and leans over RW even more once fatigued  Stairs            Wheelchair Mobility    Modified Rankin (Stroke Patients Only)       Balance Overall balance assessment: Needs assistance Sitting-balance support: Feet supported;No upper extremity supported Sitting balance-Leahy Scale: Good Sitting balance - Comments: seated at EOB   Standing balance support: During functional activity;Bilateral upper extremity supported Standing balance-Leahy Scale: Fair Standing balance comment: using RW                             Pertinent Vitals/Pain Pain Assessment: No/denies pain    Home Living Family/patient expects to be discharged to:: Private residence Living Arrangements: Alone;Children Available Help at Discharge: Family;Available PRN/intermittently Type of Home: House Home Access: Stairs to enter Entrance Stairs-Rails: Right Entrance Stairs-Number of Steps: 5-6 Home Layout: One level Home Equipment: Bedside commode;Walker - 2 wheels;Walker - 4 wheels;Cane - single point;Shower seat      Prior Function Level of Independence: Needs assistance   Gait / Transfers Assistance Needed: Household ambulator using SPC, occasional RW for longer distances  ADL's /  Homemaking Assistance Needed: Daughter assists with tub transfers and washing difficult to reach areas, neighbor checks on patient daily        Hand  Dominance   Dominant Hand: Right    Extremity/Trunk Assessment   Upper Extremity Assessment Upper Extremity Assessment: Generalized weakness    Lower Extremity Assessment Lower Extremity Assessment: Generalized weakness    Cervical / Trunk Assessment Cervical / Trunk Assessment: Kyphotic  Communication   Communication: No difficulties  Cognition Arousal/Alertness: Awake/alert Behavior During Therapy: WFL for tasks assessed/performed Overall Cognitive Status: Within Functional Limits for tasks assessed                                        General Comments      Exercises     Assessment/Plan    PT Assessment Patient needs continued PT services  PT Problem List Decreased strength;Decreased activity tolerance;Decreased balance;Decreased mobility       PT Treatment Interventions Balance training;DME instruction;Gait training;Stair training;Functional mobility training;Therapeutic activities;Patient/family education;Therapeutic exercise    PT Goals (Current goals can be found in the Care Plan section)  Acute Rehab PT Goals Patient Stated Goal: return home after rehab PT Goal Formulation: With patient/family Time For Goal Achievement: 02/21/20 Potential to Achieve Goals: Good    Frequency Min 3X/week   Barriers to discharge        Co-evaluation               AM-PAC PT "6 Clicks" Mobility  Outcome Measure Help needed turning from your back to your side while in a flat bed without using bedrails?: None Help needed moving from lying on your back to sitting on the side of a flat bed without using bedrails?: A Little Help needed moving to and from a bed to a chair (including a wheelchair)?: A Little Help needed standing up from a chair using your arms (e.g., wheelchair or bedside chair)?: A Little Help needed to walk in hospital room?: A Lot Help needed climbing 3-5 steps with a railing? : A Lot 6 Click Score: 17    End of Session Equipment  Utilized During Treatment: Gait belt Activity Tolerance: Patient tolerated treatment well;Patient limited by fatigue Patient left: in chair;with call bell/phone within reach;with family/visitor present Nurse Communication: Mobility status PT Visit Diagnosis: Unsteadiness on feet (R26.81);Other abnormalities of gait and mobility (R26.89);Muscle weakness (generalized) (M62.81)    Time: 0263-7858 PT Time Calculation (min) (ACUTE ONLY): 30 min   Charges:   PT Evaluation $PT Eval Moderate Complexity: 1 Mod PT Treatments $Therapeutic Activity: 23-37 mins        12:50 PM, 02/07/20 Lonell Grandchild, MPT Physical Therapist with [facility] [phone] office [phone] mobile phone

## 2020-02-08 ENCOUNTER — Ambulatory Visit: Payer: Medicare PPO | Admitting: Family

## 2020-02-08 DIAGNOSIS — I4892 Unspecified atrial flutter: Secondary | ICD-10-CM | POA: Diagnosis not present

## 2020-02-08 LAB — COMPREHENSIVE METABOLIC PANEL
ALT: 13 U/L (ref 0–44)
AST: 18 U/L (ref 15–41)
Albumin: 2.4 g/dL — ABNORMAL LOW (ref 3.5–5.0)
Alkaline Phosphatase: 43 U/L (ref 38–126)
Anion gap: 9 (ref 5–15)
BUN: 30 mg/dL — ABNORMAL HIGH (ref 8–23)
CO2: 23 mmol/L (ref 22–32)
Calcium: 9.4 mg/dL (ref 8.9–10.3)
Chloride: 102 mmol/L (ref 98–111)
Creatinine, Ser: 1.19 mg/dL — ABNORMAL HIGH (ref 0.44–1.00)
GFR, Estimated: 42 mL/min — ABNORMAL LOW (ref >60)
Glucose, Bld: 92 mg/dL (ref 70–99)
Potassium: 4.3 mmol/L (ref 3.5–5.1)
Sodium: 134 mmol/L — ABNORMAL LOW (ref 135–145)
Total Bilirubin: 0.5 mg/dL (ref 0.3–1.2)
Total Protein: 6.1 g/dL — ABNORMAL LOW (ref 6.5–8.1)

## 2020-02-08 LAB — CBC WITH DIFFERENTIAL/PLATELET
Abs Immature Granulocytes: 0.39 10*3/uL — ABNORMAL HIGH (ref 0.00–0.07)
Basophils Absolute: 0 10*3/uL (ref 0.0–0.1)
Basophils Relative: 0 %
Eosinophils Absolute: 0.4 10*3/uL (ref 0.0–0.5)
Eosinophils Relative: 3 %
HCT: 26.2 % — ABNORMAL LOW (ref 36.0–46.0)
Hemoglobin: 8.2 g/dL — ABNORMAL LOW (ref 12.0–15.0)
Lymphocytes Relative: 11 %
Lymphs Abs: 1.6 10*3/uL (ref 0.7–4.0)
MCH: 23.5 pg — ABNORMAL LOW (ref 26.0–34.0)
MCHC: 31.3 g/dL (ref 30.0–36.0)
MCV: 75.1 fL — ABNORMAL LOW (ref 80.0–100.0)
Monocytes Absolute: 0.6 10*3/uL (ref 0.1–1.0)
Monocytes Relative: 4 %
Neutro Abs: 11.9 10*3/uL — ABNORMAL HIGH (ref 1.7–7.7)
Neutrophils Relative %: 82 %
Platelets: 361 10*3/uL (ref 150–400)
RBC: 3.49 MIL/uL — ABNORMAL LOW (ref 3.87–5.11)
RDW: 23 % — ABNORMAL HIGH (ref 11.5–15.5)
WBC: 14.5 10*3/uL — ABNORMAL HIGH (ref 4.0–10.5)
nRBC: 3.1 % — ABNORMAL HIGH (ref 0.0–0.2)

## 2020-02-08 LAB — SARS CORONAVIRUS 2 BY RT PCR (HOSPITAL ORDER, PERFORMED IN ~~LOC~~ HOSPITAL LAB): SARS Coronavirus 2: NEGATIVE

## 2020-02-08 NOTE — Evaluation (Signed)
Occupational Therapy Evaluation Patient Details Name: [name] MRN: [id number] DOB: 04-14-1924 Today's Date: 02/08/2020    History of Present Illness [name] is a 86 y.o. female with medical history significant for hypertension, CKD 4, seizures, atrial fibrillation, cognitive impairment.Patient was brought to the ED today with reports of increased heart rate up to 160s.  Patient's physical therapist was at home with the patient and noticed her son called EMS.  EMS gave 22 mg of Cardizem.  On my evaluation, patient denies cough or difficulty breathing, denies fevers, or feeling ill, she tells me she has some back pain.  She denies vomiting, no loose stools, no abdominal pain. Patient answering some questions, unsure how reliable her history is, as patient speech is tangential, she has cognitive impairment, also documented during prior hospitalization.   Clinical Impression   Pt requesting to sit on EOB for medication and breakfast upon therapy arrival. Therapist assisted during OT evaluation. Patient demonstrates good sitting balance while completing seated ADL task. Patient did not transfer with OT although according to PT evaluation, she demonstrated a forward standing posture due to weakness in BLE. Based on this, patient will benefit from OT services at a SNF prior to returning home alone to increase BUE strength and endurance in order to complete ADL tasks. OT will follow patient acutely.     Follow Up Recommendations  SNF    Equipment Recommendations  None recommended by OT       Precautions / Restrictions Precautions Precautions: Fall Restrictions Weight Bearing Restrictions: No      Mobility Bed Mobility Overal bed mobility: Needs Assistance Bed Mobility: Supine to Sit     Supine to sit: Supervision;HOB elevated     General bed mobility comments: VC to use bed rail to assist. Patient was able to request that the head of bed be lowered when needed during  transition.    Transfers Overall transfer level:  (Not completed. See PT eval)                    Balance Overall balance assessment: No apparent balance deficits (not formally assessed) Sitting-balance support: No upper extremity supported;Feet unsupported Sitting balance-Leahy Scale: Good Sitting balance - Comments: seated at EOB        ADL either performed or assessed with clinical judgement   ADL Overall ADL's : Needs assistance/impaired Eating/Feeding: Set up;Sitting   Grooming: Wash/dry face;Wash/dry hands;Supervision/safety;Sitting   Upper Body Bathing: Minimal assistance;Sitting   Lower Body Bathing: Moderate assistance;Sitting/lateral leans;Sit to/from stand   Upper Body Dressing : Minimal assistance;Sitting   Lower Body Dressing: Moderate assistance;Sit to/from stand;Sitting/lateral leans           Vision Baseline Vision/History: No visual deficits Patient Visual Report: No change from baseline              Pertinent Vitals/Pain Pain Assessment: No/denies pain     Hand Dominance Right   Extremity/Trunk Assessment Upper Extremity Assessment Upper Extremity Assessment: Generalized weakness   Lower Extremity Assessment Lower Extremity Assessment: Defer to PT evaluation       Communication Communication Communication: No difficulties   Cognition Arousal/Alertness: Awake/alert Behavior During Therapy: WFL for tasks assessed/performed Overall Cognitive Status: Within Functional Limits for tasks assessed                  Home Living Family/patient expects to be discharged to:: Skilled nursing facility Living Arrangements: Alone Available Help at Discharge: Family;Available PRN/intermittently Type of Home: House Home Access:  Stairs to enter [employer] of Steps: 5-6 Entrance Stairs-Rails: Right Home Layout: One level     Bathroom Shower/Tub: Teacher, early years/pre: Handicapped height Bathroom  Accessibility: Yes   Home Equipment: Bedside commode;Walker - 2 wheels;Walker - 4 wheels;Cane - single point;Shower seat          Prior Functioning/Environment Level of Independence: Needs assistance  Gait / Transfers Assistance Needed: Household ambulator using SPC, occasional RW for longer distances ADL's / Homemaking Assistance Needed: Daughter assists with tub transfers and washing difficult to reach areas, neighbor checks on patient daily. Pt reports that her daughter meal preps for her and she is able to remove meals from freezer and use microwave without difficulty.            OT Problem List: Decreased strength;Decreased activity tolerance;Impaired balance (sitting and/or standing)      OT Treatment/Interventions: Therapeutic exercise;Therapeutic activities;Self-care/ADL training;Energy conservation;Patient/family education;Manual therapy;Modalities;Balance training;DME and/or AE instruction    OT Goals(Current goals can be found in the care plan section) Acute Rehab OT Goals Patient Stated Goal: return home after rehab OT Goal Formulation: With patient Time For Goal Achievement: 02/22/20 Potential to Achieve Goals: Good  OT Frequency: Min 2X/week   Barriers to D/C:    Pt lives alone          AM-PAC OT "6 Clicks" Daily Activity     Outcome Measure Help from another person eating meals?: A Little Help from another person taking care of personal grooming?: A Little Help from another person toileting, which includes using toliet, bedpan, or urinal?: Total Help from another person bathing (including washing, rinsing, drying)?: A Lot Help from another person to put on and taking off regular upper body clothing?: A Lot Help from another person to put on and taking off regular lower body clothing?: A Lot 6 Click Score: 13   End of Session Equipment Utilized During Treatment: Oxygen (Oxygen was removed prior to sitting on EOB. O2 stats remained in the upper  90's)  Activity Tolerance: Patient tolerated treatment well Patient left: in bed;with call bell/phone within reach  OT Visit Diagnosis: Muscle weakness (generalized) (M62.81)                Time: 5003-7048 OT Time Calculation (min): 16 min Charges:  OT General Charges $OT Visit: 1 Visit OT Evaluation $OT Eval Low Complexity: [place], OTR/L,CBIS  [phone]   Geral Tuch, Clarene Duke 02/08/2020, 9:16 AM

## 2020-02-08 NOTE — Progress Notes (Signed)
PMT provider chart reviewed and discussed with Dr. Verlon Au. No f/u needs per attending. PMT provider will sign off. Thank you.   NO CHARGE  Ihor Dow, [place], [name]-C Palliative Medicine Team  Phone: [phone] Fax: [phone]

## 2020-02-08 NOTE — Progress Notes (Signed)
PROGRESS NOTE    [name]  DGU:[id number] DOB: 1923-08-22 DOA: 02/03/2020 PCP: [name], [name]  Brief Narrative:  86 year old black female Known history of HTN hyperlipidemia prior stroke seizures  cognitive impairment with mild dementia at baseline  Recent admission 01/09/2020 through 01/12/2020 with chest pain shortness of breath-at that time found to have atrial fibrillation CHADS2 score >6 and because of frequent falls  was elected not to start on apixaban 2/2 to high bleed risk   echo at the time showed EF 60-65% she also had a leukocytosis and was given cefdinir secondary to possible infectious process  She was working with physical therapy 10/20 and heart rate was noted to be 160s-history was obtained mainly by chart review and she was somewhat tangential-she was given Cardizem reduced dose of metoprolol and converted to sinus rhythm  Work-up in the emergency room showed new right basilar opacity of the chest leukocytosis 23 started on Levaquin Also found to have AKI superimposed on CKD 2-3  She flipped into SVT on the floor as per rapid response nurses note 10/21 AM She was transferred to stepdown unit and was noted on amiodarone but has been clean she is currently being treated for pneumonia  Assessment & Plan:   Principal Problem:   PNA (pneumonia) Active Problems:   Seizure disorder ([place])   Essential hypertension, benign   Chronic kidney disease (CKD), stage IV (severe) ([place])   A-fib ([place])   SVT (supraventricular tachycardia) ([place])   Palliative care by specialist   Goals of care, counseling/discussion   1. SVT likely driven by pneumonia, flutter >fibrillation CHADS2 score >7 a. Patient decompensated 10/21 with SVT/RVR -- initially on IV amiodarone transfer to stepdown-stabilized currently on amiodarone 400 twice daily-metoprolol and other medications were discontinued given low blood pressures-new NSVT b. Transfer to telemetry today 2. Anemia unclear  cause probably dilutional from resuscitation attempts on admission a. No reports of bleeding does have macrocytosis? b. Received 1 unit PRBC and hemoglobin is stabilized. 3. ?  Pneumonia concerning for aspiration a. Continue ceftriaxone.  Doxy> Azithro 2/2 riskQTC prolongation--doxycycline ceftriaxone discontinued 10/24 b. Continue dysphagia 2 diet with thin liquids as per speech c. Chest x-ray repeat shows underexpanded lungs-outpatient chest x-ray needed 4. Hypotension a. Hypotensive is now resolved b. Saline locked 10/23 5. Prior stroke with cognitive impairment a. Continue aspirin 81 mg only b. Seeking GOC with discussions per family as per bvelow 6. Acute superimposed on chronic kidney disease stage III 7. Hyperkalemia on admission a. Resolved at this time 8. Seizure disorder a. Continue Depakote 500 twice daily, Keppra 2 g at bedtime 9. Gout   DVT prophylaxis: Subcu heparin Code Status: Full CODE STATUS Family Communication: called Annaleise Burger [phone], d/w him on phone 10/24 in detail Disposition:   Status is: Observation  The patient will require care spanning > 2 midnights and should be moved to inpatient because: Persistent severe electrolyte disturbances, Ongoing active pain requiring inpatient pain management and Unsafe d/c plan  Dispo: The patient is from: Home              Anticipated d/c is to: SNF              Anticipated d/c date is: 1 day              Patient currently is not medically stable to d/c.   Consultants:   Cardiology  Procedures: None  Antimicrobials: As above   Subjective: Sleeping poorly but doing much better overall  Ambulated about 100 feet with walker No chest pain no fever still producing some sputum No other issues at this time  Objective: Vitals:   02/08/20 0400 02/08/20 0500 02/08/20 0600 02/08/20 0700  BP: (!) 127/44     Pulse: 84 85 86 64  Resp: 10 13 13 14   Temp: 98.2 F (36.8 C)     TempSrc: Oral     SpO2: 98% 99%  99% 99%  Weight:  94.6 kg    Height:        Intake/Output Summary (Last 24 hours) at 02/08/2020 0855 Last data filed at 02/08/2020 0700 Gross per 24 hour  Intake 460.06 ml  Output 900 ml  Net -439.94 ml   Filed Weights   02/04/20 0911 02/06/20 0412 02/08/20 0500  Weight: 92.6 kg 94.9 kg 94.6 kg    Examination:  General exam: Pleasant laying in bed no distress Respiratory system: No rales rhonchi at present time Cardiovascular system: S1-S2 sinus rhythm with PVCs but no SVT Gastrointestinal system: Soft, nontender no rebound no guard. Central nervous system: Neurologically intact no focal deficit to gross exam  Data Reviewed: I have personally reviewed following labs and imaging studies k 3.7-->4.3 Bun/cr 47/1.9-->37/1.3-->30/1.1 WBC 24-->17.9-->14.5 Hb 8.3-->7.4-->6.9--->8.2  Radiology Studies: DG Chest 2 View  Result Date: 02/07/2020 CLINICAL DATA:  Pneumonia EXAM: CHEST - 2 VIEW COMPARISON:  02/03/2020 FINDINGS: The cardiomediastinal silhouette is unchanged. Mild pulmonary vascular congestion is noted. Decreased bilateral LOWER lung opacities since the prior study. No pneumothorax or large pleural effusion. No acute bony abnormalities. IMPRESSION: Improved bilateral LOWER lung opacities/pneumonia. Electronically Signed   By: Margarette Canada M.D.   On: 02/07/2020 12:15     Scheduled Meds: . sodium chloride   Intravenous Once  . acetaminophen  650 mg Oral Once  . allopurinol  50 mg Oral Daily  . amiodarone  400 mg Oral BID  . aspirin  81 mg Oral Daily  . Chlorhexidine Gluconate Cloth  6 each Topical Daily  . divalproex  500 mg Oral BID  . heparin  5,000 Units Subcutaneous Q8H  . levETIRAcetam  2,000 mg Oral QHS  . loratadine  10 mg Oral Daily  . montelukast  10 mg Oral QHS   Continuous Infusions: . cefTRIAXone (ROCEPHIN)  IV Stopped (02/07/20 1825)  . doxycycline (VIBRAMYCIN) IV 125 mL/hr at 02/08/20 0700     LOS: 4 days    Time spent: 25 minutes  [name], MD [facility] To contact the attending provider between 7A-7P or the covering provider during after hours 7P-7A, please log into the web site [email or web address] and access using universal [place] password for that web site. If you do not have the password, please call the hospital operator.  02/08/2020, 8:55 AM

## 2020-02-08 NOTE — Progress Notes (Signed)
  Speech Language Pathology Treatment: Dysphagia  Patient Details Name: [name] MRN: [id number] DOB: 27-Oct-1923 Today's Date: 02/08/2020 Time: 5638-9373 SLP Time Calculation (min) (ACUTE ONLY): 21 min  Assessment / Plan / Recommendation Clinical Impression  Pt seen for ongoing dysphagia intervention following BSE completed on Friday. RN reports that Pt is eating well. Pt indicates that she has been eating self regulated regular textures prior to admission, cuts up meat/soft cooked meats due to loose fitting dentures. Pt seen with dentures placed today. She exhibited no signs of reduced airway protection, mildly prolonged oral transit with solids. She stated that she needs to take her medication "a couple at a time" and not all at once (she had a choking episode this way per Pt report). Recommend D3/mech soft and thin liquids with aspiration and reflux precautions, intermittent supervision. No further SLP services indicated at this time.    HPI HPI: 86 y.o. female  with past medical history of CKD stage 4, seizures, hypertension, atrial fibrillation not on anticoagulation because of fall risk, cognitive impairment admitted on 02/03/2020 with increased heart rate up to 160's per reports of home health physical therapist. Two recent hospitalizations for pneumonia/SVT and aflutter. In ED, CXR revealed new right basilar opacity started on Levaquin. Also found to have AKI superimposed on CKD. Rapid response called on 10/21 due to SVT, hypotension, and chest discomfort. Cardiology following. IV amiodarone initiated. Patient is not a candidate for ischemic evaluation due to age and co-morbidities. Transferred to stepdown ICU for closer monitoring.       SLP Plan  All goals met;Discharge SLP treatment due to (comment)       Recommendations  Diet recommendations: Dysphagia 3 (mechanical soft);Thin liquid Liquids provided via: Cup;Straw Medication Administration: Whole meds with liquid (Give her  just a couple at a time) Supervision: Patient able to self feed Compensations: Minimize environmental distractions;Small sips/bites;Slow rate Postural Changes and/or Swallow Maneuvers: Seated upright 90 degrees;Upright 30-60 min after meal                Oral Care Recommendations: Oral care BID Follow up Recommendations: 24 hour supervision/assistance SLP Visit Diagnosis: Dysphagia, unspecified (R13.10) Plan: All goals met;Discharge SLP treatment due to (comment)       Thank you,  Genene Churn, [place]                 [place] 02/08/2020, 3:05 PM

## 2020-02-08 NOTE — Progress Notes (Signed)
SATURATION QUALIFICATIONS: (This note is used to comply with regulatory documentation for home oxygen)  Patient Saturations on Room Air at Rest = 98%  Patient Saturations on Room Air while Ambulating = 94%  Patient Saturations on 0 Liters of oxygen while Ambulating = 94%  Please briefly explain why patient needs home oxygen:  Patient does not need oxygen at home. Oxygen stays above 93% when at rest and while ambulating.

## 2020-02-08 NOTE — Plan of Care (Signed)
  Problem: Acute Rehab OT Goals (only OT should resolve) Goal: Pt. Will Perform Upper Body Bathing Flowsheets (Taken 02/08/2020 0920) Pt Will Perform Upper Body Bathing:  with supervision  sitting Goal: Pt. Will Perform Lower Body Bathing Flowsheets (Taken 02/08/2020 0920) Pt Will Perform Lower Body Bathing:  sit to/from stand  with min assist  sitting/lateral leans Goal: Pt. Will Perform Upper Body Dressing Flowsheets (Taken 02/08/2020 0920) Pt Will Perform Upper Body Dressing:  with supervision  sitting Goal: Pt. Will Perform Lower Body Dressing Flowsheets (Taken 02/08/2020 0920) Pt Will Perform Lower Body Dressing:  with min assist  sitting/lateral leans  sit to/from stand Goal: Pt. Will Transfer To Toilet Flowsheets (Taken 02/08/2020 0920) Pt Will Transfer to Toilet:  with supervision  stand pivot transfer  bedside commode Goal: Pt. Will Perform Toileting-Clothing Manipulation Flowsheets (Taken 02/08/2020 0920) Pt Will Perform Toileting - Clothing Manipulation and hygiene:  with min assist  sitting/lateral leans  sit to/from stand

## 2020-02-08 NOTE — TOC Initial Note (Signed)
Transition of Care [facility]) - Initial/Assessment Note   Patient Details  Name: [name] MRN: [id number] Date of Birth: June 11, 1923  Transition of Care [facility]) CM/SW Contact:    Sherie Don, LCSW Phone Number: 02/08/2020, 11:21 AM  Clinical Narrative: Patient is a 86 year old female who was admitted for pneumonia. Patient has a history of seizure disorder, hypertension, chronic kidney disease, A-fib, and SVT. Readmission prevention checklist completed due to high readmission score. CSW spoke with patient's daughter, Valene Villa, regarding family's discharge plan. CSW explained that although PT and OT's evaluations recommended SNF, the patient recently discharged from SNF this month and has not had 60 days of wellness, so the patient will be in copay days. Daughter asked about LTC placement. CSW explained this will also require the patient and/or family to pay out of pocket as patient does not have Medicaid and will have to sign over her monthly check as well. Daughter to discuss options with family. TOC to follow.  Expected Discharge Plan: [place] Barriers to Discharge: Continued Medical Work up  Patient Goals and CMS Choice Patient states their goals for this hospitalization and ongoing recovery are:: Get better CMS Medicare.gov Compare Post Acute Care list provided to:: Patient Represenative (must comment) Fortunato Curling) Choice offered to / list presented to : Adult Children  Expected Discharge Plan and Services Expected Discharge Plan: [place] In-house Referral: Clinical Social Work Discharge Planning Services: NA Living arrangements for the past 2 months: [place]  Prior Living Arrangements/Services Living arrangements for the past 2 months: Single Family Home Patient language and need for interpreter reviewed:: Yes Do you feel safe going back to the place where you live?: Yes      Need for Family Participation in Patient Care:  Yes (Comment) Care giver support system in place?: Yes (comment) Current home services: DME Criminal Activity/Legal Involvement Pertinent to Current Situation/Hospitalization: No - Comment as needed  Activities of Daily Living Home Assistive Devices/Equipment: Wheelchair, Eyeglasses, Dentures (specify type) ADL Screening (condition at time of admission) Patient's cognitive ability adequate to safely complete daily activities?: Yes Is the patient deaf or have difficulty hearing?: No Does the patient have difficulty seeing, even when wearing glasses/contacts?: No Does the patient have difficulty concentrating, remembering, or making decisions?: No Patient able to express need for assistance with ADLs?: Yes Does the patient have difficulty dressing or bathing?: Yes Independently performs ADLs?: No Communication: Independent Dressing (OT): Needs assistance Is this a change from baseline?: Pre-admission baseline Grooming: Needs assistance Is this a change from baseline?: Pre-admission baseline Feeding: Independent Bathing: Needs assistance Is this a change from baseline?: Pre-admission baseline Toileting: Needs assistance Is this a change from baseline?: Pre-admission baseline In/Out Bed: Needs assistance Is this a change from baseline?: Pre-admission baseline Walks in Home: Needs assistance Is this a change from baseline?: Pre-admission baseline Does the patient have difficulty walking or climbing stairs?: Yes Weakness of Legs: Both Weakness of Arms/Hands: Both  Emotional Assessment Appearance:: Appears stated age Orientation: : Oriented to Self, Oriented to Place, Oriented to  Time, Oriented to Situation Alcohol / Substance Use: Not Applicable Psych Involvement: No (comment)  Admission diagnosis:  PNA (pneumonia) [J18.9] Community acquired pneumonia of right lower lobe of lung [J18.9] SVT (supraventricular tachycardia) ([place]) [I47.1] Patient Active Problem List   Diagnosis Date  Noted  . Palliative care by specialist   . Goals of care, counseling/discussion   . SVT (supraventricular tachycardia) ([place]) 02/04/2020  .  PNA (pneumonia) 02/03/2020  . Atrial fibrillation with RVR ([place]) 01/10/2020  . A-fib ([place]) 01/09/2020  . Pneumonia 01/04/2020  . Class 2 obesity 01/04/2020  . CKD (chronic kidney disease) stage 3, GFR 30-59 ml/min (HCC) 01/04/2020  . Nausea vomiting and diarrhea 03/12/2019  . Lung nodules 03/12/2019  . Atypical chest pain 12/31/2018  . Asthma 02/12/2017  . Chest pain 11/13/2016  . History of seizures 11/13/2016  . History of stroke 11/13/2016  . Allergic urticaria 08/30/2016  . GAD (generalized anxiety disorder) 08/30/2016  . Gout 08/30/2016  . Acute renal failure superimposed on stage 3 chronic kidney disease ([place]) 10/11/2015  . Neurogenic claudication ([place])   . Morbid obesity ([place])   . Edema   . Essential hypertension, benign 09/30/2014  . Secondary hyperparathyroidism, renal ([place]) 09/30/2014  . Proteinuria 09/30/2014  . Anemia associated with chronic renal failure 09/30/2014  . Chronic kidney disease (CKD), stage IV (severe) ([place]) 09/30/2014  . Hiatal hernia 08/21/2012  . Seizure disorder ([place]) 08/21/2012   PCP:  [name], [name] Pharmacy:   [id number] - [place], [place] [address] [place] [address] Phone: [phone] Fax: [phone]  Readmission Risk Interventions Readmission Risk Prevention Plan 02/08/2020 01/07/2020  Transportation Screening Complete Complete  PCP or Specialist Appt within 5-7 Days - Complete  Home Care Screening - Complete  Medication Review (RN CM) - Complete  HRI or Home Care Consult Complete -  Social Work Consult for Recovery Care Planning/Counseling Complete -  Palliative Care Screening Complete -  Medication Review Press photographer) Complete -  Some recent data might be hidden

## 2020-02-09 DIAGNOSIS — I4891 Unspecified atrial fibrillation: Secondary | ICD-10-CM | POA: Diagnosis not present

## 2020-02-09 DIAGNOSIS — I10 Essential (primary) hypertension: Secondary | ICD-10-CM | POA: Diagnosis not present

## 2020-02-09 DIAGNOSIS — J189 Pneumonia, unspecified organism: Secondary | ICD-10-CM | POA: Diagnosis not present

## 2020-02-09 DIAGNOSIS — N184 Chronic kidney disease, stage 4 (severe): Secondary | ICD-10-CM | POA: Diagnosis not present

## 2020-02-09 LAB — COMPREHENSIVE METABOLIC PANEL
ALT: 12 U/L (ref 0–44)
AST: 20 U/L (ref 15–41)
Albumin: 2.3 g/dL — ABNORMAL LOW (ref 3.5–5.0)
Alkaline Phosphatase: 42 U/L (ref 38–126)
Anion gap: 6 (ref 5–15)
BUN: 26 mg/dL — ABNORMAL HIGH (ref 8–23)
CO2: 24 mmol/L (ref 22–32)
Calcium: 9.5 mg/dL (ref 8.9–10.3)
Chloride: 104 mmol/L (ref 98–111)
Creatinine, Ser: 1.01 mg/dL — ABNORMAL HIGH (ref 0.44–1.00)
GFR, Estimated: 52 mL/min — ABNORMAL LOW (ref >60)
Glucose, Bld: 89 mg/dL (ref 70–99)
Potassium: 4 mmol/L (ref 3.5–5.1)
Sodium: 134 mmol/L — ABNORMAL LOW (ref 135–145)
Total Bilirubin: 0.6 mg/dL (ref 0.3–1.2)
Total Protein: 6 g/dL — ABNORMAL LOW (ref 6.5–8.1)

## 2020-02-09 LAB — CBC WITH DIFFERENTIAL/PLATELET
Abs Immature Granulocytes: 0.52 10*3/uL — ABNORMAL HIGH (ref 0.00–0.07)
Basophils Absolute: 0.3 10*3/uL — ABNORMAL HIGH (ref 0.0–0.1)
Basophils Relative: 2 %
Eosinophils Absolute: 0.3 10*3/uL (ref 0.0–0.5)
Eosinophils Relative: 2 %
HCT: 26.8 % — ABNORMAL LOW (ref 36.0–46.0)
Hemoglobin: 8.4 g/dL — ABNORMAL LOW (ref 12.0–15.0)
Immature Granulocytes: 4 %
Lymphocytes Relative: 15 %
Lymphs Abs: 2.1 10*3/uL (ref 0.7–4.0)
MCH: 23.7 pg — ABNORMAL LOW (ref 26.0–34.0)
MCHC: 31.3 g/dL (ref 30.0–36.0)
MCV: 75.5 fL — ABNORMAL LOW (ref 80.0–100.0)
Monocytes Absolute: 0.1 10*3/uL (ref 0.1–1.0)
Monocytes Relative: 1 %
Neutro Abs: 11 10*3/uL — ABNORMAL HIGH (ref 1.7–7.7)
Neutrophils Relative %: 76 %
Platelets: 369 10*3/uL (ref 150–400)
RBC: 3.55 MIL/uL — ABNORMAL LOW (ref 3.87–5.11)
RDW: 23.4 % — ABNORMAL HIGH (ref 11.5–15.5)
WBC: 14.3 10*3/uL — ABNORMAL HIGH (ref 4.0–10.5)
nRBC: 2.8 % — ABNORMAL HIGH (ref 0.0–0.2)

## 2020-02-09 MED ORDER — DIVALPROEX SODIUM 250 MG PO DR TAB
500.0000 mg | DELAYED_RELEASE_TABLET | Freq: Two times a day (BID) | ORAL | 0 refills | Status: DC
Start: 2020-02-09 — End: 2020-03-07

## 2020-02-09 MED ORDER — LEVETIRACETAM ER 500 MG PO TB24: 2000.0000 mg | ORAL_TABLET | Freq: Every day | ORAL | 0 refills | Status: AC

## 2020-02-09 MED ORDER — POLYETHYLENE GLYCOL 3350 17 G PO PACK: 17.0000 g | PACK | Freq: Every day | ORAL | 0 refills | Status: AC | PRN

## 2020-02-09 MED ORDER — AMIODARONE HCL 400 MG PO TABS: ORAL_TABLET | ORAL | 0 refills | Status: DC

## 2020-02-09 NOTE — TOC Progression Note (Signed)
Transition of Care [facility]) - Progression Note   Patient Details  Name: LABERTA WILBON MRN: [id number] Date of Birth: Mar 28, 1924  Transition of Care [facility]) CM/SW [place], LCSW Phone Number: 02/09/2020, 2:58 PM  Clinical Narrative: CSW spoke with patient's daughter, Alishah Schulte, and her son, Neelam Tiggs, regarding the discharge plan for the patient. Children informed CSW they cannot take the patient home and want her to go to SNF. Son requested CIR. CSW explained that due to patient's age and the physical therapy requirements. Family is requesting SNF and is agreeable to copay days. CSW completed FL2 and faxed out referrals. CSW called Bernadene Bell to start insurance authorization. Patient's reference ID is: [id number]. Patient received several bed offers. CSW reviewed bed offers with daughter. Daughter selected [facility]. CSW called Bernadene Bell to add [facility] to the insurance authorization. Clinicals faxed to [facility]. TOC to follow.  Expected Discharge Plan: [place] Barriers to Discharge: Continued Medical Work up  Expected Discharge Plan and Services Expected Discharge Plan: [place] In-house Referral: Clinical Social Work Discharge Planning Services: NA Living arrangements for the past 2 months: [place] Expected Discharge Date: 02/09/20                Readmission Risk Interventions Readmission Risk Prevention Plan 02/08/2020 01/07/2020  Transportation Screening Complete Complete  PCP or Specialist Appt within 5-7 Days - Complete  Home Care Screening - Complete  Medication Review (RN CM) - Complete  HRI or Home Care Consult Complete -  Social Work Consult for Recovery Care Planning/Counseling Complete -  Palliative Care Screening Complete -  Medication Review Press photographer) Complete -  Some recent data might be hidden

## 2020-02-09 NOTE — Progress Notes (Signed)
Physical Therapy Treatment Patient Details Name: [name] MRN: [id number] DOB: 13-Mar-1924 Today's Date: 02/09/2020    History of Present Illness 86 y.o. female with PMH of HTN, CKD 4, seizures, a-fib, cognitive impairment, currently brought to the ED with reports of increased heart rate up to 160s. Patient's physical therapist was at home with the patient and noticed her son called EMS. EMS gave 22 mg of Cardizem.    PT Comments    Upon arrival, pt c/o L pain/weakness without specifics; BLE dorsiflexion, hip flexion and knee extension strength symmetrical, L grip slightly weaker than R, facial smiling without deficits noted- RN reports L side pain complaints unchanged so possibly chronic. Pt agreeable and motivated to participate in skilled PT session. Pt tolerates therapeutic exercises with cues for form and motor control. Pt performs STS reps for BLE strengthening, cued for BUE assisting to power up, quad and glute activation and upright posture once upright., steadying assist for controlled rising and lowering with RW for safety. Pt declines ambulation at EOS due to fatigue from exercises and "too cold". Pt tolerates remaining up in chair with call bell and therapist provided warm blanket.    Follow Up Recommendations  SNF     Equipment Recommendations  None recommended by PT    Recommendations for Other Services       Precautions / Restrictions Precautions Precautions: Fall Restrictions Weight Bearing Restrictions: No    Mobility  Bed Mobility  General bed mobility comments: in chair upon arrival  Transfers Overall transfer level: Needs assistance Equipment used: Rolling walker (2 wheeled) Transfers: Sit to/from Stand Sit to Stand: Min guard  General transfer comment: min guard, BUE assisting to power up, rocking momentum with fatigue, no physical assist only steadying with rise and controlled lowering  Ambulation/Gait  General Gait Details: pt  declined   Chief Strategy Officer    Modified Rankin (Stroke Patients Only)       Balance Overall balance assessment: Needs assistance         Cognition Arousal/Alertness: Awake/alert Behavior During Therapy: WFL for tasks assessed/performed Overall Cognitive Status: Within Functional Limits for tasks assessed                 Exercises General Exercises - Lower Extremity Long Arc Quad: AROM;Strengthening;Seated;Both;15 reps Hip Flexion/Marching: AROM;Strengthening;Seated;Both;15 reps Toe Raises: AROM;Strengthening;Seated;Both;15 reps Heel Raises: AROM;Strengthening;Seated;Both;15 reps Other Exercises Other Exercises: STS, 3 reps x2 sets with seated rest break between    General Comments General comments (skin integrity, edema, etc.): HR max 125bpm noted with STS reps      Pertinent Vitals/Pain Pain Assessment: No/denies pain    Home Living                      Prior Function            PT Goals (current goals can now be found in the care plan section) Acute Rehab PT Goals Patient Stated Goal: return home after rehab PT Goal Formulation: With patient/family Time For Goal Achievement: 02/21/20 Potential to Achieve Goals: Good Progress towards PT goals: Progressing toward goals    Frequency    Min 3X/week      PT Plan Current plan remains appropriate    Co-evaluation              AM-PAC PT "6 Clicks" Mobility   Outcome Measure  Help needed turning from your back to your  side while in a flat bed without using bedrails?: None Help needed moving from lying on your back to sitting on the side of a flat bed without using bedrails?: A Little Help needed moving to and from a bed to a chair (including a wheelchair)?: A Little Help needed standing up from a chair using your arms (e.g., wheelchair or bedside chair)?: None Help needed to walk in hospital room?: A Lot Help needed climbing 3-5 steps with a railing? : A  Lot 6 Click Score: 18    End of Session   Activity Tolerance: Patient tolerated treatment well Patient left: in chair;with call bell/phone within reach Nurse Communication: Mobility status (provided medication at beginning of treatment) PT Visit Diagnosis: Unsteadiness on feet (R26.81);Other abnormalities of gait and mobility (R26.89);Muscle weakness (generalized) (M62.81)     Time: 1761-6073 PT Time Calculation (min) (ACUTE ONLY): 21 min  Charges:  $Therapeutic Exercise: 8-22 mins                      Tori Eathan Groman PT, DPT 02/09/20, 10:16 AM [phone]

## 2020-02-09 NOTE — Progress Notes (Signed)
Pt arrived to room #332 via bed from ICU. VSS, telemetry applied. Pt c/o discomfort in her neck, head repositioned for comfort. Pt oriented to room and safety procedures. Pt states understanding. Bed alarm on for safety, call bell in reach.

## 2020-02-09 NOTE — Discharge Summary (Addendum)
Physician Discharge Summary  [name] AJG:[id number] DOB: 1924-03-17 DOA: 02/03/2020  PCP: [name], [name]  Admit date: 02/03/2020 Discharge date: 02/10/2020  Time spent: 40 minutes  Recommendations for Outpatient Follow-up:  1. Needs CMP, cbc 1 week 2. Will need rehab placement for short term rehab pt/ot, slp 3. Recommend 2 view chest x-ray in 3-4 weeks to denote clearance of pneumonia/lung infection 4. Tapering instructions of Amiodarone on file for new onset afib--please follow labs carefully and check tsh in 1 mo and repeat CMP given needs for chronic amiodarone  Discharge Diagnoses:  Principal Problem:   PNA (pneumonia) Active Problems:   Essential hypertension, benign   A-fib (HCC)   SVT (supraventricular tachycardia) (HCC)   Palliative care by specialist   Seizure disorder ([place])   Chronic kidney disease (CKD), stage IV (severe) ([place])   Goals of care, counseling/discussion   Discharge Condition: fair  Diet recommendation: dys 3 diet  Filed Weights   02/04/20 0911 02/06/20 0412 02/08/20 0500  Weight: 92.6 kg 94.9 kg 94.6 kg    History of present illness:  86 year old black female Known history of HTN hyperlipidemia prior stroke seizures  cognitive impairment with mild dementia at baseline  Recent admission 01/09/2020 through 01/12/2020 with chest pain shortness of breath-at that time found to have atrial fibrillation CHADS2 score >6 and because of frequent falls  was elected not to start on apixaban 2/2 to high bleed risk   echo at the time showed EF 60-65% she also had a leukocytosis and was given cefdinir secondary to possible infectious process  She was working with physical therapy 10/20 and heart rate was noted to be 160s-history was obtained mainly by chart review and she was somewhat tangential-she was given Cardizem reduced dose of metoprolol and converted to sinus rhythm  Work-up in the emergency room showed new right basilar opacity of the  chest leukocytosis 23 started on Levaquin Also found to have AKI superimposed on CKD 2-3  She flipped into SVT on the floor as per rapid response nurses note 10/21 AM She was transferred to stepdown unit and was noted on amiodarone but has been clean she is currently being treated for pneumonia and she complete d course of Abx this admit  Hospital Course:  1. SVT likely driven by pneumonia, flutter >fibrillation CHADS2 score >7 a. Patient decompensated 10/21 with SVT/RVR -- initially on IV amiodarone transfer to stepdown-stabilized currently on amiodarone 400 twice daily-but will down titrate per cardiology recs to 200 bid after 1 week and then 200 qd long term b. Need sTSH and CMEt as is on Amio as above c. No metoprolol and other medications  given low blood pressures-new NSVT d. Transfer to telemetry today e. Patient hi risk for AC--would not use Any in 86 yr old for fear of bleed 2. Anemia unclear cause probably dilutional from resuscitation attempts on admission a. No reports of bleeding does have macrocytosis? b. Received 1 unit PRBC and hemoglobin is stabilized. c. Risks of colonoscopy outweigh benefit 3. ?  Pneumonia concerning for aspiration a. Continue ceftriaxone.  Doxy> Azithro 2/2 riskQTC prolongation--doxycycline ceftriaxone discontinued 10/24 b. Continue dysphagia 2 diet with thin liquids as per speech c. Chest x-ray repeat shows underexpanded lungs-outpatient chest x-ray needed 4. Hypotension a. Hypotensive is now resolved b. Saline locked 10/23 5. Prior stroke with cognitive impairment a. Continue aspirin 81 mg only b. Seeking GOC with discussions per family as per bvelow 6. Acute superimposed on chronic kidney disease stage III 7.  Hyperkalemia on admission a. Resolved at this time b. Need periodic recheck in OP setting 8. Seizure disorder a. Continue Depakote 500 twice daily, Keppra 2 g at bedtime b. Scripts given on  d/c 9. Gout  Procedures: Echo  Consultations:  cardiology  Discharge Exam: Vitals:   02/10/20 0109 02/10/20 0452  BP: 125/62 (!) 109/46  Pulse: 95 100  Resp: 18 19  Temp: 98.9 F (37.2 C) 98.9 F (37.2 C)  SpO2: 99% 93%    General: awake alert pleasant coherent  Some difficulty with swallow and some sputum but overall is improved--not really requiring oxygen Cardiovascular: s1 s2 nsr on monitors Respiratory: clinically clear no added sound no wheeze no rales rhonchi abd soft nt nd no rebound no guard LE's soft nt nd edema  Discharge Instructions   Discharge Instructions    Call MD for:  difficulty breathing, headache or visual disturbances   Complete by: As directed    Call MD for:  persistant dizziness or light-headedness   Complete by: As directed    Call MD for:  persistant nausea and vomiting   Complete by: As directed    Call MD for:  temperature >100.4   Complete by: As directed    Diet - low sodium heart healthy   Complete by: As directed    Diet - low sodium heart healthy   Complete by: As directed    Discharge instructions   Complete by: As directed    1. Needs CMP, cbc 1 week 2. Will need rehab placement for short term rehab pt/ot, slp 3. Recommend 2 view chest x-ray in 3-4 weeks to denote clearance of pneumonia/lung infection 4. Tapering instructions of Amiodarone on file for new onset afib--please follow labs carefully and check tsh in 1 mo and repeat CMP given needs for chronic amiodarone   Increase activity slowly   Complete by: As directed    Increase activity slowly   Complete by: As directed      Allergies as of 02/10/2020      Reactions   Advicor [niacin-lovastatin Er] Other (See Comments)   Reaction unknown to daughter   Caduet [amlodipine-atorvastatin] Other (See Comments)   Reaction unknown to daughter   Codeine Other (See Comments)   Reaction unknown to daughter   Dilantin [phenytoin] Other (See Comments)   Reaction unknown to  daughter   Lescol [fluvastatin] Other (See Comments)   Reaction unknown to daughter   Pamelor [nortriptyline] Other (See Comments)   Reaction unknown to daughter   Penicillins Hives   Did it involve swelling of the face/tongue/throat, SOB, or low BP? Unk Did it involve sudden or severe rash/hives, skin peeling, or any reaction on the inside of your mouth or nose? Unk Did you need to seek medical attention at a hospital or doctor's office? Unk When did it last happen? "it was a long time ago" If all above answers are "NO", may proceed with cephalosporin use.   Sulfa Antibiotics Hives   Trileptal [oxcarbazepine] Other (See Comments)   Reaction unknown to daughter   Ultram [tramadol] Other (See Comments)   Reaction unknown to daughter   Welchol [colesevelam] Other (See Comments)   Reaction unknown to daughter   Latex Rash   Tomato Rash      Medication List    STOP taking these medications   cetirizine 10 MG tablet Commonly known as: ZYRTEC   diltiazem 120 MG 24 hr capsule Commonly known as: CARDIZEM CD   ketoconazole 2 % cream  Commonly known as: NIZORAL   metolazone 2.5 MG tablet Commonly known as: ZAROXOLYN   metoprolol tartrate 25 MG tablet Commonly known as: LOPRESSOR   mometasone 0.1 % ointment Commonly known as: ELOCON   nystatin-triamcinolone ointment Commonly known as: MYCOLOG   One-A-Day Womens 50 Plus Tabs   triamcinolone cream 0.1 % Commonly known as: KENALOG     TAKE these medications   acetaminophen 500 MG tablet Commonly known as: TYLENOL Take 500-1,000 mg by mouth every 8 (eight) hours as needed for mild pain or headache.   allopurinol 100 MG tablet Commonly known as: ZYLOPRIM TAKE 1/2 TABLET BY MOUTH EVERY DAY   amiodarone 400 MG tablet Commonly known as: PACERONE Take 1 tablet (400 mg total) by mouth 2 (two) times daily for 2 days, THEN 0.5 tablets (200 mg total) 2 (two) times daily for 14 days, THEN 0.5 tablets (200 mg total) 2 (two) times  daily for 15 days, THEN 0.5 tablets (200 mg total) 2 (two) times daily. Start taking on: February 09, 2020   Arnuity Ellipta 200 MCG/ACT Aepb Generic drug: Fluticasone Furoate INHALE 1 PUFF BY MOUTH EVERY DAY What changed:   how much to take  how to take this  when to take this  additional instructions   aspirin 81 MG tablet Take 81 mg by mouth daily.   divalproex 250 MG DR tablet Commonly known as: DEPAKOTE Take 2 tablets (500 mg total) by mouth 2 (two) times daily.   famotidine 40 MG tablet Commonly known as: PEPCID Take 1 tablet (40 mg total) by mouth daily. What changed:   when to take this  reasons to take this   levETIRAcetam 500 MG 24 hr tablet Commonly known as: KEPPRA XR Take 4 tablets (2,000 mg total) by mouth at bedtime.   montelukast 10 MG tablet Commonly known as: SINGULAIR TAKE 1 TABLET BY MOUTH EVERYDAY AT BEDTIME What changed:   how much to take  how to take this  when to take this  additional instructions   nystatin 100000 UNIT/ML suspension Commonly known as: MYCOSTATIN Take 5 mLs (500,000 Units total) by mouth 4 (four) times daily.   omeprazole 40 MG capsule Commonly known as: PriLOSEC Take 1 capsule (40 mg total) by mouth daily. What changed:   when to take this  reasons to take this   polyethylene glycol 17 g packet Commonly known as: MIRALAX / GLYCOLAX Take 17 g by mouth daily as needed for mild constipation.      Allergies  Allergen Reactions  . Advicor [Niacin-Lovastatin Er] Other (See Comments)    Reaction unknown to daughter  . Caduet [Amlodipine-Atorvastatin] Other (See Comments)    Reaction unknown to daughter  . Codeine Other (See Comments)    Reaction unknown to daughter  . Dilantin [Phenytoin] Other (See Comments)    Reaction unknown to daughter  . Lescol [Fluvastatin] Other (See Comments)    Reaction unknown to daughter  . Pamelor [Nortriptyline] Other (See Comments)    Reaction unknown to daughter  .  Penicillins Hives    Did it involve swelling of the face/tongue/throat, SOB, or low BP? Unk Did it involve sudden or severe rash/hives, skin peeling, or any reaction on the inside of your mouth or nose? Unk Did you need to seek medical attention at a hospital or doctor's office? Unk When did it last happen? "it was a long time ago" If all above answers are "NO", may proceed with cephalosporin use.   . Sulfa Antibiotics Hives  .  Trileptal [Oxcarbazepine] Other (See Comments)    Reaction unknown to daughter  . Ultram [Tramadol] Other (See Comments)    Reaction unknown to daughter  . Welchol [Colesevelam] Other (See Comments)    Reaction unknown to daughter  . Latex Rash  . Tomato Rash    Contact information for after-discharge care    Destination    [facility] Preferred SNF .   Service: Skilled Nursing Contact information: [address] [place] [phone]                   The results of significant diagnostics from this hospitalization (including imaging, microbiology, ancillary and laboratory) are listed below for reference.    Significant Diagnostic Studies: DG Chest 2 View  Result Date: 02/07/2020 CLINICAL DATA:  Pneumonia EXAM: CHEST - 2 VIEW COMPARISON:  02/03/2020 FINDINGS: The cardiomediastinal silhouette is unchanged. Mild pulmonary vascular congestion is noted. Decreased bilateral LOWER lung opacities since the prior study. No pneumothorax or large pleural effusion. No acute bony abnormalities. IMPRESSION: Improved bilateral LOWER lung opacities/pneumonia. Electronically Signed   By: Margarette Canada M.D.   On: 02/07/2020 12:15   DG Chest 2 View  Result Date: 02/03/2020 CLINICAL DATA:  Atrial fibrillation EXAM: CHEST - 2 VIEW COMPARISON:  01/09/2020 FINDINGS: Stable cardiomediastinal contours. New interstitial opacity within the right lower lobe. No pleural effusion. No pneumothorax. IMPRESSION: New interstitial opacity within  the right lower lobe, suspicious for pneumonia. Electronically Signed   By: Davina Poke D.O.   On: 02/03/2020 16:26    Microbiology: Recent Results (from the past 240 hour(s))  Respiratory Panel by RT PCR (Flu A&B, Covid) - Nasopharyngeal Swab     Status: None   Collection Time: 02/03/20  5:50 PM   Specimen: Nasopharyngeal Swab  Result Value Ref Range Status   SARS Coronavirus 2 by RT PCR NEGATIVE NEGATIVE Final    Comment: (NOTE) SARS-CoV-2 target nucleic acids are NOT DETECTED.  The SARS-CoV-2 RNA is generally detectable in upper respiratoy specimens during the acute phase of infection. The lowest concentration of SARS-CoV-2 viral copies this assay can detect is 131 copies/mL. A negative result does not preclude SARS-Cov-2 infection and should not be used as the sole basis for treatment or other patient management decisions. A negative result may occur with  improper specimen collection/handling, submission of specimen other than nasopharyngeal swab, presence of viral mutation(s) within the areas targeted by this assay, and inadequate number of viral copies (<131 copies/mL). A negative result must be combined with clinical observations, patient history, and epidemiological information. The expected result is Negative.  Fact Sheet for Patients:  PinkCheek.be  Fact Sheet for Healthcare Providers:  GravelBags.it  This test is no t yet approved or cleared by the [place] FDA and  has been authorized for detection and/or diagnosis of SARS-CoV-2 by FDA under an Emergency Use Authorization (EUA). This EUA will remain  in effect (meaning this test can be used) for the duration of the COVID-19 declaration under Section 564(b)(1) of the Act, 21 U.S.C. section 360bbb-3(b)(1), unless the authorization is terminated or revoked sooner.     Influenza A by PCR NEGATIVE NEGATIVE Final   Influenza B by PCR NEGATIVE NEGATIVE  Final    Comment: (NOTE) The Xpert Xpress SARS-CoV-2/FLU/RSV assay is intended as an aid in  the diagnosis of influenza from Nasopharyngeal swab specimens and  should not be used as a sole basis for treatment. Nasal washings and  aspirates are unacceptable for  Xpert Xpress SARS-CoV-2/FLU/RSV  testing.  Fact Sheet for Patients: PinkCheek.be  Fact Sheet for Healthcare Providers: GravelBags.it  This test is not yet approved or cleared by the [place] FDA and  has been authorized for detection and/or diagnosis of SARS-CoV-2 by  FDA under an Emergency Use Authorization (EUA). This EUA will remain  in effect (meaning this test can be used) for the duration of the  Covid-19 declaration under Section 564(b)(1) of the Act, 21  U.S.C. section 360bbb-3(b)(1), unless the authorization is  terminated or revoked. Performed at [facility], [address] [place], [place] [address]   MRSA PCR Screening     Status: Abnormal   Collection Time: 02/04/20  9:00 AM   Specimen: Nasal Mucosa; Nasopharyngeal  Result Value Ref Range Status   MRSA by PCR (A) NEGATIVE Corrected    INVALID, UNABLE TO DETERMINE THE PRESENCE OF TARGET DUE TO SPECIMEN INTEGRITY. RECOLLECTION REQUESTED.    Comment: Performed at [facility], [address], [place], [place] [address]  MRSA PCR Screening     Status: None   Collection Time: 02/04/20  2:54 PM   Specimen: Nasal Mucosa; Nasopharyngeal  Result Value Ref Range Status   MRSA by PCR NEGATIVE NEGATIVE Final    Comment:        The GeneXpert MRSA Assay (FDA approved for NASAL specimens only), is one component of a comprehensive MRSA colonization surveillance program. It is not intended to diagnose MRSA infection nor to guide or monitor treatment for MRSA infections. Performed at [facility], [address], [place], [place] [address]   SARS Coronavirus 2 by RT PCR (hospital order, performed in [facility] hospital lab) Nasopharyngeal Nasopharyngeal Swab     Status: None   Collection Time: 02/08/20  9:00 AM   Specimen: Nasopharyngeal Swab  Result Value Ref Range Status   SARS Coronavirus 2 NEGATIVE NEGATIVE Final    Comment: (NOTE) SARS-CoV-2 target nucleic acids are NOT DETECTED.  The SARS-CoV-2 RNA is generally detectable in upper and lower respiratory specimens during the acute phase of infection. The lowest concentration of SARS-CoV-2 viral copies this assay can detect is 250 copies / mL. A negative result does not preclude SARS-CoV-2 infection and should not be used as the sole basis for treatment or other patient management decisions.  A negative result may occur with improper specimen collection / handling, submission of specimen other than nasopharyngeal swab, presence of viral mutation(s) within the areas targeted by this assay, and inadequate number of viral copies (<250 copies / mL). A negative result must be combined with clinical observations, patient history, and epidemiological information.  Fact Sheet for Patients:   StrictlyIdeas.no  Fact Sheet for Healthcare Providers: BankingDealers.co.za  This test is not yet approved or  cleared by the [place] FDA and has been authorized for detection and/or diagnosis of SARS-CoV-2 by FDA under an Emergency Use Authorization (EUA).  This EUA will remain in effect (meaning this test can be used) for the duration of the COVID-19 declaration under Section 564(b)(1) of the Act, 21 U.S.C. section 360bbb-3(b)(1), unless the authorization is terminated or revoked sooner.  Performed at [facility], [address], [place], [place] [address]      Labs: Basic Metabolic Panel: Recent Labs  Lab 02/05/20 0757 02/05/20 0757 02/06/20 0458 02/07/20 0336 02/08/20 0405 02/09/20 0334 02/10/20 0318  NA 136   < > 135 133* 134* 134* 135  K 3.7   < > 4.0 3.9 4.3 4.0 4.4  CL  104    < > 104 102 102 104 104  CO2 23   < > 22 22 23 24  20*  GLUCOSE 92   < > 86 84 92 89 65*  BUN 37*   < > 33* 33* 30* 26* 26*  CREATININE 1.35*   < > 1.46* 1.43* 1.19* 1.01* 1.00  CALCIUM 8.7*   < > 8.8* 9.1 9.4 9.5 9.4  MG 1.7  --  1.7  --   --   --   --    < > = values in this interval not displayed.   Liver Function Tests: Recent Labs  Lab 02/06/20 0458 02/07/20 0336 02/08/20 0405 02/09/20 0334 02/10/20 0318  AST 18 21 18 20 23   ALT 13 13 13 12 13   ALKPHOS 43 46 43 42 46  BILITOT 0.7 0.7 0.5 0.6 0.6  PROT 5.7* 6.3* 6.1* 6.0* 6.2*  ALBUMIN 2.1* 2.4* 2.4* 2.3* 2.4*   No results for input(s): LIPASE, AMYLASE in the last 168 hours. No results for input(s): AMMONIA in the last 168 hours. CBC: Recent Labs  Lab 02/06/20 0458 02/07/20 0336 02/08/20 0405 02/09/20 0334 02/10/20 0318  WBC 16.6* 15.7* 14.5* 14.3* 15.7*  NEUTROABS 12.4* 11.6* 11.9* 11.0* 11.5*  HGB 6.9* 8.3* 8.2* 8.4* 9.3*  HCT 22.3* 27.2* 26.2* 26.8* 30.0*  MCV 74.1* 76.2* 75.1* 75.5* 76.3*  PLT 367 225 361 369 330   Cardiac Enzymes: No results for input(s): CKTOTAL, CKMB, CKMBINDEX, TROPONINI in the last 168 hours. BNP: BNP (last 3 results) No results for input(s): BNP in the last 8760 hours.  ProBNP (last 3 results) No results for input(s): PROBNP in the last 8760 hours.  CBG: No results for input(s): GLUCAP in the last 168 hours.  Signed:  Roxan Hockey MD   [facility] 02/10/2020, 10:33 AM

## 2020-02-09 NOTE — NC FL2 (Signed)
[place] LEVEL OF CARE SCREENING TOOL     IDENTIFICATION  Patient Name: [name] Birthdate: September 19, 1923 Sex: female Admission Date (Current Location): 02/03/2020  [facility] and [place] Number:  [employer] and Address:  [facility] [address] [place]      Provider Number: [id number]  Attending Physician Name and Address:  [name], MD  Relative Name and Phone Number:  Joselle Deeds (daughter) Ph: [phone]    Current Level of Care: Hospital Recommended Level of Care: [place] Prior Approval Number:    Date Approved/Denied:   PASRR Number: [id number]  Discharge Plan: SNF    Current Diagnoses: Patient Active Problem List   Diagnosis Date Noted  . Palliative care by specialist   . Goals of care, counseling/discussion   . SVT (supraventricular tachycardia) ([place]) 02/04/2020  . PNA (pneumonia) 02/03/2020  . Atrial fibrillation with RVR ([place]) 01/10/2020  . A-fib ([place]) 01/09/2020  . Pneumonia 01/04/2020  . Class 2 obesity 01/04/2020  . CKD (chronic kidney disease) stage 3, GFR 30-59 ml/min (HCC) 01/04/2020  . Nausea vomiting and diarrhea 03/12/2019  . Lung nodules 03/12/2019  . Atypical chest pain 12/31/2018  . Asthma 02/12/2017  . Chest pain 11/13/2016  . History of seizures 11/13/2016  . History of stroke 11/13/2016  . Allergic urticaria 08/30/2016  . GAD (generalized anxiety disorder) 08/30/2016  . Gout 08/30/2016  . Acute renal failure superimposed on stage 3 chronic kidney disease ([place]) 10/11/2015  . Neurogenic claudication ([place])   . Morbid obesity ([place])   . Edema   . Essential hypertension, benign 09/30/2014  . Secondary hyperparathyroidism, renal ([place]) 09/30/2014  . Proteinuria 09/30/2014  . Anemia associated with chronic renal failure 09/30/2014  . Chronic kidney disease (CKD), stage IV (severe) ([place]) 09/30/2014  . Hiatal hernia 08/21/2012  . Seizure disorder ([place])  08/21/2012    Orientation RESPIRATION BLADDER Height & Weight     Self, Time, Situation, Place  O2 Incontinent Weight: 208 lb 8.9 oz (94.6 kg) Height:  5\' 6"  (167.6 cm)  BEHAVIORAL SYMPTOMS/MOOD NEUROLOGICAL BOWEL NUTRITION STATUS    Convulsions/Seizures Incontinent Diet (Dysphagia diet)  AMBULATORY STATUS COMMUNICATION OF NEEDS Skin   Extensive Assist Verbally Normal                       Personal Care Assistance Level of Assistance  Bathing, Feeding, Dressing Bathing Assistance: Limited assistance Feeding assistance: Independent Dressing Assistance: Limited assistance     Functional Limitations Info  Sight, Hearing, Speech Sight Info: Impaired Hearing Info: Adequate Speech Info: Adequate    SPECIAL CARE FACTORS FREQUENCY  PT (By licensed PT)     PT Frequency: 5x's/week              Contractures Contractures Info: Not present    Additional Factors Info  Code Status, Allergies, Psychotropic Code Status Info: Full Allergies Info: Advicor (Niacin-lovastatin Er); Caduet (Amlodipine-atorvastatin); Codeine; Dilantin (Phenytoin); Lescol (Fluvastatin); Pamelor (Nortriptyline); Penicillins; Sulfa Antibiotics; Trileptal (Oxcarbazepine); Ultram (Tramadol); Welchol (Colesevelam); Latex; Tomato Psychotropic Info: Depakote         Current Medications (02/09/2020):  This is the current hospital active medication list Current Facility-Administered Medications  Medication Dose Route Frequency Provider Last Rate Last Admin  . 0.9 %  sodium chloride infusion (Manually program via Guardrails IV Fluids)   Intravenous Once [name], MD      . acetaminophen (TYLENOL) tablet 650 mg  650 mg Oral Q6H PRN Samtani,  Jai-Gurmukh, MD   650 mg at 02/07/20 0753   Or  . acetaminophen (TYLENOL) suppository 650 mg  650 mg Rectal Q6H PRN [name], MD      . acetaminophen (TYLENOL) tablet 650 mg  650 mg Oral Once [name], MD      . allopurinol (ZYLOPRIM)  tablet 50 mg  50 mg Oral Daily [name], MD   50 mg at 02/09/20 0923  . amiodarone (PACERONE) tablet 400 mg  400 mg Oral BID Arnoldo Lenis, MD   400 mg at 02/09/20 0258  . aspirin chewable tablet 81 mg  81 mg Oral Daily [name], MD   81 mg at 02/09/20 0923  . Chlorhexidine Gluconate Cloth 2 % PADS 6 each  6 each Topical Daily [name], MD   6 each at 02/09/20 0924  . divalproex (DEPAKOTE) DR tablet 500 mg  500 mg Oral BID [name], MD   500 mg at 02/09/20 0923  . famotidine (PEPCID) tablet 20 mg  20 mg Oral Daily PRN [name], MD   20 mg at 02/06/20 1526  . heparin injection 5,000 Units  5,000 Units Subcutaneous Q8H [name], MD   5,000 Units at 02/08/20 0616  . levETIRAcetam (KEPPRA XR) 24 hr tablet 2,000 mg  2,000 mg Oral QHS [name], MD   2,000 mg at 02/08/20 2250  . loratadine (CLARITIN) tablet 10 mg  10 mg Oral Daily [name], MD   10 mg at 02/09/20 0923  . montelukast (SINGULAIR) tablet 10 mg  10 mg Oral QHS [name], MD   10 mg at 02/08/20 2251  . nitroGLYCERIN (NITROSTAT) SL tablet 0.4 mg  0.4 mg Sublingual Q5 min PRN [name], MD      . ondansetron (ZOFRAN) tablet 4 mg  4 mg Oral Q6H PRN [name], MD   4 mg at 02/05/20 2203   Or  . ondansetron (ZOFRAN) injection 4 mg  4 mg Intravenous Q6H PRN [name], MD   4 mg at 02/05/20 0506  . polyethylene glycol (MIRALAX / GLYCOLAX) packet 17 g  17 g Oral Daily PRN [name], MD   17 g at 02/05/20 1745     Discharge Medications: Please see discharge summary for a list of discharge medications.  Relevant Imaging Results:  Relevant Lab Results:   Additional Information SSN: [id number]  Sherie Don, LCSW

## 2020-02-10 DIAGNOSIS — I10 Essential (primary) hypertension: Secondary | ICD-10-CM | POA: Diagnosis not present

## 2020-02-10 DIAGNOSIS — I471 Supraventricular tachycardia: Secondary | ICD-10-CM | POA: Diagnosis not present

## 2020-02-10 DIAGNOSIS — G4089 Other seizures: Secondary | ICD-10-CM | POA: Diagnosis not present

## 2020-02-10 DIAGNOSIS — J189 Pneumonia, unspecified organism: Secondary | ICD-10-CM | POA: Diagnosis not present

## 2020-02-10 DIAGNOSIS — Z515 Encounter for palliative care: Secondary | ICD-10-CM | POA: Diagnosis not present

## 2020-02-10 DIAGNOSIS — I69318 Other symptoms and signs involving cognitive functions following cerebral infarction: Secondary | ICD-10-CM | POA: Diagnosis not present

## 2020-02-10 DIAGNOSIS — J45909 Unspecified asthma, uncomplicated: Secondary | ICD-10-CM | POA: Diagnosis not present

## 2020-02-10 DIAGNOSIS — I69398 Other sequelae of cerebral infarction: Secondary | ICD-10-CM | POA: Diagnosis not present

## 2020-02-10 DIAGNOSIS — R000 Tachycardia, unspecified: Secondary | ICD-10-CM | POA: Diagnosis not present

## 2020-02-10 DIAGNOSIS — M109 Gout, unspecified: Secondary | ICD-10-CM | POA: Diagnosis not present

## 2020-02-10 DIAGNOSIS — H5710 Ocular pain, unspecified eye: Secondary | ICD-10-CM | POA: Diagnosis not present

## 2020-02-10 DIAGNOSIS — M545 Low back pain, unspecified: Secondary | ICD-10-CM | POA: Diagnosis not present

## 2020-02-10 DIAGNOSIS — G8929 Other chronic pain: Secondary | ICD-10-CM | POA: Diagnosis not present

## 2020-02-10 DIAGNOSIS — I639 Cerebral infarction, unspecified: Secondary | ICD-10-CM | POA: Diagnosis not present

## 2020-02-10 DIAGNOSIS — N1832 Chronic kidney disease, stage 3b: Secondary | ICD-10-CM | POA: Diagnosis not present

## 2020-02-10 DIAGNOSIS — R7989 Other specified abnormal findings of blood chemistry: Secondary | ICD-10-CM | POA: Diagnosis not present

## 2020-02-10 DIAGNOSIS — Z5181 Encounter for therapeutic drug level monitoring: Secondary | ICD-10-CM | POA: Diagnosis not present

## 2020-02-10 DIAGNOSIS — N189 Chronic kidney disease, unspecified: Secondary | ICD-10-CM | POA: Diagnosis not present

## 2020-02-10 DIAGNOSIS — I4891 Unspecified atrial fibrillation: Secondary | ICD-10-CM | POA: Diagnosis not present

## 2020-02-10 DIAGNOSIS — I129 Hypertensive chronic kidney disease with stage 1 through stage 4 chronic kidney disease, or unspecified chronic kidney disease: Secondary | ICD-10-CM | POA: Diagnosis not present

## 2020-02-10 DIAGNOSIS — J188 Other pneumonia, unspecified organism: Secondary | ICD-10-CM | POA: Diagnosis not present

## 2020-02-10 DIAGNOSIS — I959 Hypotension, unspecified: Secondary | ICD-10-CM | POA: Diagnosis not present

## 2020-02-10 DIAGNOSIS — M1991 Primary osteoarthritis, unspecified site: Secondary | ICD-10-CM | POA: Diagnosis not present

## 2020-02-10 DIAGNOSIS — Z7189 Other specified counseling: Secondary | ICD-10-CM | POA: Diagnosis not present

## 2020-02-10 DIAGNOSIS — G40909 Epilepsy, unspecified, not intractable, without status epilepticus: Secondary | ICD-10-CM | POA: Diagnosis not present

## 2020-02-10 DIAGNOSIS — N184 Chronic kidney disease, stage 4 (severe): Secondary | ICD-10-CM | POA: Diagnosis not present

## 2020-02-10 DIAGNOSIS — B370 Candidal stomatitis: Secondary | ICD-10-CM | POA: Diagnosis not present

## 2020-02-10 DIAGNOSIS — D649 Anemia, unspecified: Secondary | ICD-10-CM | POA: Diagnosis not present

## 2020-02-10 DIAGNOSIS — M1A9XX0 Chronic gout, unspecified, without tophus (tophi): Secondary | ICD-10-CM | POA: Diagnosis not present

## 2020-02-10 LAB — CBC WITH DIFFERENTIAL/PLATELET
Abs Immature Granulocytes: 0.58 10*3/uL — ABNORMAL HIGH (ref 0.00–0.07)
Basophils Absolute: 0.5 10*3/uL — ABNORMAL HIGH (ref 0.0–0.1)
Basophils Relative: 3 %
Eosinophils Absolute: 0.4 10*3/uL (ref 0.0–0.5)
Eosinophils Relative: 2 %
HCT: 30 % — ABNORMAL LOW (ref 36.0–46.0)
Hemoglobin: 9.3 g/dL — ABNORMAL LOW (ref 12.0–15.0)
Immature Granulocytes: 4 %
Lymphocytes Relative: 17 %
Lymphs Abs: 2.6 10*3/uL (ref 0.7–4.0)
MCH: 23.7 pg — ABNORMAL LOW (ref 26.0–34.0)
MCHC: 31 g/dL (ref 30.0–36.0)
MCV: 76.3 fL — ABNORMAL LOW (ref 80.0–100.0)
Monocytes Absolute: 0.2 10*3/uL (ref 0.1–1.0)
Monocytes Relative: 1 %
Neutro Abs: 11.5 10*3/uL — ABNORMAL HIGH (ref 1.7–7.7)
Neutrophils Relative %: 73 %
Platelets: 330 10*3/uL (ref 150–400)
RBC: 3.93 MIL/uL (ref 3.87–5.11)
RDW: 24.1 % — ABNORMAL HIGH (ref 11.5–15.5)
WBC: 15.7 10*3/uL — ABNORMAL HIGH (ref 4.0–10.5)
nRBC: 3.2 % — ABNORMAL HIGH (ref 0.0–0.2)

## 2020-02-10 LAB — COMPREHENSIVE METABOLIC PANEL
ALT: 13 U/L (ref 0–44)
AST: 23 U/L (ref 15–41)
Albumin: 2.4 g/dL — ABNORMAL LOW (ref 3.5–5.0)
Alkaline Phosphatase: 46 U/L (ref 38–126)
Anion gap: 11 (ref 5–15)
BUN: 26 mg/dL — ABNORMAL HIGH (ref 8–23)
CO2: 20 mmol/L — ABNORMAL LOW (ref 22–32)
Calcium: 9.4 mg/dL (ref 8.9–10.3)
Chloride: 104 mmol/L (ref 98–111)
Creatinine, Ser: 1 mg/dL (ref 0.44–1.00)
GFR, Estimated: 52 mL/min — ABNORMAL LOW (ref >60)
Glucose, Bld: 65 mg/dL — ABNORMAL LOW (ref 70–99)
Potassium: 4.4 mmol/L (ref 3.5–5.1)
Sodium: 135 mmol/L (ref 135–145)
Total Bilirubin: 0.6 mg/dL (ref 0.3–1.2)
Total Protein: 6.2 g/dL — ABNORMAL LOW (ref 6.5–8.1)

## 2020-02-10 MED ORDER — LOPERAMIDE HCL 2 MG PO CAPS: 4.0000 mg | ORAL_CAPSULE | Freq: Once | ORAL | Status: DC

## 2020-02-10 NOTE — Progress Notes (Signed)
Patient Demographics:    [name], is a 86 y.o. female, DOB - 23-Nov-1923, PYP:[id number]  Admit date - 02/03/2020   Admitting Physician [name], MD  Outpatient Primary MD for the patient is [name] Balloon, [name]  LOS - 6   Chief Complaint  Patient presents with  . Atrial Fibrillation        Subjective:    [name] today has no fevers, no emesis,  No chest pain,   No new complaints  Assessment  & Plan :    Principal Problem:   PNA (pneumonia) Active Problems:   Essential hypertension, benign   A-fib (HCC)   SVT (supraventricular tachycardia) (HCC)   Palliative care by specialist   Seizure disorder ([place])   Chronic kidney disease (CKD), stage IV (severe) (HCC)   Goals of care, counseling/discussion   History of present illness:  86 year old black female Known history of HTN hyperlipidemia prior stroke seizures  cognitive impairmentwith mild dementia at baseline  Recent admission 01/09/2020 through 01/12/2020 with chest pain shortness of breath-at that time found to have atrial fibrillation CHADS2 score >6 and because of frequent falls  was elected not to start on apixaban 2/2 to high bleed risk  echo at the time showed EF 60-65% she also had a leukocytosis and was given cefdinir secondary to possible infectious process  She was working with physical therapy 10/20 and heart rate was noted to be 160s-history was obtained mainly by chart review and she was somewhat tangential-she was given Cardizem reduced dose of metoprolol and converted to sinus rhythm  Work-up in the emergency room showed new right basilar opacity of the chest leukocytosis 23 started on Levaquin Also found to have AKI superimposed on CKD 2-3  She flipped into SVT on the floor as per rapid response nurses note 10/21 AM She was transferred to stepdown unit and was noted on amiodarone but has been  clean she is currently being treated for pneumonia and she complete d course of Abx this admit  Hospital Course:  1. SVT likely driven by pneumonia, flutter >fibrillation CHADS2 score >7 a. Patient decompensated 10/21 with SVT/RVR -- initially on IV amiodarone transfer to stepdown-stabilized currently on amiodarone 400 twice daily-but will down titrate per cardiology recs to 200 bid after 1 week and then 200 qd long term b. Need sTSH and CMEt as outpatient due to Amiodarone use c. No metoprolol and other medications  given low blood pressures-new NSVT d. Patient and family after discussing risk versus benefit of anticoagulation at this time declined for anticoagulation   2. Anemia unclear cause probably dilutional from resuscitation attempts on admission a. No reports of bleeding does have macrocytosis? b. Received 1 unit PRBCand hemoglobin is stabilized. c. Risks of colonoscopy outweigh benefit 3. ? Pneumonia concerning for aspiration a. Patient was treated with ceftriaxone. Doxy>Azithro 2/2 riskQTC prolongation--doxycycline ceftriaxone discontinued 10/24 b. Continue dysphagia 2 diet with thin liquids as per speech Chest x-rayrepeat shows underexpanded lungs-outpatient chest x-ray in 3 to 4 weeks advised   4. hypotension a. Hypotensiveis now resolved b. Saline locked 10/23  5. Prior stroke with cognitive impairment a. Continue aspirin 81 mg only  6. AKI on CKD 3 A with hyperkalemia -hyperkalemia has resolved, renal function is back  to baseline  7. Seizure disorder a. Continue Depakote 500 twice daily, Keppra 2 g at bedtime b. Scripts given on d/c  8. Gout  Procedures: Echo  Consultations:  cardiology  Disposition--discharge to SNF rehab   Code Status : full  Family Communication:    (patient is alert, awake and coherent)    Consults  :  cardiology  Lab Results  Component Value Date   PLT 330 02/10/2020    Inpatient Medications  Scheduled Meds: .  sodium chloride   Intravenous Once  . acetaminophen  650 mg Oral Once  . allopurinol  50 mg Oral Daily  . amiodarone  400 mg Oral BID  . aspirin  81 mg Oral Daily  . Chlorhexidine Gluconate Cloth  6 each Topical Daily  . divalproex  500 mg Oral BID  . heparin  5,000 Units Subcutaneous Q8H  . levETIRAcetam  2,000 mg Oral QHS  . loratadine  10 mg Oral Daily  . montelukast  10 mg Oral QHS   Continuous Infusions: PRN Meds:.acetaminophen **OR** acetaminophen, famotidine, nitroGLYCERIN, ondansetron **OR** ondansetron (ZOFRAN) IV, polyethylene glycol    Anti-infectives (From admission, onward)   Start     Dose/Rate Route Frequency Ordered Stop   02/04/20 1800  azithromycin (ZITHROMAX) 500 mg in sodium chloride 0.9 % 250 mL IVPB  Status:  Discontinued        500 mg 250 mL/hr over 60 Minutes Intravenous Every 24 hours 02/03/20 1948 02/04/20 1000   02/04/20 1800  cefTRIAXone (ROCEPHIN) 1 g in sodium chloride 0.9 % 100 mL IVPB  Status:  Discontinued        1 g 200 mL/hr over 30 Minutes Intravenous Every 24 hours 02/03/20 1948 02/08/20 0859   02/04/20 1800  doxycycline (VIBRAMYCIN) 100 mg in sodium chloride 0.9 % 250 mL IVPB  Status:  Discontinued        100 mg 125 mL/hr over 120 Minutes Intravenous Every 12 hours 02/04/20 1000 02/08/20 0859   02/03/20 1730  levofloxacin (LEVAQUIN) IVPB 750 mg        750 mg 100 mL/hr over 90 Minutes Intravenous  Once 02/03/20 1723 02/03/20 1916        Objective:   Vitals:   02/09/20 2110 02/09/20 2134 02/10/20 0109 02/10/20 0452  BP: 137/70 (!) 146/61 125/62 (!) 109/46  Pulse: 91 89 95 100  Resp: 18 18 18 19   Temp: 98.6 F (37 C) 98.5 F (36.9 C) 98.9 F (37.2 C) 98.9 F (37.2 C)  TempSrc:  Oral Oral   SpO2: 99% 100% 99% 93%  Weight:      Height:        Wt Readings from Last 3 Encounters:  02/08/20 94.6 kg  01/11/20 97.9 kg  01/04/20 97.4 kg    Intake/Output Summary (Last 24 hours) at 02/10/2020 1038 Last data filed at 02/10/2020  0900 Gross per 24 hour  Intake 240 ml  Output --  Net 240 ml   Physical Exam  Gen:- Awake Alert, elderly, chronically ill-appearing HEENT:- [place].AT, No sclera icterus Neck-Supple Neck,No JVD,.  Lungs-fair air movement, no wheezing CV- S1, S2 normal, irregular  Abd-  +ve B.Sounds, Abd Soft, No tenderness,    Extremity/Skin:-  , pedal pulses present  Psych-affect is appropriate, oriented x3 Neuro-generalized weakness, no new focal deficits, no tremors   Data Review:   Micro Results Recent Results (from the past 240 hour(s))  Respiratory Panel by RT PCR (Flu A&B, Covid) - Nasopharyngeal Swab     Status:  None   Collection Time: 02/03/20  5:50 PM   Specimen: Nasopharyngeal Swab  Result Value Ref Range Status   SARS Coronavirus 2 by RT PCR NEGATIVE NEGATIVE Final    Comment: (NOTE) SARS-CoV-2 target nucleic acids are NOT DETECTED.  The SARS-CoV-2 RNA is generally detectable in upper respiratoy specimens during the acute phase of infection. The lowest concentration of SARS-CoV-2 viral copies this assay can detect is 131 copies/mL. A negative result does not preclude SARS-Cov-2 infection and should not be used as the sole basis for treatment or other patient management decisions. A negative result may occur with  improper specimen collection/handling, submission of specimen other than nasopharyngeal swab, presence of viral mutation(s) within the areas targeted by this assay, and inadequate number of viral copies (<131 copies/mL). A negative result must be combined with clinical observations, patient history, and epidemiological information. The expected result is Negative.  Fact Sheet for Patients:  PinkCheek.be  Fact Sheet for Healthcare Providers:  GravelBags.it  This test is no t yet approved or cleared by the [place] FDA and  has been authorized for detection and/or diagnosis of SARS-CoV-2 by FDA under an  Emergency Use Authorization (EUA). This EUA will remain  in effect (meaning this test can be used) for the duration of the COVID-19 declaration under Section 564(b)(1) of the Act, 21 U.S.C. section 360bbb-3(b)(1), unless the authorization is terminated or revoked sooner.     Influenza A by PCR NEGATIVE NEGATIVE Final   Influenza B by PCR NEGATIVE NEGATIVE Final    Comment: (NOTE) The Xpert Xpress SARS-CoV-2/FLU/RSV assay is intended as an aid in  the diagnosis of influenza from Nasopharyngeal swab specimens and  should not be used as a sole basis for treatment. Nasal washings and  aspirates are unacceptable for Xpert Xpress SARS-CoV-2/FLU/RSV  testing.  Fact Sheet for Patients: PinkCheek.be  Fact Sheet for Healthcare Providers: GravelBags.it  This test is not yet approved or cleared by the [place] FDA and  has been authorized for detection and/or diagnosis of SARS-CoV-2 by  FDA under an Emergency Use Authorization (EUA). This EUA will remain  in effect (meaning this test can be used) for the duration of the  Covid-19 declaration under Section 564(b)(1) of the Act, 21  U.S.C. section 360bbb-3(b)(1), unless the authorization is  terminated or revoked. Performed at [facility], [address], [place], [place] [address]   MRSA PCR Screening     Status: Abnormal   Collection Time: 02/04/20  9:00 AM   Specimen: Nasal Mucosa; Nasopharyngeal  Result Value Ref Range Status   MRSA by PCR (A) NEGATIVE Corrected    INVALID, UNABLE TO DETERMINE THE PRESENCE OF TARGET DUE TO SPECIMEN INTEGRITY. RECOLLECTION REQUESTED.    Comment: Performed at [facility], [address] [place], [place] [address]  MRSA PCR Screening     Status: None   Collection Time: 02/04/20  2:54 PM   Specimen: Nasal Mucosa; Nasopharyngeal  Result Value Ref Range Status   MRSA by PCR NEGATIVE NEGATIVE Final    Comment:        The GeneXpert MRSA Assay  (FDA approved for NASAL specimens only), is one component of a comprehensive MRSA colonization surveillance program. It is not intended to diagnose MRSA infection nor to guide or monitor treatment for MRSA infections. Performed at [facility], [address], [place], [place] [address]   SARS Coronavirus 2 by RT PCR (hospital order, performed in [facility] hospital lab) Nasopharyngeal Nasopharyngeal Swab  Status: None   Collection Time: 02/08/20  9:00 AM   Specimen: Nasopharyngeal Swab  Result Value Ref Range Status   SARS Coronavirus 2 NEGATIVE NEGATIVE Final    Comment: (NOTE) SARS-CoV-2 target nucleic acids are NOT DETECTED.  The SARS-CoV-2 RNA is generally detectable in upper and lower respiratory specimens during the acute phase of infection. The lowest concentration of SARS-CoV-2 viral copies this assay can detect is 250 copies / mL. A negative result does not preclude SARS-CoV-2 infection and should not be used as the sole basis for treatment or other patient management decisions.  A negative result may occur with improper specimen collection / handling, submission of specimen other than nasopharyngeal swab, presence of viral mutation(s) within the areas targeted by this assay, and inadequate number of viral copies (<250 copies / mL). A negative result must be combined with clinical observations, patient history, and epidemiological information.  Fact Sheet for Patients:   StrictlyIdeas.no  Fact Sheet for Healthcare Providers: BankingDealers.co.za  This test is not yet approved or  cleared by the [place] FDA and has been authorized for detection and/or diagnosis of SARS-CoV-2 by FDA under an Emergency Use Authorization (EUA).  This EUA will remain in effect (meaning this test can be used) for the duration of the COVID-19 declaration under Section 564(b)(1) of the Act, 21 U.S.C. section 360bbb-3(b)(1), unless the  authorization is terminated or revoked sooner.  Performed at [facility], [address] [place], [place] [address]     Radiology Reports DG Chest 2 View  Result Date: 02/07/2020 CLINICAL DATA:  Pneumonia EXAM: CHEST - 2 VIEW COMPARISON:  02/03/2020 FINDINGS: The cardiomediastinal silhouette is unchanged. Mild pulmonary vascular congestion is noted. Decreased bilateral LOWER lung opacities since the prior study. No pneumothorax or large pleural effusion. No acute bony abnormalities. IMPRESSION: Improved bilateral LOWER lung opacities/pneumonia. Electronically Signed   By: Margarette Canada M.D.   On: 02/07/2020 12:15   DG Chest 2 View  Result Date: 02/03/2020 CLINICAL DATA:  Atrial fibrillation EXAM: CHEST - 2 VIEW COMPARISON:  01/09/2020 FINDINGS: Stable cardiomediastinal contours. New interstitial opacity within the right lower lobe. No pleural effusion. No pneumothorax. IMPRESSION: New interstitial opacity within the right lower lobe, suspicious for pneumonia. Electronically Signed   By: Davina Poke D.O.   On: 02/03/2020 16:26     CBC Recent Labs  Lab 02/06/20 0458 02/07/20 0336 02/08/20 0405 02/09/20 0334 02/10/20 0318  WBC 16.6* 15.7* 14.5* 14.3* 15.7*  HGB 6.9* 8.3* 8.2* 8.4* 9.3*  HCT 22.3* 27.2* 26.2* 26.8* 30.0*  PLT 367 225 361 369 330  MCV 74.1* 76.2* 75.1* 75.5* 76.3*  MCH 22.9* 23.2* 23.5* 23.7* 23.7*  MCHC 30.9 30.5 31.3 31.3 31.0  RDW 21.3* 22.2* 23.0* 23.4* 24.1*  LYMPHSABS 2.9 3.5 1.6 2.1 2.6  MONOABS 0.1 0.0* 0.6 0.1 0.2  EOSABS 0.2 0.3 0.4 0.3 0.4  BASOSABS 0.4* 0.3* 0.0 0.3* 0.5*    Chemistries  Recent Labs  Lab 02/05/20 0757 02/05/20 0757 02/06/20 0458 02/07/20 0336 02/08/20 0405 02/09/20 0334 02/10/20 0318  NA 136   < > 135 133* 134* 134* 135  K 3.7   < > 4.0 3.9 4.3 4.0 4.4  CL 104   < > 104 102 102 104 104  CO2 23   < > 22 22 23 24  20*  GLUCOSE 92   < > 86 84 92 89 65*  BUN 37*   < > 33* 33* 30* 26* 26*  CREATININE 1.35*   < >  1.46*  1.43* 1.19* 1.01* 1.00  CALCIUM 8.7*   < > 8.8* 9.1 9.4 9.5 9.4  MG 1.7  --  1.7  --   --   --   --   AST 18   < > 18 21 18 20 23   ALT 14   < > 13 13 13 12 13   ALKPHOS 45   < > 43 46 43 42 46  BILITOT 0.5   < > 0.7 0.7 0.5 0.6 0.6   < > = values in this interval not displayed.   ------------------------------------------------------------------------------------------------------------------ No results for input(s): CHOL, HDL, LDLCALC, TRIG, CHOLHDL, LDLDIRECT in the last 72 hours.  Lab Results  Component Value Date   HGBA1C 5.8% 01/07/2013   ------------------------------------------------------------------------------------------------------------------ No results for input(s): TSH, T4TOTAL, T3FREE, THYROIDAB in the last 72 hours.  Invalid input(s): FREET3 ------------------------------------------------------------------------------------------------------------------ No results for input(s): VITAMINB12, FOLATE, FERRITIN, TIBC, IRON, RETICCTPCT in the last 72 hours.  Coagulation profile No results for input(s): INR, PROTIME in the last 168 hours.  No results for input(s): DDIMER in the last 72 hours.  Cardiac Enzymes No results for input(s): CKMB, TROPONINI, MYOGLOBIN in the last 168 hours.  Invalid input(s): CK ------------------------------------------------------------------------------------------------------------------ No results found for: BNP   Roxan Hockey M.D on 02/10/2020 at 10:38 AM  Go to [email or web address] - for contact info  [facility] - Office  [phone]

## 2020-02-10 NOTE — Progress Notes (Signed)
Has had four loose stools today.  Contacted Dr. Denton Brick and her ordered immodium.

## 2020-02-10 NOTE — Progress Notes (Signed)
IV's removed and report called to [place] at [facility] in [place].  Transported by EMS

## 2020-02-10 NOTE — TOC Transition Note (Signed)
Transition of Care [facility]) - CM/SW Discharge Note   Patient Details  Name: ESCARLET SAATHOFF MRN: [id number] Date of Birth: Dec 19, 1923  [facility]) CM/SW Contact:  Shade Flood, LCSW Phone Number: 02/10/2020, 10:58 AM   Clinical Narrative:     TOC following. Pt stable for dc today. Ebony Hail at [facility] states they have insurance auth and they can take pt after lunch. Updated daughter who remains in agreement. Rn to call report. EMS form printed to the floor. Will arrange EMS.  DC clinical sent electronically. There are no other TOC needs for dc.  Final next level of care: Skilled Nursing Facility Barriers to Discharge: Barriers Resolved   Patient Goals and CMS Choice Patient states their goals for this hospitalization and ongoing recovery are:: Get better CMS Medicare.gov Compare Post Acute Care list provided to:: Patient Represenative (must comment) Fortunato Curling) Choice offered to / list presented to : Adult Children  Discharge Placement              Patient chooses bed at: [facility] Patient to be transferred to facility by: EMS Name of family member notified: [name] Patient and family notified of of transfer: 02/10/20  Discharge Plan and Services In-house Referral: Clinical Social Work Discharge Planning Services: NA                                 Social Determinants of Health (SDOH) Interventions     Readmission Risk Interventions Readmission Risk Prevention Plan 02/08/2020 01/07/2020  Transportation Screening Complete Complete  PCP or Specialist Appt within 5-7 Days - Complete  Home Care Screening - Complete  Medication Review (RN CM) - Complete  HRI or Home Care Consult Complete -  Social Work Consult for [place] Planning/Counseling Complete -  Palliative Care Screening Complete -  Medication Review Press photographer) Complete -  Some recent data might be hidden

## 2020-02-10 NOTE — Discharge Instructions (Signed)
1. Needs CMP, cbc 1 week 2. Will need rehab placement for short term rehab pt/ot, slp 3. Recommend 2 view chest x-ray in 3-4 weeks to denote clearance of pneumonia/lung infection 4. Tapering instructions of Amiodarone on file for new onset afib--please follow labs carefully and check tsh in 1 mo and repeat CMP given needs for chronic amiodarone

## 2020-02-11 ENCOUNTER — Other Ambulatory Visit: Payer: Self-pay

## 2020-02-11 ENCOUNTER — Ambulatory Visit (INDEPENDENT_AMBULATORY_CARE_PROVIDER_SITE_OTHER): Payer: Medicare PPO

## 2020-02-11 DIAGNOSIS — G8929 Other chronic pain: Secondary | ICD-10-CM

## 2020-02-11 DIAGNOSIS — B370 Candidal stomatitis: Secondary | ICD-10-CM | POA: Diagnosis not present

## 2020-02-11 DIAGNOSIS — J45909 Unspecified asthma, uncomplicated: Secondary | ICD-10-CM

## 2020-02-11 DIAGNOSIS — Z7951 Long term (current) use of inhaled steroids: Secondary | ICD-10-CM

## 2020-02-11 DIAGNOSIS — I129 Hypertensive chronic kidney disease with stage 1 through stage 4 chronic kidney disease, or unspecified chronic kidney disease: Secondary | ICD-10-CM

## 2020-02-11 DIAGNOSIS — N189 Chronic kidney disease, unspecified: Secondary | ICD-10-CM | POA: Diagnosis not present

## 2020-02-11 DIAGNOSIS — H409 Unspecified glaucoma: Secondary | ICD-10-CM

## 2020-02-11 DIAGNOSIS — N1832 Chronic kidney disease, stage 3b: Secondary | ICD-10-CM

## 2020-02-11 DIAGNOSIS — I10 Essential (primary) hypertension: Secondary | ICD-10-CM | POA: Diagnosis not present

## 2020-02-11 DIAGNOSIS — I639 Cerebral infarction, unspecified: Secondary | ICD-10-CM | POA: Diagnosis not present

## 2020-02-11 DIAGNOSIS — I4891 Unspecified atrial fibrillation: Secondary | ICD-10-CM

## 2020-02-11 DIAGNOSIS — Z7982 Long term (current) use of aspirin: Secondary | ICD-10-CM

## 2020-02-11 DIAGNOSIS — M109 Gout, unspecified: Secondary | ICD-10-CM

## 2020-02-11 DIAGNOSIS — I471 Supraventricular tachycardia: Secondary | ICD-10-CM | POA: Diagnosis not present

## 2020-02-11 DIAGNOSIS — J188 Other pneumonia, unspecified organism: Secondary | ICD-10-CM | POA: Diagnosis not present

## 2020-02-11 DIAGNOSIS — M545 Low back pain, unspecified: Secondary | ICD-10-CM | POA: Diagnosis not present

## 2020-02-11 DIAGNOSIS — K449 Diaphragmatic hernia without obstruction or gangrene: Secondary | ICD-10-CM

## 2020-02-11 DIAGNOSIS — G40909 Epilepsy, unspecified, not intractable, without status epilepticus: Secondary | ICD-10-CM

## 2020-02-11 DIAGNOSIS — Z8673 Personal history of transient ischemic attack (TIA), and cerebral infarction without residual deficits: Secondary | ICD-10-CM

## 2020-02-11 DIAGNOSIS — K219 Gastro-esophageal reflux disease without esophagitis: Secondary | ICD-10-CM

## 2020-02-11 DIAGNOSIS — M1991 Primary osteoarthritis, unspecified site: Secondary | ICD-10-CM

## 2020-02-11 DIAGNOSIS — Z6834 Body mass index (BMI) 34.0-34.9, adult: Secondary | ICD-10-CM

## 2020-02-11 DIAGNOSIS — D649 Anemia, unspecified: Secondary | ICD-10-CM | POA: Diagnosis not present

## 2020-02-11 DIAGNOSIS — L509 Urticaria, unspecified: Secondary | ICD-10-CM

## 2020-02-11 DIAGNOSIS — E785 Hyperlipidemia, unspecified: Secondary | ICD-10-CM

## 2020-02-11 DIAGNOSIS — J189 Pneumonia, unspecified organism: Secondary | ICD-10-CM | POA: Diagnosis not present

## 2020-02-16 DIAGNOSIS — I471 Supraventricular tachycardia: Secondary | ICD-10-CM | POA: Diagnosis not present

## 2020-02-16 DIAGNOSIS — I4891 Unspecified atrial fibrillation: Secondary | ICD-10-CM | POA: Diagnosis not present

## 2020-02-16 DIAGNOSIS — N189 Chronic kidney disease, unspecified: Secondary | ICD-10-CM | POA: Diagnosis not present

## 2020-02-16 DIAGNOSIS — D649 Anemia, unspecified: Secondary | ICD-10-CM | POA: Diagnosis not present

## 2020-02-16 DIAGNOSIS — J189 Pneumonia, unspecified organism: Secondary | ICD-10-CM | POA: Diagnosis not present

## 2020-02-16 DIAGNOSIS — J45909 Unspecified asthma, uncomplicated: Secondary | ICD-10-CM | POA: Diagnosis not present

## 2020-02-16 DIAGNOSIS — G40909 Epilepsy, unspecified, not intractable, without status epilepticus: Secondary | ICD-10-CM | POA: Diagnosis not present

## 2020-02-16 DIAGNOSIS — M109 Gout, unspecified: Secondary | ICD-10-CM | POA: Diagnosis not present

## 2020-02-22 DIAGNOSIS — R000 Tachycardia, unspecified: Secondary | ICD-10-CM | POA: Diagnosis not present

## 2020-02-25 ENCOUNTER — Telehealth: Payer: Self-pay

## 2020-02-25 DIAGNOSIS — H5710 Ocular pain, unspecified eye: Secondary | ICD-10-CM | POA: Diagnosis not present

## 2020-02-25 DIAGNOSIS — R000 Tachycardia, unspecified: Secondary | ICD-10-CM | POA: Diagnosis not present

## 2020-02-25 DIAGNOSIS — G40909 Epilepsy, unspecified, not intractable, without status epilepticus: Secondary | ICD-10-CM | POA: Diagnosis not present

## 2020-02-25 NOTE — Telephone Encounter (Signed)
Pt is not being discharged until 02/27/20, appt already made,

## 2020-02-29 ENCOUNTER — Telehealth: Payer: Self-pay

## 2020-03-01 ENCOUNTER — Ambulatory Visit: Payer: Medicare PPO | Admitting: Cardiology

## 2020-03-02 ENCOUNTER — Encounter: Payer: Self-pay | Admitting: Cardiology

## 2020-03-02 ENCOUNTER — Telehealth: Payer: Self-pay

## 2020-03-02 ENCOUNTER — Ambulatory Visit (INDEPENDENT_AMBULATORY_CARE_PROVIDER_SITE_OTHER): Payer: Medicare PPO | Admitting: Cardiology

## 2020-03-02 ENCOUNTER — Other Ambulatory Visit: Payer: Self-pay

## 2020-03-02 VITALS — BP 130/78 | HR 68 | Ht 64.0 in | Wt 204.0 lb

## 2020-03-02 DIAGNOSIS — N1832 Chronic kidney disease, stage 3b: Secondary | ICD-10-CM

## 2020-03-02 DIAGNOSIS — I10 Essential (primary) hypertension: Secondary | ICD-10-CM | POA: Diagnosis not present

## 2020-03-02 DIAGNOSIS — I4891 Unspecified atrial fibrillation: Secondary | ICD-10-CM

## 2020-03-02 MED ORDER — AMIODARONE HCL 200 MG PO TABS: 200.0000 mg | ORAL_TABLET | Freq: Every day | ORAL | 1 refills | Status: AC

## 2020-03-02 NOTE — Progress Notes (Signed)
Cardiology Office Note:    Date:  03/02/2020   ID:  [name], DOB Jun 30, 1923, MRN [id number]  PCP:  [name], [name]  Cardiologist:  Fransico Him, MD  Electrophysiologist:  None   Referring MD: [name], [name]   No chief complaint on file. Post hospital f/u  History of Present Illness:    [name] is a delightful 86 y.o. female with a history of hypertension, chronic renal insufficiency, and PAF.  She was recently hospitalized in October with pneumonia.  During that hospitalization she had problems with atrial fibrillation and atrial flutter with RVR.  She also had hypotension.  She was ultimately put on amiodarone.  She is seen today for follow-up.  She is not on chronic anticoagulation because of the high risk of falls.  She has been doing well since discharge.  A home aide accompanied her today.  Her heart rate in the office is 60.  She is in normal sinus rhythm on exam.  Past Medical History:  Diagnosis Date  . Chronic low back pain   . Degenerative arthritis   . Eczema   . Edema   . Gout   . Headache   . Hiatal hernia   . Hypertension   . Internal hemorrhoids   . Neurogenic claudication ([place])   . Obesity   . Seizure disorder ([place])   . Seizures ([place])   . Stroke ([place])   . Urticaria     Past Surgical History:  Procedure Laterality Date  . ABDOMINAL HYSTERECTOMY    . APPENDECTOMY    . APPENDECTOMY    . bilateral knees    . cataracts    . CHOLECYSTECTOMY    . mammoplasty reduction    . NEUROPLASTY / TRANSPOSITION MEDIAN NERVE AT CARPAL TUNNEL BILATERAL    . RE-EXCISION OF BREAST LUMPECTOMY    . TUBAL LIGATION      Current Medications: Current Meds  Medication Sig  . acetaminophen (TYLENOL) 500 MG tablet Take 500-1,000 mg by mouth every 8 (eight) hours as needed for mild pain or headache.   . allopurinol (ZYLOPRIM) 100 MG tablet TAKE 1/2 TABLET BY MOUTH EVERY DAY (Patient taking differently: Take 50 mg by mouth daily. )  . aspirin 81 MG  tablet Take 81 mg by mouth daily.  . divalproex (DEPAKOTE) 250 MG DR tablet Take 2 tablets (500 mg total) by mouth 2 (two) times daily.  . famotidine (PEPCID) 40 MG tablet Take 1 tablet (40 mg total) by mouth daily. (Patient taking differently: Take 40 mg by mouth daily as needed for heartburn. )  . Fluticasone Furoate (ARNUITY ELLIPTA) 200 MCG/ACT AEPB INHALE 1 PUFF BY MOUTH EVERY DAY (Patient taking differently: Take 1 puff by mouth daily. )  . levETIRAcetam (KEPPRA XR) 500 MG 24 hr tablet Take 4 tablets (2,000 mg total) by mouth at bedtime.  . montelukast (SINGULAIR) 10 MG tablet TAKE 1 TABLET BY MOUTH EVERYDAY AT BEDTIME (Patient taking differently: Take 10 mg by mouth at bedtime. )  . nystatin (MYCOSTATIN) 100000 UNIT/ML suspension Take 5 mLs (500,000 Units total) by mouth 4 (four) times daily.  Marland Kitchen omeprazole (PRILOSEC) 40 MG capsule Take 1 capsule (40 mg total) by mouth daily. (Patient taking differently: Take 40 mg by mouth daily as needed (for reflux). )  . polyethylene glycol (MIRALAX / GLYCOLAX) 17 g packet Take 17 g by mouth daily as needed for mild constipation.  . [DISCONTINUED] amiodarone (PACERONE) 400 MG tablet Take 1 tablet (  400 mg total) by mouth 2 (two) times daily for 2 days, THEN 0.5 tablets (200 mg total) 2 (two) times daily for 14 days, THEN 0.5 tablets (200 mg total) 2 (two) times daily for 15 days, THEN 0.5 tablets (200 mg total) 2 (two) times daily.     Allergies:   Advicor [niacin-lovastatin er], Caduet [amlodipine-atorvastatin], Codeine, Dilantin [phenytoin], Lescol [fluvastatin], Pamelor [nortriptyline], Penicillins, Sulfa antibiotics, Trileptal [oxcarbazepine], Ultram [tramadol], Welchol [colesevelam], Latex, and Tomato   Social History   Socioeconomic History  . Marital status: Widowed    Spouse name: Not on file  . Number of children: 4  . Years of education: Not on file  . Highest education level: Not on file  Occupational History  . Occupation: Pharmacist, hospital     Comment: retired  Tobacco Use  . Smoking status: Never Smoker  . Smokeless tobacco: Never Used  Vaping Use  . Vaping Use: Never used  Substance and Sexual Activity  . Alcohol use: No  . Drug use: No  . Sexual activity: Not Currently  Other Topics Concern  . Not on file  Social History Narrative   Patient is right handed.   Patient drinks some caffeine daily.   Social Determinants of Health   Financial Resource Strain:   . Difficulty of Paying Living Expenses: Not on file  Food Insecurity:   . Worried About Charity fundraiser in the Last Year: Not on file  . Ran Out of Food in the Last Year: Not on file  Transportation Needs:   . Lack of Transportation (Medical): Not on file  . Lack of Transportation (Non-Medical): Not on file  Physical Activity:   . Days of Exercise per Week: Not on file  . Minutes of Exercise per Session: Not on file  Stress:   . Feeling of Stress : Not on file  Social Connections:   . Frequency of Communication with Friends and Family: Not on file  . Frequency of Social Gatherings with Friends and Family: Not on file  . Attends Religious Services: Not on file  . Active Member of Clubs or Organizations: Not on file  . Attends Archivist Meetings: Not on file  . Marital Status: Not on file     Family History: The patient's family history includes Cancer in her brother, sister, and sister; Diabetes in her mother; Heart disease in her father and mother.  ROS:   Please see the history of present illness.     All other systems reviewed and are negative.  EKGs/Labs/Other Studies Reviewed:    The following studies were reviewed today: Echo 01/10/2020- IMPRESSIONS    1. Left ventricular ejection fraction, by estimation, is 60 to 65%. The  left ventricle has normal function. The left ventricle has no regional  wall motion abnormalities. There is mild concentric left ventricular  hypertrophy. Left ventricular diastolic  parameters are  consistent with Grade I diastolic dysfunction (impaired  relaxation).  2. Right ventricular systolic function is normal. The right ventricular  size is normal. There is normal pulmonary artery systolic pressure.  3. Left atrial size was mildly dilated.  4. The mitral valve is normal in structure. Trivial mitral valve  regurgitation. No evidence of mitral stenosis.  5. The aortic valve is normal in structure. Aortic valve regurgitation is  not visualized. Mild aortic valve sclerosis is present, with no evidence  of aortic valve stenosis.  EKG:  EKG is ordered today.  The ekg ordered 02/04/2020 demonstrates NSR- HR 90  Recent Labs: 01/10/2020: TSH 1.459 02/06/2020: Magnesium 1.7 02/10/2020: ALT 13; BUN 26; Creatinine, Ser 1.00; Hemoglobin 9.3; Platelets 330; Potassium 4.4; Sodium 135  Recent Lipid Panel    Component Value Date/Time   CHOL 90 01/05/2020 0629   CHOL 172 08/04/2015 1124   TRIG 73 01/05/2020 0629   TRIG 215 (H) 01/11/2014 0903   HDL 26 (L) 01/05/2020 0629   HDL 38 (L) 08/04/2015 1124   HDL 42 01/11/2014 0903   CHOLHDL 3.5 01/05/2020 0629   VLDL 15 01/05/2020 0629   LDLCALC 49 01/05/2020 0629   LDLCALC 83 08/04/2015 1124   LDLCALC 95 01/11/2014 0903    Physical Exam:    VS:  BP 130/78   Pulse 68   Ht 5\' 4"  (1.626 m)   Wt 204 lb (92.5 kg)   SpO2 97%   BMI 35.02 kg/m     Wt Readings from Last 3 Encounters:  03/02/20 204 lb (92.5 kg)  02/08/20 208 lb 8.9 oz (94.6 kg)  01/11/20 215 lb 13.3 oz (97.9 kg)     GEN: Elderly AA female in no acute distress HEENT: Normal NECK: No JVD; No carotid bruits CARDIAC: RRR, no murmurs, rubs, gallops RESPIRATORY:  Clear to auscultation without rales, wheezing or rhonchi  ABDOMEN: Soft, non-tender, non-distended MUSCULOSKELETAL:  No edema; No deformity  SKIN: Warm and dry NEUROLOGIC:  Alert and oriented x 3 PSYCHIATRIC:  Normal affect   ASSESSMENT:    Atrial fibrillation with RVR (HCC) Amiodarone added Oct 2021-  pt is in NSR today- decrease dose to 200 mg daily. No AC secondary to fall risk  Essential hypertension, benign Controlled-  CKD (chronic kidney disease) stage 3, GFR 30-59 ml/min Stable  PLAN:    Decrease Amiodarone to 200 mg daily, f/u 3 months   Medication Adjustments/Labs and Tests Ordered: Current medicines are reviewed at length with the patient today.  Concerns regarding medicines are outlined above.  No orders of the defined types were placed in this encounter.  Meds ordered this encounter  Medications  . amiodarone (PACERONE) 200 MG tablet    Sig: Take 1 tablet (200 mg total) by mouth daily.    Dispense:  90 tablet    Refill:  1    Dose decreased 03/02/2020    Patient Instructions  Medication Instructions:   Decrease Amiodarone to 200mg  daily.   Continue all other medications.    Labwork: none  Testing/Procedures: none  Follow-Up: 3 months   Any Other Special Instructions Will Be Listed Below (If Applicable).  If you need a refill on your cardiac medications before your next appointment, please call your pharmacy.     Signed, Kerin Ransom, PA-C  03/02/2020 11:39 AM    [facility]

## 2020-03-02 NOTE — Assessment & Plan Note (Signed)
Stable

## 2020-03-02 NOTE — Telephone Encounter (Signed)
Contact Date: 03/02/2020 Contacted By: Felicity Coyer, LPN  Transition Care Management Follow-up Telephone Call  Date of discharge and from where: 02/10/2020, [facility]  Discharge Diagnosis:  PNA (pneumonia)  How have you been since you were released from the hospital? Patient states she is feeling better.  Any questions or concerns? No   Items Reviewed:  Did the pt receive and understand the discharge instructions provided? Yes   Medications obtained and verified? Yes   Any new allergies since your discharge? No   Dietary orders reviewed? Yes  Do you have support at home? Yes   Discontinued Medications Zyrtec, Diltiazem, Metalazone, Metoprolol, Mometasone, Nystatin, multi-vitamin, Triamcinolone  New Medications Added Amiodarone  Current Medication List Allergies as of 03/02/2020      Reactions   Advicor [niacin-lovastatin Er] Other (See Comments)   Reaction unknown to daughter   Caduet [amlodipine-atorvastatin] Other (See Comments)   Reaction unknown to daughter   Codeine Other (See Comments)   Reaction unknown to daughter   Dilantin [phenytoin] Other (See Comments)   Reaction unknown to daughter   Lescol [fluvastatin] Other (See Comments)   Reaction unknown to daughter   Pamelor [nortriptyline] Other (See Comments)   Reaction unknown to daughter   Penicillins Hives   Did it involve swelling of the face/tongue/throat, SOB, or low BP? Unk Did it involve sudden or severe rash/hives, skin peeling, or any reaction on the inside of your mouth or nose? Unk Did you need to seek medical attention at a hospital or doctor's office? Unk When did it last happen? "it was a long time ago" If all above answers are "NO", may proceed with cephalosporin use.   Sulfa Antibiotics Hives   Trileptal [oxcarbazepine] Other (See Comments)   Reaction unknown to daughter   Ultram [tramadol] Other (See Comments)   Reaction unknown to daughter   Welchol [colesevelam] Other (See  Comments)   Reaction unknown to daughter   Latex Rash   Tomato Rash      Medication List       Accurate as of March 02, 2020  2:16 PM. If you have any questions, ask your nurse or doctor.        acetaminophen 500 MG tablet Commonly known as: TYLENOL Take 500-1,000 mg by mouth every 8 (eight) hours as needed for mild pain or headache.   allopurinol 100 MG tablet Commonly known as: ZYLOPRIM TAKE 1/2 TABLET BY MOUTH EVERY DAY   amiodarone 200 MG tablet Commonly known as: PACERONE Take 1 tablet (200 mg total) by mouth daily. What changed:   medication strength  See the new instructions. Changed by: Kerin Ransom, PA-C   Arnuity Ellipta 200 MCG/ACT Aepb Generic drug: Fluticasone Furoate INHALE 1 PUFF BY MOUTH EVERY DAY What changed:   how much to take  how to take this  when to take this  additional instructions   aspirin 81 MG tablet Take 81 mg by mouth daily.   divalproex 250 MG DR tablet Commonly known as: DEPAKOTE Take 2 tablets (500 mg total) by mouth 2 (two) times daily.   famotidine 40 MG tablet Commonly known as: PEPCID Take 1 tablet (40 mg total) by mouth daily. What changed:   when to take this  reasons to take this   levETIRAcetam 500 MG 24 hr tablet Commonly known as: KEPPRA XR Take 4 tablets (2,000 mg total) by mouth at bedtime.   montelukast 10 MG tablet Commonly known as: SINGULAIR TAKE 1 TABLET BY MOUTH EVERYDAY AT BEDTIME  What changed:   how much to take  how to take this  when to take this  additional instructions   nystatin 100000 UNIT/ML suspension Commonly known as: MYCOSTATIN Take 5 mLs (500,000 Units total) by mouth 4 (four) times daily.   omeprazole 40 MG capsule Commonly known as: PriLOSEC Take 1 capsule (40 mg total) by mouth daily. What changed:   when to take this  reasons to take this   polyethylene glycol 17 g packet Commonly known as: MIRALAX / GLYCOLAX Take 17 g by mouth daily as needed for mild  constipation.        Home Care and Equipment/Supplies: Were home health services ordered? yes If so, what is the name of the agency? [facility]  Has the agency set up a time to come to the patient's home? Yes, came today, 03/02/2020 Were any new equipment or medical supplies ordered?  N/A What is the name of the medical supply agency? N/A Were you able to get the supplies/equipment? N/A Do you have any questions related to the use of the equipment or supplies? N/A  Functional Questionnaire: (I = Independent and D = Dependent) ADLs: D  Bathing/Dressing- D  Meal Prep- D  Eating- I  Maintaining continence- I  Transferring/Ambulation- D  Managing Meds- D  Follow up appointments reviewed:   PCP Hospital f/u appt confirmed? Yes  Scheduled to see [name] on 03/08/2020 at 11:10 am  [facility] f/u appt confirmed? Yes  Scheduled to see Dr. Rosalyn Gess 03/02/2020.  Are transportation arrangements needed? No   If their condition worsens, is the pt aware to call PCP or go to the Emergency Dept.? Yes  Was the patient provided with contact information for the PCP's office or ED? Yes  Was to pt encouraged to call back with questions or concerns? Yes

## 2020-03-02 NOTE — Patient Instructions (Addendum)
Medication Instructions:   Decrease Amiodarone to 200mg  daily.   Continue all other medications.    Labwork: none  Testing/Procedures: none  Follow-Up: 3 months   Any Other Special Instructions Will Be Listed Below (If Applicable).  If you need a refill on your cardiac medications before your next appointment, please call your pharmacy.

## 2020-03-02 NOTE — Assessment & Plan Note (Addendum)
Amiodarone added Oct 2021- pt is in NSR today- decrease dose to 200 mg daily. No AC secondary to fall risk

## 2020-03-02 NOTE — Assessment & Plan Note (Signed)
Controlled.  

## 2020-03-04 ENCOUNTER — Telehealth: Payer: Self-pay | Admitting: Family

## 2020-03-04 DIAGNOSIS — J189 Pneumonia, unspecified organism: Secondary | ICD-10-CM | POA: Diagnosis not present

## 2020-03-04 DIAGNOSIS — J45909 Unspecified asthma, uncomplicated: Secondary | ICD-10-CM | POA: Diagnosis not present

## 2020-03-04 DIAGNOSIS — G40909 Epilepsy, unspecified, not intractable, without status epilepticus: Secondary | ICD-10-CM | POA: Diagnosis not present

## 2020-03-04 DIAGNOSIS — I4891 Unspecified atrial fibrillation: Secondary | ICD-10-CM | POA: Diagnosis not present

## 2020-03-04 DIAGNOSIS — I471 Supraventricular tachycardia: Secondary | ICD-10-CM | POA: Diagnosis not present

## 2020-03-04 DIAGNOSIS — I129 Hypertensive chronic kidney disease with stage 1 through stage 4 chronic kidney disease, or unspecified chronic kidney disease: Secondary | ICD-10-CM | POA: Diagnosis not present

## 2020-03-04 DIAGNOSIS — G3184 Mild cognitive impairment, so stated: Secondary | ICD-10-CM | POA: Diagnosis not present

## 2020-03-04 DIAGNOSIS — D631 Anemia in chronic kidney disease: Secondary | ICD-10-CM | POA: Diagnosis not present

## 2020-03-04 DIAGNOSIS — N184 Chronic kidney disease, stage 4 (severe): Secondary | ICD-10-CM | POA: Diagnosis not present

## 2020-03-07 DIAGNOSIS — I4891 Unspecified atrial fibrillation: Secondary | ICD-10-CM | POA: Diagnosis not present

## 2020-03-07 DIAGNOSIS — N184 Chronic kidney disease, stage 4 (severe): Secondary | ICD-10-CM | POA: Diagnosis not present

## 2020-03-07 DIAGNOSIS — G40909 Epilepsy, unspecified, not intractable, without status epilepticus: Secondary | ICD-10-CM | POA: Diagnosis not present

## 2020-03-07 DIAGNOSIS — J189 Pneumonia, unspecified organism: Secondary | ICD-10-CM | POA: Diagnosis not present

## 2020-03-07 DIAGNOSIS — G3184 Mild cognitive impairment, so stated: Secondary | ICD-10-CM | POA: Diagnosis not present

## 2020-03-07 DIAGNOSIS — I129 Hypertensive chronic kidney disease with stage 1 through stage 4 chronic kidney disease, or unspecified chronic kidney disease: Secondary | ICD-10-CM | POA: Diagnosis not present

## 2020-03-07 DIAGNOSIS — J45909 Unspecified asthma, uncomplicated: Secondary | ICD-10-CM | POA: Diagnosis not present

## 2020-03-07 DIAGNOSIS — D631 Anemia in chronic kidney disease: Secondary | ICD-10-CM | POA: Diagnosis not present

## 2020-03-07 DIAGNOSIS — I471 Supraventricular tachycardia: Secondary | ICD-10-CM | POA: Diagnosis not present

## 2020-03-07 MED ORDER — DIVALPROEX SODIUM 250 MG PO DR TAB: 500.0000 mg | DELAYED_RELEASE_TABLET | Freq: Two times a day (BID) | ORAL | 2 refills | Status: DC

## 2020-03-07 NOTE — Telephone Encounter (Signed)
Prescription sent to pharmacy.

## 2020-03-07 NOTE — Addendum Note (Signed)
Addended by: [name] A on: 03/07/2020 01:17 PM   Modules accepted: Orders

## 2020-03-08 ENCOUNTER — Ambulatory Visit (INDEPENDENT_AMBULATORY_CARE_PROVIDER_SITE_OTHER): Payer: Medicare PPO

## 2020-03-08 ENCOUNTER — Encounter: Payer: Self-pay | Admitting: Family

## 2020-03-08 ENCOUNTER — Other Ambulatory Visit: Payer: Self-pay

## 2020-03-08 ENCOUNTER — Ambulatory Visit (INDEPENDENT_AMBULATORY_CARE_PROVIDER_SITE_OTHER): Payer: Medicare PPO | Admitting: Family

## 2020-03-08 VITALS — BP 115/68 | HR 93 | Temp 96.9°F | Ht 64.0 in | Wt 203.0 lb

## 2020-03-08 DIAGNOSIS — R059 Cough, unspecified: Secondary | ICD-10-CM

## 2020-03-08 DIAGNOSIS — B370 Candidal stomatitis: Secondary | ICD-10-CM

## 2020-03-08 DIAGNOSIS — Z09 Encounter for follow-up examination after completed treatment for conditions other than malignant neoplasm: Secondary | ICD-10-CM

## 2020-03-08 MED ORDER — NYSTATIN 100000 UNIT/ML MT SUSP: 5.0000 mL | Freq: Four times a day (QID) | OROMUCOSAL | 1 refills | Status: AC

## 2020-03-08 NOTE — Progress Notes (Signed)
Subjective:    Patient ID: [name], female    DOB: 11/03/1923, 86 y.o.   MRN: [id number]  Chief Complaint  Patient presents with  . Transitions Of Care    HPI Today's visit was for Transitional Care Management.  The patient was discharged from [facility] on 02/10/20 with a primary diagnosis of PNA. She was discharged to the [facility] 02/27/20.   Contact with the patient and/or caregiver, by a clinical staff member, was made on 03/02/20 and was documented as a telephone encounter within the EMR.  Through chart review and discussion with the patient I have determined that management of their condition is of moderate complexity.    She has multiple admissions to the ED with pneumonia and weakness. She is currently has home health and PT working with once a week.   Pt states she is weak and can not walk any distance. Denies any fever, but has intermittent cough with thick sputum and SOB at times.   She is complaining of oral thrush on her gum and has been using nystatin mouth wash that helps.   Review of Systems  Respiratory: Positive for cough and shortness of breath.   Neurological: Positive for weakness.  All other systems reviewed and are negative.      Objective:   Physical Exam Vitals reviewed.  Constitutional:      General: She is not in acute distress.    Appearance: She is well-developed.  HENT:     Head: Normocephalic and atraumatic.     Right Ear: Tympanic membrane normal.     Left Ear: Tympanic membrane normal.  Eyes:     Pupils: Pupils are equal, round, and reactive to light.  Neck:     Thyroid: No thyromegaly.  Cardiovascular:     Rate and Rhythm: Normal rate and regular rhythm.     Heart sounds: Normal heart sounds. No murmur heard.   Pulmonary:     Effort: Pulmonary effort is normal. No respiratory distress.     Breath sounds: Decreased breath sounds present. No wheezing.  Abdominal:     General: Bowel sounds are normal. There is no  distension.     Palpations: Abdomen is soft.     Tenderness: There is no abdominal tenderness.  Musculoskeletal:        General: No tenderness. Normal range of motion.     Cervical back: Normal range of motion and neck supple.  Skin:    General: Skin is warm and dry.  Neurological:     Mental Status: She is alert and oriented to person, place, and time.     Cranial Nerves: No cranial nerve deficit.     Motor: Weakness present.     Gait: Gait abnormal.     Deep Tendon Reflexes: Reflexes are normal and symmetric.     Comments: Pt in wheelchair  Psychiatric:        Behavior: Behavior normal.        Thought Content: Thought content normal.        Judgment: Judgment normal.       BP 115/68   Pulse 93   Temp (!) 96.9 F (36.1 C) (Temporal)   Ht '5\' 4"'  (1.626 m)   Wt 203 lb (92.1 kg)   SpO2 95%   BMI 34.84 kg/m      Assessment & Plan:  [name] comes in today with chief complaint of Transitions Of Care   Diagnosis and orders addressed:  1. Hospital discharge follow-up - CBC with Differential/Platelet - CMP14+EGFR  2. Cough - CBC with Differential/Platelet - CMP14+EGFR - DG Chest 2 View; Future  3. Oral thrush - nystatin (MYCOSTATIN) 100000 UNIT/ML suspension; Take 5 mLs (500,000 Units total) by mouth 4 (four) times daily.  Dispense: 60 mL; Refill: 1 - CBC with Differential/Platelet - CMP14+EGFR   Labs pending Health Maintenance reviewed Diet and exercise encouraged  Follow up plan: 3 months   [name], [name]

## 2020-03-08 NOTE — Patient Instructions (Signed)
Community-Acquired Pneumonia, Adult Pneumonia is a type of lung infection that causes swelling in the airways of the lungs. Mucus and fluid may also build up inside the airways. This may cause coughing and difficulty breathing. There are different types of pneumonia. One type can develop while a person is in a hospital. A different type is called community-acquired pneumonia. It develops in people who are not, and have not recently been, in the hospital or another type of health care facility. What are the causes? This condition may be caused by:  Viruses. This is the most common cause of pneumonia.  Bacteria. Community-acquired pneumonia is often caused by Streptococcus pneumoniae bacteria. These bacteria are often passed from one person to another by breathing in droplets from the cough or sneeze of an infected person.  Fungi. This is the least common cause of pneumonia. What increases the risk? The following factors may make you more likely to develop this condition:  Having a chronic disease, such as chronic obstructive pulmonary disease (COPD), asthma, congestive heart failure, cystic fibrosis, diabetes, or kidney disease.  Having early-stage or late-stage HIV.  Having sickle cell disease.  Having had your spleen removed (splenectomy).  Having poor dental hygiene.  Having a medical condition that increases the risk of breathing in (aspirating) secretions from your own mouth and nose.  Having a weakened body defense system (immune system).  Being a smoker.  Traveling to areas where pneumonia-causing germs commonly exist.  Being around animal habitats or animals that have pneumonia-causing germs, including birds, bats, rabbits, cats, and farm animals. What are the signs or symptoms? Symptoms of this condition include:  A dry cough.  A wet (productive) cough.  Fever.  Sweating.  Chest pain, especially when breathing deeply or coughing.  Rapid breathing or difficulty  breathing.  Shortness of breath.  Shaking chills.  Fatigue.  Muscle aches. How is this diagnosed? This condition may be diagnosed based on:  Your medical history.  A physical exam. You may also have tests, including:  Chest X-rays.  Tests of your blood oxygen level and other blood gases.  Tests on blood, mucus (sputum), fluid around your lungs (pleural fluid), and urine. If your pneumonia is severe, other tests may be done to find the exact cause of your illness. How is this treated? Treatment for this condition depends on many factors, such as the cause of your pneumonia, the medicines you take, and other medical conditions that you have. For most adults, treatment and recovery from pneumonia may occur at home. In some cases, treatment must happen in a hospital. Treatment may include:  Medicines that are given by mouth or through an IV, including: ? Antibiotic medicines, if the pneumonia was caused by bacteria. ? Antiviral medicines, if the pneumonia was caused by a virus.  Being given extra oxygen.  Respiratory therapy. Although rare, treating severe pneumonia may include:  Using a machine to help you breathe (mechanical ventilation). This is done if you are not breathing well on your own and you cannot maintain a safe blood oxygen level.  Thoracentesis. This is a procedure to remove fluid from around one lung or both lungs to help you breathe better. Follow these instructions at home:  Medicines  Take over-the-counter and prescription medicines only as told by your health care provider. ? Only take cough medicine if you are losing sleep. Be aware that cough medicine can prevent your body's natural ability to remove mucus from your lungs.  If you were prescribed an antibiotic   medicine, take it as told by your health care provider. Do not stop taking the antibiotic even if you start to feel better. General instructions  Sleep in a semi-upright position at night. Try  sleeping in a reclining chair, or place a few pillows under your head.  Rest as needed and get at least 8 hours of sleep each night.  Drink enough water to keep your urine pale yellow. This will help to thin out mucus secretions in your lungs.  Eat a healthy diet that includes plenty of vegetables, fruits, whole grains, low-fat dairy products, and lean protein.  Do not use any products that contain nicotine or tobacco, such as cigarettes, e-cigarettes, and chewing tobacco. If you need help quitting, ask your health care provider.  Keep all follow-up visits as told by your health care provider. This is important. How is this prevented? You can lower your risk of developing community-acquired pneumonia by:  Getting a pneumococcal vaccine. There are different types and schedules of pneumococcal vaccines. Ask your health care provider which option is best for you. Consider getting the vaccine if: ? You are older than 84 years of age. ? You are older than 84 years of age and are undergoing cancer treatment, have chronic lung disease, or have other medical conditions that affect your immune system. Ask your health care provider if this applies to you.  Getting an influenza vaccine every year. Ask your health care provider which type of vaccine is best for you.  Getting regular checkups from your dentist.  Washing your hands often. If soap and water are not available, use hand sanitizer. Contact a health care provider if:  You have a fever.  You are losing sleep because you cannot control your cough with cough medicine. Get help right away if:  You have worsening shortness of breath.  You have increased chest pain.  Your sickness becomes worse, especially if you are an older adult or have a weakened immune system.  You cough up blood. Summary  Pneumonia is an infection of the lungs.  Community-acquired pneumonia develops in people who have not been in the hospital. It can be caused  by bacteria, viruses, or fungi.  This condition may be treated with antibiotics or antiviral medicines.  Severe cases may require hospitalization, mechanical ventilation, and other procedures to drain fluid from the lungs. This information is not intended to replace advice given to you by your health care provider. Make sure you discuss any questions you have with your health care provider. Document Revised: 11/28/2017 Document Reviewed: 11/28/2017 Elsevier Patient Education  2020 [employer]vier Inc[employer]  

## 2020-03-14 ENCOUNTER — Ambulatory Visit: Payer: Medicare PPO | Admitting: Physician Assistant

## 2020-03-15 ENCOUNTER — Other Ambulatory Visit: Payer: Self-pay | Admitting: Family

## 2020-03-15 DIAGNOSIS — D631 Anemia in chronic kidney disease: Secondary | ICD-10-CM | POA: Diagnosis not present

## 2020-03-15 DIAGNOSIS — J45909 Unspecified asthma, uncomplicated: Secondary | ICD-10-CM | POA: Diagnosis not present

## 2020-03-15 DIAGNOSIS — I4891 Unspecified atrial fibrillation: Secondary | ICD-10-CM | POA: Diagnosis not present

## 2020-03-15 DIAGNOSIS — G3184 Mild cognitive impairment, so stated: Secondary | ICD-10-CM | POA: Diagnosis not present

## 2020-03-15 DIAGNOSIS — G40909 Epilepsy, unspecified, not intractable, without status epilepticus: Secondary | ICD-10-CM | POA: Diagnosis not present

## 2020-03-15 DIAGNOSIS — J189 Pneumonia, unspecified organism: Secondary | ICD-10-CM | POA: Diagnosis not present

## 2020-03-15 DIAGNOSIS — N184 Chronic kidney disease, stage 4 (severe): Secondary | ICD-10-CM | POA: Diagnosis not present

## 2020-03-15 DIAGNOSIS — I129 Hypertensive chronic kidney disease with stage 1 through stage 4 chronic kidney disease, or unspecified chronic kidney disease: Secondary | ICD-10-CM | POA: Diagnosis not present

## 2020-03-15 DIAGNOSIS — I471 Supraventricular tachycardia: Secondary | ICD-10-CM | POA: Diagnosis not present

## 2020-03-16 DIAGNOSIS — I471 Supraventricular tachycardia: Secondary | ICD-10-CM | POA: Diagnosis not present

## 2020-03-16 DIAGNOSIS — G3184 Mild cognitive impairment, so stated: Secondary | ICD-10-CM | POA: Diagnosis not present

## 2020-03-16 DIAGNOSIS — J45909 Unspecified asthma, uncomplicated: Secondary | ICD-10-CM | POA: Diagnosis not present

## 2020-03-16 DIAGNOSIS — J189 Pneumonia, unspecified organism: Secondary | ICD-10-CM | POA: Diagnosis not present

## 2020-03-16 DIAGNOSIS — G40909 Epilepsy, unspecified, not intractable, without status epilepticus: Secondary | ICD-10-CM | POA: Diagnosis not present

## 2020-03-16 DIAGNOSIS — I4891 Unspecified atrial fibrillation: Secondary | ICD-10-CM | POA: Diagnosis not present

## 2020-03-16 DIAGNOSIS — I129 Hypertensive chronic kidney disease with stage 1 through stage 4 chronic kidney disease, or unspecified chronic kidney disease: Secondary | ICD-10-CM | POA: Diagnosis not present

## 2020-03-16 DIAGNOSIS — D631 Anemia in chronic kidney disease: Secondary | ICD-10-CM | POA: Diagnosis not present

## 2020-03-16 DIAGNOSIS — N184 Chronic kidney disease, stage 4 (severe): Secondary | ICD-10-CM | POA: Diagnosis not present

## 2020-03-18 DIAGNOSIS — I4891 Unspecified atrial fibrillation: Secondary | ICD-10-CM | POA: Diagnosis not present

## 2020-03-18 DIAGNOSIS — I471 Supraventricular tachycardia: Secondary | ICD-10-CM | POA: Diagnosis not present

## 2020-03-18 DIAGNOSIS — J189 Pneumonia, unspecified organism: Secondary | ICD-10-CM | POA: Diagnosis not present

## 2020-03-18 DIAGNOSIS — G3184 Mild cognitive impairment, so stated: Secondary | ICD-10-CM | POA: Diagnosis not present

## 2020-03-18 DIAGNOSIS — J45909 Unspecified asthma, uncomplicated: Secondary | ICD-10-CM | POA: Diagnosis not present

## 2020-03-18 DIAGNOSIS — D631 Anemia in chronic kidney disease: Secondary | ICD-10-CM | POA: Diagnosis not present

## 2020-03-18 DIAGNOSIS — N184 Chronic kidney disease, stage 4 (severe): Secondary | ICD-10-CM | POA: Diagnosis not present

## 2020-03-18 DIAGNOSIS — G40909 Epilepsy, unspecified, not intractable, without status epilepticus: Secondary | ICD-10-CM | POA: Diagnosis not present

## 2020-03-18 DIAGNOSIS — I129 Hypertensive chronic kidney disease with stage 1 through stage 4 chronic kidney disease, or unspecified chronic kidney disease: Secondary | ICD-10-CM | POA: Diagnosis not present

## 2020-03-22 DIAGNOSIS — I4891 Unspecified atrial fibrillation: Secondary | ICD-10-CM | POA: Diagnosis not present

## 2020-03-22 DIAGNOSIS — J189 Pneumonia, unspecified organism: Secondary | ICD-10-CM | POA: Diagnosis not present

## 2020-03-22 DIAGNOSIS — J45909 Unspecified asthma, uncomplicated: Secondary | ICD-10-CM | POA: Diagnosis not present

## 2020-03-22 DIAGNOSIS — G40909 Epilepsy, unspecified, not intractable, without status epilepticus: Secondary | ICD-10-CM | POA: Diagnosis not present

## 2020-03-22 DIAGNOSIS — D631 Anemia in chronic kidney disease: Secondary | ICD-10-CM | POA: Diagnosis not present

## 2020-03-22 DIAGNOSIS — I129 Hypertensive chronic kidney disease with stage 1 through stage 4 chronic kidney disease, or unspecified chronic kidney disease: Secondary | ICD-10-CM | POA: Diagnosis not present

## 2020-03-22 DIAGNOSIS — G3184 Mild cognitive impairment, so stated: Secondary | ICD-10-CM | POA: Diagnosis not present

## 2020-03-22 DIAGNOSIS — I471 Supraventricular tachycardia: Secondary | ICD-10-CM | POA: Diagnosis not present

## 2020-03-22 DIAGNOSIS — N184 Chronic kidney disease, stage 4 (severe): Secondary | ICD-10-CM | POA: Diagnosis not present

## 2020-03-24 DIAGNOSIS — D631 Anemia in chronic kidney disease: Secondary | ICD-10-CM | POA: Diagnosis not present

## 2020-03-24 DIAGNOSIS — J45909 Unspecified asthma, uncomplicated: Secondary | ICD-10-CM | POA: Diagnosis not present

## 2020-03-24 DIAGNOSIS — I4891 Unspecified atrial fibrillation: Secondary | ICD-10-CM | POA: Diagnosis not present

## 2020-03-24 DIAGNOSIS — G3184 Mild cognitive impairment, so stated: Secondary | ICD-10-CM | POA: Diagnosis not present

## 2020-03-24 DIAGNOSIS — G40909 Epilepsy, unspecified, not intractable, without status epilepticus: Secondary | ICD-10-CM | POA: Diagnosis not present

## 2020-03-24 DIAGNOSIS — J189 Pneumonia, unspecified organism: Secondary | ICD-10-CM | POA: Diagnosis not present

## 2020-03-24 DIAGNOSIS — N184 Chronic kidney disease, stage 4 (severe): Secondary | ICD-10-CM | POA: Diagnosis not present

## 2020-03-24 DIAGNOSIS — I471 Supraventricular tachycardia: Secondary | ICD-10-CM | POA: Diagnosis not present

## 2020-03-24 DIAGNOSIS — I129 Hypertensive chronic kidney disease with stage 1 through stage 4 chronic kidney disease, or unspecified chronic kidney disease: Secondary | ICD-10-CM | POA: Diagnosis not present

## 2020-03-25 DIAGNOSIS — D631 Anemia in chronic kidney disease: Secondary | ICD-10-CM | POA: Diagnosis not present

## 2020-03-25 DIAGNOSIS — G3184 Mild cognitive impairment, so stated: Secondary | ICD-10-CM | POA: Diagnosis not present

## 2020-03-25 DIAGNOSIS — I129 Hypertensive chronic kidney disease with stage 1 through stage 4 chronic kidney disease, or unspecified chronic kidney disease: Secondary | ICD-10-CM | POA: Diagnosis not present

## 2020-03-25 DIAGNOSIS — I4891 Unspecified atrial fibrillation: Secondary | ICD-10-CM | POA: Diagnosis not present

## 2020-03-25 DIAGNOSIS — J45909 Unspecified asthma, uncomplicated: Secondary | ICD-10-CM | POA: Diagnosis not present

## 2020-03-25 DIAGNOSIS — J189 Pneumonia, unspecified organism: Secondary | ICD-10-CM | POA: Diagnosis not present

## 2020-03-25 DIAGNOSIS — N184 Chronic kidney disease, stage 4 (severe): Secondary | ICD-10-CM | POA: Diagnosis not present

## 2020-03-25 DIAGNOSIS — G40909 Epilepsy, unspecified, not intractable, without status epilepticus: Secondary | ICD-10-CM | POA: Diagnosis not present

## 2020-03-25 DIAGNOSIS — I471 Supraventricular tachycardia: Secondary | ICD-10-CM | POA: Diagnosis not present

## 2020-03-29 DIAGNOSIS — J45909 Unspecified asthma, uncomplicated: Secondary | ICD-10-CM | POA: Diagnosis not present

## 2020-03-29 DIAGNOSIS — D631 Anemia in chronic kidney disease: Secondary | ICD-10-CM | POA: Diagnosis not present

## 2020-03-29 DIAGNOSIS — I471 Supraventricular tachycardia: Secondary | ICD-10-CM | POA: Diagnosis not present

## 2020-03-29 DIAGNOSIS — I4891 Unspecified atrial fibrillation: Secondary | ICD-10-CM | POA: Diagnosis not present

## 2020-03-29 DIAGNOSIS — G40909 Epilepsy, unspecified, not intractable, without status epilepticus: Secondary | ICD-10-CM | POA: Diagnosis not present

## 2020-03-29 DIAGNOSIS — N184 Chronic kidney disease, stage 4 (severe): Secondary | ICD-10-CM | POA: Diagnosis not present

## 2020-03-29 DIAGNOSIS — J189 Pneumonia, unspecified organism: Secondary | ICD-10-CM | POA: Diagnosis not present

## 2020-03-29 DIAGNOSIS — I129 Hypertensive chronic kidney disease with stage 1 through stage 4 chronic kidney disease, or unspecified chronic kidney disease: Secondary | ICD-10-CM | POA: Diagnosis not present

## 2020-03-29 DIAGNOSIS — G3184 Mild cognitive impairment, so stated: Secondary | ICD-10-CM | POA: Diagnosis not present

## 2020-03-30 DIAGNOSIS — G40909 Epilepsy, unspecified, not intractable, without status epilepticus: Secondary | ICD-10-CM | POA: Diagnosis not present

## 2020-03-30 DIAGNOSIS — J45909 Unspecified asthma, uncomplicated: Secondary | ICD-10-CM | POA: Diagnosis not present

## 2020-03-30 DIAGNOSIS — J189 Pneumonia, unspecified organism: Secondary | ICD-10-CM | POA: Diagnosis not present

## 2020-03-30 DIAGNOSIS — I4891 Unspecified atrial fibrillation: Secondary | ICD-10-CM | POA: Diagnosis not present

## 2020-03-30 DIAGNOSIS — I129 Hypertensive chronic kidney disease with stage 1 through stage 4 chronic kidney disease, or unspecified chronic kidney disease: Secondary | ICD-10-CM | POA: Diagnosis not present

## 2020-03-30 DIAGNOSIS — N184 Chronic kidney disease, stage 4 (severe): Secondary | ICD-10-CM | POA: Diagnosis not present

## 2020-03-30 DIAGNOSIS — G3184 Mild cognitive impairment, so stated: Secondary | ICD-10-CM | POA: Diagnosis not present

## 2020-03-30 DIAGNOSIS — I471 Supraventricular tachycardia: Secondary | ICD-10-CM | POA: Diagnosis not present

## 2020-03-30 DIAGNOSIS — D631 Anemia in chronic kidney disease: Secondary | ICD-10-CM | POA: Diagnosis not present

## 2020-04-01 ENCOUNTER — Telehealth: Payer: Self-pay | Admitting: *Deleted

## 2020-04-01 DIAGNOSIS — J45909 Unspecified asthma, uncomplicated: Secondary | ICD-10-CM | POA: Diagnosis not present

## 2020-04-01 DIAGNOSIS — D631 Anemia in chronic kidney disease: Secondary | ICD-10-CM | POA: Diagnosis not present

## 2020-04-01 DIAGNOSIS — I129 Hypertensive chronic kidney disease with stage 1 through stage 4 chronic kidney disease, or unspecified chronic kidney disease: Secondary | ICD-10-CM | POA: Diagnosis not present

## 2020-04-01 DIAGNOSIS — G3184 Mild cognitive impairment, so stated: Secondary | ICD-10-CM | POA: Diagnosis not present

## 2020-04-01 DIAGNOSIS — J189 Pneumonia, unspecified organism: Secondary | ICD-10-CM | POA: Diagnosis not present

## 2020-04-01 DIAGNOSIS — I4891 Unspecified atrial fibrillation: Secondary | ICD-10-CM | POA: Diagnosis not present

## 2020-04-01 DIAGNOSIS — I471 Supraventricular tachycardia: Secondary | ICD-10-CM | POA: Diagnosis not present

## 2020-04-01 DIAGNOSIS — N184 Chronic kidney disease, stage 4 (severe): Secondary | ICD-10-CM | POA: Diagnosis not present

## 2020-04-01 DIAGNOSIS — G40909 Epilepsy, unspecified, not intractable, without status epilepticus: Secondary | ICD-10-CM | POA: Diagnosis not present

## 2020-04-01 NOTE — Telephone Encounter (Signed)
Received call from Mark OT w/ [facility].  Mark states that patient has had some cognitive changes the last 2 visits this week and has had damp pads and under garments.  Mark would like for patients daughter to come pick up urine specimen kit to check patient for UTI.  Call Cynthia Haywood at [phone] or OT [phone] 

## 2020-04-01 NOTE — Telephone Encounter (Signed)
[name] aware and verbalizes understanding.

## 2020-04-01 NOTE — Telephone Encounter (Signed)
Ok to bring urine.

## 2020-04-04 ENCOUNTER — Other Ambulatory Visit: Payer: Self-pay | Admitting: Family

## 2020-04-04 ENCOUNTER — Other Ambulatory Visit: Payer: Self-pay | Admitting: *Deleted

## 2020-04-04 DIAGNOSIS — R4689 Other symptoms and signs involving appearance and behavior: Secondary | ICD-10-CM | POA: Diagnosis not present

## 2020-04-04 DIAGNOSIS — R8290 Unspecified abnormal findings in urine: Secondary | ICD-10-CM

## 2020-04-04 DIAGNOSIS — R4189 Other symptoms and signs involving cognitive functions and awareness: Secondary | ICD-10-CM

## 2020-04-04 LAB — MICROSCOPIC EXAMINATION
RBC, Urine: NONE SEEN /hpf (ref 0–2)
WBC, UA: NONE SEEN /hpf (ref 0–5)

## 2020-04-04 LAB — URINALYSIS, COMPLETE
Bilirubin, UA: NEGATIVE
Glucose, UA: NEGATIVE
Leukocytes,UA: NEGATIVE
Nitrite, UA: POSITIVE — AB
Protein,UA: NEGATIVE
Specific Gravity, UA: 1.02 (ref 1.005–1.030)
Urobilinogen, Ur: 0.2 mg/dL (ref 0.2–1.0)
pH, UA: 5.5 (ref 5.0–7.5)

## 2020-04-06 ENCOUNTER — Emergency Department (HOSPITAL_COMMUNITY): Payer: Medicare PPO

## 2020-04-06 ENCOUNTER — Other Ambulatory Visit: Payer: Self-pay

## 2020-04-06 ENCOUNTER — Observation Stay (HOSPITAL_COMMUNITY): Payer: Medicare PPO

## 2020-04-06 ENCOUNTER — Inpatient Hospital Stay (HOSPITAL_COMMUNITY)
Admission: EM | Admit: 2020-04-06 | Discharge: 2020-04-14 | DRG: 177 | Disposition: A | Discharge status: Skilled Nursing Facility | Payer: Medicare PPO | Attending: Internal Medicine | Admitting: Internal Medicine

## 2020-04-06 ENCOUNTER — Encounter (HOSPITAL_COMMUNITY): Payer: Self-pay

## 2020-04-06 ENCOUNTER — Telehealth: Payer: Self-pay

## 2020-04-06 DIAGNOSIS — I959 Hypotension, unspecified: Secondary | ICD-10-CM | POA: Diagnosis present

## 2020-04-06 DIAGNOSIS — Z888 Allergy status to other drugs, medicaments and biological substances status: Secondary | ICD-10-CM

## 2020-04-06 DIAGNOSIS — J1282 Pneumonia due to coronavirus disease 2019: Secondary | ICD-10-CM | POA: Diagnosis present

## 2020-04-06 DIAGNOSIS — Z885 Allergy status to narcotic agent status: Secondary | ICD-10-CM

## 2020-04-06 DIAGNOSIS — D509 Iron deficiency anemia, unspecified: Secondary | ICD-10-CM | POA: Diagnosis present

## 2020-04-06 DIAGNOSIS — F445 Conversion disorder with seizures or convulsions: Secondary | ICD-10-CM | POA: Diagnosis not present

## 2020-04-06 DIAGNOSIS — I361 Nonrheumatic tricuspid (valve) insufficiency: Secondary | ICD-10-CM | POA: Diagnosis not present

## 2020-04-06 DIAGNOSIS — I10 Essential (primary) hypertension: Secondary | ICD-10-CM | POA: Diagnosis not present

## 2020-04-06 DIAGNOSIS — R531 Weakness: Secondary | ICD-10-CM

## 2020-04-06 DIAGNOSIS — R262 Difficulty in walking, not elsewhere classified: Secondary | ICD-10-CM | POA: Diagnosis not present

## 2020-04-06 DIAGNOSIS — G8929 Other chronic pain: Secondary | ICD-10-CM | POA: Diagnosis present

## 2020-04-06 DIAGNOSIS — N179 Acute kidney failure, unspecified: Secondary | ICD-10-CM | POA: Diagnosis present

## 2020-04-06 DIAGNOSIS — R0602 Shortness of breath: Secondary | ICD-10-CM

## 2020-04-06 DIAGNOSIS — R7989 Other specified abnormal findings of blood chemistry: Secondary | ICD-10-CM | POA: Diagnosis present

## 2020-04-06 DIAGNOSIS — R918 Other nonspecific abnormal finding of lung field: Secondary | ICD-10-CM | POA: Diagnosis not present

## 2020-04-06 DIAGNOSIS — N183 Chronic kidney disease, stage 3 unspecified: Secondary | ICD-10-CM | POA: Diagnosis present

## 2020-04-06 DIAGNOSIS — N1832 Chronic kidney disease, stage 3b: Secondary | ICD-10-CM | POA: Diagnosis present

## 2020-04-06 DIAGNOSIS — Z7982 Long term (current) use of aspirin: Secondary | ICD-10-CM

## 2020-04-06 DIAGNOSIS — I6523 Occlusion and stenosis of bilateral carotid arteries: Secondary | ICD-10-CM | POA: Diagnosis not present

## 2020-04-06 DIAGNOSIS — E875 Hyperkalemia: Secondary | ICD-10-CM | POA: Diagnosis present

## 2020-04-06 DIAGNOSIS — K449 Diaphragmatic hernia without obstruction or gangrene: Secondary | ICD-10-CM | POA: Diagnosis present

## 2020-04-06 DIAGNOSIS — J45909 Unspecified asthma, uncomplicated: Secondary | ICD-10-CM | POA: Diagnosis present

## 2020-04-06 DIAGNOSIS — Z79899 Other long term (current) drug therapy: Secondary | ICD-10-CM | POA: Diagnosis not present

## 2020-04-06 DIAGNOSIS — M109 Gout, unspecified: Secondary | ICD-10-CM | POA: Diagnosis present

## 2020-04-06 DIAGNOSIS — Z91018 Allergy to other foods: Secondary | ICD-10-CM

## 2020-04-06 DIAGNOSIS — U071 COVID-19: Secondary | ICD-10-CM | POA: Diagnosis present

## 2020-04-06 DIAGNOSIS — M545 Low back pain, unspecified: Secondary | ICD-10-CM | POA: Diagnosis present

## 2020-04-06 DIAGNOSIS — R9431 Abnormal electrocardiogram [ECG] [EKG]: Secondary | ICD-10-CM | POA: Diagnosis present

## 2020-04-06 DIAGNOSIS — R778 Other specified abnormalities of plasma proteins: Secondary | ICD-10-CM | POA: Diagnosis present

## 2020-04-06 DIAGNOSIS — M6250 Muscle wasting and atrophy, not elsewhere classified, unspecified site: Secondary | ICD-10-CM | POA: Diagnosis not present

## 2020-04-06 DIAGNOSIS — G8194 Hemiplegia, unspecified affecting left nondominant side: Secondary | ICD-10-CM | POA: Diagnosis not present

## 2020-04-06 DIAGNOSIS — I480 Paroxysmal atrial fibrillation: Secondary | ICD-10-CM | POA: Diagnosis present

## 2020-04-06 DIAGNOSIS — M1040 Other secondary gout, unspecified site: Secondary | ICD-10-CM | POA: Diagnosis not present

## 2020-04-06 DIAGNOSIS — Z91040 Latex allergy status: Secondary | ICD-10-CM

## 2020-04-06 DIAGNOSIS — Z90710 Acquired absence of both cervix and uterus: Secondary | ICD-10-CM

## 2020-04-06 DIAGNOSIS — R29818 Other symptoms and signs involving the nervous system: Secondary | ICD-10-CM | POA: Diagnosis not present

## 2020-04-06 DIAGNOSIS — R404 Transient alteration of awareness: Secondary | ICD-10-CM | POA: Diagnosis not present

## 2020-04-06 DIAGNOSIS — Z8616 Personal history of COVID-19: Secondary | ICD-10-CM | POA: Diagnosis not present

## 2020-04-06 DIAGNOSIS — B370 Candidal stomatitis: Secondary | ICD-10-CM | POA: Diagnosis not present

## 2020-04-06 DIAGNOSIS — Z8673 Personal history of transient ischemic attack (TIA), and cerebral infarction without residual deficits: Secondary | ICD-10-CM

## 2020-04-06 DIAGNOSIS — J90 Pleural effusion, not elsewhere classified: Secondary | ICD-10-CM | POA: Diagnosis not present

## 2020-04-06 DIAGNOSIS — G40509 Epileptic seizures related to external causes, not intractable, without status epilepticus: Secondary | ICD-10-CM | POA: Diagnosis not present

## 2020-04-06 DIAGNOSIS — R0902 Hypoxemia: Secondary | ICD-10-CM

## 2020-04-06 DIAGNOSIS — E669 Obesity, unspecified: Secondary | ICD-10-CM | POA: Diagnosis present

## 2020-04-06 DIAGNOSIS — I129 Hypertensive chronic kidney disease with stage 1 through stage 4 chronic kidney disease, or unspecified chronic kidney disease: Secondary | ICD-10-CM | POA: Diagnosis present

## 2020-04-06 DIAGNOSIS — M48062 Spinal stenosis, lumbar region with neurogenic claudication: Secondary | ICD-10-CM | POA: Diagnosis not present

## 2020-04-06 DIAGNOSIS — E639 Nutritional deficiency, unspecified: Secondary | ICD-10-CM | POA: Diagnosis not present

## 2020-04-06 DIAGNOSIS — I517 Cardiomegaly: Secondary | ICD-10-CM | POA: Diagnosis not present

## 2020-04-06 DIAGNOSIS — I340 Nonrheumatic mitral (valve) insufficiency: Secondary | ICD-10-CM | POA: Diagnosis not present

## 2020-04-06 DIAGNOSIS — Z7951 Long term (current) use of inhaled steroids: Secondary | ICD-10-CM | POA: Diagnosis not present

## 2020-04-06 DIAGNOSIS — R000 Tachycardia, unspecified: Secondary | ICD-10-CM | POA: Diagnosis not present

## 2020-04-06 DIAGNOSIS — Z8249 Family history of ischemic heart disease and other diseases of the circulatory system: Secondary | ICD-10-CM

## 2020-04-06 DIAGNOSIS — Z8701 Personal history of pneumonia (recurrent): Secondary | ICD-10-CM

## 2020-04-06 DIAGNOSIS — G459 Transient cerebral ischemic attack, unspecified: Secondary | ICD-10-CM

## 2020-04-06 DIAGNOSIS — E871 Hypo-osmolality and hyponatremia: Secondary | ICD-10-CM | POA: Diagnosis present

## 2020-04-06 DIAGNOSIS — Z7401 Bed confinement status: Secondary | ICD-10-CM | POA: Diagnosis not present

## 2020-04-06 DIAGNOSIS — R279 Unspecified lack of coordination: Secondary | ICD-10-CM | POA: Diagnosis not present

## 2020-04-06 DIAGNOSIS — Z743 Need for continuous supervision: Secondary | ICD-10-CM | POA: Diagnosis not present

## 2020-04-06 DIAGNOSIS — J99 Respiratory disorders in diseases classified elsewhere: Secondary | ICD-10-CM | POA: Diagnosis not present

## 2020-04-06 DIAGNOSIS — Z882 Allergy status to sulfonamides status: Secondary | ICD-10-CM

## 2020-04-06 DIAGNOSIS — M6281 Muscle weakness (generalized): Secondary | ICD-10-CM | POA: Diagnosis not present

## 2020-04-06 DIAGNOSIS — Z6834 Body mass index (BMI) 34.0-34.9, adult: Secondary | ICD-10-CM | POA: Diagnosis not present

## 2020-04-06 DIAGNOSIS — Z880 Allergy status to penicillin: Secondary | ICD-10-CM

## 2020-04-06 DIAGNOSIS — E66812 Obesity, class 2: Secondary | ICD-10-CM | POA: Diagnosis present

## 2020-04-06 DIAGNOSIS — R5381 Other malaise: Secondary | ICD-10-CM | POA: Diagnosis not present

## 2020-04-06 DIAGNOSIS — G40909 Epilepsy, unspecified, not intractable, without status epilepticus: Secondary | ICD-10-CM | POA: Diagnosis present

## 2020-04-06 LAB — CBC WITH DIFFERENTIAL/PLATELET
Abs Immature Granulocytes: 0.18 10*3/uL — ABNORMAL HIGH (ref 0.00–0.07)
Basophils Absolute: 0.1 10*3/uL (ref 0.0–0.1)
Basophils Relative: 1 %
Eosinophils Absolute: 0 10*3/uL (ref 0.0–0.5)
Eosinophils Relative: 0 %
HCT: 31.1 % — ABNORMAL LOW (ref 36.0–46.0)
Hemoglobin: 9.6 g/dL — ABNORMAL LOW (ref 12.0–15.0)
Immature Granulocytes: 2 %
Lymphocytes Relative: 15 %
Lymphs Abs: 1.6 10*3/uL (ref 0.7–4.0)
MCH: 24.6 pg — ABNORMAL LOW (ref 26.0–34.0)
MCHC: 30.9 g/dL (ref 30.0–36.0)
MCV: 79.7 fL — ABNORMAL LOW (ref 80.0–100.0)
Monocytes Absolute: 0.1 10*3/uL (ref 0.1–1.0)
Monocytes Relative: 1 %
Neutro Abs: 8.8 10*3/uL — ABNORMAL HIGH (ref 1.7–7.7)
Neutrophils Relative %: 81 %
Platelets: 325 10*3/uL (ref 150–400)
RBC: 3.9 MIL/uL (ref 3.87–5.11)
RDW: 23.9 % — ABNORMAL HIGH (ref 11.5–15.5)
WBC: 10.7 10*3/uL — ABNORMAL HIGH (ref 4.0–10.5)
nRBC: 3.8 % — ABNORMAL HIGH (ref 0.0–0.2)

## 2020-04-06 LAB — COMPREHENSIVE METABOLIC PANEL
ALT: 8 U/L (ref 0–44)
AST: 36 U/L (ref 15–41)
Albumin: 2.7 g/dL — ABNORMAL LOW (ref 3.5–5.0)
Alkaline Phosphatase: 48 U/L (ref 38–126)
Anion gap: 10 (ref 5–15)
BUN: 19 mg/dL (ref 8–23)
CO2: 20 mmol/L — ABNORMAL LOW (ref 22–32)
Calcium: 9.3 mg/dL (ref 8.9–10.3)
Chloride: 102 mmol/L (ref 98–111)
Creatinine, Ser: 1.54 mg/dL — ABNORMAL HIGH (ref 0.44–1.00)
GFR, Estimated: 31 mL/min — ABNORMAL LOW (ref >60)
Glucose, Bld: 100 mg/dL — ABNORMAL HIGH (ref 70–99)
Potassium: 5.2 mmol/L — ABNORMAL HIGH (ref 3.5–5.1)
Sodium: 132 mmol/L — ABNORMAL LOW (ref 135–145)
Total Bilirubin: 0.8 mg/dL (ref 0.3–1.2)
Total Protein: 6.7 g/dL (ref 6.5–8.1)

## 2020-04-06 LAB — RESP PANEL BY RT-PCR (FLU A&B, COVID) ARPGX2
Influenza A by PCR: NEGATIVE
Influenza B by PCR: NEGATIVE
SARS Coronavirus 2 by RT PCR: POSITIVE — AB

## 2020-04-06 LAB — FERRITIN: Ferritin: 38 ng/mL (ref 11–307)

## 2020-04-06 LAB — PROTIME-INR
INR: 1.1 (ref 0.8–1.2)
Prothrombin Time: 14 seconds (ref 11.4–15.2)

## 2020-04-06 LAB — LACTIC ACID, PLASMA
Lactic Acid, Venous: 1.4 mmol/L (ref 0.5–1.9)
Lactic Acid, Venous: 2.1 mmol/L (ref 0.5–1.9)

## 2020-04-06 LAB — PHOSPHORUS: Phosphorus: 3.4 mg/dL (ref 2.5–4.6)

## 2020-04-06 LAB — TROPONIN I (HIGH SENSITIVITY)
Troponin I (High Sensitivity): 25 ng/L — ABNORMAL HIGH (ref <18)
Troponin I (High Sensitivity): 25 ng/L — ABNORMAL HIGH (ref <18)

## 2020-04-06 LAB — PROCALCITONIN: Procalcitonin: <0.1 ng/mL

## 2020-04-06 LAB — BRAIN NATRIURETIC PEPTIDE: B Natriuretic Peptide: 1025 pg/mL — ABNORMAL HIGH (ref 0.0–100.0)

## 2020-04-06 LAB — APTT: aPTT: 31 seconds (ref 24–36)

## 2020-04-06 LAB — VALPROIC ACID LEVEL: Valproic Acid Lvl: 73 ug/mL (ref 50.0–100.0)

## 2020-04-06 LAB — D-DIMER, QUANTITATIVE: D-Dimer, Quant: 3.08 ug/mL-FEU — ABNORMAL HIGH (ref 0.00–0.50)

## 2020-04-06 LAB — MAGNESIUM: Magnesium: 2.1 mg/dL (ref 1.7–2.4)

## 2020-04-06 LAB — C-REACTIVE PROTEIN: CRP: 7.8 mg/dL — ABNORMAL HIGH (ref <1.0)

## 2020-04-06 LAB — FIBRINOGEN: Fibrinogen: 506 mg/dL — ABNORMAL HIGH (ref 210–475)

## 2020-04-06 MED ORDER — ONDANSETRON HCL 4 MG PO TABS: 4.0000 mg | ORAL_TABLET | Freq: Four times a day (QID) | ORAL | Status: DC | PRN

## 2020-04-06 MED ORDER — ALLOPURINOL 100 MG PO TABS
50.0000 mg | ORAL_TABLET | Freq: Every day | ORAL | Status: DC
  Administered 2020-04-08 – 2020-04-14 (×7): 50 mg via ORAL
  Filled 2020-04-06 (×4): qty 1
  Filled 2020-04-06: qty 0.5
  Filled 2020-04-06 (×3): qty 1
  Filled 2020-04-06: qty 0.5
  Filled 2020-04-06: qty 1

## 2020-04-06 MED ORDER — GUAIFENESIN-DM 100-10 MG/5ML PO SYRP
10.0000 mL | ORAL_SOLUTION | ORAL | Status: DC | PRN
  Administered 2020-04-11 (×2): 10 mL via ORAL
  Filled 2020-04-06 (×2): qty 10

## 2020-04-06 MED ORDER — PROCHLORPERAZINE EDISYLATE 10 MG/2ML IJ SOLN: 5.0000 mg | INTRAMUSCULAR | Status: DC | PRN

## 2020-04-06 MED ORDER — ACETAMINOPHEN 650 MG RE SUPP: 650.0000 mg | Freq: Four times a day (QID) | RECTAL | Status: DC | PRN

## 2020-04-06 MED ORDER — DEXAMETHASONE SODIUM PHOSPHATE 10 MG/ML IJ SOLN
6.0000 mg | INTRAMUSCULAR | Status: DC
  Administered 2020-04-06 – 2020-04-11 (×6): 6 mg via INTRAVENOUS
  Filled 2020-04-06 (×6): qty 1

## 2020-04-06 MED ORDER — VANCOMYCIN VARIABLE DOSE PER UNSTABLE RENAL FUNCTION (PHARMACIST DOSING): Status: DC

## 2020-04-06 MED ORDER — SODIUM CHLORIDE 0.9 % IV SOLN
2.0000 g | Freq: Once | INTRAVENOUS | Status: AC
  Administered 2020-04-06: 15:00:00 2 g via INTRAVENOUS
  Filled 2020-04-06: qty 2

## 2020-04-06 MED ORDER — FLUTICASONE FUROATE 200 MCG/ACT IN AEPB: 1.0000 | INHALATION_SPRAY | Freq: Every day | RESPIRATORY_TRACT | Status: DC

## 2020-04-06 MED ORDER — ALBUTEROL SULFATE HFA 108 (90 BASE) MCG/ACT IN AERS
2.0000 | INHALATION_SPRAY | Freq: Four times a day (QID) | RESPIRATORY_TRACT | Status: DC
  Administered 2020-04-06: 20:00:00 2 via RESPIRATORY_TRACT
  Filled 2020-04-06: qty 6.7

## 2020-04-06 MED ORDER — ACETAMINOPHEN 325 MG PO TABS
650.0000 mg | ORAL_TABLET | Freq: Four times a day (QID) | ORAL | Status: DC | PRN
  Administered 2020-04-11 – 2020-04-14 (×6): 650 mg via ORAL
  Filled 2020-04-06 (×6): qty 2

## 2020-04-06 MED ORDER — ASCORBIC ACID 500 MG PO TABS
500.0000 mg | ORAL_TABLET | Freq: Every day | ORAL | Status: DC
  Administered 2020-04-06 – 2020-04-14 (×8): 500 mg via ORAL
  Filled 2020-04-06 (×9): qty 1

## 2020-04-06 MED ORDER — FLUTICASONE PROPIONATE HFA 110 MCG/ACT IN AERO
1.0000 | INHALATION_SPRAY | Freq: Two times a day (BID) | RESPIRATORY_TRACT | Status: DC
  Administered 2020-04-07 – 2020-04-14 (×14): 1 via RESPIRATORY_TRACT
  Filled 2020-04-06: qty 12

## 2020-04-06 MED ORDER — LEVETIRACETAM ER 500 MG PO TB24
2000.0000 mg | ORAL_TABLET | Freq: Every day | ORAL | Status: DC
  Administered 2020-04-06 – 2020-04-13 (×8): 2000 mg via ORAL
  Filled 2020-04-06 (×9): qty 4

## 2020-04-06 MED ORDER — POLYETHYLENE GLYCOL 3350 17 G PO PACK: 17.0000 g | PACK | Freq: Every day | ORAL | Status: DC | PRN

## 2020-04-06 MED ORDER — ZINC SULFATE 220 (50 ZN) MG PO CAPS
220.0000 mg | ORAL_CAPSULE | Freq: Every day | ORAL | Status: DC
  Administered 2020-04-06 – 2020-04-14 (×8): 220 mg via ORAL
  Filled 2020-04-06 (×9): qty 1

## 2020-04-06 MED ORDER — REMDESIVIR 100 MG IV SOLR
100.0000 mg | INTRAVENOUS | Status: AC
  Administered 2020-04-06 (×2): 100 mg via INTRAVENOUS
  Filled 2020-04-06 (×3): qty 20

## 2020-04-06 MED ORDER — VANCOMYCIN HCL 2000 MG/400ML IV SOLN
2000.0000 mg | Freq: Once | INTRAVENOUS | Status: AC
  Administered 2020-04-06: 16:00:00 2000 mg via INTRAVENOUS
  Filled 2020-04-06: qty 400

## 2020-04-06 MED ORDER — DEXAMETHASONE SODIUM PHOSPHATE 10 MG/ML IJ SOLN: 10.0000 mg | Freq: Once | INTRAMUSCULAR | Status: DC

## 2020-04-06 MED ORDER — VANCOMYCIN HCL IN DEXTROSE 1-5 GM/200ML-% IV SOLN
1000.0000 mg | Freq: Once | INTRAVENOUS | Status: DC
  Filled 2020-04-06: qty 200

## 2020-04-06 MED ORDER — ENOXAPARIN SODIUM 30 MG/0.3ML ~~LOC~~ SOLN
30.0000 mg | SUBCUTANEOUS | Status: DC
  Administered 2020-04-06 – 2020-04-11 (×6): 30 mg via SUBCUTANEOUS
  Filled 2020-04-06 (×6): qty 0.3

## 2020-04-06 MED ORDER — AMIODARONE HCL 200 MG PO TABS
200.0000 mg | ORAL_TABLET | Freq: Every day | ORAL | Status: DC
  Administered 2020-04-07 – 2020-04-14 (×8): 200 mg via ORAL
  Filled 2020-04-06 (×8): qty 1

## 2020-04-06 MED ORDER — ENOXAPARIN SODIUM 40 MG/0.4ML ~~LOC~~ SOLN: 40.0000 mg | SUBCUTANEOUS | Status: DC

## 2020-04-06 MED ORDER — ONDANSETRON HCL 4 MG/2ML IJ SOLN: 4.0000 mg | Freq: Four times a day (QID) | INTRAMUSCULAR | Status: DC | PRN

## 2020-04-06 MED ORDER — SODIUM CHLORIDE 0.9 % IV SOLN
100.0000 mg | Freq: Every day | INTRAVENOUS | Status: AC
  Administered 2020-04-07 – 2020-04-10 (×4): 100 mg via INTRAVENOUS
  Filled 2020-04-06 (×4): qty 20

## 2020-04-06 MED ORDER — MAGNESIUM SULFATE 2 GM/50ML IV SOLN
2.0000 g | Freq: Once | INTRAVENOUS | Status: AC
  Administered 2020-04-06: 20:00:00 2 g via INTRAVENOUS
  Filled 2020-04-06: qty 50

## 2020-04-06 MED ORDER — MONTELUKAST SODIUM 10 MG PO TABS
10.0000 mg | ORAL_TABLET | Freq: Every day | ORAL | Status: DC
  Administered 2020-04-06 – 2020-04-13 (×8): 10 mg via ORAL
  Filled 2020-04-06 (×8): qty 1

## 2020-04-06 MED ORDER — SODIUM CHLORIDE 0.9 % IV BOLUS
1000.0000 mL | Freq: Once | INTRAVENOUS | Status: AC
  Administered 2020-04-06: 15:00:00 1000 mL via INTRAVENOUS

## 2020-04-06 MED ORDER — SODIUM CHLORIDE 0.9 % IV SOLN
2.0000 g | INTRAVENOUS | Status: DC
  Administered 2020-04-06: 21:00:00 2 g via INTRAVENOUS
  Filled 2020-04-06: qty 2

## 2020-04-06 MED ORDER — ASPIRIN EC 81 MG PO TBEC
81.0000 mg | DELAYED_RELEASE_TABLET | Freq: Every day | ORAL | Status: DC
  Administered 2020-04-07 – 2020-04-14 (×8): 81 mg via ORAL
  Filled 2020-04-06 (×8): qty 1

## 2020-04-06 MED ORDER — DIVALPROEX SODIUM 250 MG PO DR TAB
500.0000 mg | DELAYED_RELEASE_TABLET | Freq: Two times a day (BID) | ORAL | Status: DC
  Administered 2020-04-06 – 2020-04-14 (×15): 500 mg via ORAL
  Filled 2020-04-06 (×16): qty 2

## 2020-04-06 NOTE — ED Triage Notes (Signed)
Called out d/t family members being concerned over left sided weakness and difficulty getting up like normal. Last known well time yesterday approx 1400.  Also reports O2 was 76% on room air upon arrival - 4L via [place] applied which increased O2 to 99%. Does not wear O2 at home. Able to answer questions and follow commands. Alert to person, place, situation. Disoriented to time.

## 2020-04-06 NOTE — H&P (Addendum)
History and Physical    [name] CMK:[id number] DOB: 02-18-1924 DOA: 04/06/2020  PCP: [name], [name]   Patient coming from: Home.   I have personally briefly reviewed patient's old medical records in [place]  Chief Complaint: Left-sided weakness.  HPI: [name] is a 86 y.o. female with medical history significant of chronic lower back pain, degenerative arthritis, eczema, edema, gout, headache, hiatal hernia, hypertension, internal hemorrhoids, neurogenic claudication, class II obesity, seizure disorder, history of other nonhemorrhagic stroke, urticaria who is coming to the emergency department after her relatives noticed she was having left-sided weakness.  Her daughter told Dr. Eulis Foster that she has not been eating or drinking much recently.  She has also been complaining of generalized weakness to her daughter for the past several days.  She denies fever, but complains of chills, fatigue, dyspnea and nonproductive cough.  No abdominal pain, nausea or vomiting, diarrhea, constipation, melena or hematochezia.  No dysuria, frequency or hematuria.  No polyuria, polydipsia, polyphagia or blurred vision.  She is unvaccinated against coronavirus.  ED Course: Initial vital signs were temperature 97.6 F, pulse 110, respirations 16, BP 74/53 mmHg and O2 sat low to mid 90s on room air.  The patient received a 1000 mL NS bolus, aztreonam, cefepime, vancomycin, dexamethasone 6 mg IVP and remdesivir.  Labwork: CBC showed a white count of 10.7 with 81% neutrophils 50% lymphocytes.  Hemoglobin is 9.6 g/dL with an MCV of 79.7 fL and platelets were 325.  PT/INR/PTT within normal range.  D-dimer was 3.08 mcg/mL and fibrinogen was 506 mg/dL.  Troponin was 25 ng/L x 2.  BNP 1025 pg/mL.  CRP was 7.8 mg/dL.  Procalcitonin less than 0.10 ng/mL.  Lactic acid was 1.4 and then 2.1 mmol/L.  Sodium 132, potassium 5.2, chloride 102 and CO2 20 mmol/L.  Anion gap was normal.  Glucose 100, BUN 19 and  creatinine 1.54 mg/dL.  LFTs are normal except for an albumin of 2.7 g/dL.  Valproic acid was normal.  Imaging: Chest radiograph shows multifocal pneumonia consistent with COVID-19 disease.  Please see images and full radiology report for further detail.  Review of Systems: As per HPI otherwise all other systems reviewed and are negative.  Past Medical History:  Diagnosis Date  . Chronic low back pain   . Degenerative arthritis   . Eczema   . Edema   . Gout   . Headache   . Hiatal hernia   . Hypertension   . Internal hemorrhoids   . Neurogenic claudication ([place])   . Obesity   . Seizure disorder ([place])   . Seizures ([place])   . Stroke ([place])   . Urticaria    Past Surgical History:  Procedure Laterality Date  . ABDOMINAL HYSTERECTOMY    . APPENDECTOMY    . APPENDECTOMY    . bilateral knees    . cataracts    . CHOLECYSTECTOMY    . mammoplasty reduction    . NEUROPLASTY / TRANSPOSITION MEDIAN NERVE AT CARPAL TUNNEL BILATERAL    . RE-EXCISION OF BREAST LUMPECTOMY    . TUBAL LIGATION     Social History  reports that she has never smoked. She has never used smokeless tobacco. She reports that she does not drink alcohol and does not use drugs.  Allergies  Allergen Reactions  . Advicor [Niacin-Lovastatin Er] Other (See Comments)    Reaction unknown to daughter  . Caduet [Amlodipine-Atorvastatin] Other (See Comments)    Reaction unknown to daughter  .  Codeine Other (See Comments)    Reaction unknown to daughter  . Dilantin [Phenytoin] Other (See Comments)    Reaction unknown to daughter  . Lescol [Fluvastatin] Other (See Comments)    Reaction unknown to daughter  . Pamelor [Nortriptyline] Other (See Comments)    Reaction unknown to daughter  . Penicillins Hives    Did it involve swelling of the face/tongue/throat, SOB, or low BP? Unk Did it involve sudden or severe rash/hives, skin peeling, or any reaction on the inside of your mouth or nose? Unk Did you need to seek medical  attention at a hospital or doctor's office? Unk When did it last happen? "it was a long time ago" If all above answers are "NO", may proceed with cephalosporin use.   . Sulfa Antibiotics Hives  . Trileptal [Oxcarbazepine] Other (See Comments)    Reaction unknown to daughter  . Ultram [Tramadol] Other (See Comments)    Reaction unknown to daughter  . Welchol [Colesevelam] Other (See Comments)    Reaction unknown to daughter  . Latex Rash  . Tomato Rash   Family History  Problem Relation Age of Onset  . Diabetes Mother   . Heart disease Mother   . Heart disease Father   . Cancer Sister   . Cancer Brother   . Cancer Sister    Prior to Admission medications   Medication Sig Start Date End Date Taking? Authorizing Provider  acetaminophen (TYLENOL) 500 MG tablet Take 500-1,000 mg by mouth every 8 (eight) hours as needed for mild pain or headache.    Yes [provider]  allopurinol (ZYLOPRIM) 100 MG tablet TAKE 1/2 TABLET BY MOUTH EVERY DAY Patient taking differently: Take 50 mg by mouth daily. 09/24/19  Yes Hawks, Christy A, [name]  amiodarone (PACERONE) 200 MG tablet Take 1 tablet (200 mg total) by mouth daily. 03/02/20  Yes Kilroy, Doreene Burke, PA-C  aspirin 81 MG tablet Take 81 mg by mouth daily.   Yes [provider]  divalproex (DEPAKOTE) 250 MG DR tablet TAKE 2 TABLETS BY MOUTH 2 TIMES DAILY. 03/16/20  Yes Hawks, Christy A, [name]  Fluticasone Furoate (ARNUITY ELLIPTA) 200 MCG/ACT AEPB INHALE 1 PUFF BY MOUTH EVERY DAY Patient taking differently: Take 1 puff by mouth daily. 12/03/19  Yes Kozlow, Donnamarie Poag, MD  levETIRAcetam (KEPPRA XR) 500 MG 24 hr tablet Take 4 tablets (2,000 mg total) by mouth at bedtime. 02/09/20  Yes [name], MD  montelukast (SINGULAIR) 10 MG tablet TAKE 1 TABLET BY MOUTH EVERYDAY AT BEDTIME Patient taking differently: Take 10 mg by mouth at bedtime. 12/03/19  Yes Kozlow, Donnamarie Poag, MD  polyethylene glycol (MIRALAX / GLYCOLAX) 17 g packet Take 17 g  by mouth daily as needed for mild constipation. 02/09/20  Yes [name], MD  nystatin (MYCOSTATIN) 100000 UNIT/ML suspension Take 5 mLs (500,000 Units total) by mouth 4 (four) times daily. Patient not taking: No sig reported 03/08/20   [name] A, [name]  omeprazole (PRILOSEC) 40 MG capsule Take 1 capsule (40 mg total) by mouth daily. Patient not taking: Reported on 04/06/2020 07/03/18   Jiles Prows, MD   Physical Exam: Vitals:   04/06/20 1730 04/06/20 1800 04/06/20 1830 04/06/20 1900  BP: 127/82 (!) 155/88 (!) 154/80 (!) 166/87  Pulse: 87 86 86 87  Resp: 15 15 18 12   Temp:      TempSrc:      SpO2: 95% 96% 96% 96%  Weight:      Height:  Constitutional: Acutely ill, but currently in NAD Eyes: PERRL, lids and conjunctivae normal ENMT: Mucous membranes are mildly dry.. Posterior pharynx clear of any exudate or lesions. Neck: normal, supple, no masses, no thyromegaly Respiratory: Bilateral scattered crackles. Normal respiratory effort. No accessory muscle use.  Cardiovascular: Regular rate and rhythm, no murmurs / rubs / gallops.  2+ lower extremity pitting edema. 2+ pedal pulses. No carotid bruits.  Abdomen: Obese, nondistended.  Bowel sounds positive.  Soft, no tenderness, no masses palpated. No hepatosplenomegaly. Musculoskeletal: no clubbing / cyanosis.  Good ROM, no contractures. Normal muscle tone.  Skin: no rashes, lesions, ulcers on very limited otological examination. Neurologic: CN 2-12 grossly intact. Sensation intact, DTR normal. Strength 4/5 in left side extremities. Psychiatric: Alert and oriented x 3. Normal mood.   Labs on Admission: I have personally reviewed following labs and imaging studies  CBC: Recent Labs  Lab 04/06/20 1430  WBC 10.7*  NEUTROABS 8.8*  HGB 9.6*  HCT 31.1*  MCV 79.7*  PLT 846    Basic Metabolic Panel: Recent Labs  Lab 04/06/20 1430  NA 132*  K 5.2*  CL 102  CO2 20*  GLUCOSE 100*  BUN 19  CREATININE 1.54*   CALCIUM 9.3    GFR: Estimated Creatinine Clearance: 24.5 mL/min (A) (by C-G formula based on SCr of 1.54 mg/dL (H)).  Liver Function Tests: Recent Labs  Lab 04/06/20 1430  AST 36  ALT 8  ALKPHOS 48  BILITOT 0.8  PROT 6.7  ALBUMIN 2.7*   Radiological Exams on Admission: CT Head Wo Contrast  Result Date: 04/06/2020 CLINICAL DATA:  Left-sided weakness. EXAM: CT HEAD WITHOUT CONTRAST TECHNIQUE: Contiguous axial images were obtained from the base of the skull through the vertex without intravenous contrast. COMPARISON:  CT head March 12, 2019 FINDINGS: Brain: No evidence of acute large vascular territory infarction, hemorrhage, hydrocephalus, extra-axial collection or mass lesion/mass effect. Patchy white matter hypoattenuation appears similar to prior and most likely represents the sequela of chronic microvascular ischemic disease. Similar area of calcification in the inferior left anterior temporal lobe. There is venous gas in the region of the basal venous plexus, right cavernous sinus, right infratemporal fossa, and parapharyngeal space, likely related t gas with IV access. Vascular: Calcific atherosclerosis. No hyperdense vessel identified. Skull: No acute fracture. Sinuses/Orbits: Sinuses are clear.  Unremarkable orbits. Other: No mastoid effusions. IMPRESSION: 1. No evidence of acute intracranial abnormality. 2. Chronic microvascular ischemic disease. Electronically Signed   By: Margaretha Sheffield MD   On: 04/06/2020 16:40   US Venous Img Lower Unilateral Left  Result Date: 04/06/2020 CLINICAL DATA:  Weakness EXAM: LEFT LOWER EXTREMITY VENOUS DOPPLER ULTRASOUND TECHNIQUE: Gray-scale sonography with compression, as well as color and duplex ultrasound, were performed to evaluate the deep venous system(s) from the level of the common femoral vein through the popliteal and proximal calf veins. COMPARISON:  None. FINDINGS: VENOUS Normal compressibility of the common femoral, superficial  femoral, and popliteal veins, as well as the visualized calf veins. Visualized portions of profunda femoral vein and great saphenous vein unremarkable. No filling defects to suggest DVT on grayscale or color Doppler imaging. Doppler waveforms show normal direction of venous flow, normal respiratory plasticity and response to augmentation. Limited views of the contralateral common femoral vein are unremarkable. OTHER None. Limitations: none IMPRESSION: Negative. Electronically Signed   By: Constance Holster M.D.   On: 04/06/2020 15:58   DG Chest Port 1 View  Result Date: 04/06/2020 CLINICAL DATA:  Weakness.  No chest  complaints. EXAM: PORTABLE CHEST 1 VIEW COMPARISON:  Chest x-ray dated March 08, 2020. FINDINGS: The patient is rotated to the left, limiting evaluation. This likely accounts for silhouetting of the left hemidiaphragm, with retrocardiac left lower lobe airspace disease less likely. The right lung is clear. No pneumothorax or large pleural effusion. No acute osseous abnormality. IMPRESSION: 1. Limited study due to rotation. No definite active disease, although repeat single-view chest x-ray without rotation or PA and lateral chest x-ray should be considered. Electronically Signed   By: Titus Dubin M.D.   On: 04/06/2020 15:03   01/10/2020 echocardiogram IMPRESSIONS:  1. Left ventricular ejection fraction, by estimation, is 60 to 65%. The  left ventricle has normal function. The left ventricle has no regional  wall motion abnormalities. There is mild concentric left ventricular  hypertrophy. Left ventricular diastolic  parameters are consistent with Grade I diastolic dysfunction (impaired  relaxation).  2. Right ventricular systolic function is normal. The right ventricular  size is normal. There is normal pulmonary artery systolic pressure.  3. Left atrial size was mildly dilated.  4. The mitral valve is normal in structure. Trivial mitral valve  regurgitation. No evidence  of mitral stenosis.  5. The aortic valve is normal in structure. Aortic valve regurgitation is  not visualized. Mild aortic valve sclerosis is present, with no evidence  of aortic valve stenosis.   EKG: Independently reviewed. EKG #1 Vent. rate 111 BPM PR interval * ms QRS duration 98 ms QT/QTc 371/505 ms P-R-T axes * -80 * Junctional tachycardia Left anterior fascicular block Low voltage, extremity and precordial leads Nonspecific T abnormalities, lateral leads Prolonged QT interval  EKG #2 Vent. rate 86 BPM PR interval * ms QRS duration 97 ms QT/QTc 479/573 ms P-R-T axes 70 -56 -13 Sinus rhythm Left anterior fascicular block Low voltage, extremity and precordial leads Abnormal R-wave progression, late transition Prolonged QT interval  Assessment/Plan Principal Problem:   2019 novel coronavirus disease (COVID-19) Observation/MedSurg. Continue supplemental oxygen. I-S and flutter valve. Advised to prone as tolerated. Bronchodilators as needed. Acetaminophen as needed. Antitussive/antiemetic as needed Remdesivir per pharmacy. Continue dexamethasone 6 mg IVP daily. Follow CBC, CMP and inflammatory markers.  Active Problems:   Left-sided weakness Swallow screen. PT/OT/SLP. Hemoglobin A1c. Fasting lipids. Check carotid Doppler. Check echocardiogram. Check MRI of brain.    Prolonged QT interval Magnesium was supplemented. Avoid QT interval prolonging meds Follow-up EKG in a.m.    Elevated troponin Check echocardiogram.    Acute renal failure superimposed on stage 3 chronic kidney disease (HCC) Hydrated with bolus earlier in the ED. Judicious IV hydration as needed.. Follow-up creatinine level in a.m.    Hyperkalemia Received fluids earlier. Follow-up potassium level in a.m.    Hyponatremia Decreased oral intake? Bilateral lower extremity edema. No cardiomegaly, but small pleural effusion. Follow monitor sodium level.    Hiatal  hernia Untreated. Asymptomatic. Meds may prolong QT interval.    Seizure disorder (HCC) Continue Depakote 500 mg p.o. twice daily. Continue Keppra 2000 mg extended release at bedtime.    Essential hypertension, benign Not on antihypertensives. Was hypotensive earlier. Monitor blood pressure.    Gout Continue allopurinol 100 mg p.o. daily.    Asthma Continue Singulair 10 mg p.o. at bedtime. Supplemental oxygen as needed. Bronchodilators as needed.    Class 2 obesity Lifestyle modifications. Follow-up with PCP.    Microcytic anemia Monitor H&H.   DVT prophylaxis: Lovenox SQ. Code Status:   Full code. Family Communication: Disposition Plan:   Patient is  from:  Home.  Anticipated DC to:  Home.  Anticipated DC date:  02/07/2020.  Anticipated DC barriers: Clinical improvement in condition.  Consults called: Admission status:  Observation/telemetry.  Severity of Illness:  Reubin Milan MD [facility]  How to contact the [facility] Attending or Consulting provider [place] or covering provider during after hours [place], for this patient?   1. Check the care team in [facility] and look for a) attending/consulting [facility] provider listed and b) the [facility] team listed 2. Log into [email or web address] and use [place]'s universal password to access. If you do not have the password, please contact the hospital operator. 3. Locate the [facility] provider you are looking for under [facility] and page to a number that you can be directly reached. 4. If you still have difficulty reaching the provider, please page the [facility] (Director on Call) for the Hospitalists listed on amion for assistance.  04/06/2020, 7:54 PM   This document was prepared using Dragon voice recognition software and may contain some unintended transcription errors.

## 2020-04-06 NOTE — ED Provider Notes (Signed)
[facility] EMERGENCY DEPARTMENT Provider Note   CSN: [id number] Arrival date & time: 04/06/20  1410     History Chief Complaint  Patient presents with  . Extremity Weakness    Left arm weakness     [name] is a 86 y.o. female.  Patient was last seen normal 2 PM yesterday afternoon.  She states that she was feeling weak and that she was more weak in the left arm since yesterday and also has some weakness in the left leg but she says that seems to be getting worse patient also complains of pain in her left thigh.  The history is provided by the patient and medical records. No language interpreter was used.  Extremity Weakness This is a new problem. The current episode started yesterday. The problem occurs constantly. The problem has not changed since onset.Pertinent negatives include no chest pain, no abdominal pain and no headaches. Nothing aggravates the symptoms. Nothing relieves the symptoms.  Weakness Associated symptoms: no abdominal pain, no chest pain, no cough, no diarrhea, no frequency, no headaches and no seizures        Past Medical History:  Diagnosis Date  . Chronic low back pain   . Degenerative arthritis   . Eczema   . Edema   . Gout   . Headache   . Hiatal hernia   . Hypertension   . Internal hemorrhoids   . Neurogenic claudication ([place])   . Obesity   . Seizure disorder ([place])   . Seizures ([place])   . Stroke ([place])   . Urticaria     Patient Active Problem List   Diagnosis Date Noted  . Palliative care by specialist   . Goals of care, counseling/discussion   . SVT (supraventricular tachycardia) ([place]) 02/04/2020  . PNA (pneumonia) 02/03/2020  . Atrial fibrillation with RVR ([place]) 01/10/2020  . A-fib ([place]) 01/09/2020  . Pneumonia 01/04/2020  . Class 2 obesity 01/04/2020  . CKD (chronic kidney disease) stage 3, GFR 30-59 ml/min (HCC) 01/04/2020  . Nausea vomiting and diarrhea 03/12/2019  . Lung nodules 03/12/2019  . Atypical chest pain  12/31/2018  . Asthma 02/12/2017  . Chest pain 11/13/2016  . History of seizures 11/13/2016  . History of stroke 11/13/2016  . Allergic urticaria 08/30/2016  . GAD (generalized anxiety disorder) 08/30/2016  . Gout 08/30/2016  . Acute renal failure superimposed on stage 3 chronic kidney disease ([place]) 10/11/2015  . Neurogenic claudication ([place])   . Morbid obesity ([place])   . Edema   . Essential hypertension, benign 09/30/2014  . Secondary hyperparathyroidism, renal ([place]) 09/30/2014  . Proteinuria 09/30/2014  . Anemia associated with chronic renal failure 09/30/2014  . Chronic kidney disease (CKD), stage IV (severe) ([place]) 09/30/2014  . Hiatal hernia 08/21/2012  . Seizure disorder ([place]) 08/21/2012    Past Surgical History:  Procedure Laterality Date  . ABDOMINAL HYSTERECTOMY    . APPENDECTOMY    . APPENDECTOMY    . bilateral knees    . cataracts    . CHOLECYSTECTOMY    . mammoplasty reduction    . NEUROPLASTY / TRANSPOSITION MEDIAN NERVE AT CARPAL TUNNEL BILATERAL    . RE-EXCISION OF BREAST LUMPECTOMY    . TUBAL LIGATION       OB History    Gravida  6   Para  4   Term  4   Preterm      AB  2   Living        SAB  2   IAB      Ectopic      Multiple      Live Births              Family History  Problem Relation Age of Onset  . Diabetes Mother   . Heart disease Mother   . Heart disease Father   . Cancer Sister   . Cancer Brother   . Cancer Sister     Social History   Tobacco Use  . Smoking status: Never Smoker  . Smokeless tobacco: Never Used  Vaping Use  . Vaping Use: Never used  Substance Use Topics  . Alcohol use: No  . Drug use: No    Home Medications Prior to Admission medications   Medication Sig Start Date End Date Taking? Authorizing Provider  acetaminophen (TYLENOL) 500 MG tablet Take 500-1,000 mg by mouth every 8 (eight) hours as needed for mild pain or headache.     [provider]  allopurinol (ZYLOPRIM) 100 MG tablet  TAKE 1/2 TABLET BY MOUTH EVERY DAY Patient taking differently: Take 50 mg by mouth daily.  09/24/19   [name], [name]  amiodarone (PACERONE) 200 MG tablet Take 1 tablet (200 mg total) by mouth daily. 03/02/20   Erlene Quan, PA-C  aspirin 81 MG tablet Take 81 mg by mouth daily.    [provider]  divalproex (DEPAKOTE) 250 MG DR tablet TAKE 2 TABLETS BY MOUTH 2 TIMES DAILY. 03/16/20   Hawks, Alyse Low A, [name]  Fluticasone Furoate (ARNUITY ELLIPTA) 200 MCG/ACT AEPB INHALE 1 PUFF BY MOUTH EVERY DAY Patient taking differently: Take 1 puff by mouth daily.  12/03/19   Kozlow, Donnamarie Poag, MD  levETIRAcetam (KEPPRA XR) 500 MG 24 hr tablet Take 4 tablets (2,000 mg total) by mouth at bedtime. 02/09/20   [name], MD  montelukast (SINGULAIR) 10 MG tablet TAKE 1 TABLET BY MOUTH EVERYDAY AT BEDTIME Patient taking differently: Take 10 mg by mouth at bedtime.  12/03/19   Kozlow, Donnamarie Poag, MD  nystatin (MYCOSTATIN) 100000 UNIT/ML suspension Take 5 mLs (500,000 Units total) by mouth 4 (four) times daily. 03/08/20   [name], [name]  omeprazole (PRILOSEC) 40 MG capsule Take 1 capsule (40 mg total) by mouth daily. Patient taking differently: Take 40 mg by mouth daily as needed (for reflux).  07/03/18   Kozlow, Donnamarie Poag, MD  polyethylene glycol (MIRALAX / GLYCOLAX) 17 g packet Take 17 g by mouth daily as needed for mild constipation. 02/09/20   [name], MD    Allergies    Advicor [niacin-lovastatin er], Caduet [amlodipine-atorvastatin], Codeine, Dilantin [phenytoin], Lescol [fluvastatin], Pamelor [nortriptyline], Penicillins, Sulfa antibiotics, Trileptal [oxcarbazepine], Ultram [tramadol], Welchol [colesevelam], Latex, and Tomato  Review of Systems   Review of Systems  Constitutional: Negative for appetite change and fatigue.  HENT: Negative for congestion, ear discharge and sinus pressure.   Eyes: Negative for discharge.  Respiratory: Negative for cough.   Cardiovascular:  Negative for chest pain.  Gastrointestinal: Negative for abdominal pain and diarrhea.  Genitourinary: Negative for frequency and hematuria.  Musculoskeletal: Positive for extremity weakness. Negative for back pain.  Skin: Negative for rash.  Neurological: Positive for weakness. Negative for seizures and headaches.  Psychiatric/Behavioral: Negative for hallucinations.    Physical Exam Updated Vital Signs BP (!) 74/53 (BP Location: Left Arm)   Pulse (!) 106   Temp 97.6 F (36.4 C) (Oral)   Resp 15   Ht 5\' 4"  (1.626 m)  SpO2 98%   BMI 34.84 kg/m   Physical Exam Vitals and nursing note reviewed.  Constitutional:      Appearance: She is well-developed.  HENT:     Head: Normocephalic.     Nose: Nose normal.  Eyes:     General: No scleral icterus.    Extraocular Movements: EOM normal.     Conjunctiva/sclera: Conjunctivae normal.  Neck:     Thyroid: No thyromegaly.  Cardiovascular:     Rate and Rhythm: Normal rate and regular rhythm.     Heart sounds: No murmur heard. No friction rub. No gallop.   Pulmonary:     Breath sounds: No stridor. No wheezing or rales.  Chest:     Chest wall: No tenderness.  Abdominal:     General: There is no distension.     Tenderness: There is no abdominal tenderness. There is no rebound.  Musculoskeletal:        General: No edema. Normal range of motion.     Cervical back: Neck supple.     Comments: Tenderness to posterior thigh  Lymphadenopathy:     Cervical: No cervical adenopathy.  Skin:    Findings: No erythema or rash.  Neurological:     Mental Status: She is alert and oriented to person, place, and time.     Motor: No abnormal muscle tone.     Coordination: Coordination normal.     Comments: Patient has mild weakness in the left arm and left leg  Psychiatric:        Mood and Affect: Mood and affect normal.        Behavior: Behavior normal.     ED Results / Procedures / Treatments   Labs (all labs ordered are listed, but  only abnormal results are displayed) Labs Reviewed  RESP PANEL BY RT-PCR (FLU A&B, COVID) ARPGX2  CULTURE, BLOOD (ROUTINE X 2)  CULTURE, BLOOD (ROUTINE X 2)  URINE CULTURE  CBC WITH DIFFERENTIAL/PLATELET  COMPREHENSIVE METABOLIC PANEL  VALPROIC ACID LEVEL  URINALYSIS, ROUTINE W REFLEX MICROSCOPIC  LACTIC ACID, PLASMA  LACTIC ACID, PLASMA  PROTIME-INR  APTT  TROPONIN I (HIGH SENSITIVITY)    EKG EKG Interpretation  Date/Time:  Wednesday April 06 2020 14:25:20 EST Ventricular Rate:  111 PR Interval:    QRS Duration: 98 QT Interval:  371 QTC Calculation: 505 R Axis:   -80 Text Interpretation: Junctional tachycardia Left anterior fascicular block Low voltage, extremity and precordial leads Nonspecific T abnormalities, lateral leads Prolonged QT interval Confirmed by Milton Ferguson [id number]) on 04/06/2020 2:38:27 PM   Radiology No results found.  Procedures Procedures (including critical care time)  Medications Ordered in ED Medications  sodium chloride 0.9 % bolus 1,000 mL (has no administration in time range)  aztreonam (AZACTAM) 2 g in sodium chloride 0.9 % 100 mL IVPB (has no administration in time range)  vancomycin (VANCOCIN) IVPB 1000 mg/200 mL premix (has no administration in time range)    ED Course  I have reviewed the triage vital signs and the nursing notes.  Pertinent labs & imaging results that were available during my care of the patient were reviewed by me and considered in my medical decision making (see chart for details). Patient with hypotension and weakness in the left arm and left leg.  Patient is in the sepsis protocol and also have a CT head done for stroke rule out   CRITICAL CARE Performed by: Milton Ferguson Total critical care time: 35 minutes Critical care time was exclusive  of separately billable procedures and treating other patients. Critical care was necessary to treat or prevent imminent or life-threatening deterioration. Critical care  was time spent personally by me on the following activities: development of treatment plan with patient and/or surrogate as well as nursing, discussions with consultants, evaluation of patient's response to treatment, examination of patient, obtaining history from patient or surrogate, ordering and performing treatments and interventions, ordering and review of laboratory studies, ordering and review of radiographic studies, pulse oximetry and re-evaluation of patient's condition.  MDM Rules/Calculators/A&P                          Patient with hypotension and possible sepsis or stroke.   Document. Final Clinical Impression(s) / ED Diagnoses Final diagnoses:  None    Rx / DC Orders ED Discharge Orders    None       Milton Ferguson, MD 04/08/20 1528

## 2020-04-06 NOTE — ED Notes (Signed)
Collected lactic acid with blood culture @ this time

## 2020-04-06 NOTE — Sepsis Progress Note (Signed)
Notified bedside nurse of need to draw blood cultures and check on the result of the initial Lactate.

## 2020-04-06 NOTE — Progress Notes (Addendum)
Pharmacy Antibiotic Note  [name] is a 86 y.o. female admitted on 04/06/2020 with infection of unknown source.  Pharmacy has been consulted for vancomycin  and aztreonam  dosing. Patient received one dose of aztreonam in ED, but she has tolerated several courses of cephalosporins in the past, so aztreonam consult was discontinued and cefepime was initiated.  Plan:  Start cefepime 2g IV q24h Give vancomycin 2g  IV x1 dose-->will dose by levels based on fluctuating renal fx Goal vancomycin trough range:  15-20 mcg (renal fx too unstable for AUC dosing) Pharmacy will continue to monitor renal function, vancomycin troughs as clinically appropriate,  cultures and patient progress.  Height: 5\' 4"  (162.6 cm) Weight: 92 kg (202 lb 13.2 oz) IBW/kg (Calculated) : 54.7  Temp (24hrs), Avg:97.6 F (36.4 C), Min:97.6 F (36.4 C), Max:97.6 F (36.4 C)  Recent Labs  Lab 04/06/20 1430  CREATININE 1.54*    Estimated Creatinine Clearance: 24.5 mL/min (A) (by C-G formula based on SCr of 1.54 mg/dL (H)).    Allergies  Allergen Reactions  . Advicor [Niacin-Lovastatin Er] Other (See Comments)    Reaction unknown to daughter  . Caduet [Amlodipine-Atorvastatin] Other (See Comments)    Reaction unknown to daughter  . Codeine Other (See Comments)    Reaction unknown to daughter  . Dilantin [Phenytoin] Other (See Comments)    Reaction unknown to daughter  . Lescol [Fluvastatin] Other (See Comments)    Reaction unknown to daughter  . Pamelor [Nortriptyline] Other (See Comments)    Reaction unknown to daughter  . Penicillins Hives    Did it involve swelling of the face/tongue/throat, SOB, or low BP? Unk Did it involve sudden or severe rash/hives, skin peeling, or any reaction on the inside of your mouth or nose? Unk Did you need to seek medical attention at a hospital or doctor's office? Unk When did it last happen? "it was a long time ago" If all above answers are "NO", may proceed with  cephalosporin use.   . Sulfa Antibiotics Hives  . Trileptal [Oxcarbazepine] Other (See Comments)    Reaction unknown to daughter  . Ultram [Tramadol] Other (See Comments)    Reaction unknown to daughter  . Welchol [Colesevelam] Other (See Comments)    Reaction unknown to daughter  . Latex Rash  . Tomato Rash    Antimicrobials this admission: vancomycin 12/22 >>  cefepime 12/22  >>  aztreonam 12/22>> x1 dose in ED  Dose adjustments this admission: cefepime, vancomycin  Microbiology results: 12/22 [facility] x2:  pending 12/22 UCx: pending  12/22 Resp PCR: SARS CoV-2:       Flu A/B:  12/22  12/22  MRSA PCR:    Thank you for allowing pharmacy to be a part of this patient's care.  Despina Pole 04/06/2020 3:45 PM

## 2020-04-06 NOTE — ED Notes (Signed)
Report attempted. Nurse to call me back in 30 minutes.

## 2020-04-06 NOTE — Telephone Encounter (Signed)
Daughter aware that culture is still pending, should be back tomorrow and we will call as soon as we get it

## 2020-04-06 NOTE — ED Provider Notes (Signed)
3:30 PM-checkout from Dr. Roderic Palau to evaluate patient for possible sepsis versus stroke, causing presenting complaints including left-sided weakness, difficulty getting up, and low oxygen on arrival.  Reported onset of symptoms yesterday.  Telephone call with the patient's daughter [name], at 7:05 PM.  She states that the patient is getting weaker. Decreased intake of fluids, and food for several days.. Will sip water, if she has been encouraged. Patient calling daughter frequently over night complaining of weakness, for several days. She lives alone.  Patient has not had a Covid vaccine.  No known sick contacts.  MDM: Elderly female, presenting with malaise and hypotension.  Blood pressure improved with fluids, and she became hypertensive.  She is elderly chronically ill patient who appears to be having some progressive behavioral changes recently.  She appears to have incidental Covid infection, which is likely worsened her clinical status.  She has a history of numerous medical problems.  She is a high risk for decompensation, due to a viral infection, Covid, even nonvaccinated state.  Screening laboratory indicative of acute on chronic renal failure with AKI.  Initial troponin somewhat elevated, likely secondary to renal status.  Hemoglobin is at baseline.   7:14 PM-Consult complete with Hospitalist. Patient case explained and discussed. He agrees to admit patient for further evaluation and treatment. Call ended at 7:35 PM    Daleen Bo, MD 04/06/20 [phone]

## 2020-04-06 NOTE — ED Notes (Addendum)
Unable to obtain blood cultures due to difficultly with IV/access. BP low with map <65, fluids and abx began as ordered per MD

## 2020-04-06 NOTE — ED Notes (Signed)
Transport to CT

## 2020-04-06 NOTE — ED Notes (Signed)
Date and time results received: 04/06/20 6:36 PM   Test: COVID Critical Value: positive   Name of Provider Notified: Elicia Lamp RN  Orders Received? Or Actions Taken?: MD notified

## 2020-04-06 NOTE — ED Notes (Signed)
Daughter, [name], updated on status per pt request

## 2020-04-06 NOTE — ED Notes (Signed)
Date and time results received: 04/06/20 0820   Test: Lactic Critical Value: 2.1  Name of Provider Notified: Olevia Bowens   Orders Received? Or Actions Taken?: NA

## 2020-04-07 ENCOUNTER — Other Ambulatory Visit (HOSPITAL_COMMUNITY): Payer: Medicare PPO

## 2020-04-07 ENCOUNTER — Observation Stay (HOSPITAL_COMMUNITY): Payer: Medicare PPO

## 2020-04-07 ENCOUNTER — Encounter (HOSPITAL_COMMUNITY): Payer: Self-pay | Admitting: Internal Medicine

## 2020-04-07 DIAGNOSIS — M545 Low back pain, unspecified: Secondary | ICD-10-CM | POA: Diagnosis present

## 2020-04-07 DIAGNOSIS — N1832 Chronic kidney disease, stage 3b: Secondary | ICD-10-CM | POA: Diagnosis present

## 2020-04-07 DIAGNOSIS — Z79899 Other long term (current) drug therapy: Secondary | ICD-10-CM | POA: Diagnosis not present

## 2020-04-07 DIAGNOSIS — M109 Gout, unspecified: Secondary | ICD-10-CM | POA: Diagnosis present

## 2020-04-07 DIAGNOSIS — B370 Candidal stomatitis: Secondary | ICD-10-CM | POA: Diagnosis not present

## 2020-04-07 DIAGNOSIS — Z7982 Long term (current) use of aspirin: Secondary | ICD-10-CM | POA: Diagnosis not present

## 2020-04-07 DIAGNOSIS — Z6834 Body mass index (BMI) 34.0-34.9, adult: Secondary | ICD-10-CM | POA: Diagnosis not present

## 2020-04-07 DIAGNOSIS — D509 Iron deficiency anemia, unspecified: Secondary | ICD-10-CM | POA: Diagnosis present

## 2020-04-07 DIAGNOSIS — I361 Nonrheumatic tricuspid (valve) insufficiency: Secondary | ICD-10-CM | POA: Diagnosis not present

## 2020-04-07 DIAGNOSIS — U071 COVID-19: Secondary | ICD-10-CM | POA: Diagnosis present

## 2020-04-07 DIAGNOSIS — I129 Hypertensive chronic kidney disease with stage 1 through stage 4 chronic kidney disease, or unspecified chronic kidney disease: Secondary | ICD-10-CM | POA: Diagnosis present

## 2020-04-07 DIAGNOSIS — E871 Hypo-osmolality and hyponatremia: Secondary | ICD-10-CM | POA: Diagnosis present

## 2020-04-07 DIAGNOSIS — I5189 Other ill-defined heart diseases: Secondary | ICD-10-CM

## 2020-04-07 DIAGNOSIS — G8929 Other chronic pain: Secondary | ICD-10-CM | POA: Diagnosis present

## 2020-04-07 DIAGNOSIS — E875 Hyperkalemia: Secondary | ICD-10-CM | POA: Diagnosis present

## 2020-04-07 DIAGNOSIS — Z7951 Long term (current) use of inhaled steroids: Secondary | ICD-10-CM | POA: Diagnosis not present

## 2020-04-07 DIAGNOSIS — J1282 Pneumonia due to coronavirus disease 2019: Secondary | ICD-10-CM | POA: Diagnosis present

## 2020-04-07 DIAGNOSIS — Z8673 Personal history of transient ischemic attack (TIA), and cerebral infarction without residual deficits: Secondary | ICD-10-CM | POA: Diagnosis not present

## 2020-04-07 DIAGNOSIS — I10 Essential (primary) hypertension: Secondary | ICD-10-CM | POA: Diagnosis not present

## 2020-04-07 DIAGNOSIS — I480 Paroxysmal atrial fibrillation: Secondary | ICD-10-CM | POA: Diagnosis present

## 2020-04-07 DIAGNOSIS — I959 Hypotension, unspecified: Secondary | ICD-10-CM | POA: Diagnosis present

## 2020-04-07 DIAGNOSIS — R531 Weakness: Secondary | ICD-10-CM | POA: Diagnosis not present

## 2020-04-07 DIAGNOSIS — N179 Acute kidney failure, unspecified: Secondary | ICD-10-CM | POA: Diagnosis present

## 2020-04-07 DIAGNOSIS — E669 Obesity, unspecified: Secondary | ICD-10-CM | POA: Diagnosis present

## 2020-04-07 DIAGNOSIS — R0602 Shortness of breath: Secondary | ICD-10-CM | POA: Diagnosis present

## 2020-04-07 DIAGNOSIS — G40909 Epilepsy, unspecified, not intractable, without status epilepticus: Secondary | ICD-10-CM | POA: Diagnosis present

## 2020-04-07 DIAGNOSIS — I6523 Occlusion and stenosis of bilateral carotid arteries: Secondary | ICD-10-CM | POA: Diagnosis not present

## 2020-04-07 DIAGNOSIS — I340 Nonrheumatic mitral (valve) insufficiency: Secondary | ICD-10-CM | POA: Diagnosis not present

## 2020-04-07 DIAGNOSIS — Z8701 Personal history of pneumonia (recurrent): Secondary | ICD-10-CM | POA: Diagnosis not present

## 2020-04-07 DIAGNOSIS — K449 Diaphragmatic hernia without obstruction or gangrene: Secondary | ICD-10-CM | POA: Diagnosis present

## 2020-04-07 DIAGNOSIS — R29818 Other symptoms and signs involving the nervous system: Secondary | ICD-10-CM | POA: Diagnosis not present

## 2020-04-07 DIAGNOSIS — J45909 Unspecified asthma, uncomplicated: Secondary | ICD-10-CM | POA: Diagnosis present

## 2020-04-07 HISTORY — DX: Other ill-defined heart diseases: I51.89

## 2020-04-07 LAB — COMPREHENSIVE METABOLIC PANEL
ALT: 11 U/L (ref 0–44)
AST: 20 U/L (ref 15–41)
Albumin: 2.9 g/dL — ABNORMAL LOW (ref 3.5–5.0)
Alkaline Phosphatase: 52 U/L (ref 38–126)
Anion gap: 14 (ref 5–15)
BUN: 21 mg/dL (ref 8–23)
CO2: 18 mmol/L — ABNORMAL LOW (ref 22–32)
Calcium: 9.4 mg/dL (ref 8.9–10.3)
Chloride: 103 mmol/L (ref 98–111)
Creatinine, Ser: 1.39 mg/dL — ABNORMAL HIGH (ref 0.44–1.00)
GFR, Estimated: 35 mL/min — ABNORMAL LOW (ref >60)
Glucose, Bld: 110 mg/dL — ABNORMAL HIGH (ref 70–99)
Potassium: 4.7 mmol/L (ref 3.5–5.1)
Sodium: 135 mmol/L (ref 135–145)
Total Bilirubin: 0.5 mg/dL (ref 0.3–1.2)
Total Protein: 7 g/dL (ref 6.5–8.1)

## 2020-04-07 LAB — CBC WITH DIFFERENTIAL/PLATELET
Abs Immature Granulocytes: 0.42 10*3/uL — ABNORMAL HIGH (ref 0.00–0.07)
Basophils Absolute: 0.1 10*3/uL (ref 0.0–0.1)
Basophils Relative: 1 %
Eosinophils Absolute: 0 10*3/uL (ref 0.0–0.5)
Eosinophils Relative: 0 %
HCT: 32.9 % — ABNORMAL LOW (ref 36.0–46.0)
Hemoglobin: 9.9 g/dL — ABNORMAL LOW (ref 12.0–15.0)
Immature Granulocytes: 3 %
Lymphocytes Relative: 11 %
Lymphs Abs: 1.4 10*3/uL (ref 0.7–4.0)
MCH: 24.3 pg — ABNORMAL LOW (ref 26.0–34.0)
MCHC: 30.1 g/dL (ref 30.0–36.0)
MCV: 80.8 fL (ref 80.0–100.0)
Monocytes Absolute: 0.1 10*3/uL (ref 0.1–1.0)
Monocytes Relative: 1 %
Neutro Abs: 10.3 10*3/uL — ABNORMAL HIGH (ref 1.7–7.7)
Neutrophils Relative %: 84 %
Platelets: 404 10*3/uL — ABNORMAL HIGH (ref 150–400)
RBC: 4.07 MIL/uL (ref 3.87–5.11)
RDW: 23.9 % — ABNORMAL HIGH (ref 11.5–15.5)
WBC: 12.3 10*3/uL — ABNORMAL HIGH (ref 4.0–10.5)
nRBC: 2.5 % — ABNORMAL HIGH (ref 0.0–0.2)

## 2020-04-07 LAB — C-REACTIVE PROTEIN: CRP: 8 mg/dL — ABNORMAL HIGH (ref <1.0)

## 2020-04-07 LAB — MAGNESIUM: Magnesium: 2.5 mg/dL — ABNORMAL HIGH (ref 1.7–2.4)

## 2020-04-07 LAB — LACTIC ACID, PLASMA: Lactic Acid, Venous: 1.2 mmol/L (ref 0.5–1.9)

## 2020-04-07 LAB — HEMOGLOBIN A1C
Hgb A1c MFr Bld: 5.2 % (ref 4.8–5.6)
Mean Plasma Glucose: 102.54 mg/dL

## 2020-04-07 LAB — LIPID PANEL
Cholesterol: 120 mg/dL (ref 0–200)
HDL: 33 mg/dL — ABNORMAL LOW (ref >40)
LDL Cholesterol: 72 mg/dL (ref 0–99)
Total CHOL/HDL Ratio: 3.6 RATIO
Triglycerides: 73 mg/dL (ref <150)
VLDL: 15 mg/dL (ref 0–40)

## 2020-04-07 LAB — FERRITIN: Ferritin: 42 ng/mL (ref 11–307)

## 2020-04-07 LAB — PHOSPHORUS: Phosphorus: 4.1 mg/dL (ref 2.5–4.6)

## 2020-04-07 LAB — D-DIMER, QUANTITATIVE: D-Dimer, Quant: 3.37 ug/mL-FEU — ABNORMAL HIGH (ref 0.00–0.50)

## 2020-04-07 LAB — PROCALCITONIN: Procalcitonin: <0.1 ng/mL

## 2020-04-07 MED ORDER — STROKE: EARLY STAGES OF RECOVERY BOOK: Freq: Once | Status: DC

## 2020-04-07 MED ORDER — ALBUTEROL SULFATE HFA 108 (90 BASE) MCG/ACT IN AERS
2.0000 | INHALATION_SPRAY | Freq: Two times a day (BID) | RESPIRATORY_TRACT | Status: DC
  Administered 2020-04-07 – 2020-04-14 (×14): 2 via RESPIRATORY_TRACT

## 2020-04-07 MED ORDER — ALBUTEROL SULFATE HFA 108 (90 BASE) MCG/ACT IN AERS
2.0000 | INHALATION_SPRAY | Freq: Three times a day (TID) | RESPIRATORY_TRACT | Status: DC
  Administered 2020-04-07: 09:00:00 2 via RESPIRATORY_TRACT

## 2020-04-07 MED ORDER — ALPRAZOLAM 0.25 MG PO TABS
0.2500 mg | ORAL_TABLET | Freq: Once | ORAL | Status: AC
  Administered 2020-04-08: 0.25 mg via ORAL
  Filled 2020-04-07: qty 1

## 2020-04-07 NOTE — Plan of Care (Signed)
  Problem: Acute Rehab PT Goals(only PT should resolve) Goal: Pt Will Go Supine/Side To Sit Outcome: Progressing Flowsheets (Taken 04/07/2020 1038) Pt will go Supine/Side to Sit: with modified independence Goal: Patient Will Transfer Sit To/From Stand Outcome: Progressing Flowsheets (Taken 04/07/2020 1038) Patient will transfer sit to/from stand: with supervision Goal: Pt Will Transfer Bed To Chair/Chair To Bed Outcome: Progressing Flowsheets (Taken 04/07/2020 1038) Pt will Transfer Bed to Chair/Chair to Bed: with supervision Goal: Pt Will Ambulate Outcome: Progressing Flowsheets (Taken 04/07/2020 1038) Pt will Ambulate:  50 feet  with supervision  with min guard assist  with rolling walker   10:38 AM, 04/07/20 Lonell Grandchild, MPT Physical Therapist with [facility] [phone] office [phone] mobile phone

## 2020-04-07 NOTE — Evaluation (Signed)
Physical Therapy Evaluation Patient Details Name: [name] MRN: [id number] DOB: Apr 03, 1924 Today's Date: 04/07/2020   History of Present Illness  [name] is a 86 y.o. female with medical history significant of chronic lower back pain, degenerative arthritis, eczema, edema, gout, headache, hiatal hernia, hypertension, internal hemorrhoids, neurogenic claudication, class II obesity, seizure disorder, history of other nonhemorrhagic stroke, urticaria who is coming to the emergency department after her relatives noticed she was having left-sided weakness.  Her daughter told Dr. Eulis Foster that she has not been eating or drinking much recently.  She has also been complaining of generalized weakness to her daughter for the past several days.  She denies fever, but complains of chills, fatigue, dyspnea and nonproductive cough.  No abdominal pain, nausea or vomiting, diarrhea, constipation, melena or hematochezia.  No dysuria, frequency or hematuria.  No polyuria, polydipsia, polyphagia or blurred vision.  She is unvaccinated against coronavirus.    Clinical Impression  Patient functioning near baseline for functional mobility and gait, requires Min assist for completing sit to stands from bedside and commode in bathroom, demonstrates slow labored cadence with flexed trunk due to generalized weakness, no loss of balance and limited mostly due to fatigue.  No significant difference noted in BLE stregnth and patient tolerated sitting up in wheelchair to be taken to procedure by nursing staff after therapy.  Patient will benefit from continued physical therapy in hospital and recommended venue below to increase strength, balance, endurance for safe ADLs and gait.     Follow Up Recommendations SNF;Supervision for mobility/OOB;Supervision - Intermittent    Equipment Recommendations  None recommended by PT    Recommendations for Other Services       Precautions / Restrictions  Precautions Precautions: Fall Restrictions Weight Bearing Restrictions: No      Mobility  Bed Mobility Overal bed mobility: Needs Assistance Bed Mobility: Supine to Sit     Supine to sit: Supervision     General bed mobility comments: increased time labored movement, had to use bed rail to pull self to sitting    Transfers Overall transfer level: Needs assistance Equipment used: Rolling walker (2 wheeled) Transfers: Sit to/from [facility] Sit to Stand: Min assist Stand pivot transfers: Min assist;Min guard       General transfer comment: demonstrates good return for transferrring to commode in bathroom, had to use grab bar  Ambulation/Gait Ambulation/Gait assistance: Min guard;Min assist Gait Distance (Feet): 18 Feet Assistive device: Rolling walker (2 wheeled) Gait Pattern/deviations: Decreased step length - right;Decreased step length - left;Decreased stride length;Trunk flexed Gait velocity: decreased   General Gait Details: slow labored cadence without loss of balance, had to lean over walker due to generalized weakness, limited mostly due to fatigue  Stairs            Wheelchair Mobility    Modified Rankin (Stroke Patients Only)       Balance Overall balance assessment: Needs assistance Sitting-balance support: Feet supported;No upper extremity supported Sitting balance-Leahy Scale: Good Sitting balance - Comments: seated at EOB   Standing balance support: During functional activity;Bilateral upper extremity supported Standing balance-Leahy Scale: Fair Standing balance comment: using RW                             Pertinent Vitals/Pain Pain Assessment: No/denies pain    Home Living Family/patient expects to be discharged to:: Private residence Living Arrangements: Alone Available Help at Discharge: Family;Available PRN/intermittently Type of  Home: House Home Access: Stairs to enter Entrance Stairs-Rails:  Right Entrance Stairs-Number of Steps: 5-6 Home Layout: One level Home Equipment: Bedside commode;Walker - 2 wheels;Walker - 4 wheels;Cane - single point;Shower seat      Prior Function Level of Independence: Needs assistance   Gait / Transfers Assistance Needed: Household ambulator using SPC, occasional RW for longer distances  ADL's / Homemaking Assistance Needed: has caregivers 5-6 hours daily, family and neighbors check on her daily        Hand Dominance   Dominant Hand: Right    Extremity/Trunk Assessment   Upper Extremity Assessment Upper Extremity Assessment: Defer to OT evaluation    Lower Extremity Assessment Lower Extremity Assessment: Generalized weakness    Cervical / Trunk Assessment Cervical / Trunk Assessment: Kyphotic  Communication   Communication: No difficulties  Cognition Arousal/Alertness: Awake/alert Behavior During Therapy: WFL for tasks assessed/performed Overall Cognitive Status: Within Functional Limits for tasks assessed                                        General Comments      Exercises     Assessment/Plan    PT Assessment Patient needs continued PT services  PT Problem List Decreased strength;Decreased activity tolerance;Decreased balance;Decreased mobility       PT Treatment Interventions DME instruction;Gait training;Stair training;Balance training;Functional mobility training;Therapeutic activities;Therapeutic exercise;Patient/family education    PT Goals (Current goals can be found in the Care Plan section)  Acute Rehab PT Goals Patient Stated Goal: return home with family, caregivers, neighbors to assist PT Goal Formulation: With patient Time For Goal Achievement: 04/21/20 Potential to Achieve Goals: Good    Frequency Min 3X/week   Barriers to discharge        Co-evaluation PT/OT/SLP Co-Evaluation/Treatment: Yes Reason for Co-Treatment: Complexity of the patient's impairments (multi-system  involvement);For patient/therapist safety PT goals addressed during session: Mobility/safety with mobility;Proper use of DME;Balance         AM-PAC PT "6 Clicks" Mobility  Outcome Measure Help needed turning from your back to your side while in a flat bed without using bedrails?: None Help needed moving from lying on your back to sitting on the side of a flat bed without using bedrails?: A Little Help needed moving to and from a bed to a chair (including a wheelchair)?: A Little Help needed standing up from a chair using your arms (e.g., wheelchair or bedside chair)?: A Little Help needed to walk in hospital room?: A Little Help needed climbing 3-5 steps with a railing? : A Lot 6 Click Score: 18    End of Session   Activity Tolerance: Patient tolerated treatment well;Patient limited by fatigue Patient left: in chair;with call bell/phone within reach;Other (comment) (patient taken for procedure by nursing staff after therapy) Nurse Communication: Mobility status PT Visit Diagnosis: Unsteadiness on feet (R26.81);Other abnormalities of gait and mobility (R26.89);Muscle weakness (generalized) (M62.81)    Time: 6834-1962 PT Time Calculation (min) (ACUTE ONLY): 32 min   Charges:   PT Evaluation $PT Eval Moderate Complexity: 1 Mod PT Treatments $Therapeutic Activity: 23-37 mins        10:36 AM, 04/07/20 Lonell Grandchild, MPT Physical Therapist with [facility] [phone] office [phone] mobile phone

## 2020-04-07 NOTE — Plan of Care (Signed)

## 2020-04-07 NOTE — TOC Initial Note (Signed)
Transition of Care [facility]) - Initial/Assessment Note    Patient Details  Name: [name] MRN: [id number] Date of Birth: [birth year]  Transition of Care [facility]) CM/SW Contact:    Ihor Gully, LCSW Phone Number: 04/07/2020, 4:30 PM  Clinical Narrative:                 Patient from home alone. Admitted COVID+. Patient has been in SNF twice recently. Discussed PT recommendation of SNF with daughter, [name]. [name] advised that she would speak with her brother and return contact with TOC.  Expected Discharge Plan: [place] Barriers to Discharge: Continued Medical Work up   Patient Goals and CMS Choice Patient states their goals for this hospitalization and ongoing recovery are:: family is considering options   Choice offered to / list presented to : Adult Children  Expected Discharge Plan and Services Expected Discharge Plan: [place]       Living arrangements for the past 2 months: Single Family Home                                      Prior Living Arrangements/Services Living arrangements for the past 2 months: Single Family Home Lives with:: Self Patient language and need for interpreter reviewed:: Yes        Need for Family Participation in Patient Care: Yes (Comment) Care giver support system in place?: Yes (comment) Current home services: Home PT Criminal Activity/Legal Involvement Pertinent to Current Situation/Hospitalization: No - Comment as needed  Activities of Daily Living Home Assistive Devices/Equipment: Cane (specify quad or straight) ADL Screening (condition at time of admission) Patient's cognitive ability adequate to safely complete daily activities?: Yes Is the patient deaf or have difficulty hearing?: No Does the patient have difficulty seeing, even when wearing glasses/contacts?: No Does the patient have difficulty concentrating, remembering, or making decisions?: Yes Patient able to express need  for assistance with ADLs?: Yes Does the patient have difficulty dressing or bathing?: Yes Independently performs ADLs?: Yes (appropriate for developmental age) Does the patient have difficulty walking or climbing stairs?: Yes Weakness of Legs: Both Weakness of Arms/Hands: Both  Permission Sought/Granted Permission sought to share information with : Family Supports    Share Information with NAME: Warren Kugelman     Permission granted to share info w Relationship: daughter     Emotional Assessment Appearance:: Appears stated age   Affect (typically observed): Appropriate   Alcohol / Substance Use: Not Applicable Psych Involvement: No (comment)  Admission diagnosis:  Shortness of breath [R06.02] Weakness [R53.1] 2019 novel coronavirus disease (COVID-19) [U07.1] COVID-19 virus infection [U07.1] Pneumonia due to COVID-19 virus [U07.1, J12.82] Patient Active Problem List   Diagnosis Date Noted  . Left-sided weakness 04/07/2020  . Grade I diastolic dysfunction 65/46/5035  . Pneumonia due to COVID-19 virus 04/07/2020  . 2019 novel coronavirus disease (COVID-19) 04/06/2020  . Prolonged QT interval 04/06/2020  . Elevated troponin 04/06/2020  . Microcytic anemia 04/06/2020  . Hyperkalemia 04/06/2020  . Hyponatremia 04/06/2020  . Palliative care by specialist   . Goals of care, counseling/discussion   . SVT (supraventricular tachycardia) ([place]) 02/04/2020  . PNA (pneumonia) 02/03/2020  . Atrial fibrillation with RVR ([place]) 01/10/2020  . Paroxysmal atrial fibrillation ([place]) 01/09/2020  . Pneumonia 01/04/2020  . Class 2 obesity 01/04/2020  . CKD (chronic kidney disease) stage 3, GFR 30-59 ml/min (HCC) 01/04/2020  .  Nausea vomiting and diarrhea 03/12/2019  . Lung nodules 03/12/2019  . Atypical chest pain 12/31/2018  . Asthma 02/12/2017  . Chest pain 11/13/2016  . History of seizures 11/13/2016  . History of stroke 11/13/2016  . Allergic urticaria 08/30/2016  . GAD (generalized  anxiety disorder) 08/30/2016  . Gout 08/30/2016  . Acute renal failure superimposed on stage 3 chronic kidney disease ([place]) 10/11/2015  . Neurogenic claudication ([place])   . Morbid obesity ([place])   . Edema   . Essential hypertension, benign 09/30/2014  . Secondary hyperparathyroidism, renal ([place]) 09/30/2014  . Proteinuria 09/30/2014  . Anemia associated with chronic renal failure 09/30/2014  . Chronic kidney disease (CKD), stage IV (severe) ([place]) 09/30/2014  . Hiatal hernia 08/21/2012  . Seizure disorder ([place]) 08/21/2012   PCP:  [name], [name] Pharmacy:   [id number] - [place], [place] [address] Phone: [phone] Fax: [phone]     Social Determinants of Health (SDOH) Interventions    Readmission Risk Interventions Readmission Risk Prevention Plan 02/08/2020 01/07/2020  Transportation Screening Complete Complete  PCP or Specialist Appt within 5-7 Days - Complete  Home Care Screening - Complete  Medication Review (RN CM) - Complete  HRI or Home Care Consult Complete -  Social Work Consult for Recovery Care Planning/Counseling Complete -  Palliative Care Screening Complete -  Medication Review Press photographer) Complete -  Some recent data might be hidden

## 2020-04-07 NOTE — Evaluation (Signed)
Occupational Therapy Evaluation Patient Details Name: [name] MRN: [id number] DOB: 1924-02-13 Today's Date: 04/07/2020    History of Present Illness [name] is a 86 y.o. female with medical history significant of chronic lower back pain, degenerative arthritis, eczema, edema, gout, headache, hiatal hernia, hypertension, internal hemorrhoids, neurogenic claudication, class II obesity, seizure disorder, history of other nonhemorrhagic stroke, urticaria who is coming to the emergency department after her relatives noticed she was having left-sided weakness.  Her daughter told Dr. Eulis Foster that she has not been eating or drinking much recently.  She has also been complaining of generalized weakness to her daughter for the past several days.  She denies fever, but complains of chills, fatigue, dyspnea and nonproductive cough.  No abdominal pain, nausea or vomiting, diarrhea, constipation, melena or hematochezia.  No dysuria, frequency or hematuria.  No polyuria, polydipsia, polyphagia or blurred vision.  She is unvaccinated against coronavirus.   Clinical Impression   Pt agreeable to OT/PT co-evaluation this am, pt alert and oriented x3 this am. Pt providing PLOF hx, reports lives alone but has caregivers daily, is alone at night. Pt performing tasks near suspected baseline today. Pt with LUE weakness, however per chart review this appears to be chronic, pt also reporting "it has been this way for a while." Pt requiring min guard/min assist for functional mobility and ADL tasks today. Pt maintaining O2 above 93 throughout session. Recommend SNF on discharge to improve pt safety and independence in ADLs and to improve activity tolerance. Pt is adamant that she will not go to SNF again, if pt continues to refuse and goes home she will need [facility] OT services to evaluate functioning in her home environment. No further acute OT services required at this time.     Follow Up Recommendations  SNF     Equipment Recommendations  None recommended by OT       Precautions / Restrictions Precautions Precautions: Fall Restrictions Weight Bearing Restrictions: No      Mobility Bed Mobility Overal bed mobility: Needs Assistance Bed Mobility: Supine to Sit     Supine to sit: Supervision     General bed mobility comments: increased time labored movement, had to use bed rail to pull self to sitting    Transfers Overall transfer level: Needs assistance Equipment used: Rolling walker (2 wheeled) Transfers: Sit to/from [facility] Sit to Stand: Min assist Stand pivot transfers: Min assist;Min guard       General transfer comment: demonstrates good return for transferrring to commode in bathroom, had to use grab bar        ADL either performed or assessed with clinical judgement   ADL Overall ADL's : Needs assistance/impaired     Grooming: Wash/dry hands;Set up;Sitting Grooming Details (indicate cue type and reason): pt provided with hand sanitizer while seated in chair after toileting         Upper Body Dressing : Minimal assistance;Moderate assistance;Sitting Upper Body Dressing Details (indicate cue type and reason): assist for left arm due to strength deficits. Assist for managing gown ties/buttons Lower Body Dressing: Moderate assistance;Maximal assistance;Sitting/lateral leans   Toilet Transfer: Minimal assistance;Ambulation;Regular Toilet;Grab bars;RW Armed forces technical officer Details (indicate cue type and reason): cuing for use of grab bars, min assist for safety Toileting- Clothing Manipulation and Hygiene: Minimal assistance;Sitting/lateral lean;Sit to/from stand Toileting - Clothing Manipulation Details (indicate cue type and reason): pt able to perform pericare while using RW and in a semi-squat position, assist for placing paper in commode  Functional mobility during ADLs: Minimal assistance;Rolling walker;Cueing for safety;Cueing for sequencing        Vision Baseline Vision/History: No visual deficits Patient Visual Report: No change from baseline Vision Assessment?: No apparent visual deficits            Pertinent Vitals/Pain Pain Assessment: No/denies pain     Hand Dominance Right   Extremity/Trunk Assessment Upper Extremity Assessment Upper Extremity Assessment: LUE deficits/detail LUE Deficits / Details: LUE strength 2+/5 shoulder, 4+/5 elbow and wrist, mild grip strength deficits. Unsure if left shoulder weakness is chronic or acute LUE Sensation: WNL LUE Coordination: WNL   Lower Extremity Assessment Lower Extremity Assessment: Defer to PT evaluation   Cervical / Trunk Assessment Cervical / Trunk Assessment: Kyphotic   Communication Communication Communication: No difficulties   Cognition Arousal/Alertness: Awake/alert Behavior During Therapy: WFL for tasks assessed/performed Overall Cognitive Status: Within Functional Limits for tasks assessed                                                Home Living Family/patient expects to be discharged to:: Private residence Living Arrangements: Alone Available Help at Discharge: Family;Available PRN/intermittently Type of Home: House Home Access: Stairs to enter [employer] of Steps: 5-6 Entrance Stairs-Rails: Right Home Layout: One level     Bathroom Shower/Tub: Teacher, early years/pre: Handicapped height Bathroom Accessibility: Yes   Home Equipment: Bedside commode;Walker - 2 wheels;Walker - 4 wheels;Cane - single point;Shower seat          Prior Functioning/Environment Level of Independence: Needs assistance  Gait / Transfers Assistance Needed: Household ambulator using SPC, occasional RW for longer distances ADL's / Homemaking Assistance Needed: has caregivers 5-6 hours daily, family and neighbors check on her daily. Pt reports she is alone at night            OT Problem List: Decreased  strength;Decreased activity tolerance;Impaired balance (sitting and/or standing);Decreased cognition;Decreased safety awareness;Decreased knowledge of use of DME or AE;Impaired UE functional use      OT Treatment/Interventions:      OT Goals(Current goals can be found in the care plan section) Acute Rehab OT Goals Patient Stated Goal: return home with family, caregivers, neighbors to assist  OT Frequency:             Co-evaluation PT/OT/SLP Co-Evaluation/Treatment: Yes Reason for Co-Treatment: Complexity of the patient's impairments (multi-system involvement) PT goals addressed during session: Mobility/safety with mobility;Proper use of DME;Balance OT goals addressed during session: ADL's and self-care;Proper use of Adaptive equipment and DME      AM-PAC OT "6 Clicks" Daily Activity     Outcome Measure Help from another person eating meals?: A Little Help from another person taking care of personal grooming?: A Little Help from another person toileting, which includes using toliet, bedpan, or urinal?: A Little Help from another person bathing (including washing, rinsing, drying)?: A Lot Help from another person to put on and taking off regular upper body clothing?: A Little Help from another person to put on and taking off regular lower body clothing?: A Lot 6 Click Score: 16   End of Session Equipment Utilized During Treatment: Rolling walker  Activity Tolerance: Patient tolerated treatment well Patient left: in chair;with call bell/phone within reach;with chair alarm set (MRI arrived for transport)  OT Visit Diagnosis: Muscle weakness (generalized) (M62.81)  Time: 4196-2229 OT Time Calculation (min): 41 min Charges:  OT General Charges $OT Visit: 1 Visit OT Evaluation $OT Eval Low Complexity: [place], OTR/L  [phone] 04/07/2020, 11:21 AM

## 2020-04-07 NOTE — Progress Notes (Signed)
PROGRESS NOTE    [name]  HYW:[id number] DOB: 11-10-1923 DOA: 04/06/2020 PCP: [name], [name]   Brief Narrative:  [name] is a 86 y.o. female with medical history significant of chronic lower back pain, degenerative arthritis, eczema, edema, gout, headache, hiatal hernia, hypertension, internal hemorrhoids, neurogenic claudication, class II obesity, seizure disorder, history of other nonhemorrhagic stroke, urticaria who is coming to the emergency department after her relatives noticed she was having left-sided weakness.    Assessment & Plan:   Principal Problem:   2019 novel coronavirus disease (COVID-19) Active Problems:   Hiatal hernia   Seizure disorder (HCC)   Essential hypertension, benign   Acute renal failure superimposed on stage 3 chronic kidney disease (HCC)   Gout   Asthma   Class 2 obesity   Paroxysmal atrial fibrillation (HCC)   Prolonged QT interval   Elevated troponin   Microcytic anemia   Hyperkalemia   Hyponatremia   Left-sided weakness   Pneumonia due to COVID-19 virus   2019 novel coronavirus disease (COVID-19) Continue on RA I-S and flutter valve. Advised to prone as tolerated. Bronchodilators as needed. Acetaminophen as needed. Antitussive/antiemetic as needed Remdesivir per pharmacy for 5 days; outpatient infusion closed through 12/27 Continue dexamethasone 6 mg IVP daily Follow CBC, CMP and inflammatory markers.    Left-sided weakness-improving PT recommending SNF Hemoglobin A1c 5.2% LDL 72 Check carotid Doppler with minimal plaque Check echocardiogram pending MRI brain with no acute findings    Prolonged QT interval Magnesium was supplemented. Avoid QT interval prolonging meds Follow-up EKG in a.m.    Elevated troponin Check echocardiogram-pending    Acute renal failure superimposed on stage 3 chronic kidney disease-improving Hydrated with bolus earlier in the ED. Judicious IV hydration as  needed.. Follow-up creatinine level in a.m.    Hyperkalemia-resolved Received fluids earlier. Follow-up potassium level in a.m.    Hyponatremia-improving Decreased oral intake? Bilateral lower extremity edema. No cardiomegaly, but small pleural effusion. Follow monitor sodium level.    Hiatal hernia Untreated. Asymptomatic. Meds may prolong QT interval.    Seizure disorder (HCC) Continue Depakote 500 mg p.o. twice daily. Continue Keppra 2000 mg extended release at bedtime.    Essential hypertension, benign Not on antihypertensives. Was hypotensive earlier. Monitor blood pressure.    Gout Continue allopurinol 100 mg p.o. daily.    Asthma Continue Singulair 10 mg p.o. at bedtime. Supplemental oxygen as needed. Bronchodilators as needed.    Class 2 obesity Lifestyle modifications. Follow-up with PCP.    Microcytic anemia Monitor H&H.   DVT prophylaxis:Lovenox Code Status: Full Family Communication: Discussed with son on phone 12/23 Disposition Plan:  Status is: Inpatient  Remains inpatient appropriate because:IV treatments appropriate due to intensity of illness or inability to take PO and Inpatient level of care appropriate due to severity of illness   Dispo: The patient is from: Home              Anticipated d/c is to: Home              Anticipated d/c date is: > 3 days              Patient currently is not medically stable to d/c. IV medications for covid.  Consultants:   None  Procedures:   See below  Antimicrobials:  Anti-infectives (From admission, onward)   Start     Dose/Rate Route Frequency Ordered Stop   04/07/20 1000  remdesivir 100 mg in sodium chloride 0.9 % 100  mL IVPB        100 mg 200 mL/hr over 30 Minutes Intravenous Daily 04/06/20 1914 04/11/20 0959   04/07/20 0000  vancomycin variable dose per unstable renal function (pharmacist dosing)  Status:  Discontinued         Does not apply See admin instructions 04/06/20 1601  04/07/20 0807   04/06/20 2100  ceFEPIme (MAXIPIME) 2 g in sodium chloride 0.9 % 100 mL IVPB  Status:  Discontinued        2 g 200 mL/hr over 30 Minutes Intravenous Every 24 hours 04/06/20 1658 04/07/20 0807   04/06/20 1915  remdesivir 100 mg in sodium chloride 0.9 % 100 mL IVPB        100 mg 200 mL/hr over 30 Minutes Intravenous Every 30 min 04/06/20 1914 04/06/20 2220   04/06/20 1615  vancomycin (VANCOREADY) IVPB 2000 mg/400 mL        2,000 mg 200 mL/hr over 120 Minutes Intravenous  Once 04/06/20 1604 04/06/20 1838   04/06/20 1500  aztreonam (AZACTAM) 2 g in sodium chloride 0.9 % 100 mL IVPB        2 g 200 mL/hr over 30 Minutes Intravenous  Once 04/06/20 1459 04/06/20 1615   04/06/20 1500  vancomycin (VANCOCIN) IVPB 1000 mg/200 mL premix  Status:  Discontinued        1,000 mg 200 mL/hr over 60 Minutes Intravenous  Once 04/06/20 1459 04/06/20 1552       Subjective: Patient seen and evaluated today with no new acute complaints or concerns. No acute concerns or events noted overnight.  Objective: Vitals:   04/07/20 0328 04/07/20 0658 04/07/20 0834 04/07/20 1602  BP:  (!) 98/59  125/75  Pulse:  97  (!) 103  Resp:  18  18  Temp:  98.2 F (36.8 C)  97.6 F (36.4 C)  TempSrc:      SpO2: 91%  94% 96%  Weight:      Height:        Intake/Output Summary (Last 24 hours) at 04/07/2020 1706 Last data filed at 04/07/2020 0500 Gross per 24 hour  Intake 644.88 ml  Output 200 ml  Net 444.88 ml   Filed Weights   04/06/20 1510  Weight: 92 kg    Examination:  General exam: Appears calm and comfortable  Respiratory system: Clear to auscultation. Respiratory effort normal. Cardiovascular system: S1 & S2 heard, RRR.  Gastrointestinal system: Abdomen is soft Central nervous system: Alert and awake Extremities: No edema Skin: No significant lesions noted Psychiatry: Flat affect.    Data Reviewed: I have personally reviewed following labs and imaging studies  CBC: Recent Labs   Lab 04/06/20 1430 04/07/20 0503  WBC 10.7* 12.3*  NEUTROABS 8.8* 10.3*  HGB 9.6* 9.9*  HCT 31.1* 32.9*  MCV 79.7* 80.8  PLT 325 644*   Basic Metabolic Panel: Recent Labs  Lab 04/06/20 1430 04/06/20 1920 04/07/20 0503  NA 132*  --  135  K 5.2*  --  4.7  CL 102  --  103  CO2 20*  --  18*  GLUCOSE 100*  --  110*  BUN 19  --  21  CREATININE 1.54*  --  1.39*  CALCIUM 9.3  --  9.4  MG  --  2.1 2.5*  PHOS  --  3.4 4.1   GFR: Estimated Creatinine Clearance: 27.2 mL/min (A) (by C-G formula based on SCr of 1.39 mg/dL (H)). Liver Function Tests: Recent Labs  Lab 04/06/20 1430 04/07/20  0503  AST 36 20  ALT 8 11  ALKPHOS 48 52  BILITOT 0.8 0.5  PROT 6.7 7.0  ALBUMIN 2.7* 2.9*   No results for input(s): LIPASE, AMYLASE in the last 168 hours. No results for input(s): AMMONIA in the last 168 hours. Coagulation Profile: Recent Labs  Lab 04/06/20 1920  INR 1.1   Cardiac Enzymes: No results for input(s): CKTOTAL, CKMB, CKMBINDEX, TROPONINI in the last 168 hours. BNP (last 3 results) No results for input(s): PROBNP in the last 8760 hours. HbA1C: Recent Labs    04/07/20 0503  HGBA1C 5.2   CBG: No results for input(s): GLUCAP in the last 168 hours. Lipid Profile: Recent Labs    04/07/20 0503  CHOL 120  HDL 33*  LDLCALC 72  TRIG 73  CHOLHDL 3.6   Thyroid Function Tests: No results for input(s): TSH, T4TOTAL, FREET4, T3FREE, THYROIDAB in the last 72 hours. Anemia Panel: Recent Labs    04/06/20 1920 04/07/20 0503  FERRITIN 38 42   Sepsis Labs: Recent Labs  Lab 04/06/20 1747 04/06/20 1920 04/07/20 0503  PROCALCITON  --  <0.10 <0.10  LATICACIDVEN 1.4 2.1* 1.2    Recent Results (from the past 240 hour(s))  Urine Culture     Status: Abnormal (Preliminary result)   Collection Time: 04/04/20  4:49 PM   UR  Result Value Ref Range Status   Urine Culture, Routine Preliminary report (A)  Preliminary   Organism ID, Bacteria Gram negative rods (A)   Preliminary    Comment: Greater than 100,000 colony forming units per mL  Microscopic Examination     Status: None   Collection Time: 04/04/20  4:59 PM   Urine  Result Value Ref Range Status   WBC, UA None seen 0 - 5 /hpf Final   RBC None seen 0 - 2 /hpf Final   Epithelial Cells (non renal) CANCELED      Comment: Test not performed  Result canceled by the ancillary.    Bacteria, UA Few None seen/Few Final  Resp Panel by RT-PCR (Flu A&B, Covid) Nasopharyngeal Swab     Status: Abnormal   Collection Time: 04/06/20  2:44 PM   Specimen: Nasopharyngeal Swab; Nasopharyngeal(NP) swabs in vial transport medium  Result Value Ref Range Status   SARS Coronavirus 2 by RT PCR POSITIVE (A) NEGATIVE Final    Comment: RESULT CALLED TO, READ BACK BY AND VERIFIED WITH: HASTINGS,K [id number] ON 04/06/20 BY HUFFINES,S. (NOTE) SARS-CoV-2 target nucleic acids are DETECTED.  The SARS-CoV-2 RNA is generally detectable in upper respiratory specimens during the acute phase of infection. Positive results are indicative of the presence of the identified virus, but do not rule out bacterial infection or co-infection with other pathogens not detected by the test. Clinical correlation with patient history and other diagnostic information is necessary to determine patient infection status. The expected result is Negative.  Fact Sheet for Patients: EntrepreneurPulse.com.au  Fact Sheet for Healthcare Providers: IncredibleEmployment.be  This test is not yet approved or cleared by the [place] FDA and  has been authorized for detection and/or diagnosis of SARS-CoV-2 by FDA under an Emergency Use Authorization (EUA).  This EUA will remain in effect (meaning this  test can be used) for the duration of  the COVID-19 declaration under Section 564(b)(1) of the Act, 21 U.S.C. section 360bbb-3(b)(1), unless the authorization is terminated or revoked sooner.     Influenza A by  PCR NEGATIVE NEGATIVE Final   Influenza B by PCR NEGATIVE NEGATIVE Final  Comment: (NOTE) The Xpert Xpress SARS-CoV-2/FLU/RSV plus assay is intended as an aid in the diagnosis of influenza from Nasopharyngeal swab specimens and should not be used as a sole basis for treatment. Nasal washings and aspirates are unacceptable for Xpert Xpress SARS-CoV-2/FLU/RSV testing.  Fact Sheet for Patients: EntrepreneurPulse.com.au  Fact Sheet for Healthcare Providers: IncredibleEmployment.be  This test is not yet approved or cleared by the [place] FDA and has been authorized for detection and/or diagnosis of SARS-CoV-2 by FDA under an Emergency Use Authorization (EUA). This EUA will remain in effect (meaning this test can be used) for the duration of the COVID-19 declaration under Section 564(b)(1) of the Act, 21 U.S.C. section 360bbb-3(b)(1), unless the authorization is terminated or revoked.  Performed at [facility], [address], [place], [place] [address]   Blood Culture (routine x 2)     Status: None (Preliminary result)   Collection Time: 04/06/20  5:47 PM   Specimen: BLOOD RIGHT FOREARM  Result Value Ref Range Status   Specimen Description BLOOD RIGHT FOREARM  Final   Special Requests   Final    BOTTLES DRAWN AEROBIC ONLY Blood Culture results may not be optimal due to an inadequate volume of blood received in culture bottles   Culture   Final    NO GROWTH < 12 HOURS Performed at [facility], [address], [place], [place] [address]    Report Status PENDING  Incomplete  Blood Culture (routine x 2)     Status: None (Preliminary result)   Collection Time: 04/06/20  7:20 PM   Specimen: BLOOD  Result Value Ref Range Status   Specimen Description BLOOD  Final   Special Requests NONE  Final   Culture   Final    NO GROWTH < 12 HOURS Performed at [facility], [address], [place], [place] [address]    Report Status PENDING  Incomplete          Radiology Studies: CT Head Wo Contrast  Result Date: 04/06/2020 CLINICAL DATA:  Left-sided weakness. EXAM: CT HEAD WITHOUT CONTRAST TECHNIQUE: Contiguous axial images were obtained from the base of the skull through the vertex without intravenous contrast. COMPARISON:  CT head March 12, 2019 FINDINGS: Brain: No evidence of acute large vascular territory infarction, hemorrhage, hydrocephalus, extra-axial collection or mass lesion/mass effect. Patchy white matter hypoattenuation appears similar to prior and most likely represents the sequela of chronic microvascular ischemic disease. Similar area of calcification in the inferior left anterior temporal lobe. There is venous gas in the region of the basal venous plexus, right cavernous sinus, right infratemporal fossa, and parapharyngeal space, likely related t gas with IV access. Vascular: Calcific atherosclerosis. No hyperdense vessel identified. Skull: No acute fracture. Sinuses/Orbits: Sinuses are clear.  Unremarkable orbits. Other: No mastoid effusions. IMPRESSION: 1. No evidence of acute intracranial abnormality. 2. Chronic microvascular ischemic disease. Electronically Signed   By: Margaretha Sheffield MD   On: 04/06/2020 16:40   MR ANGIO HEAD WO CONTRAST  Result Date: 04/07/2020 CLINICAL DATA:  Neuro deficit, acute, stroke suspected EXAM: MRI HEAD WITHOUT CONTRAST MRA HEAD WITHOUT CONTRAST TECHNIQUE: Multiplanar, multiecho pulse sequences of the brain and surrounding structures were obtained without intravenous contrast. Angiographic images of the head were obtained using MRA technique without contrast. COMPARISON:  04/06/2020 and prior. FINDINGS: MRI HEAD FINDINGS Brain: No diffusion-weighted signal abnormality. No acute intracranial hemorrhage. No midline shift, ventriculomegaly or extra-axial fluid collection. 1.1 x 0.7 x 0.6 cm T2 hyperintense left temporal lesion with a T2  hypointense rim and associated SWI signal dropout likely  reflects a cavernoma (15:7, 19:18). Mild cerebral atrophy with ex vacuo dilatation. Moderate chronic microvascular ischemic changes. Vascular: Please see MRA. Skull and upper cervical spine: Normal marrow signal. Sinuses/Orbits: Sequela of bilateral lens replacement. Pneumatized paranasal sinuses and mastoid air cells. Other: Please note that image quality is degraded by motion artifact. MRA HEAD FINDINGS Anterior circulation: Irregularity of the distal cervical and petrous ICA segments with mild narrowing secondary to atheromatous disease. Patent cavernous segments with mild distal narrowing on the right. Mild bilateral supraclinoid ICA narrowing. 1-2 mm left supraclinoid ICA outpouching (10:137), infundibulum versus small aneurysm. Patent ACAs. ACA irregularity may be secondary to atheromatous disease versus motion artifact. Mild to moderate bilateral A2 segment narrowing. Patent left MCA. Multifocal mild-to-moderate narrowing involving the right M1 and M2 segments. Multifocal mild left M2 segment narrowing. Posterior circulation: Dominant left vertebral artery. Right vertebral artery terminates as PICA. Patent basilar artery. Mild proximal left superior cerebellar artery narrowing. Patent bilateral PCAs. Mild left P2 segment narrowing. Venous sinuses: No evidence of thrombosis. Anatomic variants: None of significance. IMPRESSION: MRI head: No acute intracranial process. Moderate chronic microvascular ischemic changes. 1.1 cm left temporal cavernoma. MRA head: Intracranial atheromatous disease with mild narrowing of the bilateral ICAs and proximal left superior cerebellar artery. 1-2 mm left supraclinoid ICA aneurysm versus infundibulum. Multifocal mild-to-moderate right M1/M2, left M2, bilateral A2 and left P2 segment narrowing. Electronically Signed   By: Primitivo Gauze M.D.   On: 04/07/2020 09:46   MR BRAIN WO CONTRAST  Result Date: 04/07/2020 CLINICAL DATA:  Neuro deficit, acute, stroke suspected  EXAM: MRI HEAD WITHOUT CONTRAST MRA HEAD WITHOUT CONTRAST TECHNIQUE: Multiplanar, multiecho pulse sequences of the brain and surrounding structures were obtained without intravenous contrast. Angiographic images of the head were obtained using MRA technique without contrast. COMPARISON:  04/06/2020 and prior. FINDINGS: MRI HEAD FINDINGS Brain: No diffusion-weighted signal abnormality. No acute intracranial hemorrhage. No midline shift, ventriculomegaly or extra-axial fluid collection. 1.1 x 0.7 x 0.6 cm T2 hyperintense left temporal lesion with a T2 hypointense rim and associated SWI signal dropout likely reflects a cavernoma (15:7, 19:18). Mild cerebral atrophy with ex vacuo dilatation. Moderate chronic microvascular ischemic changes. Vascular: Please see MRA. Skull and upper cervical spine: Normal marrow signal. Sinuses/Orbits: Sequela of bilateral lens replacement. Pneumatized paranasal sinuses and mastoid air cells. Other: Please note that image quality is degraded by motion artifact. MRA HEAD FINDINGS Anterior circulation: Irregularity of the distal cervical and petrous ICA segments with mild narrowing secondary to atheromatous disease. Patent cavernous segments with mild distal narrowing on the right. Mild bilateral supraclinoid ICA narrowing. 1-2 mm left supraclinoid ICA outpouching (10:137), infundibulum versus small aneurysm. Patent ACAs. ACA irregularity may be secondary to atheromatous disease versus motion artifact. Mild to moderate bilateral A2 segment narrowing. Patent left MCA. Multifocal mild-to-moderate narrowing involving the right M1 and M2 segments. Multifocal mild left M2 segment narrowing. Posterior circulation: Dominant left vertebral artery. Right vertebral artery terminates as PICA. Patent basilar artery. Mild proximal left superior cerebellar artery narrowing. Patent bilateral PCAs. Mild left P2 segment narrowing. Venous sinuses: No evidence of thrombosis. Anatomic variants: None of  significance. IMPRESSION: MRI head: No acute intracranial process. Moderate chronic microvascular ischemic changes. 1.1 cm left temporal cavernoma. MRA head: Intracranial atheromatous disease with mild narrowing of the bilateral ICAs and proximal left superior cerebellar artery. 1-2 mm left supraclinoid ICA aneurysm versus infundibulum. Multifocal mild-to-moderate right M1/M2, left M2, bilateral A2 and left P2 segment narrowing.  Electronically Signed   By: Primitivo Gauze M.D.   On: 04/07/2020 09:46   US Carotid Bilateral (at [facility] and [facility] only)  Result Date: 04/07/2020 CLINICAL DATA:  Left-sided weakness.  TIA.  History of hypertension EXAM: BILATERAL CAROTID DUPLEX ULTRASOUND TECHNIQUE: Pearline Cables scale imaging, color Doppler and duplex ultrasound were performed of bilateral carotid and vertebral arteries in the neck. COMPARISON:  None. FINDINGS: Criteria: Quantification of carotid stenosis is based on velocity parameters that correlate the residual internal carotid diameter with NASCET-based stenosis levels, using the diameter of the distal internal carotid lumen as the denominator for stenosis measurement. The following velocity measurements were obtained: RIGHT ICA: 44/16 cm/sec CCA: 55/73 cm/sec SYSTOLIC ICA/CCA RATIO:  1.0 ECA: 27 cm/sec LEFT ICA: 49/17 cm/sec CCA: 22/02 cm/sec SYSTOLIC ICA/CCA RATIO:  1.2 ECA: 37 cm/sec RIGHT CAROTID ARTERY: There is a minimal amount of intimal thickening/atherosclerotic plaque involving the right carotid bulb, extending to involve the origin and proximal aspects of the right internal carotid artery (image 26), not resulting in elevated peak systolic velocities within the interrogated course of the right internal carotid artery to suggest a hemodynamically significant stenosis. RIGHT VERTEBRAL ARTERY:  Antegrade flow LEFT CAROTID ARTERY: There is a moderate amount of eccentric echogenic plaque within the left carotid bulb (images 50 and 52), extending to involve the origin  and proximal aspects of the left internal carotid artery (image 60), not resulting in elevated peak systolic velocities within the interrogated course of the left internal carotid artery to suggest a hemodynamically significant stenosis. LEFT VERTEBRAL ARTERY:  Antegrade flow IMPRESSION: Minimal to moderate amount of bilateral atherosclerotic plaque, left greater than right, not resulting in a hemodynamically significant stenosis within either internal carotid artery. Electronically Signed   By: Sandi Mariscal M.D.   On: 04/07/2020 10:31   US Venous Img Lower Unilateral Left  Result Date: 04/06/2020 CLINICAL DATA:  Weakness EXAM: LEFT LOWER EXTREMITY VENOUS DOPPLER ULTRASOUND TECHNIQUE: Gray-scale sonography with compression, as well as color and duplex ultrasound, were performed to evaluate the deep venous system(s) from the level of the common femoral vein through the popliteal and proximal calf veins. COMPARISON:  None. FINDINGS: VENOUS Normal compressibility of the common femoral, superficial femoral, and popliteal veins, as well as the visualized calf veins. Visualized portions of profunda femoral vein and great saphenous vein unremarkable. No filling defects to suggest DVT on grayscale or color Doppler imaging. Doppler waveforms show normal direction of venous flow, normal respiratory plasticity and response to augmentation. Limited views of the contralateral common femoral vein are unremarkable. OTHER None. Limitations: none IMPRESSION: Negative. Electronically Signed   By: Constance Holster M.D.   On: 04/06/2020 15:58   Portable chest 1 View  Result Date: 04/06/2020 CLINICAL DATA:  COVID-19 positive, weakness for 1 week EXAM: PORTABLE CHEST 1 VIEW COMPARISON:  04/06/2020 at 2:51 p.m. FINDINGS: Two frontal views of the chest are obtained. The cardiac silhouette is unremarkable. Scattered interstitial and alveolar opacities are identified, most pronounced at the left lung base. I cannot exclude a small  left pleural effusion. No pneumothorax. No acute bony abnormalities. IMPRESSION: 1. Scattered interstitial and alveolar opacities, greatest at the left lung base, consistent with multifocal pneumonia. 2. Small left pleural effusion. Electronically Signed   By: Randa Ngo M.D.   On: 04/06/2020 20:25   DG Chest Port 1 View  Result Date: 04/06/2020 CLINICAL DATA:  Weakness.  No chest complaints. EXAM: PORTABLE CHEST 1 VIEW COMPARISON:  Chest x-ray dated March 08, 2020. FINDINGS: The  patient is rotated to the left, limiting evaluation. This likely accounts for silhouetting of the left hemidiaphragm, with retrocardiac left lower lobe airspace disease less likely. The right lung is clear. No pneumothorax or large pleural effusion. No acute osseous abnormality. IMPRESSION: 1. Limited study due to rotation. No definite active disease, although repeat single-view chest x-ray without rotation or PA and lateral chest x-ray should be considered. Electronically Signed   By: Titus Dubin M.D.   On: 04/06/2020 15:03        Scheduled Meds: .  stroke: mapping our early stages of recovery book   Does not apply Once  . albuterol  2 puff Inhalation BID  . allopurinol  50 mg Oral Daily  . amiodarone  200 mg Oral Daily  . vitamin C  500 mg Oral Daily  . aspirin EC  81 mg Oral Daily  . dexamethasone (DECADRON) injection  6 mg Intravenous Q24H  . divalproex  500 mg Oral BID  . enoxaparin (LOVENOX) injection  30 mg Subcutaneous Q24H  . fluticasone  1 puff Inhalation BID  . levETIRAcetam  2,000 mg Oral QHS  . montelukast  10 mg Oral QHS  . zinc sulfate  220 mg Oral Daily   Continuous Infusions: . remdesivir 100 mg in NS 100 mL 100 mg (04/07/20 0942)     LOS: 0 days    Time spent: 35 minutes    Laguana Desautel Darleen Crocker, DO [facility]  If 7PM-7AM, please contact night-coverage [email or web address] 04/07/2020, 5:06 PM

## 2020-04-08 ENCOUNTER — Inpatient Hospital Stay (HOSPITAL_COMMUNITY): Payer: Medicare PPO

## 2020-04-08 DIAGNOSIS — R0602 Shortness of breath: Secondary | ICD-10-CM

## 2020-04-08 DIAGNOSIS — I361 Nonrheumatic tricuspid (valve) insufficiency: Secondary | ICD-10-CM

## 2020-04-08 DIAGNOSIS — U071 COVID-19: Secondary | ICD-10-CM | POA: Diagnosis not present

## 2020-04-08 DIAGNOSIS — I340 Nonrheumatic mitral (valve) insufficiency: Secondary | ICD-10-CM

## 2020-04-08 LAB — COMPREHENSIVE METABOLIC PANEL
ALT: 11 U/L (ref 0–44)
AST: 24 U/L (ref 15–41)
Albumin: 2.6 g/dL — ABNORMAL LOW (ref 3.5–5.0)
Alkaline Phosphatase: 44 U/L (ref 38–126)
Anion gap: 12 (ref 5–15)
BUN: 32 mg/dL — ABNORMAL HIGH (ref 8–23)
CO2: 18 mmol/L — ABNORMAL LOW (ref 22–32)
Calcium: 9.3 mg/dL (ref 8.9–10.3)
Chloride: 103 mmol/L (ref 98–111)
Creatinine, Ser: 1.86 mg/dL — ABNORMAL HIGH (ref 0.44–1.00)
GFR, Estimated: 25 mL/min — ABNORMAL LOW (ref >60)
Glucose, Bld: 153 mg/dL — ABNORMAL HIGH (ref 70–99)
Potassium: 5 mmol/L (ref 3.5–5.1)
Sodium: 133 mmol/L — ABNORMAL LOW (ref 135–145)
Total Bilirubin: 0.6 mg/dL (ref 0.3–1.2)
Total Protein: 6.3 g/dL — ABNORMAL LOW (ref 6.5–8.1)

## 2020-04-08 LAB — CBC WITH DIFFERENTIAL/PLATELET
Abs Immature Granulocytes: 0.35 10*3/uL — ABNORMAL HIGH (ref 0.00–0.07)
Basophils Absolute: 0.1 10*3/uL (ref 0.0–0.1)
Basophils Relative: 0 %
Eosinophils Absolute: 0 10*3/uL (ref 0.0–0.5)
Eosinophils Relative: 0 %
HCT: 31 % — ABNORMAL LOW (ref 36.0–46.0)
Hemoglobin: 9.5 g/dL — ABNORMAL LOW (ref 12.0–15.0)
Immature Granulocytes: 3 %
Lymphocytes Relative: 10 %
Lymphs Abs: 1.1 10*3/uL (ref 0.7–4.0)
MCH: 24.2 pg — ABNORMAL LOW (ref 26.0–34.0)
MCHC: 30.6 g/dL (ref 30.0–36.0)
MCV: 79.1 fL — ABNORMAL LOW (ref 80.0–100.0)
Monocytes Absolute: 0.1 10*3/uL (ref 0.1–1.0)
Monocytes Relative: 1 %
Neutro Abs: 10.2 10*3/uL — ABNORMAL HIGH (ref 1.7–7.7)
Neutrophils Relative %: 86 %
Platelets: 392 10*3/uL (ref 150–400)
RBC: 3.92 MIL/uL (ref 3.87–5.11)
RDW: 23.5 % — ABNORMAL HIGH (ref 11.5–15.5)
WBC: 11.8 10*3/uL — ABNORMAL HIGH (ref 4.0–10.5)
nRBC: 8.1 % — ABNORMAL HIGH (ref 0.0–0.2)

## 2020-04-08 LAB — URINALYSIS, ROUTINE W REFLEX MICROSCOPIC
Bacteria, UA: NONE SEEN
Bilirubin Urine: NEGATIVE
Glucose, UA: NEGATIVE mg/dL
Hgb urine dipstick: NEGATIVE
Ketones, ur: 5 mg/dL — AB
Leukocytes,Ua: NEGATIVE
Nitrite: NEGATIVE
Protein, ur: 30 mg/dL — AB
Specific Gravity, Urine: 1.021 (ref 1.005–1.030)
pH: 5 (ref 5.0–8.0)

## 2020-04-08 LAB — ECHOCARDIOGRAM COMPLETE
AR max vel: 2.2 cm2
AV Area VTI: 2.5 cm2
AV Area mean vel: 1.95 cm2
AV Mean grad: 2.2 mmHg
AV Peak grad: 3.9 mmHg
Ao pk vel: 0.99 m/s
Area-P 1/2: 3.72 cm2
Height: 64 in
S' Lateral: 2.4 cm
Weight: 3245.17 oz

## 2020-04-08 LAB — PHOSPHORUS: Phosphorus: 4.2 mg/dL (ref 2.5–4.6)

## 2020-04-08 LAB — C-REACTIVE PROTEIN: CRP: 5.5 mg/dL — ABNORMAL HIGH (ref <1.0)

## 2020-04-08 LAB — MAGNESIUM: Magnesium: 2.4 mg/dL (ref 1.7–2.4)

## 2020-04-08 LAB — FERRITIN: Ferritin: 72 ng/mL (ref 11–307)

## 2020-04-08 LAB — D-DIMER, QUANTITATIVE: D-Dimer, Quant: 2 ug/mL-FEU — ABNORMAL HIGH (ref 0.00–0.50)

## 2020-04-08 MED ORDER — SODIUM CHLORIDE 0.9 % IV SOLN: INTRAVENOUS | Status: AC

## 2020-04-08 MED ORDER — ADULT MULTIVITAMIN W/MINERALS CH
1.0000 | ORAL_TABLET | Freq: Every day | ORAL | Status: DC
  Administered 2020-04-08 – 2020-04-14 (×7): 1 via ORAL
  Filled 2020-04-08 (×11): qty 1

## 2020-04-08 MED ORDER — POLYVINYL ALCOHOL 1.4 % OP SOLN
1.0000 [drp] | OPHTHALMIC | Status: DC | PRN
  Administered 2020-04-08: 06:00:00 1 [drp] via OPHTHALMIC
  Filled 2020-04-08: qty 15

## 2020-04-08 MED ORDER — ENSURE ENLIVE PO LIQD
237.0000 mL | Freq: Three times a day (TID) | ORAL | Status: DC
  Administered 2020-04-08 – 2020-04-14 (×18): 237 mL via ORAL

## 2020-04-08 NOTE — Progress Notes (Signed)
*  PRELIMINARY RESULTS* Echocardiogram 2D Echocardiogram has been performed. Patient extremely confused during echo.  [name] 04/08/2020, 10:57 AM

## 2020-04-08 NOTE — Progress Notes (Signed)
PROGRESS NOTE    [name]  CNO:[id number] DOB: [birth year] DOA: 04/06/2020 PCP: [name], [name]   Brief Narrative:  [name] a 86 y.o.femalewith medical history significant ofchronic lower back pain, degenerative arthritis, eczema, edema, gout, headache, hiatal hernia, hypertension, internal hemorrhoids, neurogenic claudication, class II obesity, seizure disorder, history of other nonhemorrhagic stroke, urticaria who is coming to the emergency departmentafter her relatives noticed she was havingleft-sided weakness.   Stroke work-up has remained negative, however she still remains quite symptomatic from her Covid symptoms and is not eating very well.  She still has some periods of confusion.  Family members are agreeable to place her in SNF.   Assessment & Plan:   Principal Problem:   2019 novel coronavirus disease (COVID-19) Active Problems:   Hiatal hernia   Seizure disorder (HCC)   Essential hypertension, benign   Acute renal failure superimposed on stage 3 chronic kidney disease (HCC)   Gout   Asthma   Class 2 obesity   Paroxysmal atrial fibrillation (HCC)   Prolonged QT interval   Elevated troponin   Microcytic anemia   Hyperkalemia   Hyponatremia   Left-sided weakness   Pneumonia due to COVID-19 virus   2019 novel coronavirus disease (COVID-19) Continue on RA I-S and flutter valve. Advised to prone as tolerated. Bronchodilators as needed. Acetaminophen as needed. Antitussive/antiemetic as needed Remdesivir ongoing Continue dexamethasone 6 mg IVP daily Follow CBC, CMP and inflammatory markers.  Left-sided weakness-improving PT recommending SNF Hemoglobin A1c 5.2% LDL 72 Check carotid Doppler with minimal plaque Check echocardiogram pending MRI brain with no acute findings  Prolonged QT interval Magnesium was supplemented. Avoid QT interval prolonging meds Follow-up EKG in a.m.  Elevated troponin Check  echocardiogram-pending  Acute renal failure superimposed on stage 3 chronic kidney disease-improving Hydrated with bolus earlier in the ED. Judicious IV hydration as needed.. Follow-up creatinine level in a.m.  Hyperkalemia-resolved Received fluids earlier. Follow-up potassium level in a.m.  Hyponatremia-improving Decreased oral intake? Bilateral lower extremity edema. No cardiomegaly, but small pleural effusion. Follow monitor sodium level.  Hiatal hernia Untreated. Asymptomatic. Meds may prolong QT interval.  Seizure disorder (HCC) Continue Depakote 500 mg p.o. twice daily. Continue Keppra 2000 mg extended release at bedtime.  Essential hypertension, benign Not on antihypertensives. Was hypotensive earlier. Monitor blood pressure.  Gout Continue allopurinol 100 mg p.o. daily.  Asthma Continue Singulair 10 mg p.o. at bedtime. Supplemental oxygen as needed. Bronchodilators as needed.  Class 2 obesity Lifestyle modifications. Follow-up with PCP.  Microcytic anemia Monitor H&H.   DVT prophylaxis:Lovenox Code Status: Full Family Communication: Discussed with son on phone 12/24 Disposition Plan:  Status is: Inpatient  Remains inpatient appropriate because:IV treatments appropriate due to intensity of illness or inability to take PO and Inpatient level of care appropriate due to severity of illness   Dispo: The patient is from: Home  Anticipated d/c is to: SNF  Anticipated d/c date is: 2-3 days  Patient currently is not medically stable to d/c. IV medications for covid.  Consultants:   None  Procedures:   See below  Antimicrobials:  Anti-infectives (From admission, onward)   Start     Dose/Rate Route Frequency Ordered Stop   04/07/20 1000  remdesivir 100 mg in sodium chloride 0.9 % 100 mL IVPB        100 mg 200 mL/hr over 30 Minutes Intravenous Daily 04/06/20 1914 04/11/20  0959   04/07/20 0000  vancomycin variable dose per unstable renal function (pharmacist dosing)  Status:  Discontinued         Does not apply See admin instructions 04/06/20 1601 04/07/20 0807   04/06/20 2100  ceFEPIme (MAXIPIME) 2 g in sodium chloride 0.9 % 100 mL IVPB  Status:  Discontinued        2 g 200 mL/hr over 30 Minutes Intravenous Every 24 hours 04/06/20 1658 04/07/20 0807   04/06/20 1915  remdesivir 100 mg in sodium chloride 0.9 % 100 mL IVPB        100 mg 200 mL/hr over 30 Minutes Intravenous Every 30 min 04/06/20 1914 04/06/20 2220   04/06/20 1615  vancomycin (VANCOREADY) IVPB 2000 mg/400 mL        2,000 mg 200 mL/hr over 120 Minutes Intravenous  Once 04/06/20 1604 04/06/20 1838   04/06/20 1500  aztreonam (AZACTAM) 2 g in sodium chloride 0.9 % 100 mL IVPB        2 g 200 mL/hr over 30 Minutes Intravenous  Once 04/06/20 1459 04/06/20 1615   04/06/20 1500  vancomycin (VANCOCIN) IVPB 1000 mg/200 mL premix  Status:  Discontinued        1,000 mg 200 mL/hr over 60 Minutes Intravenous  Once 04/06/20 1459 04/06/20 1552       Subjective: Patient seen and evaluated today and continues to have some overall confusion and diminished appetite.  Objective: Vitals:   04/07/20 1602 04/07/20 2001 04/07/20 2034 04/08/20 0550  BP: 125/75  92/63 92/69  Pulse: (!) 103  100 100  Resp: 18  20 20   Temp: 97.6 F (36.4 C)  97.9 F (36.6 C) 97.9 F (36.6 C)  TempSrc:   Oral Oral  SpO2: 96% 92% 93% 100%  Weight:      Height:        Intake/Output Summary (Last 24 hours) at 04/08/2020 1251 Last data filed at 04/08/2020 0000 Gross per 24 hour  Intake 293.36 ml  Output --  Net 293.36 ml   Filed Weights   04/06/20 1510  Weight: 92 kg    Examination:  General exam: Appears calm and comfortable, appears confused Respiratory system: Clear to auscultation. Respiratory effort normal. Cardiovascular system: S1 & S2 heard, RRR.  Gastrointestinal system: Abdomen is soft Central nervous  system: Alert, but appears confused Extremities: No edema Skin: No significant lesions noted Psychiatry: Flat affect.    Data Reviewed: I have personally reviewed following labs and imaging studies  CBC: Recent Labs  Lab 04/06/20 1430 04/07/20 0503 04/08/20 0528  WBC 10.7* 12.3* 11.8*  NEUTROABS 8.8* 10.3* 10.2*  HGB 9.6* 9.9* 9.5*  HCT 31.1* 32.9* 31.0*  MCV 79.7* 80.8 79.1*  PLT 325 404* 440   Basic Metabolic Panel: Recent Labs  Lab 04/06/20 1430 04/06/20 1920 04/07/20 0503 04/08/20 0528  NA 132*  --  135 133*  K 5.2*  --  4.7 5.0  CL 102  --  103 103  CO2 20*  --  18* 18*  GLUCOSE 100*  --  110* 153*  BUN 19  --  21 32*  CREATININE 1.54*  --  1.39* 1.86*  CALCIUM 9.3  --  9.4 9.3  MG  --  2.1 2.5* 2.4  PHOS  --  3.4 4.1 4.2   GFR: Estimated Creatinine Clearance: 20.3 mL/min (A) (by C-G formula based on SCr of 1.86 mg/dL (H)). Liver Function Tests: Recent Labs  Lab 04/06/20 1430 04/07/20 0503 04/08/20 0528  AST 36 20 24  ALT 8 11 11   ALKPHOS 48 52 44  BILITOT 0.8 0.5 0.6  PROT 6.7 7.0 6.3*  ALBUMIN 2.7* 2.9* 2.6*   No results for input(s): LIPASE, AMYLASE in the last 168 hours. No results for input(s): AMMONIA in the last 168 hours. Coagulation Profile: Recent Labs  Lab 04/06/20 1920  INR 1.1   Cardiac Enzymes: No results for input(s): CKTOTAL, CKMB, CKMBINDEX, TROPONINI in the last 168 hours. BNP (last 3 results) No results for input(s): PROBNP in the last 8760 hours. HbA1C: Recent Labs    04/07/20 0503  HGBA1C 5.2   CBG: No results for input(s): GLUCAP in the last 168 hours. Lipid Profile: Recent Labs    04/07/20 0503  CHOL 120  HDL 33*  LDLCALC 72  TRIG 73  CHOLHDL 3.6   Thyroid Function Tests: No results for input(s): TSH, T4TOTAL, FREET4, T3FREE, THYROIDAB in the last 72 hours. Anemia Panel: Recent Labs    04/07/20 0503 04/08/20 0528  FERRITIN 42 72   Sepsis Labs: Recent Labs  Lab 04/06/20 1747 04/06/20 1920  04/07/20 0503  PROCALCITON  --  <0.10 <0.10  LATICACIDVEN 1.4 2.1* 1.2    Recent Results (from the past 240 hour(s))  Urine Culture     Status: Abnormal (Preliminary result)   Collection Time: 04/04/20  4:49 PM   UR  Result Value Ref Range Status   Urine Culture, Routine Preliminary report (A)  Preliminary   Organism ID, Bacteria Gram negative rods (A)  Preliminary    Comment: Greater than 100,000 colony forming units per mL  Microscopic Examination     Status: None   Collection Time: 04/04/20  4:59 PM   Urine  Result Value Ref Range Status   WBC, UA None seen 0 - 5 /hpf Final   RBC None seen 0 - 2 /hpf Final   Epithelial Cells (non renal) CANCELED      Comment: Test not performed  Result canceled by the ancillary.    Bacteria, UA Few None seen/Few Final  Resp Panel by RT-PCR (Flu A&B, Covid) Nasopharyngeal Swab     Status: Abnormal   Collection Time: 04/06/20  2:44 PM   Specimen: Nasopharyngeal Swab; Nasopharyngeal(NP) swabs in vial transport medium  Result Value Ref Range Status   SARS Coronavirus 2 by RT PCR POSITIVE (A) NEGATIVE Final    Comment: RESULT CALLED TO, READ BACK BY AND VERIFIED WITH: HASTINGS,K [id number] ON 04/06/20 BY HUFFINES,S. (NOTE) SARS-CoV-2 target nucleic acids are DETECTED.  The SARS-CoV-2 RNA is generally detectable in upper respiratory specimens during the acute phase of infection. Positive results are indicative of the presence of the identified virus, but do not rule out bacterial infection or co-infection with other pathogens not detected by the test. Clinical correlation with patient history and other diagnostic information is necessary to determine patient infection status. The expected result is Negative.  Fact Sheet for Patients: EntrepreneurPulse.com.au  Fact Sheet for Healthcare Providers: IncredibleEmployment.be  This test is not yet approved or cleared by the [place] FDA and  has been  authorized for detection and/or diagnosis of SARS-CoV-2 by FDA under an Emergency Use Authorization (EUA).  This EUA will remain in effect (meaning this  test can be used) for the duration of  the COVID-19 declaration under Section 564(b)(1) of the Act, 21 U.S.C. section 360bbb-3(b)(1), unless the authorization is terminated or revoked sooner.     Influenza A by PCR NEGATIVE NEGATIVE Final   Influenza B by PCR NEGATIVE NEGATIVE Final    Comment: (NOTE) The Xpert Xpress SARS-CoV-2/FLU/RSV plus assay  is intended as an aid in the diagnosis of influenza from Nasopharyngeal swab specimens and should not be used as a sole basis for treatment. Nasal washings and aspirates are unacceptable for Xpert Xpress SARS-CoV-2/FLU/RSV testing.  Fact Sheet for Patients: EntrepreneurPulse.com.au  Fact Sheet for Healthcare Providers: IncredibleEmployment.be  This test is not yet approved or cleared by the [place] FDA and has been authorized for detection and/or diagnosis of SARS-CoV-2 by FDA under an Emergency Use Authorization (EUA). This EUA will remain in effect (meaning this test can be used) for the duration of the COVID-19 declaration under Section 564(b)(1) of the Act, 21 U.S.C. section 360bbb-3(b)(1), unless the authorization is terminated or revoked.  Performed at [facility], [address], [place], [place] [address]   Blood Culture (routine x 2)     Status: None (Preliminary result)   Collection Time: 04/06/20  5:47 PM   Specimen: BLOOD RIGHT FOREARM  Result Value Ref Range Status   Specimen Description BLOOD RIGHT FOREARM  Final   Special Requests   Final    BOTTLES DRAWN AEROBIC ONLY Blood Culture results may not be optimal due to an inadequate volume of blood received in culture bottles   Culture   Final    NO GROWTH 2 DAYS Performed at [facility], [address], [place], [place] [address]    Report Status PENDING  Incomplete  Blood  Culture (routine x 2)     Status: None (Preliminary result)   Collection Time: 04/06/20  7:20 PM   Specimen: BLOOD  Result Value Ref Range Status   Specimen Description BLOOD  Final   Special Requests NONE  Final   Culture   Final    NO GROWTH 2 DAYS Performed at [facility], [address] [place], [place] [address]    Report Status PENDING  Incomplete         Radiology Studies: CT Head Wo Contrast  Result Date: 04/06/2020 CLINICAL DATA:  Left-sided weakness. EXAM: CT HEAD WITHOUT CONTRAST TECHNIQUE: Contiguous axial images were obtained from the base of the skull through the vertex without intravenous contrast. COMPARISON:  CT head March 12, 2019 FINDINGS: Brain: No evidence of acute large vascular territory infarction, hemorrhage, hydrocephalus, extra-axial collection or mass lesion/mass effect. Patchy white matter hypoattenuation appears similar to prior and most likely represents the sequela of chronic microvascular ischemic disease. Similar area of calcification in the inferior left anterior temporal lobe. There is venous gas in the region of the basal venous plexus, right cavernous sinus, right infratemporal fossa, and parapharyngeal space, likely related t gas with IV access. Vascular: Calcific atherosclerosis. No hyperdense vessel identified. Skull: No acute fracture. Sinuses/Orbits: Sinuses are clear.  Unremarkable orbits. Other: No mastoid effusions. IMPRESSION: 1. No evidence of acute intracranial abnormality. 2. Chronic microvascular ischemic disease. Electronically Signed   By: Margaretha Sheffield MD   On: 04/06/2020 16:40   MR ANGIO HEAD WO CONTRAST  Result Date: 04/07/2020 CLINICAL DATA:  Neuro deficit, acute, stroke suspected EXAM: MRI HEAD WITHOUT CONTRAST MRA HEAD WITHOUT CONTRAST TECHNIQUE: Multiplanar, multiecho pulse sequences of the brain and surrounding structures were obtained without intravenous contrast. Angiographic images of the head were obtained using MRA  technique without contrast. COMPARISON:  04/06/2020 and prior. FINDINGS: MRI HEAD FINDINGS Brain: No diffusion-weighted signal abnormality. No acute intracranial hemorrhage. No midline shift, ventriculomegaly or extra-axial fluid collection. 1.1 x 0.7 x 0.6 cm T2 hyperintense left temporal lesion with a T2 hypointense rim and associated SWI signal dropout likely reflects  a cavernoma (15:7, 19:18). Mild cerebral atrophy with ex vacuo dilatation. Moderate chronic microvascular ischemic changes. Vascular: Please see MRA. Skull and upper cervical spine: Normal marrow signal. Sinuses/Orbits: Sequela of bilateral lens replacement. Pneumatized paranasal sinuses and mastoid air cells. Other: Please note that image quality is degraded by motion artifact. MRA HEAD FINDINGS Anterior circulation: Irregularity of the distal cervical and petrous ICA segments with mild narrowing secondary to atheromatous disease. Patent cavernous segments with mild distal narrowing on the right. Mild bilateral supraclinoid ICA narrowing. 1-2 mm left supraclinoid ICA outpouching (10:137), infundibulum versus small aneurysm. Patent ACAs. ACA irregularity may be secondary to atheromatous disease versus motion artifact. Mild to moderate bilateral A2 segment narrowing. Patent left MCA. Multifocal mild-to-moderate narrowing involving the right M1 and M2 segments. Multifocal mild left M2 segment narrowing. Posterior circulation: Dominant left vertebral artery. Right vertebral artery terminates as PICA. Patent basilar artery. Mild proximal left superior cerebellar artery narrowing. Patent bilateral PCAs. Mild left P2 segment narrowing. Venous sinuses: No evidence of thrombosis. Anatomic variants: None of significance. IMPRESSION: MRI head: No acute intracranial process. Moderate chronic microvascular ischemic changes. 1.1 cm left temporal cavernoma. MRA head: Intracranial atheromatous disease with mild narrowing of the bilateral ICAs and proximal left  superior cerebellar artery. 1-2 mm left supraclinoid ICA aneurysm versus infundibulum. Multifocal mild-to-moderate right M1/M2, left M2, bilateral A2 and left P2 segment narrowing. Electronically Signed   By: Primitivo Gauze M.D.   On: 04/07/2020 09:46   MR BRAIN WO CONTRAST  Result Date: 04/07/2020 CLINICAL DATA:  Neuro deficit, acute, stroke suspected EXAM: MRI HEAD WITHOUT CONTRAST MRA HEAD WITHOUT CONTRAST TECHNIQUE: Multiplanar, multiecho pulse sequences of the brain and surrounding structures were obtained without intravenous contrast. Angiographic images of the head were obtained using MRA technique without contrast. COMPARISON:  04/06/2020 and prior. FINDINGS: MRI HEAD FINDINGS Brain: No diffusion-weighted signal abnormality. No acute intracranial hemorrhage. No midline shift, ventriculomegaly or extra-axial fluid collection. 1.1 x 0.7 x 0.6 cm T2 hyperintense left temporal lesion with a T2 hypointense rim and associated SWI signal dropout likely reflects a cavernoma (15:7, 19:18). Mild cerebral atrophy with ex vacuo dilatation. Moderate chronic microvascular ischemic changes. Vascular: Please see MRA. Skull and upper cervical spine: Normal marrow signal. Sinuses/Orbits: Sequela of bilateral lens replacement. Pneumatized paranasal sinuses and mastoid air cells. Other: Please note that image quality is degraded by motion artifact. MRA HEAD FINDINGS Anterior circulation: Irregularity of the distal cervical and petrous ICA segments with mild narrowing secondary to atheromatous disease. Patent cavernous segments with mild distal narrowing on the right. Mild bilateral supraclinoid ICA narrowing. 1-2 mm left supraclinoid ICA outpouching (10:137), infundibulum versus small aneurysm. Patent ACAs. ACA irregularity may be secondary to atheromatous disease versus motion artifact. Mild to moderate bilateral A2 segment narrowing. Patent left MCA. Multifocal mild-to-moderate narrowing involving the right M1 and  M2 segments. Multifocal mild left M2 segment narrowing. Posterior circulation: Dominant left vertebral artery. Right vertebral artery terminates as PICA. Patent basilar artery. Mild proximal left superior cerebellar artery narrowing. Patent bilateral PCAs. Mild left P2 segment narrowing. Venous sinuses: No evidence of thrombosis. Anatomic variants: None of significance. IMPRESSION: MRI head: No acute intracranial process. Moderate chronic microvascular ischemic changes. 1.1 cm left temporal cavernoma. MRA head: Intracranial atheromatous disease with mild narrowing of the bilateral ICAs and proximal left superior cerebellar artery. 1-2 mm left supraclinoid ICA aneurysm versus infundibulum. Multifocal mild-to-moderate right M1/M2, left M2, bilateral A2 and left P2 segment narrowing. Electronically Signed   By: Milus Mallick.D.  On: 04/07/2020 09:46   US Carotid Bilateral (at [facility] and [facility] only)  Result Date: 04/07/2020 CLINICAL DATA:  Left-sided weakness.  TIA.  History of hypertension EXAM: BILATERAL CAROTID DUPLEX ULTRASOUND TECHNIQUE: Pearline Cables scale imaging, color Doppler and duplex ultrasound were performed of bilateral carotid and vertebral arteries in the neck. COMPARISON:  None. FINDINGS: Criteria: Quantification of carotid stenosis is based on velocity parameters that correlate the residual internal carotid diameter with NASCET-based stenosis levels, using the diameter of the distal internal carotid lumen as the denominator for stenosis measurement. The following velocity measurements were obtained: RIGHT ICA: 44/16 cm/sec CCA: 67/67 cm/sec SYSTOLIC ICA/CCA RATIO:  1.0 ECA: 27 cm/sec LEFT ICA: 49/17 cm/sec CCA: 20/94 cm/sec SYSTOLIC ICA/CCA RATIO:  1.2 ECA: 37 cm/sec RIGHT CAROTID ARTERY: There is a minimal amount of intimal thickening/atherosclerotic plaque involving the right carotid bulb, extending to involve the origin and proximal aspects of the right internal carotid artery (image 26), not  resulting in elevated peak systolic velocities within the interrogated course of the right internal carotid artery to suggest a hemodynamically significant stenosis. RIGHT VERTEBRAL ARTERY:  Antegrade flow LEFT CAROTID ARTERY: There is a moderate amount of eccentric echogenic plaque within the left carotid bulb (images 50 and 52), extending to involve the origin and proximal aspects of the left internal carotid artery (image 60), not resulting in elevated peak systolic velocities within the interrogated course of the left internal carotid artery to suggest a hemodynamically significant stenosis. LEFT VERTEBRAL ARTERY:  Antegrade flow IMPRESSION: Minimal to moderate amount of bilateral atherosclerotic plaque, left greater than right, not resulting in a hemodynamically significant stenosis within either internal carotid artery. Electronically Signed   By: Sandi Mariscal M.D.   On: 04/07/2020 10:31   US Venous Img Lower Unilateral Left  Result Date: 04/06/2020 CLINICAL DATA:  Weakness EXAM: LEFT LOWER EXTREMITY VENOUS DOPPLER ULTRASOUND TECHNIQUE: Gray-scale sonography with compression, as well as color and duplex ultrasound, were performed to evaluate the deep venous system(s) from the level of the common femoral vein through the popliteal and proximal calf veins. COMPARISON:  None. FINDINGS: VENOUS Normal compressibility of the common femoral, superficial femoral, and popliteal veins, as well as the visualized calf veins. Visualized portions of profunda femoral vein and great saphenous vein unremarkable. No filling defects to suggest DVT on grayscale or color Doppler imaging. Doppler waveforms show normal direction of venous flow, normal respiratory plasticity and response to augmentation. Limited views of the contralateral common femoral vein are unremarkable. OTHER None. Limitations: none IMPRESSION: Negative. Electronically Signed   By: Constance Holster M.D.   On: 04/06/2020 15:58   Portable chest 1  View  Result Date: 04/06/2020 CLINICAL DATA:  COVID-19 positive, weakness for 1 week EXAM: PORTABLE CHEST 1 VIEW COMPARISON:  04/06/2020 at 2:51 p.m. FINDINGS: Two frontal views of the chest are obtained. The cardiac silhouette is unremarkable. Scattered interstitial and alveolar opacities are identified, most pronounced at the left lung base. I cannot exclude a small left pleural effusion. No pneumothorax. No acute bony abnormalities. IMPRESSION: 1. Scattered interstitial and alveolar opacities, greatest at the left lung base, consistent with multifocal pneumonia. 2. Small left pleural effusion. Electronically Signed   By: Randa Ngo M.D.   On: 04/06/2020 20:25   DG Chest Port 1 View  Result Date: 04/06/2020 CLINICAL DATA:  Weakness.  No chest complaints. EXAM: PORTABLE CHEST 1 VIEW COMPARISON:  Chest x-ray dated March 08, 2020. FINDINGS: The patient is rotated to the left, limiting evaluation. This likely accounts  for silhouetting of the left hemidiaphragm, with retrocardiac left lower lobe airspace disease less likely. The right lung is clear. No pneumothorax or large pleural effusion. No acute osseous abnormality. IMPRESSION: 1. Limited study due to rotation. No definite active disease, although repeat single-view chest x-ray without rotation or PA and lateral chest x-ray should be considered. Electronically Signed   By: Titus Dubin M.D.   On: 04/06/2020 15:03        Scheduled Meds: .  stroke: mapping our early stages of recovery book   Does not apply Once  . albuterol  2 puff Inhalation BID  . allopurinol  50 mg Oral Daily  . amiodarone  200 mg Oral Daily  . vitamin C  500 mg Oral Daily  . aspirin EC  81 mg Oral Daily  . dexamethasone (DECADRON) injection  6 mg Intravenous Q24H  . divalproex  500 mg Oral BID  . enoxaparin (LOVENOX) injection  30 mg Subcutaneous Q24H  . fluticasone  1 puff Inhalation BID  . levETIRAcetam  2,000 mg Oral QHS  . montelukast  10 mg Oral QHS  .  zinc sulfate  220 mg Oral Daily   Continuous Infusions: . sodium chloride 75 mL/hr at 04/08/20 0902  . remdesivir 100 mg in NS 100 mL 100 mg (04/08/20 0917)     LOS: 1 day    Time spent: 35 minutes    Iriana Artley D Manuella Ghazi, DO [facility]  If 7PM-7AM, please contact night-coverage [email or web address] 04/08/2020, 12:51 PM

## 2020-04-08 NOTE — NC FL2 (Signed)
[place] LEVEL OF CARE SCREENING TOOL     IDENTIFICATION  Patient Name: [name] Birthdate: [birth year] Sex: female Admission Date (Current Location): 04/06/2020  [facility] and [place] Number:  [employer] and Address:  [facility] [address] [place]      Provider Number: [id number]  Attending Physician Name and Address:  Rodena Goldmann, DO  Relative Name and Phone Number:  Marquisha Nikolov (son/POA) [phone]    Current Level of Care: Hospital Recommended Level of Care: [address] Prior Approval Number:    Date Approved/Denied:   PASRR Number: [id number]  Discharge Plan: SNF    Current Diagnoses: Patient Active Problem List   Diagnosis Date Noted  . Left-sided weakness 04/07/2020  . Grade I diastolic dysfunction 79/15/0569  . Pneumonia due to COVID-19 virus 04/07/2020  . 2019 novel coronavirus disease (COVID-19) 04/06/2020  . Prolonged QT interval 04/06/2020  . Elevated troponin 04/06/2020  . Microcytic anemia 04/06/2020  . Hyperkalemia 04/06/2020  . Hyponatremia 04/06/2020  . Palliative care by specialist   . Goals of care, counseling/discussion   . SVT (supraventricular tachycardia) ([place]) 02/04/2020  . PNA (pneumonia) 02/03/2020  . Atrial fibrillation with RVR ([place]) 01/10/2020  . Paroxysmal atrial fibrillation ([place]) 01/09/2020  . Pneumonia 01/04/2020  . Class 2 obesity 01/04/2020  . CKD (chronic kidney disease) stage 3, GFR 30-59 ml/min (HCC) 01/04/2020  . Nausea vomiting and diarrhea 03/12/2019  . Lung nodules 03/12/2019  . Atypical chest pain 12/31/2018  . Asthma 02/12/2017  . Chest pain 11/13/2016  . History of seizures 11/13/2016  . History of stroke 11/13/2016  . Allergic urticaria 08/30/2016  . GAD (generalized anxiety disorder) 08/30/2016  . Gout 08/30/2016  . Acute renal failure superimposed on stage 3 chronic kidney disease ([place]) 10/11/2015  . Neurogenic claudication ([place])    . Morbid obesity ([place])   . Edema   . Essential hypertension, benign 09/30/2014  . Secondary hyperparathyroidism, renal ([place]) 09/30/2014  . Proteinuria 09/30/2014  . Anemia associated with chronic renal failure 09/30/2014  . Chronic kidney disease (CKD), stage IV (severe) ([place]) 09/30/2014  . Hiatal hernia 08/21/2012  . Seizure disorder ([place]) 08/21/2012    Orientation RESPIRATION BLADDER Height & Weight     Self,Time,Situation,Place  Normal External catheter Weight: 202 lb 13.2 oz (92 kg) Height:  5\' 4"  (162.6 cm)  BEHAVIORAL SYMPTOMS/MOOD NEUROLOGICAL BOWEL NUTRITION STATUS    Convulsions/Seizures Continent Diet (Heart healthy/carb modified)  AMBULATORY STATUS COMMUNICATION OF NEEDS Skin   Extensive Assist Verbally Normal                       Personal Care Assistance Level of Assistance  Bathing,Feeding,Dressing,Total care Bathing Assistance: Limited assistance Feeding assistance: Independent Dressing Assistance: Limited assistance Total Care Assistance: Maximum assistance   Functional Limitations Info  Sight,Hearing,Speech Sight Info: Impaired Hearing Info: Adequate Speech Info: Adequate    SPECIAL CARE FACTORS FREQUENCY  PT (By licensed PT),OT (By licensed OT)     PT Frequency: 5 times weekly OT Frequency: 5 times weekly            Contractures Contractures Info: Not present    Additional Factors Info  Code Status,Allergies,Psychotropic Code Status Info: FULL Allergies Info: Advicor (Niacin-lovastatin Er); Caduet (Amlodipine-atorvastatin); Codeine; Dilantin (Phenytoin); Lescol (Fluvastatin); Pamelor (Nortriptyline); Penicillins; Sulfa Antibiotics; Trileptal (Oxcarbazepine); Ultram (Tramadol); Welchol (Colesevelam); Latex; Tomato Psychotropic Info: Depakote         Current Medications (04/08/2020):  This  is the current hospital active medication list Current Facility-Administered Medications  Medication Dose Route Frequency Provider Last Rate Last  Admin  .  stroke: mapping our early stages of recovery book   Does not apply Once Reubin Milan, MD      . 0.9 %  sodium chloride infusion   Intravenous Continuous Heath Lark D, DO 75 mL/hr at 04/08/20 0902 New Bag at 04/08/20 0902  . acetaminophen (TYLENOL) tablet 650 mg  650 mg Oral Q6H PRN Reubin Milan, MD       Or  . acetaminophen (TYLENOL) suppository 650 mg  650 mg Rectal Q6H PRN Reubin Milan, MD      . albuterol (VENTOLIN HFA) 108 (90 Base) MCG/ACT inhaler 2 puff  2 puff Inhalation BID Heath Lark D, DO   2 puff at 04/08/20 [phone]  . allopurinol (ZYLOPRIM) tablet 50 mg  50 mg Oral Daily Reubin Milan, MD   50 mg at 04/08/20 1962  . amiodarone (PACERONE) tablet 200 mg  200 mg Oral Daily Reubin Milan, MD   200 mg at 04/08/20 2297  . ascorbic acid (VITAMIN C) tablet 500 mg  500 mg Oral Daily Reubin Milan, MD   500 mg at 04/08/20 9892  . aspirin EC tablet 81 mg  81 mg Oral Daily Reubin Milan, MD   81 mg at 04/08/20 1194  . dexamethasone (DECADRON) injection 6 mg  6 mg Intravenous Q24H Reubin Milan, MD   6 mg at 04/07/20 2029  . divalproex (DEPAKOTE) DR tablet 500 mg  500 mg Oral BID Reubin Milan, MD   500 mg at 04/08/20 1740  . enoxaparin (LOVENOX) injection 30 mg  30 mg Subcutaneous Q24H Reubin Milan, MD   30 mg at 04/07/20 2036  . fluticasone (FLOVENT HFA) 110 MCG/ACT inhaler 1 puff  1 puff Inhalation BID Reubin Milan, MD   1 puff at 04/08/20 [phone]  . guaiFENesin-dextromethorphan (ROBITUSSIN DM) 100-10 MG/5ML syrup 10 mL  10 mL Oral Q4H PRN Reubin Milan, MD      . levETIRAcetam (KEPPRA XR) 24 hr tablet 2,000 mg  2,000 mg Oral QHS Reubin Milan, MD   2,000 mg at 04/07/20 2205  . montelukast (SINGULAIR) tablet 10 mg  10 mg Oral QHS Reubin Milan, MD   10 mg at 04/07/20 2205  . polyethylene glycol (MIRALAX / GLYCOLAX) packet 17 g  17 g Oral Daily PRN Reubin Milan, MD      . polyvinyl alcohol  (LIQUIFILM TEARS) 1.4 % ophthalmic solution 1 drop  1 drop Right Eye PRN Manuella Ghazi, Pratik D, DO   1 drop at 04/08/20 0559  . prochlorperazine (COMPAZINE) injection 5 mg  5 mg Intravenous Q4H PRN Reubin Milan, MD      . remdesivir 100 mg in sodium chloride 0.9 % 100 mL IVPB  100 mg Intravenous Daily Reubin Milan, MD 200 mL/hr at 04/08/20 0917 100 mg at 04/08/20 0917  . zinc sulfate capsule 220 mg  220 mg Oral Daily Reubin Milan, MD   220 mg at 04/08/20 8185     Discharge Medications: Please see discharge summary for a list of discharge medications.  Relevant Imaging Results:  Relevant Lab Results:   Additional Information SSN: [id number]  Iona Beard, [place]

## 2020-04-08 NOTE — TOC Progression Note (Signed)
Transition of Care [facility]) - Progression Note    Patient Details  Name: [name] MRN: [id number] Date of Birth: 1923-10-27  Transition of Care [facility]) CM/SW [place], [place] Phone Number: 04/08/2020, 12:37 PM  Clinical Narrative:    CSW spoke with pts son/POA Sharmeka Palmisano [phone] about if family would be seeking SNF or [place] for pt. Per Mr. Schaafsma they are interested in SNF. CSW informed Mr. Dicostanzo that there are two facilities that can accept COVID+ pts. Mr. Macfarlane is understanding and agreeable to referral being made to those two facilities. Mr. Frankowski asked that [place] email him link to medicare.gov website, email is [email or web address] CSW to send link in email. CSW updates pts daughter Wiletta B[place] of plan, Ms. Terra is understanding. CSW completed Fl2 and will send to facilities once cosigned. Mr. Pardy asked that he is updated with any updates when they are available. TOC to follow.    Expected Discharge Plan: [place] Barriers to Discharge: Continued Medical Work up  Expected Discharge Plan and Services Expected Discharge Plan: [place] arrangements for the past 2 months: Single Family Home                                       Social Determinants of Health (SDOH) Interventions    Readmission Risk Interventions Readmission Risk Prevention Plan 02/08/2020 01/07/2020  Transportation Screening Complete Complete  PCP or Specialist Appt within 5-7 Days - Complete  Home Care Screening - Complete  Medication Review (RN CM) - Complete  HRI or Home Care Consult Complete -  Social Work Consult for Recovery Care Planning/Counseling Complete -  Palliative Care Screening Complete -  Medication Review Press photographer) Complete -  Some recent data might be hidden

## 2020-04-08 NOTE — Progress Notes (Signed)
Initial Nutrition Assessment  DOCUMENTATION CODES:   Obesity unspecified  INTERVENTION:  Ensure Enlive/Plus po TID to support improved nutrition intake   Encourage meal intake and assist as needed   NUTRITION DIAGNOSIS:   Inadequate oral intake related to acute illness (COVID 19) as evidenced by estimated needs (reported poor intake by medical staff) and meal intake <50%.   GOAL:  Patient will meet greater than or equal to 90% of their needs   MONITOR:  PO intake,Supplement acceptance,Labs,Skin,Weight trends   REASON FOR ASSESSMENT:    (Rounds)    ASSESSMENT: Patient is an obese  86 yo female with history of HTN, Gout, Hiatal hernia, Stroke, CKD-3, edema, chronic low back pain. She presents with COVID-19.   Patient diet fully advanced. Discussed with nursing. Patient is very weak and required partial assistance with meal. She ate her pineapple. Meal intake reported as < 50% consumed lunch today. At high risk for acute malnutrition given her limited intake and increased needs.  Weight loss 2.6 kg (2.8%) the past 2 months, 5.9 kg (6%) the past 3 months.     Medications reviewed and include: ascorbic acid, zinc, decadron, depakote, keppra  IV-NS@75  ml/hr- d/c at [place] reviewed:  12/24- sodium 133 (L), Potassium 5.0, Glucose 153 (H), BUN 32 (H), Cr 1.86 (H). Ferritin 72 wnl, CRP-5.5 (H), WBC-11.8 (H), Hgb 9.5 (H).    Diet Order:   Diet Order            Diet heart healthy/carb modified Room service appropriate? Yes; Fluid consistency: Thin  Diet effective now                 EDUCATION NEEDS:   Not appropriate for education at this time  Skin:  Skin Assessment: Reviewed RN Assessment  Last BM:  12/22  Height:   Ht Readings from Last 1 Encounters:  04/06/20 5\' 4"  (1.626 m)    Weight:   Wt Readings from Last 1 Encounters:  04/06/20 92 kg    Ideal Body Weight:   55 kg  BMI:  Body mass index is 34.81 kg/m.  Estimated Nutritional Needs:   Kcal:   1610-9604  Protein:  77-84  Fluid:  1.6-1.7 liters daily   Colman Cater MS,RD,CSG,LDN Pager: Shea Evans

## 2020-04-08 NOTE — Progress Notes (Signed)
Physical Therapy Treatment Patient Details Name: [name] MRN: [id number] DOB: 1923-05-01 Today's Date: 04/08/2020    History of Present Illness CLORINE SWING is a 86 y.o. female with medical history significant of chronic lower back pain, degenerative arthritis, eczema, edema, gout, headache, hiatal hernia, hypertension, internal hemorrhoids, neurogenic claudication, class II obesity, seizure disorder, history of other nonhemorrhagic stroke, urticaria who is coming to the emergency department after her relatives noticed she was having left-sided weakness.  Her daughter told Dr. Eulis Foster that she has not been eating or drinking much recently.  She has also been complaining of generalized weakness to her daughter for the past several days.  She denies fever, but complains of chills, fatigue, dyspnea and nonproductive cough.  No abdominal pain, nausea or vomiting, diarrhea, constipation, melena or hematochezia.  No dysuria, frequency or hematuria.  No polyuria, polydipsia, polyphagia or blurred vision.  She is unvaccinated against coronavirus.    PT Comments    Patient agreeable for therapy and states she sat up in chair earlier in day with nursing staff assisting - confirmed by RN.  Patient demonstrates slow labored movement for sitting up at bedside, tends to lean backwards while completing BLE exercises requiring verbal and occasional Min assist tactile cueing to keep trunk in midline, required multiple attempts to complete sit to stands due to BLE weakness, has to lean over RW with flexed trunk when taking steps, limited mostly due to c/o fatigue and generalized weakness.  Patient tolerated sitting up in chair after therapy - RN notified.  Patient will benefit from continued physical therapy in hospital and recommended venue below to increase strength, balance, endurance for safe ADLs and gait.   Follow Up Recommendations  SNF;Supervision for mobility/OOB;Supervision - Intermittent      Equipment Recommendations  None recommended by PT    Recommendations for Other Services       Precautions / Restrictions Precautions Precautions: Fall Restrictions Weight Bearing Restrictions: No    Mobility  Bed Mobility Overal bed mobility: Needs Assistance Bed Mobility: Supine to Sit     Supine to sit: Min guard     General bed mobility comments: increased time, labored movement  Transfers Overall transfer level: Needs assistance Equipment used: Rolling walker (2 wheeled) Transfers: Sit to/from [facility] Sit to Stand: Mod assist Stand pivot transfers: Min assist       General transfer comment: required multiple attempts to complete sit to stands due to BLE weakness  Ambulation/Gait Ambulation/Gait assistance: Min assist;Mod assist Gait Distance (Feet): 15 Feet Assistive device: Rolling walker (2 wheeled) Gait Pattern/deviations: Decreased step length - right;Decreased step length - left;Decreased stride length;Trunk flexed Gait velocity: decreased   General Gait Details: slow labored cadence with trunk flexed due to weakness, limited secondary to c/o fatigue   Stairs             Wheelchair Mobility    Modified Rankin (Stroke Patients Only)       Balance Overall balance assessment: Needs assistance Sitting-balance support: Feet supported;No upper extremity supported Sitting balance-Leahy Scale: Fair Sitting balance - Comments: fair/good static sitting balance, fair dynamic with tendency to lean backwards while completing BLE exercises Postural control: Posterior lean Standing balance support: During functional activity;Bilateral upper extremity supported Standing balance-Leahy Scale: Fair Standing balance comment: using RW                            Cognition Arousal/Alertness: Awake/alert Behavior During Therapy: [facility]  for tasks assessed/performed Overall Cognitive Status: Within Functional Limits for tasks  assessed                                        Exercises General Exercises - Lower Extremity Long Arc Quad: Seated;AROM;Strengthening;Both;10 reps Hip Flexion/Marching: Seated;AROM;Strengthening;Both;10 reps Toe Raises: Seated;AROM;Strengthening;Both;10 reps Heel Raises: Seated;AROM;Strengthening;Both;10 reps    General Comments        Pertinent Vitals/Pain Pain Assessment: No/denies pain    Home Living                      Prior Function            PT Goals (current goals can now be found in the care plan section) Acute Rehab PT Goals Patient Stated Goal: return home with family, caregivers, neighbors to assist PT Goal Formulation: With patient Time For Goal Achievement: 04/21/20 Potential to Achieve Goals: Good Progress towards PT goals: Progressing toward goals    Frequency    Min 3X/week      PT Plan Current plan remains appropriate    Co-evaluation              AM-PAC PT "6 Clicks" Mobility   Outcome Measure  Help needed turning from your back to your side while in a flat bed without using bedrails?: None Help needed moving from lying on your back to sitting on the side of a flat bed without using bedrails?: A Little Help needed moving to and from a bed to a chair (including a wheelchair)?: A Lot Help needed standing up from a chair using your arms (e.g., wheelchair or bedside chair)?: A Lot Help needed to walk in hospital room?: A Lot Help needed climbing 3-5 steps with a railing? : A Lot 6 Click Score: 15    End of Session Equipment Utilized During Treatment: Oxygen Activity Tolerance: Patient tolerated treatment well;Patient limited by fatigue Patient left: in chair;with call bell/phone within reach;with chair alarm set Nurse Communication: Mobility status PT Visit Diagnosis: Unsteadiness on feet (R26.81);Other abnormalities of gait and mobility (R26.89);Muscle weakness (generalized) (M62.81)     Time:  4097-3532 PT Time Calculation (min) (ACUTE ONLY): 33 min  Charges:  $Therapeutic Exercise: 8-22 mins $Therapeutic Activity: 8-22 mins                     2:38 PM, 04/08/20 Lonell Grandchild, MPT Physical Therapist with [facility] [phone] office [phone] mobile phone

## 2020-04-09 ENCOUNTER — Inpatient Hospital Stay (HOSPITAL_COMMUNITY): Payer: Medicare PPO

## 2020-04-09 DIAGNOSIS — U071 COVID-19: Secondary | ICD-10-CM | POA: Diagnosis not present

## 2020-04-09 LAB — COMPREHENSIVE METABOLIC PANEL
ALT: 11 U/L (ref 0–44)
AST: 19 U/L (ref 15–41)
Albumin: 2.6 g/dL — ABNORMAL LOW (ref 3.5–5.0)
Alkaline Phosphatase: 43 U/L (ref 38–126)
Anion gap: 9 (ref 5–15)
BUN: 40 mg/dL — ABNORMAL HIGH (ref 8–23)
CO2: 19 mmol/L — ABNORMAL LOW (ref 22–32)
Calcium: 9.1 mg/dL (ref 8.9–10.3)
Chloride: 105 mmol/L (ref 98–111)
Creatinine, Ser: 1.61 mg/dL — ABNORMAL HIGH (ref 0.44–1.00)
GFR, Estimated: 29 mL/min — ABNORMAL LOW (ref >60)
Glucose, Bld: 130 mg/dL — ABNORMAL HIGH (ref 70–99)
Potassium: 4.7 mmol/L (ref 3.5–5.1)
Sodium: 133 mmol/L — ABNORMAL LOW (ref 135–145)
Total Bilirubin: 0.5 mg/dL (ref 0.3–1.2)
Total Protein: 6.2 g/dL — ABNORMAL LOW (ref 6.5–8.1)

## 2020-04-09 LAB — FERRITIN: Ferritin: 43 ng/mL (ref 11–307)

## 2020-04-09 LAB — CBC WITH DIFFERENTIAL/PLATELET
Abs Immature Granulocytes: 0.78 10*3/uL — ABNORMAL HIGH (ref 0.00–0.07)
Basophils Absolute: 0.1 10*3/uL (ref 0.0–0.1)
Basophils Relative: 1 %
Eosinophils Absolute: 0 10*3/uL (ref 0.0–0.5)
Eosinophils Relative: 0 %
HCT: 28.8 % — ABNORMAL LOW (ref 36.0–46.0)
Hemoglobin: 8.9 g/dL — ABNORMAL LOW (ref 12.0–15.0)
Immature Granulocytes: 6 %
Lymphocytes Relative: 9 %
Lymphs Abs: 1.2 10*3/uL (ref 0.7–4.0)
MCH: 24.4 pg — ABNORMAL LOW (ref 26.0–34.0)
MCHC: 30.9 g/dL (ref 30.0–36.0)
MCV: 78.9 fL — ABNORMAL LOW (ref 80.0–100.0)
Monocytes Absolute: 0.1 10*3/uL (ref 0.1–1.0)
Monocytes Relative: 1 %
Neutro Abs: 11.1 10*3/uL — ABNORMAL HIGH (ref 1.7–7.7)
Neutrophils Relative %: 83 %
Platelets: 390 10*3/uL (ref 150–400)
RBC: 3.65 MIL/uL — ABNORMAL LOW (ref 3.87–5.11)
RDW: 23.6 % — ABNORMAL HIGH (ref 11.5–15.5)
WBC: 13.3 10*3/uL — ABNORMAL HIGH (ref 4.0–10.5)
nRBC: 12.8 % — ABNORMAL HIGH (ref 0.0–0.2)

## 2020-04-09 LAB — PHOSPHORUS: Phosphorus: 3.4 mg/dL (ref 2.5–4.6)

## 2020-04-09 LAB — MAGNESIUM: Magnesium: 2.3 mg/dL (ref 1.7–2.4)

## 2020-04-09 LAB — PROCALCITONIN: Procalcitonin: 0.12 ng/mL

## 2020-04-09 LAB — C-REACTIVE PROTEIN: CRP: 3.7 mg/dL — ABNORMAL HIGH (ref <1.0)

## 2020-04-09 LAB — BRAIN NATRIURETIC PEPTIDE: B Natriuretic Peptide: 1534 pg/mL — ABNORMAL HIGH (ref 0.0–100.0)

## 2020-04-09 LAB — URINE CULTURE: Culture: NO GROWTH

## 2020-04-09 LAB — D-DIMER, QUANTITATIVE: D-Dimer, Quant: 2.44 ug/mL-FEU — ABNORMAL HIGH (ref 0.00–0.50)

## 2020-04-09 MED ORDER — FUROSEMIDE 10 MG/ML IJ SOLN
20.0000 mg | Freq: Once | INTRAMUSCULAR | Status: AC
  Administered 2020-04-09: 13:00:00 20 mg via INTRAVENOUS
  Filled 2020-04-09: qty 2

## 2020-04-09 NOTE — Plan of Care (Signed)
  Problem: Education: Goal: Knowledge of disease or condition will improve Outcome: Progressing Goal: Knowledge of secondary prevention will improve Outcome: Progressing Goal: Knowledge of patient specific risk factors addressed and post discharge goals established will improve Outcome: Progressing   

## 2020-04-09 NOTE — Progress Notes (Signed)
PROGRESS NOTE    KONSTANCE HAPPEL  CHE:[id number] DOB: 29-May-1923 DOA: 04/06/2020 PCP: [name], [name]   Brief Narrative:  [name] a 86 y.o.femalewith medical history significant ofchronic lower back pain, degenerative arthritis, eczema, edema, gout, headache, hiatal hernia, hypertension, internal hemorrhoids, neurogenic claudication, class II obesity, seizure disorder, history of other nonhemorrhagic stroke, urticaria who is coming to the emergency departmentafter her relatives noticed she was havingleft-sided weakness.  Stroke work-up has remained negative, however she still remains quite symptomatic from her Covid symptoms and is not eating very well.  She still has some periods of confusion.  Family members are agreeable to place her in SNF.   Assessment & Plan:   Principal Problem:   2019 novel coronavirus disease (COVID-19) Active Problems:   Hiatal hernia   Seizure disorder (HCC)   Essential hypertension, benign   Acute renal failure superimposed on stage 3 chronic kidney disease (HCC)   Gout   Asthma   Class 2 obesity   Paroxysmal atrial fibrillation (HCC)   Prolonged QT interval   Elevated troponin   Microcytic anemia   Hyperkalemia   Hyponatremia   Left-sided weakness   Pneumonia due to COVID-19 virus  2019 novel coronavirus disease (COVID-19) Continue on RA I-S and flutter valve. Advised to prone as tolerated. Bronchodilators as needed. Acetaminophen as needed. Antitussive/antiemetic as needed Remdesivir ongoing Continue dexamethasone 6 mg IVP daily Follow CBC, CMP and inflammatory markers -Patient currently on 3 L nasal cannula oxygen from room air yesterday.  Chest x-ray stable and procalcitonin low.  BNP elevating after short period of fluid administration yesterday.  Plan to administer 1 dose of IV Lasix today.  Left-sided weakness-resolved PT recommending SNF and family members okay with this Hemoglobin A1c5.2% LDL 72 Check  carotid Dopplerwith minimal plaque 2D echocardiogram as below MRI brain with no acute findings  Prolonged QT interval Magnesium was supplemented. Avoid QT interval prolonging meds Follow-up EKG in a.m.  Elevated troponin 2D echocardiogram 65 to 70% noted with moderate pericardial effusion and LVH noted  Acute renal failure superimposed on stage 3 chronic kidney disease-improving Hydrated with bolus earlier in the ED. Judicious IV hydration as needed. -Plan to give Lasix today Follow-up creatinine level in a.m.  Hyperkalemia-resolved Received fluids earlier. Follow-up potassium level in a.m.  Hyponatremia-improving Decreased oral intake? Bilateral lower extremity edema. No cardiomegaly, but small pleural effusion. Follow monitor sodium level.  Hiatal hernia Untreated. Asymptomatic. Meds may prolong QT interval.  Seizure disorder (HCC) Continue Depakote 500 mg p.o. twice daily. Continue Keppra 2000 mg extended release at bedtime.  Essential hypertension, benign Not on antihypertensives. Was hypotensive earlier. Monitor blood pressure.  Gout Continue allopurinol 100 mg p.o. daily.  Asthma Continue Singulair 10 mg p.o. at bedtime. Supplemental oxygen as needed. Bronchodilators as needed.  Class 2 obesity Lifestyle modifications. Follow-up with PCP.  Microcytic anemia Monitor H&H.   DVT prophylaxis:Lovenox Code Status:Full Family Communication:Discussed with son on phone 12/24 Disposition Plan: Status is: Inpatient  Remains inpatient appropriate because:IV treatments appropriate due to intensity of illness or inability to take PO and Inpatient level of care appropriate due to severity of illness   Dispo: The patient is from:Home Anticipated d/c is to:SNF Anticipated d/c date is: 1-2 days Patient currently is not medically stable to d/c.IV medications for  covid.  Consultants:  None  Procedures:  See below  Antimicrobials:  Anti-infectives (From admission, onward)   Start     Dose/Rate Route Frequency Ordered Stop   04/07/20 1000  remdesivir 100 mg in sodium chloride 0.9 % 100 mL IVPB        100 mg 200 mL/hr over 30 Minutes Intravenous Daily 04/06/20 1914 04/11/20 0959   04/07/20 0000  vancomycin variable dose per unstable renal function (pharmacist dosing)  Status:  Discontinued         Does not apply See admin instructions 04/06/20 1601 04/07/20 0807   04/06/20 2100  ceFEPIme (MAXIPIME) 2 g in sodium chloride 0.9 % 100 mL IVPB  Status:  Discontinued        2 g 200 mL/hr over 30 Minutes Intravenous Every 24 hours 04/06/20 1658 04/07/20 0807   04/06/20 1915  remdesivir 100 mg in sodium chloride 0.9 % 100 mL IVPB        100 mg 200 mL/hr over 30 Minutes Intravenous Every 30 min 04/06/20 1914 04/06/20 2220   04/06/20 1615  vancomycin (VANCOREADY) IVPB 2000 mg/400 mL        2,000 mg 200 mL/hr over 120 Minutes Intravenous  Once 04/06/20 1604 04/06/20 1838   04/06/20 1500  aztreonam (AZACTAM) 2 g in sodium chloride 0.9 % 100 mL IVPB        2 g 200 mL/hr over 30 Minutes Intravenous  Once 04/06/20 1459 04/06/20 1615   04/06/20 1500  vancomycin (VANCOCIN) IVPB 1000 mg/200 mL premix  Status:  Discontinued        1,000 mg 200 mL/hr over 60 Minutes Intravenous  Once 04/06/20 1459 04/06/20 1552       Subjective: Patient seen and evaluated today with no new acute complaints or concerns. No acute concerns or events noted overnight.  She is currently on 3 L nasal cannula oxygen.  She appears to be eating her meals adequately per staff.  Objective: Vitals:   04/08/20 2300 04/09/20 0158 04/09/20 0640 04/09/20 0714  BP:  123/76 107/72   Pulse:  100 (!) 103   Resp:  20 18   Temp: 97.6 F (36.4 C) (!) 97.3 F (36.3 C) 98.2 F (36.8 C)   TempSrc: Temporal Oral    SpO2: 99% 96% 98% 98%  Weight:      Height:        Intake/Output  Summary (Last 24 hours) at 04/09/2020 1244 Last data filed at 04/09/2020 0700 Gross per 24 hour  Intake 1184.48 ml  Output 500 ml  Net 684.48 ml   Filed Weights   04/06/20 1510  Weight: 92 kg    Examination:  General exam: Appears calm and comfortable  Respiratory system: Clear to auscultation. Respiratory effort normal.  Currently on 3 L nasal cannula oxygen Cardiovascular system: S1 & S2 heard, RRR.  Gastrointestinal system: Abdomen is soft Central nervous system: Alert and awake Extremities: No edema Skin: No significant lesions noted Psychiatry: Flat affect.    Data Reviewed: I have personally reviewed following labs and imaging studies  CBC: Recent Labs  Lab 04/06/20 1430 04/07/20 0503 04/08/20 0528 04/09/20 0706  WBC 10.7* 12.3* 11.8* 13.3*  NEUTROABS 8.8* 10.3* 10.2* 11.1*  HGB 9.6* 9.9* 9.5* 8.9*  HCT 31.1* 32.9* 31.0* 28.8*  MCV 79.7* 80.8 79.1* 78.9*  PLT 325 404* 392 277   Basic Metabolic Panel: Recent Labs  Lab 04/06/20 1430 04/06/20 1920 04/07/20 0503 04/08/20 0528 04/09/20 0706  NA 132*  --  135 133* 133*  K 5.2*  --  4.7 5.0 4.7  CL 102  --  103 103 105  CO2 20*  --  18* 18* 19*  GLUCOSE 100*  --  110* 153* 130*  BUN 19  --  21 32* 40*  CREATININE 1.54*  --  1.39* 1.86* 1.61*  CALCIUM 9.3  --  9.4 9.3 9.1  MG  --  2.1 2.5* 2.4 2.3  PHOS  --  3.4 4.1 4.2 3.4   GFR: Estimated Creatinine Clearance: 23.5 mL/min (A) (by C-G formula based on SCr of 1.61 mg/dL (H)). Liver Function Tests: Recent Labs  Lab 04/06/20 1430 04/07/20 0503 04/08/20 0528 04/09/20 0706  AST 36 20 24 19   ALT 8 11 11 11   ALKPHOS 48 52 44 43  BILITOT 0.8 0.5 0.6 0.5  PROT 6.7 7.0 6.3* 6.2*  ALBUMIN 2.7* 2.9* 2.6* 2.6*   No results for input(s): LIPASE, AMYLASE in the last 168 hours. No results for input(s): AMMONIA in the last 168 hours. Coagulation Profile: Recent Labs  Lab 04/06/20 1920  INR 1.1   Cardiac Enzymes: No results for input(s): CKTOTAL,  CKMB, CKMBINDEX, TROPONINI in the last 168 hours. BNP (last 3 results) No results for input(s): PROBNP in the last 8760 hours. HbA1C: Recent Labs    04/07/20 0503  HGBA1C 5.2   CBG: No results for input(s): GLUCAP in the last 168 hours. Lipid Profile: Recent Labs    04/07/20 0503  CHOL 120  HDL 33*  LDLCALC 72  TRIG 73  CHOLHDL 3.6   Thyroid Function Tests: No results for input(s): TSH, T4TOTAL, FREET4, T3FREE, THYROIDAB in the last 72 hours. Anemia Panel: Recent Labs    04/08/20 0528 04/09/20 0706  FERRITIN 72 43   Sepsis Labs: Recent Labs  Lab 04/06/20 1747 04/06/20 1920 04/07/20 0503 04/09/20 0706  PROCALCITON  --  <0.10 <0.10 0.12  LATICACIDVEN 1.4 2.1* 1.2  --     Recent Results (from the past 240 hour(s))  Urine Culture     Status: Abnormal (Preliminary result)   Collection Time: 04/04/20  4:49 PM   UR  Result Value Ref Range Status   Urine Culture, Routine Preliminary report (A)  Preliminary   Organism ID, Bacteria Gram negative rods (A)  Preliminary    Comment: Greater than 100,000 colony forming units per mL  Microscopic Examination     Status: None   Collection Time: 04/04/20  4:59 PM   Urine  Result Value Ref Range Status   WBC, UA None seen 0 - 5 /hpf Final   RBC None seen 0 - 2 /hpf Final   Epithelial Cells (non renal) CANCELED      Comment: Test not performed  Result canceled by the ancillary.    Bacteria, UA Few None seen/Few Final  Resp Panel by RT-PCR (Flu A&B, Covid) Nasopharyngeal Swab     Status: Abnormal   Collection Time: 04/06/20  2:44 PM   Specimen: Nasopharyngeal Swab; Nasopharyngeal(NP) swabs in vial transport medium  Result Value Ref Range Status   SARS Coronavirus 2 by RT PCR POSITIVE (A) NEGATIVE Final    Comment: RESULT CALLED TO, READ BACK BY AND VERIFIED WITH: HASTINGS,K [id number] ON 04/06/20 BY HUFFINES,S. (NOTE) SARS-CoV-2 target nucleic acids are DETECTED.  The SARS-CoV-2 RNA is generally detectable in upper  respiratory specimens during the acute phase of infection. Positive results are indicative of the presence of the identified virus, but do not rule out bacterial infection or co-infection with other pathogens not detected by the test. Clinical correlation with patient history and other diagnostic information is necessary to determine patient infection status. The expected result is Negative.  Fact Sheet for Patients: EntrepreneurPulse.com.au  Fact Sheet for Healthcare Providers: IncredibleEmployment.be  This test is not yet approved or cleared by the [place] FDA and  has been authorized for detection and/or diagnosis of SARS-CoV-2 by FDA under an Emergency Use Authorization (EUA).  This EUA will remain in effect (meaning this  test can be used) for the duration of  the COVID-19 declaration under Section 564(b)(1) of the Act, 21 U.S.C. section 360bbb-3(b)(1), unless the authorization is terminated or revoked sooner.     Influenza A by PCR NEGATIVE NEGATIVE Final   Influenza B by PCR NEGATIVE NEGATIVE Final    Comment: (NOTE) The Xpert Xpress SARS-CoV-2/FLU/RSV plus assay is intended as an aid in the diagnosis of influenza from Nasopharyngeal swab specimens and should not be used as a sole basis for treatment. Nasal washings and aspirates are unacceptable for Xpert Xpress SARS-CoV-2/FLU/RSV testing.  Fact Sheet for Patients: EntrepreneurPulse.com.au  Fact Sheet for Healthcare Providers: IncredibleEmployment.be  This test is not yet approved or cleared by the [place] FDA and has been authorized for detection and/or diagnosis of SARS-CoV-2 by FDA under an Emergency Use Authorization (EUA). This EUA will remain in effect (meaning this test can be used) for the duration of the COVID-19 declaration under Section 564(b)(1) of the Act, 21 U.S.C. section 360bbb-3(b)(1), unless the authorization is  terminated or revoked.  Performed at [facility], [address], [place], [place] [address]   Blood Culture (routine x 2)     Status: None (Preliminary result)   Collection Time: 04/06/20  5:47 PM   Specimen: BLOOD RIGHT FOREARM  Result Value Ref Range Status   Specimen Description BLOOD RIGHT FOREARM  Final   Special Requests   Final    BOTTLES DRAWN AEROBIC ONLY Blood Culture results may not be optimal due to an inadequate volume of blood received in culture bottles   Culture   Final    NO GROWTH 3 DAYS Performed at [facility], [address], [place], [place] [address]    Report Status PENDING  Incomplete  Blood Culture (routine x 2)     Status: None (Preliminary result)   Collection Time: 04/06/20  7:20 PM   Specimen: BLOOD  Result Value Ref Range Status   Specimen Description BLOOD  Final   Special Requests NONE  Final   Culture   Final    NO GROWTH 3 DAYS Performed at [facility], [address], [place], [place] [address]    Report Status PENDING  Incomplete         Radiology Studies: DG Chest Port 1 View  Result Date: 04/09/2020 CLINICAL DATA:  COVID, hypoxemia EXAM: PORTABLE CHEST 1 VIEW COMPARISON:  04/06/2020 FINDINGS: Cardiomegaly. Unchanged small to moderate left pleural effusion with associated atelectasis or consolidation. No acute appearing airspace opacity. The visualized skeletal structures are unremarkable. IMPRESSION: 1. Unchanged small to moderate left pleural effusion with associated atelectasis or consolidation. No acute appearing airspace opacity. 2. Cardiomegaly. Electronically Signed   By: Eddie Candle M.D.   On: 04/09/2020 12:07   ECHOCARDIOGRAM COMPLETE  Result Date: 04/08/2020    ECHOCARDIOGRAM REPORT   Patient Name:   [name] Date of Exam: 04/08/2020 Medical Rec #:  [id number]      Height:       64.0 in Accession #:    [id number]     Weight:       202.8 lb Date of Birth:  01-22-1924      BSA:  1.968 m Patient Age:    68 years        BP:           92/69 mmHg Patient Gender: F              HR:           100 bpm. Exam Location:  [name] Procedure: 2D Echo, Cardiac Doppler and Color Doppler Indications:    TIA 435.9 / G45.9  History:        Patient has prior history of Echocardiogram examinations, most                 recent 01/10/2020. Stroke, Arrythmias:Atrial Fibrillation; Risk                 Factors:Hypertension. Obesity.  Sonographer:    Alvino Chapel RCS Referring Phys: [id number] [place]  Sonographer Comments: Patient Extremely Confused IMPRESSIONS  1. Left ventricular ejection fraction, by estimation, is 65 to 70%. The left ventricle has normal function. The left ventricle has no regional wall motion abnormalities. There is moderate left ventricular hypertrophy. Left ventricular diastolic parameters are indeterminate.  2. The ventricular septum is flattened in sysotle and diastole consistent with RV pressure and volume overload. . Right ventricular systolic function is moderately reduced. The right ventricular size is severely enlarged.  3. Left atrial size was severely dilated.  4. Right atrial size was severely dilated.  5. The pericardial effusion is circumferential.  6. The mitral valve is normal in structure. Mild mitral valve regurgitation. No evidence of mitral stenosis.  7. Tricuspid valve regurgitation is mild to moderate.  8. The aortic valve is tricuspid. There is mild calcification of the aortic valve. There is mild thickening of the aortic valve. Aortic valve regurgitation is not visualized. No aortic stenosis is present.  9. At least moderate pulmonary HTN, PASP is at least 53 mmHg. Not able to cooperate with IVC evaluation. FINDINGS  Left Ventricle: Left ventricular ejection fraction, by estimation, is 65 to 70%. The left ventricle has normal function. The left ventricle has no regional wall motion abnormalities. The left ventricular internal cavity size was normal in size. There is  moderate left ventricular  hypertrophy. Left ventricular diastolic parameters are indeterminate. Right Ventricle: The ventricular septum is flattened in sysotle and diastole consistent with RV pressure and volume overload. The right ventricular size is severely enlarged. Right vetricular wall thickness was not assessed. Right ventricular systolic function is moderately reduced. Left Atrium: Left atrial size was severely dilated. Right Atrium: Right atrial size was severely dilated. Pericardium: Trivial pericardial effusion is present. The pericardial effusion is circumferential. Mitral Valve: The mitral valve is normal in structure. Mild mitral valve regurgitation. No evidence of mitral valve stenosis. Tricuspid Valve: The tricuspid valve is normal in structure. Tricuspid valve regurgitation is mild to moderate. No evidence of tricuspid stenosis. Aortic Valve: The aortic valve is tricuspid. There is mild calcification of the aortic valve. There is mild thickening of the aortic valve. There is mild aortic valve annular calcification. Aortic valve regurgitation is not visualized. No aortic stenosis  is present. Aortic valve mean gradient measures 2.2 mmHg. Aortic valve peak gradient measures 3.9 mmHg. Aortic valve area, by VTI measures 2.50 cm. Pulmonic Valve: The pulmonic valve was not well visualized. Pulmonic valve regurgitation is mild. No evidence of pulmonic stenosis. Aorta: The aortic root is normal in size and structure. Pulmonary Artery: At least moderate pulmonary HTN, PASP is at least 53 mmHg. Not able  to cooperate with IVC evaluation. IAS/Shunts: No atrial level shunt detected by color flow Doppler.  LEFT VENTRICLE PLAX 2D LVIDd:         3.90 cm  Diastology LVIDs:         2.40 cm  LV e' medial:    7.51 cm/s LV PW:         1.30 cm  LV E/e' medial:  10.7 LV IVS:        1.30 cm  LV e' lateral:   8.59 cm/s LVOT diam:     2.00 cm  LV E/e' lateral: 9.3 LV SV:         35 LV SV Index:   18 LVOT Area:     3.14 cm  RIGHT VENTRICLE RV S  prime:     6.64 cm/s TAPSE (M-mode): 1.3 cm LEFT ATRIUM             Index       RIGHT ATRIUM           Index LA diam:        4.10 cm 2.08 cm/m  RA Area:     25.40 cm LA Vol (A2C):   86.5 ml 43.95 ml/m RA Volume:   82.10 ml  41.72 ml/m LA Vol (A4C):   63.0 ml 31.99 ml/m LA Biplane Vol: 78.9 ml 40.09 ml/m  AORTIC VALVE AV Area (Vmax):    2.20 cm AV Area (Vmean):   1.95 cm AV Area (VTI):     2.50 cm AV Vmax:           99.03 cm/s AV Vmean:          71.406 cm/s AV VTI:            0.141 m AV Peak Grad:      3.9 mmHg AV Mean Grad:      2.2 mmHg LVOT Vmax:         69.50 cm/s LVOT Vmean:        44.300 cm/s LVOT VTI:          0.112 m LVOT/AV VTI ratio: 0.80  AORTA Ao Root diam: 3.00 cm MITRAL VALVE               TRICUSPID VALVE MV Area (PHT): 3.72 cm    TR Peak grad:   44.6 mmHg MV Decel Time: 204 msec    TR Vmax:        334.00 cm/s MV E velocity: 80.05 cm/s MV A velocity: 25.20 cm/s  SHUNTS MV E/A ratio:  3.18        Systemic VTI:  0.11 m                            Systemic Diam: 2.00 cm Carlyle Dolly MD Electronically signed by Carlyle Dolly MD Signature Date/Time: 04/08/2020/1:46:04 PM    Final         Scheduled Meds: .  stroke: mapping our early stages of recovery book   Does not apply Once  . albuterol  2 puff Inhalation BID  . allopurinol  50 mg Oral Daily  . amiodarone  200 mg Oral Daily  . vitamin C  500 mg Oral Daily  . aspirin EC  81 mg Oral Daily  . dexamethasone (DECADRON) injection  6 mg Intravenous Q24H  . divalproex  500 mg Oral BID  . enoxaparin (LOVENOX) injection  30 mg Subcutaneous Q24H  . feeding supplement  237 mL Oral TID BM  . fluticasone  1 puff Inhalation BID  . furosemide  20 mg Intravenous Once  . levETIRAcetam  2,000 mg Oral QHS  . montelukast  10 mg Oral QHS  . multivitamin with minerals  1 tablet Oral Daily  . zinc sulfate  220 mg Oral Daily   Continuous Infusions: . remdesivir 100 mg in NS 100 mL 100 mg (04/09/20 0858)     LOS: 2 days    Time spent:  35 minutes    Casondra Gasca D Manuella Ghazi, DO [facility]  If 7PM-7AM, please contact night-coverage [email or web address] 04/09/2020, 12:44 PM

## 2020-04-10 DIAGNOSIS — U071 COVID-19: Secondary | ICD-10-CM | POA: Diagnosis not present

## 2020-04-10 LAB — COMPREHENSIVE METABOLIC PANEL
ALT: 11 U/L (ref 0–44)
AST: 27 U/L (ref 15–41)
Albumin: 3 g/dL — ABNORMAL LOW (ref 3.5–5.0)
Alkaline Phosphatase: 46 U/L (ref 38–126)
Anion gap: 12 (ref 5–15)
BUN: 42 mg/dL — ABNORMAL HIGH (ref 8–23)
CO2: 20 mmol/L — ABNORMAL LOW (ref 22–32)
Calcium: 9.7 mg/dL (ref 8.9–10.3)
Chloride: 104 mmol/L (ref 98–111)
Creatinine, Ser: 1.38 mg/dL — ABNORMAL HIGH (ref 0.44–1.00)
GFR, Estimated: 35 mL/min — ABNORMAL LOW (ref >60)
Glucose, Bld: 105 mg/dL — ABNORMAL HIGH (ref 70–99)
Potassium: 4.8 mmol/L (ref 3.5–5.1)
Sodium: 136 mmol/L (ref 135–145)
Total Bilirubin: 0.5 mg/dL (ref 0.3–1.2)
Total Protein: 7 g/dL (ref 6.5–8.1)

## 2020-04-10 LAB — CBC WITH DIFFERENTIAL/PLATELET
Abs Immature Granulocytes: 0.83 10*3/uL — ABNORMAL HIGH (ref 0.00–0.07)
Basophils Absolute: 0.1 10*3/uL (ref 0.0–0.1)
Basophils Relative: 1 %
Eosinophils Absolute: 0 10*3/uL (ref 0.0–0.5)
Eosinophils Relative: 0 %
HCT: 33.1 % — ABNORMAL LOW (ref 36.0–46.0)
Hemoglobin: 10.2 g/dL — ABNORMAL LOW (ref 12.0–15.0)
Immature Granulocytes: 6 %
Lymphocytes Relative: 11 %
Lymphs Abs: 1.7 10*3/uL (ref 0.7–4.0)
MCH: 24.3 pg — ABNORMAL LOW (ref 26.0–34.0)
MCHC: 30.8 g/dL (ref 30.0–36.0)
MCV: 78.8 fL — ABNORMAL LOW (ref 80.0–100.0)
Monocytes Absolute: 0.2 10*3/uL (ref 0.1–1.0)
Monocytes Relative: 1 %
Neutro Abs: 12.4 10*3/uL — ABNORMAL HIGH (ref 1.7–7.7)
Neutrophils Relative %: 81 %
Platelets: 502 10*3/uL — ABNORMAL HIGH (ref 150–400)
RBC: 4.2 MIL/uL (ref 3.87–5.11)
RDW: 23.8 % — ABNORMAL HIGH (ref 11.5–15.5)
WBC: 15.2 10*3/uL — ABNORMAL HIGH (ref 4.0–10.5)
nRBC: 8.3 % — ABNORMAL HIGH (ref 0.0–0.2)

## 2020-04-10 LAB — PHOSPHORUS: Phosphorus: 2.2 mg/dL — ABNORMAL LOW (ref 2.5–4.6)

## 2020-04-10 LAB — D-DIMER, QUANTITATIVE: D-Dimer, Quant: 3.13 ug/mL-FEU — ABNORMAL HIGH (ref 0.00–0.50)

## 2020-04-10 LAB — MAGNESIUM: Magnesium: 2.4 mg/dL (ref 1.7–2.4)

## 2020-04-10 LAB — C-REACTIVE PROTEIN: CRP: 2.1 mg/dL — ABNORMAL HIGH (ref <1.0)

## 2020-04-10 LAB — FERRITIN: Ferritin: 35 ng/mL (ref 11–307)

## 2020-04-10 MED ORDER — PHENOL 1.4 % MT LIQD
1.0000 | OROMUCOSAL | Status: DC | PRN
  Administered 2020-04-10 – 2020-04-12 (×2): 1 via OROMUCOSAL
  Filled 2020-04-10: qty 177

## 2020-04-10 NOTE — Progress Notes (Signed)
PROGRESS NOTE    [name]  KWI:[id number] DOB: 01/29/1924 DOA: 04/06/2020 PCP: [name], [name]   Brief Narrative:  [name] a 86 y.o.femalewith medical history significant ofchronic lower back pain, degenerative arthritis, eczema, edema, gout, headache, hiatal hernia, hypertension, internal hemorrhoids, neurogenic claudication, class II obesity, seizure disorder, history of other nonhemorrhagic stroke, urticaria who is coming to the emergency departmentafter her relatives noticed she was havingleft-sided weakness.Stroke work-up has remained negative, however she still remains quite symptomatic from her Covid symptoms and is not eating very well. She still has some periods of confusion. Family members are agreeable to place her in SNF.  Assessment & Plan:   Principal Problem:   2019 novel coronavirus disease (COVID-19) Active Problems:   Hiatal hernia   Seizure disorder (HCC)   Essential hypertension, benign   Acute renal failure superimposed on stage 3 chronic kidney disease (HCC)   Gout   Asthma   Class 2 obesity   Paroxysmal atrial fibrillation (HCC)   Prolonged QT interval   Elevated troponin   Microcytic anemia   Hyperkalemia   Hyponatremia   Left-sided weakness   Pneumonia due to COVID-19 virus   2019 novel coronavirus disease (COVID-19) Continue on RA I-S and flutter valve. Advised to prone as tolerated. Bronchodilators as needed. Acetaminophen as needed. Antitussive/antiemetic as needed Remdesivirongoing Continue dexamethasone 6 mg IVP daily Follow CBC, CMP and inflammatory markers -Patient has now completed remdesivir and is on room air.  Stable for discharge to SNF once bed available.  Left-sided weakness-resolved PT recommending SNF and family members okay with this Hemoglobin A1c5.2% LDL 72 Carotid Dopplerwith minimal plaque 2D echocardiogram as below MRI brain with no acute findings  Prolonged QT  interval Magnesium was supplemented. Avoid QT interval prolonging meds Follow-up EKG in a.m.  Elevated troponin 2D echocardiogram 65 to 70% noted with moderate pericardial effusion and LVH noted  Acute renal failure superimposed on stage 3 chronic kidney disease-improving Hydrated with bolus earlier in the ED. Judicious IV hydration as needed. Follow-up creatinine level in a.m.  Hyperkalemia-resolved Received fluids earlier. Follow-up potassium level in a.m.  Hyponatremia-improving Decreased oral intake? Bilateral lower extremity edema. No cardiomegaly, but small pleural effusion. Follow monitor sodium level.  Hiatal hernia Untreated. Asymptomatic. Meds may prolong QT interval.  Seizure disorder (HCC) Continue Depakote 500 mg p.o. twice daily. Continue Keppra 2000 mg extended release at bedtime.  Essential hypertension, benign Not on antihypertensives. Was hypotensive earlier. Monitor blood pressure.  Gout Continue allopurinol 100 mg p.o. daily.  Asthma Continue Singulair 10 mg p.o. at bedtime. Supplemental oxygen as needed. Bronchodilators as needed.  Class 2 obesity Lifestyle modifications. Follow-up with PCP.  Microcytic anemia Monitor H&H.   DVT prophylaxis:Lovenox Code Status:Full Family Communication:Discussed with son on phone 12/27 Disposition Plan: Status is: Inpatient  Remains inpatient appropriate because:IV treatments appropriate due to intensity of illness or inability to take PO and Inpatient level of care appropriate due to severity of illness   Dispo: The patient is from:Home Anticipated d/c is to:SNF Anticipated d/c date is:1day Patient currently is now medically stable for DC.  Awaiting SNF placement.  Consultants:  None  Procedures:  See below  Antimicrobials:  Anti-infectives (From admission, onward)   Start     Dose/Rate Route  Frequency Ordered Stop   04/07/20 1000  remdesivir 100 mg in sodium chloride 0.9 % 100 mL IVPB        100 mg 200 mL/hr over 30 Minutes Intravenous Daily 04/06/20 1914 04/10/20 1029  04/07/20 0000  vancomycin variable dose per unstable renal function (pharmacist dosing)  Status:  Discontinued         Does not apply See admin instructions 04/06/20 1601 04/07/20 0807   04/06/20 2100  ceFEPIme (MAXIPIME) 2 g in sodium chloride 0.9 % 100 mL IVPB  Status:  Discontinued        2 g 200 mL/hr over 30 Minutes Intravenous Every 24 hours 04/06/20 1658 04/07/20 0807   04/06/20 1915  remdesivir 100 mg in sodium chloride 0.9 % 100 mL IVPB        100 mg 200 mL/hr over 30 Minutes Intravenous Every 30 min 04/06/20 1914 04/06/20 2220   04/06/20 1615  vancomycin (VANCOREADY) IVPB 2000 mg/400 mL        2,000 mg 200 mL/hr over 120 Minutes Intravenous  Once 04/06/20 1604 04/06/20 1838   04/06/20 1500  aztreonam (AZACTAM) 2 g in sodium chloride 0.9 % 100 mL IVPB        2 g 200 mL/hr over 30 Minutes Intravenous  Once 04/06/20 1459 04/06/20 1615   04/06/20 1500  vancomycin (VANCOCIN) IVPB 1000 mg/200 mL premix  Status:  Discontinued        1,000 mg 200 mL/hr over 60 Minutes Intravenous  Once 04/06/20 1459 04/06/20 1552       Subjective: Patient seen and evaluated today with no new acute complaints or concerns. No acute concerns or events noted overnight.  Objective: Vitals:   04/09/20 1948 04/09/20 2029 04/10/20 0631 04/10/20 0720  BP:  106/67 (!) 148/81   Pulse:  (!) 101 100   Resp:  18 18   Temp:  98 F (36.7 C) 98.1 F (36.7 C)   TempSrc:  Oral Oral   SpO2: 91% 90% 95% 95%  Weight:      Height:        Intake/Output Summary (Last 24 hours) at 04/10/2020 1242 Last data filed at 04/10/2020 1004 Gross per 24 hour  Intake 720 ml  Output 1030 ml  Net -310 ml   Filed Weights   04/06/20 1510  Weight: 92 kg    Examination:  General exam: Appears calm and comfortable, obese Respiratory  system: Clear to auscultation. Respiratory effort normal.  Currently on room air. Cardiovascular system: S1 & S2 heard, RRR.  Gastrointestinal system: Abdomen is soft Central nervous system: Alert and awake Extremities: No edema Skin: No significant lesions noted Psychiatry: Flat affect.    Data Reviewed: I have personally reviewed following labs and imaging studies  CBC: Recent Labs  Lab 04/06/20 1430 04/07/20 0503 04/08/20 0528 04/09/20 0706 04/10/20 0950  WBC 10.7* 12.3* 11.8* 13.3* 15.2*  NEUTROABS 8.8* 10.3* 10.2* 11.1* 12.4*  HGB 9.6* 9.9* 9.5* 8.9* 10.2*  HCT 31.1* 32.9* 31.0* 28.8* 33.1*  MCV 79.7* 80.8 79.1* 78.9* 78.8*  PLT 325 404* 392 390 440*   Basic Metabolic Panel: Recent Labs  Lab 04/06/20 1430 04/06/20 1920 04/07/20 0503 04/08/20 0528 04/09/20 0706 04/10/20 0950  NA 132*  --  135 133* 133* 136  K 5.2*  --  4.7 5.0 4.7 4.8  CL 102  --  103 103 105 104  CO2 20*  --  18* 18* 19* 20*  GLUCOSE 100*  --  110* 153* 130* 105*  BUN 19  --  21 32* 40* 42*  CREATININE 1.54*  --  1.39* 1.86* 1.61* 1.38*  CALCIUM 9.3  --  9.4 9.3 9.1 9.7  MG  --  2.1 2.5* 2.4 2.3  2.4  PHOS  --  3.4 4.1 4.2 3.4 2.2*   GFR: Estimated Creatinine Clearance: 27.4 mL/min (A) (by C-G formula based on SCr of 1.38 mg/dL (H)). Liver Function Tests: Recent Labs  Lab 04/06/20 1430 04/07/20 0503 04/08/20 0528 04/09/20 0706 04/10/20 0950  AST 36 20 24 19 27   ALT 8 11 11 11 11   ALKPHOS 48 52 44 43 46  BILITOT 0.8 0.5 0.6 0.5 0.5  PROT 6.7 7.0 6.3* 6.2* 7.0  ALBUMIN 2.7* 2.9* 2.6* 2.6* 3.0*   No results for input(s): LIPASE, AMYLASE in the last 168 hours. No results for input(s): AMMONIA in the last 168 hours. Coagulation Profile: Recent Labs  Lab 04/06/20 1920  INR 1.1   Cardiac Enzymes: No results for input(s): CKTOTAL, CKMB, CKMBINDEX, TROPONINI in the last 168 hours. BNP (last 3 results) No results for input(s): PROBNP in the last 8760 hours. HbA1C: No results for  input(s): HGBA1C in the last 72 hours. CBG: No results for input(s): GLUCAP in the last 168 hours. Lipid Profile: No results for input(s): CHOL, HDL, LDLCALC, TRIG, CHOLHDL, LDLDIRECT in the last 72 hours. Thyroid Function Tests: No results for input(s): TSH, T4TOTAL, FREET4, T3FREE, THYROIDAB in the last 72 hours. Anemia Panel: Recent Labs    04/09/20 0706 04/10/20 0950  FERRITIN 43 35   Sepsis Labs: Recent Labs  Lab 04/06/20 1747 04/06/20 1920 04/07/20 0503 04/09/20 0706  PROCALCITON  --  <0.10 <0.10 0.12  LATICACIDVEN 1.4 2.1* 1.2  --     Recent Results (from the past 240 hour(s))  Urine Culture     Status: Abnormal (Preliminary result)   Collection Time: 04/04/20  4:49 PM   UR  Result Value Ref Range Status   Urine Culture, Routine Preliminary report (A)  Preliminary   Organism ID, Bacteria Gram negative rods (A)  Preliminary    Comment: Greater than 100,000 colony forming units per mL  Microscopic Examination     Status: None   Collection Time: 04/04/20  4:59 PM   Urine  Result Value Ref Range Status   WBC, UA None seen 0 - 5 /hpf Final   RBC None seen 0 - 2 /hpf Final   Epithelial Cells (non renal) CANCELED      Comment: Test not performed  Result canceled by the ancillary.    Bacteria, UA Few None seen/Few Final  Resp Panel by RT-PCR (Flu A&B, Covid) Nasopharyngeal Swab     Status: Abnormal   Collection Time: 04/06/20  2:44 PM   Specimen: Nasopharyngeal Swab; Nasopharyngeal(NP) swabs in vial transport medium  Result Value Ref Range Status   SARS Coronavirus 2 by RT PCR POSITIVE (A) NEGATIVE Final    Comment: RESULT CALLED TO, READ BACK BY AND VERIFIED WITH: HASTINGS,K [id number] ON 04/06/20 BY HUFFINES,S. (NOTE) SARS-CoV-2 target nucleic acids are DETECTED.  The SARS-CoV-2 RNA is generally detectable in upper respiratory specimens during the acute phase of infection. Positive results are indicative of the presence of the identified virus, but do not  rule out bacterial infection or co-infection with other pathogens not detected by the test. Clinical correlation with patient history and other diagnostic information is necessary to determine patient infection status. The expected result is Negative.  Fact Sheet for Patients: EntrepreneurPulse.com.au  Fact Sheet for Healthcare Providers: IncredibleEmployment.be  This test is not yet approved or cleared by the [place] FDA and  has been authorized for detection and/or diagnosis of SARS-CoV-2 by FDA under an Emergency Use Authorization (EUA).  This EUA will remain in effect (meaning this  test can be used) for the duration of  the COVID-19 declaration under Section 564(b)(1) of the Act, 21 U.S.C. section 360bbb-3(b)(1), unless the authorization is terminated or revoked sooner.     Influenza A by PCR NEGATIVE NEGATIVE Final   Influenza B by PCR NEGATIVE NEGATIVE Final    Comment: (NOTE) The Xpert Xpress SARS-CoV-2/FLU/RSV plus assay is intended as an aid in the diagnosis of influenza from Nasopharyngeal swab specimens and should not be used as a sole basis for treatment. Nasal washings and aspirates are unacceptable for Xpert Xpress SARS-CoV-2/FLU/RSV testing.  Fact Sheet for Patients: EntrepreneurPulse.com.au  Fact Sheet for Healthcare Providers: IncredibleEmployment.be  This test is not yet approved or cleared by the [place] FDA and has been authorized for detection and/or diagnosis of SARS-CoV-2 by FDA under an Emergency Use Authorization (EUA). This EUA will remain in effect (meaning this test can be used) for the duration of the COVID-19 declaration under Section 564(b)(1) of the Act, 21 U.S.C. section 360bbb-3(b)(1), unless the authorization is terminated or revoked.  Performed at [facility], [address] [place], [place] [address]   Blood Culture (routine x 2)     Status: None  (Preliminary result)   Collection Time: 04/06/20  5:47 PM   Specimen: BLOOD RIGHT FOREARM  Result Value Ref Range Status   Specimen Description BLOOD RIGHT FOREARM  Final   Special Requests   Final    BOTTLES DRAWN AEROBIC ONLY Blood Culture results may not be optimal due to an inadequate volume of blood received in culture bottles   Culture   Final    NO GROWTH 3 DAYS Performed at [facility], [address], [place], [place] [address]    Report Status PENDING  Incomplete  Blood Culture (routine x 2)     Status: None (Preliminary result)   Collection Time: 04/06/20  7:20 PM   Specimen: BLOOD  Result Value Ref Range Status   Specimen Description BLOOD  Final   Special Requests NONE  Final   Culture   Final    NO GROWTH 3 DAYS Performed at [facility], [address] [place], [place] [address]    Report Status PENDING  Incomplete  Urine culture     Status: None   Collection Time: 04/08/20  6:40 PM   Specimen: In/Out Cath Urine  Result Value Ref Range Status   Specimen Description   Final    IN/OUT CATH URINE Performed at [facility], [address], [place], [place] [address]    Special Requests   Final    NONE Performed at [facility], [address], [place], [place] [address]    Culture   Final    NO GROWTH Performed at [facility], [place] [address], [place], [place] [address]    Report Status 04/09/2020 FINAL  Final         Radiology Studies: DG Chest Port 1 View  Result Date: 04/09/2020 CLINICAL DATA:  COVID, hypoxemia EXAM: PORTABLE CHEST 1 VIEW COMPARISON:  04/06/2020 FINDINGS: Cardiomegaly. Unchanged small to moderate left pleural effusion with associated atelectasis or consolidation. No acute appearing airspace opacity. The visualized skeletal structures are unremarkable. IMPRESSION: 1. Unchanged small to moderate left pleural effusion with associated atelectasis or consolidation. No acute appearing airspace opacity. 2. Cardiomegaly. Electronically  Signed   By: Eddie Candle M.D.   On: 04/09/2020 12:07        Scheduled Meds: .  stroke: mapping our early stages of recovery book   Does not apply Once  . albuterol  2 puff Inhalation BID  . allopurinol  50 mg Oral Daily  . amiodarone  200 mg Oral Daily  . vitamin C  500 mg Oral Daily  . aspirin EC  81 mg Oral Daily  . dexamethasone (DECADRON) injection  6 mg Intravenous Q24H  . divalproex  500 mg Oral BID  . enoxaparin (LOVENOX) injection  30 mg Subcutaneous Q24H  . feeding supplement  237 mL Oral TID BM  . fluticasone  1 puff Inhalation BID  . levETIRAcetam  2,000 mg Oral QHS  . montelukast  10 mg Oral QHS  . multivitamin with minerals  1 tablet Oral Daily  . zinc sulfate  220 mg Oral Daily    LOS: 3 days    Time spent: 35 minutes    [name] Grimmer Darleen Crocker, DO [facility]  If 7PM-7AM, please contact night-coverage [email or web address] 04/10/2020, 12:42 PM

## 2020-04-10 NOTE — Plan of Care (Signed)
  Problem: Education: Goal: Knowledge of disease or condition will improve Outcome: Progressing   Problem: Health Behavior/Discharge Planning: Goal: Ability to manage health-related needs will improve Outcome: Progressing   Problem: Activity: Goal: Risk for activity intolerance will decrease Outcome: Progressing   Problem: Nutrition: Goal: Adequate nutrition will be maintained Outcome: Progressing   Problem: Safety: Goal: Ability to remain free from injury will improve Outcome: Progressing

## 2020-04-11 DIAGNOSIS — U071 COVID-19: Secondary | ICD-10-CM | POA: Diagnosis not present

## 2020-04-11 LAB — CBC WITH DIFFERENTIAL/PLATELET
Band Neutrophils: 2 %
Basophils Absolute: 0 10*3/uL (ref 0.0–0.1)
Basophils Relative: 0 %
Eosinophils Absolute: 0 10*3/uL (ref 0.0–0.5)
Eosinophils Relative: 0 %
HCT: 32 % — ABNORMAL LOW (ref 36.0–46.0)
Hemoglobin: 10.2 g/dL — ABNORMAL LOW (ref 12.0–15.0)
Lymphocytes Relative: 17 %
Lymphs Abs: 2.7 10*3/uL (ref 0.7–4.0)
MCH: 24.8 pg — ABNORMAL LOW (ref 26.0–34.0)
MCHC: 31.9 g/dL (ref 30.0–36.0)
MCV: 77.7 fL — ABNORMAL LOW (ref 80.0–100.0)
Metamyelocytes Relative: 1 %
Monocytes Absolute: 0 10*3/uL — ABNORMAL LOW (ref 0.1–1.0)
Monocytes Relative: 0 %
Myelocytes: 2 %
Neutro Abs: 12.7 10*3/uL — ABNORMAL HIGH (ref 1.7–7.7)
Neutrophils Relative %: 78 %
Platelets: 418 10*3/uL — ABNORMAL HIGH (ref 150–400)
RBC: 4.12 MIL/uL (ref 3.87–5.11)
RDW: 23.8 % — ABNORMAL HIGH (ref 11.5–15.5)
WBC: 15.9 10*3/uL — ABNORMAL HIGH (ref 4.0–10.5)
nRBC: 5.6 % — ABNORMAL HIGH (ref 0.0–0.2)
nRBC: 7 /100 WBC — ABNORMAL HIGH

## 2020-04-11 LAB — CULTURE, BLOOD (ROUTINE X 2)
Culture: NO GROWTH
Culture: NO GROWTH

## 2020-04-11 LAB — PATHOLOGIST SMEAR REVIEW

## 2020-04-11 LAB — COMPREHENSIVE METABOLIC PANEL
ALT: 11 U/L (ref 0–44)
AST: 23 U/L (ref 15–41)
Albumin: 2.7 g/dL — ABNORMAL LOW (ref 3.5–5.0)
Alkaline Phosphatase: 45 U/L (ref 38–126)
Anion gap: 8 (ref 5–15)
BUN: 37 mg/dL — ABNORMAL HIGH (ref 8–23)
CO2: 24 mmol/L (ref 22–32)
Calcium: 9.6 mg/dL (ref 8.9–10.3)
Chloride: 102 mmol/L (ref 98–111)
Creatinine, Ser: 1.23 mg/dL — ABNORMAL HIGH (ref 0.44–1.00)
GFR, Estimated: 41 mL/min — ABNORMAL LOW (ref >60)
Glucose, Bld: 108 mg/dL — ABNORMAL HIGH (ref 70–99)
Potassium: 4.8 mmol/L (ref 3.5–5.1)
Sodium: 134 mmol/L — ABNORMAL LOW (ref 135–145)
Total Bilirubin: 0.3 mg/dL (ref 0.3–1.2)
Total Protein: 6.2 g/dL — ABNORMAL LOW (ref 6.5–8.1)

## 2020-04-11 LAB — D-DIMER, QUANTITATIVE: D-Dimer, Quant: 2.71 ug/mL-FEU — ABNORMAL HIGH (ref 0.00–0.50)

## 2020-04-11 LAB — PHOSPHORUS: Phosphorus: 2.3 mg/dL — ABNORMAL LOW (ref 2.5–4.6)

## 2020-04-11 LAB — FERRITIN: Ferritin: 33 ng/mL (ref 11–307)

## 2020-04-11 LAB — C-REACTIVE PROTEIN: CRP: 1.1 mg/dL — ABNORMAL HIGH (ref <1.0)

## 2020-04-11 LAB — MAGNESIUM: Magnesium: 2.1 mg/dL (ref 1.7–2.4)

## 2020-04-11 MED ORDER — SALINE SPRAY 0.65 % NA SOLN
1.0000 | NASAL | Status: DC | PRN
  Administered 2020-04-11: 13:00:00 1 via NASAL
  Filled 2020-04-11: qty 44

## 2020-04-11 MED ORDER — NYSTATIN 100000 UNIT/GM EX POWD
Freq: Two times a day (BID) | CUTANEOUS | Status: DC
  Filled 2020-04-11 (×3): qty 15

## 2020-04-11 MED ORDER — ALUM & MAG HYDROXIDE-SIMETH 200-200-20 MG/5ML PO SUSP
30.0000 mL | ORAL | Status: DC | PRN
  Administered 2020-04-14: 01:00:00 30 mL via ORAL
  Filled 2020-04-11: qty 30

## 2020-04-11 MED ORDER — MAGIC MOUTHWASH
5.0000 mL | Freq: Three times a day (TID) | ORAL | Status: DC | PRN
  Administered 2020-04-11 – 2020-04-14 (×2): 5 mL via ORAL
  Filled 2020-04-11 (×2): qty 5

## 2020-04-11 NOTE — NC FL2 (Signed)
[place] LEVEL OF CARE SCREENING TOOL     IDENTIFICATION  Patient Name: [name] Birthdate: 06/02/1923 Sex: female Admission Date (Current Location): 04/06/2020  [facility] and [place] Number:  [employer] and Address:  [place] [address] [place]      Provider Number: [id number]  Attending Physician Name and Address:  Rodena Goldmann, DO  Relative Name and Phone Number:  [name] (son/POA) [phone]    Current Level of Care: Hospital Recommended Level of Care: [place] Prior Approval Number:    Date Approved/Denied:   PASRR Number: [id number]  Discharge Plan: SNF    Current Diagnoses: Patient Active Problem List   Diagnosis Date Noted  . Left-sided weakness 04/07/2020  . Grade I diastolic dysfunction 65/99/3570  . Pneumonia due to COVID-19 virus 04/07/2020  . 2019 novel coronavirus disease (COVID-19) 04/06/2020  . Prolonged QT interval 04/06/2020  . Elevated troponin 04/06/2020  . Microcytic anemia 04/06/2020  . Hyperkalemia 04/06/2020  . Hyponatremia 04/06/2020  . Palliative care by specialist   . Goals of care, counseling/discussion   . SVT (supraventricular tachycardia) ([place]) 02/04/2020  . PNA (pneumonia) 02/03/2020  . Atrial fibrillation with RVR ([place]) 01/10/2020  . Paroxysmal atrial fibrillation ([place]) 01/09/2020  . Pneumonia 01/04/2020  . Class 2 obesity 01/04/2020  . CKD (chronic kidney disease) stage 3, GFR 30-59 ml/min (HCC) 01/04/2020  . Nausea vomiting and diarrhea 03/12/2019  . Lung nodules 03/12/2019  . Atypical chest pain 12/31/2018  . Asthma 02/12/2017  . Chest pain 11/13/2016  . History of seizures 11/13/2016  . History of stroke 11/13/2016  . Allergic urticaria 08/30/2016  . GAD (generalized anxiety disorder) 08/30/2016  . Gout 08/30/2016  . Acute renal failure superimposed on stage 3 chronic kidney disease ([place]) 10/11/2015  . Neurogenic claudication ([place])    . Morbid obesity ([place])   . Edema   . Essential hypertension, benign 09/30/2014  . Secondary hyperparathyroidism, renal ([place]) 09/30/2014  . Proteinuria 09/30/2014  . Anemia associated with chronic renal failure 09/30/2014  . Chronic kidney disease (CKD), stage IV (severe) ([place]) 09/30/2014  . Hiatal hernia 08/21/2012  . Seizure disorder ([place]) 08/21/2012    Orientation RESPIRATION BLADDER Height & Weight     Self,Time,Situation,Place  Normal External catheter Weight: 207 lb 3.7 oz (94 kg) Height:  5\' 4"  (162.6 cm)  BEHAVIORAL SYMPTOMS/MOOD NEUROLOGICAL BOWEL NUTRITION STATUS    Convulsions/Seizures Continent Diet (Heart healthy/carb modified)  AMBULATORY STATUS COMMUNICATION OF NEEDS Skin   Extensive Assist Verbally Normal                       Personal Care Assistance Level of Assistance  Bathing,Feeding,Dressing,Total care Bathing Assistance: Limited assistance Feeding assistance: Independent Dressing Assistance: Limited assistance Total Care Assistance: Maximum assistance   Functional Limitations Info  Sight,Hearing,Speech Sight Info: Impaired Hearing Info: Adequate Speech Info: Adequate    SPECIAL CARE FACTORS FREQUENCY  PT (By licensed PT),OT (By licensed OT)     PT Frequency: 5 times weekly OT Frequency: 5 times weekly            Contractures Contractures Info: Not present    Additional Factors Info  Isolation Precautions Code Status Info: FULL Allergies Info: Advicor (Niacin-lovastatin Er); Caduet (Amlodipine-atorvastatin); Codeine; Dilantin (Phenytoin); Lescol (Fluvastatin); Pamelor (Nortriptyline); Penicillins; Sulfa Antibiotics; Trileptal (Oxcarbazepine); Ultram (Tramadol); Welchol (Colesevelam); Latex; Tomato Psychotropic Info: Depakote   Isolation Precautions Info: 04/06/20 COVID+     Current Medications (  04/11/2020):  This is the current hospital active medication list Current Facility-Administered Medications  Medication Dose Route  Frequency Provider Last Rate Last Admin  .  stroke: mapping our early stages of recovery book   Does not apply Once Reubin Milan, MD      . acetaminophen (TYLENOL) tablet 650 mg  650 mg Oral Q6H PRN Reubin Milan, MD       Or  . acetaminophen (TYLENOL) suppository 650 mg  650 mg Rectal Q6H PRN Reubin Milan, MD      . albuterol (VENTOLIN HFA) 108 (90 Base) MCG/ACT inhaler 2 puff  2 puff Inhalation BID Manuella Ghazi, Pratik D, DO   2 puff at 04/11/20 0737  . allopurinol (ZYLOPRIM) tablet 50 mg  50 mg Oral Daily Reubin Milan, MD   50 mg at 04/11/20 0847  . alum & mag hydroxide-simeth (MAALOX/MYLANTA) 200-200-20 MG/5ML suspension 30 mL  30 mL Oral Q4H PRN Manuella Ghazi, Pratik D, DO      . amiodarone (PACERONE) tablet 200 mg  200 mg Oral Daily Reubin Milan, MD   200 mg at 04/11/20 0848  . ascorbic acid (VITAMIN C) tablet 500 mg  500 mg Oral Daily Reubin Milan, MD   500 mg at 04/11/20 0847  . aspirin EC tablet 81 mg  81 mg Oral Daily Reubin Milan, MD   81 mg at 04/11/20 0846  . dexamethasone (DECADRON) injection 6 mg  6 mg Intravenous Q24H Reubin Milan, MD   6 mg at 04/10/20 1933  . divalproex (DEPAKOTE) DR tablet 500 mg  500 mg Oral BID Reubin Milan, MD   500 mg at 04/11/20 0847  . enoxaparin (LOVENOX) injection 30 mg  30 mg Subcutaneous Q24H Reubin Milan, MD   30 mg at 04/10/20 1933  . feeding supplement (ENSURE ENLIVE / ENSURE PLUS) liquid 237 mL  237 mL Oral TID BM Shah, Pratik D, DO   237 mL at 04/11/20 0848  . fluticasone (FLOVENT HFA) 110 MCG/ACT inhaler 1 puff  1 puff Inhalation BID Reubin Milan, MD   1 puff at 04/11/20 [phone]  . guaiFENesin-dextromethorphan (ROBITUSSIN DM) 100-10 MG/5ML syrup 10 mL  10 mL Oral Q4H PRN Reubin Milan, MD      . levETIRAcetam (KEPPRA XR) 24 hr tablet 2,000 mg  2,000 mg Oral QHS Reubin Milan, MD   2,000 mg at 04/10/20 2152  . montelukast (SINGULAIR) tablet 10 mg  10 mg Oral QHS Reubin Milan, MD   10 mg at 04/10/20 2152  . multivitamin with minerals tablet 1 tablet  1 tablet Oral Daily Manuella Ghazi, Pratik D, DO   1 tablet at 04/11/20 0847  . phenol (CHLORASEPTIC) mouth spray 1 spray  1 spray Mouth/Throat PRN Heath Lark D, DO   1 spray at 04/10/20 2153  . polyethylene glycol (MIRALAX / GLYCOLAX) packet 17 g  17 g Oral Daily PRN Reubin Milan, MD      . polyvinyl alcohol (LIQUIFILM TEARS) 1.4 % ophthalmic solution 1 drop  1 drop Right Eye PRN Manuella Ghazi, Pratik D, DO   1 drop at 04/08/20 0559  . prochlorperazine (COMPAZINE) injection 5 mg  5 mg Intravenous Q4H PRN Reubin Milan, MD      . zinc sulfate capsule 220 mg  220 mg Oral Daily Reubin Milan, MD   220 mg at 04/11/20 6948     Discharge Medications: Please see discharge summary for a list of  discharge medications.  Relevant Imaging Results:  Relevant Lab Results:   Additional Information SSN: [id number]  Ihor Gully, LCSW

## 2020-04-11 NOTE — Progress Notes (Signed)
PROGRESS NOTE    [name]  WUJ:[id number] DOB: [birth year] DOA: 04/06/2020 PCP: [name], [name]   Brief Narrative:  [name] a 86 y.o.femalewith medical history significant ofchronic lower back pain, degenerative arthritis, eczema, edema, gout, headache, hiatal hernia, hypertension, internal hemorrhoids, neurogenic claudication, class II obesity, seizure disorder, history of other nonhemorrhagic stroke, urticaria who is coming to the emergency departmentafter her relatives noticed she was havingleft-sided weakness.Stroke work-up has remained negative, however she still remains quite symptomatic from her Covid symptoms and is not eating very well. She still has some periods of confusion. Family members are agreeable to place her in SNF.  Patient is awaiting bed placement.  Assessment & Plan:   Principal Problem:   2019 novel coronavirus disease (COVID-19) Active Problems:   Hiatal hernia   Seizure disorder (HCC)   Essential hypertension, benign   Acute renal failure superimposed on stage 3 chronic kidney disease (HCC)   Gout   Asthma   Class 2 obesity   Paroxysmal atrial fibrillation (HCC)   Prolonged QT interval   Elevated troponin   Microcytic anemia   Hyperkalemia   Hyponatremia   Left-sided weakness   Pneumonia due to COVID-19 virus   2019 novel coronavirus disease (COVID-19) Continue on RA I-S and flutter valve. Advised to prone as tolerated. Bronchodilators as needed. Acetaminophen as needed. Antitussive/antiemetic as needed Remdesivirongoing Continue dexamethasone 6 mg IVP daily Follow CBC, CMP and inflammatory markers -Patient has now completed remdesivir and is on room air.  Stable for discharge to SNF once bed available.  Left-sided weakness-resolved PT recommending SNFand family members okay with this Hemoglobin A1c5.2% LDL 72 Carotid Dopplerwith minimal plaque 2D echocardiogram as below MRI brain with no acute  findings  Prolonged QT interval Magnesium was supplemented. Avoid QT interval prolonging meds Follow-up EKG in a.m.  Elevated troponin 2D echocardiogram 65 to 70% noted with moderate pericardial effusion and LVH noted  Acute renal failure superimposed on stage 3 chronic kidney disease-improving Hydrated with bolus earlier in the ED. Judicious IV hydration as needed. Follow-up creatinine level in a.m.  Hyperkalemia-resolved Received fluids earlier. Follow-up potassium level in a.m.  Hyponatremia-improving Decreased oral intake? Bilateral lower extremity edema. No cardiomegaly, but small pleural effusion. Follow monitor sodium level.  Hiatal hernia Untreated. Asymptomatic. Meds may prolong QT interval.  Seizure disorder (HCC) Continue Depakote 500 mg p.o. twice daily. Continue Keppra 2000 mg extended release at bedtime.  Essential hypertension, benign Not on antihypertensives. Was hypotensive earlier. Monitor blood pressure.  Gout Continue allopurinol 100 mg p.o. daily.  Asthma Continue Singulair 10 mg p.o. at bedtime. Supplemental oxygen as needed. Bronchodilators as needed.  Class 2 obesity Lifestyle modifications. Follow-up with PCP.  Microcytic anemia Monitor H&H.   DVT prophylaxis:Lovenox Code Status:Full Family Communication:Discussed with son on phone 12/27 Disposition Plan: Status is: Inpatient  Remains inpatient appropriate because:IV treatments appropriate due to intensity of illness or inability to take PO and Inpatient level of care appropriate due to severity of illness   Dispo: The patient is from:Home Anticipated d/c is to:SNF Anticipated d/c date is:1day Patient currently is now medically stable for DC.  Awaiting SNF placement.  Consultants:  None  Procedures:  See below  Antimicrobials:  Anti-infectives (From admission, onward)    Start     Dose/Rate Route Frequency Ordered Stop   04/07/20 1000  remdesivir 100 mg in sodium chloride 0.9 % 100 mL IVPB        100 mg 200 mL/hr over 30 Minutes Intravenous  Daily 04/06/20 1914 04/10/20 1029   04/07/20 0000  vancomycin variable dose per unstable renal function (pharmacist dosing)  Status:  Discontinued         Does not apply See admin instructions 04/06/20 1601 04/07/20 0807   04/06/20 2100  ceFEPIme (MAXIPIME) 2 g in sodium chloride 0.9 % 100 mL IVPB  Status:  Discontinued        2 g 200 mL/hr over 30 Minutes Intravenous Every 24 hours 04/06/20 1658 04/07/20 0807   04/06/20 1915  remdesivir 100 mg in sodium chloride 0.9 % 100 mL IVPB        100 mg 200 mL/hr over 30 Minutes Intravenous Every 30 min 04/06/20 1914 04/06/20 2220   04/06/20 1615  vancomycin (VANCOREADY) IVPB 2000 mg/400 mL        2,000 mg 200 mL/hr over 120 Minutes Intravenous  Once 04/06/20 1604 04/06/20 1838   04/06/20 1500  aztreonam (AZACTAM) 2 g in sodium chloride 0.9 % 100 mL IVPB        2 g 200 mL/hr over 30 Minutes Intravenous  Once 04/06/20 1459 04/06/20 1615   04/06/20 1500  vancomycin (VANCOCIN) IVPB 1000 mg/200 mL premix  Status:  Discontinued        1,000 mg 200 mL/hr over 60 Minutes Intravenous  Once 04/06/20 1459 04/06/20 1552       Subjective: Patient seen and evaluated today with no new acute complaints or concerns. No acute concerns or events noted overnight. She is complaining of some nasal congestion and dry mouth.  Objective: Vitals:   04/10/20 1930 04/11/20 0500 04/11/20 0523 04/11/20 0822  BP: 133/67  (!) 150/78   Pulse: 88  91   Resp: 18  18   Temp: 98 F (36.7 C)  97.7 F (36.5 C)   TempSrc: Oral  Oral   SpO2: 90%  92% 95%  Weight:  94 kg    Height:        Intake/Output Summary (Last 24 hours) at 04/11/2020 1228 Last data filed at 04/11/2020 1200 Gross per 24 hour  Intake 50 ml  Output 2200 ml  Net -2150 ml   Filed Weights   04/06/20 1510 04/11/20 0500  Weight:  92 kg 94 kg    Examination:  General exam: Appears calm and comfortable  Respiratory system: Clear to auscultation. Respiratory effort normal. Cardiovascular system: S1 & S2 heard, RRR.  Gastrointestinal system: Abdomen is soft Central nervous system: Alert and awake Extremities: No edema Skin: No significant lesions noted Psychiatry: Flat affect.    Data Reviewed: I have personally reviewed following labs and imaging studies  CBC: Recent Labs  Lab 04/07/20 0503 04/08/20 0528 04/09/20 0706 04/10/20 0950 04/11/20 0656  WBC 12.3* 11.8* 13.3* 15.2* 15.9*  NEUTROABS 10.3* 10.2* 11.1* 12.4* 12.7*  HGB 9.9* 9.5* 8.9* 10.2* 10.2*  HCT 32.9* 31.0* 28.8* 33.1* 32.0*  MCV 80.8 79.1* 78.9* 78.8* 77.7*  PLT 404* 392 390 502* 222*   Basic Metabolic Panel: Recent Labs  Lab 04/07/20 0503 04/08/20 0528 04/09/20 0706 04/10/20 0950 04/11/20 0656  NA 135 133* 133* 136 134*  K 4.7 5.0 4.7 4.8 4.8  CL 103 103 105 104 102  CO2 18* 18* 19* 20* 24  GLUCOSE 110* 153* 130* 105* 108*  BUN 21 32* 40* 42* 37*  CREATININE 1.39* 1.86* 1.61* 1.38* 1.23*  CALCIUM 9.4 9.3 9.1 9.7 9.6  MG 2.5* 2.4 2.3 2.4 2.1  PHOS 4.1 4.2 3.4 2.2* 2.3*   GFR: Estimated Creatinine  Clearance: 31.1 mL/min (A) (by C-G formula based on SCr of 1.23 mg/dL (H)). Liver Function Tests: Recent Labs  Lab 04/07/20 0503 04/08/20 0528 04/09/20 0706 04/10/20 0950 04/11/20 0656  AST 20 24 19 27 23   ALT 11 11 11 11 11   ALKPHOS 52 44 43 46 45  BILITOT 0.5 0.6 0.5 0.5 0.3  PROT 7.0 6.3* 6.2* 7.0 6.2*  ALBUMIN 2.9* 2.6* 2.6* 3.0* 2.7*   No results for input(s): LIPASE, AMYLASE in the last 168 hours. No results for input(s): AMMONIA in the last 168 hours. Coagulation Profile: Recent Labs  Lab 04/06/20 1920  INR 1.1   Cardiac Enzymes: No results for input(s): CKTOTAL, CKMB, CKMBINDEX, TROPONINI in the last 168 hours. BNP (last 3 results) No results for input(s): PROBNP in the last 8760 hours. HbA1C: No  results for input(s): HGBA1C in the last 72 hours. CBG: No results for input(s): GLUCAP in the last 168 hours. Lipid Profile: No results for input(s): CHOL, HDL, LDLCALC, TRIG, CHOLHDL, LDLDIRECT in the last 72 hours. Thyroid Function Tests: No results for input(s): TSH, T4TOTAL, FREET4, T3FREE, THYROIDAB in the last 72 hours. Anemia Panel: Recent Labs    04/10/20 0950 04/11/20 0656  FERRITIN 35 33   Sepsis Labs: Recent Labs  Lab 04/06/20 1747 04/06/20 1920 04/07/20 0503 04/09/20 0706  PROCALCITON  --  <0.10 <0.10 0.12  LATICACIDVEN 1.4 2.1* 1.2  --     Recent Results (from the past 240 hour(s))  Urine Culture     Status: Abnormal (Preliminary result)   Collection Time: 04/04/20  4:49 PM   UR  Result Value Ref Range Status   Urine Culture, Routine Preliminary report (A)  Preliminary   Organism ID, Bacteria Gram negative rods (A)  Preliminary    Comment: Greater than 100,000 colony forming units per mL  Microscopic Examination     Status: None   Collection Time: 04/04/20  4:59 PM   Urine  Result Value Ref Range Status   WBC, UA None seen 0 - 5 /hpf Final   RBC None seen 0 - 2 /hpf Final   Epithelial Cells (non renal) CANCELED      Comment: Test not performed  Result canceled by the ancillary.    Bacteria, UA Few None seen/Few Final  Resp Panel by RT-PCR (Flu A&B, Covid) Nasopharyngeal Swab     Status: Abnormal   Collection Time: 04/06/20  2:44 PM   Specimen: Nasopharyngeal Swab; Nasopharyngeal(NP) swabs in vial transport medium  Result Value Ref Range Status   SARS Coronavirus 2 by RT PCR POSITIVE (A) NEGATIVE Final    Comment: RESULT CALLED TO, READ BACK BY AND VERIFIED WITH: HASTINGS,K [id number] ON 04/06/20 BY HUFFINES,S. (NOTE) SARS-CoV-2 target nucleic acids are DETECTED.  The SARS-CoV-2 RNA is generally detectable in upper respiratory specimens during the acute phase of infection. Positive results are indicative of the presence of the identified virus, but do  not rule out bacterial infection or co-infection with other pathogens not detected by the test. Clinical correlation with patient history and other diagnostic information is necessary to determine patient infection status. The expected result is Negative.  Fact Sheet for Patients: EntrepreneurPulse.com.au  Fact Sheet for Healthcare Providers: IncredibleEmployment.be  This test is not yet approved or cleared by the [place] FDA and  has been authorized for detection and/or diagnosis of SARS-CoV-2 by FDA under an Emergency Use Authorization (EUA).  This EUA will remain in effect (meaning this  test can be used) for the duration  of  the COVID-19 declaration under Section 564(b)(1) of the Act, 21 U.S.C. section 360bbb-3(b)(1), unless the authorization is terminated or revoked sooner.     Influenza A by PCR NEGATIVE NEGATIVE Final   Influenza B by PCR NEGATIVE NEGATIVE Final    Comment: (NOTE) The Xpert Xpress SARS-CoV-2/FLU/RSV plus assay is intended as an aid in the diagnosis of influenza from Nasopharyngeal swab specimens and should not be used as a sole basis for treatment. Nasal washings and aspirates are unacceptable for Xpert Xpress SARS-CoV-2/FLU/RSV testing.  Fact Sheet for Patients: EntrepreneurPulse.com.au  Fact Sheet for Healthcare Providers: IncredibleEmployment.be  This test is not yet approved or cleared by the [place] FDA and has been authorized for detection and/or diagnosis of SARS-CoV-2 by FDA under an Emergency Use Authorization (EUA). This EUA will remain in effect (meaning this test can be used) for the duration of the COVID-19 declaration under Section 564(b)(1) of the Act, 21 U.S.C. section 360bbb-3(b)(1), unless the authorization is terminated or revoked.  Performed at [facility], [address], [place], [place] [address]   Blood Culture (routine x 2)     Status: None  (Preliminary result)   Collection Time: 04/06/20  5:47 PM   Specimen: BLOOD RIGHT FOREARM  Result Value Ref Range Status   Specimen Description BLOOD RIGHT FOREARM  Final   Special Requests   Final    BOTTLES DRAWN AEROBIC ONLY Blood Culture results may not be optimal due to an inadequate volume of blood received in culture bottles   Culture   Final    NO GROWTH 4 DAYS Performed at [facility], [address] [place], [place] [address]    Report Status PENDING  Incomplete  Blood Culture (routine x 2)     Status: None (Preliminary result)   Collection Time: 04/06/20  7:20 PM   Specimen: BLOOD  Result Value Ref Range Status   Specimen Description BLOOD  Final   Special Requests NONE  Final   Culture   Final    NO GROWTH 4 DAYS Performed at [facility], [address], [place], [place] [address]    Report Status PENDING  Incomplete  Urine culture     Status: None   Collection Time: 04/08/20  6:40 PM   Specimen: In/Out Cath Urine  Result Value Ref Range Status   Specimen Description   Final    IN/OUT CATH URINE Performed at [facility], [address], [place], [place] [address]    Special Requests   Final    NONE Performed at [facility], [address] [place], [place] [address]    Culture   Final    NO GROWTH Performed at [facility], [place] [address] [place], [place] [address]    Report Status 04/09/2020 FINAL  Final         Radiology Studies: No results found.      Scheduled Meds: .  stroke: mapping our early stages of recovery book   Does not apply Once  . albuterol  2 puff Inhalation BID  . allopurinol  50 mg Oral Daily  . amiodarone  200 mg Oral Daily  . vitamin C  500 mg Oral Daily  . aspirin EC  81 mg Oral Daily  . dexamethasone (DECADRON) injection  6 mg Intravenous Q24H  . divalproex  500 mg Oral BID  . enoxaparin (LOVENOX) injection  30 mg Subcutaneous Q24H  . feeding supplement  237 mL Oral TID BM  . fluticasone  1 puff Inhalation BID   . levETIRAcetam  2,000 mg Oral QHS  . montelukast  10 mg Oral QHS  . multivitamin with minerals  1 tablet Oral Daily  . nystatin   Topical BID  . zinc sulfate  220 mg Oral Daily    LOS: 4 days    Time spent: 35 minutes    [place] Darleen Crocker, DO [facility]  If 7PM-7AM, please contact night-coverage [email or web address] 04/11/2020, 12:28 PM

## 2020-04-11 NOTE — Progress Notes (Signed)
Physical Therapy Treatment Patient Details Name: [name] MRN: [id number] DOB: 09-22-1923 Today's Date: 04/11/2020    History of Present Illness [name] is a 86 y.o. female with medical history significant of chronic lower back pain, degenerative arthritis, eczema, edema, gout, headache, hiatal hernia, hypertension, internal hemorrhoids, neurogenic claudication, class II obesity, seizure disorder, history of other nonhemorrhagic stroke, urticaria who is coming to the emergency department after her relatives noticed she was having left-sided weakness.  Her daughter told Dr. Eulis Foster that she has not been eating or drinking much recently.  She has also been complaining of generalized weakness to her daughter for the past several days.  She denies fever, but complains of chills, fatigue, dyspnea and nonproductive cough.  No abdominal pain, nausea or vomiting, diarrhea, constipation, melena or hematochezia.  No dysuria, frequency or hematuria.  No polyuria, polydipsia, polyphagia or blurred vision.  She is unvaccinated against coronavirus.    PT Comments    Patient demonstrates increased endurance/distance for taking steps in room without loss of balance, limited mostly due to fatigue and tends to lean over RW due to generalized weakness, able to transfer to [facility] prior to gait training and tolerated sitting up in chair after therapy - RN/NT notified.  Patient will benefit from continued physical therapy in hospital and recommended venue below to increase strength, balance, endurance for safe ADLs and gait.    Follow Up Recommendations  SNF;Supervision for mobility/OOB;Supervision - Intermittent     Equipment Recommendations  None recommended by PT    Recommendations for Other Services       Precautions / Restrictions Precautions Precautions: Fall Restrictions Weight Bearing Restrictions: No    Mobility  Bed Mobility Overal bed mobility: Needs Assistance Bed Mobility: Supine to  Sit     Supine to sit: Supervision;Min guard     General bed mobility comments: slightly labored movement  Transfers Overall transfer level: Needs assistance Equipment used: Rolling walker (2 wheeled) Transfers: Sit to/from [facility] Sit to Stand: Min guard;Min assist Stand pivot transfers: Min assist       General transfer comment: slow labored movement, frequent verbal/tactile cueing for proper hand placement during sit to stands/stands to sit  Ambulation/Gait Ambulation/Gait assistance: Min assist Gait Distance (Feet): 25 Feet Assistive device: Rolling walker (2 wheeled) Gait Pattern/deviations: Decreased step length - right;Decreased step length - left;Decreased stride length;Trunk flexed Gait velocity: decreased   General Gait Details: demonstrates increased endurance/distance for ambulation without loss of balance, limited secondary to fatigue   Stairs             Wheelchair Mobility    Modified Rankin (Stroke Patients Only)       Balance Overall balance assessment: Needs assistance Sitting-balance support: Feet unsupported;No upper extremity supported Sitting balance-Leahy Scale: Fair Sitting balance - Comments: fair/good seated at EOB   Standing balance support: During functional activity;Bilateral upper extremity supported Standing balance-Leahy Scale: Fair Standing balance comment: using RW                            Cognition Arousal/Alertness: Awake/alert Behavior During Therapy: WFL for tasks assessed/performed Overall Cognitive Status: Within Functional Limits for tasks assessed                                        Exercises General Exercises - Lower Extremity Long Arc Quad:  Seated;AROM;Strengthening;Both;10 reps Hip Flexion/Marching: Seated;AROM;Strengthening;Both;10 reps Toe Raises: Seated;AROM;Strengthening;Both;10 reps Heel Raises: Seated;AROM;Strengthening;Both;10 reps    General  Comments        Pertinent Vitals/Pain Pain Assessment: Faces Faces Pain Scale: Hurts a little bit Pain Location: BUE Pain Descriptors / Indicators: Sore;Aching Pain Intervention(s): Limited activity within patient's tolerance;Monitored during session    Home Living                      Prior Function            PT Goals (current goals can now be found in the care plan section) Acute Rehab PT Goals Patient Stated Goal: return home with family, caregivers, neighbors to assist PT Goal Formulation: With patient Time For Goal Achievement: 04/21/20 Potential to Achieve Goals: Good Progress towards PT goals: Progressing toward goals    Frequency    Min 3X/week      PT Plan Current plan remains appropriate    Co-evaluation              AM-PAC PT "6 Clicks" Mobility   Outcome Measure  Help needed turning from your back to your side while in a flat bed without using bedrails?: None Help needed moving from lying on your back to sitting on the side of a flat bed without using bedrails?: A Little Help needed moving to and from a bed to a chair (including a wheelchair)?: A Little Help needed standing up from a chair using your arms (e.g., wheelchair or bedside chair)?: A Little Help needed to walk in hospital room?: A Little Help needed climbing 3-5 steps with a railing? : A Lot 6 Click Score: 18    End of Session   Activity Tolerance: Patient tolerated treatment well;Patient limited by fatigue Patient left: in chair;with call bell/phone within reach;with chair alarm set Nurse Communication: Mobility status PT Visit Diagnosis: Unsteadiness on feet (R26.81);Other abnormalities of gait and mobility (R26.89);Muscle weakness (generalized) (M62.81)     Time: 4680-3212 PT Time Calculation (min) (ACUTE ONLY): 30 min  Charges:  $Therapeutic Exercise: 8-22 mins $Therapeutic Activity: 8-22 mins                     4:12 PM, 04/11/20 Lonell Grandchild, MPT Physical  Therapist with [facility] [phone] office [phone] mobile phone

## 2020-04-11 NOTE — TOC Progression Note (Signed)
Transition of Care [facility]) - Progression Note    Patient Details  Name: [name] MRN: [id number] Date of Birth: September 27, 1923  [facility] CM/SW Contact  Ihor Gully, LCSW Phone Number: 04/11/2020, 1:36 PM  Clinical Narrative:   [place] the only area [place] [facility] accepting facility in [place] has no female beds. A message for son, POA was left. Spoke with daughter about lack of areas facilities accepting COVID patients.  Provided daughter with other options for COVID SNFs however none are in the area ([place], [facility], [place]). Daughter says they will have to think about facilities in distant areas and let TOC know but right now she would say "no" to going that far. She said son is at work right now.     Expected Discharge Plan: [place] Barriers to Discharge: Continued Medical Work up  Expected Discharge Plan and Services Expected Discharge Plan: [place] arrangements for the past 2 months: Single Family Home                                       Social Determinants of Health (SDOH) Interventions    Readmission Risk Interventions Readmission Risk Prevention Plan 02/08/2020 01/07/2020  Transportation Screening Complete Complete  PCP or Specialist Appt within 5-7 Days - Complete  Home Care Screening - Complete  Medication Review (RN CM) - Complete  HRI or Home Care Consult Complete -  Social Work Consult for Recovery Care Planning/Counseling Complete -  Palliative Care Screening Complete -  Medication Review Press photographer) Complete -  Some recent data might be hidden

## 2020-04-12 DIAGNOSIS — U071 COVID-19: Secondary | ICD-10-CM | POA: Diagnosis not present

## 2020-04-12 MED ORDER — ADULT MULTIVITAMIN W/MINERALS CH: 1.0000 | ORAL_TABLET | Freq: Every day | ORAL | 0 refills | Status: AC

## 2020-04-12 MED ORDER — ASCORBIC ACID 500 MG PO TABS: 500.0000 mg | ORAL_TABLET | Freq: Every day | ORAL | 0 refills | Status: AC

## 2020-04-12 MED ORDER — NYSTATIN 100000 UNIT/ML MT SUSP
5.0000 mL | Freq: Four times a day (QID) | OROMUCOSAL | Status: DC
  Administered 2020-04-12 – 2020-04-14 (×7): 500000 [IU] via ORAL
  Filled 2020-04-12 (×7): qty 5

## 2020-04-12 MED ORDER — NYSTATIN 100000 UNIT/GM EX POWD: Freq: Two times a day (BID) | CUTANEOUS | 0 refills | Status: AC

## 2020-04-12 MED ORDER — ZINC SULFATE 220 (50 ZN) MG PO CAPS: 220.0000 mg | ORAL_CAPSULE | Freq: Every day | ORAL | 0 refills | Status: AC

## 2020-04-12 MED ORDER — GUAIFENESIN-DM 100-10 MG/5ML PO SYRP: 10.0000 mL | ORAL_SOLUTION | ORAL | 0 refills | Status: AC | PRN

## 2020-04-12 MED ORDER — POLYVINYL ALCOHOL 1.4 % OP SOLN: 1.0000 [drp] | OPHTHALMIC | 0 refills | Status: AC | PRN

## 2020-04-12 MED ORDER — PHENOL 1.4 % MT LIQD: 1.0000 | OROMUCOSAL | 0 refills | Status: AC | PRN

## 2020-04-12 MED ORDER — SALINE SPRAY 0.65 % NA SOLN: 1.0000 | NASAL | 0 refills | Status: AC | PRN

## 2020-04-12 MED ORDER — ALBUTEROL SULFATE HFA 108 (90 BASE) MCG/ACT IN AERS: 2.0000 | INHALATION_SPRAY | Freq: Four times a day (QID) | RESPIRATORY_TRACT | 0 refills | Status: AC | PRN

## 2020-04-12 MED ORDER — ENOXAPARIN SODIUM 40 MG/0.4ML ~~LOC~~ SOLN
40.0000 mg | SUBCUTANEOUS | Status: DC
  Administered 2020-04-12 – 2020-04-13 (×2): 40 mg via SUBCUTANEOUS
  Filled 2020-04-12 (×2): qty 0.4

## 2020-04-12 MED ORDER — ENSURE ENLIVE PO LIQD: 237.0000 mL | Freq: Three times a day (TID) | ORAL | 12 refills | Status: AC

## 2020-04-12 NOTE — Discharge Summary (Signed)
Physician Discharge Summary  [name] KYH:[id number] DOB: 1923/07/25 DOA: 04/06/2020  PCP: [name], [name]  Admit date: 04/06/2020  Discharge date: 04/12/2020  Admitted From:Home  Disposition:  SNF  Recommendations for Outpatient Follow-up:  1. Follow up with PCP in 1-2 weeks 2. Continue medications as noted below 3. Hold further use of steroids due to oral thrush 4. Patient has completed course of remdesivir treatment 5. Continue on other medications as noted below  Home Health: None  Equipment/Devices: None  Discharge Condition:Stable  CODE STATUS: Full  Diet recommendation: Heart Healthy  Brief/Interim Summary: [name] a 86 y.o.femalewith medical history significant ofchronic lower back pain, degenerative arthritis, eczema, edema, gout, headache, hiatal hernia, hypertension, internal hemorrhoids, neurogenic claudication, class II obesity, seizure disorder, history of other nonhemorrhagic stroke, urticaria who is coming to the emergency departmentafter her relatives noticed she was havingleft-sided weakness.Stroke work-up has remained negative, and she remained quite symptomatic with poor appetite for several days, but this has slowly improved. Some periods of confusion during her stay which have now improved and she is now at baseline. Family members are agreeable to place her in SNF.   -Patient noted to have some oral thrush for which nystatin swish and swallow have been ordered and further steroids have been discontinued.  She has remained on room air with no further respiratory symptoms and has completed her course of remdesivir while hospitalized.  She is requiring rehabilitation on discharge per PT assessment and is appropriate for discharge at this time with no other acute events noted throughout the course of this admission.  Discharge Diagnoses:  Principal Problem:   2019 novel coronavirus disease (COVID-19) Active Problems:   Hiatal  hernia   Seizure disorder (HCC)   Essential hypertension, benign   Acute renal failure superimposed on stage 3 chronic kidney disease (HCC)   Gout   Asthma   Class 2 obesity   Paroxysmal atrial fibrillation (HCC)   Prolonged QT interval   Elevated troponin   Microcytic anemia   Hyperkalemia   Hyponatremia   Left-sided weakness   Pneumonia due to COVID-19 virus  Principal discharge diagnosis: Weakness secondary to COVID-19 pneumonia.  Discharge Instructions   Allergies as of 04/12/2020      Reactions   Advicor [niacin-lovastatin Er] Other (See Comments)   Reaction unknown to daughter   Caduet [amlodipine-atorvastatin] Other (See Comments)   Reaction unknown to daughter   Codeine Other (See Comments)   Reaction unknown to daughter   Dilantin [phenytoin] Other (See Comments)   Reaction unknown to daughter   Lescol [fluvastatin] Other (See Comments)   Reaction unknown to daughter   Pamelor [nortriptyline] Other (See Comments)   Reaction unknown to daughter   Penicillins Hives   Did it involve swelling of the face/tongue/throat, SOB, or low BP? Unk Did it involve sudden or severe rash/hives, skin peeling, or any reaction on the inside of your mouth or nose? Unk Did you need to seek medical attention at a hospital or doctor's office? Unk When did it last happen? "it was a long time ago" If all above answers are "NO", may proceed with cephalosporin use.   Sulfa Antibiotics Hives   Trileptal [oxcarbazepine] Other (See Comments)   Reaction unknown to daughter   Ultram [tramadol] Other (See Comments)   Reaction unknown to daughter   Welchol [colesevelam] Other (See Comments)   Reaction unknown to daughter   Latex Rash   Tomato Rash      Medication List  TAKE these medications   acetaminophen 500 MG tablet Commonly known as: TYLENOL Take 500-1,000 mg by mouth every 8 (eight) hours as needed for mild pain or headache.   albuterol 108 (90 Base) MCG/ACT  inhaler Commonly known as: VENTOLIN HFA Inhale 2 puffs into the lungs every 6 (six) hours as needed for wheezing or shortness of breath.   allopurinol 100 MG tablet Commonly known as: ZYLOPRIM TAKE 1/2 TABLET BY MOUTH EVERY DAY   amiodarone 200 MG tablet Commonly known as: PACERONE Take 1 tablet (200 mg total) by mouth daily.   Arnuity Ellipta 200 MCG/ACT Aepb Generic drug: Fluticasone Furoate INHALE 1 PUFF BY MOUTH EVERY DAY What changed:   how much to take  how to take this  when to take this  additional instructions   ascorbic acid 500 MG tablet Commonly known as: VITAMIN C Take 1 tablet (500 mg total) by mouth daily for 15 days. Start taking on: April 13, 2020   aspirin 81 MG tablet Take 81 mg by mouth daily.   divalproex 250 MG DR tablet Commonly known as: DEPAKOTE TAKE 2 TABLETS BY MOUTH 2 TIMES DAILY.   feeding supplement Liqd Take 237 mLs by mouth 3 (three) times daily between meals.   guaiFENesin-dextromethorphan 100-10 MG/5ML syrup Commonly known as: ROBITUSSIN DM Take 10 mLs by mouth every 4 (four) hours as needed for cough.   levETIRAcetam 500 MG 24 hr tablet Commonly known as: KEPPRA XR Take 4 tablets (2,000 mg total) by mouth at bedtime.   montelukast 10 MG tablet Commonly known as: SINGULAIR TAKE 1 TABLET BY MOUTH EVERYDAY AT BEDTIME What changed:   how much to take  how to take this  when to take this  additional instructions   multivitamin with minerals Tabs tablet Take 1 tablet by mouth daily. Start taking on: April 13, 2020   nystatin 100000 UNIT/ML suspension Commonly known as: MYCOSTATIN Take 5 mLs (500,000 Units total) by mouth 4 (four) times daily.   nystatin powder Commonly known as: MYCOSTATIN/NYSTOP Apply topically 2 (two) times daily.   omeprazole 40 MG capsule Commonly known as: PriLOSEC Take 1 capsule (40 mg total) by mouth daily.   phenol 1.4 % Liqd Commonly known as: CHLORASEPTIC Use as directed 1  spray in the mouth or throat as needed for throat irritation / pain.   polyethylene glycol 17 g packet Commonly known as: MIRALAX / GLYCOLAX Take 17 g by mouth daily as needed for mild constipation.   polyvinyl alcohol 1.4 % ophthalmic solution Commonly known as: LIQUIFILM TEARS Place 1 drop into the right eye as needed for dry eyes.   sodium chloride 0.65 % Soln nasal spray Commonly known as: OCEAN Place 1 spray into both nostrils as needed for congestion.   zinc sulfate 220 (50 Zn) MG capsule Take 1 capsule (220 mg total) by mouth daily for 15 days. Start taking on: April 13, 2020       Follow-up Information    [name], [name] Follow up in 2 week(s).   Specialty: Family Medicine Contact information: [place] [address] [phone]              Allergies  Allergen Reactions  . Advicor [Niacin-Lovastatin Er] Other (See Comments)    Reaction unknown to daughter  . Caduet [Amlodipine-Atorvastatin] Other (See Comments)    Reaction unknown to daughter  . Codeine Other (See Comments)    Reaction unknown to daughter  . Dilantin [Phenytoin] Other (See  Comments)    Reaction unknown to daughter  . Lescol [Fluvastatin] Other (See Comments)    Reaction unknown to daughter  . Pamelor [Nortriptyline] Other (See Comments)    Reaction unknown to daughter  . Penicillins Hives    Did it involve swelling of the face/tongue/throat, SOB, or low BP? Unk Did it involve sudden or severe rash/hives, skin peeling, or any reaction on the inside of your mouth or nose? Unk Did you need to seek medical attention at a hospital or doctor's office? Unk When did it last happen? "it was a long time ago" If all above answers are "NO", may proceed with cephalosporin use.   . Sulfa Antibiotics Hives  . Trileptal [Oxcarbazepine] Other (See Comments)    Reaction unknown to daughter  . Ultram [Tramadol] Other (See Comments)    Reaction unknown to daughter  .  Welchol [Colesevelam] Other (See Comments)    Reaction unknown to daughter  . Latex Rash  . Tomato Rash    Consultations:  None   Procedures/Studies: CT Head Wo Contrast  Result Date: 04/06/2020 CLINICAL DATA:  Left-sided weakness. EXAM: CT HEAD WITHOUT CONTRAST TECHNIQUE: Contiguous axial images were obtained from the base of the skull through the vertex without intravenous contrast. COMPARISON:  CT head March 12, 2019 FINDINGS: Brain: No evidence of acute large vascular territory infarction, hemorrhage, hydrocephalus, extra-axial collection or mass lesion/mass effect. Patchy white matter hypoattenuation appears similar to prior and most likely represents the sequela of chronic microvascular ischemic disease. Similar area of calcification in the inferior left anterior temporal lobe. There is venous gas in the region of the basal venous plexus, right cavernous sinus, right infratemporal fossa, and parapharyngeal space, likely related t gas with IV access. Vascular: Calcific atherosclerosis. No hyperdense vessel identified. Skull: No acute fracture. Sinuses/Orbits: Sinuses are clear.  Unremarkable orbits. Other: No mastoid effusions. IMPRESSION: 1. No evidence of acute intracranial abnormality. 2. Chronic microvascular ischemic disease. Electronically Signed   By: Margaretha Sheffield MD   On: 04/06/2020 16:40   MR ANGIO HEAD WO CONTRAST  Result Date: 04/07/2020 CLINICAL DATA:  Neuro deficit, acute, stroke suspected EXAM: MRI HEAD WITHOUT CONTRAST MRA HEAD WITHOUT CONTRAST TECHNIQUE: Multiplanar, multiecho pulse sequences of the brain and surrounding structures were obtained without intravenous contrast. Angiographic images of the head were obtained using MRA technique without contrast. COMPARISON:  04/06/2020 and prior. FINDINGS: MRI HEAD FINDINGS Brain: No diffusion-weighted signal abnormality. No acute intracranial hemorrhage. No midline shift, ventriculomegaly or extra-axial fluid collection.  1.1 x 0.7 x 0.6 cm T2 hyperintense left temporal lesion with a T2 hypointense rim and associated SWI signal dropout likely reflects a cavernoma (15:7, 19:18). Mild cerebral atrophy with ex vacuo dilatation. Moderate chronic microvascular ischemic changes. Vascular: Please see MRA. Skull and upper cervical spine: Normal marrow signal. Sinuses/Orbits: Sequela of bilateral lens replacement. Pneumatized paranasal sinuses and mastoid air cells. Other: Please note that image quality is degraded by motion artifact. MRA HEAD FINDINGS Anterior circulation: Irregularity of the distal cervical and petrous ICA segments with mild narrowing secondary to atheromatous disease. Patent cavernous segments with mild distal narrowing on the right. Mild bilateral supraclinoid ICA narrowing. 1-2 mm left supraclinoid ICA outpouching (10:137), infundibulum versus small aneurysm. Patent ACAs. ACA irregularity may be secondary to atheromatous disease versus motion artifact. Mild to moderate bilateral A2 segment narrowing. Patent left MCA. Multifocal mild-to-moderate narrowing involving the right M1 and M2 segments. Multifocal mild left M2 segment narrowing. Posterior circulation: Dominant left vertebral artery. Right vertebral artery terminates  as PICA. Patent basilar artery. Mild proximal left superior cerebellar artery narrowing. Patent bilateral PCAs. Mild left P2 segment narrowing. Venous sinuses: No evidence of thrombosis. Anatomic variants: None of significance. IMPRESSION: MRI head: No acute intracranial process. Moderate chronic microvascular ischemic changes. 1.1 cm left temporal cavernoma. MRA head: Intracranial atheromatous disease with mild narrowing of the bilateral ICAs and proximal left superior cerebellar artery. 1-2 mm left supraclinoid ICA aneurysm versus infundibulum. Multifocal mild-to-moderate right M1/M2, left M2, bilateral A2 and left P2 segment narrowing. Electronically Signed   By: Primitivo Gauze M.D.   On:  04/07/2020 09:46   MR BRAIN WO CONTRAST  Result Date: 04/07/2020 CLINICAL DATA:  Neuro deficit, acute, stroke suspected EXAM: MRI HEAD WITHOUT CONTRAST MRA HEAD WITHOUT CONTRAST TECHNIQUE: Multiplanar, multiecho pulse sequences of the brain and surrounding structures were obtained without intravenous contrast. Angiographic images of the head were obtained using MRA technique without contrast. COMPARISON:  04/06/2020 and prior. FINDINGS: MRI HEAD FINDINGS Brain: No diffusion-weighted signal abnormality. No acute intracranial hemorrhage. No midline shift, ventriculomegaly or extra-axial fluid collection. 1.1 x 0.7 x 0.6 cm T2 hyperintense left temporal lesion with a T2 hypointense rim and associated SWI signal dropout likely reflects a cavernoma (15:7, 19:18). Mild cerebral atrophy with ex vacuo dilatation. Moderate chronic microvascular ischemic changes. Vascular: Please see MRA. Skull and upper cervical spine: Normal marrow signal. Sinuses/Orbits: Sequela of bilateral lens replacement. Pneumatized paranasal sinuses and mastoid air cells. Other: Please note that image quality is degraded by motion artifact. MRA HEAD FINDINGS Anterior circulation: Irregularity of the distal cervical and petrous ICA segments with mild narrowing secondary to atheromatous disease. Patent cavernous segments with mild distal narrowing on the right. Mild bilateral supraclinoid ICA narrowing. 1-2 mm left supraclinoid ICA outpouching (10:137), infundibulum versus small aneurysm. Patent ACAs. ACA irregularity may be secondary to atheromatous disease versus motion artifact. Mild to moderate bilateral A2 segment narrowing. Patent left MCA. Multifocal mild-to-moderate narrowing involving the right M1 and M2 segments. Multifocal mild left M2 segment narrowing. Posterior circulation: Dominant left vertebral artery. Right vertebral artery terminates as PICA. Patent basilar artery. Mild proximal left superior cerebellar artery narrowing. Patent  bilateral PCAs. Mild left P2 segment narrowing. Venous sinuses: No evidence of thrombosis. Anatomic variants: None of significance. IMPRESSION: MRI head: No acute intracranial process. Moderate chronic microvascular ischemic changes. 1.1 cm left temporal cavernoma. MRA head: Intracranial atheromatous disease with mild narrowing of the bilateral ICAs and proximal left superior cerebellar artery. 1-2 mm left supraclinoid ICA aneurysm versus infundibulum. Multifocal mild-to-moderate right M1/M2, left M2, bilateral A2 and left P2 segment narrowing. Electronically Signed   By: Primitivo Gauze M.D.   On: 04/07/2020 09:46   US Carotid Bilateral (at [facility] and [facility] only)  Result Date: 04/07/2020 CLINICAL DATA:  Left-sided weakness.  TIA.  History of hypertension EXAM: BILATERAL CAROTID DUPLEX ULTRASOUND TECHNIQUE: Pearline Cables scale imaging, color Doppler and duplex ultrasound were performed of bilateral carotid and vertebral arteries in the neck. COMPARISON:  None. FINDINGS: Criteria: Quantification of carotid stenosis is based on velocity parameters that correlate the residual internal carotid diameter with NASCET-based stenosis levels, using the diameter of the distal internal carotid lumen as the denominator for stenosis measurement. The following velocity measurements were obtained: RIGHT ICA: 44/16 cm/sec CCA: 85/27 cm/sec SYSTOLIC ICA/CCA RATIO:  1.0 ECA: 27 cm/sec LEFT ICA: 49/17 cm/sec CCA: 78/24 cm/sec SYSTOLIC ICA/CCA RATIO:  1.2 ECA: 37 cm/sec RIGHT CAROTID ARTERY: There is a minimal amount of intimal thickening/atherosclerotic plaque involving the right carotid bulb, extending  to involve the origin and proximal aspects of the right internal carotid artery (image 26), not resulting in elevated peak systolic velocities within the interrogated course of the right internal carotid artery to suggest a hemodynamically significant stenosis. RIGHT VERTEBRAL ARTERY:  Antegrade flow LEFT CAROTID ARTERY: There is a moderate  amount of eccentric echogenic plaque within the left carotid bulb (images 50 and 52), extending to involve the origin and proximal aspects of the left internal carotid artery (image 60), not resulting in elevated peak systolic velocities within the interrogated course of the left internal carotid artery to suggest a hemodynamically significant stenosis. LEFT VERTEBRAL ARTERY:  Antegrade flow IMPRESSION: Minimal to moderate amount of bilateral atherosclerotic plaque, left greater than right, not resulting in a hemodynamically significant stenosis within either internal carotid artery. Electronically Signed   By: Sandi Mariscal M.D.   On: 04/07/2020 10:31   US Venous Img Lower Unilateral Left  Result Date: 04/06/2020 CLINICAL DATA:  Weakness EXAM: LEFT LOWER EXTREMITY VENOUS DOPPLER ULTRASOUND TECHNIQUE: Gray-scale sonography with compression, as well as color and duplex ultrasound, were performed to evaluate the deep venous system(s) from the level of the common femoral vein through the popliteal and proximal calf veins. COMPARISON:  None. FINDINGS: VENOUS Normal compressibility of the common femoral, superficial femoral, and popliteal veins, as well as the visualized calf veins. Visualized portions of profunda femoral vein and great saphenous vein unremarkable. No filling defects to suggest DVT on grayscale or color Doppler imaging. Doppler waveforms show normal direction of venous flow, normal respiratory plasticity and response to augmentation. Limited views of the contralateral common femoral vein are unremarkable. OTHER None. Limitations: none IMPRESSION: Negative. Electronically Signed   By: Constance Holster M.D.   On: 04/06/2020 15:58   DG Chest Port 1 View  Result Date: 04/09/2020 CLINICAL DATA:  COVID, hypoxemia EXAM: PORTABLE CHEST 1 VIEW COMPARISON:  04/06/2020 FINDINGS: Cardiomegaly. Unchanged small to moderate left pleural effusion with associated atelectasis or consolidation. No acute appearing  airspace opacity. The visualized skeletal structures are unremarkable. IMPRESSION: 1. Unchanged small to moderate left pleural effusion with associated atelectasis or consolidation. No acute appearing airspace opacity. 2. Cardiomegaly. Electronically Signed   By: Eddie Candle M.D.   On: 04/09/2020 12:07   Portable chest 1 View  Result Date: 04/06/2020 CLINICAL DATA:  COVID-19 positive, weakness for 1 week EXAM: PORTABLE CHEST 1 VIEW COMPARISON:  04/06/2020 at 2:51 p.m. FINDINGS: Two frontal views of the chest are obtained. The cardiac silhouette is unremarkable. Scattered interstitial and alveolar opacities are identified, most pronounced at the left lung base. I cannot exclude a small left pleural effusion. No pneumothorax. No acute bony abnormalities. IMPRESSION: 1. Scattered interstitial and alveolar opacities, greatest at the left lung base, consistent with multifocal pneumonia. 2. Small left pleural effusion. Electronically Signed   By: Randa Ngo M.D.   On: 04/06/2020 20:25   DG Chest Port 1 View  Result Date: 04/06/2020 CLINICAL DATA:  Weakness.  No chest complaints. EXAM: PORTABLE CHEST 1 VIEW COMPARISON:  Chest x-ray dated March 08, 2020. FINDINGS: The patient is rotated to the left, limiting evaluation. This likely accounts for silhouetting of the left hemidiaphragm, with retrocardiac left lower lobe airspace disease less likely. The right lung is clear. No pneumothorax or large pleural effusion. No acute osseous abnormality. IMPRESSION: 1. Limited study due to rotation. No definite active disease, although repeat single-view chest x-ray without rotation or PA and lateral chest x-ray should be considered. Electronically Signed   By: Gwyndolyn Saxon  Marzella Schlein M.D.   On: 04/06/2020 15:03   ECHOCARDIOGRAM COMPLETE  Result Date: 04/08/2020    ECHOCARDIOGRAM REPORT   Patient Name:   [name] Date of Exam: 04/08/2020 Medical Rec #:  [id number]      Height:       64.0 in Accession #:     [id number]     Weight:       202.8 lb Date of Birth:  [birth year]      BSA:          1.968 m Patient Age:    86 years       BP:           92/69 mmHg Patient Gender: F              HR:           100 bpm. Exam Location:  [name] Procedure: 2D Echo, Cardiac Doppler and Color Doppler Indications:    TIA 435.9 / G45.9  History:        Patient has prior history of Echocardiogram examinations, most                 recent 01/10/2020. Stroke, Arrythmias:Atrial Fibrillation; Risk                 Factors:Hypertension. Obesity.  Sonographer:    Alvino Chapel RCS Referring Phys: [id number] [place]  Sonographer Comments: Patient Extremely Confused IMPRESSIONS  1. Left ventricular ejection fraction, by estimation, is 65 to 70%. The left ventricle has normal function. The left ventricle has no regional wall motion abnormalities. There is moderate left ventricular hypertrophy. Left ventricular diastolic parameters are indeterminate.  2. The ventricular septum is flattened in sysotle and diastole consistent with RV pressure and volume overload. . Right ventricular systolic function is moderately reduced. The right ventricular size is severely enlarged.  3. Left atrial size was severely dilated.  4. Right atrial size was severely dilated.  5. The pericardial effusion is circumferential.  6. The mitral valve is normal in structure. Mild mitral valve regurgitation. No evidence of mitral stenosis.  7. Tricuspid valve regurgitation is mild to moderate.  8. The aortic valve is tricuspid. There is mild calcification of the aortic valve. There is mild thickening of the aortic valve. Aortic valve regurgitation is not visualized. No aortic stenosis is present.  9. At least moderate pulmonary HTN, PASP is at least 53 mmHg. Not able to cooperate with IVC evaluation. FINDINGS  Left Ventricle: Left ventricular ejection fraction, by estimation, is 65 to 70%. The left ventricle has normal function. The left ventricle has no regional wall  motion abnormalities. The left ventricular internal cavity size was normal in size. There is  moderate left ventricular hypertrophy. Left ventricular diastolic parameters are indeterminate. Right Ventricle: The ventricular septum is flattened in sysotle and diastole consistent with RV pressure and volume overload. The right ventricular size is severely enlarged. Right vetricular wall thickness was not assessed. Right ventricular systolic function is moderately reduced. Left Atrium: Left atrial size was severely dilated. Right Atrium: Right atrial size was severely dilated. Pericardium: Trivial pericardial effusion is present. The pericardial effusion is circumferential. Mitral Valve: The mitral valve is normal in structure. Mild mitral valve regurgitation. No evidence of mitral valve stenosis. Tricuspid Valve: The tricuspid valve is normal in structure. Tricuspid valve regurgitation is mild to moderate. No evidence of tricuspid stenosis. Aortic Valve: The aortic valve is tricuspid. There is mild calcification of the aortic valve. There  is mild thickening of the aortic valve. There is mild aortic valve annular calcification. Aortic valve regurgitation is not visualized. No aortic stenosis  is present. Aortic valve mean gradient measures 2.2 mmHg. Aortic valve peak gradient measures 3.9 mmHg. Aortic valve area, by VTI measures 2.50 cm. Pulmonic Valve: The pulmonic valve was not well visualized. Pulmonic valve regurgitation is mild. No evidence of pulmonic stenosis. Aorta: The aortic root is normal in size and structure. Pulmonary Artery: At least moderate pulmonary HTN, PASP is at least 53 mmHg. Not able to cooperate with IVC evaluation. IAS/Shunts: No atrial level shunt detected by color flow Doppler.  LEFT VENTRICLE PLAX 2D LVIDd:         3.90 cm  Diastology LVIDs:         2.40 cm  LV e' medial:    7.51 cm/s LV PW:         1.30 cm  LV E/e' medial:  10.7 LV IVS:        1.30 cm  LV e' lateral:   8.59 cm/s LVOT diam:      2.00 cm  LV E/e' lateral: 9.3 LV SV:         35 LV SV Index:   18 LVOT Area:     3.14 cm  RIGHT VENTRICLE RV S prime:     6.64 cm/s TAPSE (M-mode): 1.3 cm LEFT ATRIUM             Index       RIGHT ATRIUM           Index LA diam:        4.10 cm 2.08 cm/m  RA Area:     25.40 cm LA Vol (A2C):   86.5 ml 43.95 ml/m RA Volume:   82.10 ml  41.72 ml/m LA Vol (A4C):   63.0 ml 31.99 ml/m LA Biplane Vol: 78.9 ml 40.09 ml/m  AORTIC VALVE AV Area (Vmax):    2.20 cm AV Area (Vmean):   1.95 cm AV Area (VTI):     2.50 cm AV Vmax:           99.03 cm/s AV Vmean:          71.406 cm/s AV VTI:            0.141 m AV Peak Grad:      3.9 mmHg AV Mean Grad:      2.2 mmHg LVOT Vmax:         69.50 cm/s LVOT Vmean:        44.300 cm/s LVOT VTI:          0.112 m LVOT/AV VTI ratio: 0.80  AORTA Ao Root diam: 3.00 cm MITRAL VALVE               TRICUSPID VALVE MV Area (PHT): 3.72 cm    TR Peak grad:   44.6 mmHg MV Decel Time: 204 msec    TR Vmax:        334.00 cm/s MV E velocity: 80.05 cm/s MV A velocity: 25.20 cm/s  SHUNTS MV E/A ratio:  3.18        Systemic VTI:  0.11 m                            Systemic Diam: 2.00 cm Carlyle Dolly MD Electronically signed by Carlyle Dolly MD Signature Date/Time: 04/08/2020/1:46:04 PM    Final       Discharge Exam:  Vitals:   04/12/20 0851 04/12/20 1352  BP:  (!) 144/74  Pulse:  89  Resp:  17  Temp:  97.6 F (36.4 C)  SpO2: 95% 100%   Vitals:   04/11/20 2059 04/12/20 0512 04/12/20 0851 04/12/20 1352  BP: 138/62 (!) 119/59  (!) 144/74  Pulse: 95 89  89  Resp: 18 18  17   Temp: 97.7 F (36.5 C) 98.2 F (36.8 C)  97.6 F (36.4 C)  TempSrc:      SpO2: 96% 95% 95% 100%  Weight:  99.3 kg    Height:        General: Pt is alert, awake, not in acute distress, obese Cardiovascular: RRR, S1/S2 +, no rubs, no gallops Respiratory: CTA bilaterally, no wheezing, no rhonchi Abdominal: Soft, NT, ND, bowel sounds + Extremities: no edema, no cyanosis    The results of  significant diagnostics from this hospitalization (including imaging, microbiology, ancillary and laboratory) are listed below for reference.     Microbiology: Recent Results (from the past 240 hour(s))  Urine Culture     Status: Abnormal (Preliminary result)   Collection Time: 04/04/20  4:49 PM   UR  Result Value Ref Range Status   Urine Culture, Routine Preliminary report (A)  Preliminary   Organism ID, Bacteria Gram negative rods (A)  Preliminary    Comment: Greater than 100,000 colony forming units per mL  Microscopic Examination     Status: None   Collection Time: 04/04/20  4:59 PM   Urine  Result Value Ref Range Status   WBC, UA None seen 0 - 5 /hpf Final   RBC None seen 0 - 2 /hpf Final   Epithelial Cells (non renal) CANCELED      Comment: Test not performed  Result canceled by the ancillary.    Bacteria, UA Few None seen/Few Final  Resp Panel by RT-PCR (Flu A&B, Covid) Nasopharyngeal Swab     Status: Abnormal   Collection Time: 04/06/20  2:44 PM   Specimen: Nasopharyngeal Swab; Nasopharyngeal(NP) swabs in vial transport medium  Result Value Ref Range Status   SARS Coronavirus 2 by RT PCR POSITIVE (A) NEGATIVE Final    Comment: RESULT CALLED TO, READ BACK BY AND VERIFIED WITH: HASTINGS,K [id number] ON 04/06/20 BY HUFFINES,S. (NOTE) SARS-CoV-2 target nucleic acids are DETECTED.  The SARS-CoV-2 RNA is generally detectable in upper respiratory specimens during the acute phase of infection. Positive results are indicative of the presence of the identified virus, but do not rule out bacterial infection or co-infection with other pathogens not detected by the test. Clinical correlation with patient history and other diagnostic information is necessary to determine patient infection status. The expected result is Negative.  Fact Sheet for Patients: EntrepreneurPulse.com.au  Fact Sheet for Healthcare  Providers: IncredibleEmployment.be  This test is not yet approved or cleared by the [place] FDA and  has been authorized for detection and/or diagnosis of SARS-CoV-2 by FDA under an Emergency Use Authorization (EUA).  This EUA will remain in effect (meaning this  test can be used) for the duration of  the COVID-19 declaration under Section 564(b)(1) of the Act, 21 U.S.C. section 360bbb-3(b)(1), unless the authorization is terminated or revoked sooner.     Influenza A by PCR NEGATIVE NEGATIVE Final   Influenza B by PCR NEGATIVE NEGATIVE Final    Comment: (NOTE) The Xpert Xpress SARS-CoV-2/FLU/RSV plus assay is intended as an aid in the diagnosis of influenza from Nasopharyngeal swab specimens and should not be used  as a sole basis for treatment. Nasal washings and aspirates are unacceptable for Xpert Xpress SARS-CoV-2/FLU/RSV testing.  Fact Sheet for Patients: EntrepreneurPulse.com.au  Fact Sheet for Healthcare Providers: IncredibleEmployment.be  This test is not yet approved or cleared by the [place] FDA and has been authorized for detection and/or diagnosis of SARS-CoV-2 by FDA under an Emergency Use Authorization (EUA). This EUA will remain in effect (meaning this test can be used) for the duration of the COVID-19 declaration under Section 564(b)(1) of the Act, 21 U.S.C. section 360bbb-3(b)(1), unless the authorization is terminated or revoked.  Performed at [facility], [address], [place], [place] [address]   Blood Culture (routine x 2)     Status: None   Collection Time: 04/06/20  5:47 PM   Specimen: BLOOD RIGHT FOREARM  Result Value Ref Range Status   Specimen Description BLOOD RIGHT FOREARM  Final   Special Requests   Final    BOTTLES DRAWN AEROBIC ONLY Blood Culture results may not be optimal due to an inadequate volume of blood received in culture bottles   Culture   Final    NO GROWTH 5  DAYS Performed at [facility], [address] [place], [place] [address]    Report Status 04/11/2020 FINAL  Final  Blood Culture (routine x 2)     Status: None   Collection Time: 04/06/20  7:20 PM   Specimen: BLOOD  Result Value Ref Range Status   Specimen Description BLOOD  Final   Special Requests NONE  Final   Culture   Final    NO GROWTH 5 DAYS Performed at [facility], [address], [place], [place] [address]    Report Status 04/11/2020 FINAL  Final  Urine culture     Status: None   Collection Time: 04/08/20  6:40 PM   Specimen: In/Out Cath Urine  Result Value Ref Range Status   Specimen Description   Final    IN/OUT CATH URINE Performed at [facility], [address], [place], [place] [address]    Special Requests   Final    NONE Performed at [facility], [address], [place], [place] [address]    Culture   Final    NO GROWTH Performed at [facility], [place] [address], [place], [place] [address]    Report Status 04/09/2020 FINAL  Final     Labs: BNP (last 3 results) Recent Labs    04/06/20 1920 04/09/20 0706  BNP 1,025.0* 4,268.3*   Basic Metabolic Panel: Recent Labs  Lab 04/07/20 0503 04/08/20 0528 04/09/20 0706 04/10/20 0950 04/11/20 0656  NA 135 133* 133* 136 134*  K 4.7 5.0 4.7 4.8 4.8  CL 103 103 105 104 102  CO2 18* 18* 19* 20* 24  GLUCOSE 110* 153* 130* 105* 108*  BUN 21 32* 40* 42* 37*  CREATININE 1.39* 1.86* 1.61* 1.38* 1.23*  CALCIUM 9.4 9.3 9.1 9.7 9.6  MG 2.5* 2.4 2.3 2.4 2.1  PHOS 4.1 4.2 3.4 2.2* 2.3*   Liver Function Tests: Recent Labs  Lab 04/07/20 0503 04/08/20 0528 04/09/20 0706 04/10/20 0950 04/11/20 0656  AST 20 24 19 27 23   ALT 11 11 11 11 11   ALKPHOS 52 44 43 46 45  BILITOT 0.5 0.6 0.5 0.5 0.3  PROT 7.0 6.3* 6.2* 7.0 6.2*  ALBUMIN 2.9* 2.6* 2.6* 3.0* 2.7*   No results for input(s): LIPASE, AMYLASE in the last 168 hours. No results for input(s): AMMONIA in the last 168 hours.  CBC: Recent Labs  Lab  04/07/20 0503 04/08/20 0528 04/09/20 0706 04/10/20 0950 04/11/20 0656  WBC 12.3* 11.8* 13.3* 15.2* 15.9*  NEUTROABS 10.3* 10.2* 11.1* 12.4* 12.7*  HGB 9.9* 9.5* 8.9* 10.2* 10.2*  HCT 32.9* 31.0* 28.8* 33.1* 32.0*  MCV 80.8 79.1* 78.9* 78.8* 77.7*  PLT 404* 392 390 502* 418*   Cardiac Enzymes: No results for input(s): CKTOTAL, CKMB, CKMBINDEX, TROPONINI in the last 168 hours. BNP: Invalid input(s): POCBNP CBG: No results for input(s): GLUCAP in the last 168 hours. D-Dimer Recent Labs    04/10/20 0950 04/11/20 0656  DDIMER 3.13* 2.71*   Hgb A1c No results for input(s): HGBA1C in the last 72 hours. Lipid Profile No results for input(s): CHOL, HDL, LDLCALC, TRIG, CHOLHDL, LDLDIRECT in the last 72 hours. Thyroid function studies No results for input(s): TSH, T4TOTAL, T3FREE, THYROIDAB in the last 72 hours.  Invalid input(s): FREET3 Anemia work up Recent Labs    04/10/20 0950 04/11/20 0656  FERRITIN 35 33   Urinalysis    Component Value Date/Time   COLORURINE YELLOW 04/08/2020 [place] 04/08/2020 1840   APPEARANCEUR Clear 04/04/2020 1659   LABSPEC 1.021 04/08/2020 1840   PHURINE 5.0 04/08/2020 1840   GLUCOSEU NEGATIVE 04/08/2020 1840   HGBUR NEGATIVE 04/08/2020 1840   BILIRUBINUR NEGATIVE 04/08/2020 1840   BILIRUBINUR Negative 04/04/2020 1659   KETONESUR 5 (A) 04/08/2020 1840   PROTEINUR 30 (A) 04/08/2020 1840   UROBILINOGEN negative 03/09/2015 1652   NITRITE NEGATIVE 04/08/2020 1840   LEUKOCYTESUR NEGATIVE 04/08/2020 1840   Sepsis Labs Invalid input(s): PROCALCITONIN,  WBC,  LACTICIDVEN Microbiology Recent Results (from the past 240 hour(s))  Urine Culture     Status: Abnormal (Preliminary result)   Collection Time: 04/04/20  4:49 PM   UR  Result Value Ref Range Status   Urine Culture, Routine Preliminary report (A)  Preliminary   Organism ID, Bacteria Gram negative rods (A)  Preliminary    Comment: Greater than 100,000 colony forming  units per mL  Microscopic Examination     Status: None   Collection Time: 04/04/20  4:59 PM   Urine  Result Value Ref Range Status   WBC, UA None seen 0 - 5 /hpf Final   RBC None seen 0 - 2 /hpf Final   Epithelial Cells (non renal) CANCELED      Comment: Test not performed  Result canceled by the ancillary.    Bacteria, UA Few None seen/Few Final  Resp Panel by RT-PCR (Flu A&B, Covid) Nasopharyngeal Swab     Status: Abnormal   Collection Time: 04/06/20  2:44 PM   Specimen: Nasopharyngeal Swab; Nasopharyngeal(NP) swabs in vial transport medium  Result Value Ref Range Status   SARS Coronavirus 2 by RT PCR POSITIVE (A) NEGATIVE Final    Comment: RESULT CALLED TO, READ BACK BY AND VERIFIED WITH: HASTINGS,K [id number] ON 04/06/20 BY HUFFINES,S. (NOTE) SARS-CoV-2 target nucleic acids are DETECTED.  The SARS-CoV-2 RNA is generally detectable in upper respiratory specimens during the acute phase of infection. Positive results are indicative of the presence of the identified virus, but do not rule out bacterial infection or co-infection with other pathogens not detected by the test. Clinical correlation with patient history and other diagnostic information is necessary to determine patient infection status. The expected result is Negative.  Fact Sheet for Patients: EntrepreneurPulse.com.au  Fact Sheet for Healthcare Providers: IncredibleEmployment.be  This test is not yet approved or cleared by the [place] FDA and  has  been authorized for detection and/or diagnosis of SARS-CoV-2 by FDA under an Emergency Use Authorization (EUA).  This EUA will remain in effect (meaning this  test can be used) for the duration of  the COVID-19 declaration under Section 564(b)(1) of the Act, 21 U.S.C. section 360bbb-3(b)(1), unless the authorization is terminated or revoked sooner.     Influenza A by PCR NEGATIVE NEGATIVE Final   Influenza B by PCR NEGATIVE  NEGATIVE Final    Comment: (NOTE) The Xpert Xpress SARS-CoV-2/FLU/RSV plus assay is intended as an aid in the diagnosis of influenza from Nasopharyngeal swab specimens and should not be used as a sole basis for treatment. Nasal washings and aspirates are unacceptable for Xpert Xpress SARS-CoV-2/FLU/RSV testing.  Fact Sheet for Patients: EntrepreneurPulse.com.au  Fact Sheet for Healthcare Providers: IncredibleEmployment.be  This test is not yet approved or cleared by the [place] FDA and has been authorized for detection and/or diagnosis of SARS-CoV-2 by FDA under an Emergency Use Authorization (EUA). This EUA will remain in effect (meaning this test can be used) for the duration of the COVID-19 declaration under Section 564(b)(1) of the Act, 21 U.S.C. section 360bbb-3(b)(1), unless the authorization is terminated or revoked.  Performed at [facility], [address], [place], [place] [address]   Blood Culture (routine x 2)     Status: None   Collection Time: 04/06/20  5:47 PM   Specimen: BLOOD RIGHT FOREARM  Result Value Ref Range Status   Specimen Description BLOOD RIGHT FOREARM  Final   Special Requests   Final    BOTTLES DRAWN AEROBIC ONLY Blood Culture results may not be optimal due to an inadequate volume of blood received in culture bottles   Culture   Final    NO GROWTH 5 DAYS Performed at [facility] [address], [place], [place] [address]    Report Status 04/11/2020 FINAL  Final  Blood Culture (routine x 2)     Status: None   Collection Time: 04/06/20  7:20 PM   Specimen: BLOOD  Result Value Ref Range Status   Specimen Description BLOOD  Final   Special Requests NONE  Final   Culture   Final    NO GROWTH 5 DAYS Performed at [facility], [address] [place], [place] [address]    Report Status 04/11/2020 FINAL  Final  Urine culture     Status: None   Collection Time: 04/08/20  6:40 PM   Specimen: In/Out Cath Urine   Result Value Ref Range Status   Specimen Description   Final    IN/OUT CATH URINE Performed at [facility], [address] [place], [place] [address]    Special Requests   Final    NONE Performed at [facility], [address] [place], [place] [address]    Culture   Final    NO GROWTH Performed at [facility], [place] [address] [place], [place] [address]    Report Status 04/09/2020 FINAL  Final     Time coordinating discharge: 35 minutes  SIGNED:   Rodena Goldmann, DO [facility] 04/12/2020, 2:15 PM  If 7PM-7AM, please contact night-coverage [email or web address]

## 2020-04-12 NOTE — TOC Progression Note (Signed)
Transition of Care [facility]) - Progression Note    Patient Details  Name: [name] MRN: [id number] Date of Birth: 11-16-1923  Transition [facility]) CM/SW Contact  Ihor Gully, LCSW Phone Number: 04/12/2020, 4:59 PM  Clinical Narrative:    Patient will discharge to the [facility] once authorized.   Expected Discharge Plan: [place] Barriers to Discharge: Continued Medical Work up  Expected Discharge Plan and Services Expected Discharge Plan: [place] arrangements for the past 2 months: Single Family Home                                       Social Determinants of Health (SDOH) Interventions    Readmission Risk Interventions Readmission Risk Prevention Plan 02/08/2020 01/07/2020  Transportation Screening Complete Complete  PCP or Specialist Appt within 5-7 Days - Complete  Home Care Screening - Complete  Medication Review (RN CM) - Complete  HRI or Home Care Consult Complete -  Social Work Consult for Recovery Care Planning/Counseling Complete -  Palliative Care Screening Complete -  Medication Review Press photographer) Complete -  Some recent data might be hidden

## 2020-04-13 DIAGNOSIS — N179 Acute kidney failure, unspecified: Secondary | ICD-10-CM

## 2020-04-13 DIAGNOSIS — N1832 Chronic kidney disease, stage 3b: Secondary | ICD-10-CM

## 2020-04-13 LAB — CBC
HCT: 29.1 % — ABNORMAL LOW (ref 36.0–46.0)
Hemoglobin: 9.2 g/dL — ABNORMAL LOW (ref 12.0–15.0)
MCH: 24.6 pg — ABNORMAL LOW (ref 26.0–34.0)
MCHC: 31.6 g/dL (ref 30.0–36.0)
MCV: 77.8 fL — ABNORMAL LOW (ref 80.0–100.0)
Platelets: 400 10*3/uL (ref 150–400)
RBC: 3.74 MIL/uL — ABNORMAL LOW (ref 3.87–5.11)
RDW: 23.5 % — ABNORMAL HIGH (ref 11.5–15.5)
WBC: 15.2 10*3/uL — ABNORMAL HIGH (ref 4.0–10.5)
nRBC: 3.9 % — ABNORMAL HIGH (ref 0.0–0.2)

## 2020-04-13 LAB — BASIC METABOLIC PANEL
Anion gap: 6 (ref 5–15)
BUN: 37 mg/dL — ABNORMAL HIGH (ref 8–23)
CO2: 26 mmol/L (ref 22–32)
Calcium: 9.4 mg/dL (ref 8.9–10.3)
Chloride: 101 mmol/L (ref 98–111)
Creatinine, Ser: 1.21 mg/dL — ABNORMAL HIGH (ref 0.44–1.00)
GFR, Estimated: 42 mL/min — ABNORMAL LOW (ref >60)
Glucose, Bld: 81 mg/dL (ref 70–99)
Potassium: 4.7 mmol/L (ref 3.5–5.1)
Sodium: 133 mmol/L — ABNORMAL LOW (ref 135–145)

## 2020-04-13 NOTE — TOC Progression Note (Addendum)
Transition of Care [facility]) - Progression Note    Patient Details  Name: [name] MRN: [id number] Date of Birth: Jan 06, 1924  Transition of Care [facility]) CM/SW Contact  Ihor Gully, LCSW Phone Number: 04/13/2020, 5:17 PM  Clinical Narrative:    Facilit receives [place]. EMS contacted to arrange transport for 04/14/20 to transport to [facility] [place], [place], [place]. EMS scheduled transport, however they are unsure of the feasibility of transporting on 12/30 due to the transport being over 2 hours and conflicting with previously scheduled appointments. EMS to contact TOC in the morning on of 12/30 to confirm that they can provide transport.  [facility] contacted that advised that the transport would be $2228 upfront.  Son, Eddie Dibbles, advised of information. Indicated that it was not feasible for them to pay upfront as her insurance should pay and stated that EMS would need to be her modality of transportation.  Discharge clinicals securely emailed to A. Sharlene Motts with [facility].   Expected Discharge Plan: [place] Barriers to Discharge: Continued Medical Work up  Expected Discharge Plan and Services Expected Discharge Plan: [place] arrangements for the past 2 months: Single Family Home                                       Social Determinants of Health (SDOH) Interventions    Readmission Risk Interventions Readmission Risk Prevention Plan 02/08/2020 01/07/2020  Transportation Screening Complete Complete  PCP or Specialist Appt within 5-7 Days - Complete  Home Care Screening - Complete  Medication Review (RN CM) - Complete  HRI or Home Care Consult Complete -  Social Work Consult for Recovery Care Planning/Counseling Complete -  Palliative Care Screening Complete -  Medication Review Press photographer) Complete -  Some recent data might be hidden

## 2020-04-13 NOTE — Progress Notes (Signed)
Patient seen and examined. Remains hemodynamically stable for discharge and not overnight events reported. Discharge summary written on 04/12/20 reviewed and appropriate. Waiting on insurance authorization prior for patient to be discharge to SNF.  Barton Dubois MD [phone]

## 2020-04-14 DIAGNOSIS — R5381 Other malaise: Secondary | ICD-10-CM | POA: Diagnosis not present

## 2020-04-14 DIAGNOSIS — G40909 Epilepsy, unspecified, not intractable, without status epilepticus: Secondary | ICD-10-CM | POA: Diagnosis not present

## 2020-04-14 DIAGNOSIS — J1282 Pneumonia due to coronavirus disease 2019: Secondary | ICD-10-CM | POA: Diagnosis not present

## 2020-04-14 DIAGNOSIS — I10 Essential (primary) hypertension: Secondary | ICD-10-CM | POA: Diagnosis not present

## 2020-04-14 DIAGNOSIS — M1040 Other secondary gout, unspecified site: Secondary | ICD-10-CM | POA: Diagnosis not present

## 2020-04-14 DIAGNOSIS — J45909 Unspecified asthma, uncomplicated: Secondary | ICD-10-CM | POA: Diagnosis not present

## 2020-04-14 DIAGNOSIS — I639 Cerebral infarction, unspecified: Secondary | ICD-10-CM | POA: Diagnosis not present

## 2020-04-14 DIAGNOSIS — Z7401 Bed confinement status: Secondary | ICD-10-CM | POA: Diagnosis not present

## 2020-04-14 DIAGNOSIS — E639 Nutritional deficiency, unspecified: Secondary | ICD-10-CM | POA: Diagnosis not present

## 2020-04-14 DIAGNOSIS — B370 Candidal stomatitis: Secondary | ICD-10-CM | POA: Diagnosis not present

## 2020-04-14 DIAGNOSIS — G40509 Epileptic seizures related to external causes, not intractable, without status epilepticus: Secondary | ICD-10-CM | POA: Diagnosis not present

## 2020-04-14 DIAGNOSIS — J99 Respiratory disorders in diseases classified elsewhere: Secondary | ICD-10-CM | POA: Diagnosis not present

## 2020-04-14 DIAGNOSIS — K219 Gastro-esophageal reflux disease without esophagitis: Secondary | ICD-10-CM | POA: Diagnosis not present

## 2020-04-14 DIAGNOSIS — K5909 Other constipation: Secondary | ICD-10-CM | POA: Diagnosis not present

## 2020-04-14 DIAGNOSIS — M6281 Muscle weakness (generalized): Secondary | ICD-10-CM | POA: Diagnosis not present

## 2020-04-14 DIAGNOSIS — N183 Chronic kidney disease, stage 3 unspecified: Secondary | ICD-10-CM | POA: Diagnosis not present

## 2020-04-14 DIAGNOSIS — Z8616 Personal history of COVID-19: Secondary | ICD-10-CM | POA: Diagnosis not present

## 2020-04-14 DIAGNOSIS — M545 Low back pain, unspecified: Secondary | ICD-10-CM | POA: Diagnosis not present

## 2020-04-14 DIAGNOSIS — J4540 Moderate persistent asthma, uncomplicated: Secondary | ICD-10-CM | POA: Diagnosis not present

## 2020-04-14 DIAGNOSIS — Z743 Need for continuous supervision: Secondary | ICD-10-CM | POA: Diagnosis not present

## 2020-04-14 DIAGNOSIS — M6250 Muscle wasting and atrophy, not elsewhere classified, unspecified site: Secondary | ICD-10-CM | POA: Diagnosis not present

## 2020-04-14 DIAGNOSIS — M48062 Spinal stenosis, lumbar region with neurogenic claudication: Secondary | ICD-10-CM | POA: Diagnosis not present

## 2020-04-14 DIAGNOSIS — R279 Unspecified lack of coordination: Secondary | ICD-10-CM | POA: Diagnosis not present

## 2020-04-14 DIAGNOSIS — U071 COVID-19: Secondary | ICD-10-CM | POA: Diagnosis not present

## 2020-04-14 DIAGNOSIS — F445 Conversion disorder with seizures or convulsions: Secondary | ICD-10-CM | POA: Diagnosis not present

## 2020-04-14 DIAGNOSIS — R262 Difficulty in walking, not elsewhere classified: Secondary | ICD-10-CM | POA: Diagnosis not present

## 2020-04-14 DIAGNOSIS — G8194 Hemiplegia, unspecified affecting left nondominant side: Secondary | ICD-10-CM | POA: Diagnosis not present

## 2020-04-14 LAB — URINE CULTURE

## 2020-04-14 NOTE — Progress Notes (Signed)
Report called and given to Johnny Bridge, [facility] [facility]. All questions have been answered and no further questions at this time. Patient in stable condition and in no acute distress at this time. IV removed and IV site clean dry and intact. Patient transported via [facility].

## 2020-04-14 NOTE — TOC Transition Note (Signed)
Transition of Care [facility]) - CM/SW Discharge Note   Patient Details  Name: [name] MRN: [id number] Date of Birth: Feb 25, 1924  Transition [facility] Care [facility]) CM/SW Contact:  Ihor Gully, LCSW Phone Number: 04/14/2020, 10:37 AM   Clinical Narrative:    Discharge clinicals sent to facility on 12/29. TOC signing off.    Final next level of care: Skilled Nursing Facility Barriers to Discharge: No Barriers Identified   Patient Goals and CMS Choice Patient states their goals for this hospitalization and ongoing recovery are:: family is considering options   Choice offered to / list presented to : Adult Children  Discharge Placement              Patient chooses bed at: Other - please specify in the comment section below: ([facility]) Patient to be transferred to facility by: [place] Name of family member notified: Son, Eddie Dibbles Patient and family notified of of transfer: 04/14/20  Discharge Plan and Services                                     Social Determinants of Health (SDOH) Interventions     Readmission Risk Interventions Readmission Risk Prevention Plan 02/08/2020 01/07/2020  Transportation Screening Complete Complete  PCP or Specialist Appt within 5-7 Days - Complete  Home Care Screening - Complete  Medication Review (RN CM) - Complete  HRI or Home Care Consult Complete -  Social Work Consult for Recovery Care Planning/Counseling Complete -  Palliative Care Screening Complete -  Medication Review Press photographer) Complete -  Some recent data might be hidden

## 2020-04-14 NOTE — Care Management Important Message (Signed)
Important Message  Patient Details  Name: [name] MRN: [id number] Date of Birth: 1923/08/29   Medicare Important Message Given:  Yes - Important Message mailed due to current National Emergency     Tommy Medal 04/14/2020, 10:57 AM

## 2020-04-14 NOTE — Progress Notes (Signed)
Chart reviewed. No events overnight. Patient hemodynamically stable for discharge to SNF.  Please refer to discharge summary dictated on 04/12/2020 by Dr. Manuella Ghazi for further details and anticipated treatment management after discharge.  Barton Dubois MD [phone]

## 2020-04-15 DIAGNOSIS — U071 COVID-19: Secondary | ICD-10-CM | POA: Diagnosis not present

## 2020-04-15 DIAGNOSIS — J45909 Unspecified asthma, uncomplicated: Secondary | ICD-10-CM | POA: Diagnosis not present

## 2020-04-15 DIAGNOSIS — I10 Essential (primary) hypertension: Secondary | ICD-10-CM | POA: Diagnosis not present

## 2020-04-19 DIAGNOSIS — I639 Cerebral infarction, unspecified: Secondary | ICD-10-CM | POA: Diagnosis not present

## 2020-04-19 DIAGNOSIS — R262 Difficulty in walking, not elsewhere classified: Secondary | ICD-10-CM | POA: Diagnosis not present

## 2020-04-19 DIAGNOSIS — M6250 Muscle wasting and atrophy, not elsewhere classified, unspecified site: Secondary | ICD-10-CM | POA: Diagnosis not present

## 2020-04-19 DIAGNOSIS — M545 Low back pain, unspecified: Secondary | ICD-10-CM | POA: Diagnosis not present

## 2020-04-19 DIAGNOSIS — F445 Conversion disorder with seizures or convulsions: Secondary | ICD-10-CM | POA: Diagnosis not present

## 2020-04-19 DIAGNOSIS — Z8616 Personal history of COVID-19: Secondary | ICD-10-CM | POA: Diagnosis not present

## 2020-04-19 DIAGNOSIS — B370 Candidal stomatitis: Secondary | ICD-10-CM | POA: Diagnosis not present

## 2020-04-19 DIAGNOSIS — N183 Chronic kidney disease, stage 3 unspecified: Secondary | ICD-10-CM | POA: Diagnosis not present

## 2020-04-19 DIAGNOSIS — K219 Gastro-esophageal reflux disease without esophagitis: Secondary | ICD-10-CM | POA: Diagnosis not present

## 2020-04-19 DIAGNOSIS — J4540 Moderate persistent asthma, uncomplicated: Secondary | ICD-10-CM | POA: Diagnosis not present

## 2020-04-19 DIAGNOSIS — M1040 Other secondary gout, unspecified site: Secondary | ICD-10-CM | POA: Diagnosis not present

## 2020-04-19 DIAGNOSIS — U071 COVID-19: Secondary | ICD-10-CM | POA: Diagnosis not present

## 2020-04-19 DIAGNOSIS — M6281 Muscle weakness (generalized): Secondary | ICD-10-CM | POA: Diagnosis not present

## 2020-04-19 DIAGNOSIS — G40909 Epilepsy, unspecified, not intractable, without status epilepticus: Secondary | ICD-10-CM | POA: Diagnosis not present

## 2020-04-19 DIAGNOSIS — K5909 Other constipation: Secondary | ICD-10-CM | POA: Diagnosis not present

## 2020-04-21 DIAGNOSIS — M6281 Muscle weakness (generalized): Secondary | ICD-10-CM | POA: Diagnosis not present

## 2020-04-21 DIAGNOSIS — M6250 Muscle wasting and atrophy, not elsewhere classified, unspecified site: Secondary | ICD-10-CM | POA: Diagnosis not present

## 2020-04-21 DIAGNOSIS — M545 Low back pain, unspecified: Secondary | ICD-10-CM | POA: Diagnosis not present

## 2020-04-21 DIAGNOSIS — M1040 Other secondary gout, unspecified site: Secondary | ICD-10-CM | POA: Diagnosis not present

## 2020-04-21 DIAGNOSIS — F445 Conversion disorder with seizures or convulsions: Secondary | ICD-10-CM | POA: Diagnosis not present

## 2020-04-21 DIAGNOSIS — R262 Difficulty in walking, not elsewhere classified: Secondary | ICD-10-CM | POA: Diagnosis not present

## 2020-04-21 DIAGNOSIS — Z8616 Personal history of COVID-19: Secondary | ICD-10-CM | POA: Diagnosis not present

## 2020-04-26 DIAGNOSIS — R262 Difficulty in walking, not elsewhere classified: Secondary | ICD-10-CM | POA: Diagnosis not present

## 2020-04-26 DIAGNOSIS — M1040 Other secondary gout, unspecified site: Secondary | ICD-10-CM | POA: Diagnosis not present

## 2020-04-26 DIAGNOSIS — M545 Low back pain, unspecified: Secondary | ICD-10-CM | POA: Diagnosis not present

## 2020-04-26 DIAGNOSIS — Z8616 Personal history of COVID-19: Secondary | ICD-10-CM | POA: Diagnosis not present

## 2020-04-26 DIAGNOSIS — M6281 Muscle weakness (generalized): Secondary | ICD-10-CM | POA: Diagnosis not present

## 2020-04-26 DIAGNOSIS — M6250 Muscle wasting and atrophy, not elsewhere classified, unspecified site: Secondary | ICD-10-CM | POA: Diagnosis not present

## 2020-04-26 DIAGNOSIS — F445 Conversion disorder with seizures or convulsions: Secondary | ICD-10-CM | POA: Diagnosis not present

## 2020-04-28 DIAGNOSIS — M6281 Muscle weakness (generalized): Secondary | ICD-10-CM | POA: Diagnosis not present

## 2020-04-28 DIAGNOSIS — M1040 Other secondary gout, unspecified site: Secondary | ICD-10-CM | POA: Diagnosis not present

## 2020-04-28 DIAGNOSIS — B370 Candidal stomatitis: Secondary | ICD-10-CM | POA: Diagnosis not present

## 2020-04-28 DIAGNOSIS — R262 Difficulty in walking, not elsewhere classified: Secondary | ICD-10-CM | POA: Diagnosis not present

## 2020-04-28 DIAGNOSIS — M6250 Muscle wasting and atrophy, not elsewhere classified, unspecified site: Secondary | ICD-10-CM | POA: Diagnosis not present

## 2020-04-28 DIAGNOSIS — Z8616 Personal history of COVID-19: Secondary | ICD-10-CM | POA: Diagnosis not present

## 2020-04-28 DIAGNOSIS — F445 Conversion disorder with seizures or convulsions: Secondary | ICD-10-CM | POA: Diagnosis not present

## 2020-04-28 DIAGNOSIS — M545 Low back pain, unspecified: Secondary | ICD-10-CM | POA: Diagnosis not present

## 2020-05-01 DIAGNOSIS — M6281 Muscle weakness (generalized): Secondary | ICD-10-CM | POA: Diagnosis not present

## 2020-05-01 DIAGNOSIS — R278 Other lack of coordination: Secondary | ICD-10-CM | POA: Diagnosis not present

## 2020-05-01 DIAGNOSIS — R262 Difficulty in walking, not elsewhere classified: Secondary | ICD-10-CM | POA: Diagnosis not present

## 2020-05-01 DIAGNOSIS — U071 COVID-19: Secondary | ICD-10-CM | POA: Diagnosis not present

## 2020-05-02 DIAGNOSIS — R262 Difficulty in walking, not elsewhere classified: Secondary | ICD-10-CM | POA: Diagnosis not present

## 2020-05-02 DIAGNOSIS — R4182 Altered mental status, unspecified: Secondary | ICD-10-CM | POA: Diagnosis not present

## 2020-05-02 DIAGNOSIS — M6281 Muscle weakness (generalized): Secondary | ICD-10-CM | POA: Diagnosis not present

## 2020-05-02 DIAGNOSIS — R278 Other lack of coordination: Secondary | ICD-10-CM | POA: Diagnosis not present

## 2020-05-02 DIAGNOSIS — U071 COVID-19: Secondary | ICD-10-CM | POA: Diagnosis not present

## 2020-05-03 DIAGNOSIS — M6281 Muscle weakness (generalized): Secondary | ICD-10-CM | POA: Diagnosis not present

## 2020-05-03 DIAGNOSIS — R278 Other lack of coordination: Secondary | ICD-10-CM | POA: Diagnosis not present

## 2020-05-03 DIAGNOSIS — M6250 Muscle wasting and atrophy, not elsewhere classified, unspecified site: Secondary | ICD-10-CM | POA: Diagnosis not present

## 2020-05-03 DIAGNOSIS — U071 COVID-19: Secondary | ICD-10-CM | POA: Diagnosis not present

## 2020-05-03 DIAGNOSIS — R4182 Altered mental status, unspecified: Secondary | ICD-10-CM | POA: Diagnosis not present

## 2020-05-03 DIAGNOSIS — R262 Difficulty in walking, not elsewhere classified: Secondary | ICD-10-CM | POA: Diagnosis not present

## 2020-05-03 DIAGNOSIS — M1040 Other secondary gout, unspecified site: Secondary | ICD-10-CM | POA: Diagnosis not present

## 2020-05-03 DIAGNOSIS — F445 Conversion disorder with seizures or convulsions: Secondary | ICD-10-CM | POA: Diagnosis not present

## 2020-05-03 DIAGNOSIS — Z8616 Personal history of COVID-19: Secondary | ICD-10-CM | POA: Diagnosis not present

## 2020-05-03 DIAGNOSIS — M545 Low back pain, unspecified: Secondary | ICD-10-CM | POA: Diagnosis not present

## 2020-05-04 DIAGNOSIS — J189 Pneumonia, unspecified organism: Secondary | ICD-10-CM | POA: Diagnosis not present

## 2020-05-04 DIAGNOSIS — I361 Nonrheumatic tricuspid (valve) insufficiency: Secondary | ICD-10-CM | POA: Diagnosis not present

## 2020-05-04 DIAGNOSIS — R059 Cough, unspecified: Secondary | ICD-10-CM | POA: Diagnosis not present

## 2020-05-04 DIAGNOSIS — I272 Pulmonary hypertension, unspecified: Secondary | ICD-10-CM | POA: Diagnosis not present

## 2020-05-04 DIAGNOSIS — M6281 Muscle weakness (generalized): Secondary | ICD-10-CM | POA: Diagnosis not present

## 2020-05-04 DIAGNOSIS — G40909 Epilepsy, unspecified, not intractable, without status epilepticus: Secondary | ICD-10-CM | POA: Diagnosis not present

## 2020-05-04 DIAGNOSIS — R278 Other lack of coordination: Secondary | ICD-10-CM | POA: Diagnosis not present

## 2020-05-04 DIAGNOSIS — Z130 Encounter for screening for diseases of the blood and blood-forming organs and certain disorders involving the immune mechanism: Secondary | ICD-10-CM | POA: Diagnosis not present

## 2020-05-04 DIAGNOSIS — Z9981 Dependence on supplemental oxygen: Secondary | ICD-10-CM | POA: Diagnosis not present

## 2020-05-04 DIAGNOSIS — R4182 Altered mental status, unspecified: Secondary | ICD-10-CM | POA: Diagnosis not present

## 2020-05-04 DIAGNOSIS — J1282 Pneumonia due to coronavirus disease 2019: Secondary | ICD-10-CM | POA: Diagnosis not present

## 2020-05-04 DIAGNOSIS — I959 Hypotension, unspecified: Secondary | ICD-10-CM | POA: Diagnosis not present

## 2020-05-04 DIAGNOSIS — F445 Conversion disorder with seizures or convulsions: Secondary | ICD-10-CM | POA: Diagnosis not present

## 2020-05-04 DIAGNOSIS — I2699 Other pulmonary embolism without acute cor pulmonale: Secondary | ICD-10-CM | POA: Diagnosis not present

## 2020-05-04 DIAGNOSIS — R0602 Shortness of breath: Secondary | ICD-10-CM | POA: Diagnosis not present

## 2020-05-04 DIAGNOSIS — R262 Difficulty in walking, not elsewhere classified: Secondary | ICD-10-CM | POA: Diagnosis not present

## 2020-05-04 DIAGNOSIS — I4891 Unspecified atrial fibrillation: Secondary | ICD-10-CM | POA: Diagnosis not present

## 2020-05-04 DIAGNOSIS — M545 Low back pain, unspecified: Secondary | ICD-10-CM | POA: Diagnosis not present

## 2020-05-04 DIAGNOSIS — R000 Tachycardia, unspecified: Secondary | ICD-10-CM | POA: Diagnosis not present

## 2020-05-04 DIAGNOSIS — A4189 Other specified sepsis: Secondary | ICD-10-CM | POA: Diagnosis not present

## 2020-05-04 DIAGNOSIS — M6250 Muscle wasting and atrophy, not elsewhere classified, unspecified site: Secondary | ICD-10-CM | POA: Diagnosis not present

## 2020-05-04 DIAGNOSIS — U071 COVID-19: Secondary | ICD-10-CM | POA: Diagnosis not present

## 2020-05-04 DIAGNOSIS — J90 Pleural effusion, not elsewhere classified: Secondary | ICD-10-CM | POA: Diagnosis not present

## 2020-05-04 DIAGNOSIS — Z8616 Personal history of COVID-19: Secondary | ICD-10-CM | POA: Diagnosis not present

## 2020-05-04 DIAGNOSIS — R54 Age-related physical debility: Secondary | ICD-10-CM | POA: Diagnosis not present

## 2020-05-04 DIAGNOSIS — J9601 Acute respiratory failure with hypoxia: Secondary | ICD-10-CM | POA: Diagnosis not present

## 2020-05-04 DIAGNOSIS — A419 Sepsis, unspecified organism: Secondary | ICD-10-CM | POA: Diagnosis not present

## 2020-05-04 DIAGNOSIS — M1040 Other secondary gout, unspecified site: Secondary | ICD-10-CM | POA: Diagnosis not present

## 2020-05-04 DIAGNOSIS — N3000 Acute cystitis without hematuria: Secondary | ICD-10-CM | POA: Diagnosis not present

## 2020-05-04 DIAGNOSIS — I6782 Cerebral ischemia: Secondary | ICD-10-CM | POA: Diagnosis not present

## 2020-05-04 DIAGNOSIS — D631 Anemia in chronic kidney disease: Secondary | ICD-10-CM | POA: Diagnosis not present

## 2020-05-04 DIAGNOSIS — D75839 Thrombocytosis, unspecified: Secondary | ICD-10-CM | POA: Diagnosis not present

## 2020-05-17 DEATH — deceased

## 2020-06-06 ENCOUNTER — Ambulatory Visit: Payer: Medicare PPO | Admitting: Physician Assistant

## 2021-11-21 IMAGING — DX DG CHEST 1V PORT
1 series · 1 of 1 positions shown · non-contrast
Comparison: CTA chest and chest x-ray dated March 12, 2019.

CLINICAL DATA: Weakness and tachycardia.

EXAM:
PORTABLE CHEST 1 VIEW

[chest ap]
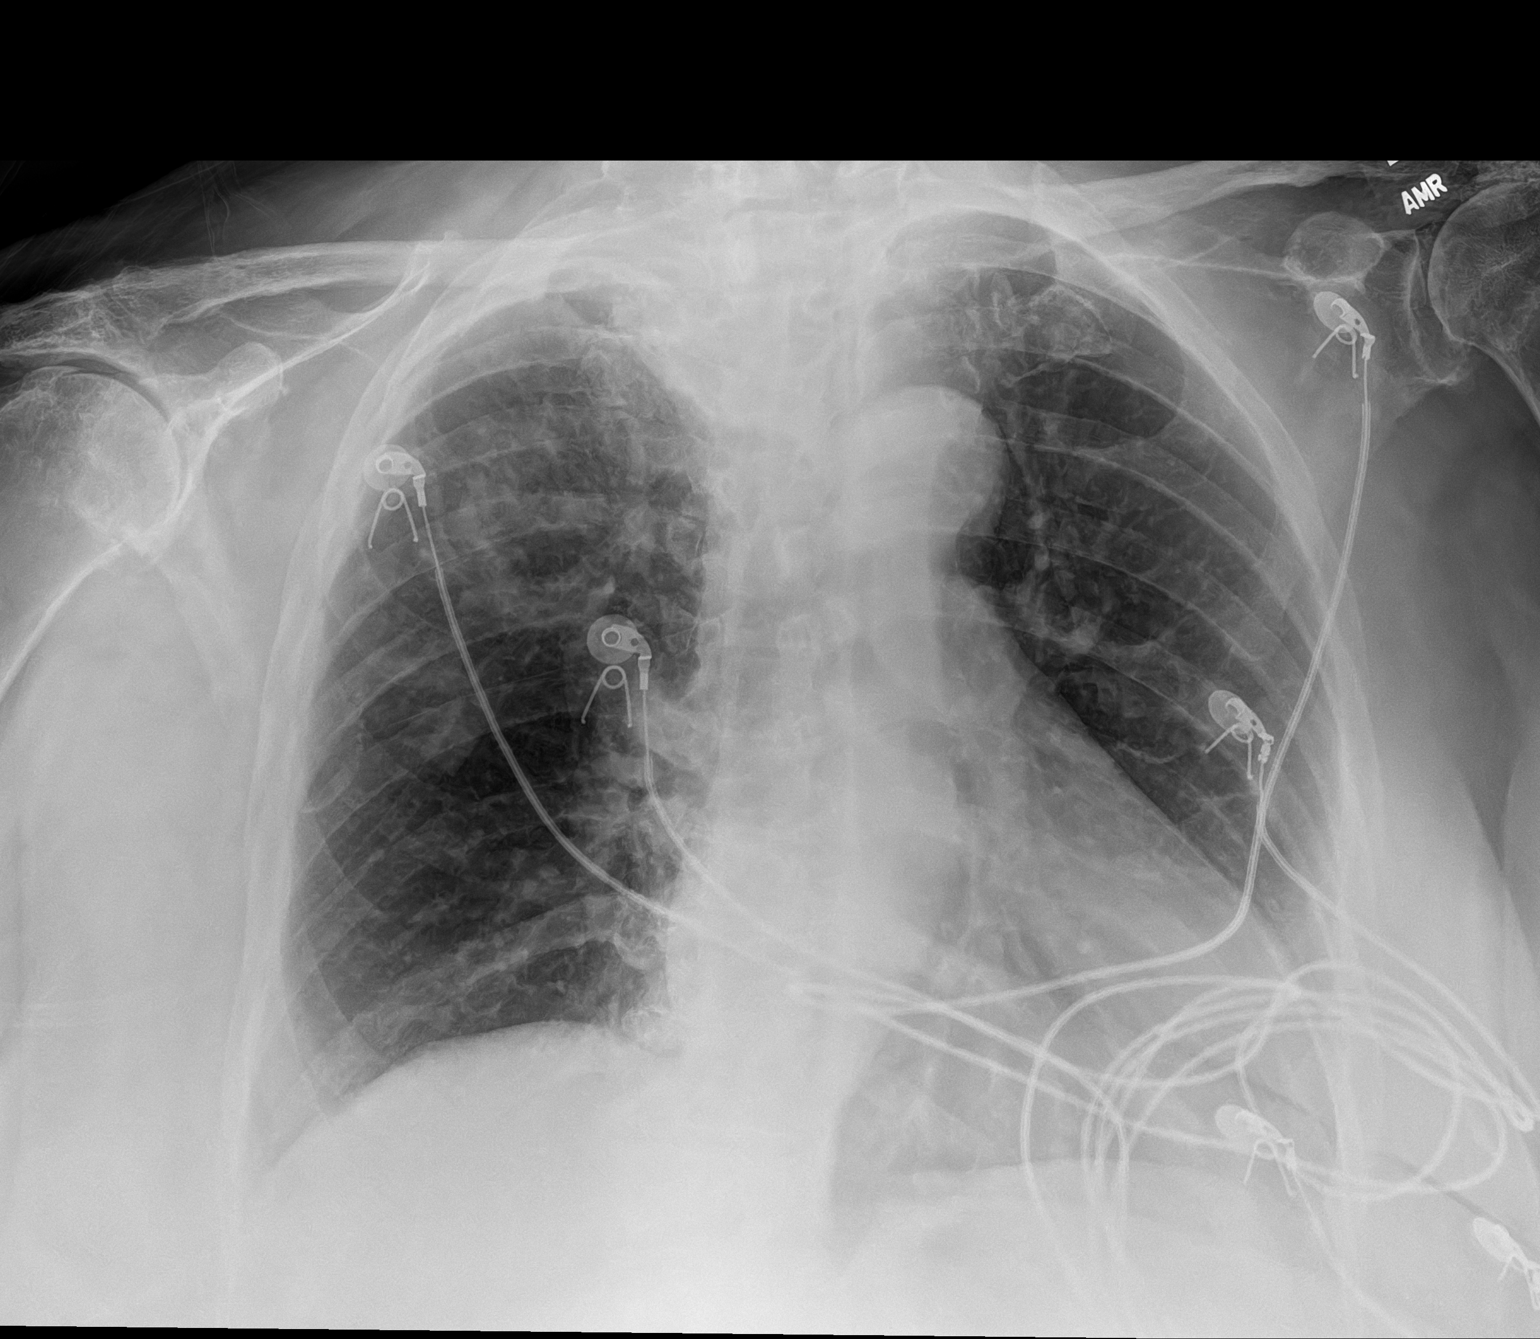

[1 of 1 positions shown; findings below may reference images not displayed]

FINDINGS: The heart size and mediastinal contours are within normal limits.
Normal pulmonary vascularity. New 5.3 cm lesion in the right upper
lobe with central lucency, suggestive of cavitation. No pleural
effusion or pneumothorax. No acute osseous abnormality.
IMPRESSION: 1. Suspected new 5.3 cm cavitary lesion in the right upper lobe. CT
of the chest with contrast is recommended for further evaluation.

## 2021-12-25 IMAGING — DX DG CHEST 2V
2 series · 2 of 2 positions shown · non-contrast
Comparison: 02/03/2020

CLINICAL DATA: Pneumonia

EXAM:
CHEST - 2 VIEW

[chest ap]
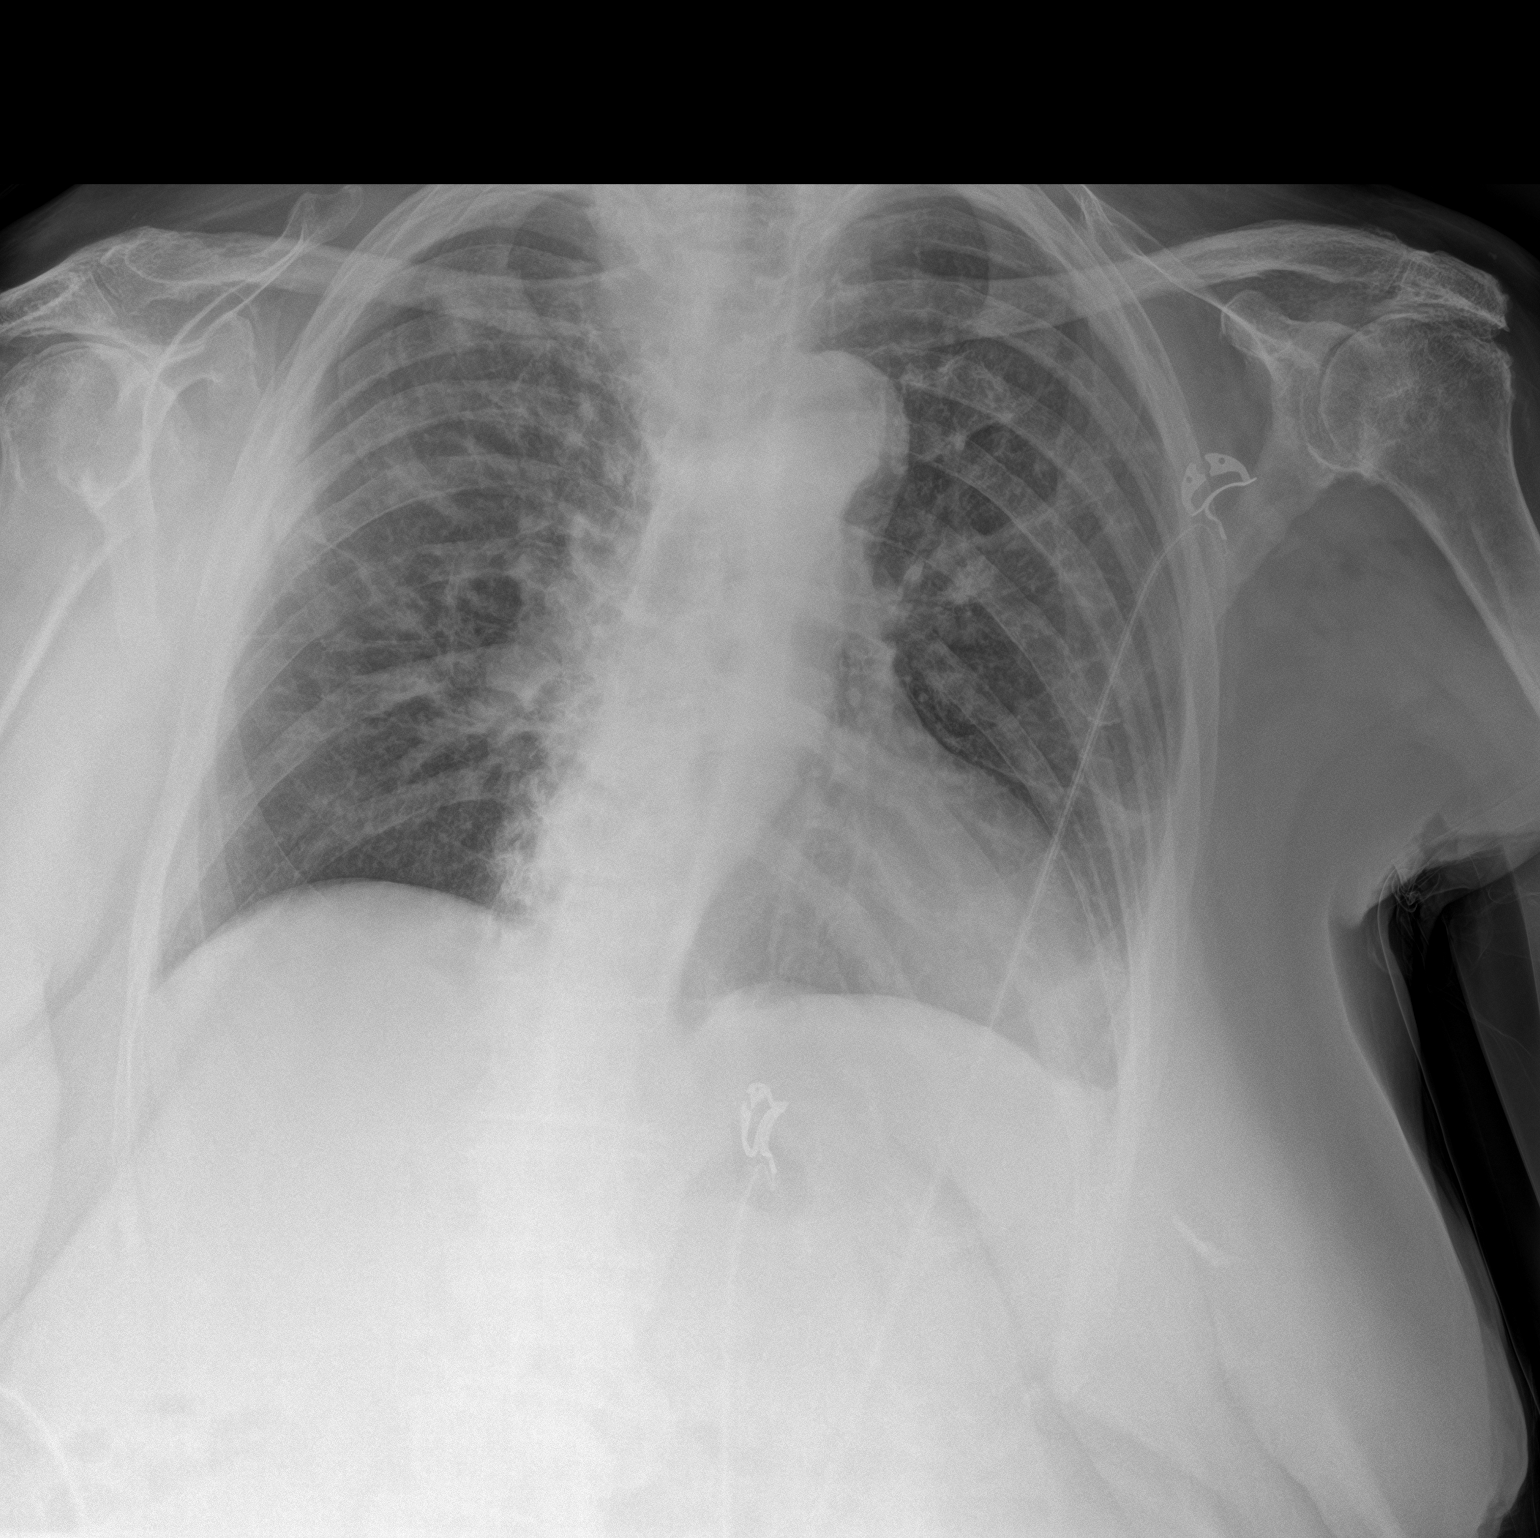

[chest lat]
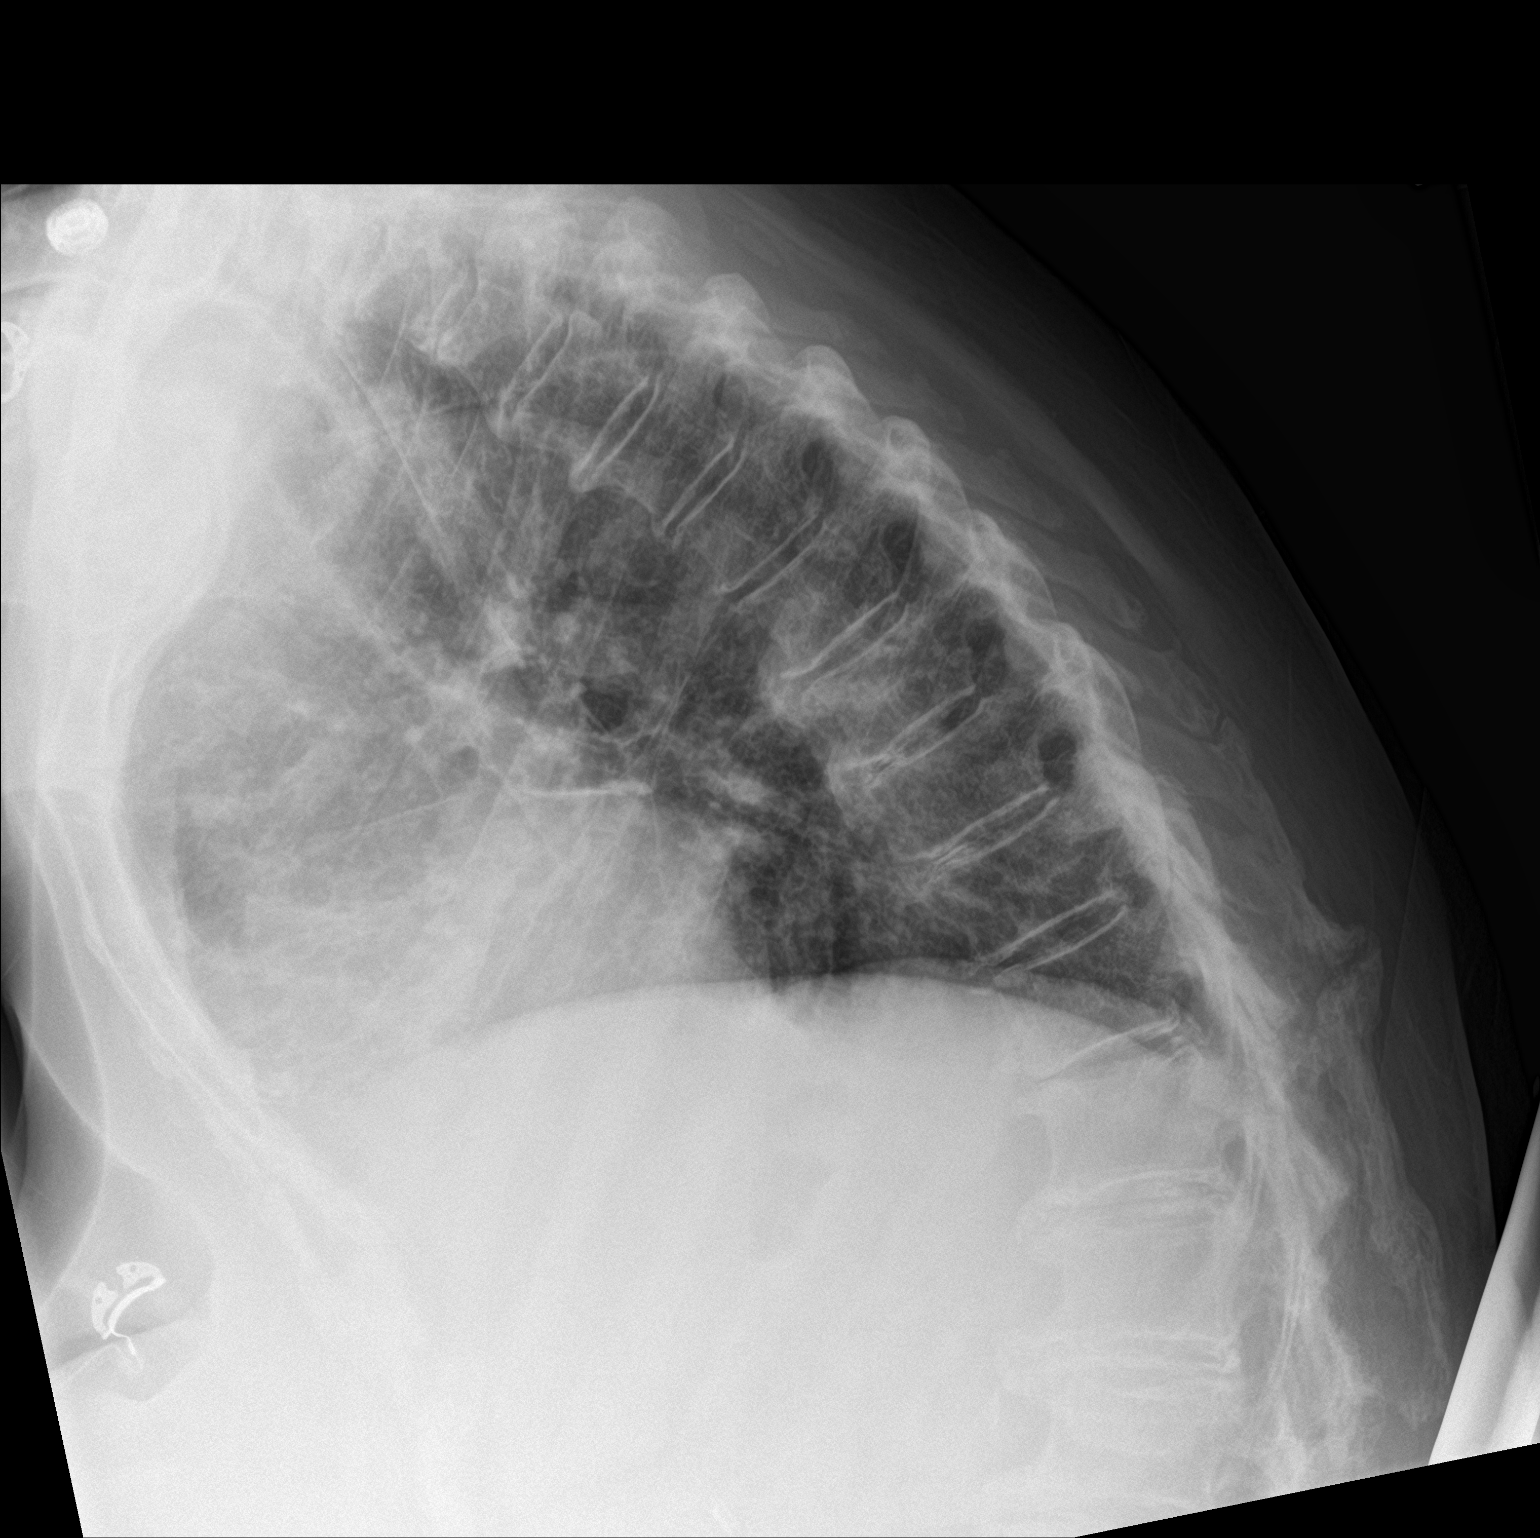

[2 of 2 positions shown; findings below may reference images not displayed]

FINDINGS: The cardiomediastinal silhouette is unchanged.

Mild pulmonary vascular congestion is noted.

Decreased bilateral LOWER lung opacities since the prior study.

No pneumothorax or large pleural effusion.

No acute bony abnormalities.
IMPRESSION: Improved bilateral LOWER lung opacities/pneumonia.

## 2022-02-22 IMAGING — US US EXTREM LOW VENOUS*L*
1 series · 14 of 24 positions shown · non-contrast
Comparison: None.

CLINICAL DATA: Weakness

EXAM:
LEFT LOWER EXTREMITY VENOUS DOPPLER ULTRASOUND
TECHNIQUE: Gray-scale sonography with compression, as well as color and duplex
ultrasound, were performed to evaluate the deep venous system(s)
from the level of the common femoral vein through the popliteal and
proximal calf veins.

[Series 1: us venous img lower uni left (dvt) · portal-venous · 14 of 35 slices shown]
[im 1/35]
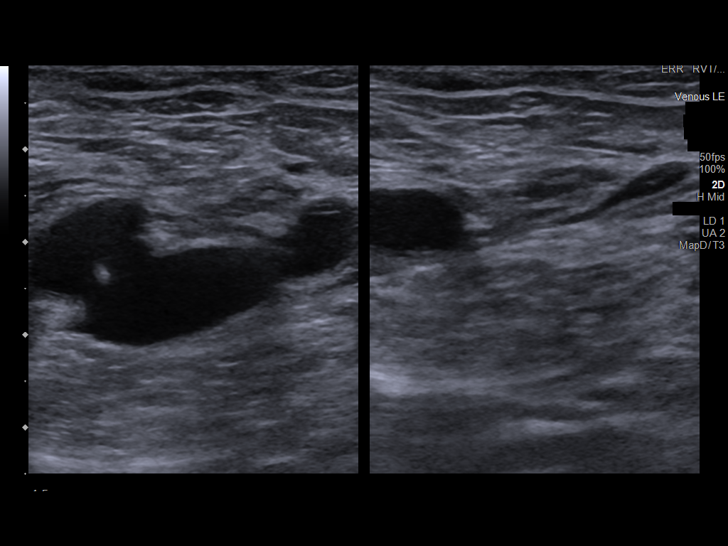
[im 3/35]
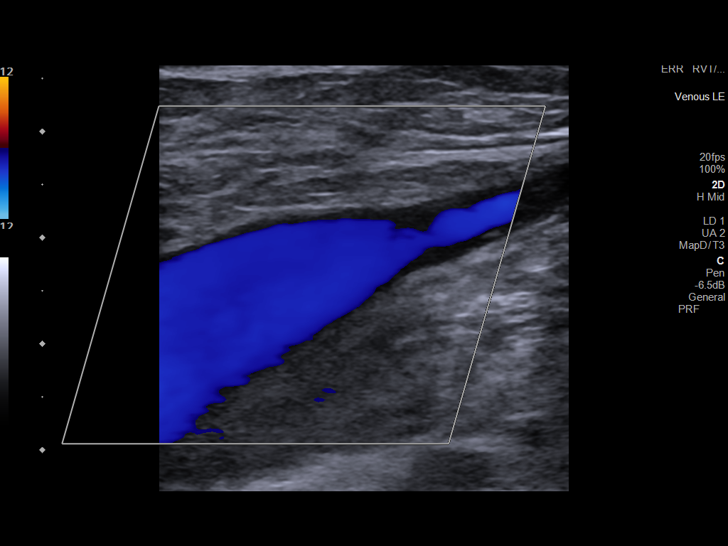
[im 6/35]
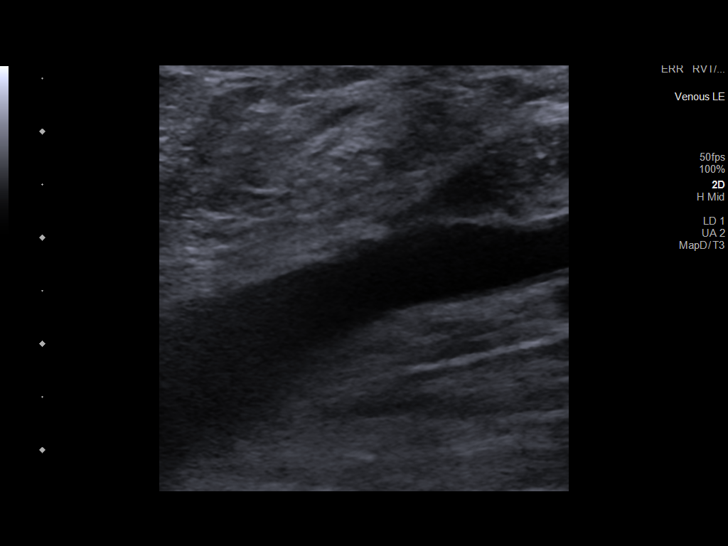
[im 9/35]
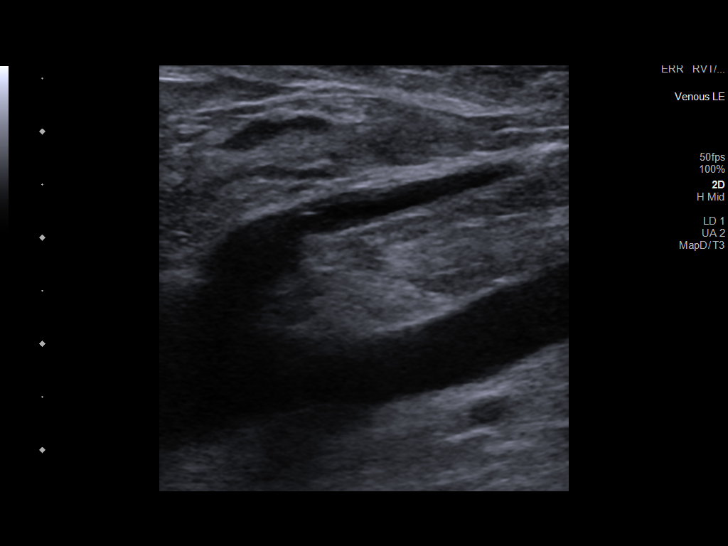
[im 11/35]
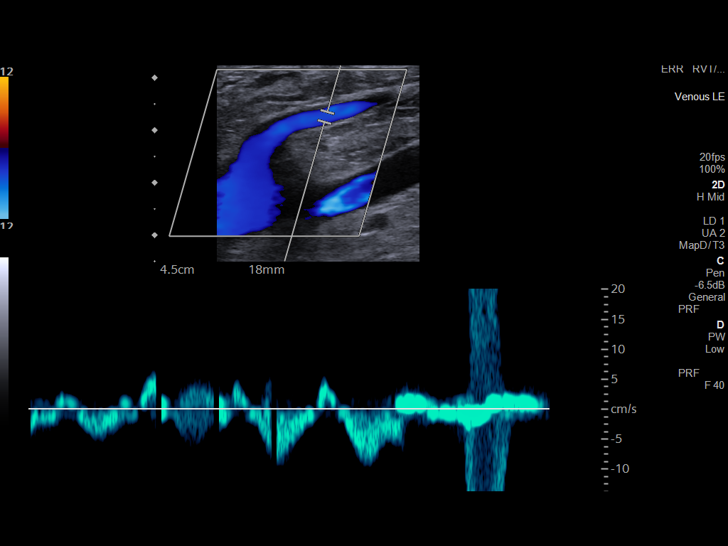
[im 14/35]
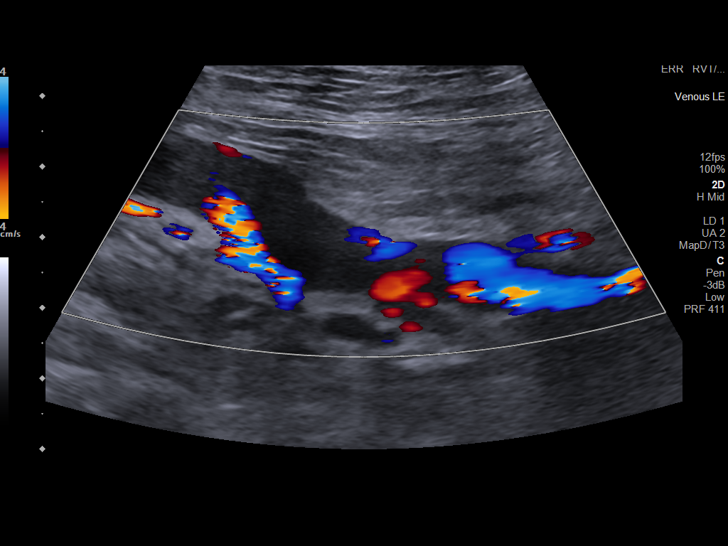
[im 17/35]
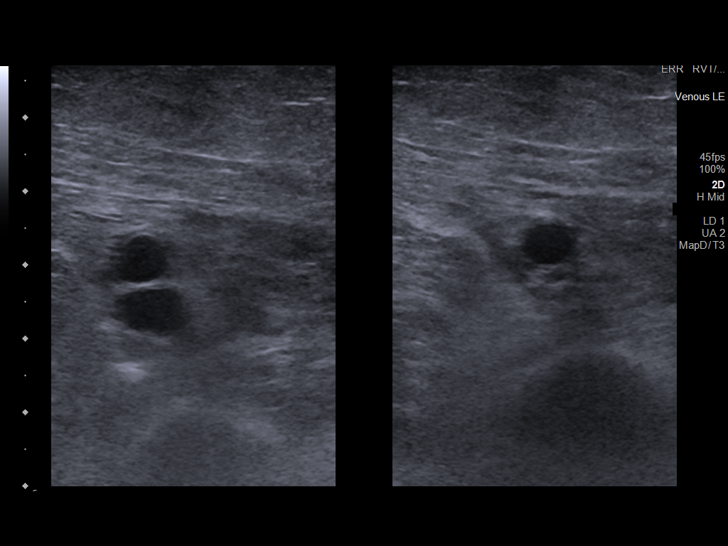
[im 18/35]
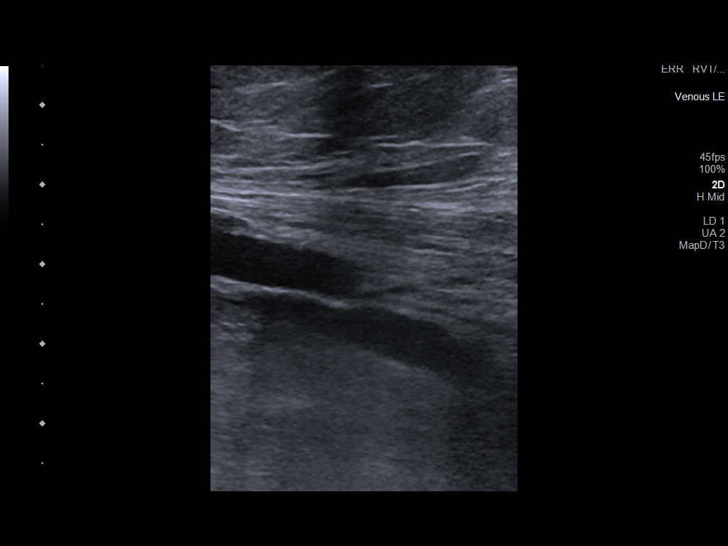
[im 21/35]
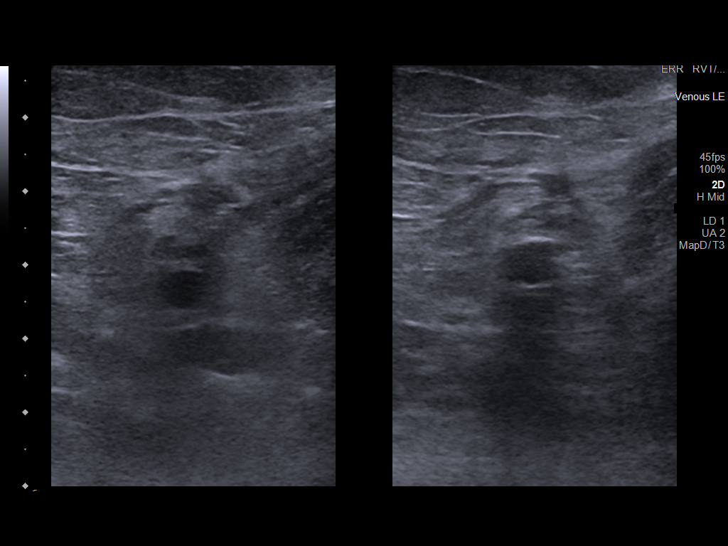
[im 24/35]
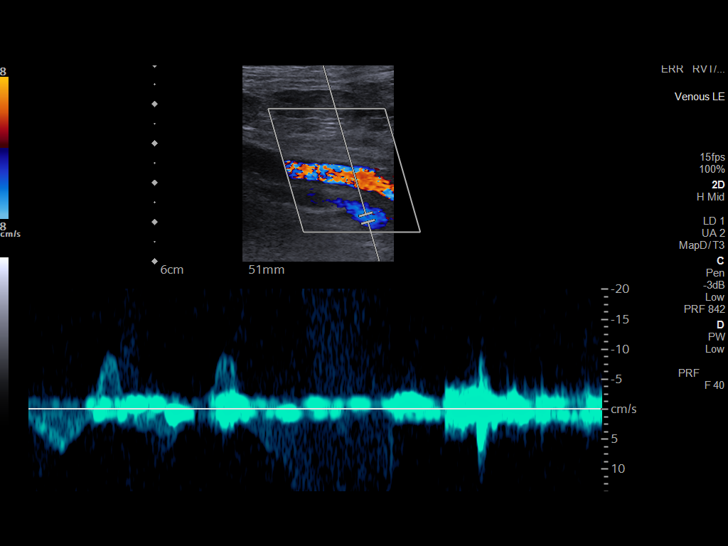
[im 27/35]
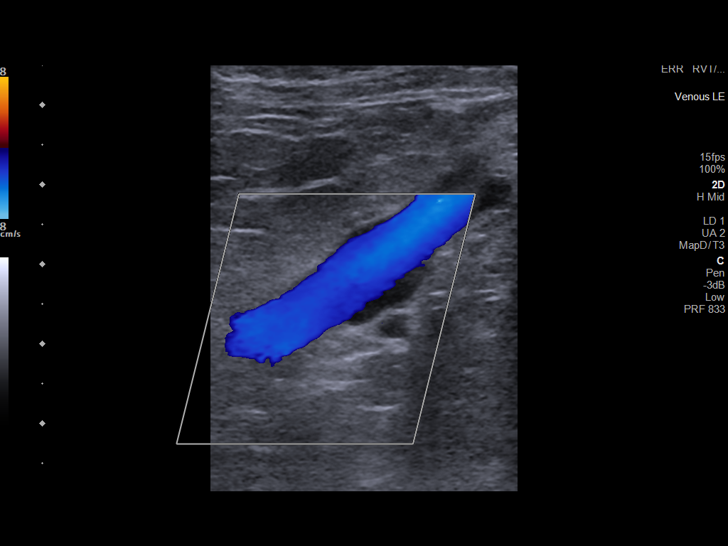
[im 29/35]
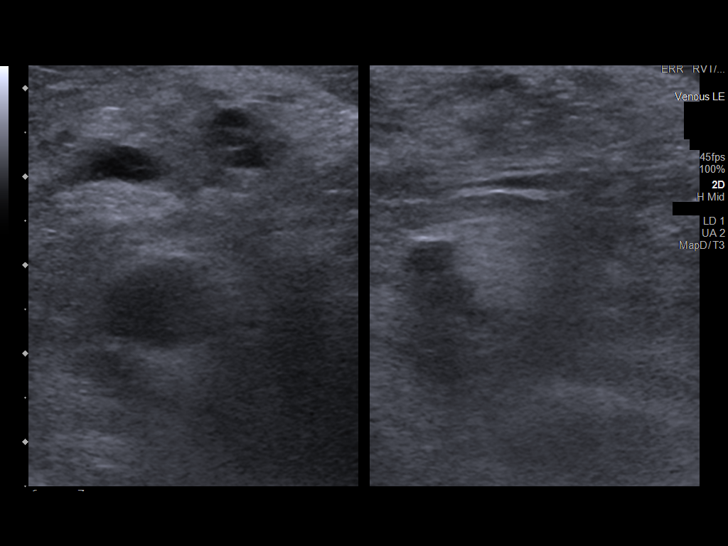
[im 32/35]
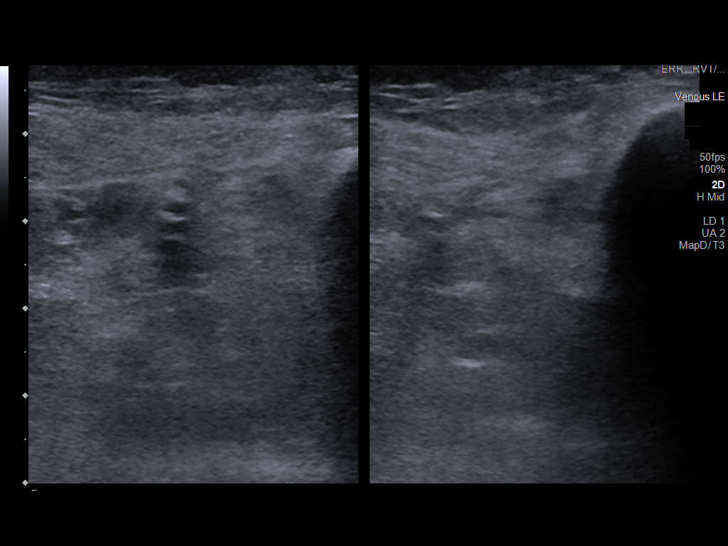
[im 35/35]
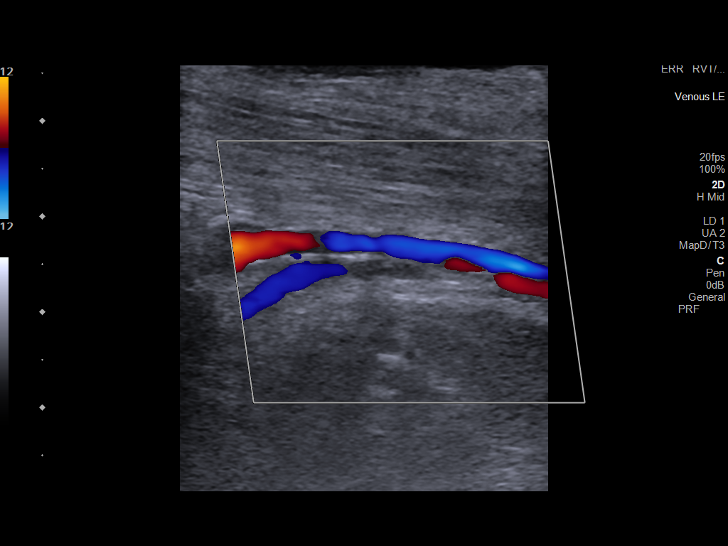

[14 of 24 positions shown; findings below may reference images not displayed]

FINDINGS: VENOUS

Normal compressibility of the common femoral, superficial femoral,
and popliteal veins, as well as the visualized calf veins.
Visualized portions of profunda femoral vein and great saphenous
vein unremarkable. No filling defects to suggest DVT on grayscale or
color Doppler imaging. Doppler waveforms show normal direction of
venous flow, normal respiratory plasticity and response to
augmentation.

Limited views of the contralateral common femoral vein are
unremarkable.

OTHER

None.

Limitations: none
IMPRESSION: Negative.

## 2022-02-23 IMAGING — US US CAROTID DUPLEX BILAT
1 series · 13 of 24 positions shown · non-contrast
Comparison: None.

CLINICAL DATA: Left-sided weakness.  TIA.  History of hypertension

EXAM:
BILATERAL CAROTID DUPLEX ULTRASOUND
TECHNIQUE: Gray scale imaging, color Doppler and duplex ultrasound were
performed of bilateral carotid and vertebral arteries in the neck.

[Series 1: us carotid duplex bilat · 0.06mm/px · 13 of 70 slices shown]
[im 1/70]
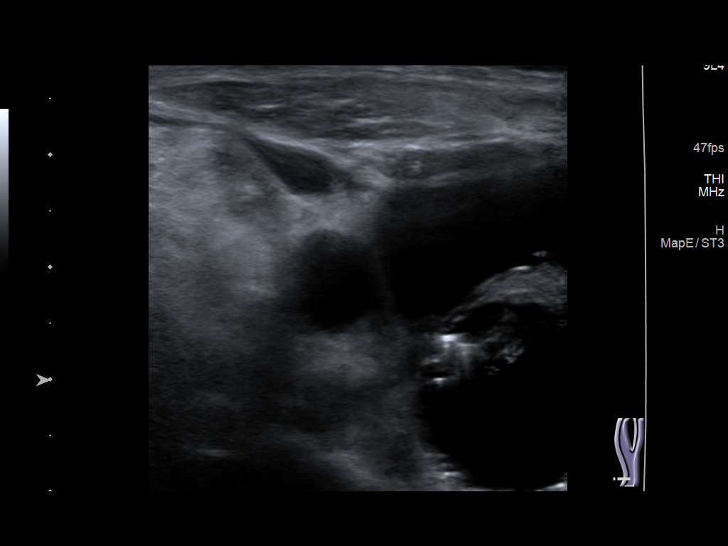
[im 7/70]
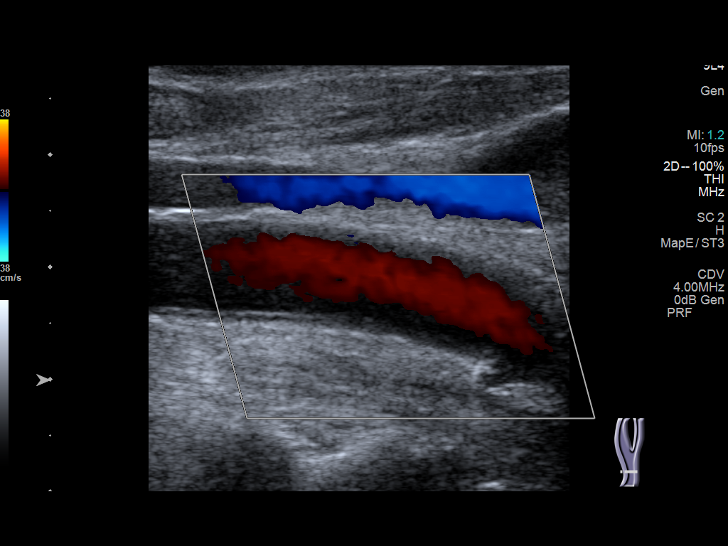
[im 13/70]
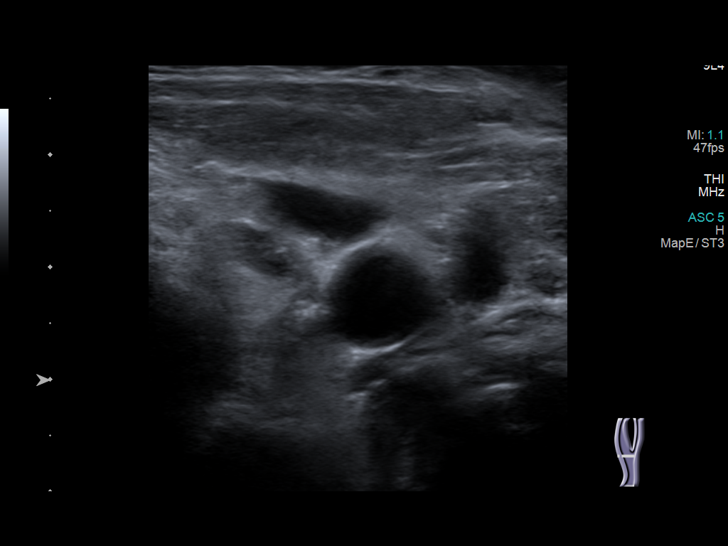
[im 19/70]
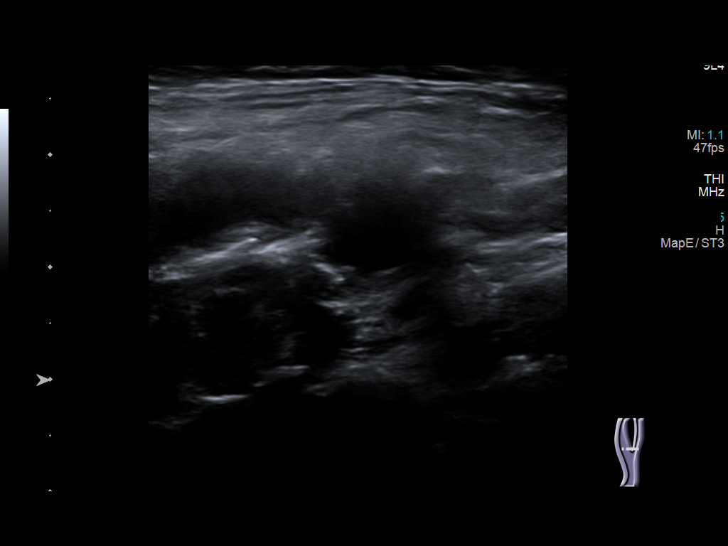
[im 25/70]
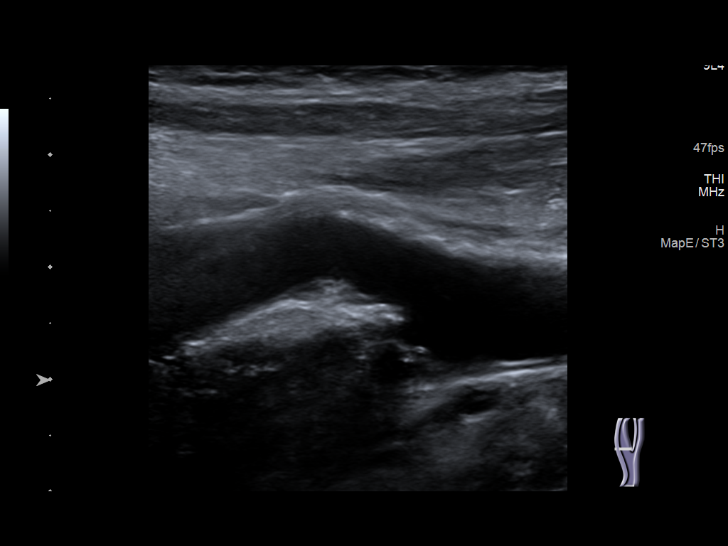
[im 31/70]
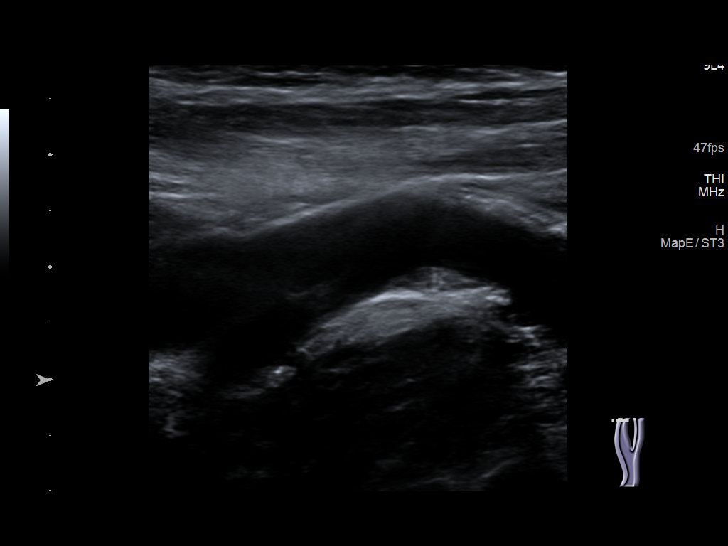
[im 37/70]
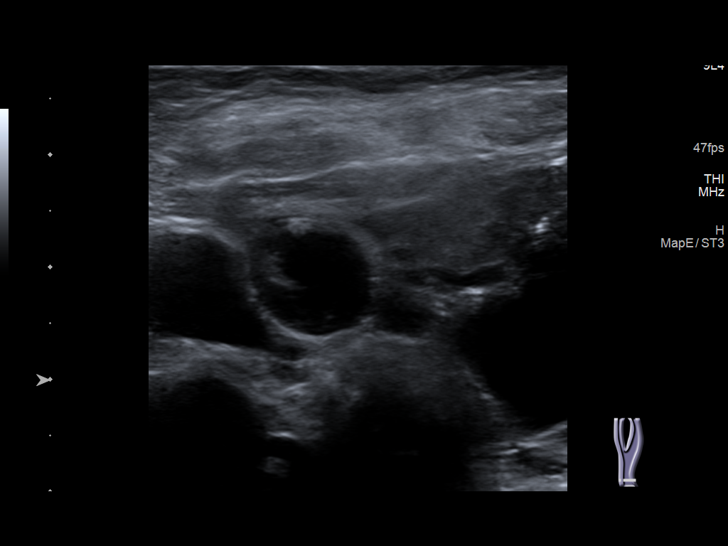
[im 40/70]
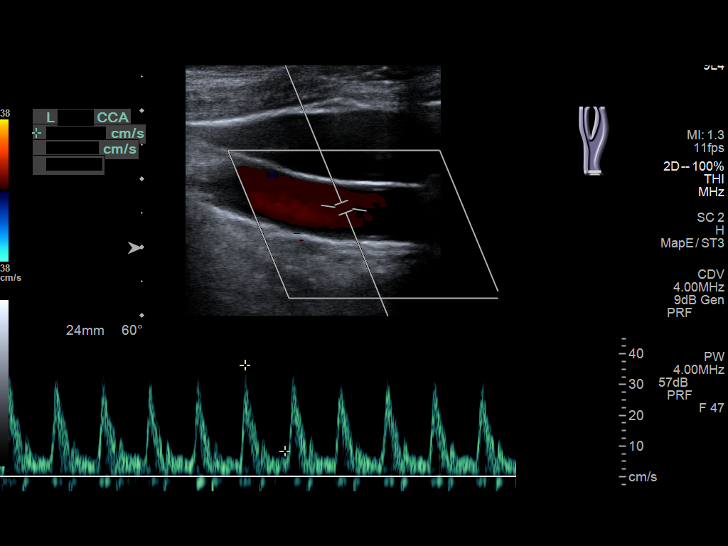
[im 46/70]
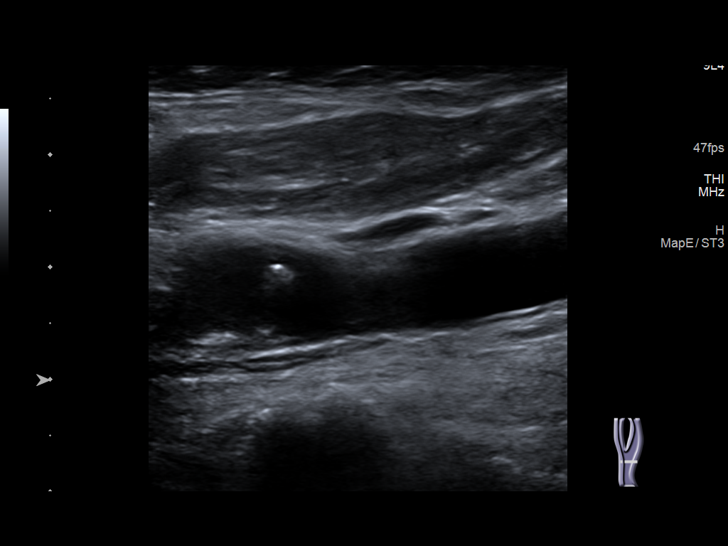
[im 52/70]
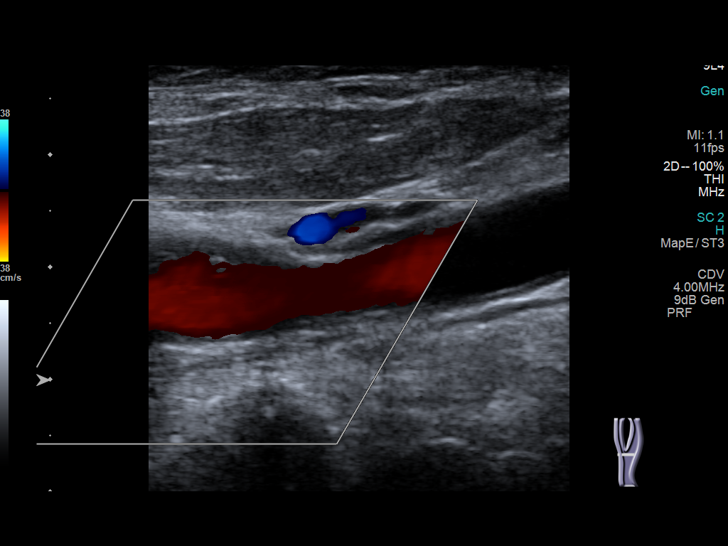
[im 58/70]
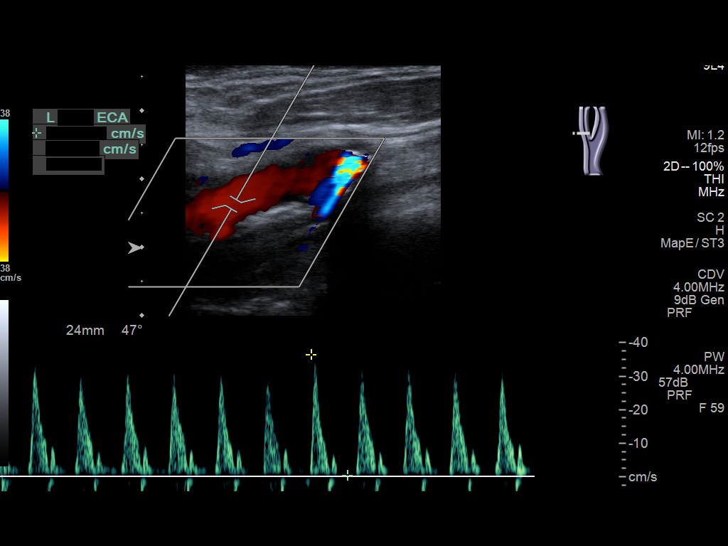
[im 64/70]
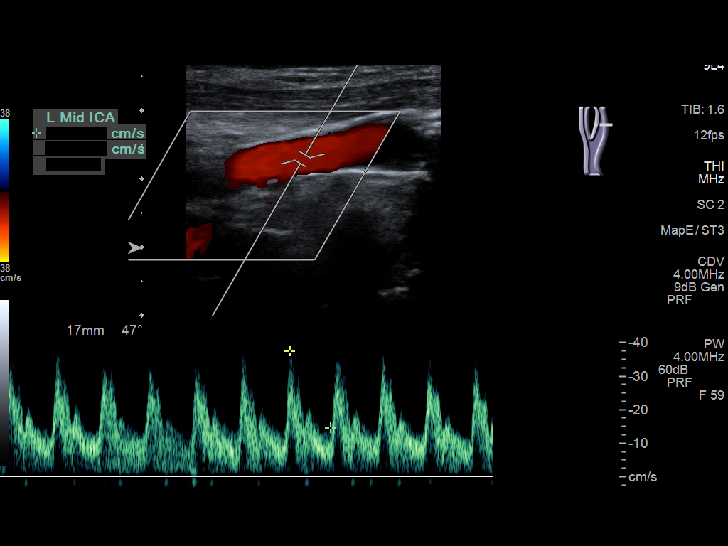
[im 70/70]
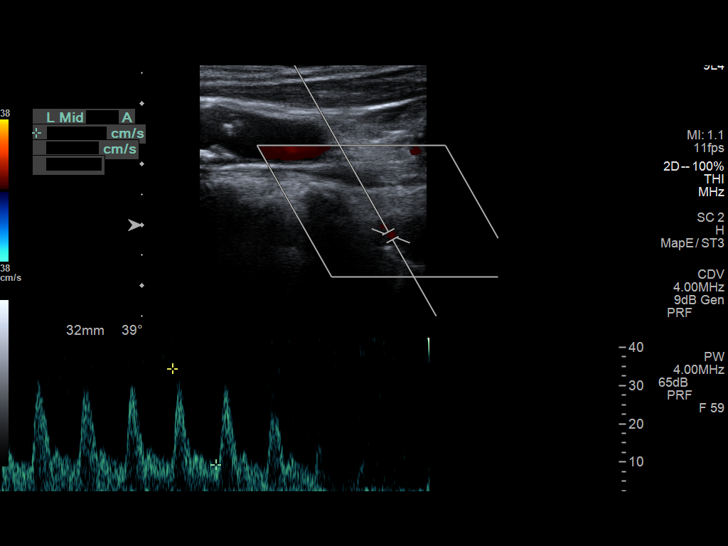

[13 of 24 positions shown; findings below may reference images not displayed]

FINDINGS: Criteria: Quantification of carotid stenosis is based on velocity
parameters that correlate the residual internal carotid diameter
with NASCET-based stenosis levels, using the diameter of the distal
internal carotid lumen as the denominator for stenosis measurement.

The following velocity measurements were obtained:

RIGHT

ICA: 44/16 cm/sec

CCA: 45/13 cm/sec

SYSTOLIC ICA/CCA RATIO:

ECA: 27 cm/sec

LEFT

ICA: 49/17 cm/sec

CCA: 41/12 cm/sec

SYSTOLIC ICA/CCA RATIO:

ECA: 37 cm/sec

RIGHT CAROTID ARTERY: There is a minimal amount of intimal
thickening/atherosclerotic plaque involving the right carotid bulb,
extending to involve the origin and proximal aspects of the right
internal carotid artery (image 26), not resulting in elevated peak
systolic velocities within the interrogated course of the right
internal carotid artery to suggest a hemodynamically significant
stenosis.

RIGHT VERTEBRAL ARTERY:  Antegrade flow

LEFT CAROTID ARTERY: There is a moderate amount of eccentric
echogenic plaque within the left carotid bulb (images 50 and 52),
extending to involve the origin and proximal aspects of the left
internal carotid artery (image 60), not resulting in elevated peak
systolic velocities within the interrogated course of the left
internal carotid artery to suggest a hemodynamically significant
stenosis.

LEFT VERTEBRAL ARTERY:  Antegrade flow
IMPRESSION: Minimal to moderate amount of bilateral atherosclerotic plaque, left
greater than right, not resulting in a hemodynamically significant
stenosis within either internal carotid artery.
